# Patient Record
Sex: Male | Born: 1950 | Race: White | Hispanic: No | Marital: Single | Smoking: Former smoker
Health system: Southern US, Community
[De-identification: ages and names within clinical notes are randomized; demographics above are authoritative.]

## PROBLEM LIST (undated history)

## (undated) ENCOUNTER — Emergency Department (HOSPITAL_COMMUNITY): Payer: Self-pay

## (undated) DIAGNOSIS — I639 Cerebral infarction, unspecified: Secondary | ICD-10-CM

## (undated) DIAGNOSIS — IMO0002 Reserved for concepts with insufficient information to code with codable children: Secondary | ICD-10-CM

## (undated) DIAGNOSIS — I10 Essential (primary) hypertension: Secondary | ICD-10-CM

## (undated) DIAGNOSIS — E7800 Pure hypercholesterolemia, unspecified: Secondary | ICD-10-CM

## (undated) DIAGNOSIS — I739 Peripheral vascular disease, unspecified: Secondary | ICD-10-CM

## (undated) DIAGNOSIS — N189 Chronic kidney disease, unspecified: Secondary | ICD-10-CM

## (undated) DIAGNOSIS — M792 Neuralgia and neuritis, unspecified: Secondary | ICD-10-CM

## (undated) DIAGNOSIS — M1990 Unspecified osteoarthritis, unspecified site: Secondary | ICD-10-CM

## (undated) DIAGNOSIS — L409 Psoriasis, unspecified: Secondary | ICD-10-CM

## (undated) DIAGNOSIS — G709 Myoneural disorder, unspecified: Secondary | ICD-10-CM

## (undated) DIAGNOSIS — E119 Type 2 diabetes mellitus without complications: Secondary | ICD-10-CM

## (undated) HISTORY — DX: Unspecified osteoarthritis, unspecified site: M19.90

## (undated) HISTORY — PX: POPLITEAL ARTERY STENT: SHX2243

## (undated) HISTORY — DX: Essential (primary) hypertension: I10

## (undated) HISTORY — DX: Reserved for concepts with insufficient information to code with codable children: IMO0002

## (undated) HISTORY — DX: Neuralgia and neuritis, unspecified: M79.2

## (undated) HISTORY — PX: TONSILLECTOMY AND ADENOIDECTOMY: SUR1326

## (undated) HISTORY — DX: Peripheral vascular disease, unspecified: I73.9

## (undated) HISTORY — DX: Cerebral infarction, unspecified: I63.9

## (undated) HISTORY — DX: Psoriasis, unspecified: L40.9

---

## 1997-01-26 DIAGNOSIS — I639 Cerebral infarction, unspecified: Secondary | ICD-10-CM

## 1997-01-26 HISTORY — DX: Cerebral infarction, unspecified: I63.9

## 2012-03-04 ENCOUNTER — Other Ambulatory Visit: Payer: Self-pay | Admitting: *Deleted

## 2012-03-04 DIAGNOSIS — L97909 Non-pressure chronic ulcer of unspecified part of unspecified lower leg with unspecified severity: Secondary | ICD-10-CM

## 2012-03-04 DIAGNOSIS — I739 Peripheral vascular disease, unspecified: Secondary | ICD-10-CM

## 2012-03-08 ENCOUNTER — Other Ambulatory Visit: Payer: Self-pay | Admitting: *Deleted

## 2012-03-08 DIAGNOSIS — I739 Peripheral vascular disease, unspecified: Secondary | ICD-10-CM

## 2012-03-08 DIAGNOSIS — L97909 Non-pressure chronic ulcer of unspecified part of unspecified lower leg with unspecified severity: Secondary | ICD-10-CM

## 2012-03-14 ENCOUNTER — Encounter: Payer: Self-pay | Admitting: Surgery

## 2012-03-18 ENCOUNTER — Encounter: Payer: Self-pay | Admitting: Surgery

## 2012-03-21 ENCOUNTER — Ambulatory Visit (INDEPENDENT_AMBULATORY_CARE_PROVIDER_SITE_OTHER): Payer: Self-pay | Admitting: Surgery

## 2012-03-21 ENCOUNTER — Encounter: Payer: Self-pay | Admitting: Surgery

## 2012-03-21 ENCOUNTER — Encounter (INDEPENDENT_AMBULATORY_CARE_PROVIDER_SITE_OTHER): Payer: Self-pay | Admitting: *Deleted

## 2012-03-21 VITALS — BP 173/85 | HR 88 | Resp 16 | Ht 68.0 in | Wt 234.0 lb

## 2012-03-21 DIAGNOSIS — I7025 Atherosclerosis of native arteries of other extremities with ulceration: Secondary | ICD-10-CM | POA: Insufficient documentation

## 2012-03-21 NOTE — Progress Notes (Signed)
[facility] [place]   Patient name: James Dougherty MRN: [id number] DOB: 09-06-1950 Sex: male   Referred by: Dr. Tanda Rockers  Reason for referral:  Chief Complaint  Patient presents with  . New Evaluation    Non-healing ulcer left lateral leg, Ref. by Dr. Tanda Rockers.  C/O Pain in bilateral legs with walking and at rest , duration 4 mo.  Weakness in arms and legs , 1999.    HISTORY OF PRESENT ILLNESS: This is a 62 year old gentleman who was referred for evaluation of a nonhealing left leg ulcer. The patient states that he has had a wound on his left lateral leg since October of last year. He hasn't been performing wound care since that time. He has had similar wound on the right leg which have healed.  The patient suffered a stroke in 1999. He is left with some speech and memory difficulties. He is medically managed for his diabetes with oral agents. His blood sugars run in the 140-200 range. He does have issues with psoriasis.  Past Medical History  Diagnosis Date  . Diabetes mellitus without complication   . Hypertension   . Psoriasis   . Arthritis   . PAD (peripheral artery disease)   . Neuropathic pain   . Ulcer     Left ankle/leg  . Stroke 1999    Past Surgical History  Procedure Laterality Date  . Tonsillectomy and adenoidectomy      History   Social History  . Marital Status: Single    Spouse Name: N/A    Number of Children: N/A  . Years of Education: N/A   Occupational History  . Not on file.   Social History Main Topics  . Smoking status: Former Smoker    Quit date: 01/07/2011  . Smokeless tobacco: Never Used  . Alcohol Use: No  . Drug Use: No  . Sexually Active: Not on file   Other Topics Concern  . Not on file   Social History Narrative  . No narrative on file    Family History  Problem Relation Age of Onset  . Diabetes Sister     Bilateral amputation of lower legs  . Diabetes Sister   . Heart disease Sister 71   Heart disease before age 63  . Hypertension Sister   . Heart attack Sister   . Hypertension Daughter     Allergies as of 03/21/2012 - Review Complete 03/21/2012  Allergen Reaction Noted  . Bactrim (sulfamethoxazole w-trimethoprim) Rash 03/14/2012    Current Outpatient Prescriptions on File Prior to Visit  Medication Sig Dispense Refill  . aspirin 81 MG tablet Take 81 mg by mouth daily.      [name] gabapentin (NEURONTIN) 600 MG tablet Take 600 mg by mouth daily.      [name] lisinopril (PRINIVIL,ZESTRIL) 20 MG tablet Take 20 mg by mouth daily.      . metFORMIN (GLUCOPHAGE) 500 MG tablet Take 500 mg by mouth 4 (four) times daily - after meals and at bedtime.      Sunny Schlein Tar Extract [phone] PSORIASIS MEDICATED EX) Apply topically.      . mupirocin cream (BACTROBAN) 2 % Apply topically daily.       No current facility-administered medications on file prior to visit.     REVIEW OF SYSTEMS: Cardiovascular: Positive for pain in legs with walking and lying flat. Positive for swelling in his legs. Pulmonary: No productive cough, asthma or wheezing. Neurologic: Positive for stroke. He has weakness  in his arms and legs as well as numbness. Hematologic: No bleeding problems or clotting disorders. Musculoskeletal: No joint pain or joint swelling. Gastrointestinal: No blood in stool or hematemesis Genitourinary: No dysuria or hematuria. Psychiatric:: No history of major depression. Integumentary: Left leg ulcer Constitutional: No fever or chills.  PHYSICAL EXAMINATION: General: The patient appears their stated age.  Vital signs are BP 173/85  Pulse 88  Resp 16  Ht 5\' 8"  (1.727 m)  Wt 234 lb (106.142 kg)  BMI 35.59 kg/m2  SpO2 98% HEENT:  No gross abnormalities Pulmonary: Respirations are non-labored Abdomen: Soft and non-tender  Musculoskeletal: There are no major deformities.   Neurologic: No focal weakness or paresthesias are detected, Skin: 2 x 2.5 cm ulcer on the lateral side of the left  leg just above the malleolus. No surrounding erythema. Psychiatric: The patient has normal affect. Cardiovascular: There is a regular rate and rhythm without significant murmur appreciated. Bilateral carotid bruits, left greater than right. Pedal pulses are not palpable.  Diagnostic Studies: Ultrasound was performed today which shows an ABI on the right a 0.45, and an ABI of 0.42 on the left. Left distal superficial femoral artery occlusion is noted on the left.  The patient also has an outside carotid duplex which shows greater than 70% stenosis bilaterally. The peak end diastolic velocity on the right is 79 and on the left is 57  Outside Studies/Documentation Historical records were reviewed.  They showed vascular disease with nonhealing left lower leg ulcer  Medication Changes: None  Assessment:  #1 bilateral carotid stenosis, asymptomatic #2: Peripheral vascular disease with ulcer, left leg Plan: #1: The patient is asymptomatic from his carotid disease. Based on the numeric values from the outside carotid duplex, I do not feel that his degree of stenosis is greater than 80% as the highest end-diastolic velocity identified on the report is 79. This will need to be followed over time. I'll repeat his imaging in our office in the future.  #2: Peripheral vascular disease with ulcer: The patient has a nonhealing wound on his left lateral lower leg. I discussed that this is a limb threatening situation. I discussed the need to proceed with revascularization in order to assist with wound healing. In order to this he will need to undergo angiography. At that time he potentially could be a candidate for a percutaneous intervention. If he is not, he'll need surgical revascularization. The risks and benefits were discussed with the patient. I scheduled his procedure for Tuesday, March 4     V. Charlena Cross, M.D. [facility] [place] Office: [phone] Pager:   [phone]

## 2012-03-22 ENCOUNTER — Other Ambulatory Visit: Payer: Self-pay

## 2012-03-23 ENCOUNTER — Encounter (HOSPITAL_COMMUNITY): Payer: Self-pay | Admitting: Pharmacy Technician

## 2012-03-28 MED ORDER — SODIUM CHLORIDE 0.9 % IV SOLN: INTRAVENOUS | Status: DC

## 2012-03-29 ENCOUNTER — Encounter (HOSPITAL_COMMUNITY): Admission: RE | Payer: Self-pay | Source: Ambulatory Visit

## 2012-03-29 ENCOUNTER — Ambulatory Visit (HOSPITAL_COMMUNITY): Admission: RE | Admit: 2012-03-29 | Payer: Self-pay | Source: Ambulatory Visit | Admitting: Surgery

## 2012-03-29 SURGERY — ABDOMINAL AORTAGRAM
Anesthesia: LOCAL

## 2012-03-30 ENCOUNTER — Ambulatory Visit (HOSPITAL_COMMUNITY)
Admission: RE | Admit: 2012-03-30 | Discharge: 2012-03-30 | Disposition: A | Discharge status: Home / Self Care | Payer: Self-pay | Source: Ambulatory Visit | Attending: Surgery | Admitting: Surgery

## 2012-03-30 ENCOUNTER — Other Ambulatory Visit: Payer: Self-pay

## 2012-03-30 ENCOUNTER — Telehealth: Payer: Self-pay | Admitting: Surgery

## 2012-03-30 ENCOUNTER — Other Ambulatory Visit: Payer: Self-pay | Admitting: *Deleted

## 2012-03-30 ENCOUNTER — Encounter (HOSPITAL_COMMUNITY)
Admission: RE | Disposition: A | Discharge status: Home / Self Care | Payer: Self-pay | Source: Ambulatory Visit | Attending: Surgery

## 2012-03-30 DIAGNOSIS — Z8673 Personal history of transient ischemic attack (TIA), and cerebral infarction without residual deficits: Secondary | ICD-10-CM | POA: Insufficient documentation

## 2012-03-30 DIAGNOSIS — L98499 Non-pressure chronic ulcer of skin of other sites with unspecified severity: Secondary | ICD-10-CM

## 2012-03-30 DIAGNOSIS — I739 Peripheral vascular disease, unspecified: Secondary | ICD-10-CM

## 2012-03-30 DIAGNOSIS — E119 Type 2 diabetes mellitus without complications: Secondary | ICD-10-CM | POA: Insufficient documentation

## 2012-03-30 DIAGNOSIS — L97809 Non-pressure chronic ulcer of other part of unspecified lower leg with unspecified severity: Secondary | ICD-10-CM | POA: Insufficient documentation

## 2012-03-30 HISTORY — PX: ABDOMINAL AORTAGRAM: SHX5454

## 2012-03-30 HISTORY — PX: FEMORAL ARTERY STENT: SHX1583

## 2012-03-30 LAB — POCT ACTIVATED CLOTTING TIME: Activated Clotting Time: 214 seconds

## 2012-03-30 LAB — POCT I-STAT, CHEM 8
BUN: 15 mg/dL (ref 6–23)
Calcium, Ion: 1.18 mmol/L (ref 1.13–1.30)
Creatinine, Ser: 1.1 mg/dL (ref 0.50–1.35)
Hemoglobin: 15 g/dL (ref 13.0–17.0)
TCO2: 25 mmol/L (ref 0–100)

## 2012-03-30 LAB — GLUCOSE, CAPILLARY: Glucose-Capillary: 223 mg/dL — ABNORMAL HIGH (ref 70–99)

## 2012-03-30 SURGERY — ABDOMINAL AORTAGRAM
Anesthesia: LOCAL

## 2012-03-30 MED ORDER — ALUM & MAG HYDROXIDE-SIMETH 200-200-20 MG/5ML PO SUSP: 15.0000 mL | ORAL | Status: DC | PRN

## 2012-03-30 MED ORDER — PHENOL 1.4 % MT LIQD: 1.0000 | OROMUCOSAL | Status: DC | PRN

## 2012-03-30 MED ORDER — CLOPIDOGREL BISULFATE 75 MG PO TABS: 75.0000 mg | ORAL_TABLET | Freq: Every day | ORAL | Status: DC

## 2012-03-30 MED ORDER — LABETALOL HCL 5 MG/ML IV SOLN: 10.0000 mg | INTRAVENOUS | Status: DC | PRN

## 2012-03-30 MED ORDER — CLOPIDOGREL BISULFATE 300 MG PO TABS: 300.0000 mg | ORAL_TABLET | Freq: Once | ORAL | Status: DC

## 2012-03-30 MED ORDER — SODIUM CHLORIDE 0.9 % IV SOLN: 1.0000 mL/kg/h | INTRAVENOUS | Status: DC

## 2012-03-30 MED ORDER — HEPARIN SODIUM (PORCINE) 1000 UNIT/ML IJ SOLN
INTRAMUSCULAR | Status: AC
  Filled 2012-03-30: qty 1

## 2012-03-30 MED ORDER — ACETAMINOPHEN 325 MG RE SUPP: 325.0000 mg | RECTAL | Status: DC | PRN

## 2012-03-30 MED ORDER — MORPHINE SULFATE 10 MG/ML IJ SOLN: 2.0000 mg | INTRAMUSCULAR | Status: DC | PRN

## 2012-03-30 MED ORDER — METOPROLOL TARTRATE 1 MG/ML IV SOLN: 2.0000 mg | INTRAVENOUS | Status: DC | PRN

## 2012-03-30 MED ORDER — INSULIN ASPART 100 UNIT/ML ~~LOC~~ SOLN
8.0000 [IU] | Freq: Once | SUBCUTANEOUS | Status: AC
  Administered 2012-03-30: 8 [IU] via SUBCUTANEOUS

## 2012-03-30 MED ORDER — CLOPIDOGREL BISULFATE 300 MG PO TABS
ORAL_TABLET | ORAL | Status: AC
  Filled 2012-03-30: qty 1

## 2012-03-30 MED ORDER — ACETAMINOPHEN 325 MG PO TABS: 325.0000 mg | ORAL_TABLET | ORAL | Status: DC | PRN

## 2012-03-30 MED ORDER — FENTANYL CITRATE 0.05 MG/ML IJ SOLN
INTRAMUSCULAR | Status: AC
  Filled 2012-03-30: qty 2

## 2012-03-30 MED ORDER — GUAIFENESIN-DM 100-10 MG/5ML PO SYRP: 15.0000 mL | ORAL_SOLUTION | ORAL | Status: DC | PRN

## 2012-03-30 MED ORDER — OXYCODONE HCL 5 MG PO TABS: 5.0000 mg | ORAL_TABLET | ORAL | Status: DC | PRN

## 2012-03-30 MED ORDER — ONDANSETRON HCL 4 MG/2ML IJ SOLN: 4.0000 mg | Freq: Four times a day (QID) | INTRAMUSCULAR | Status: DC | PRN

## 2012-03-30 MED ORDER — LIDOCAINE HCL (PF) 1 % IJ SOLN
INTRAMUSCULAR | Status: AC
  Filled 2012-03-30: qty 30

## 2012-03-30 MED ORDER — HYDRALAZINE HCL 20 MG/ML IJ SOLN: 10.0000 mg | INTRAMUSCULAR | Status: DC | PRN

## 2012-03-30 NOTE — Op Note (Addendum)
[facility] [place]  Patient name: Seichi Kaufhold MRN: [id number] DOB: [birth year] Sex: male  03/30/2012 Pre-operative Diagnosis: Left lower extremity ulcer Post-operative diagnosis:  Same Surgeon:  Jorge Ny Procedure Performed:  1.  ultrasound-guided access, right femoral artery  2.  abdominal aortogram  3.  left lower extremity runoff  4.  stent, left superficial femoral and popliteal artery    Indications:  The patient has a chronic left lower extremity wound. Ultrasound revealed an occluded superficial femoral artery. He comes in today for further evaluation and possible revascularization.  Procedure:  The patient was identified in the holding area and taken to room 8.  The patient was then placed supine on the table and prepped and draped in the usual sterile fashion.  A time out was called.  Ultrasound was used to evaluate the right common femoral artery.  It was patent .  A digital ultrasound image was acquired.  A micropuncture needle was used to access the right common femoral artery under ultrasound guidance.  An 018 wire was advanced without resistance and a micropuncture sheath was placed.  The 018 wire was removed and a benson wire was placed.  The micropuncture sheath was exchanged for a 5 french sheath.  An omniflush catheter was advanced over the wire to the level of L-1.  An abdominal angiogram was obtained.  Next, using the omniflush catheter and a benson wire, the aortic bifurcation was crossed and the catheter was placed into theleft external iliac artery and left runoff was obtained.  Findings:   Aortogram:  The visualized portions of the suprarenal abdominal aorta showed no significant stenosis. There is 1 right renal artery with an early branch which is widely patent. There are 2 left renal arteries. The most superior has a significant stenosis. The infrarenal abdominal aorta is widely patent. Bilateral common and external iliac  arteries are widely patent. The distal branches of the internal iliac arteries bilaterally show significant  diffuse disease and stenosis  Left Lower Extremity:  The left common femoral artery is widely patent. Left profunda femoral artery is widely patent. There is a high-grade stenosis at the origin of the left superficial femoral artery which then occludes. There is reconstitution of the above-knee popliteal artery. The popliteal artery below the knee is widely patent. The dominant runoff is through the posterior tibial artery which crosses the ankle. The peroneal artery is also patent down to the ankle.  Right Lower Extremity:  Not performed due to 2 total administration of contrast  Intervention:  After the above images were obtained, the decision was made to proceed with intervention. Over an Amplatz superstiff wire a 7 [place] Ansel 1 sheath was advanced into the left external iliac artery. The patient was fully heparinized. Using a quick cross catheter and a Glidewire, subintimal recanalization was performed. Reentry was in the above-knee popliteal artery. A contrast injection was performed with the catheter at this level confirming successful crossing of the lesion. I tried to advance a woolly wire into the below knee popliteal artery however I met resistance near the patella. I ultimately got the Glidewire to get across this area. The subintimal tract was predilated with a 4 mm balloon. I then proceeded with placing overlapping 6 mm stents across the previously occluded segment. The first was a EV-3 6 x 1 50 followed by a second 6 x 1 50. I then had to treat the ostium of the left superficial femoral artery I did this with  a Abbott 6 x 60 stent. The stents were molded using a 5 mm balloon. I also dilated the proximal stent with a 6 mm balloon. Completion angiography revealed widely patent superficial femoral popliteal artery with runoff similar to 3 intervention. There was a small area of dissection in  the popliteal artery behind the patella which coursed onto to where I had trouble getting the wire below the knee. I did not feel this was flow-limiting and elected not to treat this at this time. The patient tolerated the procedure well. The catheters and wires were removed. The sheath was withdrawn to the right external iliac artery. He'll be taken to the holding area for sheath pull once his coagulation profile corrects  Impression:  #1  successful recanalization of an occluded left superficial femoral and popliteal artery with subsequent stent deployment using 6 mm overlapping stents.   Juleen China, M.D. [facility] [place] Office: [phone] Pager:  [phone]

## 2012-03-30 NOTE — Telephone Encounter (Addendum)
Message copied by Shari Prows on Wed Mar 30, 2012  2:08 PM ------      Message from: Melene Plan      Created: Wed Mar 30, 2012 11:29 AM                   ----- Message -----         From: Nada Libman, MD         Sent: 03/30/2012  10:19 AM           To: Reuel Derby, Melene Plan, RN            03/30/2012:            Procedure Performed:       1.  ultrasound-guided access, right femoral artery       2.  abdominal aortogram       3.  left lower extremity runoff       4.  stent, left superficial femoral and popliteal artery                  Please schedule to come back for followup in one month with a duplex ultrasound and ABIs of his left leg       ------ I scheduled an appt for the above patient on 04/25/12 at 2:30pm for vascular studies and to see V[facility] at 3:45pm. I mailed a letter to the patient and also called and left a message for him.awt

## 2012-03-30 NOTE — Interval H&P Note (Signed)
History and Physical Interval Note:  03/30/2012 7:10 AM  James Dougherty  has presented today for surgery, with the diagnosis of PVD  The various methods of treatment have been discussed with the patient and family. After consideration of risks, benefits and other options for treatment, the patient has consented to  Procedure(s): ABDOMINAL AORTAGRAM (N/A) as a surgical intervention .  The patient's history has been reviewed, patient examined, no change in status, stable for surgery.  I have reviewed the patient's chart and labs.  Questions were answered to the patient's satisfaction.     BRABHAM IV, V. WELLS

## 2012-03-30 NOTE — H&P (View-Only) (Signed)
[facility] Specialist of [place]   Patient name: James Dougherty MRN: [id number] DOB: 10/10/1950 Sex: male   Referred by: Dr. Nichols  Reason for referral:  Chief Complaint  Patient presents with  . New Evaluation    Non-healing ulcer left lateral leg, Ref. by Dr. Nichols.  C/O Pain in bilateral legs with walking and at rest , duration 4 mo.  Weakness in arms and legs , 1999.    HISTORY OF PRESENT ILLNESS: This is a 61-year-old gentleman who was referred for evaluation of a nonhealing left leg ulcer. The patient states that he has had a wound on his left lateral leg since October of last year. He hasn't been performing wound care since that time. He has had similar wound on the right leg which have healed.  The patient suffered a stroke in 1999. He is left with some speech and memory difficulties. He is medically managed for his diabetes with oral agents. His blood sugars run in the 140-200 range. He does have issues with psoriasis.  Past Medical History  Diagnosis Date  . Diabetes mellitus without complication   . Hypertension   . Psoriasis   . Arthritis   . PAD (peripheral artery disease)   . Neuropathic pain   . Ulcer     Left ankle/leg  . Stroke 1999    Past Surgical History  Procedure Laterality Date  . Tonsillectomy and adenoidectomy      History   Social History  . Marital Status: Single    Spouse Name: N/A    Number of Children: N/A  . Years of Education: N/A   Occupational History  . Not on file.   Social History Main Topics  . Smoking status: Former Smoker    Quit date: 01/07/2011  . Smokeless tobacco: Never Used  . Alcohol Use: No  . Drug Use: No  . Sexually Active: Not on file   Other Topics Concern  . Not on file   Social History Narrative  . No narrative on file    Family History  Problem Relation Age of Onset  . Diabetes Sister     Bilateral amputation of lower legs  . Diabetes Sister   . Heart disease Sister 57   Heart disease before age 60  . Hypertension Sister   . Heart attack Sister   . Hypertension Daughter     Allergies as of 03/21/2012 - Review Complete 03/21/2012  Allergen Reaction Noted  . Bactrim (sulfamethoxazole w-trimethoprim) Rash 03/14/2012    Current Outpatient Prescriptions on File Prior to Visit  Medication Sig Dispense Refill  . aspirin 81 MG tablet Take 81 mg by mouth daily.      . gabapentin (NEURONTIN) 600 MG tablet Take 600 mg by mouth daily.      . lisinopril (PRINIVIL,ZESTRIL) 20 MG tablet Take 20 mg by mouth daily.      . metFORMIN (GLUCOPHAGE) 500 MG tablet Take 500 mg by mouth 4 (four) times daily - after meals and at bedtime.      . Coal Tar Extract (MG217 PSORIASIS MEDICATED EX) Apply topically.      . mupirocin cream (BACTROBAN) 2 % Apply topically daily.       No current facility-administered medications on file prior to visit.     REVIEW OF SYSTEMS: Cardiovascular: Positive for pain in legs with walking and lying flat. Positive for swelling in his legs. Pulmonary: No productive cough, asthma or wheezing. Neurologic: Positive for stroke. He has weakness   in his arms and legs as well as numbness. Hematologic: No bleeding problems or clotting disorders. Musculoskeletal: No joint pain or joint swelling. Gastrointestinal: No blood in stool or hematemesis Genitourinary: No dysuria or hematuria. Psychiatric:: No history of major depression. Integumentary: Left leg ulcer Constitutional: No fever or chills.  PHYSICAL EXAMINATION: General: The patient appears their stated age.  Vital signs are BP 173/85  Pulse 88  Resp 16  Ht 5' 8" (1.727 m)  Wt 234 lb (106.142 kg)  BMI 35.59 kg/m2  SpO2 98% HEENT:  No gross abnormalities Pulmonary: Respirations are non-labored Abdomen: Soft and non-tender  Musculoskeletal: There are no major deformities.   Neurologic: No focal weakness or paresthesias are detected, Skin: 2 x 2.5 cm ulcer on the lateral side of the left  leg just above the malleolus. No surrounding erythema. Psychiatric: The patient has normal affect. Cardiovascular: There is a regular rate and rhythm without significant murmur appreciated. Bilateral carotid bruits, left greater than right. Pedal pulses are not palpable.  Diagnostic Studies: Ultrasound was performed today which shows an ABI on the right a 0.45, and an ABI of 0.42 on the left. Left distal superficial femoral artery occlusion is noted on the left.  The patient also has an outside carotid duplex which shows greater than 70% stenosis bilaterally. The peak end diastolic velocity on the right is 79 and on the left is 57  Outside Studies/Documentation Historical records were reviewed.  They showed vascular disease with nonhealing left lower leg ulcer  Medication Changes: None  Assessment:  #1 bilateral carotid stenosis, asymptomatic #2: Peripheral vascular disease with ulcer, left leg Plan: #1: The patient is asymptomatic from his carotid disease. Based on the numeric values from the outside carotid duplex, I do not feel that his degree of stenosis is greater than 80% as the highest end-diastolic velocity identified on the report is 79. This will need to be followed over time. I'll repeat his imaging in our office in the future.  #2: Peripheral vascular disease with ulcer: The patient has a nonhealing wound on his left lateral lower leg. I discussed that this is a limb threatening situation. I discussed the need to proceed with revascularization in order to assist with wound healing. In order to this he will need to undergo angiography. At that time he potentially could be a candidate for a percutaneous intervention. If he is not, he'll need surgical revascularization. The risks and benefits were discussed with the patient. I scheduled his procedure for Tuesday, March 4     V. Wells Jaelie Aguilera IV, M.D. [facility] [place] Office: [phone] Pager:   [phone]   

## 2012-04-06 DIAGNOSIS — L98499 Non-pressure chronic ulcer of skin of other sites with unspecified severity: Secondary | ICD-10-CM

## 2012-04-06 DIAGNOSIS — I739 Peripheral vascular disease, unspecified: Secondary | ICD-10-CM

## 2012-04-25 ENCOUNTER — Ambulatory Visit: Payer: Self-pay | Admitting: Surgery

## 2012-06-03 ENCOUNTER — Encounter: Payer: Self-pay | Admitting: Surgery

## 2012-06-06 ENCOUNTER — Encounter (INDEPENDENT_AMBULATORY_CARE_PROVIDER_SITE_OTHER): Payer: BC Managed Care – PPO | Admitting: Vascular Surgery

## 2012-06-06 ENCOUNTER — Ambulatory Visit (INDEPENDENT_AMBULATORY_CARE_PROVIDER_SITE_OTHER): Payer: BC Managed Care – PPO | Admitting: Surgery

## 2012-06-06 ENCOUNTER — Encounter: Payer: Self-pay | Admitting: Surgery

## 2012-06-06 VITALS — BP 139/61 | HR 82 | Ht 68.0 in | Wt 233.3 lb

## 2012-06-06 DIAGNOSIS — L98499 Non-pressure chronic ulcer of skin of other sites with unspecified severity: Secondary | ICD-10-CM

## 2012-06-06 DIAGNOSIS — I739 Peripheral vascular disease, unspecified: Secondary | ICD-10-CM

## 2012-06-06 DIAGNOSIS — Z48812 Encounter for surgical aftercare following surgery on the circulatory system: Secondary | ICD-10-CM

## 2012-06-06 NOTE — Progress Notes (Signed)
[facility] of [place]   Patient name: James Dougherty MRN: [id number] DOB: July 07, 1950 Sex: male     Chief Complaint  Patient presents with  . Re-evaluation    stent left femoral /popliteal artery - 1 month f/u    HISTORY OF PRESENT ILLNESS: This is a 62 year old gentleman who was referred for evaluation of a nonhealing left leg ulcer. The patient states that he has had a wound on his left lateral leg since October of last year. He is now getting wound care the wound center. On 03/30/2012, the patient underwent angiography. He was found to have an occluded superficial femoral artery. He underwent successful recanalization of the occluded superficial femoral and popliteal artery with subsequent stent deployment using 6 mm stents. He is back today for followup. He continues to his wound care at the wound center. He has no complaints today.   Past Medical History  Diagnosis Date  . Diabetes mellitus without complication   . Hypertension   . Psoriasis   . Arthritis   . PAD (peripheral artery disease)   . Neuropathic pain   . Ulcer     Left ankle/leg  . Stroke 1999    Past Surgical History  Procedure Laterality Date  . Tonsillectomy and adenoidectomy    . Popliteal artery stent    . Femoral artery stent  03/30/2012    History   Social History  . Marital Status: Single    Spouse Name: N/A    Number of Children: N/A  . Years of Education: N/A   Occupational History  . Not on file.   Social History Main Topics  . Smoking status: Former Smoker    Quit date: 01/07/2011  . Smokeless tobacco: Never Used  . Alcohol Use: No  . Drug Use: No  . Sexually Active: Not on file   Other Topics Concern  . Not on file   Social History Narrative  . No narrative on file    Family History  Problem Relation Age of Onset  . Diabetes Sister     Bilateral amputation of lower legs  . Diabetes Sister   . Heart disease Sister 70    Heart disease before age  59  . Hypertension Sister   . Heart attack Sister   . Hypertension Daughter     Allergies as of 06/06/2012 - Review Complete 06/06/2012  Allergen Reaction Noted  . Bactrim (sulfamethoxazole w-trimethoprim) Rash 03/14/2012    Current Outpatient Prescriptions on File Prior to Visit  Medication Sig Dispense Refill  . aspirin 81 MG tablet Take 81 mg by mouth daily.      [name] gabapentin (NEURONTIN) 600 MG tablet Take 600 mg by mouth daily.      [name] HYDROcodone-acetaminophen (NORCO/VICODIN) 5-325 MG per tablet Take 0.5 tablets by mouth 3 (three) times a week.      [name] lisinopril (PRINIVIL,ZESTRIL) 20 MG tablet Take 20 mg by mouth daily.      . metFORMIN (GLUCOPHAGE) 500 MG tablet Take 500 mg by mouth 4 (four) times daily - after meals and at bedtime.      . clobetasol ointment (TEMOVATE) 0.05 % Apply 1 application topically 2 (two) times daily.      Sunny Schlein Tar Extract [phone] PSORIASIS MEDICATED EX) Apply topically.      . mupirocin ointment (BACTROBAN) 2 % Apply 1 application topically 3 (three) times daily.       No current facility-administered medications on file prior to visit.  REVIEW OF SYSTEMS: Please see history of present illness, otherwise all systems negative  PHYSICAL EXAMINATION:   Vital signs are BP 139/61  Pulse 82  Ht 5\' 8"  (1.727 m)  Wt 233 lb 4.8 oz (105.824 kg)  BMI 35.48 kg/m2  SpO2 95% General: The patient appears their stated age. HEENT:  No gross abnormalities Pulmonary:  Non labored breathing Musculoskeletal: There are no major deformities. Neurologic: No focal weakness or paresthesias are detected, Skin: 3 x 2.5 ulcer on the lateral left leg just above the malleolus with beefy red granulation tissue and minimal biofilm  Psychiatric: The patient has normal affect. Cardiovascular: Pedal pulses are not palpable   Diagnostic Studies ABI today is increased on the left innominate are 0.72 which is up from 0.42. On the right the ABI 0.53. Duplex ultrasound shows  widely patent stent within the superficial femoral-popliteal artery  Assessment: Peripheral vascular disease with ulceration, left lower extremity Plan: It is a little troubling that despite revascularization the patient's wound has actually increased in size from my initial evaluation. Unfortunately I do not see any evidence of infection in the wound bed itself actually looks very healthy. I have recommended that he continue to get wound care at the wound center. I have scheduled him to come back in 3 months. I'll repeat his duplex ultrasound at that time. If the wound continues to not show significant progress, I may need to repeat his arteriogram to confirm that nothing further needs to be done to improve his blood flow.  Jorge Ny, M.D. [facility] [place] Office: [phone] Pager:  [phone]

## 2012-06-06 NOTE — Addendum Note (Signed)
Addended by: Adria Dill L on: 06/06/2012 04:57 PM   Modules accepted: Orders

## 2012-06-09 DIAGNOSIS — I739 Peripheral vascular disease, unspecified: Secondary | ICD-10-CM

## 2012-06-09 DIAGNOSIS — L98499 Non-pressure chronic ulcer of skin of other sites with unspecified severity: Secondary | ICD-10-CM

## 2012-09-02 ENCOUNTER — Encounter: Payer: Self-pay | Admitting: Surgery

## 2012-09-05 ENCOUNTER — Other Ambulatory Visit: Payer: Self-pay | Admitting: *Deleted

## 2012-09-05 ENCOUNTER — Encounter (INDEPENDENT_AMBULATORY_CARE_PROVIDER_SITE_OTHER): Payer: BC Managed Care – PPO | Admitting: *Deleted

## 2012-09-05 ENCOUNTER — Ambulatory Visit (INDEPENDENT_AMBULATORY_CARE_PROVIDER_SITE_OTHER): Payer: BC Managed Care – PPO | Admitting: Surgery

## 2012-09-05 ENCOUNTER — Encounter: Payer: Self-pay | Admitting: Surgery

## 2012-09-05 VITALS — BP 143/60 | HR 81 | Temp 98.0°F | Ht 68.0 in | Wt 231.0 lb

## 2012-09-05 DIAGNOSIS — Z48812 Encounter for surgical aftercare following surgery on the circulatory system: Secondary | ICD-10-CM

## 2012-09-05 DIAGNOSIS — I739 Peripheral vascular disease, unspecified: Secondary | ICD-10-CM

## 2012-09-05 DIAGNOSIS — L98499 Non-pressure chronic ulcer of skin of other sites with unspecified severity: Secondary | ICD-10-CM

## 2012-09-05 NOTE — Progress Notes (Signed)
[facility] [place]   Patient name: James Dougherty MRN: [id number] DOB: 1950-05-24 Sex: male     Chief Complaint  Patient presents with  . Re-evaluation    3 month f/u pt states that he has 2 new wounds on his left leg    HISTORY OF PRESENT ILLNESS: This is a 62 year old gentleman who was referred for evaluation of a nonhealing left leg ulcer. The patient states that he has had a wound on his left lateral leg since October of last year. He is now getting wound care the wound center. On 03/30/2012, the patient underwent angiography. He was found to have an occluded superficial femoral artery. He underwent successful recanalization of the occluded superficial femoral and popliteal artery with subsequent stent deployment using 6 mm stents. He is back today for followup. He continues to his wound care at the wound center. Fortunately, he was able to heal the large wound on the lateral side of his foot. Unfortunately, he developed a new wound on the left lateral calf and 2 new areas on the left medial calf, all of which measure approximately 1 cm.   Past Medical History  Diagnosis Date  . Diabetes mellitus without complication   . Hypertension   . Psoriasis   . Arthritis   . PAD (peripheral artery disease)   . Neuropathic pain   . Ulcer     Left ankle/leg  . Stroke 1999    Past Surgical History  Procedure Laterality Date  . Tonsillectomy and adenoidectomy    . Popliteal artery stent    . Femoral artery stent  03/30/2012    History   Social History  . Marital Status: Single    Spouse Name: N/A    Number of Children: N/A  . Years of Education: N/A   Occupational History  . Not on file.   Social History Main Topics  . Smoking status: Former Smoker    Quit date: 01/07/2011  . Smokeless tobacco: Never Used  . Alcohol Use: No  . Drug Use: No  . Sexually Active: Not on file   Other Topics Concern  . Not on file   Social History Narrative  .  No narrative on file    Family History  Problem Relation Age of Onset  . Diabetes Sister     Bilateral amputation of lower legs  . Diabetes Sister   . Heart disease Sister 53    Heart disease before age 36  . Hypertension Sister   . Heart attack Sister   . Hypertension Daughter     Allergies as of 09/05/2012 - Review Complete 09/05/2012  Allergen Reaction Noted  . Bactrim (sulfamethoxazole w-trimethoprim) Rash 03/14/2012    Current Outpatient Prescriptions on File Prior to Visit  Medication Sig Dispense Refill  . aspirin 81 MG tablet Take 81 mg by mouth daily.      Sunny Schlein Tar Extract [phone] PSORIASIS MEDICATED EX) Apply topically.      . gabapentin (NEURONTIN) 600 MG tablet Take 600 mg by mouth daily.      [name] lisinopril (PRINIVIL,ZESTRIL) 20 MG tablet Take 20 mg by mouth daily.      . metFORMIN (GLUCOPHAGE) 500 MG tablet Take 500 mg by mouth 4 (four) times daily - after meals and at bedtime.      . mupirocin ointment (BACTROBAN) 2 % Apply 1 application topically 3 (three) times daily.      . clobetasol ointment (TEMOVATE) 0.05 % Apply 1 application  topically 2 (two) times daily.      . clopidogrel (PLAVIX) 75 MG tablet Take 75 mg by mouth daily.      [name] HYDROcodone-acetaminophen (NORCO/VICODIN) 5-325 MG per tablet Take 0.5 tablets by mouth 3 (three) times a week.       No current facility-administered medications on file prior to visit.     REVIEW OF SYSTEMS: Please see history of present illness, otherwise no changes from prior visit  PHYSICAL EXAMINATION:   Vital signs are BP 143/60  Pulse 81  Temp(Src) 98 F (36.7 C) (Oral)  Ht 5\' 8"  (1.727 m)  Wt 231 lb (104.781 kg)  BMI 35.13 kg/m2  SpO2 98% General: The patient appears their stated age. HEENT:  No gross abnormalities Pulmonary:  Non labored breathing Abdomen: Soft and non-tender Musculoskeletal: There are no major deformities. Neurologic: No focal weakness or paresthesias are detected, Skin: 1 cm with lateral  lower leg ulcer and to 1 cm medial leg ulcers with granulation tissue at its base and no significant erythema  Psychiatric: The patient has normal affect. Cardiovascular: Pedal pulses are nonpalpable   Diagnostic Studies Duplex was ordered and reviewed today. His ABIs have increased from 0.72-0.81 on the left. The right ABI is 0.67. Duplex of his stents feel an area of elevated velocity in the proximal stent, measuring 290 cm/s. In the distal stent, peak velocity of 280 cm/s.  Assessment: Left leg ulcer Plan: The patient has undergone a recanalization of an occluded superficial femoral artery. This enabled him to heal a large left leg ulcer. Unfortunately, he has developed 3 new wounds on the left leg. He is currently getting wound care the wound center. I consider proceeding with angiography to confirm that the stents remain patent. He does have a narrowing in the proximal stent, however it is less than 300 cm/s. Monophasic waveforms are seen throughout the stent. The patient would rather have this reintroduced in 3 months then proceeded with another arteriogram. Because no hemodynamically significant lesions are identified by ultrasound, I feel that this is reasonable, however if anything has progressed on his next ultrasound he'll need repeat angiography  V. Charlena Cross, M.D. [facility] [place] Office: [phone] Pager:  [phone]

## 2012-12-09 ENCOUNTER — Encounter: Payer: Self-pay | Admitting: Surgery

## 2012-12-12 ENCOUNTER — Ambulatory Visit (HOSPITAL_COMMUNITY)
Admission: RE | Admit: 2012-12-12 | Discharge: 2012-12-12 | Disposition: A | Discharge status: Home / Self Care | Payer: Medicaid Other | Source: Ambulatory Visit | Attending: Surgery | Admitting: Surgery

## 2012-12-12 ENCOUNTER — Encounter: Payer: Self-pay | Admitting: Surgery

## 2012-12-12 ENCOUNTER — Ambulatory Visit (INDEPENDENT_AMBULATORY_CARE_PROVIDER_SITE_OTHER): Payer: Medicaid Other | Admitting: Surgery

## 2012-12-12 ENCOUNTER — Ambulatory Visit (INDEPENDENT_AMBULATORY_CARE_PROVIDER_SITE_OTHER)
Admission: RE | Admit: 2012-12-12 | Discharge: 2012-12-12 | Disposition: A | Discharge status: Home / Self Care | Payer: Medicaid Other | Source: Ambulatory Visit | Attending: Surgery | Admitting: Surgery

## 2012-12-12 VITALS — BP 118/57 | HR 79 | Ht 68.0 in | Wt 237.8 lb

## 2012-12-12 DIAGNOSIS — Z48812 Encounter for surgical aftercare following surgery on the circulatory system: Secondary | ICD-10-CM | POA: Insufficient documentation

## 2012-12-12 DIAGNOSIS — I739 Peripheral vascular disease, unspecified: Secondary | ICD-10-CM

## 2012-12-12 NOTE — Progress Notes (Signed)
[facility]ist of [place]   Patient name: James Dougherty MRN: [id number] DOB: 1950-05-13 Sex: male     Chief Complaint  Patient presents with  . PVD    3 month f/u L LE ulceration    HISTORY OF PRESENT ILLNESS: This is a 62 year old gentleman who was referred for evaluation of a nonhealing left leg ulcer. The patient states that he has had a wound on his left lateral leg since October of last year. He is now getting wound care the wound center. On 03/30/2012, the patient underwent angiography. He was found to have an occluded superficial femoral artery. He underwent successful recanalization of the occluded superficial femoral and popliteal artery with subsequent stent deployment using 6 mm stents. He is back today for followup.  He has now healed all the ulcers in his left leg and has been discharged from the wound center.  He still complains of claudication but states that he can walk approximately 100 be further they could prior to intervention.   Past Medical History  Diagnosis Date  . Diabetes mellitus without complication   . Hypertension   . Psoriasis   . Arthritis   . PAD (peripheral artery disease)   . Neuropathic pain   . Ulcer     Left ankle/leg  . Stroke 1999    Past Surgical History  Procedure Laterality Date  . Tonsillectomy and adenoidectomy    . Popliteal artery stent    . Femoral artery stent  03/30/2012    History   Social History  . Marital Status: Single    Spouse Name: N/A    Number of Children: N/A  . Years of Education: N/A   Occupational History  . Not on file.   Social History Main Topics  . Smoking status: Former Smoker    Quit date: 01/07/2011  . Smokeless tobacco: Never Used  . Alcohol Use: No  . Drug Use: No  . Sexual Activity: Not on file   Other Topics Concern  . Not on file   Social History Narrative  . No narrative on file    Family History  Problem Relation Age of Onset  . Diabetes Sister    Bilateral amputation of lower legs  . Diabetes Sister   . Heart disease Sister 36    Heart disease before age 61  . Hypertension Sister   . Heart attack Sister   . Hypertension Daughter     Allergies as of 12/12/2012 - Review Complete 12/12/2012  Allergen Reaction Noted  . Bactrim [sulfamethoxazole-trimethoprim] Rash 03/14/2012    Current Outpatient Prescriptions on File Prior to Visit  Medication Sig Dispense Refill  . aspirin 81 MG tablet Take 81 mg by mouth daily.      [name] gabapentin (NEURONTIN) 600 MG tablet Take 600 mg by mouth daily.      [name] lisinopril (PRINIVIL,ZESTRIL) 20 MG tablet Take 20 mg by mouth daily.      . metFORMIN (GLUCOPHAGE) 500 MG tablet Take 500 mg by mouth 4 (four) times daily - after meals and at bedtime.      . clobetasol ointment (TEMOVATE) 0.05 % Apply 1 application topically 2 (two) times daily.      . clopidogrel (PLAVIX) 75 MG tablet Take 75 mg by mouth daily.      Sunny Schlein Tar Extract [phone] PSORIASIS MEDICATED EX) Apply topically.      [name] HYDROcodone-acetaminophen (NORCO/VICODIN) 5-325 MG per tablet Take 0.5 tablets by mouth 3 (three) times a  week.      . mupirocin ointment (BACTROBAN) 2 % Apply 1 application topically 3 (three) times daily.       No current facility-administered medications on file prior to visit.     REVIEW OF SYSTEMS: Please see history of present illness, otherwise no changes.  PHYSICAL EXAMINATION:   Vital signs are BP 118/57  Pulse 79  Ht 5\' 8"  (1.727 m)  Wt 237 lb 12.8 oz (107.865 kg)  BMI 36.17 kg/m2  SpO2 99% General: The patient appears their stated age. HEENT:  No gross abnormalities Pulmonary:  Non labored breathing Musculoskeletal: There are no major deformities. Neurologic: No focal weakness or paresthesias are detected, Skin: There are no ulcer or rashes noted. Psychiatric: The patient has normal affect. Cardiovascular: There is a regular rate and rhythm without significant murmur appreciated.   Diagnostic  Studies Duplex of the left leg was ordered and reviewed.  Today's ABI has decreased down to 0.7 from 0.8.  Velocity profile within the stent has also increased.  Peak velocity in the proximal stent is for 13 cm per sec  Assessment: Status post left leg percutaneous revascularization Plan: I had a lengthy conversation regarding the ultrasound findings today.  My strong feeling is that he needs to undergo angiography with further evaluation of the stent and treatment so as to avoid occlusion of his stents.  He had think about this for a long time because he did not want to go through another procedure.  I felt that if we go to proceed he is going to be at high risk for stent failure and potentially leave him worse than before.  He ultimately agreed to proceed.  His been scheduled for December 2.  Jorge Ny, M.D. [facility] [place] Office: [phone] Pager:  [phone]

## 2012-12-13 ENCOUNTER — Other Ambulatory Visit: Payer: Self-pay

## 2012-12-27 ENCOUNTER — Encounter (HOSPITAL_COMMUNITY)
Admission: RE | Disposition: A | Discharge status: Home / Self Care | Payer: Self-pay | Source: Ambulatory Visit | Attending: Surgery

## 2012-12-27 ENCOUNTER — Telehealth: Payer: Self-pay | Admitting: Surgery

## 2012-12-27 ENCOUNTER — Other Ambulatory Visit: Payer: Self-pay | Admitting: *Deleted

## 2012-12-27 ENCOUNTER — Encounter (HOSPITAL_COMMUNITY): Payer: Self-pay | Admitting: Pharmacy Technician

## 2012-12-27 ENCOUNTER — Ambulatory Visit (HOSPITAL_COMMUNITY)
Admission: RE | Admit: 2012-12-27 | Discharge: 2012-12-27 | Disposition: A | Discharge status: Home / Self Care | Payer: Medicaid Other | Source: Ambulatory Visit | Attending: Surgery | Admitting: Surgery

## 2012-12-27 ENCOUNTER — Encounter (HOSPITAL_COMMUNITY): Payer: Self-pay | Admitting: General Practice

## 2012-12-27 DIAGNOSIS — L408 Other psoriasis: Secondary | ICD-10-CM | POA: Insufficient documentation

## 2012-12-27 DIAGNOSIS — E119 Type 2 diabetes mellitus without complications: Secondary | ICD-10-CM | POA: Insufficient documentation

## 2012-12-27 DIAGNOSIS — I739 Peripheral vascular disease, unspecified: Secondary | ICD-10-CM

## 2012-12-27 DIAGNOSIS — Z48812 Encounter for surgical aftercare following surgery on the circulatory system: Secondary | ICD-10-CM

## 2012-12-27 DIAGNOSIS — T82898A Other specified complication of vascular prosthetic devices, implants and grafts, initial encounter: Secondary | ICD-10-CM

## 2012-12-27 DIAGNOSIS — I10 Essential (primary) hypertension: Secondary | ICD-10-CM | POA: Insufficient documentation

## 2012-12-27 HISTORY — PX: ANGIOPLASTY / STENTING FEMORAL: SUR30

## 2012-12-27 HISTORY — DX: Type 2 diabetes mellitus without complications: E11.9

## 2012-12-27 HISTORY — PX: ABDOMINAL AORTAGRAM: SHX5454

## 2012-12-27 HISTORY — PX: LOWER EXTREMITY ANGIOGRAM: SHX5508

## 2012-12-27 HISTORY — DX: Pure hypercholesterolemia, unspecified: E78.00

## 2012-12-27 LAB — POCT I-STAT, CHEM 8
Calcium, Ion: 1.25 mmol/L (ref 1.13–1.30)
Chloride: 102 mEq/L (ref 96–112)
Creatinine, Ser: 1.4 mg/dL — ABNORMAL HIGH (ref 0.50–1.35)
Glucose, Bld: 199 mg/dL — ABNORMAL HIGH (ref 70–99)
HCT: 43 % (ref 39.0–52.0)
Hemoglobin: 14.6 g/dL (ref 13.0–17.0)
TCO2: 23 mmol/L (ref 0–100)

## 2012-12-27 LAB — GLUCOSE, CAPILLARY
Glucose-Capillary: 171 mg/dL — ABNORMAL HIGH (ref 70–99)
Glucose-Capillary: 142 mg/dL — ABNORMAL HIGH (ref 70–99)

## 2012-12-27 SURGERY — ABDOMINAL AORTAGRAM
Anesthesia: LOCAL

## 2012-12-27 MED ORDER — HYDRALAZINE HCL 20 MG/ML IJ SOLN: 10.0000 mg | INTRAMUSCULAR | Status: DC | PRN

## 2012-12-27 MED ORDER — FENTANYL CITRATE 0.05 MG/ML IJ SOLN
INTRAMUSCULAR | Status: AC
  Filled 2012-12-27: qty 2

## 2012-12-27 MED ORDER — HEPARIN (PORCINE) IN NACL 2-0.9 UNIT/ML-% IJ SOLN
INTRAMUSCULAR | Status: AC
  Filled 2012-12-27: qty 1000

## 2012-12-27 MED ORDER — OXYCODONE HCL 5 MG PO TABS: 5.0000 mg | ORAL_TABLET | ORAL | Status: DC | PRN

## 2012-12-27 MED ORDER — LIDOCAINE HCL (PF) 1 % IJ SOLN
INTRAMUSCULAR | Status: AC
  Filled 2012-12-27: qty 30

## 2012-12-27 MED ORDER — GUAIFENESIN-DM 100-10 MG/5ML PO SYRP: 15.0000 mL | ORAL_SOLUTION | ORAL | Status: DC | PRN

## 2012-12-27 MED ORDER — SODIUM CHLORIDE 0.9 % IV SOLN: INTRAVENOUS | Status: DC

## 2012-12-27 MED ORDER — HEPARIN SODIUM (PORCINE) 1000 UNIT/ML IJ SOLN
INTRAMUSCULAR | Status: AC
  Filled 2012-12-27: qty 1

## 2012-12-27 MED ORDER — ONDANSETRON HCL 4 MG/2ML IJ SOLN: 4.0000 mg | Freq: Four times a day (QID) | INTRAMUSCULAR | Status: DC | PRN

## 2012-12-27 MED ORDER — MIDAZOLAM HCL 2 MG/2ML IJ SOLN
INTRAMUSCULAR | Status: AC
  Filled 2012-12-27: qty 2

## 2012-12-27 MED ORDER — MORPHINE SULFATE 2 MG/ML IJ SOLN: 2.0000 mg | INTRAMUSCULAR | Status: DC | PRN

## 2012-12-27 MED ORDER — ALUM & MAG HYDROXIDE-SIMETH 200-200-20 MG/5ML PO SUSP: 15.0000 mL | ORAL | Status: DC | PRN

## 2012-12-27 MED ORDER — METOPROLOL TARTRATE 1 MG/ML IV SOLN: 2.0000 mg | INTRAVENOUS | Status: DC | PRN

## 2012-12-27 MED ORDER — ACETAMINOPHEN 650 MG RE SUPP: 325.0000 mg | RECTAL | Status: DC | PRN

## 2012-12-27 MED ORDER — LABETALOL HCL 5 MG/ML IV SOLN: 10.0000 mg | INTRAVENOUS | Status: DC | PRN

## 2012-12-27 MED ORDER — ACETAMINOPHEN 325 MG PO TABS: 325.0000 mg | ORAL_TABLET | ORAL | Status: DC | PRN

## 2012-12-27 MED ORDER — PHENOL 1.4 % MT LIQD
1.0000 | OROMUCOSAL | Status: DC | PRN
  Filled 2012-12-27: qty 177

## 2012-12-27 NOTE — H&P (View-Only) (Signed)
[facility]ist of [place]   Patient name: James Dougherty MRN: [id number] DOB: 11/29/1950 Sex: male     Chief Complaint  Patient presents with  . PVD    3 month f/u L LE ulceration    HISTORY OF PRESENT ILLNESS: This is a 61-year-old gentleman who was referred for evaluation of a nonhealing left leg ulcer. The patient states that he has had a wound on his left lateral leg since October of last year. He is now getting wound care the wound center. On 03/30/2012, the patient underwent angiography. He was found to have an occluded superficial femoral artery. He underwent successful recanalization of the occluded superficial femoral and popliteal artery with subsequent stent deployment using 6 mm stents. He is back today for followup.  He has now healed all the ulcers in his left leg and has been discharged from the wound center.  He still complains of claudication but states that he can walk approximately 100 be further they could prior to intervention.   Past Medical History  Diagnosis Date  . Diabetes mellitus without complication   . Hypertension   . Psoriasis   . Arthritis   . PAD (peripheral artery disease)   . Neuropathic pain   . Ulcer     Left ankle/leg  . Stroke 1999    Past Surgical History  Procedure Laterality Date  . Tonsillectomy and adenoidectomy    . Popliteal artery stent    . Femoral artery stent  03/30/2012    History   Social History  . Marital Status: Single    Spouse Name: N/A    Number of Children: N/A  . Years of Education: N/A   Occupational History  . Not on file.   Social History Main Topics  . Smoking status: Former Smoker    Quit date: 01/07/2011  . Smokeless tobacco: Never Used  . Alcohol Use: No  . Drug Use: No  . Sexual Activity: Not on file   Other Topics Concern  . Not on file   Social History Narrative  . No narrative on file    Family History  Problem Relation Age of Onset  . Diabetes Sister    Bilateral amputation of lower legs  . Diabetes Sister   . Heart disease Sister 57    Heart disease before age 60  . Hypertension Sister   . Heart attack Sister   . Hypertension Daughter     Allergies as of 12/12/2012 - Review Complete 12/12/2012  Allergen Reaction Noted  . Bactrim [sulfamethoxazole-trimethoprim] Rash 03/14/2012    Current Outpatient Prescriptions on File Prior to Visit  Medication Sig Dispense Refill  . aspirin 81 MG tablet Take 81 mg by mouth daily.      . gabapentin (NEURONTIN) 600 MG tablet Take 600 mg by mouth daily.      . lisinopril (PRINIVIL,ZESTRIL) 20 MG tablet Take 20 mg by mouth daily.      . metFORMIN (GLUCOPHAGE) 500 MG tablet Take 500 mg by mouth 4 (four) times daily - after meals and at bedtime.      . clobetasol ointment (TEMOVATE) 0.05 % Apply 1 application topically 2 (two) times daily.      . clopidogrel (PLAVIX) 75 MG tablet Take 75 mg by mouth daily.      . Coal Tar Extract (MG217 PSORIASIS MEDICATED EX) Apply topically.      . HYDROcodone-acetaminophen (NORCO/VICODIN) 5-325 MG per tablet Take 0.5 tablets by mouth 3 (three) times a   week.      . mupirocin ointment (BACTROBAN) 2 % Apply 1 application topically 3 (three) times daily.       No current facility-administered medications on file prior to visit.     REVIEW OF SYSTEMS: Please see history of present illness, otherwise no changes.  PHYSICAL EXAMINATION:   Vital signs are BP 118/57  Pulse 79  Ht 5' 8" (1.727 m)  Wt 237 lb 12.8 oz (107.865 kg)  BMI 36.17 kg/m2  SpO2 99% General: The patient appears their stated age. HEENT:  No gross abnormalities Pulmonary:  Non labored breathing Musculoskeletal: There are no major deformities. Neurologic: No focal weakness or paresthesias are detected, Skin: There are no ulcer or rashes noted. Psychiatric: The patient has normal affect. Cardiovascular: There is a regular rate and rhythm without significant murmur appreciated.   Diagnostic  Studies Duplex of the left leg was ordered and reviewed.  Today's ABI has decreased down to 0.7 from 0.8.  Velocity profile within the stent has also increased.  Peak velocity in the proximal stent is for 13 cm per sec  Assessment: Status post left leg percutaneous revascularization Plan: I had a lengthy conversation regarding the ultrasound findings today.  My strong feeling is that he needs to undergo angiography with further evaluation of the stent and treatment so as to avoid occlusion of his stents.  He had think about this for a long time because he did not want to go through another procedure.  I felt that if we go to proceed he is going to be at high risk for stent failure and potentially leave him worse than before.  He ultimately agreed to proceed.  His been scheduled for December 2.  V. [place] Jackey Housey IV, M.D. [facility] [place] Office: [phone] Pager:  [phone]    

## 2012-12-27 NOTE — Telephone Encounter (Addendum)
Message copied by Fredrich Birks on Tue Dec 27, 2012 11:13 AM ------      Message from: Melene Plan      Created: Tue Dec 27, 2012  9:30 AM                   ----- Message -----         From: Nada Libman, MD         Sent: 12/27/2012   8:56 AM           To: Reuel Derby, Melene Plan, RN            12/27/2012:                  Surgeon:  Jorge Ny      Procedure Performed:       1.  ultrasound-guided access, right femoral artery       2.  abdominal aortogram       3.  left lower extremity runoff       4.  second order catheterization       5.  angioplasty, left superficial femoral artery with drug-eluting balloon                  Followup in 6 months with Rosalita Chessman with a left lower extremity duplex ultrasound and ABI ------  12/27/12: unable to leave message, pts vm box is full- mailed letter, dpm

## 2012-12-27 NOTE — Op Note (Signed)
Patient name: James Dougherty MRN: [id number] DOB: 1950/03/05 Sex: male  12/27/2012 Pre-operative Diagnosis: In stent stenosis, left leg Post-operative diagnosis:  Same Surgeon:  Jorge Ny Procedure Performed:  1.  ultrasound-guided access, right femoral artery  2.  abdominal aortogram  3.  left lower extremity runoff  4.  second order catheterization  5.  angioplasty, left superficial femoral artery with drug-eluting balloon    Indications:  The patient has previously undergone subintimal recanalization and stent placement within the left superficial femoral and popliteal artery.  This was done for ulceration.  His ulcer has now healed, however surveillance ultrasound imaging revealed elevated velocities at the proximal portion of the stent, greater than 400 cm/s.  He comes in for further evaluation and treatment as indicated.  Procedure:  The patient was identified in the holding area and taken to room 8.  The patient was then placed supine on the table and prepped and draped in the usual sterile fashion.  A time out was called.  Ultrasound was used to evaluate the left common femoral artery.  It was patent .  A digital ultrasound image was acquired.  A micropuncture needle was used to access the right common femoral artery under ultrasound guidance.  An 018 wire was advanced without resistance and a micropuncture sheath was placed.  The 018 wire was removed and a benson wire was placed.  The micropuncture sheath was exchanged for a 5 french sheath.  An omniflush catheter was advanced over the wire to the level of L-1.  An abdominal angiogram was obtained.  Next, using the omniflush catheter and a benson wire, the aortic bifurcation was crossed and the catheter was placed into theleft external iliac artery and left runoff was obtained.    Findings:   Aortogram:  The suprarenal abdominal aorta is widely patent.  There are 3 left renal arteries which are widely patent.  The right  renal artery is patent with a early bifurcation.  The infrarenal abdominal aorta is widely patent.  Bilateral common iliac arteries are widely patent the left external iliac artery is widely patent.  There is a approximately 50% stenosis within the right external iliac artery.  Right Lower Extremity:  Not evaluated today  Left Lower Extremity:  The left common femoral artery is widely patent.  Left profunda femoral artery is widely patent.  Stents are visualized within the left superficial femoral and popliteal artery.  There is luminal narrowing at the proximal portion of the stents, on the order of 50-60%.  The popliteal artery remains patent.  Two-vessel runoff via the peroneal and posterior tibial artery is observed.  The posterior tibial artery is the dominant vessel across the ankle.  Intervention:  After the above images were acquired, the decision was made to proceed with intervention, given the angiographic appearance and the ultrasound findings of the proximal stent.  Over a 035 wire, a 5 French 40 cm Ansel 1 sheath was inserted into the left external iliac artery.  The patient was fully heparinized. A woolly wire was easily advanced into the superficial femoral artery stents.  I elected to pre-dilate the proximal portion of the stent using a 5 x 60 Armada balloon.  Next the 5 x 40 Lutonix balloon was placed at the proximal portion of the stent and inflated to 12 atmospheres (burst pressure) for 3 minutes.  Completion angiogram revealed resolution of the stenosis at the proximal portion of the stent.  At this point the sheath was  withdrawn to the right external iliac artery and the wire was removed.  The patient taken the holding area for sheath pull once his coag profile corrects.  Impression:  #1  successful balloon angioplasty using a drug-eluting 5 x 40 balloon of the proximal stenosis within the left superficial femoral artery stent    V. Durene Cal, M.D. [facility]  [place] Office: [phone] Pager:  [phone]

## 2012-12-27 NOTE — Interval H&P Note (Signed)
History and Physical Interval Note:  12/27/2012 8:00 AM  James Dougherty  has presented today for surgery, with the diagnosis of PVD  The various methods of treatment have been discussed with the patient and family. After consideration of risks, benefits and other options for treatment, the patient has consented to  Procedure(s): ABDOMINAL AORTAGRAM (N/A) as a surgical intervention .  The patient's history has been reviewed, patient examined, no change in status, stable for surgery.  I have reviewed the patient's chart and labs.  Questions were answered to the patient's satisfaction.     [name], V. WELLS

## 2012-12-28 LAB — POCT ACTIVATED CLOTTING TIME: Activated Clotting Time: 211 seconds

## 2013-03-03 ENCOUNTER — Other Ambulatory Visit: Payer: Self-pay | Admitting: Surgery

## 2013-03-03 DIAGNOSIS — I6529 Occlusion and stenosis of unspecified carotid artery: Secondary | ICD-10-CM

## 2013-03-03 DIAGNOSIS — Z48812 Encounter for surgical aftercare following surgery on the circulatory system: Secondary | ICD-10-CM

## 2013-06-05 ENCOUNTER — Ambulatory Visit (HOSPITAL_COMMUNITY)
Admission: RE | Admit: 2013-06-05 | Discharge: 2013-06-05 | Disposition: A | Discharge status: Home / Self Care | Payer: BC Managed Care – PPO | Source: Ambulatory Visit | Attending: Surgery | Admitting: Surgery

## 2013-06-05 DIAGNOSIS — I6529 Occlusion and stenosis of unspecified carotid artery: Secondary | ICD-10-CM

## 2013-06-05 DIAGNOSIS — Z48812 Encounter for surgical aftercare following surgery on the circulatory system: Secondary | ICD-10-CM

## 2013-06-26 ENCOUNTER — Encounter: Payer: Self-pay | Admitting: Family

## 2013-06-27 ENCOUNTER — Encounter: Payer: Self-pay | Admitting: Family

## 2013-06-27 ENCOUNTER — Ambulatory Visit (INDEPENDENT_AMBULATORY_CARE_PROVIDER_SITE_OTHER): Payer: BC Managed Care – PPO | Admitting: Family

## 2013-06-27 ENCOUNTER — Ambulatory Visit (HOSPITAL_COMMUNITY)
Admission: RE | Admit: 2013-06-27 | Discharge: 2013-06-27 | Disposition: A | Discharge status: Home / Self Care | Payer: BC Managed Care – PPO | Source: Ambulatory Visit | Attending: Family | Admitting: Family

## 2013-06-27 ENCOUNTER — Ambulatory Visit (INDEPENDENT_AMBULATORY_CARE_PROVIDER_SITE_OTHER)
Admit: 2013-06-27 | Discharge: 2013-06-27 | Disposition: A | Discharge status: Home / Self Care | Payer: BC Managed Care – PPO | Attending: Family | Admitting: Family

## 2013-06-27 VITALS — BP 126/68 | HR 73 | Resp 16 | Ht 68.0 in | Wt 233.0 lb

## 2013-06-27 DIAGNOSIS — Z48812 Encounter for surgical aftercare following surgery on the circulatory system: Secondary | ICD-10-CM | POA: Insufficient documentation

## 2013-06-27 DIAGNOSIS — I739 Peripheral vascular disease, unspecified: Secondary | ICD-10-CM

## 2013-06-27 DIAGNOSIS — L98499 Non-pressure chronic ulcer of skin of other sites with unspecified severity: Secondary | ICD-10-CM

## 2013-06-27 DIAGNOSIS — M79609 Pain in unspecified limb: Secondary | ICD-10-CM

## 2013-06-27 DIAGNOSIS — I6529 Occlusion and stenosis of unspecified carotid artery: Secondary | ICD-10-CM

## 2013-06-27 NOTE — Patient Instructions (Signed)
Peripheral Vascular Disease Peripheral Vascular Disease (PVD), also called Peripheral Arterial Disease (PAD), is a circulation problem caused by cholesterol (atherosclerotic plaque) deposits in the arteries. PVD commonly occurs in the lower extremities (legs) but it can occur in other areas of the body, such as your arms. The cholesterol buildup in the arteries reduces blood flow which can cause pain and other serious problems. The presence of PVD can place a person at risk for Coronary Artery Disease (CAD).  CAUSES  Causes of PVD can be many. It is usually associated with more than one risk factor such as:   High Cholesterol.  Smoking.  Diabetes.  Lack of exercise or inactivity.  High blood pressure (hypertension).  Obesity.  Family history. SYMPTOMS   When the lower extremities are affected, patients with PVD may experience:  Leg pain with exertion or physical activity. This is called INTERMITTENT CLAUDICATION. This may present as cramping or numbness with physical activity. The location of the pain is associated with the level of blockage. For example, blockage at the abdominal level (distal abdominal aorta) may result in buttock or hip pain. Lower leg arterial blockage may result in calf pain.  As PVD becomes more severe, pain can develop with less physical activity.  In people with severe PVD, leg pain may occur at rest.  Other PVD signs and symptoms:  Leg numbness or weakness.  Coldness in the affected leg or foot, especially when compared to the other leg.  A change in leg color.  Patients with significant PVD are more prone to ulcers or sores on toes, feet or legs. These may take longer to heal or may reoccur. The ulcers or sores can become infected.  If signs and symptoms of PVD are ignored, gangrene may occur. This can result in the loss of toes or loss of an entire limb.  Not all leg pain is related to PVD. Other medical conditions can cause leg pain such  as:  Blood clots (embolism) or Deep Vein Thrombosis.  Inflammation of the blood vessels (vasculitis).  Spinal stenosis. DIAGNOSIS  Diagnosis of PVD can involve several different types of tests. These can include:  Pulse Volume Recording Method (PVR). This test is simple, painless and does not involve the use of X-rays. PVR involves measuring and comparing the blood pressure in the arms and legs. An ABI (Ankle-Brachial Index) is calculated. The normal ratio of blood pressures is 1. As this number becomes smaller, it indicates more severe disease.  < 0.95  indicates significant narrowing in one or more leg vessels.  <0.8 there will usually be pain in the foot, leg or buttock with exercise.  <0.4 will usually have pain in the legs at rest.  <0.25  usually indicates limb threatening PVD.  Doppler detection of pulses in the legs. This test is painless and checks to see if you have a pulses in your legs/feet.  A dye or contrast material (a substance that highlights the blood vessels so they show up on x-ray) may be given to help your caregiver better see the arteries for the following tests. The dye is eliminated from your body by the kidney's. Your caregiver may order blood work to check your kidney function and other laboratory values before the following tests are performed:  Magnetic Resonance Angiography (MRA). An MRA is a picture study of the blood vessels and arteries. The MRA machine uses a large magnet to produce images of the blood vessels.  Computed Tomography Angiography (CTA). A CTA is a   specialized x-ray that looks at how the blood flows in your blood vessels. An IV may be inserted into your arm so contrast dye can be injected.  Angiogram. Is a procedure that uses x-rays to look at your blood vessels. This procedure is minimally invasive, meaning a small incision (cut) is made in your groin. A small tube (catheter) is then inserted into the artery of your groin. The catheter is  guided to the blood vessel or artery your caregiver wants to examine. Contrast dye is injected into the catheter. X-rays are then taken of the blood vessel or artery. After the images are obtained, the catheter is taken out. TREATMENT  Treatment of PVD involves many interventions which may include:  Lifestyle changes:  Quitting smoking.  Exercise.  Following a low fat, low cholesterol diet.  Control of diabetes.  Foot care is very important to the PVD patient. Good foot care can help prevent infection.  Medication:  Cholesterol-lowering medicine.  Blood pressure medicine.  Anti-platelet drugs.  Certain medicines may reduce symptoms of Intermittent Claudication.  Interventional/Surgical options:  Angioplasty. An Angioplasty is a procedure that inflates a balloon in the blocked artery. This opens the blocked artery to improve blood flow.  Stent Implant. A wire mesh tube (stent) is placed in the artery. The stent expands and stays in place, allowing the artery to remain open.  Peripheral Bypass Surgery. This is a surgical procedure that reroutes the blood around a blocked artery to help improve blood flow. This type of procedure may be performed if Angioplasty or stent implants are not an option. SEEK IMMEDIATE MEDICAL CARE IF:   You develop pain or numbness in your arms or legs.  Your arm or leg turns cold, becomes blue in color.  You develop redness, warmth, swelling and pain in your arms or legs. MAKE SURE YOU:   Understand these instructions.  Will watch your condition.  Will get help right away if you are not doing well or get worse. Document Released: 02/20/2004 Document Revised: 04/06/2011 Document Reviewed: 01/17/2008 [employer] Patient Information 2014 [employer].  

## 2013-06-27 NOTE — Progress Notes (Signed)
[facility] HISTORY [facility] PHYSICAL -PAD  History of Present Illness James Dougherty is a 63 y.o. male patient of Dr. Myra GianottiBrabham who is s/p Left superficial femoral and popliteal artery stents placed 03/30/2012 with angioplasty of stent origin 12/27/2012. He returns today for follow up. He feels that his left lateral lower leg ulcer is about the same, not worse, not improved, he has no other ulcers. States his blood sugars at home are 150-250 early morning and late night. He does not seem to have claudication symptoms in his right leg, left calf pain after walking 50-60 yards, relieved by 15 seconds rest. He was last seen in wound care clinic several months ago and is doing a daily dressing change to his left lateral lower leg ulcer as instructed by the clinic. He had a stroke in 1999 as manifested by aphasia and left hemiparesis which have resolved, denies any further stroke of TIA activity.    Pt Diabetic: Yes, home blood sugars are uncontrolled Pt smoker: former smoker, quit in 2013  Pt meds include: Statin :Yes ASA: Yes Other anticoagulants/antiplatelets: no  Past Medical History  Diagnosis Date  . Hypertension   . Psoriasis   . PAD (peripheral artery disease)   . Neuropathic pain   . Ulcer     Left ankle/leg  . High cholesterol   . Type II diabetes mellitus   . Stroke 1999    "still have some speech problems at times; sometimes forget what I was going to say" (12/27/2012)  . Arthritis     "hands" (12/27/2012)    Social History History  Substance Use Topics  . Smoking status: Former Smoker -- 3.00 packs/day for 45 years    Types: Cigarettes    Quit date: 01/07/2011  . Smokeless tobacco: Never Used  . Alcohol Use: Yes     Comment: 12/27/2012 "couple times/yr I might have a drink"    Family History Family History  Problem Relation Age of Onset  . Diabetes Sister     Bilateral amputation of lower legs  . Diabetes Sister   . Heart disease  Sister [id number]    Heart disease before age 63  . Hypertension Sister   . Heart attack Sister   . Hypertension Daughter     Past Surgical History  Procedure Laterality Date  . Tonsillectomy and adenoidectomy    . Popliteal artery stent    . Femoral artery stent  03/30/2012  . Angioplasty / stenting femoral Left 12/27/2012    Allergies  Allergen Reactions  . Bactrim [Sulfamethoxazole-Trimethoprim] Rash    Current Outpatient Prescriptions  Medication Sig Dispense Refill  . aspirin EC 81 MG tablet Take 81 mg by mouth daily.      [name]. gabapentin (NEURONTIN) 600 MG tablet Take 600 mg by mouth daily.      [name]. lisinopril-hydrochlorothiazide (PRINZIDE,ZESTORETIC) 20-25 MG per tablet Take 1 tablet by mouth daily.      . metFORMIN (GLUCOPHAGE) 500 MG tablet Take 500 mg by mouth 4 (four) times daily.       [name]. OVER THE COUNTER MEDICATION Apply 1 application topically 2 (two) times daily. Tri-Derma for psoriasis.      [name]. simvastatin (ZOCOR) 20 MG tablet Take 20 mg by mouth daily.      [name]. oxyCODONE (ROXICODONE) 5 MG immediate release tablet Take 1-2 tablets (5-10 mg total) by mouth every 4 (four) hours as needed for severe pain.  10 tablet  0   No current facility-administered  medications for this visit.    ROS: See HPI for pertinent positives and negatives.   Physical Examination  Filed Vitals:   06/27/13 1148  BP: 126/68  Pulse: 73  Resp: 16    General: A&O x 3, WDWN, obese male. Gait: normal Eyes: PERRLA. Pulmonary: CTAB, without wheezes , rales or rhonchi. Cardiac: regular Rythm , without detected murmur.        Aorta is not palpable. Radial pulses: are 1+ palpable and =                           VASCULAR EXAM: Extremities without ischemic changes  without gangrese; with open wounds, shallow ulcer left leg, see HPI.[name]                                                                                                          LE Pulses LEFT RIGHT       FEMORAL  not palpable  not  palpable        POPLITEAL  not palpable   not palpable       POSTERIOR TIBIAL  not palpable   not palpable        DORSALIS PEDIS      ANTERIOR TIBIAL not palpable  not palpable    Abdomen: soft, NT, no masses. Skin: no rashes, see extremities. Musculoskeletal: no muscle wasting or atrophy.  Neurologic: A&O X 3; Appropriate Affect ; SENSATION: normal; MOTOR FUNCTION:  moving all extremities equally, motor strength 5/5 throughout. Speech is fluent/normal. CN 2-12 intact except for hard of hearing.    Non-Invasive Vascular Imaging: DATE: 06/27/2013 LOWER EXTREMITY ARTERIAL EVALUATION    INDICATION: Peripheral vascular disease.    PREVIOUS INTERVENTION(S): Left superficial femoral and popliteal artery stents placed 03/30/2012 with angioplasty of stent origin 12/27/2012.    DUPLEX EXAM:     RIGHT  LEFT   Peak Systolic Velocity (cm/s) Ratio (if abnormal) Waveform  Peak Systolic Velocity (cm/s) Ratio (if abnormal) Waveform     Artery - Proximal to Stent 151  T      Stent - Origin 190  B     Stent - Proximal 193  B     Stent - Mid 179  B     Stent - Distal 102  B      Stent - End 63  B     Artery - Distal to Stent 82  B  0.52 Today's ABI / TBI 0.63  0.53 Previous ABI / TBI (12/12/2012  ) 0.70    Waveform:    M - Monophasic       B - Biphasic       T - Triphasic  If Ankle Brachial Index (ABI) or Toe Brachial Index (TBI) performed, please see complete report     ADDITIONAL FINDINGS:     IMPRESSION: Patent left superficial femoral and popliteal stents with elevated velocities noted in the stent origin/proximal stent segments. According to velocity criteria, these velocities indicate a <50% stenosis (high end of range).  The popliteal artery stent was difficult to visualize.        06/05/2013 Carotid Duplex: CEREBROVASCULAR DUPLEX EVALUATION    INDICATION: Carotid disease    PREVIOUS INTERVENTION(S):     DUPLEX EXAM:     RIGHT  LEFT  Peak Systolic Velocities (cm/s) End  Diastolic Velocities (cm/s) Plaque LOCATION Peak Systolic Velocities (cm/s) End Diastolic Velocities (cm/s) Plaque  94 17  CCA PROXIMAL 110 20   137 33 HT CCA MID 141 33 HT  133 30 HT CCA DISTAL 158 38 HT  272 24 HT ECA 152 12 HT  261 70 HT ICA PROXIMAL 207 45 HT  104 23  ICA MID 102 32   75 23  ICA DISTAL 60 22     1.96 ICA / CCA Ratio (PSV) 1.31  Antegrade Vertebral Flow Antegrade  136 Brachial Systolic Pressure (mmHg) 134  Multiphasic (subclavian artery) Brachial Artery Waveforms Multiphasic (subclavian artery)    Plaque Morphology:  HM = Homogeneous, HT = Heterogeneous, CP = Calcific Plaque, SP = Smooth Plaque, IP = Irregular Plaque     ADDITIONAL FINDINGS:   No significant stenosis of the left external or bilateral common carotid arteries.   Right external carotid artery stenosis noted.    IMPRESSION: Doppler velocities suggest a 60-79% stenosis of the right proximal internal carotid artery and a 40-59% stenosis of the left proximal internal carotid artery.    Compared to the previous exam:  No previous duplex exam at this facility available for comparison.        ASSESSMENT: James Dougherty is a 63 y.o. male who is s/p Left superficial femoral and popliteal artery stents placed 03/30/2012 with angioplasty of stent origin 12/27/2012. He feels that his left lateral lower leg ulcer is about the same, not worse, not improved, he has no other ulcers. Patent left superficial femoral and popliteal stents with elevated velocities noted in the stent origin/proximal stent segments. According to velocity criteria, these velocities indicate a <50% stenosis (high end of range). The popliteal artery stent was difficult to visualize.  He had a stroke in 1999 as manifested by aphasia and left hemiparesis which have resolved, denies any further stroke of TIA activity.  Doppler velocities suggest a 60-79% stenosis of the right proximal internal carotid artery and a 40-59% stenosis of the left  proximal internal carotid artery. No previous duplex exam at this facility available for comparison.     Consider intensive statin therapy which is associated with a greater reduction in CVD risk and improved endothelial function , will defer to PCP.   PLAN:  Graduated walking program, continue daily dressing change as instructed by wound care clinic. I discussed in depth with the patient the nature of atherosclerosis, and emphasized the importance of maximal medical management including strict control of blood pressure, blood glucose, and lipid levels, obtaining regular exercise, and continued cessation of smoking.  The patient is aware that without maximal medical management the underlying atherosclerotic disease process will progress, limiting the benefit of any interventions.  Based on the patient's vascular studies and examination, pt will return to clinic in 6 months for carotid Duplex, ABI's, and left LE arterial Duplex.  The patient was given information about PAD and stroke prevention including signs, symptoms, treatment, what symptoms should prompt the patient to seek immediate medical care, and risk reduction measures to take.  Charisse March, RN, MSN, FNP-C [facility] Phone: [phone]  Clinic MD: Hart [place]  06/27/2013 12:04 PM

## 2013-12-20 ENCOUNTER — Encounter: Payer: Self-pay | Admitting: Surgery

## 2013-12-25 ENCOUNTER — Encounter: Payer: Self-pay | Admitting: Family

## 2013-12-25 ENCOUNTER — Ambulatory Visit (INDEPENDENT_AMBULATORY_CARE_PROVIDER_SITE_OTHER)
Admission: RE | Admit: 2013-12-25 | Discharge: 2013-12-25 | Disposition: A | Discharge status: Home / Self Care | Payer: BC Managed Care – PPO | Source: Ambulatory Visit | Attending: Family | Admitting: Family

## 2013-12-25 ENCOUNTER — Ambulatory Visit (HOSPITAL_COMMUNITY)
Admission: RE | Admit: 2013-12-25 | Discharge: 2013-12-25 | Disposition: A | Discharge status: Home / Self Care | Payer: BC Managed Care – PPO | Source: Ambulatory Visit | Attending: Family | Admitting: Family

## 2013-12-25 ENCOUNTER — Ambulatory Visit (INDEPENDENT_AMBULATORY_CARE_PROVIDER_SITE_OTHER): Payer: BC Managed Care – PPO | Admitting: Family

## 2013-12-25 VITALS — BP 122/68 | HR 76 | Resp 16 | Ht 68.0 in | Wt 232.0 lb

## 2013-12-25 DIAGNOSIS — I6523 Occlusion and stenosis of bilateral carotid arteries: Secondary | ICD-10-CM

## 2013-12-25 DIAGNOSIS — I7025 Atherosclerosis of native arteries of other extremities with ulceration: Secondary | ICD-10-CM

## 2013-12-25 DIAGNOSIS — I739 Peripheral vascular disease, unspecified: Secondary | ICD-10-CM

## 2013-12-25 DIAGNOSIS — Z48812 Encounter for surgical aftercare following surgery on the circulatory system: Secondary | ICD-10-CM

## 2013-12-25 DIAGNOSIS — I6529 Occlusion and stenosis of unspecified carotid artery: Secondary | ICD-10-CM | POA: Diagnosis not present

## 2013-12-25 DIAGNOSIS — L98499 Non-pressure chronic ulcer of skin of other sites with unspecified severity: Secondary | ICD-10-CM

## 2013-12-25 DIAGNOSIS — I70203 Unspecified atherosclerosis of native arteries of extremities, bilateral legs: Secondary | ICD-10-CM | POA: Diagnosis present

## 2013-12-25 NOTE — Progress Notes (Signed)
[facility] [place] HISTORY AND PHYSICAL   MRN : [id number]  History of Present Illness:   James Dougherty is a 63 y.o. male  patient of Dr. Myra GianottiBrabham who is s/p left superficial femoral and popliteal artery stents placed 03/30/2012 with angioplasty of stent origin 12/27/2012. He also has carotid artery stenosis. He returns today for follow up.  States his blood sugars at home are 150-200 early morning and late night. He does not seem to have claudication symptoms in his right leg, left calf pain after walking 50-60 yards, relieved by 15 seconds rest, this has not changed since 2013. His his left lateral lower leg ulcer has healed, he was seen in the wound clinic. He had a stroke in 1999 as manifested by aphasia and left hemiparesis which have resolved, denies any further stroke of TIA activity.  He walks 30-45 minutes daily.   Pt Diabetic: Yes Pt smoker: former smoker, quit in 2012  Pt meds include: Statin :Yes ASA: Yes Other anticoagulants/antiplatelets: no  Current Outpatient Prescriptions  Medication Sig Dispense Refill  . aspirin EC 81 MG tablet Take 81 mg by mouth daily.    [name]. gabapentin (NEURONTIN) 600 MG tablet Take 600 mg by mouth daily.    [name]. lisinopril-hydrochlorothiazide (PRINZIDE,ZESTORETIC) 20-25 MG per tablet Take 1 tablet by mouth daily.    . metFORMIN (GLUCOPHAGE) 500 MG tablet Take 500 mg by mouth 4 (four) times daily.     [name]. OVER THE COUNTER MEDICATION Apply 1 application topically 2 (two) times daily. Tri-Derma for psoriasis.    [name]. simvastatin (ZOCOR) 20 MG tablet Take 20 mg by mouth daily.    [name]. oxyCODONE (ROXICODONE) 5 MG immediate release tablet Take 1-2 tablets (5-10 mg total) by mouth every 4 (four) hours as needed for severe pain. (Patient not taking: Reported on 12/25/2013) 10 tablet 0   No current facility-administered medications for this visit.    Past Medical History  Diagnosis Date  . Hypertension   . Psoriasis   . PAD  (peripheral artery disease)   . Neuropathic pain   . Ulcer     Left ankle/leg  . High cholesterol   . Type II diabetes mellitus   . Stroke 1999    "still have some speech problems at times; sometimes forget what I was going to say" (12/27/2012)  . Arthritis     "hands" (12/27/2012)    Social History History  Substance Use Topics  . Smoking status: Former Smoker -- 3.00 packs/day for 45 years    Types: Cigarettes    Quit date: 01/07/2011  . Smokeless tobacco: Never Used  . Alcohol Use: Yes     Comment: 12/27/2012 "couple times/yr I might have a drink"    Family History Family History  Problem Relation Age of Onset  . Diabetes Sister     Bilateral amputation of lower legs  . Diabetes Sister   . Heart disease Sister [id number]    Heart disease before age 63  . Hypertension Sister   . Heart attack Sister   . Hypertension Daughter   . Hypertension Father     Surgical History Past Surgical History  Procedure Laterality Date  . Tonsillectomy and adenoidectomy    . Popliteal artery stent    . Femoral artery stent  03/30/2012  . Angioplasty / stenting femoral Left 12/27/2012    Allergies  Allergen Reactions  . Bactrim [Sulfamethoxazole-Trimethoprim] Itching and Rash    Current Outpatient Prescriptions  Medication Sig Dispense Refill  .  aspirin EC 81 MG tablet Take 81 mg by mouth daily.    [name]. gabapentin (NEURONTIN) 600 MG tablet Take 600 mg by mouth daily.    [name]. lisinopril-hydrochlorothiazide (PRINZIDE,ZESTORETIC) 20-25 MG per tablet Take 1 tablet by mouth daily.    . metFORMIN (GLUCOPHAGE) 500 MG tablet Take 500 mg by mouth 4 (four) times daily.     [name]. OVER THE COUNTER MEDICATION Apply 1 application topically 2 (two) times daily. Tri-Derma for psoriasis.    [name]. simvastatin (ZOCOR) 20 MG tablet Take 20 mg by mouth daily.    [name]. oxyCODONE (ROXICODONE) 5 MG immediate release tablet Take 1-2 tablets (5-10 mg total) by mouth every 4 (four) hours as needed for severe pain. (Patient not  taking: Reported on 12/25/2013) 10 tablet 0   No current facility-administered medications for this visit.     REVIEW OF SYSTEMS: See HPI for pertinent positives and negatives.  Physical Examination Filed Vitals:   12/25/13 1453 12/25/13 1500  BP: 124/68 122/68  Pulse: 76 76  Resp:  16  Height:  5\' 8"  (1.727 m)  Weight:  232 lb (105.235 kg)  SpO2:  97%   Body mass index is 35.28 kg/(m^2).  General: A&O x 3, WDWN, obese male. Gait: normal Eyes: PERRLA. Pulmonary: CTAB, without wheezes , rales or rhonchi. Cardiac: regular Rythm , without detected murmur, no carotid bruits.    Aorta is not palpable. Radial pulses: are 1+ palpable and =   VASCULAR EXAM: Extremities without ischemic changes  without gangrene; without open wounds.     LE Pulses LEFT RIGHT   FEMORAL not palpable not palpable    POPLITEAL not palpable  not palpable   POSTERIOR TIBIAL not palpable  not palpable    DORSALIS PEDIS  ANTERIOR TIBIAL not palpable  not palpable    Abdomen: soft, NT, no palptated masses. Skin: no rashes, no ulcers. Musculoskeletal: no muscle wasting or atrophy. Neurologic: A&O X 3; Appropriate Affect ; SENSATION: normal; MOTOR FUNCTION: moving all extremities equally, motor strength 5/5 throughout. Speech is fluent/normal.  CN 2-12 intact except for hard of hearing.    Non-Invasive Vascular Imaging (12/25/2013):   CEREBROVASCULAR DUPLEX EVALUATION    INDICATION: Carotid artery disease    PREVIOUS INTERVENTION(S): None    DUPLEX EXAM: Carotid duplex    RIGHT  LEFT  Peak Systolic Velocities (cm/s) End Diastolic Velocities (cm/s) Plaque LOCATION Peak Systolic Velocities (cm/s) End Diastolic Velocities (cm/s) Plaque  90 23 HT CCA PROXIMAL 126 32 HT   136 31 HT CCA MID 130 40 HT  172 29 HT CCA DISTAL 126 35 HT  303 33 HT ECA 131 18 HT  294 96 HT ICA PROXIMAL 216 66 HT  174 41 - ICA MID 106 31 -  106 31 - ICA DISTAL 77 28 -    2.1 ICA / CCA Ratio (PSV) 1.6  Antegrade Vertebral Flow Antegrade  114 Brachial Systolic Pressure (mmHg) 129  Triphasic Brachial Artery Waveforms Triphasic    Plaque Morphology:  HM = Homogeneous, HT = Heterogeneous, CP = Calcific Plaque, SP = Smooth Plaque, IP = Irregular Plaque     ADDITIONAL FINDINGS:     IMPRESSION: 1. 60 - 79% right internal carotid artery stenosis. 2. 40 - 59% left internal carotid artery stenosis. 3. Right external carotid artery stenosis    Compared to the previous exam:  Increased velocity bilaterally    LOWER EXTREMITY ARTERIAL EVALUATION    INDICATION: PVD    PREVIOUS INTERVENTION(S): Left SFA and  popliteal artery stents placed 04/09/2012 with angioplasty of stent origin 12/27/2012    DUPLEX EXAM:     RIGHT  LEFT   Peak Systolic Velocity (cm/s) Ratio (if abnormal) Waveform  Peak Systolic Velocity (cm/s) Ratio (if abnormal) Waveform     Artery - Proximal to Stent 203  B-T     Stent - Origin 190  B-T     Stent - Proximal 175  B-T     Stent - Mid 149  B-T     Stent - Distal 92  B-T      Stent - End 70  M brisk     Artery - Distal to Stent 98  B  .76 Today's ABI / TBI .84  .52 Previous ABI / TBI (  06/27/13) .63    Waveform:    M - Monophasic       B - Biphasic       T - Triphasic  If Ankle Brachial Index (ABI) or Toe Brachial Index (TBI) performed, please see complete report     ADDITIONAL FINDINGS:     IMPRESSION: 1.Patent left SFA and popliteal stents with less than 50% stenosis 2. Greater than 50% stenosis  at the level of the tibial arteries / distal popliteal artery.    Compared to the previous exam:  No significant change      ASSESSMENT:  James Dougherty is a 63 y.o. male  who is s/p left superficial femoral and popliteal artery stents placed  03/30/2012 with angioplasty of stent origin 12/27/2012. He also has carotid artery stenosis. He had a stroke in 1999 as manifested by aphasia and left hemiparesis which have resolved, denies any further stroke of TIA activity.  Today's carotid Duplex reveals 60 - 79% right internal carotid artery stenosis and 40 - 59% left internal carotid artery stenosis; increased velocity bilaterally. He does not seem to have claudication symptoms in his right leg, left calf pain after walking 50-60 yards, relieved by 15 seconds rest, this has not changed since 2013. His his left lateral lower leg ulcer has healed, he was seen in the wound clinic. Today's left LE arterial Duplex reveals a patent left SFA and popliteal stents with less than 50% stenosis and greater than 50% stenosis  at the level of the tibial arteries / distal popliteal artery; no significant change from previous Duplex on 06/27/13. ABI's are significantly improved in both LE's, most likely due to his walking 30-45 minutes daily.    PLAN:   Continue graduated walking program. Continue maximal medical management of his atherosclerotic risk factors.  Based on today's exam and non-invasive vascular lab results, the patient will follow up in 6 months with the following tests: carotid Duplex, ABI's, and left LE arterial Duplex. I discussed in depth with the patient the nature of atherosclerosis, and emphasized the importance of maximal medical management including strict control of blood pressure, blood glucose, and lipid levels, obtaining regular exercise, and cessation of smoking.  The patient is aware that without maximal medical management the underlying atherosclerotic disease process will progress, limiting the benefit of any interventions.  The patient was given information about stroke prevention and what symptoms should prompt the patient to seek immediate medical care.  The patient was given information about PAD including signs, symptoms,  treatment, what symptoms should prompt the patient to seek immediate medical care, and risk reduction measures to take. Thank you for allowing [place] to participate in this patient's care.  Charisse March, RN, MSN,  FNP-C [facility] Office: [phone]  Clinic MD: Myra Gianotti 12/25/2013 3:07 PM

## 2013-12-25 NOTE — Patient Instructions (Signed)

## 2014-01-04 ENCOUNTER — Encounter (HOSPITAL_COMMUNITY): Payer: Self-pay | Admitting: Surgery

## 2014-07-02 ENCOUNTER — Ambulatory Visit: Payer: BC Managed Care – PPO | Admitting: Family

## 2014-07-02 ENCOUNTER — Encounter: Payer: Self-pay | Admitting: Family

## 2014-07-02 ENCOUNTER — Other Ambulatory Visit (HOSPITAL_COMMUNITY): Payer: BC Managed Care – PPO

## 2014-07-02 ENCOUNTER — Encounter (HOSPITAL_COMMUNITY): Payer: BC Managed Care – PPO

## 2014-07-03 ENCOUNTER — Encounter: Payer: Self-pay | Admitting: Family

## 2014-07-04 ENCOUNTER — Ambulatory Visit (INDEPENDENT_AMBULATORY_CARE_PROVIDER_SITE_OTHER)
Admission: RE | Admit: 2014-07-04 | Discharge: 2014-07-04 | Disposition: A | Discharge status: Home / Self Care | Payer: 59 | Source: Ambulatory Visit | Attending: Family | Admitting: Family

## 2014-07-04 ENCOUNTER — Ambulatory Visit (HOSPITAL_COMMUNITY)
Admission: RE | Admit: 2014-07-04 | Discharge: 2014-07-04 | Disposition: A | Discharge status: Home / Self Care | Payer: 59 | Source: Ambulatory Visit | Attending: Family | Admitting: Family

## 2014-07-04 ENCOUNTER — Ambulatory Visit (INDEPENDENT_AMBULATORY_CARE_PROVIDER_SITE_OTHER): Payer: 59 | Admitting: Family

## 2014-07-04 ENCOUNTER — Encounter: Payer: Self-pay | Admitting: Family

## 2014-07-04 VITALS — BP 130/72 | HR 75 | Resp 16 | Ht 68.0 in | Wt 233.0 lb

## 2014-07-04 DIAGNOSIS — E1159 Type 2 diabetes mellitus with other circulatory complications: Secondary | ICD-10-CM | POA: Diagnosis not present

## 2014-07-04 DIAGNOSIS — Z48812 Encounter for surgical aftercare following surgery on the circulatory system: Secondary | ICD-10-CM

## 2014-07-04 DIAGNOSIS — I739 Peripheral vascular disease, unspecified: Secondary | ICD-10-CM | POA: Insufficient documentation

## 2014-07-04 DIAGNOSIS — R1030 Lower abdominal pain, unspecified: Secondary | ICD-10-CM | POA: Insufficient documentation

## 2014-07-04 DIAGNOSIS — Z87891 Personal history of nicotine dependence: Secondary | ICD-10-CM | POA: Insufficient documentation

## 2014-07-04 DIAGNOSIS — Z9889 Other specified postprocedural states: Secondary | ICD-10-CM

## 2014-07-04 DIAGNOSIS — Z95820 Peripheral vascular angioplasty status with implants and grafts: Secondary | ICD-10-CM | POA: Insufficient documentation

## 2014-07-04 DIAGNOSIS — E785 Hyperlipidemia, unspecified: Secondary | ICD-10-CM | POA: Insufficient documentation

## 2014-07-04 DIAGNOSIS — I6523 Occlusion and stenosis of bilateral carotid arteries: Secondary | ICD-10-CM

## 2014-07-04 DIAGNOSIS — E1151 Type 2 diabetes mellitus with diabetic peripheral angiopathy without gangrene: Secondary | ICD-10-CM

## 2014-07-04 DIAGNOSIS — I10 Essential (primary) hypertension: Secondary | ICD-10-CM | POA: Insufficient documentation

## 2014-07-04 DIAGNOSIS — E119 Type 2 diabetes mellitus without complications: Secondary | ICD-10-CM | POA: Diagnosis not present

## 2014-07-04 DIAGNOSIS — I779 Disorder of arteries and arterioles, unspecified: Secondary | ICD-10-CM

## 2014-07-04 DIAGNOSIS — E669 Obesity, unspecified: Secondary | ICD-10-CM

## 2014-07-04 DIAGNOSIS — Z959 Presence of cardiac and vascular implant and graft, unspecified: Secondary | ICD-10-CM

## 2014-07-04 NOTE — Progress Notes (Signed)
[facility] [place] HISTORY AND PHYSICAL -PAD  History of Present Illness James Dougherty is a 64 y.o. male patient of Dr. Myra Gianotti who is s/p left superficial femoral and popliteal artery stents placed 03/30/2012 with angioplasty of stent origin 12/27/2012. He also has carotid artery stenosis. He returns today for follow up.  States his blood sugars at home are 150-200 early morning and late night. He does not seem to have claudication symptoms in his right leg, left calf pain after walking 50-60 yards, relieved by 15 seconds rest, this has not changed since 2013. His his left lateral lower leg ulcer has healed, he was seen in the wound clinic. He had a stroke in 1999 as manifested by aphasia and left hemiparesis which have resolved, denies any further stroke or TIA activity.  He was walking 30-45 minutes daily until December 2015 at which time he woke up one morning with severe low back pain. After a couple of months of physical therapy his back pain resolved and he is back to walking 30 minutes daily.  Pt Diabetic: Yes, states his last A1C was 7.2, improved from 10.6 in the beginning of 2015 per pt. Pt smoker: former smoker, quit in 2012  Pt meds include: Statin :Yes ASA: Yes Other anticoagulants/antiplatelets: no     Past Medical History  Diagnosis Date  . Hypertension   . Psoriasis   . PAD (peripheral artery disease)   . Neuropathic pain   . Ulcer     Left ankle/leg  . High cholesterol   . Type II diabetes mellitus   . Stroke 1999    "still have some speech problems at times; sometimes forget what I was going to say" (12/27/2012)  . Arthritis     "hands" (12/27/2012)    Social History History  Substance Use Topics  . Smoking status: Former Smoker -- 3.00 packs/day for 45 years    Types: Cigarettes    Quit date: 01/07/2011  . Smokeless tobacco: Never Used  . Alcohol Use: Yes     Comment: 12/27/2012 "couple times/yr I might have a drink"     Family History Family History  Problem Relation Age of Onset  . Diabetes Sister     Bilateral amputation of lower legs  . Diabetes Sister   . Heart disease Sister 93    Heart disease before age 66  . Hypertension Sister   . Heart attack Sister   . Hypertension Daughter   . Hypertension Father     Past Surgical History  Procedure Laterality Date  . Tonsillectomy and adenoidectomy    . Popliteal artery stent    . Femoral artery stent  03/30/2012  . Angioplasty / stenting femoral Left 12/27/2012  . Abdominal aortagram N/A 03/30/2012    Procedure: ABDOMINAL Ronny Flurry;  Surgeon: Nada Libman, MD;  Location: [facility] CATH LAB;  Service: Cardiovascular;  Laterality: N/A;  . Abdominal aortagram N/A 12/27/2012    Procedure: ABDOMINAL AORTAGRAM;  Surgeon: Nada Libman, MD;  Location: [facility] CATH LAB;  Service: Cardiovascular;  Laterality: N/A;  . Lower extremity angiogram  12/27/2012    Procedure: LOWER EXTREMITY ANGIOGRAM;  Surgeon: Nada Libman, MD;  Location: [facility] CATH LAB;  Service: Cardiovascular;;    Allergies  Allergen Reactions  . Bactrim [Sulfamethoxazole-Trimethoprim] Itching and Rash    Current Outpatient Prescriptions  Medication Sig Dispense Refill  . aspirin EC 81 MG tablet Take 81 mg by mouth daily.    [name] gabapentin (NEURONTIN) 600 MG  tablet Take 600 mg by mouth daily.    [name] lisinopril-hydrochlorothiazide (PRINZIDE,ZESTORETIC) 20-25 MG per tablet Take 1 tablet by mouth daily.    . metFORMIN (GLUCOPHAGE) 500 MG tablet Take 500 mg by mouth 4 (four) times daily.     [name] OVER THE COUNTER MEDICATION Apply 1 application topically 2 (two) times daily. Tri-Derma for psoriasis.    [name] oxyCODONE (ROXICODONE) 5 MG immediate release tablet Take 1-2 tablets (5-10 mg total) by mouth every 4 (four) hours as needed for severe pain. (Patient not taking: Reported on 12/25/2013) 10 tablet 0  . simvastatin (ZOCOR) 20 MG tablet Take 20 mg by mouth daily.     No current facility-administered  medications for this visit.    ROS: See HPI for pertinent positives and negatives.   Physical Examination  Filed Vitals:   07/04/14 1350 07/04/14 1355  BP: 132/72 130/72  Pulse: 76 75  Resp:  16  Height:  5\' 8"  (1.727 m)  Weight:  233 lb (105.688 kg)  SpO2:  97%   Body mass index is 35.44 kg/(m^2).   General: A&O x 3, WDWN, obese male. Gait: normal Eyes: PERRLA. Pulmonary: CTAB, without wheezes , rales or rhonchi. Cardiac: regular Rythm , without detected murmur, no carotid bruits.    Aorta is not palpable. Radial pulses: are 1+ palpable and =   VASCULAR EXAM: Extremities without ischemic changes  without gangrene; without open wounds. Left 4th toe is slightly red, no open wound, since he cut his toenails 3 days ago, is no worse, no better per pt.     LE Pulses LEFT RIGHT   FEMORAL not palpable not palpable    POPLITEAL not palpable  not palpable   POSTERIOR TIBIAL not palpable  not palpable    DORSALIS PEDIS  ANTERIOR TIBIAL not palpable  not palpable    Abdomen: soft, NT, no palptated masses. Skin: no rashes, no ulcers. See extremities. Musculoskeletal: no muscle wasting or atrophy. Neurologic: A&O X 3; Appropriate Affect ; SENSATION: normal; MOTOR FUNCTION: moving all extremities equally, motor strength 5/5 throughout. Speech is fluent/normal.  CN 2-12 intact except for hard of hearing.            Non-Invasive Vascular Imaging: DATE: 07/04/2014  CEREBROVASCULAR DUPLEX EVALUATION    INDICATION: Carotid artery disease     PREVIOUS INTERVENTION(S):     DUPLEX EXAM:     RIGHT  LEFT  Peak Systolic Velocities (cm/s) End Diastolic Velocities (cm/s) Plaque LOCATION Peak Systolic Velocities (cm/s) End Diastolic  Velocities (cm/s) Plaque  115 29 HT CCA PROXIMAL 138 26 HT  99 26 HT CCA MID 105 27 HT  108 31 HT CCA DISTAL 154 32 HT  224 22 HT ECA 107 9 HT  143 33 HT ICA PROXIMAL 154 43 HT  254 88  ICA MID 136 41   106 25  ICA DISTAL 128 42     2.56 ICA / CCA Ratio (PSV) 1.46  Antegrade  Vertebral Flow Antegrade   125 Brachial Systolic Pressure (mmHg) 133  Multiphasic (Subclavian artery) Brachial Artery Waveforms Multiphasic (Subclavian artery)    Plaque Morphology:  HM = Homogeneous, HT = Heterogeneous, CP = Calcific Plaque, SP = Smooth Plaque, IP = Irregular Plaque  ADDITIONAL FINDINGS:     IMPRESSION: Right internal carotid artery velocities suggest a 50-69% stenosis.  Left internal carotid artery velocities suggest a 50-69% stenosis.     Compared to the previous exam:  No significant change in comparison to the last exam  on 12/25/2013.    LOWER EXTREMITY ARTERIAL EVALUATION    INDICATION: Peripheral vascular disease     PREVIOUS INTERVENTION(S): Left superficial femoral and popliteal artery stent 04/09/2013 with angioplasty of stent origin 12/27/2012.    DUPLEX EXAM:     RIGHT  LEFT   Peak Systolic Velocity (cm/s) Ratio (if abnormal) Waveform  Peak Systolic Velocity (cm/s) Ratio (if abnormal) Waveform     Artery - Proximal to Stent 176  T/B      Stent - Origin 190   T/B     Stent - Proximal 117  T/B     Stent - Mid 144  T/B     Stent - Distal 98  B      Stent - End 74  B     Artery - Distal to Stent 93  B  0.56 Today's ABI / TBI 0.70  0.76 (Falsely elevated) Previous ABI / TBI (12/25/2013  )  0.84 (Falsely elevated)    Waveform:    M - Monophasic       B - Biphasic       T - Triphasic  If Ankle Brachial Index (ABI) or Toe Brachial Index (TBI) performed, please see complete report  ADDITIONAL FINDINGS:     IMPRESSION: Patent left superficial femoral and popliteal artery stents.    Compared to the previous exam:  No significant change in comparison to the last exam on  12/25/2013.     ASSESSMENT: Sonia SideJames F Snyders is a 64 y.o. male who is s/p left superficial femoral and popliteal artery stents placed 03/30/2012 with angioplasty of stent origin 12/27/2012. He also has carotid artery stenosis. He had a stroke in 1999 with no residual neurological deficits and no subsequent strokes or TIA's. Pt advised to ask his PCP for a referral to a podiatrist to cut his toenails and perform regular diabetic foot exams. Pt's left 4th toe is red since he cut his toenails 3 days ago, has not improved, has not gotten worse in the last 3 days per pt. I offered to give him a referral to a podiatrist in [place], but pt prefers to ask his PCP.    Today's carotid Duplex suggests 50-69% bilateral ICA stenosis. No significant change in comparison to the last exam on 12/25/2013.  Today's left LE arterial Duplex suggests patent left superficial femoral and popliteal artery stents.  ABI's may not be reliable due to calcified arteries; all waveforms are monophasic. TBI's: Right: 0.41, toe pressure: 55, Left: 0.38, toe pressure: 51.  He is motivated and doing well with his graduated walking program. I encouraged him to continue this to facilitate the proliferation of collateral arterial perfusion.   Face to face time with patient was 25 minutes. Over 50% of this time was spent on counseling and coordination of care.   PLAN:  Continue graduated walking program. Continue to work closely with his PCP on DM management.  I discussed in depth with the patient the nature of atherosclerosis, and emphasized the importance of maximal medical management including strict control of blood pressure, blood glucose, and lipid levels, obtaining regular exercise, and continued cessation of smoking.  The patient is aware that without maximal medical management the underlying atherosclerotic disease process will progress, limiting the benefit of any interventions.  Based on the patient's vascular studies  and examination, pt will return to clinic in 6 months for ABI's, left LE arterial Duplex, and carotid Duplex  The patient was given information about PAD including signs, symptoms,  treatment, what symptoms should prompt the patient to seek immediate medical care, and risk reduction measures to take.  Charisse March, RN, MSN, FNP-C [facility] [place] Phone: [phone]  Clinic MD: Darrick Penna  07/04/2014 1:53 PM

## 2014-07-05 NOTE — Addendum Note (Signed)
Addended by: Adria Dill L on: 07/05/2014 03:14 PM   Modules accepted: Orders

## 2014-07-11 ENCOUNTER — Ambulatory Visit: Payer: Self-pay | Admitting: Family

## 2014-10-17 DIAGNOSIS — I10 Essential (primary) hypertension: Secondary | ICD-10-CM | POA: Diagnosis not present

## 2014-10-17 DIAGNOSIS — E780 Pure hypercholesterolemia: Secondary | ICD-10-CM | POA: Diagnosis not present

## 2014-10-17 DIAGNOSIS — E1165 Type 2 diabetes mellitus with hyperglycemia: Secondary | ICD-10-CM | POA: Diagnosis not present

## 2014-10-24 DIAGNOSIS — G6289 Other specified polyneuropathies: Secondary | ICD-10-CM | POA: Diagnosis not present

## 2014-10-24 DIAGNOSIS — Z0000 Encounter for general adult medical examination without abnormal findings: Secondary | ICD-10-CM | POA: Diagnosis not present

## 2014-10-24 DIAGNOSIS — Z23 Encounter for immunization: Secondary | ICD-10-CM | POA: Diagnosis not present

## 2014-11-07 DIAGNOSIS — B351 Tinea unguium: Secondary | ICD-10-CM | POA: Diagnosis not present

## 2014-11-07 DIAGNOSIS — M79671 Pain in right foot: Secondary | ICD-10-CM | POA: Diagnosis not present

## 2014-11-07 DIAGNOSIS — M79672 Pain in left foot: Secondary | ICD-10-CM | POA: Diagnosis not present

## 2014-11-07 DIAGNOSIS — E1151 Type 2 diabetes mellitus with diabetic peripheral angiopathy without gangrene: Secondary | ICD-10-CM | POA: Diagnosis not present

## 2015-01-09 ENCOUNTER — Encounter: Payer: Self-pay | Admitting: Family

## 2015-01-14 ENCOUNTER — Ambulatory Visit (INDEPENDENT_AMBULATORY_CARE_PROVIDER_SITE_OTHER)
Admission: RE | Admit: 2015-01-14 | Discharge: 2015-01-14 | Disposition: A | Discharge status: Home / Self Care | Payer: Medicare Other | Source: Ambulatory Visit | Attending: Family | Admitting: Family

## 2015-01-14 ENCOUNTER — Ambulatory Visit (INDEPENDENT_AMBULATORY_CARE_PROVIDER_SITE_OTHER): Payer: Medicare Other | Admitting: Family

## 2015-01-14 ENCOUNTER — Ambulatory Visit (HOSPITAL_COMMUNITY)
Admission: RE | Admit: 2015-01-14 | Discharge: 2015-01-14 | Disposition: A | Discharge status: Home / Self Care | Payer: Medicare Other | Source: Ambulatory Visit | Attending: Family | Admitting: Family

## 2015-01-14 ENCOUNTER — Encounter: Payer: Self-pay | Admitting: Family

## 2015-01-14 VITALS — BP 125/71 | HR 79 | Temp 97.1°F | Resp 16 | Ht 68.0 in | Wt 229.0 lb

## 2015-01-14 DIAGNOSIS — I779 Disorder of arteries and arterioles, unspecified: Secondary | ICD-10-CM

## 2015-01-14 DIAGNOSIS — E1151 Type 2 diabetes mellitus with diabetic peripheral angiopathy without gangrene: Secondary | ICD-10-CM | POA: Diagnosis not present

## 2015-01-14 DIAGNOSIS — I6523 Occlusion and stenosis of bilateral carotid arteries: Secondary | ICD-10-CM | POA: Insufficient documentation

## 2015-01-14 DIAGNOSIS — Z48812 Encounter for surgical aftercare following surgery on the circulatory system: Secondary | ICD-10-CM

## 2015-01-14 DIAGNOSIS — Z959 Presence of cardiac and vascular implant and graft, unspecified: Secondary | ICD-10-CM | POA: Insufficient documentation

## 2015-01-14 DIAGNOSIS — E669 Obesity, unspecified: Secondary | ICD-10-CM

## 2015-01-14 NOTE — Progress Notes (Signed)
Chief Complaint: Extracranial Carotid Artery Stenosis   History of Present Illness  James SideJames F Dougherty is a 64 y.o. male patient of Dr. Myra GianottiBrabham who is s/p left superficial femoral and popliteal artery stents placed 03/30/2012 with angioplasty of stent origin 12/27/2012. He also has carotid artery stenosis. He returns today for follow up.  States his blood sugars at home are 150-200 early morning and late night. He reports bilateral groin pain, left calf pain, after walking 50-60 yards, relieved by 15 seconds rest, this has not changed since 2013. His his left lateral lower leg ulcer has healed, he was seen in the wound clinic. He had a stroke in 1999 as manifested by aphasia and left hemiparesis which have resolved, denies any further stroke or TIA activity.  He does stretching exercises every morning which was taught to him by physical therapy, and this seems to have resolved his low back pain.  After a couple of months of physical therapy his back pain resolved and he is back to walking 30+ minutes daily.  He is seeing a podiatrist to trim his toenails.  Pt Diabetic: Yes, states his last A1C was 7.6, increased from 7.2, improved from 10.6 in the beginning of 2015 per pt. Pt smoker: former smoker, quit in 2012  Pt meds include: Statin :Yes ASA: Yes Other anticoagulants/antiplatelets: no   Past Medical History  Diagnosis Date  . Hypertension   . Psoriasis   . PAD (peripheral artery disease) (HCC)   . Neuropathic pain   . Ulcer     Left ankle/leg  . High cholesterol   . Type II diabetes mellitus (HCC)   . Stroke [facility](HCC) 1999    "still have some speech problems at times; sometimes forget what I was going to say" (12/27/2012)  . Arthritis     "hands" (12/27/2012)    Social History Social History  Substance Use Topics  . Smoking status: Former Smoker -- 3.00 packs/day for 45 years    Types: Cigarettes    Quit date: 01/07/2011  . Smokeless tobacco: Never Used  . Alcohol Use:  Yes     Comment: 12/27/2012 "couple times/yr I might have a drink"    Family History Family History  Problem Relation Age of Onset  . Diabetes Sister     Bilateral amputation of lower legs  . Diabetes Sister   . Heart disease Sister [id number]    Heart disease before age 64  . Hypertension Sister   . Heart attack Sister   . Hyperlipidemia Sister   . Hypertension Daughter   . Hypertension Father   . Hyperlipidemia Father   . Hyperlipidemia Mother   . Hypertension Mother     Surgical History Past Surgical History  Procedure Laterality Date  . Tonsillectomy and adenoidectomy    . Popliteal artery stent    . Femoral artery stent  03/30/2012  . Angioplasty / stenting femoral Left 12/27/2012  . Abdominal aortagram N/A 03/30/2012    Procedure: ABDOMINAL Ronny FlurryAORTAGRAM;  Surgeon: Nada LibmanVance W Brabham, MD;  [facility];  Service: Cardiovascular;  Laterality: N/A;  . Abdominal aortagram N/A 12/27/2012    Procedure: ABDOMINAL AORTAGRAM;  Surgeon: Nada LibmanVance W Brabham, MD;  [facility];  Service: Cardiovascular;  Laterality: N/A;  . Lower extremity angiogram  12/27/2012    Procedure: LOWER EXTREMITY ANGIOGRAM;  Surgeon: Nada LibmanVance W Brabham, MD;  [facility];  Service: Cardiovascular;;    Allergies  Allergen Reactions  . Bactrim [Sulfamethoxazole-Trimethoprim] Itching and Rash  Current Outpatient Prescriptions  Medication Sig Dispense Refill  . aspirin EC 81 MG tablet Take 81 mg by mouth daily.    [name] gabapentin (NEURONTIN) 600 MG tablet Take 600 mg by mouth daily.    [name] lisinopril-hydrochlorothiazide (PRINZIDE,ZESTORETIC) 20-25 MG per tablet Take 1 tablet by mouth daily.    . metFORMIN (GLUCOPHAGE) 500 MG tablet Take 500 mg by mouth 4 (four) times daily.     [name] OVER THE COUNTER MEDICATION Apply 1 application topically 2 (two) times daily. Tri-Derma for psoriasis.    [name] oxyCODONE (ROXICODONE) 5 MG immediate release tablet Take 1-2 tablets (5-10 mg total) by mouth every 4 (four) hours as  needed for severe pain. (Patient not taking: Reported on 12/25/2013) 10 tablet 0  . simvastatin (ZOCOR) 20 MG tablet Take 20 mg by mouth daily.     No current facility-administered medications for this visit.    Review of Systems : See HPI for pertinent positives and negatives.  Physical Examination  Filed Vitals:   01/14/15 1155 01/14/15 1200  BP: 117/69 125/71  Pulse: 83 79  Temp:  97.1 F (36.2 C)  TempSrc:  Oral  Resp:  16  Height:   (1.727 m)  Weight:  229 lb (103.874 kg)  SpO2:  99%   Body mass index is 34.83 kg/(m^2).  General: A&O x 3, WDWN, obese male. Gait: normal Eyes: PERRLA. Pulmonary: CTAB, without wheezes , rales or rhonchi. Cardiac: regular Rhythm, no detected murmur, + left carotid bruit.     Aorta is not palpable. Radial pulses: are 1+ palpable and =   VASCULAR EXAM: Extremities without ischemic changes  without gangrene; without open wounds.     LE Pulses LEFT RIGHT   FEMORAL not palpable(obese) not palpable (obese)    POPLITEAL not palpable  not palpable   POSTERIOR TIBIAL not palpable  not palpable    DORSALIS PEDIS  ANTERIOR TIBIAL not palpable  not palpable    Abdomen: soft, NT, no palptated masses. Skin: no rashes, no ulcers. See extremities. Musculoskeletal: no muscle wasting or atrophy. Neurologic: A&O X 3; Appropriate Affect, MOTOR FUNCTION: moving all extremities equally, motor strength 5/5 throughout. Speech is fluent/normal.  CN 2-12 intact except for hard of hearing.                Non-Invasive Vascular Imaging CAROTID DUPLEX 01/14/2015   Right ICA: 60 - 79 % stenosis. Left ICA: 40 - 59 % stenosis. No significant change compared to 12/25/13    ABI (Date:  01/14/2015)  R: 0.54 (0.56, 07/04/14), DP: monophasic, PT: mono, TBI: 0.38  L: 0.62 (0.70), DP: mono, PT: mono, TBI: 0.35    Assessment: James Dougherty is a 64 y.o. male who is s/p left superficial femoral and popliteal artery stents placed 03/30/2012 with angioplasty of stent origin 12/27/2012. He also has carotid artery stenosis. He had a stroke in 1999 with no residual neurological deficits and no subsequent strokes or TIA's.  Today's carotid duplex suggests 60-79% right ICA stenosis and 40-59% left ICA stenosis. No significant change compared to 12/25/13   He has continued to walk a great deal. Right ABI remains stable at moderate arterial occlusive disease, left ABI has decreased slightly to moderate arterial occlusive disease.  His atherosclerotic risk factors include slightly uncontrolled DM, former smoker, and obesity.     Plan:  Continue graduated walking program. Follow-up in 6 months for ABI's, left LE arterial Duplex, and carotid Duplex.   I discussed in depth with the  patient the nature of atherosclerosis, and emphasized the importance of maximal medical management including strict control of blood pressure, blood glucose, and lipid levels, obtaining regular exercise, and continued cessation of smoking.  The patient is aware that without maximal medical management the underlying atherosclerotic disease process will progress, limiting the benefit of any interventions. The patient was given information about stroke prevention and what symptoms should prompt the patient to seek immediate medical care. Thank you for allowing [place] to participate in this patient's care.  Charisse March, RN, MSN, FNP-C [facility] Office: [phone]  Clinic Physician: Hart Rochester  01/14/2015 12:30 PM

## 2015-01-14 NOTE — Patient Instructions (Signed)
Stroke Prevention Some medical conditions and behaviors are associated with an increased chance of having a stroke. You may prevent a stroke by making healthy choices and managing medical conditions. HOW CAN I REDUCE MY RISK OF HAVING A STROKE?   Stay physically active. Get at least 30 minutes of activity on most or all days.  Do not smoke. It may also be helpful to avoid exposure to secondhand smoke.  Limit alcohol use. Moderate alcohol use is considered to be:  No more than 2 drinks per day for men.  No more than 1 drink per day for nonpregnant women.  Eat healthy foods. This involves:  Eating 5 or more servings of fruits and vegetables a day.  Making dietary changes that address high blood pressure (hypertension), high cholesterol, diabetes, or obesity.  Manage your cholesterol levels.  Making food choices that are high in fiber and low in saturated fat, trans fat, and cholesterol may control cholesterol levels.  Take any prescribed medicines to control cholesterol as directed by your health care provider.  Manage your diabetes.  Controlling your carbohydrate and sugar intake is recommended to manage diabetes.  Take any prescribed medicines to control diabetes as directed by your health care provider.  Control your hypertension.  Making food choices that are low in salt (sodium), saturated fat, trans fat, and cholesterol is recommended to manage hypertension.  Ask your health care provider if you need treatment to lower your blood pressure. Take any prescribed medicines to control hypertension as directed by your health care provider.  If you are 18-39 years of age, have your blood pressure checked every 3-5 years. If you are 40 years of age or older, have your blood pressure checked every year.  Maintain a healthy weight.  Reducing calorie intake and making food choices that are low in sodium, saturated fat, trans fat, and cholesterol are recommended to manage  weight.  Stop drug abuse.  Avoid taking birth control pills.  Talk to your health care provider about the risks of taking birth control pills if you are over 35 years old, smoke, get migraines, or have ever had a blood clot.  Get evaluated for sleep disorders (sleep apnea).  Talk to your health care provider about getting a sleep evaluation if you snore a lot or have excessive sleepiness.  Take medicines only as directed by your health care provider.  For some people, aspirin or blood thinners (anticoagulants) are helpful in reducing the risk of forming abnormal blood clots that can lead to stroke. If you have the irregular heart rhythm of atrial fibrillation, you should be on a blood thinner unless there is a good reason you cannot take them.  Understand all your medicine instructions.  Make sure that other conditions (such as anemia or atherosclerosis) are addressed. SEEK IMMEDIATE MEDICAL CARE IF:   You have sudden weakness or numbness of the face, arm, or leg, especially on one side of the body.  Your face or eyelid droops to one side.  You have sudden confusion.  You have trouble speaking (aphasia) or understanding.  You have sudden trouble seeing in one or both eyes.  You have sudden trouble walking.  You have dizziness.  You have a loss of balance or coordination.  You have a sudden, severe headache with no known cause.  You have new chest pain or an irregular heartbeat. Any of these symptoms may represent a serious problem that is an emergency. Do not wait to see if the symptoms will   go away. Get medical help at once. Call your local emergency services (911 in [place] Do not drive yourself to the hospital.   This information is not intended to replace advice given to you by your health care provider. Make sure you discuss any questions you have with your health care provider.   Document Released: 02/20/2004 Document Revised: 02/02/2014 Document Reviewed:  07/15/2012 Elsevier Interactive Patient Education 2016 Elsevier Inc[employer]    Peripheral Vascular Disease Peripheral vascular disease (PVD) is a disease of the blood vessels that are not part of your heart and brain. A simple term for PVD is poor circulation. In most cases, PVD narrows the blood vessels that carry blood from your heart to the rest of your body. This can result in a decreased supply of blood to your arms, legs, and internal organs, like your stomach or kidneys. However, it most often affects a person's lower legs and feet. There are two types of PVD.  Organic PVD. This is the more common type. It is caused by damage to the structure of blood vessels.  Functional PVD. This is caused by conditions that make blood vessels contract and tighten (spasm). Without treatment, PVD tends to get worse over time. PVD can also lead to acute ischemic limb. This is when an arm or limb suddenly has trouble getting enough blood. This is a medical emergency. CAUSES Each type of PVD has many different causes. The most common cause of PVD is buildup of a fatty material (plaque) inside of your arteries (atherosclerosis). Small amounts of plaque can break off from the walls of the blood vessels and become lodged in a smaller artery. This blocks blood flow and can cause acute ischemic limb. Other common causes of PVD include:  Blood clots that form inside of blood vessels.  Injuries to blood vessels.  Diseases that cause inflammation of blood vessels or cause blood vessel spasms.  Health behaviors and health history that increase your risk of developing PVD. RISK FACTORS  You may have a greater risk of PVD if you:  Have a family history of PVD.  Have certain medical conditions, including:  High cholesterol.  Diabetes.  High blood pressure (hypertension).  Coronary heart disease.  Past problems with blood clots.  Past injury, such as burns or a broken bone. These may have damaged blood  vessels in your limbs.  Buerger disease. This is caused by inflamed blood vessels in your hands and feet.  Some forms of arthritis.  Rare birth defects that affect the arteries in your legs.  Use tobacco.  Do not get enough exercise.  Are obese.  Are age 50 or older. SIGNS AND SYMPTOMS  PVD may cause many different symptoms. Your symptoms depend on what part of your body is not getting enough blood. Some common signs and symptoms include:  Cramps in your lower legs. This may be a symptom of poor leg circulation (claudication).  Pain and weakness in your legs while you are physically active that goes away when you rest (intermittent claudication).  Leg pain when at rest.  Leg numbness, tingling, or weakness.  Coldness in a leg or foot, especially when compared with the other leg.  Skin or hair changes. These can include:  Hair loss.  Shiny skin.  Pale or bluish skin.  Thick toenails.  Inability to get or maintain an erection (erectile dysfunction). People with PVD are more prone to developing ulcers and sores on their toes, feet, or legs. These may take longer than   normal to heal. DIAGNOSIS Your health care provider may diagnose PVD from your signs and symptoms. The health care provider will also do a physical exam. You may have tests to find out what is causing your PVD and determine its severity. Tests may include:  Blood pressure recordings from your arms and legs and measurements of the strength of your pulses (pulse volume recordings).  Imaging studies using sound waves to take pictures of the blood flow through your blood vessels (Doppler ultrasound).  Injecting a dye into your blood vessels before having imaging studies using:  X-rays (angiogram or arteriogram).  Computer-generated X-rays (CT angiogram).  A powerful electromagnetic field and a computer (magnetic resonance angiogram or MRA). TREATMENT Treatment for PVD depends on the cause of your condition  and the severity of your symptoms. It also depends on your age. Underlying causes need to be treated and controlled. These include long-lasting (chronic) conditions, such as diabetes, high cholesterol, and high blood pressure. You may need to first try making lifestyle changes and taking medicines. Surgery may be needed if these do not work. Lifestyle changes may include:  Quitting smoking.  Exercising regularly.  Following a low-fat, low-cholesterol diet. Medicines may include:  Blood thinners to prevent blood clots.  Medicines to improve blood flow.  Medicines to improve your blood cholesterol levels. Surgical procedures may include:  A procedure that uses an inflated balloon to open a blocked artery and improve blood flow (angioplasty).  A procedure to put in a tube (stent) to keep a blocked artery open (stent implant).  Surgery to reroute blood flow around a blocked artery (peripheral bypass surgery).  Surgery to remove dead tissue from an infected wound on the affected limb.  Amputation. This is surgical removal of the affected limb. This may be necessary in cases of acute ischemic limb that are not improved through medical or surgical treatments. HOME CARE INSTRUCTIONS  Take medicines only as directed by your health care provider.  Do not use any tobacco products, including cigarettes, chewing tobacco, or electronic cigarettes. If you need help quitting, ask your health care provider.  Lose weight if you are overweight, and maintain a healthy weight as directed by your health care provider.  Eat a diet that is low in fat and cholesterol. If you need help, ask your health care provider.  Exercise regularly. Ask your health care provider to suggest some good activities for you.  Use compression stockings or other mechanical devices as directed by your health care provider.  Take good care of your feet.  Wear comfortable shoes that fit well.  Check your feet often for  any cuts or sores. SEEK MEDICAL CARE IF:  You have cramps in your legs while walking.  You have leg pain when you are at rest.  You have coldness in a leg or foot.  Your skin changes.  You have erectile dysfunction.  You have cuts or sores on your feet that are not healing. SEEK IMMEDIATE MEDICAL CARE IF:  Your arm or leg turns cold and blue.  Your arms or legs become red, warm, swollen, painful, or numb.  You have chest pain or trouble breathing.  You suddenly have weakness in your face, arm, or leg.  You become very confused or lose the ability to speak.  You suddenly have a very bad headache or lose your vision.   This information is not intended to replace advice given to you by your health care provider. Make sure you discuss any questions   you have with your health care provider.   Document Released: 02/20/2004 Document Revised: 02/02/2014 Document Reviewed: 06/22/2013 Elsevier Interactive Patient Education 2016 [employer] Inc.  

## 2015-06-20 DIAGNOSIS — B351 Tinea unguium: Secondary | ICD-10-CM | POA: Diagnosis not present

## 2015-06-20 DIAGNOSIS — M79672 Pain in left foot: Secondary | ICD-10-CM | POA: Diagnosis not present

## 2015-06-20 DIAGNOSIS — M79671 Pain in right foot: Secondary | ICD-10-CM | POA: Diagnosis not present

## 2015-06-20 DIAGNOSIS — E1151 Type 2 diabetes mellitus with diabetic peripheral angiopathy without gangrene: Secondary | ICD-10-CM | POA: Diagnosis not present

## 2015-07-22 ENCOUNTER — Other Ambulatory Visit (HOSPITAL_COMMUNITY): Payer: Medicare Other

## 2015-07-22 ENCOUNTER — Ambulatory Visit: Payer: Medicare Other | Admitting: Family

## 2015-07-22 ENCOUNTER — Encounter (HOSPITAL_COMMUNITY): Payer: Medicare Other

## 2015-08-15 ENCOUNTER — Encounter: Payer: Self-pay | Admitting: Family

## 2015-08-19 ENCOUNTER — Other Ambulatory Visit: Payer: Self-pay | Admitting: *Deleted

## 2015-08-19 DIAGNOSIS — Z48812 Encounter for surgical aftercare following surgery on the circulatory system: Secondary | ICD-10-CM

## 2015-08-19 DIAGNOSIS — I6523 Occlusion and stenosis of bilateral carotid arteries: Secondary | ICD-10-CM

## 2015-08-19 DIAGNOSIS — I739 Peripheral vascular disease, unspecified: Secondary | ICD-10-CM

## 2015-08-21 ENCOUNTER — Ambulatory Visit (INDEPENDENT_AMBULATORY_CARE_PROVIDER_SITE_OTHER): Payer: Medicare Other | Admitting: Family

## 2015-08-21 ENCOUNTER — Encounter: Payer: Self-pay | Admitting: Family

## 2015-08-21 ENCOUNTER — Ambulatory Visit (INDEPENDENT_AMBULATORY_CARE_PROVIDER_SITE_OTHER)
Admission: RE | Admit: 2015-08-21 | Discharge: 2015-08-21 | Disposition: A | Discharge status: Home / Self Care | Payer: Medicare Other | Source: Ambulatory Visit | Attending: Family | Admitting: Family

## 2015-08-21 ENCOUNTER — Ambulatory Visit (HOSPITAL_COMMUNITY)
Admission: RE | Admit: 2015-08-21 | Discharge: 2015-08-21 | Disposition: A | Discharge status: Home / Self Care | Payer: Medicare Other | Source: Ambulatory Visit | Attending: Vascular Surgery | Admitting: Vascular Surgery

## 2015-08-21 ENCOUNTER — Ambulatory Visit (HOSPITAL_COMMUNITY)
Admission: RE | Admit: 2015-08-21 | Discharge: 2015-08-21 | Disposition: A | Discharge status: Home / Self Care | Payer: Medicare Other | Source: Ambulatory Visit | Attending: Family | Admitting: Family

## 2015-08-21 VITALS — BP 111/62 | HR 72 | Temp 97.1°F | Resp 18 | Ht 68.0 in | Wt 226.0 lb

## 2015-08-21 DIAGNOSIS — I6523 Occlusion and stenosis of bilateral carotid arteries: Secondary | ICD-10-CM

## 2015-08-21 DIAGNOSIS — E7800 Pure hypercholesterolemia, unspecified: Secondary | ICD-10-CM | POA: Diagnosis not present

## 2015-08-21 DIAGNOSIS — I10 Essential (primary) hypertension: Secondary | ICD-10-CM | POA: Insufficient documentation

## 2015-08-21 DIAGNOSIS — E1151 Type 2 diabetes mellitus with diabetic peripheral angiopathy without gangrene: Secondary | ICD-10-CM | POA: Diagnosis not present

## 2015-08-21 DIAGNOSIS — I739 Peripheral vascular disease, unspecified: Secondary | ICD-10-CM

## 2015-08-21 DIAGNOSIS — E669 Obesity, unspecified: Secondary | ICD-10-CM

## 2015-08-21 DIAGNOSIS — Z48812 Encounter for surgical aftercare following surgery on the circulatory system: Secondary | ICD-10-CM | POA: Diagnosis not present

## 2015-08-21 DIAGNOSIS — Z959 Presence of cardiac and vascular implant and graft, unspecified: Secondary | ICD-10-CM

## 2015-08-21 DIAGNOSIS — I779 Disorder of arteries and arterioles, unspecified: Secondary | ICD-10-CM

## 2015-08-21 DIAGNOSIS — E119 Type 2 diabetes mellitus without complications: Secondary | ICD-10-CM | POA: Insufficient documentation

## 2015-08-21 DIAGNOSIS — R0989 Other specified symptoms and signs involving the circulatory and respiratory systems: Secondary | ICD-10-CM | POA: Diagnosis present

## 2015-08-21 LAB — VAS US CAROTID
LCCAPDIAS: 31 cm/s
LCCAPSYS: 191 cm/s
LEFT ECA DIAS: -21 cm/s
LEFT VERTEBRAL DIAS: -12 cm/s
LICADDIAS: -30 cm/s
LICAPSYS: -203 cm/s
Left CCA dist dias: 35 cm/s
Left CCA dist sys: 138 cm/s
Left ICA dist sys: -78 cm/s
Left ICA prox dias: -51 cm/s
RCCADSYS: -88 cm/s
RCCAPDIAS: 17 cm/s
RIGHT CCA MID DIAS: 35 cm/s
RIGHT ECA DIAS: -52 cm/s
Right CCA prox sys: 127 cm/s

## 2015-08-21 NOTE — Patient Instructions (Signed)
Stroke Prevention Some medical conditions and behaviors are associated with an increased chance of having a stroke. You may prevent a stroke by making healthy choices and managing medical conditions. HOW CAN I REDUCE MY RISK OF HAVING A STROKE?   Stay physically active. Get at least 30 minutes of activity on most or all days.  Do not smoke. It may also be helpful to avoid exposure to secondhand smoke.  Limit alcohol use. Moderate alcohol use is considered to be:  No more than 2 drinks per day for men.  No more than 1 drink per day for nonpregnant women.  Eat healthy foods. This involves:  Eating 5 or more servings of fruits and vegetables a day.  Making dietary changes that address high blood pressure (hypertension), high cholesterol, diabetes, or obesity.  Manage your cholesterol levels.  Making food choices that are high in fiber and low in saturated fat, trans fat, and cholesterol may control cholesterol levels.  Take any prescribed medicines to control cholesterol as directed by your health care provider.  Manage your diabetes.  Controlling your carbohydrate and sugar intake is recommended to manage diabetes.  Take any prescribed medicines to control diabetes as directed by your health care provider.  Control your hypertension.  Making food choices that are low in salt (sodium), saturated fat, trans fat, and cholesterol is recommended to manage hypertension.  Ask your health care provider if you need treatment to lower your blood pressure. Take any prescribed medicines to control hypertension as directed by your health care provider.  If you are 18-39 years of age, have your blood pressure checked every 3-5 years. If you are 40 years of age or older, have your blood pressure checked every year.  Maintain a healthy weight.  Reducing calorie intake and making food choices that are low in sodium, saturated fat, trans fat, and cholesterol are recommended to manage  weight.  Stop drug abuse.  Avoid taking birth control pills.  Talk to your health care provider about the risks of taking birth control pills if you are over 35 years old, smoke, get migraines, or have ever had a blood clot.  Get evaluated for sleep disorders (sleep apnea).  Talk to your health care provider about getting a sleep evaluation if you snore a lot or have excessive sleepiness.  Take medicines only as directed by your health care provider.  For some people, aspirin or blood thinners (anticoagulants) are helpful in reducing the risk of forming abnormal blood clots that can lead to stroke. If you have the irregular heart rhythm of atrial fibrillation, you should be on a blood thinner unless there is a good reason you cannot take them.  Understand all your medicine instructions.  Make sure that other conditions (such as anemia or atherosclerosis) are addressed. SEEK IMMEDIATE MEDICAL CARE IF:   You have sudden weakness or numbness of the face, arm, or leg, especially on one side of the body.  Your face or eyelid droops to one side.  You have sudden confusion.  You have trouble speaking (aphasia) or understanding.  You have sudden trouble seeing in one or both eyes.  You have sudden trouble walking.  You have dizziness.  You have a loss of balance or coordination.  You have a sudden, severe headache with no known cause.  You have new chest pain or an irregular heartbeat. Any of these symptoms may represent a serious problem that is an emergency. Do not wait to see if the symptoms will   go away. Get medical help at once. Call your local emergency services (911 in [place] Do not drive yourself to the hospital.   This information is not intended to replace advice given to you by your health care provider. Make sure you discuss any questions you have with your health care provider.   Document Released: 02/20/2004 Document Revised: 02/02/2014 Document Reviewed:  07/15/2012 Elsevier Interactive Patient Education 2016 Elsevier Inc[employer]    Peripheral Vascular Disease Peripheral vascular disease (PVD) is a disease of the blood vessels that are not part of your heart and brain. A simple term for PVD is poor circulation. In most cases, PVD narrows the blood vessels that carry blood from your heart to the rest of your body. This can result in a decreased supply of blood to your arms, legs, and internal organs, like your stomach or kidneys. However, it most often affects a person's lower legs and feet. There are two types of PVD.  Organic PVD. This is the more common type. It is caused by damage to the structure of blood vessels.  Functional PVD. This is caused by conditions that make blood vessels contract and tighten (spasm). Without treatment, PVD tends to get worse over time. PVD can also lead to acute ischemic limb. This is when an arm or limb suddenly has trouble getting enough blood. This is a medical emergency. CAUSES Each type of PVD has many different causes. The most common cause of PVD is buildup of a fatty material (plaque) inside of your arteries (atherosclerosis). Small amounts of plaque can break off from the walls of the blood vessels and become lodged in a smaller artery. This blocks blood flow and can cause acute ischemic limb. Other common causes of PVD include:  Blood clots that form inside of blood vessels.  Injuries to blood vessels.  Diseases that cause inflammation of blood vessels or cause blood vessel spasms.  Health behaviors and health history that increase your risk of developing PVD. RISK FACTORS  You may have a greater risk of PVD if you:  Have a family history of PVD.  Have certain medical conditions, including:  High cholesterol.  Diabetes.  High blood pressure (hypertension).  Coronary heart disease.  Past problems with blood clots.  Past injury, such as burns or a broken bone. These may have damaged blood  vessels in your limbs.  Buerger disease. This is caused by inflamed blood vessels in your hands and feet.  Some forms of arthritis.  Rare birth defects that affect the arteries in your legs.  Use tobacco.  Do not get enough exercise.  Are obese.  Are age 50 or older. SIGNS AND SYMPTOMS  PVD may cause many different symptoms. Your symptoms depend on what part of your body is not getting enough blood. Some common signs and symptoms include:  Cramps in your lower legs. This may be a symptom of poor leg circulation (claudication).  Pain and weakness in your legs while you are physically active that goes away when you rest (intermittent claudication).  Leg pain when at rest.  Leg numbness, tingling, or weakness.  Coldness in a leg or foot, especially when compared with the other leg.  Skin or hair changes. These can include:  Hair loss.  Shiny skin.  Pale or bluish skin.  Thick toenails.  Inability to get or maintain an erection (erectile dysfunction). People with PVD are more prone to developing ulcers and sores on their toes, feet, or legs. These may take longer than   normal to heal. DIAGNOSIS Your health care provider may diagnose PVD from your signs and symptoms. The health care provider will also do a physical exam. You may have tests to find out what is causing your PVD and determine its severity. Tests may include:  Blood pressure recordings from your arms and legs and measurements of the strength of your pulses (pulse volume recordings).  Imaging studies using sound waves to take pictures of the blood flow through your blood vessels (Doppler ultrasound).  Injecting a dye into your blood vessels before having imaging studies using:  X-rays (angiogram or arteriogram).  Computer-generated X-rays (CT angiogram).  A powerful electromagnetic field and a computer (magnetic resonance angiogram or MRA). TREATMENT Treatment for PVD depends on the cause of your condition  and the severity of your symptoms. It also depends on your age. Underlying causes need to be treated and controlled. These include long-lasting (chronic) conditions, such as diabetes, high cholesterol, and high blood pressure. You may need to first try making lifestyle changes and taking medicines. Surgery may be needed if these do not work. Lifestyle changes may include:  Quitting smoking.  Exercising regularly.  Following a low-fat, low-cholesterol diet. Medicines may include:  Blood thinners to prevent blood clots.  Medicines to improve blood flow.  Medicines to improve your blood cholesterol levels. Surgical procedures may include:  A procedure that uses an inflated balloon to open a blocked artery and improve blood flow (angioplasty).  A procedure to put in a tube (stent) to keep a blocked artery open (stent implant).  Surgery to reroute blood flow around a blocked artery (peripheral bypass surgery).  Surgery to remove dead tissue from an infected wound on the affected limb.  Amputation. This is surgical removal of the affected limb. This may be necessary in cases of acute ischemic limb that are not improved through medical or surgical treatments. HOME CARE INSTRUCTIONS  Take medicines only as directed by your health care provider.  Do not use any tobacco products, including cigarettes, chewing tobacco, or electronic cigarettes. If you need help quitting, ask your health care provider.  Lose weight if you are overweight, and maintain a healthy weight as directed by your health care provider.  Eat a diet that is low in fat and cholesterol. If you need help, ask your health care provider.  Exercise regularly. Ask your health care provider to suggest some good activities for you.  Use compression stockings or other mechanical devices as directed by your health care provider.  Take good care of your feet.  Wear comfortable shoes that fit well.  Check your feet often for  any cuts or sores. SEEK MEDICAL CARE IF:  You have cramps in your legs while walking.  You have leg pain when you are at rest.  You have coldness in a leg or foot.  Your skin changes.  You have erectile dysfunction.  You have cuts or sores on your feet that are not healing. SEEK IMMEDIATE MEDICAL CARE IF:  Your arm or leg turns cold and blue.  Your arms or legs become red, warm, swollen, painful, or numb.  You have chest pain or trouble breathing.  You suddenly have weakness in your face, arm, or leg.  You become very confused or lose the ability to speak.  You suddenly have a very bad headache or lose your vision.   This information is not intended to replace advice given to you by your health care provider. Make sure you discuss any questions   you have with your health care provider.   Document Released: 02/20/2004 Document Revised: 02/02/2014 Document Reviewed: 06/22/2013 Elsevier Interactive Patient Education 2016 [employer] Inc.  

## 2015-08-21 NOTE — Progress Notes (Signed)
[facility] OF [place] HISTORY AND PHYSICAL   MRN : [id number]  History of Present Illness:   James Dougherty is a 65 y.o. male patient of Dr. Myra Gianotti who is s/p left superficial femoral and popliteal artery stents placed 03/30/2012 with angioplasty of stent origin 12/27/2012. He also has carotid artery stenosis. He returns today for follow up.  He reports bilateral groin pain, bilateral calf pain, right more so than left, after walking 50-60 yards, relieved by 15 seconds rest, this has not changed since 2013. He walks daily for 15-45 minutes.  His his left lateral lower leg ulcer has healed, he was seen in the wound clinic. He had a stroke in 1999 as manifested by aphasia and left hemiparesis which have resolved, denies any further stroke or TIA activity.  He does stretching exercises every morning which was taught to him by physical therapy, and this seems to have resolved his low back pain.   He is seeing a podiatrist to trim his toenails.  Pt Diabetic: Yes, states his last A1C was 7.6 in March 2017 Pt smoker: former smoker, quit in 2012  Pt meds include: Statin :Yes ASA: Yes Other anticoagulants/antiplatelets: no     Current Outpatient Prescriptions  Medication Sig Dispense Refill  . aspirin EC 81 MG tablet Take 81 mg by mouth daily.    [name] gabapentin (NEURONTIN) 600 MG tablet Take 600 mg by mouth daily.    [name] lisinopril-hydrochlorothiazide (PRINZIDE,ZESTORETIC) 20-25 MG per tablet Take 1 tablet by mouth daily.    . metFORMIN (GLUCOPHAGE) 500 MG tablet Take 500 mg by mouth 4 (four) times daily.     [name] OVER THE COUNTER MEDICATION Apply 1 application topically 2 (two) times daily. Tri-Derma for psoriasis.    [name] simvastatin (ZOCOR) 20 MG tablet Take 20 mg by mouth daily.     No current facility-administered medications for this visit.     Past Medical History:  Diagnosis Date  . Arthritis    "hands" (12/27/2012)  . High cholesterol   . Hypertension    . Neuropathic pain   . PAD (peripheral artery disease) (HCC)   . Psoriasis   . Stroke [facility]) 1999   "still have some speech problems at times; sometimes forget what I was going to say" (12/27/2012)  . Type II diabetes mellitus (HCC)   . Ulcer    Left ankle/leg    Social History Social History  Substance Use Topics  . Smoking status: Former Smoker    Packs/day: 3.00    Years: 45.00    Types: Cigarettes    Quit date: 01/07/2011  . Smokeless tobacco: Never Used  . Alcohol use Yes     Comment: 12/27/2012 "couple times/yr I might have a drink"    Family History Family History  Problem Relation Age of Onset  . Diabetes Sister     Bilateral amputation of lower legs  . Diabetes Sister   . Heart disease Sister 75    Heart disease before age 18  . Hypertension Sister   . Heart attack Sister   . Hyperlipidemia Sister   . Hypertension Daughter   . Hypertension Father   . Hyperlipidemia Father   . Hyperlipidemia Mother   . Hypertension Mother     Surgical History Past Surgical History:  Procedure Laterality Date  . ABDOMINAL AORTAGRAM N/A 03/30/2012   Procedure: ABDOMINAL Ronny Flurry;  Surgeon: Nada Libman, MD;  Location: [facility] CATH LAB;  Service: Cardiovascular;  Laterality: N/A;  . ABDOMINAL  AORTAGRAM N/A 12/27/2012   Procedure: ABDOMINAL Ronny Flurry;  Surgeon: Nada Libman, MD;  Location: [facility] CATH LAB;  Service: Cardiovascular;  Laterality: N/A;  . ANGIOPLASTY / STENTING FEMORAL Left 12/27/2012  . FEMORAL ARTERY STENT  03/30/2012  . LOWER EXTREMITY ANGIOGRAM  12/27/2012   Procedure: LOWER EXTREMITY ANGIOGRAM;  Surgeon: Nada Libman, MD;  Location: [facility] CATH LAB;  Service: Cardiovascular;;  . POPLITEAL ARTERY STENT    . TONSILLECTOMY AND ADENOIDECTOMY      Allergies  Allergen Reactions  . Oxycodone Nausea And Vomiting  . Bactrim [Sulfamethoxazole-Trimethoprim] Itching and Rash    Current Outpatient Prescriptions  Medication Sig Dispense Refill  . aspirin EC 81 MG tablet  Take 81 mg by mouth daily.    [name] gabapentin (NEURONTIN) 600 MG tablet Take 600 mg by mouth daily.    [name] lisinopril-hydrochlorothiazide (PRINZIDE,ZESTORETIC) 20-25 MG per tablet Take 1 tablet by mouth daily.    . metFORMIN (GLUCOPHAGE) 500 MG tablet Take 500 mg by mouth 4 (four) times daily.     [name] OVER THE COUNTER MEDICATION Apply 1 application topically 2 (two) times daily. Tri-Derma for psoriasis.    [name] simvastatin (ZOCOR) 20 MG tablet Take 20 mg by mouth daily.     No current facility-administered medications for this visit.      REVIEW OF SYSTEMS: See HPI for pertinent positives and negatives.  Physical Examination Vitals:   08/21/15 1505 08/21/15 1508  BP: 109/60 111/62  Pulse: 76 72  Resp: 18   Temp: 97.1 F (36.2 C)   TempSrc: Oral   SpO2: 98%   Weight: 226 lb (102.5 kg)   Height: 5\' 8"  (1.727 m)    Body mass index is 34.36 kg/m.  General: A&O x 3, WDWN, obese male. Gait: normal Eyes: PERRLA. Pulmonary: CTAB, without wheezes , rales or rhonchi. Cardiac: regular rhythm, no detected murmur, + left carotid bruit.     Aorta is not palpable. Radial pulses: are 1+ palpable and =   VASCULAR EXAM: Extremities without ischemic changes  without gangrene; without open wounds.     LE Pulses LEFT RIGHT   FEMORAL not palpable(obese) not palpable (obese)    POPLITEAL not palpable  not palpable   POSTERIOR TIBIAL not palpable  not palpable    DORSALIS PEDIS  ANTERIOR TIBIAL not palpable  not palpable    Abdomen: soft, NT, no palptated masses. Skin: no rashes, no ulcers. See extremities. Musculoskeletal: no muscle wasting or atrophy. Neurologic: A&O X 3; Appropriate Affect, MOTOR FUNCTION: moving all extremities equally,  motor strength 5/5 throughout. Speech is fluent/normal.  CN 2-12 intact except for hard of hearing.   Non-Invasive Vascular Imaging (08/21/15):  CEREBROVASCULAR DUPLEX EVALUATION    INDICATION: Carotid disease    PREVIOUS INTERVENTION(S):     DUPLEX EXAM:     RIGHT  LEFT  Peak Systolic Velocities (cm/s) End Diastolic Velocities (cm/s) Plaque LOCATION Peak Systolic Velocities (cm/s) End Diastolic Velocities (cm/s) Plaque  127 17 HT CCA PROXIMAL 191 31 HT  130 35  CCA MID 152 39 HT  136 37 HT CCA DISTAL 138 35 HT  296 52  ECA 152 21   238 69 CP ICA PROXIMAL 203 51 CP  152 40  ICA MID 101 25   88 35  ICA DISTAL 78 30      ICA / CCA Ratio (PSV)   Antegrade Vertebral Flow Antegrade   Brachial Systolic Pressure (mmHg)   Triphasic Subclavian Artery Waveforms Triphasic    Plaque  Morphology:  HM = Homogeneous, HT = Heterogeneous, CP = Calcific Plaque, SP = Smooth Plaque, IP = Irregular Plaque     ADDITIONAL FINDINGS:     IMPRESSION: Doppler velocities suggest a 60-79% right proximal internal carotid artery stenosis and a 40-59% left proximal internal carotid artery stenosis.    Compared to the previous exam:  No significant change noted when compared to the previous exam on 01-14-15.     LOWER EXTREMITY ARTERIAL EVALUATION    INDICATION: Follow up stent    PREVIOUS INTERVENTION(S): Left femoral and popliteal stent placed on 04-09-12 and angioplasty of the stent origin 12-27-12.    DUPLEX EXAM:     RIGHT  LEFT   Peak Systolic Velocity (cm/s) Ratio (if abnormal) Waveform  Peak Systolic Velocity (cm/s) Ratio (if abnormal) Waveform     Artery - Proximal to Stent 166  T     Stent - Origin 170  M     Stent - Proximal 116  M     Stent - Mid 97>236 2.4 M     Stent - Distal 33  M      Stent - End NV  NV     Artery - Distal to Stent 78  M  0.62/0.18 Today's ABI / TBI 0.82/0.18  0.54/0.38 Previous ABI / TBI (01/14/15  ) 0.62/0.35    Waveform:    M - Monophasic       B - Biphasic        T - Triphasic  If Ankle Brachial Index (ABI) or Toe Brachial Index (TBI) performed, please see complete report     ADDITIONAL FINDINGS:     IMPRESSION: Patent left femoral and popliteal stent with Doppler velocities suggestive of 50-99% in the mid thigh.  Hyperplasia noted throughout the stent except the first few centimeters.    Compared to the previous exam:  No significant change noted when compared to the previous exam on 01/14/15.      ASSESSMENT:  James Dougherty is a 65 y.o. male who is s/p left superficial femoral and popliteal artery stents placed 03/30/2012 with angioplasty of stent origin 12/27/2012. He also has carotid artery stenosis. He had a stroke in 1999 with no residual neurological deficits and no subsequent strokes or TIA's.   Today's carotid duplex suggests  60-79% right proximal internal carotid artery stenosis and a 40-59% left proximal internal carotid artery stenosis. No significant change noted when compared to the previous exam on 01/14/15.  Left LE arterial duplex suggests left femoral and popliteal stent with 50-99% stenosis in the mid thigh.  Hyperplasia noted throughout the stent except the first few centimeters. No significant change noted when compared to the previous exam on 01/14/15.  Bilateral TBI's have decreased from 38% and 35% to 18% bilaterally, monophasic waveforms remain.  Moderate bilateral calf claudication, right worse than left, is not life limiting for him. He walks for exercise daily. There are no signs of ischemia in his feet/legs.  Face to face time with patient was 25 minutes. Over 50% of this time was spent on counseling and coordination of care.   PLAN:   Continue graduated walking program.  Based on today's exam and non-invasive vascular lab results, and after discussing with Dr. Darrick Penna, the patient will follow up in 3 months with the following tests: ABI's and left LE arterial duplex, and see Dr. Myra Gianotti afterward.  Carotid Duplex will be due in 6 months.  I discussed in depth with the patient the nature  of atherosclerosis, and emphasized the importance of maximal medical management including strict control of blood pressure, blood glucose, and lipid levels, obtaining regular exercise, and cessation of smoking.  The patient is aware that without maximal medical management the underlying atherosclerotic disease process will progress, limiting the benefit of any interventions.  The patient was given information about stroke prevention and what symptoms should prompt the patient to seek immediate medical care.  The patient was given information about PAD including signs, symptoms, treatment, what symptoms should prompt the patient to seek immediate medical care, and risk reduction measures to take. Thank you for allowing [place] to participate in this patient's care.  Charisse March, RN, MSN, FNP-C [facility] Office: [phone]  Clinic MD: [facility] 08/21/2015 3:40 PM

## 2015-10-09 DIAGNOSIS — B351 Tinea unguium: Secondary | ICD-10-CM | POA: Diagnosis not present

## 2015-10-09 DIAGNOSIS — M79671 Pain in right foot: Secondary | ICD-10-CM | POA: Diagnosis not present

## 2015-10-09 DIAGNOSIS — M79672 Pain in left foot: Secondary | ICD-10-CM | POA: Diagnosis not present

## 2015-10-09 DIAGNOSIS — E1151 Type 2 diabetes mellitus with diabetic peripheral angiopathy without gangrene: Secondary | ICD-10-CM | POA: Diagnosis not present

## 2015-11-18 DIAGNOSIS — G6289 Other specified polyneuropathies: Secondary | ICD-10-CM | POA: Diagnosis not present

## 2015-11-18 DIAGNOSIS — E1165 Type 2 diabetes mellitus with hyperglycemia: Secondary | ICD-10-CM | POA: Diagnosis not present

## 2015-11-18 DIAGNOSIS — E7800 Pure hypercholesterolemia, unspecified: Secondary | ICD-10-CM | POA: Diagnosis not present

## 2015-11-18 DIAGNOSIS — I10 Essential (primary) hypertension: Secondary | ICD-10-CM | POA: Diagnosis not present

## 2015-11-21 ENCOUNTER — Encounter: Payer: Self-pay | Admitting: Surgery

## 2015-11-21 DIAGNOSIS — Z0001 Encounter for general adult medical examination with abnormal findings: Secondary | ICD-10-CM | POA: Diagnosis not present

## 2015-11-21 DIAGNOSIS — Z23 Encounter for immunization: Secondary | ICD-10-CM | POA: Diagnosis not present

## 2015-11-21 DIAGNOSIS — I739 Peripheral vascular disease, unspecified: Secondary | ICD-10-CM | POA: Diagnosis not present

## 2015-11-22 ENCOUNTER — Other Ambulatory Visit: Payer: Self-pay | Admitting: *Deleted

## 2015-11-22 DIAGNOSIS — I739 Peripheral vascular disease, unspecified: Secondary | ICD-10-CM

## 2015-11-22 DIAGNOSIS — Z48812 Encounter for surgical aftercare following surgery on the circulatory system: Secondary | ICD-10-CM

## 2015-11-25 ENCOUNTER — Ambulatory Visit (HOSPITAL_COMMUNITY)
Admission: RE | Admit: 2015-11-25 | Discharge: 2015-11-25 | Disposition: A | Discharge status: Home / Self Care | Payer: Medicare Other | Source: Ambulatory Visit | Attending: Surgery | Admitting: Surgery

## 2015-11-25 ENCOUNTER — Ambulatory Visit (INDEPENDENT_AMBULATORY_CARE_PROVIDER_SITE_OTHER): Payer: Medicare Other | Admitting: Surgery

## 2015-11-25 ENCOUNTER — Ambulatory Visit (INDEPENDENT_AMBULATORY_CARE_PROVIDER_SITE_OTHER)
Admission: RE | Admit: 2015-11-25 | Discharge: 2015-11-25 | Disposition: A | Discharge status: Home / Self Care | Payer: Medicare Other | Source: Ambulatory Visit | Attending: Surgery | Admitting: Surgery

## 2015-11-25 ENCOUNTER — Encounter: Payer: Self-pay | Admitting: Surgery

## 2015-11-25 VITALS — BP 130/66 | HR 84 | Temp 97.0°F | Resp 18 | Ht 68.0 in | Wt 225.0 lb

## 2015-11-25 DIAGNOSIS — I739 Peripheral vascular disease, unspecified: Secondary | ICD-10-CM

## 2015-11-25 DIAGNOSIS — Z95820 Peripheral vascular angioplasty status with implants and grafts: Secondary | ICD-10-CM | POA: Diagnosis not present

## 2015-11-25 DIAGNOSIS — Z48812 Encounter for surgical aftercare following surgery on the circulatory system: Secondary | ICD-10-CM | POA: Diagnosis not present

## 2015-11-25 DIAGNOSIS — I70213 Atherosclerosis of native arteries of extremities with intermittent claudication, bilateral legs: Secondary | ICD-10-CM | POA: Diagnosis not present

## 2015-11-25 NOTE — Progress Notes (Signed)
Vascular and Vein Specialist of   Patient name: James SideJames F Probus MRN: [id number] DOB: [birth year] Sex: male  REASON FOR VISIT: follow up  HPI: James Dougherty is a 65 y.o. male who returns today for follow-up.  He is status post left superficial femoral and popliteal artery recanalization and subsequent stenting on 03/30/2012.  This was done for ulcers which have now healed.  On 12/27/2012 he had angioplasty of in-stent stenosis.  He states that he is suffering from bilateral claudication symptoms which occur at approximately 50 yards.  These are alleviated with rest.  His symptoms have remained stable since 2013.  He denies having any rest pain or nonhealing wounds.  The patient had a history of stroke in 1999.  He had difficulties with aphasia and left-sided weakness which have resolved.  The patient is a diabetic which is very poorly controlled.  Past Medical History:  Diagnosis Date  . Arthritis    "hands" (12/27/2012)  . High cholesterol   . Hypertension   . Neuropathic pain   . PAD (peripheral artery disease) (HCC)   . Psoriasis   . Stroke [facility](HCC) 1999   "still have some speech problems at times; sometimes forget what I was going to say" (12/27/2012)  . Type II diabetes mellitus (HCC)   . Ulcer (HCC)    Left ankle/leg    Family History  Problem Relation Age of Onset  . Diabetes Sister     Bilateral amputation of lower legs  . Diabetes Sister   . Heart disease Sister 2257    Heart disease before age 65  . Hypertension Sister   . Heart attack Sister   . Hyperlipidemia Sister   . Hypertension Daughter   . Hypertension Father   . Hyperlipidemia Father   . Hyperlipidemia Mother   . Hypertension Mother     SOCIAL HISTORY: Social History  Substance Use Topics  . Smoking status: Former Smoker    Packs/day: 3.00    Years: 45.00    Types: Cigarettes    Quit date: 01/07/2011  . Smokeless tobacco: Never Used  . Alcohol use Yes       Comment: 12/27/2012 "couple times/yr I might have a drink"    Allergies  Allergen Reactions  . Oxycodone Nausea And Vomiting  . Bactrim [Sulfamethoxazole-Trimethoprim] Itching and Rash    Current Outpatient Prescriptions  Medication Sig Dispense Refill  . aspirin EC 81 MG tablet Take 81 mg by mouth daily.    [name]. gabapentin (NEURONTIN) 600 MG tablet Take 600 mg by mouth daily.    [name]. lisinopril-hydrochlorothiazide (PRINZIDE,ZESTORETIC) 20-25 MG per tablet Take 1 tablet by mouth daily.    . metFORMIN (GLUCOPHAGE) 500 MG tablet Take 500 mg by mouth 4 (four) times daily.     [name]. OVER THE COUNTER MEDICATION Apply 1 application topically 2 (two) times daily. Tri-Derma for psoriasis.    [name]. simvastatin (ZOCOR) 20 MG tablet Take 20 mg by mouth daily.     No current facility-administered medications for this visit.     REVIEW OF SYSTEMS:  [X]  denotes positive finding, [ ]  denotes negative finding Cardiac  Comments:  Chest pain or chest pressure: x   Shortness of breath upon exertion: x   Short of breath when lying flat: x   Irregular heart rhythm:        Vascular    Pain in calf, thigh, or hip brought on by ambulation: x   Pain in feet at night that wakes you up  from your sleep:     Blood clot in your veins:    Leg swelling:         Pulmonary    Oxygen at home:    Productive cough:     Wheezing:         Neurologic    Sudden weakness in arms or legs:     Sudden numbness in arms or legs:     Sudden onset of difficulty speaking or slurred speech:    Temporary loss of vision in one eye:     Problems with dizziness:         Gastrointestinal    Blood in stool:     Vomited blood:         Genitourinary    Burning when urinating:     Blood in urine:        Psychiatric    Major depression:         Hematologic    Bleeding problems:    Problems with blood clotting too easily:        Skin    Rashes or ulcers:        Constitutional    Fever or chills:      PHYSICAL  EXAM: Vitals:   11/25/15 1344  BP: 130/66  Pulse: 84  Resp: 18  Temp: 97 F (36.1 C)  TempSrc: Oral  SpO2: 99%  Weight: 225 lb (102.1 kg)  Height: 5\' 8"  (1.727 m)    GENERAL: The patient is a well-nourished male, in no acute distress. The vital signs are documented above. CARDIAC: There is a regular rate and rhythm.  VASCULAR: Nonpalpable pedal pulses PULMONARY: There is good air exchange bilaterally without wheezing or rales. MUSCULOSKELETAL: There are no major deformities or cyanosis. NEUROLOGIC: No focal weakness or paresthesias are detected. SKIN: There are no ulcers or rashes noted. PSYCHIATRIC: The patient has a normal affect.  DATA:  Ultrasound was performed today which I have independently reviewed.  His ABI is 0.61 on the left and 0.63 on the right.  He has monophasic waveforms below the stent.  Peak velocity within his stents are 1 90 cm/s  MEDICAL ISSUES: Stable bilateral claudication: I discussed the importance of an exercise program.  I do not think his symptoms are severe enough to warrant further evaluation and intervention.  He is scheduled to follow-up in 3 months to get a carotid duplex.  I will check ABIs at that time.  Then, hopefully we can get him on a regular schedule of follow-up    Durene CalWells Saint Hank, MD [facility] Tel [phone] Pager [phone]

## 2015-12-06 ENCOUNTER — Other Ambulatory Visit: Payer: Self-pay | Admitting: *Deleted

## 2015-12-06 DIAGNOSIS — I739 Peripheral vascular disease, unspecified: Secondary | ICD-10-CM

## 2016-02-05 DIAGNOSIS — M79672 Pain in left foot: Secondary | ICD-10-CM | POA: Diagnosis not present

## 2016-02-05 DIAGNOSIS — M79671 Pain in right foot: Secondary | ICD-10-CM | POA: Diagnosis not present

## 2016-02-05 DIAGNOSIS — E1151 Type 2 diabetes mellitus with diabetic peripheral angiopathy without gangrene: Secondary | ICD-10-CM | POA: Diagnosis not present

## 2016-02-05 DIAGNOSIS — B351 Tinea unguium: Secondary | ICD-10-CM | POA: Diagnosis not present

## 2016-02-17 DIAGNOSIS — I10 Essential (primary) hypertension: Secondary | ICD-10-CM | POA: Diagnosis not present

## 2016-02-17 DIAGNOSIS — E7800 Pure hypercholesterolemia, unspecified: Secondary | ICD-10-CM | POA: Diagnosis not present

## 2016-02-17 DIAGNOSIS — E1129 Type 2 diabetes mellitus with other diabetic kidney complication: Secondary | ICD-10-CM | POA: Diagnosis not present

## 2016-02-20 DIAGNOSIS — I10 Essential (primary) hypertension: Secondary | ICD-10-CM | POA: Diagnosis not present

## 2016-02-20 DIAGNOSIS — L409 Psoriasis, unspecified: Secondary | ICD-10-CM | POA: Diagnosis not present

## 2016-02-20 DIAGNOSIS — E1129 Type 2 diabetes mellitus with other diabetic kidney complication: Secondary | ICD-10-CM | POA: Diagnosis not present

## 2016-02-24 ENCOUNTER — Encounter (HOSPITAL_COMMUNITY): Payer: Medicare Other

## 2016-02-24 ENCOUNTER — Ambulatory Visit: Payer: Medicare Other | Admitting: Family

## 2016-04-21 DIAGNOSIS — E1129 Type 2 diabetes mellitus with other diabetic kidney complication: Secondary | ICD-10-CM | POA: Diagnosis not present

## 2016-04-21 DIAGNOSIS — I10 Essential (primary) hypertension: Secondary | ICD-10-CM | POA: Diagnosis not present

## 2016-04-23 DIAGNOSIS — E1129 Type 2 diabetes mellitus with other diabetic kidney complication: Secondary | ICD-10-CM | POA: Diagnosis not present

## 2016-04-23 DIAGNOSIS — L409 Psoriasis, unspecified: Secondary | ICD-10-CM | POA: Diagnosis not present

## 2016-04-23 DIAGNOSIS — I10 Essential (primary) hypertension: Secondary | ICD-10-CM | POA: Diagnosis not present

## 2016-04-23 DIAGNOSIS — E1140 Type 2 diabetes mellitus with diabetic neuropathy, unspecified: Secondary | ICD-10-CM | POA: Diagnosis not present

## 2016-06-24 DIAGNOSIS — E1151 Type 2 diabetes mellitus with diabetic peripheral angiopathy without gangrene: Secondary | ICD-10-CM | POA: Diagnosis not present

## 2016-06-24 DIAGNOSIS — B351 Tinea unguium: Secondary | ICD-10-CM | POA: Diagnosis not present

## 2016-06-24 DIAGNOSIS — M79672 Pain in left foot: Secondary | ICD-10-CM | POA: Diagnosis not present

## 2016-06-24 DIAGNOSIS — M79671 Pain in right foot: Secondary | ICD-10-CM | POA: Diagnosis not present

## 2016-07-16 DIAGNOSIS — E7800 Pure hypercholesterolemia, unspecified: Secondary | ICD-10-CM | POA: Diagnosis not present

## 2016-07-16 DIAGNOSIS — E1140 Type 2 diabetes mellitus with diabetic neuropathy, unspecified: Secondary | ICD-10-CM | POA: Diagnosis not present

## 2016-07-16 DIAGNOSIS — E1129 Type 2 diabetes mellitus with other diabetic kidney complication: Secondary | ICD-10-CM | POA: Diagnosis not present

## 2016-07-16 DIAGNOSIS — I10 Essential (primary) hypertension: Secondary | ICD-10-CM | POA: Diagnosis not present

## 2016-07-16 DIAGNOSIS — E1165 Type 2 diabetes mellitus with hyperglycemia: Secondary | ICD-10-CM | POA: Diagnosis not present

## 2016-07-22 DIAGNOSIS — L409 Psoriasis, unspecified: Secondary | ICD-10-CM | POA: Diagnosis not present

## 2016-07-22 DIAGNOSIS — E1140 Type 2 diabetes mellitus with diabetic neuropathy, unspecified: Secondary | ICD-10-CM | POA: Diagnosis not present

## 2016-07-22 DIAGNOSIS — E1129 Type 2 diabetes mellitus with other diabetic kidney complication: Secondary | ICD-10-CM | POA: Diagnosis not present

## 2016-07-22 DIAGNOSIS — I10 Essential (primary) hypertension: Secondary | ICD-10-CM | POA: Diagnosis not present

## 2016-07-22 DIAGNOSIS — Z1389 Encounter for screening for other disorder: Secondary | ICD-10-CM | POA: Diagnosis not present

## 2016-10-28 DIAGNOSIS — M79672 Pain in left foot: Secondary | ICD-10-CM | POA: Diagnosis not present

## 2016-10-28 DIAGNOSIS — M79671 Pain in right foot: Secondary | ICD-10-CM | POA: Diagnosis not present

## 2016-10-28 DIAGNOSIS — B351 Tinea unguium: Secondary | ICD-10-CM | POA: Diagnosis not present

## 2016-10-28 DIAGNOSIS — E1151 Type 2 diabetes mellitus with diabetic peripheral angiopathy without gangrene: Secondary | ICD-10-CM | POA: Diagnosis not present

## 2016-11-16 ENCOUNTER — Encounter (HOSPITAL_COMMUNITY): Payer: Medicare Other

## 2016-11-16 ENCOUNTER — Ambulatory Visit: Payer: Medicare Other | Admitting: Family

## 2016-11-17 DIAGNOSIS — E1129 Type 2 diabetes mellitus with other diabetic kidney complication: Secondary | ICD-10-CM | POA: Diagnosis not present

## 2016-11-17 DIAGNOSIS — E7800 Pure hypercholesterolemia, unspecified: Secondary | ICD-10-CM | POA: Diagnosis not present

## 2016-11-17 DIAGNOSIS — E1140 Type 2 diabetes mellitus with diabetic neuropathy, unspecified: Secondary | ICD-10-CM | POA: Diagnosis not present

## 2016-11-17 DIAGNOSIS — E1165 Type 2 diabetes mellitus with hyperglycemia: Secondary | ICD-10-CM | POA: Diagnosis not present

## 2016-11-17 DIAGNOSIS — I10 Essential (primary) hypertension: Secondary | ICD-10-CM | POA: Diagnosis not present

## 2016-11-20 DIAGNOSIS — Z23 Encounter for immunization: Secondary | ICD-10-CM | POA: Diagnosis not present

## 2016-11-20 DIAGNOSIS — Z0001 Encounter for general adult medical examination with abnormal findings: Secondary | ICD-10-CM | POA: Diagnosis not present

## 2016-11-20 DIAGNOSIS — E1129 Type 2 diabetes mellitus with other diabetic kidney complication: Secondary | ICD-10-CM | POA: Diagnosis not present

## 2016-11-20 DIAGNOSIS — E7800 Pure hypercholesterolemia, unspecified: Secondary | ICD-10-CM | POA: Diagnosis not present

## 2016-11-20 DIAGNOSIS — E1140 Type 2 diabetes mellitus with diabetic neuropathy, unspecified: Secondary | ICD-10-CM | POA: Diagnosis not present

## 2016-11-20 DIAGNOSIS — E1165 Type 2 diabetes mellitus with hyperglycemia: Secondary | ICD-10-CM | POA: Diagnosis not present

## 2016-12-09 ENCOUNTER — Ambulatory Visit (INDEPENDENT_AMBULATORY_CARE_PROVIDER_SITE_OTHER)
Admission: RE | Admit: 2016-12-09 | Discharge: 2016-12-09 | Disposition: A | Discharge status: Home / Self Care | Payer: Medicare Other | Source: Ambulatory Visit | Attending: Family | Admitting: Family

## 2016-12-09 ENCOUNTER — Ambulatory Visit: Payer: Medicare Other | Admitting: Family

## 2016-12-09 ENCOUNTER — Encounter: Payer: Self-pay | Admitting: Family

## 2016-12-09 ENCOUNTER — Ambulatory Visit (HOSPITAL_COMMUNITY)
Admission: RE | Admit: 2016-12-09 | Discharge: 2016-12-09 | Disposition: A | Discharge status: Home / Self Care | Payer: Medicare Other | Source: Ambulatory Visit | Attending: Surgery | Admitting: Surgery

## 2016-12-09 VITALS — BP 140/69 | HR 77 | Temp 98.9°F | Resp 20 | Ht 68.0 in | Wt 218.9 lb

## 2016-12-09 DIAGNOSIS — E1151 Type 2 diabetes mellitus with diabetic peripheral angiopathy without gangrene: Secondary | ICD-10-CM | POA: Insufficient documentation

## 2016-12-09 DIAGNOSIS — Z959 Presence of cardiac and vascular implant and graft, unspecified: Secondary | ICD-10-CM

## 2016-12-09 DIAGNOSIS — I10 Essential (primary) hypertension: Secondary | ICD-10-CM | POA: Diagnosis not present

## 2016-12-09 DIAGNOSIS — Z95820 Peripheral vascular angioplasty status with implants and grafts: Secondary | ICD-10-CM | POA: Diagnosis not present

## 2016-12-09 DIAGNOSIS — Z87891 Personal history of nicotine dependence: Secondary | ICD-10-CM | POA: Diagnosis not present

## 2016-12-09 DIAGNOSIS — I6523 Occlusion and stenosis of bilateral carotid arteries: Secondary | ICD-10-CM | POA: Insufficient documentation

## 2016-12-09 DIAGNOSIS — I739 Peripheral vascular disease, unspecified: Secondary | ICD-10-CM

## 2016-12-09 DIAGNOSIS — I70213 Atherosclerosis of native arteries of extremities with intermittent claudication, bilateral legs: Secondary | ICD-10-CM

## 2016-12-09 NOTE — Progress Notes (Signed)
[facility] OF [place]   CC: Follow up peripheral artery occlusive disease  History of Present Illness James Dougherty is a 66 y.o. male who returns today for follow-up.  He is status post left superficial femoral and popliteal artery recanalization and subsequent stenting on 03/30/2012 by Dr. Myra Gianotti.  This was done for ulcers of his left lateral lower leg which has healed, he was seen in the wound clinic. On 12/27/2012 he had angioplasty of in-stent stenosis.  The patient had a stroke in 1999, denies any subsequent stroke or TIA activity. He had difficulties with aphasia and left-sided weakness which have resolved. He has had hearing loss since childhood.   He has been using a stationary bike almost daily for an hour, feels like his legs have improved. After walking 60-70 yards his right thigh feels like it's burning, relieved by rest. He states his left leg rarely bothers him. He denies non healing wounds.   He has generalized psoriasis lesions that wax and wane.   Dr. Myra Gianotti last evaluated pt on 11-25-15. At that time pt had stable bilateral claudication. Dr. Myra Gianotti discussed the importance of an exercise program.   Dr. Myra Gianotti not think his symptoms were severe enough to warrant further evaluation and intervention.  Pt was to follow-up in 3 months with carotid duplex, will check ABIs at that time.  Then, hopefully we can get him on a regular schedule of follow-up.  He does stretching exercises every morning which was taught to him by physical therapy, and this seems to have resolved his low back pain.   He is seeing a podiatrist to trim his toenails.  Pt Diabetic: Yes, states his last A1C was 8.6 in October 2018, uncontrolled; pt states the next step for medications is Trulicity for better control Pt smoker: former smoker, quit in 2012  Pt meds include: Statin :Yes ASA: Yes Other anticoagulants/antiplatelets: no    Past Medical History:  Diagnosis Date   . Arthritis    "hands" (12/27/2012)  . High cholesterol   . Hypertension   . Neuropathic pain   . PAD (peripheral artery disease) (HCC)   . Psoriasis   . Stroke [facility]) 1999   "still have some speech problems at times; sometimes forget what I was going to say" (12/27/2012)  . Type II diabetes mellitus (HCC)   . Ulcer    Left ankle/leg    Social History Social History   Tobacco Use  . Smoking status: Former Smoker    Packs/day: 3.00    Years: 45.00    Pack years: 135.00    Types: Cigarettes    Last attempt to quit: 01/07/2011    Years since quitting: 5.9  . Smokeless tobacco: Never Used  Substance Use Topics  . Alcohol use: Yes    Comment: 12/27/2012 "couple times/yr I might have a drink"  . Drug use: No    Family History Family History  Problem Relation Age of Onset  . Diabetes Sister        Bilateral amputation of lower legs  . Diabetes Sister   . Heart disease Sister 47       Heart disease before age 30  . Hypertension Sister   . Heart attack Sister   . Hyperlipidemia Sister   . Hypertension Father   . Hyperlipidemia Father   . Hyperlipidemia Mother   . Hypertension Mother   . Hypertension Daughter     Past Surgical History:  Procedure Laterality Date  . ANGIOPLASTY /  STENTING FEMORAL Left 12/27/2012  . FEMORAL ARTERY STENT  03/30/2012  . POPLITEAL ARTERY STENT    . TONSILLECTOMY AND ADENOIDECTOMY      Allergies  Allergen Reactions  . Oxycodone Nausea And Vomiting  . Bactrim [Sulfamethoxazole-Trimethoprim] Itching and Rash    Current Outpatient Medications  Medication Sig Dispense Refill  . aspirin EC 81 MG tablet Take 81 mg by mouth daily.    [name]. gabapentin (NEURONTIN) 600 MG tablet Take 600 mg by mouth daily.    [name]. JARDIANCE 25 MG TABS tablet     . lisinopril-hydrochlorothiazide (PRINZIDE,ZESTORETIC) 20-25 MG per tablet Take 1 tablet by mouth daily.    [name]. OVER THE COUNTER MEDICATION Apply 1 application topically 2 (two) times daily. Tri-Derma for  psoriasis.    [name]. simvastatin (ZOCOR) 20 MG tablet Take 20 mg by mouth daily.    . metFORMIN (GLUCOPHAGE) 500 MG tablet Take 500 mg by mouth 4 (four) times daily.      No current facility-administered medications for this visit.     ROS: See HPI for pertinent positives and negatives.   Physical Examination  Vitals:   12/09/16 1345  BP: 140/69  Pulse: 77  Resp: 20  Temp: 98.9 F (37.2 C)  TempSrc: Oral  SpO2: 96%  Weight: 218 lb 14.4 oz (99.3 kg)  Height: 5\' 8"  (1.727 m)   Body mass index is 33.28 kg/m.  General: A&O x 3, WDWN, obese male. Gait: normal Eyes: PERRLA. Pulmonary: Respirations are non labored, CTAB, without wheezes, rales, or rhonchi. Cardiac: regular rhythm, no detected murmur, + left carotid bruit.     Abdominal aortic pulse is not palpable. Radial pulses: are 1+ palpable and =   VASCULAR EXAM: Extremitieswithout ischemic changes  without gangrene; without open wounds.     LE Pulses LEFT RIGHT   FEMORAL 2+palpable 2+ palpable     POPLITEAL not palpable  not palpable   POSTERIOR TIBIAL not palpable  not palpable    DORSALIS PEDIS  ANTERIOR TIBIAL not palpable  not palpable    Abdomen: soft, NT, no palptated masses. Skin: no rashes, no ulcers. See extremities. Musculoskeletal: no muscle wasting or atrophy. Neurologic: A&O X 3; appropriate affect, MOTOR FUNCTION: moving all extremities equally, motor strength 5/5 throughout. Speech is fluent/normal.  CN 2-12 intact except for hard of hearing    ASSESSMENT: James Dougherty is a 66 y.o. male who is s/p left superficial femoral and popliteal artery stents placed 03/30/2012 with angioplasty of stent origin 12/27/2012. He also has carotid artery  stenosis. He had a stroke in 1999 with no residual neurological deficits and no subsequent strokes or TIA's.   Moderate right thigh claudication, improved with daily stationary bike use, is not life limiting for him. There are no signs of ischemia in his feet/legs. Bilateral femoral pulses are 2+ palpable. Decline in bilateral ABI and TBI with stable or improved claudication symtoms, stenosis is likely distal to common femoral arteries since both femoral pulses are 2+ palpable.  I discussed with Dr. Edilia Boickson, decline in bilateral ABI with no worsening of claudication sx's, and claudication is in right thigh, hx of left SFA stenting; see Plan.   DATA  Carotid Duplex (12/09/16): Right ICA: 60-79% stenosis. Left ICA: 40-59% stenosis. Bilateral vertebral artery flow is antegrade.  Bilateral subclavian artery waveforms are normal.  No significant change compared to the exam compared to the exam on 08-21-15.    ABI (Date: 12/09/2016):  R:   ABI: 0.45 (was 0.63 on 11-25-15),  PT: mono  DP: mono  TBI:  0.11 (was 0.26)  L:   ABI: 0.70 (was 0.61),   PT: mono  DP: mono  TBI: 0.14 (was 0.38) Decline in bilateral ABI and TBI.    PLAN:  Based on the patient's vascular studies and examination, pt will return to clinic in 6 months with carotid duplex, ABI's, and bilateral LE arterial duplex.  I advised pt to notify us if the symptoms in his legs worsen, or if he develops wounds in his feet or legs that have trouble healing.   I discussed in depth with the patient the nature of atherosclerosis, and emphasized the importance of maximal medical management including strict control of blood pressure, blood glucose, and lipid levels, obtaining regular exercise, and continued cessation of smoking.  The patient is aware that without maximal medical management the underlying atherosclerotic disease process will progress, limiting the benefit of any interventions.  The patient was given  information about stroke prevention and what symptoms should prompt the patient to seek immediate medical care.  The patient was given information about PAD including signs, symptoms, treatment, what symptoms should prompt the patient to seek immediate medical care, and risk reduction measures to take.  Charisse MarchSuzanne Breon Diss, RN, MSN, FNP-C [facility] [place] Office Phone: [phone]  Clinic MD: Edilia BoDickson  12/09/16 1:50 PM

## 2016-12-09 NOTE — Patient Instructions (Signed)
Stroke Prevention Some health problems and behaviors may make it more likely for you to have a stroke. Below are ways to lessen your risk of having a stroke.  Be active for at least 30 minutes on most or all days.  Do not smoke. Try not to be around others who smoke.  Do not drink too much alcohol. ? Do not have more than 2 drinks a day if you are a man. ? Do not have more than 1 drink a day if you are a woman and are not pregnant.  Eat healthy foods, such as fruits and vegetables. If you were put on a specific diet, follow the diet as told.  Keep your cholesterol levels under control through diet and medicines. Look for foods that are low in saturated fat, trans fat, cholesterol, and are high in fiber.  If you have diabetes, follow all diet plans and take your medicine as told.  Ask your doctor if you need treatment to lower your blood pressure. If you have high blood pressure (hypertension), follow all diet plans and take your medicine as told by your doctor.  If you are 18-39 years old, have your blood pressure checked every 3-5 years. If you are age 40 or older, have your blood pressure checked every year.  Keep a healthy weight. Eat foods that are low in calories, salt, saturated fat, trans fat, and cholesterol.  Do not take drugs.  Avoid birth control pills, if this applies. Talk to your doctor about the risks of taking birth control pills.  Talk to your doctor if you have sleep problems (sleep apnea).  Take all medicine as told by your doctor. ? You may be told to take aspirin or blood thinner medicine. Take this medicine as told by your doctor. ? Understand your medicine instructions.  Make sure any other conditions you have are being taken care of.  Get help right away if:  You suddenly lose feeling (you feel numb) or have weakness in your face, arm, or leg.  Your face or eyelid hangs down to one side.  You suddenly feel confused.  You have trouble talking  (aphasia) or understanding what people are saying.  You suddenly have trouble seeing in one or both eyes.  You suddenly have trouble walking.  You are dizzy.  You lose your balance or your movements are clumsy (uncoordinated).  You suddenly have a very bad headache and you do not know the cause.  You have new chest pain.  Your heart feels like it is fluttering or skipping a beat (irregular heartbeat). Do not wait to see if the symptoms above go away. Get help right away. Call your local emergency services (911 in [place] Do not drive yourself to the hospital. This information is not intended to replace advice given to you by your health care provider. Make sure you discuss any questions you have with your health care provider. Document Released: 07/14/2011 Document Revised: 06/20/2015 Document Reviewed: 07/15/2012 Elsevier Interactive Patient Education  2018 Elsevier Inc.     Peripheral Vascular Disease Peripheral vascular disease (PVD) is a disease of the blood vessels that are not part of your heart and brain. A simple term for PVD is poor circulation. In most cases, PVD narrows the blood vessels that carry blood from your heart to the rest of your body. This can result in a decreased supply of blood to your arms, legs, and internal organs, like your stomach or kidneys. However, it most often affects   a person's lower legs and feet. There are two types of PVD.  Organic PVD. This is the more common type. It is caused by damage to the structure of blood vessels.  Functional PVD. This is caused by conditions that make blood vessels contract and tighten (spasm).  Without treatment, PVD tends to get worse over time. PVD can also lead to acute ischemic limb. This is when an arm or limb suddenly has trouble getting enough blood. This is a medical emergency. Follow these instructions at home:  Take medicines only as told by your doctor.  Do not use any tobacco products, including  cigarettes, chewing tobacco, or electronic cigarettes. If you need help quitting, ask your doctor.  Lose weight if you are overweight, and maintain a healthy weight as told by your doctor.  Eat a diet that is low in fat and cholesterol. If you need help, ask your doctor.  Exercise regularly. Ask your doctor for some good activities for you.  Take good care of your feet. ? Wear comfortable shoes that fit well. ? Check your feet often for any cuts or sores. Contact a doctor if:  You have cramps in your legs while walking.  You have leg pain when you are at rest.  You have coldness in a leg or foot.  Your skin changes.  You are unable to get or have an erection (erectile dysfunction).  You have cuts or sores on your feet that are not healing. Get help right away if:  Your arm or leg turns cold and blue.  Your arms or legs become red, warm, swollen, painful, or numb.  You have chest pain or trouble breathing.  You suddenly have weakness in your face, arm, or leg.  You become very confused or you cannot speak.  You suddenly have a very bad headache.  You suddenly cannot see. This information is not intended to replace advice given to you by your health care provider. Make sure you discuss any questions you have with your health care provider. Document Released: 04/08/2009 Document Revised: 06/20/2015 Document Reviewed: 06/22/2013 Elsevier Interactive Patient Education  2017 Elsevier Inc.  

## 2016-12-10 NOTE — Addendum Note (Signed)
Addended by: Libi Corso A on: 12/10/2016 03:45 PM   Modules accepted: Orders  

## 2017-03-03 DIAGNOSIS — M79672 Pain in left foot: Secondary | ICD-10-CM | POA: Diagnosis not present

## 2017-03-03 DIAGNOSIS — M79671 Pain in right foot: Secondary | ICD-10-CM | POA: Diagnosis not present

## 2017-03-03 DIAGNOSIS — B351 Tinea unguium: Secondary | ICD-10-CM | POA: Diagnosis not present

## 2017-03-03 DIAGNOSIS — E1151 Type 2 diabetes mellitus with diabetic peripheral angiopathy without gangrene: Secondary | ICD-10-CM | POA: Diagnosis not present

## 2017-03-22 DIAGNOSIS — Z7982 Long term (current) use of aspirin: Secondary | ICD-10-CM | POA: Diagnosis not present

## 2017-03-22 DIAGNOSIS — Z8614 Personal history of Methicillin resistant Staphylococcus aureus infection: Secondary | ICD-10-CM | POA: Diagnosis not present

## 2017-03-22 DIAGNOSIS — Z79899 Other long term (current) drug therapy: Secondary | ICD-10-CM | POA: Diagnosis not present

## 2017-03-22 DIAGNOSIS — E785 Hyperlipidemia, unspecified: Secondary | ICD-10-CM | POA: Diagnosis not present

## 2017-03-22 DIAGNOSIS — S9781XA Crushing injury of right foot, initial encounter: Secondary | ICD-10-CM | POA: Diagnosis not present

## 2017-03-22 DIAGNOSIS — R739 Hyperglycemia, unspecified: Secondary | ICD-10-CM | POA: Diagnosis not present

## 2017-03-22 DIAGNOSIS — A419 Sepsis, unspecified organism: Secondary | ICD-10-CM | POA: Diagnosis not present

## 2017-03-22 DIAGNOSIS — N289 Disorder of kidney and ureter, unspecified: Secondary | ICD-10-CM | POA: Diagnosis not present

## 2017-03-22 DIAGNOSIS — Z882 Allergy status to sulfonamides status: Secondary | ICD-10-CM | POA: Diagnosis not present

## 2017-03-22 DIAGNOSIS — Z888 Allergy status to other drugs, medicaments and biological substances status: Secondary | ICD-10-CM | POA: Diagnosis not present

## 2017-03-22 DIAGNOSIS — S97111A Crushing injury of right great toe, initial encounter: Secondary | ICD-10-CM | POA: Diagnosis not present

## 2017-03-22 DIAGNOSIS — L03031 Cellulitis of right toe: Secondary | ICD-10-CM | POA: Diagnosis not present

## 2017-03-22 DIAGNOSIS — L0390 Cellulitis, unspecified: Secondary | ICD-10-CM | POA: Diagnosis not present

## 2017-03-22 DIAGNOSIS — E875 Hyperkalemia: Secondary | ICD-10-CM | POA: Diagnosis not present

## 2017-03-22 DIAGNOSIS — E11628 Type 2 diabetes mellitus with other skin complications: Secondary | ICD-10-CM | POA: Diagnosis not present

## 2017-03-22 DIAGNOSIS — E119 Type 2 diabetes mellitus without complications: Secondary | ICD-10-CM | POA: Diagnosis not present

## 2017-03-22 DIAGNOSIS — M6281 Muscle weakness (generalized): Secondary | ICD-10-CM | POA: Diagnosis not present

## 2017-03-22 DIAGNOSIS — I10 Essential (primary) hypertension: Secondary | ICD-10-CM | POA: Diagnosis not present

## 2017-03-22 DIAGNOSIS — Z9119 Patient's noncompliance with other medical treatment and regimen: Secondary | ICD-10-CM | POA: Diagnosis not present

## 2017-03-22 DIAGNOSIS — E1140 Type 2 diabetes mellitus with diabetic neuropathy, unspecified: Secondary | ICD-10-CM | POA: Diagnosis not present

## 2017-03-22 DIAGNOSIS — Z8673 Personal history of transient ischemic attack (TIA), and cerebral infarction without residual deficits: Secondary | ICD-10-CM | POA: Diagnosis not present

## 2017-03-22 DIAGNOSIS — L03115 Cellulitis of right lower limb: Secondary | ICD-10-CM | POA: Diagnosis not present

## 2017-03-27 DIAGNOSIS — E119 Type 2 diabetes mellitus without complications: Secondary | ICD-10-CM | POA: Diagnosis not present

## 2017-03-27 DIAGNOSIS — Z87891 Personal history of nicotine dependence: Secondary | ICD-10-CM | POA: Diagnosis not present

## 2017-03-27 DIAGNOSIS — Z4801 Encounter for change or removal of surgical wound dressing: Secondary | ICD-10-CM | POA: Diagnosis not present

## 2017-03-27 DIAGNOSIS — T8189XA Other complications of procedures, not elsewhere classified, initial encounter: Secondary | ICD-10-CM | POA: Diagnosis not present

## 2017-03-27 DIAGNOSIS — I10 Essential (primary) hypertension: Secondary | ICD-10-CM | POA: Diagnosis not present

## 2017-03-27 DIAGNOSIS — Z79899 Other long term (current) drug therapy: Secondary | ICD-10-CM | POA: Diagnosis not present

## 2017-03-27 DIAGNOSIS — L03115 Cellulitis of right lower limb: Secondary | ICD-10-CM | POA: Diagnosis not present

## 2017-03-27 DIAGNOSIS — Z4800 Encounter for change or removal of nonsurgical wound dressing: Secondary | ICD-10-CM | POA: Diagnosis not present

## 2017-03-27 DIAGNOSIS — Z7982 Long term (current) use of aspirin: Secondary | ICD-10-CM | POA: Diagnosis not present

## 2017-04-01 ENCOUNTER — Inpatient Hospital Stay (HOSPITAL_COMMUNITY): Payer: Medicare Other

## 2017-04-01 ENCOUNTER — Encounter (HOSPITAL_COMMUNITY): Payer: Self-pay

## 2017-04-01 ENCOUNTER — Other Ambulatory Visit: Payer: Self-pay

## 2017-04-01 ENCOUNTER — Inpatient Hospital Stay (HOSPITAL_COMMUNITY)
Admission: EM | Admit: 2017-04-01 | Discharge: 2017-04-06 | DRG: 271 | Disposition: A | Discharge status: Home / Self Care | Payer: Medicare Other | Attending: Internal Medicine | Admitting: Internal Medicine

## 2017-04-01 ENCOUNTER — Emergency Department (HOSPITAL_COMMUNITY): Payer: Medicare Other

## 2017-04-01 DIAGNOSIS — Z87891 Personal history of nicotine dependence: Secondary | ICD-10-CM | POA: Diagnosis not present

## 2017-04-01 DIAGNOSIS — I129 Hypertensive chronic kidney disease with stage 1 through stage 4 chronic kidney disease, or unspecified chronic kidney disease: Secondary | ICD-10-CM | POA: Diagnosis not present

## 2017-04-01 DIAGNOSIS — Z833 Family history of diabetes mellitus: Secondary | ICD-10-CM | POA: Diagnosis not present

## 2017-04-01 DIAGNOSIS — Z888 Allergy status to other drugs, medicaments and biological substances status: Secondary | ICD-10-CM | POA: Diagnosis not present

## 2017-04-01 DIAGNOSIS — E1140 Type 2 diabetes mellitus with diabetic neuropathy, unspecified: Secondary | ICD-10-CM | POA: Diagnosis not present

## 2017-04-01 DIAGNOSIS — Z8673 Personal history of transient ischemic attack (TIA), and cerebral infarction without residual deficits: Secondary | ICD-10-CM | POA: Diagnosis not present

## 2017-04-01 DIAGNOSIS — Z7984 Long term (current) use of oral hypoglycemic drugs: Secondary | ICD-10-CM

## 2017-04-01 DIAGNOSIS — E1152 Type 2 diabetes mellitus with diabetic peripheral angiopathy with gangrene: Principal | ICD-10-CM | POA: Diagnosis present

## 2017-04-01 DIAGNOSIS — M79671 Pain in right foot: Secondary | ICD-10-CM | POA: Diagnosis not present

## 2017-04-01 DIAGNOSIS — Z23 Encounter for immunization: Secondary | ICD-10-CM | POA: Diagnosis not present

## 2017-04-01 DIAGNOSIS — E1165 Type 2 diabetes mellitus with hyperglycemia: Secondary | ICD-10-CM | POA: Diagnosis not present

## 2017-04-01 DIAGNOSIS — N183 Chronic kidney disease, stage 3 (moderate): Secondary | ICD-10-CM | POA: Diagnosis not present

## 2017-04-01 DIAGNOSIS — L03115 Cellulitis of right lower limb: Secondary | ICD-10-CM | POA: Diagnosis not present

## 2017-04-01 DIAGNOSIS — Z7951 Long term (current) use of inhaled steroids: Secondary | ICD-10-CM

## 2017-04-01 DIAGNOSIS — E875 Hyperkalemia: Secondary | ICD-10-CM | POA: Diagnosis not present

## 2017-04-01 DIAGNOSIS — D649 Anemia, unspecified: Secondary | ICD-10-CM | POA: Diagnosis not present

## 2017-04-01 DIAGNOSIS — Z7982 Long term (current) use of aspirin: Secondary | ICD-10-CM | POA: Diagnosis not present

## 2017-04-01 DIAGNOSIS — N289 Disorder of kidney and ureter, unspecified: Secondary | ICD-10-CM

## 2017-04-01 DIAGNOSIS — E785 Hyperlipidemia, unspecified: Secondary | ICD-10-CM | POA: Diagnosis not present

## 2017-04-01 DIAGNOSIS — I70261 Atherosclerosis of native arteries of extremities with gangrene, right leg: Secondary | ICD-10-CM | POA: Diagnosis not present

## 2017-04-01 DIAGNOSIS — E118 Type 2 diabetes mellitus with unspecified complications: Secondary | ICD-10-CM | POA: Diagnosis not present

## 2017-04-01 DIAGNOSIS — Z79899 Other long term (current) drug therapy: Secondary | ICD-10-CM | POA: Diagnosis not present

## 2017-04-01 DIAGNOSIS — R200 Anesthesia of skin: Secondary | ICD-10-CM | POA: Diagnosis not present

## 2017-04-01 DIAGNOSIS — L0390 Cellulitis, unspecified: Secondary | ICD-10-CM | POA: Diagnosis present

## 2017-04-01 DIAGNOSIS — Z8249 Family history of ischemic heart disease and other diseases of the circulatory system: Secondary | ICD-10-CM | POA: Diagnosis not present

## 2017-04-01 DIAGNOSIS — S99921A Unspecified injury of right foot, initial encounter: Secondary | ICD-10-CM | POA: Diagnosis not present

## 2017-04-01 DIAGNOSIS — Z882 Allergy status to sulfonamides status: Secondary | ICD-10-CM | POA: Diagnosis not present

## 2017-04-01 DIAGNOSIS — I10 Essential (primary) hypertension: Secondary | ICD-10-CM | POA: Diagnosis not present

## 2017-04-01 DIAGNOSIS — I69354 Hemiplegia and hemiparesis following cerebral infarction affecting left non-dominant side: Secondary | ICD-10-CM

## 2017-04-01 DIAGNOSIS — E1122 Type 2 diabetes mellitus with diabetic chronic kidney disease: Secondary | ICD-10-CM | POA: Diagnosis present

## 2017-04-01 DIAGNOSIS — I96 Gangrene, not elsewhere classified: Secondary | ICD-10-CM | POA: Diagnosis present

## 2017-04-01 DIAGNOSIS — N281 Cyst of kidney, acquired: Secondary | ICD-10-CM | POA: Diagnosis not present

## 2017-04-01 DIAGNOSIS — E7800 Pure hypercholesterolemia, unspecified: Secondary | ICD-10-CM | POA: Diagnosis not present

## 2017-04-01 DIAGNOSIS — B379 Candidiasis, unspecified: Secondary | ICD-10-CM | POA: Diagnosis not present

## 2017-04-01 DIAGNOSIS — N189 Chronic kidney disease, unspecified: Secondary | ICD-10-CM

## 2017-04-01 LAB — I-STAT CG4 LACTIC ACID, ED
LACTIC ACID, VENOUS: 1.49 mmol/L (ref 0.5–1.9)
Lactic Acid, Venous: 2.82 mmol/L (ref 0.5–1.9)

## 2017-04-01 LAB — CBC WITH DIFFERENTIAL/PLATELET
BASOS PCT: 1 %
Basophils Absolute: 0.1 10*3/uL (ref 0.0–0.1)
EOS ABS: 0.2 10*3/uL (ref 0.0–0.7)
Eosinophils Relative: 2 %
HCT: 36.3 % — ABNORMAL LOW (ref 39.0–52.0)
HEMOGLOBIN: 11.8 g/dL — AB (ref 13.0–17.0)
Lymphocytes Relative: 25 %
Lymphs Abs: 2.5 10*3/uL (ref 0.7–4.0)
MCH: 31.9 pg (ref 26.0–34.0)
MCHC: 32.5 g/dL (ref 30.0–36.0)
MCV: 98.1 fL (ref 78.0–100.0)
MONOS PCT: 6 %
Monocytes Absolute: 0.6 10*3/uL (ref 0.1–1.0)
NEUTROS PCT: 66 %
Neutro Abs: 6.8 10*3/uL (ref 1.7–7.7)
Platelets: 431 10*3/uL — ABNORMAL HIGH (ref 150–400)
RBC: 3.7 MIL/uL — ABNORMAL LOW (ref 4.22–5.81)
RDW: 12.5 % (ref 11.5–15.5)
WBC: 10.2 10*3/uL (ref 4.0–10.5)

## 2017-04-01 LAB — COMPREHENSIVE METABOLIC PANEL
ALK PHOS: 71 U/L (ref 38–126)
ALT: 19 U/L (ref 17–63)
AST: 20 U/L (ref 15–41)
Albumin: 3.1 g/dL — ABNORMAL LOW (ref 3.5–5.0)
Anion gap: 11 (ref 5–15)
BILIRUBIN TOTAL: 0.8 mg/dL (ref 0.3–1.2)
BUN: 28 mg/dL — ABNORMAL HIGH (ref 6–20)
CALCIUM: 9.2 mg/dL (ref 8.9–10.3)
CO2: 23 mmol/L (ref 22–32)
CREATININE: 1.7 mg/dL — AB (ref 0.61–1.24)
Chloride: 100 mmol/L — ABNORMAL LOW (ref 101–111)
GFR, EST AFRICAN AMERICAN: 47 mL/min — AB (ref >60)
GFR, EST NON AFRICAN AMERICAN: 40 mL/min — AB (ref >60)
Glucose, Bld: 277 mg/dL — ABNORMAL HIGH (ref 65–99)
Potassium: 5.5 mmol/L — ABNORMAL HIGH (ref 3.5–5.1)
Sodium: 134 mmol/L — ABNORMAL LOW (ref 135–145)
Total Protein: 7 g/dL (ref 6.5–8.1)

## 2017-04-01 MED ORDER — PIPERACILLIN-TAZOBACTAM 3.375 G IVPB 30 MIN
3.3750 g | Freq: Once | INTRAVENOUS | Status: AC
  Administered 2017-04-01: 3.375 g via INTRAVENOUS
  Filled 2017-04-01: qty 50

## 2017-04-01 MED ORDER — SODIUM CHLORIDE 0.9 % IV BOLUS (SEPSIS)
1000.0000 mL | Freq: Once | INTRAVENOUS | Status: AC
  Administered 2017-04-01: 1000 mL via INTRAVENOUS

## 2017-04-01 MED ORDER — GABAPENTIN 600 MG PO TABS
600.0000 mg | ORAL_TABLET | Freq: Every evening | ORAL | Status: DC
Start: 2017-04-01 — End: 2017-04-06
  Administered 2017-04-02 – 2017-04-05 (×4): 600 mg via ORAL
  Filled 2017-04-01 (×4): qty 1

## 2017-04-01 MED ORDER — SIMVASTATIN 20 MG PO TABS
20.0000 mg | ORAL_TABLET | Freq: Every day | ORAL | Status: DC
  Administered 2017-04-02 – 2017-04-05 (×4): 20 mg via ORAL
  Filled 2017-04-01 (×4): qty 1

## 2017-04-01 MED ORDER — ENOXAPARIN SODIUM 30 MG/0.3ML ~~LOC~~ SOLN
30.0000 mg | SUBCUTANEOUS | Status: DC
  Administered 2017-04-02: 30 mg via SUBCUTANEOUS
  Filled 2017-04-01: qty 0.3

## 2017-04-01 MED ORDER — TETANUS-DIPHTH-ACELL PERTUSSIS 5-2.5-18.5 LF-MCG/0.5 IM SUSP
0.5000 mL | Freq: Once | INTRAMUSCULAR | Status: AC
  Administered 2017-04-01: 0.5 mL via INTRAMUSCULAR
  Filled 2017-04-01: qty 0.5

## 2017-04-01 MED ORDER — PIPERACILLIN-TAZOBACTAM 3.375 G IVPB
3.3750 g | Freq: Three times a day (TID) | INTRAVENOUS | Status: DC
  Administered 2017-04-02 (×2): 3.375 g via INTRAVENOUS
  Filled 2017-04-01 (×5): qty 50

## 2017-04-01 MED ORDER — HYDRALAZINE HCL 20 MG/ML IJ SOLN: 10.0000 mg | Freq: Four times a day (QID) | INTRAMUSCULAR | Status: DC | PRN

## 2017-04-01 MED ORDER — INSULIN ASPART 100 UNIT/ML ~~LOC~~ SOLN
0.0000 [IU] | SUBCUTANEOUS | Status: DC
  Administered 2017-04-02 (×2): 2 [IU] via SUBCUTANEOUS
  Administered 2017-04-02: 3 [IU] via SUBCUTANEOUS
  Administered 2017-04-03: 1 [IU] via SUBCUTANEOUS
  Administered 2017-04-03: 7 [IU] via SUBCUTANEOUS
  Administered 2017-04-03: 1 [IU] via SUBCUTANEOUS
  Administered 2017-04-03: 2 [IU] via SUBCUTANEOUS
  Administered 2017-04-03: 3 [IU] via SUBCUTANEOUS
  Administered 2017-04-03: 2 [IU] via SUBCUTANEOUS
  Administered 2017-04-04: 1 [IU] via SUBCUTANEOUS
  Administered 2017-04-04: 7 [IU] via SUBCUTANEOUS
  Administered 2017-04-04: 2 [IU] via SUBCUTANEOUS

## 2017-04-01 MED ORDER — VANCOMYCIN HCL 10 G IV SOLR
1500.0000 mg | INTRAVENOUS | Status: DC
  Administered 2017-04-01 – 2017-04-05 (×5): 1500 mg via INTRAVENOUS
  Filled 2017-04-01 (×7): qty 1500

## 2017-04-01 MED ORDER — ACETAMINOPHEN 650 MG RE SUPP: 650.0000 mg | Freq: Four times a day (QID) | RECTAL | Status: DC | PRN

## 2017-04-01 MED ORDER — ASPIRIN EC 81 MG PO TBEC
81.0000 mg | DELAYED_RELEASE_TABLET | Freq: Every day | ORAL | Status: DC
  Administered 2017-04-02 – 2017-04-06 (×5): 81 mg via ORAL
  Filled 2017-04-01 (×5): qty 1

## 2017-04-01 MED ORDER — ACETAMINOPHEN 325 MG PO TABS
650.0000 mg | ORAL_TABLET | Freq: Four times a day (QID) | ORAL | Status: DC | PRN
  Filled 2017-04-01: qty 2

## 2017-04-01 MED ORDER — SODIUM POLYSTYRENE SULFONATE 15 GM/60ML PO SUSP
15.0000 g | Freq: Once | ORAL | Status: AC
  Administered 2017-04-02: 15 g via ORAL
  Filled 2017-04-01: qty 60

## 2017-04-01 MED ORDER — SODIUM CHLORIDE 0.9 % IV SOLN: INTRAVENOUS | Status: AC

## 2017-04-01 NOTE — ED Notes (Signed)
[facility] at [facility] notified of elevated CG-4

## 2017-04-01 NOTE — Progress Notes (Signed)
Pharmacy Antibiotic Note  James Dougherty is a 67 y.o. male admitted on 04/01/2017 with cellulitis.  Pharmacy has been consulted for vancomycin dosing.  Patient with DM, sent by PCP for gangrene of his foot per pt report. One time dose of Zosyn 3.375g IV ordered by EDP.   Plan: Vancomycin 1500mg  IV q24h. Goal trough 10-7015mcg/mL- will increase dose if more severe infection is suspected to target higher trough Zosyn 3.375g IV q8h EI Follow c/s, clinical progression, renal function, level PRN, LOT     Temp (24hrs), Avg:98.3 F (36.8 C), Min:98.3 F (36.8 C), Max:98.3 F (36.8 C)  Recent Labs  Lab 04/01/17 1446 04/01/17 1515 04/01/17 1722  WBC 10.2  --   --   CREATININE 1.70*  --   --   LATICACIDVEN  --  2.82* 1.49    CrCl cannot be calculated (Unknown ideal weight.).    Allergies  Allergen Reactions  . Oxycodone Nausea And Vomiting  . Bactrim [Sulfamethoxazole-Trimethoprim] Itching and Rash    Antimicrobials this admission: Vancomycin 3/7 >>  Zosyn 3/7 >>   Dose adjustments this admission: n/a  Microbiology results: none  Thank you for allowing pharmacy to be a part of this patient's care.  [name], PharmD, BCPS Clinical Pharmacist [phone] 04/01/2017 6:39 PM

## 2017-04-01 NOTE — H&P (Signed)
TRH H&P   Patient Demographics:    James Dougherty, is a 67 y.o. male  MRN: [id number]   DOB - [birth year]  Admit Date - 04/01/2017  Outpatient Primary MD for the patient is Rory Percy, MD  Referring MD/NP/PA: Langston Masker  Outpatient Specialists:    Deitra Mayo (vascular)  Patient coming from: home  Chief Complaint  Patient presents with  . Foot Injury      HPI:    James Dougherty  is a 67 y.o. male, w Psoriasis,  Dm2, Hypertension, Hyperlipidemia, CVA, PAD, apparently was seen in urgent care and sent to [facility] for admission for cellulitis a few weeks ago.  He was discharged last week Wednesday and continued to have black toe and redness and therefore presents to ED this evening.   In ED,  Xray Right foot IMPRESSION: No evidence of acute abnormality.  Wbc 10.2, Hgb 11.8, Plt 431 Na 134, K 5.5, Bun 28, Creatinine 1.70 Ast 20, Alt 19 Alk phos 71, T. Bili 0.8 Alb 3.1 Glucose 277  ED consulted Dr. Sharol Given who will be by to consult per ED.   Pt will be admitted for dry gangrene of the 1st toe , cellulitis   Review of systems:    In addition to the HPI above, No Fever-chills, No Headache, No changes with Vision or hearing, No problems swallowing food or Liquids, No Chest pain, Cough or Shortness of Breath, No Abdominal pain, No Nausea or Vommitting, Bowel movements are regular, No Blood in stool or Urine, No dysuria, No new skin rashes or bruises,   No new weakness, tingling, numbness in any extremity, No recent weight gain or loss, No polyuria, polydypsia or polyphagia, No significant Mental Stressors.  A full 10 point Review of Systems was done, except as stated above, all other Review of Systems were negative.   With Past History of the following :    Past Medical History:  Diagnosis Date  . Arthritis    "hands" (12/27/2012)  . High  cholesterol   . Hypertension   . Neuropathic pain   . PAD (peripheral artery disease) ([place])   . Psoriasis   . Stroke [facility]) 1999   "still have some speech problems at times; sometimes forget what I was going to say" (12/27/2012)  . Type II diabetes mellitus ([place])   . Ulcer    Left ankle/leg      Past Surgical History:  Procedure Laterality Date  . ABDOMINAL AORTAGRAM N/A 03/30/2012   Procedure: ABDOMINAL Maxcine Ham;  Surgeon: Serafina Mitchell, MD;  Location: [facility] CATH LAB;  Service: Cardiovascular;  Laterality: N/A;  . ABDOMINAL AORTAGRAM N/A 12/27/2012   Procedure: ABDOMINAL Maxcine Ham;  Surgeon: Serafina Mitchell, MD;  Location: [facility] CATH LAB;  Service: Cardiovascular;  Laterality: N/A;  . ANGIOPLASTY / STENTING FEMORAL Left 12/27/2012  . FEMORAL ARTERY STENT  03/30/2012  . LOWER EXTREMITY ANGIOGRAM  12/27/2012   Procedure: LOWER EXTREMITY ANGIOGRAM;  Surgeon: Serafina Mitchell, MD;  Location: [facility] CATH LAB;  Service: Cardiovascular;;  . POPLITEAL ARTERY STENT    . TONSILLECTOMY AND ADENOIDECTOMY        Social History:     Social History   Tobacco Use  . Smoking status: Former Smoker    Packs/day: 3.00    Years: 45.00    Pack years: 135.00    Types: Cigarettes    Last attempt to quit: 01/07/2011    Years since quitting: 6.2  . Smokeless tobacco: Never Used  Substance Use Topics  . Alcohol use: Yes    Comment: 12/27/2012 "couple times/yr I might have a drink"     Lives - at home  Mobility - walks by self   Family History :     Family History  Problem Relation Age of Onset  . Diabetes Sister        Bilateral amputation of lower legs  . Diabetes Sister   . Heart disease Sister 10       Heart disease before age 78  . Hypertension Sister   . Heart attack Sister   . Hyperlipidemia Sister   . Hypertension Father   . Hyperlipidemia Father   . Hyperlipidemia Mother   . Hypertension Mother   . Hypertension Daughter       Home Medications:   Prior to Admission medications     Medication Sig Start Date End Date Taking? Authorizing Provider  aspirin EC 81 MG tablet Take 81 mg by mouth daily.   Yes [provider]  clindamycin (CLEOCIN) 300 MG capsule Take 1 capsule by mouth every 6 (six) hours. 03/22/17  Yes [provider]  doxycycline (DORYX) 100 MG EC tablet Take 100 mg by mouth 2 (two) times daily.   Yes [provider]  fluticasone (FLONASE) 50 MCG/ACT nasal spray Place 2 sprays into both nostrils daily as needed for allergies or rhinitis.   Yes [provider]  gabapentin (NEURONTIN) 600 MG tablet Take 600 mg by mouth every evening.    Yes [provider]  lisinopril-hydrochlorothiazide (PRINZIDE,ZESTORETIC) 20-25 MG per tablet Take 1 tablet by mouth daily.   Yes [provider]  Podiatric Products (GOLD BOND FOOT) CREA Apply 1 application topically 4 (four) times daily.   Yes [provider]  simvastatin (ZOCOR) 20 MG tablet Take 20 mg by mouth at bedtime.    Yes [provider]  metFORMIN (GLUCOPHAGE) 500 MG tablet Take 500 mg by mouth 4 (four) times daily.     [provider]     Allergies:     Allergies  Allergen Reactions  . Oxycodone Nausea And Vomiting  . Glipizide     Bad headache and blurred vision  . Bactrim [Sulfamethoxazole-Trimethoprim] Itching and Rash     Physical Exam:   Vitals  Blood pressure (!) 155/63, pulse 76, temperature 98.3 F (36.8 C), temperature source Oral, resp. rate 18, weight 99.3 kg (218 lb 14.7 oz), SpO2 100 %.   1. General  lying in bed in NAD,    2. Normal affect and insight, Not Suicidal or Homicidal, Awake Alert, Oriented X 3.  3. No F.N deficits, ALL C.Nerves Intact, Strength 5/5 all 4 extremities, Sensation intact all 4 extremities, Plantars down going.  4. Ears and Eyes appear Normal, Conjunctivae clear, PERRLA. Moist Oral Mucosa.  5. Supple Neck, No JVD, No cervical lymphadenopathy appriciated, No Carotid Bruits.  6.  Symmetrical Chest  wall movement, Good air movement bilaterally, CTAB.  7. RRR, No Gallops, Rubs or Murmurs, No Parasternal Heave.  8. Positive Bowel Sounds, Abdomen Soft, No tenderness, No organomegaly appriciated,No rebound -guarding or rigidity.  9.  No Cyanosis, Normal Skin Turgor,  10. Good muscle tone,  joints appear normal , no effusions, Normal ROM.  11. No Palpable Lymph Nodes in Neck or Axillae Redness of the dorsum of the right foot about 1/3 of foot Distal foot black toe 1st toe right foot    Data Review:    CBC Recent Labs  Lab 04/01/17 1446  WBC 10.2  HGB 11.8*  HCT 36.3*  PLT 431*  MCV 98.1  MCH 31.9  MCHC 32.5  RDW 12.5  LYMPHSABS 2.5  MONOABS 0.6  EOSABS 0.2  BASOSABS 0.1   ------------------------------------------------------------------------------------------------------------------  Chemistries  Recent Labs  Lab 04/01/17 1446  NA 134*  K 5.5*  CL 100*  CO2 23  GLUCOSE 277*  BUN 28*  CREATININE 1.70*  CALCIUM 9.2  AST 20  ALT 19  ALKPHOS 71  BILITOT 0.8   ------------------------------------------------------------------------------------------------------------------ estimated creatinine clearance is 48.8 mL/min (A) (by C-G formula based on SCr of 1.7 mg/dL (H)). ------------------------------------------------------------------------------------------------------------------ No results for input(s): TSH, T4TOTAL, T3FREE, THYROIDAB in the last 72 hours.  Invalid input(s): FREET3  Coagulation profile No results for input(s): INR, PROTIME in the last 168 hours. ------------------------------------------------------------------------------------------------------------------- No results for input(s): DDIMER in the last 72 hours. -------------------------------------------------------------------------------------------------------------------  Cardiac Enzymes No results for input(s): CKMB, TROPONINI, MYOGLOBIN in the last 168  hours.  Invalid input(s): CK ------------------------------------------------------------------------------------------------------------------ No results found for: BNP   ---------------------------------------------------------------------------------------------------------------  Urinalysis No results found for: COLORURINE, APPEARANCEUR, LABSPEC, [place], GLUCOSEU, HGBUR, BILIRUBINUR, KETONESUR, PROTEINUR, UROBILINOGEN, NITRITE, LEUKOCYTESUR  ----------------------------------------------------------------------------------------------------------------   Imaging Results:    Dg Foot Complete Right  Result Date: 04/01/2017 CLINICAL DATA:  Acute RIGHT foot pain following injury last week. EXAM: RIGHT FOOT COMPLETE - 3+ VIEW COMPARISON:  None. FINDINGS: There is no evidence of acute fracture, subluxation or dislocation. No evidence of acute osteomyelitis. No soft tissue abnormalities are present. Vascular calcifications are present. IMPRESSION: No evidence of acute abnormality. Electronically Signed   By: Margarette Canada M.D.   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 NPO after [facility] consulted by ED, appreciate input  Cellulitis Blood culture x2 vanco iv , zosyn iv pharmacy to dose  Renal insufficiency Check urine sodium, urine creatinine, urine eosinophils Check renal ultrasound  Hyperkalemia Kayexalate 15gm po x1 Check cmp in am  Hypertension STOP Lisinopril/hydrochlorothiazide due to hyperkalemia, renal insufficiency Hydralazine 45m iv q6h prn sbp >160  PAD Cont aspirin 8110mpo qday Cont Simvastatin  DM2 STOP METFORMIN Due to Creatinine 1.7 fsbs q4h, ISS      DVT Prophylaxis Lovenox - SCDs  AM Labs Ordered, also please review Full  Orders  Family Communication: Admission, patients condition and plan of care including tests being ordered have been discussed with the patient  who indicate understanding and agree with the plan and Code Status  Code Status FULL CODE  Likely DC to home  Condition GUARDED    Consults called: Dr. DuSharol Givener ED  Admission status: inpatient  Time spent in minutes : [place]  Kim M.D on 04/01/2017 at 8:43 PM  Between 7am to 7pm - Pager - [phone]. After 7pm go to [email or web address] - password [email or web address]  [facility] - Office  [phone]

## 2017-04-01 NOTE — ED Notes (Signed)
MD Kim at bedside.

## 2017-04-01 NOTE — ED Triage Notes (Signed)
Pt sent over by his PCP because he was told that he has gangrene in his right foot. Pt had injury last week. Pt has hx of diabetes.

## 2017-04-01 NOTE — Progress Notes (Signed)
Patient ID: James SideJames F Dougherty, male   DOB: 1950/09/24, 67 y.o.   MRN: [id number] Request for evaluation and consult with Dr. Lajoyce Cornersuda relayed to Dr. Lajoyce Cornersuda. He indicate that he wiill see this patient on the early AM rounds.

## 2017-04-02 ENCOUNTER — Encounter (HOSPITAL_COMMUNITY): Payer: Medicare Other

## 2017-04-02 ENCOUNTER — Inpatient Hospital Stay (HOSPITAL_COMMUNITY): Payer: Medicare Other

## 2017-04-02 ENCOUNTER — Encounter (HOSPITAL_COMMUNITY): Payer: Self-pay

## 2017-04-02 ENCOUNTER — Other Ambulatory Visit: Payer: Self-pay

## 2017-04-02 DIAGNOSIS — I96 Gangrene, not elsewhere classified: Secondary | ICD-10-CM

## 2017-04-02 LAB — COMPREHENSIVE METABOLIC PANEL
ALT: 17 U/L (ref 17–63)
ANION GAP: 9 (ref 5–15)
AST: 17 U/L (ref 15–41)
Albumin: 2.9 g/dL — ABNORMAL LOW (ref 3.5–5.0)
Alkaline Phosphatase: 70 U/L (ref 38–126)
BUN: 21 mg/dL — ABNORMAL HIGH (ref 6–20)
CO2: 23 mmol/L (ref 22–32)
CREATININE: 1.41 mg/dL — AB (ref 0.61–1.24)
Calcium: 9.1 mg/dL (ref 8.9–10.3)
Chloride: 105 mmol/L (ref 101–111)
GFR, EST AFRICAN AMERICAN: 58 mL/min — AB (ref >60)
GFR, EST NON AFRICAN AMERICAN: 50 mL/min — AB (ref >60)
Glucose, Bld: 202 mg/dL — ABNORMAL HIGH (ref 65–99)
Potassium: 5.3 mmol/L — ABNORMAL HIGH (ref 3.5–5.1)
SODIUM: 137 mmol/L (ref 135–145)
Total Bilirubin: 1.1 mg/dL (ref 0.3–1.2)
Total Protein: 6.4 g/dL — ABNORMAL LOW (ref 6.5–8.1)

## 2017-04-02 LAB — SEDIMENTATION RATE: SED RATE: 70 mm/h — AB (ref 0–16)

## 2017-04-02 LAB — CBC
HCT: 36.7 % — ABNORMAL LOW (ref 39.0–52.0)
Hemoglobin: 11.8 g/dL — ABNORMAL LOW (ref 13.0–17.0)
MCH: 31.6 pg (ref 26.0–34.0)
MCHC: 32.2 g/dL (ref 30.0–36.0)
MCV: 98.1 fL (ref 78.0–100.0)
Platelets: 429 10*3/uL — ABNORMAL HIGH (ref 150–400)
RBC: 3.74 MIL/uL — ABNORMAL LOW (ref 4.22–5.81)
RDW: 12.6 % (ref 11.5–15.5)
WBC: 9 10*3/uL (ref 4.0–10.5)

## 2017-04-02 LAB — URINALYSIS, ROUTINE W REFLEX MICROSCOPIC
Bilirubin Urine: NEGATIVE
GLUCOSE, UA: 150 mg/dL — AB
Hgb urine dipstick: NEGATIVE
KETONES UR: NEGATIVE mg/dL
LEUKOCYTES UA: NEGATIVE
NITRITE: NEGATIVE
PROTEIN: NEGATIVE mg/dL
Specific Gravity, Urine: 1.005 (ref 1.005–1.030)
pH: 6 (ref 5.0–8.0)

## 2017-04-02 LAB — GLUCOSE, CAPILLARY
GLUCOSE-CAPILLARY: 176 mg/dL — AB (ref 65–99)
GLUCOSE-CAPILLARY: 200 mg/dL — AB (ref 65–99)
GLUCOSE-CAPILLARY: 188 mg/dL — AB (ref 65–99)
Glucose-Capillary: 190 mg/dL — ABNORMAL HIGH (ref 65–99)
Glucose-Capillary: 184 mg/dL — ABNORMAL HIGH (ref 65–99)

## 2017-04-02 LAB — SODIUM, URINE, RANDOM: SODIUM UR: 72 mmol/L

## 2017-04-02 MED ORDER — AMLODIPINE BESYLATE 5 MG PO TABS
5.0000 mg | ORAL_TABLET | Freq: Every day | ORAL | Status: DC
  Administered 2017-04-02 – 2017-04-06 (×5): 5 mg via ORAL
  Filled 2017-04-02 (×5): qty 1

## 2017-04-02 MED ORDER — PIPERACILLIN-TAZOBACTAM 3.375 G IVPB
3.3750 g | Freq: Three times a day (TID) | INTRAVENOUS | Status: DC
  Administered 2017-04-03 – 2017-04-06 (×9): 3.375 g via INTRAVENOUS
  Filled 2017-04-02 (×13): qty 50

## 2017-04-02 NOTE — Progress Notes (Signed)
PROGRESS NOTE  James Dougherty  WUJ:[id number] DOB: [birth year] DOA: 04/01/2017 PCP: Selinda Flavin, MD  Brief Narrative:   The patient is a 67 year old male with history of psoriasis, diabetes mellitus type 2, hypertension, hyperlipidemia, strokes, peripheral arterial disease who was recently diagnosed with a right lower extremity cellulitis and started on antibiotics.  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  He was started on antibiotics.  Dr. Lajoyce Corners was consulted who recommended the patient be seen by vascular surgery.  Vascular surgery has evaluated the patient and has arranged for an angiogram to be performed on Monday.    Assessment & Plan:  Gangrene and cellulitis of the 1st toe right foot -  Appreciate orthopedic and vascular surgery assistance - Blood culture x2 ending - Continue Vanc, zosyn  Chronic kidney disease stage III, patient's creatinine is near baseline of 1.4-1.7 -Minimize nephrotoxins and renally dose medications  Hyperkalemia improving after Kayexalate  Hypertension - STOP Lisinopril due to hyperkalemia - d/c HCTZ due to renal insufficiency - Start norvasc -  Continue Hydralazine 10mg  iv q6h prn sbp >160  PAD Cont aspirin 81mg  po qday Cont Simvastatin  DM2 STOP METFORMIN Due to Creatinine 1.7 fsbs q4h, ISS   DVT prophylaxis: Lovenox Code Status: Full code Family Communication: Patient alone Disposition Plan: Patient to undergo an arteriogram on Monday to determine if there are any areas of the right lower extremity where revascularization may preserve his toe   Consultants:   Dr. Lajoyce Corners, orthopedic surgery  Dr. early, vascular surgery  Procedures:  None  Antimicrobials:  Anti-infectives (From admission, onward)   Start     Dose/Rate Route Frequency  Ordered Stop   04/01/17 2330  piperacillin-tazobactam (ZOSYN) IVPB 3.375 g     3.375 g 12.5 mL/hr over 240 Minutes Intravenous Every 8 hours 04/01/17 1843     04/01/17 1930  vancomycin (VANCOCIN) 1,500 mg in sodium chloride 0.9 % 500 mL IVPB     1,500 mg 250 mL/hr over 120 Minutes Intravenous Every 24 hours 04/01/17 1839     04/01/17 1815  piperacillin-tazobactam (ZOSYN) IVPB 3.375 g     3.375 g 100 mL/hr over 30 Minutes Intravenous  Once 04/01/17 1812 04/01/17 1858       Subjective: Ongoing intermittent bee sting sensation of the first toe of the right foot.  He has not noticed any change to the erythema or the blackness of his toe since admission.  Denies fevers, chills since admission.  He had a fever of 105 Fahrenheit a few days prior to admission.  Objective: Vitals:   04/01/17 1930 04/01/17 2230 04/02/17 0630 04/02/17 1300  BP: (!) 155/63 (!) 143/79 (!) 135/34 (!) 151/37  Pulse: 76 79 74 88  Resp: 18   18  Temp:  (!) 97.5 F (36.4 C) 98.2 F (36.8 C) 97.7 F (36.5 C)  TempSrc:  Oral Oral Oral  SpO2: 100% 100% 99% 99%  Weight:        Intake/Output Summary (Last 24 hours) at 04/02/2017 1554 Last data filed at 04/02/2017 1300 Gross per 24 hour  Intake 410 ml  Output 500 ml  Net -90 ml   Filed Weights   04/01/17 1815  Weight: 99.3 kg (218 lb 14.7 oz)    Examination:  General exam:  Adult  male.  No acute distress.  HEENT:  NCAT, MMM Respiratory system: Clear to auscultation bilaterally Cardiovascular system: Regular rate and rhythm, normal S1/S2. No murmurs, rubs, gallops or clicks.  Warm extremities Gastrointestinal system: Normal active bowel sounds, soft, nondistended, nontender. MSK:  Normal tone and bulk, absent pedal pulses bilaterally.  Right lower extremity the first toe down to about the mid metatarsal/first ray.  TTP despite neuropathy. Neuro:  Grossly intact    Data Reviewed: I have personally reviewed following labs and imaging studies  CBC: Recent  Labs  Lab 04/01/17 1446 04/02/17 0559  WBC 10.2 9.0  NEUTROABS 6.8  --   HGB 11.8* 11.8*  HCT 36.3* 36.7*  MCV 98.1 98.1  PLT 431* 429*   Basic Metabolic Panel: Recent Labs  Lab 04/01/17 1446 04/02/17 0559  NA 134* 137  K 5.5* 5.3*  CL 100* 105  CO2 23 23  GLUCOSE 277* 202*  BUN 28* 21*  CREATININE 1.70* 1.41*  CALCIUM 9.2 9.1   GFR: Estimated Creatinine Clearance: 58.9 mL/min (A) (by C-G formula based on SCr of 1.41 mg/dL (H)). Liver Function Tests: Recent Labs  Lab 04/01/17 1446 04/02/17 0559  AST 20 17  ALT 19 17  ALKPHOS 71 70  BILITOT 0.8 1.1  PROT 7.0 6.4*  ALBUMIN 3.1* 2.9*   No results for input(s): LIPASE, AMYLASE in the last 168 hours. No results for input(s): AMMONIA in the last 168 hours. Coagulation Profile: No results for input(s): INR, PROTIME in the last 168 hours. Cardiac Enzymes: No results for input(s): CKTOTAL, CKMB, CKMBINDEX, TROPONINI in the last 168 hours. BNP (last 3 results) No results for input(s): PROBNP in the last 8760 hours. HbA1C: No results for input(s): HGBA1C in the last 72 hours. CBG: Recent Labs  Lab 04/01/17 2254 04/02/17 0625 04/02/17 1200 04/02/17 1550  GLUCAP 190* 176* 200* 184*   Lipid Profile: No results for input(s): CHOL, HDL, LDLCALC, TRIG, CHOLHDL, LDLDIRECT in the last 72 hours. Thyroid Function Tests: No results for input(s): TSH, T4TOTAL, FREET4, T3FREE, THYROIDAB in the last 72 hours. Anemia Panel: No results for input(s): VITAMINB12, FOLATE, FERRITIN, TIBC, IRON, RETICCTPCT in the last 72 hours. Urine analysis:    Component Value Date/Time   COLORURINE STRAW (A) 04/02/2017 0218   APPEARANCEUR CLEAR 04/02/2017 0218   LABSPEC 1.005 04/02/2017 0218   PHURINE 6.0 04/02/2017 0218   GLUCOSEU 150 (A) 04/02/2017 0218   HGBUR NEGATIVE 04/02/2017 0218   BILIRUBINUR NEGATIVE 04/02/2017 0218   KETONESUR NEGATIVE 04/02/2017 0218   PROTEINUR NEGATIVE 04/02/2017 0218   NITRITE NEGATIVE 04/02/2017 0218    LEUKOCYTESUR NEGATIVE 04/02/2017 0218   Sepsis Labs: @LABRCNTIP (procalcitonin:4,lacticidven:4)  ) Recent Results (from the past 240 hour(s))  Culture, blood (routine x 2)     Status: None (Preliminary result)   Collection Time: 04/01/17 11:04 PM  Result Value Ref Range Status   Specimen Description BLOOD LEFT HAND  Final   Special Requests   Final    AEROBIC BOTTLE ONLY Blood Culture adequate volume Performed at [facility], [address], [place], [place] [address]    Culture PENDING  Incomplete   Report Status PENDING  Incomplete      Radiology Studies: US Renal  Result Date: 04/02/2017 CLINICAL DATA:  Renal insufficiency EXAM: RENAL / URINARY TRACT ULTRASOUND COMPLETE COMPARISON:  None. FINDINGS: Right Kidney: Length: 10.3 cm. Echogenicity within normal limits. No mass or hydronephrosis visualized. Left Kidney: Length: 11.6 cm. 2 cm left upper pole cyst. Echogenicity within normal  limits. No mass or hydronephrosis visualized. Bladder: Appears normal for degree of bladder distention. Cholelithiasis IMPRESSION: No significant renal abnormality. Cholelithiasis Electronically Signed   By: Marlan Palauharles  Clark M.D.   On: 04/02/2017 07:36   Dg Foot Complete Right  Result Date: 04/01/2017 CLINICAL DATA:  Acute RIGHT foot pain following injury last week. EXAM: RIGHT FOOT COMPLETE - 3+ VIEW COMPARISON:  None. FINDINGS: There is no evidence of acute fracture, subluxation or dislocation. No evidence of acute osteomyelitis. No soft tissue abnormalities are present. Vascular calcifications are present. IMPRESSION: No evidence of acute abnormality. Electronically Signed   By: Harmon PierJeffrey  Hu M.D.   On: 04/01/2017 15:16     Scheduled Meds: . aspirin EC  81 mg Oral Daily  . enoxaparin (LOVENOX) injection  30 mg Subcutaneous Q24H  . gabapentin  600 mg Oral QPM  . insulin aspart  0-9 Units Subcutaneous Q4H  . simvastatin  20 mg Oral QHS   Continuous Infusions: . piperacillin-tazobactam (ZOSYN)   IV 3.375 g (04/02/17 1457)  . vancomycin Stopped (04/01/17 2203)     LOS: 1 day    Time spent: 30 min    Renae FickleMackenzie Sherman Lipuma, MD [facility] Pager [phone]  If 7PM-7AM, please contact night-coverage [email or web address] Password [email or web address] 04/02/2017, 3:54 PM

## 2017-04-02 NOTE — H&P (View-Only) (Signed)
[facility] CONSULT [place]   MRN : [id number]  Reason for Consult: Dry gangrene right GT Referring Physician: Dr. Lajoyce Cornersuda  History of Present Illness: 67 y/o male known to our office.  He was carrying a case of water 2 weeks ago and dropped it on his right great toe.  The open wound progressed to gangrene and cellulitis with a Tm of 105 this past Monday.  [facility] and was admitted for 2 days receiving IV antibiotics and then released.  He states he has developed calf pain bilaterally that started about a year ago.  He can ambulated about 50 yards before he has to stop and rest.  He is status post left superficial femoral and popliteal artery recanalization and subsequent stenting on 03/30/2012, followed by angioplasty of the stent 12/27/2012 for stenosis.  He is followed yearly and was last seen by our NP Rosalita ChessmanSuzanne Nickel on 12/09/2016.  At the time his ABI's were indicating sever disease of bilateral LE.    ABI (Date: 12/09/2016):  R:  ? ABI: 0.45 (was 0.63 on 11-25-15),  ? PT: mono ? DP: mono ? TBI:  0.11 (was 0.26)  L:  ? ABI: 0.70 (was 0.61),  ? PT: mono ? DP: mono ? TBI: 0.14 (was 0.38) Decline in bilateral ABI and TBI.    He denided new or increasing symptoms of  Claudication since his previous visits.  He has been using a stationary bike almost daily for an hour, feels like his legs have improved. After walking 60-70 yards his right thigh feels like it's burning, relieved by rest. He states his left leg rarely bothers him. He denies non healing wounds.    Past medical history includes: DM, Stroke with stable ICA stenosis,  PAD, and hypercholesterolemia.       Current Facility-Administered Medications  Medication Dose Route Frequency Provider Last Rate Last Dose  . acetaminophen (TYLENOL) tablet 650 mg  650 mg Oral Q6H PRN Pearson GrippeKim, Dravon, MD       Or  . acetaminophen (TYLENOL) suppository 650 mg  650 mg Rectal Q6H PRN Pearson GrippeKim, Hyatt, MD       . aspirin EC tablet 81 mg  81 mg Oral Daily Pearson GrippeKim, Chayse, MD      . enoxaparin (LOVENOX) injection 30 mg  30 mg Subcutaneous Q24H Pearson GrippeKim, Mahkai, MD      . gabapentin (NEURONTIN) tablet 600 mg  600 mg Oral QPM Pearson GrippeKim, Eliud, MD      . hydrALAZINE (APRESOLINE) injection 10 mg  10 mg Intravenous Q6H PRN Pearson GrippeKim, Omarian, MD      . insulin aspart (novoLOG) injection 0-9 Units  0-9 Units Subcutaneous Q4H Pearson GrippeKim, Akeel, MD      . piperacillin-tazobactam (ZOSYN) IVPB 3.375 g  3.375 g Intravenous Q8H Bajbus, Lauren D, RPH 12.5 mL/hr at 04/02/17 0709 3.375 g at 04/02/17 0709  . simvastatin (ZOCOR) tablet 20 mg  20 mg Oral QHS Pearson GrippeKim, Moise, MD      . vancomycin [facility](VANCOCIN) 1,500 mg in sodium chloride 0.9 % 500 mL IVPB  1,500 mg Intravenous Q24H Bajbus, Lauren D, RPH   Stopped at 04/01/17 2203    Pt meds include: Statin :Yes Betablocker: No ASA: Yes Other anticoagulants/antiplatelets: none  Past Medical History:  Diagnosis Date  . Arthritis    "hands" (12/27/2012)  . High cholesterol   . Hypertension   . Neuropathic pain   . PAD (peripheral artery disease) (HCC)   . Psoriasis   .  Stroke [facility]) 1999   "still have some speech problems at times; sometimes forget what I was going to say" (12/27/2012)  . Type II diabetes mellitus (HCC)   . Ulcer    Left ankle/leg    Past Surgical History:  Procedure Laterality Date  . ABDOMINAL AORTAGRAM N/A 03/30/2012   Procedure: ABDOMINAL Ronny Flurry;  Surgeon: Nada Libman, MD;  Location: [facility] CATH LAB;  Service: Cardiovascular;  Laterality: N/A;  . ABDOMINAL AORTAGRAM N/A 12/27/2012   Procedure: ABDOMINAL Ronny Flurry;  Surgeon: Nada Libman, MD;  Location: [facility] CATH LAB;  Service: Cardiovascular;  Laterality: N/A;  . ANGIOPLASTY / STENTING FEMORAL Left 12/27/2012  . FEMORAL ARTERY STENT  03/30/2012  . LOWER EXTREMITY ANGIOGRAM  12/27/2012   Procedure: LOWER EXTREMITY ANGIOGRAM;  Surgeon: Nada Libman, MD;  Location: [facility] CATH LAB;  Service: Cardiovascular;;  . POPLITEAL ARTERY  STENT    . TONSILLECTOMY AND ADENOIDECTOMY      Social History Social History   Tobacco Use  . Smoking status: Former Smoker    Packs/day: 3.00    Years: 45.00    Pack years: 135.00    Types: Cigarettes    Last attempt to quit: 01/07/2011    Years since quitting: 6.2  . Smokeless tobacco: Never Used  Substance Use Topics  . Alcohol use: Yes    Comment: 12/27/2012 "couple times/yr I might have a drink"  . Drug use: No    Family History Family History  Problem Relation Age of Onset  . Diabetes Sister        Bilateral amputation of lower legs  . Diabetes Sister   . Heart disease Sister 75       Heart disease before age 44  . Hypertension Sister   . Heart attack Sister   . Hyperlipidemia Sister   . Hypertension Father   . Hyperlipidemia Father   . Hyperlipidemia Mother   . Hypertension Mother   . Hypertension Daughter     Allergies  Allergen Reactions  . Oxycodone Nausea And Vomiting  . Glipizide     Bad headache and blurred vision  . Bactrim [Sulfamethoxazole-Trimethoprim] Itching and Rash     REVIEW OF SYSTEMS  General: [ ]  Weight loss, [ ]  Fever, [ ]  chills [x]  hard of hearing Neurologic: [ ]  Dizziness, [ ]  Blackouts, [ ]  Seizure [x ] Stroke, [ ]  "Mini stroke", [ ]  Slurred speech, [ ]  Temporary blindness; [ ]  weakness in arms or legs, [ ]  Hoarseness [ ]  Dysphagia Cardiac: [ ]  Chest pain/pressure, [ ]  Shortness of breath at rest [ ]  Shortness of breath with exertion, [ ]  Atrial fibrillation or irregular heartbeat  Vascular: [x ] Pain in legs with walking, [ ]  Pain in legs at rest, [ ]  Pain in legs at night,  [x ] Non-healing ulcer, [ ]  Blood clot in vein/DVT,   Pulmonary: [ ]  Home oxygen, [ ]  Productive cough, [ ]  Coughing up blood, [ ]  Asthma,  [ ]  Wheezing [ ]  COPD Musculoskeletal:  [ ]  Arthritis, [ ]  Low back pain, [ ]  Joint pain Hematologic: [ ]  Easy Bruising, [ ]  Anemia; [ ]  Hepatitis Gastrointestinal: [ ]  Blood in stool, [ ]  Gastroesophageal  Reflux/heartburn, Urinary: [ ]  chronic Kidney disease, [ ]  on HD - [ ]  MWF or [ ]  TTHS, [ ]  Burning with urination, [ ]  Difficulty urinating Skin: [ ]  Rashes, [x ] Wounds Psychological: [ ]  Anxiety, [ ]  Depression  Physical Examination Vitals:   04/01/17  1900 04/01/17 1930 04/01/17 2230 04/02/17 0630  BP: (!) 162/69 (!) 155/63 (!) 143/79 (!) 135/34  Pulse: 83 76 79 74  Resp: 19 18    Temp:   (!) 97.5 F (36.4 C) 98.2 F (36.8 C)  TempSrc:   Oral Oral  SpO2: 98% 100% 100% 99%  Weight:       Body mass index is 33.29 kg/m.  General:  WDWN in NAD HENT: WNL Eyes: Pupils equal Pulmonary: normal non-labored breathing , without Rales, rhonchi,  wheezing Cardiac: RRR, without  Murmurs, rubs or gallops;  carotid bruits Abdomen: soft, NT, no masses Skin: no rashes, ulcers noted;   right foot dorsum cellulitis GT gangrene cellulitis; no open wounds;         Vascular Exam/Pulses: radial, brachial pulses, weak femoral pulses B, non palpable pedal pulses.  Doppler left PT/DP, Right PT weak DP monophasic   Musculoskeletal: no muscle wasting or atrophy; bilateral LE edema  Neurologic: A&O X 3; Appropriate Affect ;  SENSATION: normal; MOTOR FUNCTION: 5/5 Symmetric Speech is fluent/normal   Significant Diagnostic Studies: CBC Lab Results  Component Value Date   WBC 9.0 04/02/2017   HGB 11.8 (L) 04/02/2017   HCT 36.7 (L) 04/02/2017   MCV 98.1 04/02/2017   PLT 429 (H) 04/02/2017    BMET    Component Value Date/Time   NA 137 04/02/2017 0559   K 5.3 (H) 04/02/2017 0559   CL 105 04/02/2017 0559   CO2 23 04/02/2017 0559   GLUCOSE 202 (H) 04/02/2017 0559   BUN 21 (H) 04/02/2017 0559   CREATININE 1.41 (H) 04/02/2017 0559   CALCIUM 9.1 04/02/2017 0559   GFRNONAA 50 (L) 04/02/2017 0559   GFRAA 58 (L) 04/02/2017 0559   Estimated Creatinine Clearance: 58.9 mL/min (A) (by C-G formula based on SCr of 1.41 mg/dL (H)).  COAG No results found for: INR,  PROTIME     ASSESSMENT/PLAN:  He has known PAD now with nonhealing right GT gangrene  Recent history of bilateral LE claudication.  We are recommending arteriogram with bilateral LE runoff and possible intervention.  He will need a right great toe amputation or more pending his vasculature based on the angiogram.    Angiogram scheduled for Monday 04/05/2017 with Dr. Randie Heinz.   Mosetta Pigeon 04/02/2017 11:08 AM  I have examined the patient, reviewed and agree with above.  Gust with the patient and his daughter present and also with Dr. Lajoyce Corners.  Prior history of left leg intervention with Dr. Myra Gianotti.  Does not have adequate flow to heal amputation of the dry gangrene of his entire right great toe.  Will plan arteriography and possible intervention on Monday, 04/05/2017  Gretta Began, MD 04/02/2017 12:45 PM

## 2017-04-02 NOTE — ED Provider Notes (Signed)
[facility] Provider Note   CSN: [id number] Arrival date & time: 04/01/17  1424     History   Chief Complaint Chief Complaint  Patient presents with  . Foot Injury    HPI James Dougherty is a 67 y.o. male.  HPI  Patient is a 67 year old male with a history of type 2 diabetes mellitus, peripheral vascular disease, CVA with left-sided weakness, occurred in 1999, presenting for right hallux injury.  Patient reports that he was getting something out of his truck 2 weeks ago when he stubbed his toe.  Subsequently, he developed rapid erythema of the foot and spreading up the right shin.  Patient was seen in urgent care and given an IM antibiotic as well as p.o. antibiotics.  Patient reports that interim his fever resolved, he had resolution of most of the redness admission, but he has had a progressively worsening blackness around his right toe.  Patient reports he went to the wound care center at [facility] walking him today, and they told him he needed to come the emergency department because there is nothing they could do for the toe.  Patient denies any current fevers, chills, chest pain, shortness of breath, nausea, vomiting or abdominal pain.  Patient denies any history of MI.  Patient denies any history of ischemic cardiac workup.  Past Medical History:  Diagnosis Date  . Arthritis    "hands" (12/27/2012)  . High cholesterol   . Hypertension   . Neuropathic pain   . PAD (peripheral artery disease) (HCC)   . Psoriasis   . Stroke [facility]) 1999   "still have some speech problems at times; sometimes forget what I was going to say" (12/27/2012)  . Type II diabetes mellitus (HCC)   . Ulcer    Left ankle/leg    Patient Active Problem List   Diagnosis Date Noted  . Cellulitis 04/01/2017  . Gangrene of toe (HCC) 04/01/2017  . Anemia 04/01/2017  . Hyperkalemia 04/01/2017  . Renal insufficiency 04/01/2017  . Type II diabetes mellitus with complication (HCC)  04/01/2017  . Dry gangrene (HCC) 04/01/2017  . Groin pain 07/04/2014  . Peripheral vascular disease, unspecified (HCC) 06/27/2013  . PAD (peripheral artery disease) (HCC) 12/27/2012  . PVD (peripheral vascular disease) (HCC) 12/12/2012  . Atherosclerosis of native arteries of the extremities with ulceration(440.23) 03/21/2012  . Pain in limb 03/21/2012    Past Surgical History:  Procedure Laterality Date  . ABDOMINAL AORTAGRAM N/A 03/30/2012   Procedure: ABDOMINAL Ronny Flurry;  Surgeon: Nada Libman, MD;  Location: [facility] CATH LAB;  Service: Cardiovascular;  Laterality: N/A;  . ABDOMINAL AORTAGRAM N/A 12/27/2012   Procedure: ABDOMINAL Ronny Flurry;  Surgeon: Nada Libman, MD;  Location: [facility] CATH LAB;  Service: Cardiovascular;  Laterality: N/A;  . ANGIOPLASTY / STENTING FEMORAL Left 12/27/2012  . FEMORAL ARTERY STENT  03/30/2012  . LOWER EXTREMITY ANGIOGRAM  12/27/2012   Procedure: LOWER EXTREMITY ANGIOGRAM;  Surgeon: Nada Libman, MD;  Location: [facility] CATH LAB;  Service: Cardiovascular;;  . POPLITEAL ARTERY STENT    . TONSILLECTOMY AND ADENOIDECTOMY         Home Medications    Prior to Admission medications   Medication Sig Start Date End Date Taking? Authorizing Provider  aspirin EC 81 MG tablet Take 81 mg by mouth daily.   Yes [provider]  clindamycin (CLEOCIN) 300 MG capsule Take 1 capsule by mouth every 6 (six) hours. 03/22/17  Yes [provider]  doxycycline (DORYX) 100 MG EC tablet Take 100 mg by mouth 2 (two) times daily.   Yes [provider]  fluticasone (FLONASE) 50 MCG/ACT nasal spray Place 2 sprays into both nostrils daily as needed for allergies or rhinitis.   Yes [provider]  gabapentin (NEURONTIN) 600 MG tablet Take 600 mg by mouth every evening.    Yes [provider]  lisinopril-hydrochlorothiazide (PRINZIDE,ZESTORETIC) 20-25 MG per tablet Take 1 tablet by mouth daily.   Yes [provider]  Podiatric Products  (GOLD BOND FOOT) CREA Apply 1 application topically 4 (four) times daily.   Yes [provider]  simvastatin (ZOCOR) 20 MG tablet Take 20 mg by mouth at bedtime.    Yes [provider]  metFORMIN (GLUCOPHAGE) 500 MG tablet Take 500 mg by mouth 4 (four) times daily.     [provider]    Family History Family History  Problem Relation Age of Onset  . Diabetes Sister        Bilateral amputation of lower legs  . Diabetes Sister   . Heart disease Sister 6157       Heart disease before age 67  . Hypertension Sister   . Heart attack Sister   . Hyperlipidemia Sister   . Hypertension Father   . Hyperlipidemia Father   . Hyperlipidemia Mother   . Hypertension Mother   . Hypertension Daughter     Social History Social History   Tobacco Use  . Smoking status: Former Smoker    Packs/day: 3.00    Years: 45.00    Pack years: 135.00    Types: Cigarettes    Last attempt to quit: 01/07/2011    Years since quitting: 6.2  . Smokeless tobacco: Never Used  Substance Use Topics  . Alcohol use: Yes    Comment: 12/27/2012 "couple times/yr I might have a drink"  . Drug use: No     Allergies   Oxycodone; Glipizide; and Bactrim [sulfamethoxazole-trimethoprim]   Review of Systems Review of Systems Constitutional: Negative for chills and fever.  HENT: Negative for congestion and rhinorrhea.   Respiratory: Negative for chest tightness and shortness of breath.   Cardiovascular: Negative for chest pain.  Gastrointestinal: Negative for abdominal pain, nausea and vomiting.  Skin: Positive for color change and wound.  Neurological: Positive for numbness.  All other systems reviewed and are negative.  Physical Exam Updated Vital Signs BP (!) 143/79   Pulse 79   Temp (!) 97.5 F (36.4 C) (Oral)   Resp 18   Wt 99.3 kg (218 lb 14.7 oz)   SpO2 100%   BMI 33.29 kg/m   Physical Exam Constitutional: He appears well-developed and well-nourished. No distress.  HENT:   Head: Normocephalic and atraumatic.  Mouth/Throat: Oropharynx is clear and moist.  Eyes: Conjunctivae and EOM are normal. Pupils are equal, round, and reactive to light.  Neck: Normal range of motion. Neck supple.  Cardiovascular: Normal rate, regular rhythm, S1 normal and S2 normal.  No murmur heard. Pulmonary/Chest: Effort normal and breath sounds normal. He has no wheezes. He has no rales.  Abdominal: Soft. He exhibits no distension. There is no tenderness. There is no guarding.  Musculoskeletal: Normal range of motion. He exhibits no edema or deformity.  Necrosis of the right hallux with erythema extending over the dorsum of the foot.  Erythema does not extend up the right shin.  Sensation intact and distal phalanges of the right foot with exception of right helix.  Lymphadenopathy:    He has no cervical adenopathy.  Neurological: He is alert.  Cranial nerves grossly intact. Patient moves extremities symmetrically and with good coordination.  Skin: Skin is warm and dry. Rash noted. There is erythema.  Extensive scaly lesions of bilateral legs, arms, and scalp.  (Patient has a history of psoriasis)  Psychiatric: He has a normal mood and affect. His behavior is normal. Judgment and thought content normal.  Nursing note and vitals reviewed.        ED Treatments / Results  Labs (all labs ordered are listed, but only abnormal results are displayed) Labs Reviewed  COMPREHENSIVE METABOLIC PANEL - Abnormal; Notable for the following components:      Result Value   Sodium 134 (*)    Potassium 5.5 (*)    Chloride 100 (*)    Glucose, Bld 277 (*)    BUN 28 (*)    Creatinine, Ser 1.70 (*)    Albumin 3.1 (*)    GFR calc non Af Amer 40 (*)    GFR calc Af Amer 47 (*)    All other components within normal limits  CBC WITH DIFFERENTIAL/PLATELET - Abnormal; Notable for the following components:   RBC 3.70 (*)    Hemoglobin 11.8 (*)    HCT 36.3 (*)    Platelets 431 (*)    All other  components within normal limits  I-STAT CG4 LACTIC ACID, ED - Abnormal; Notable for the following components:   Lactic Acid, Venous 2.82 (*)    All other components within normal limits  CULTURE, BLOOD (ROUTINE X 2)  CULTURE, BLOOD (ROUTINE X 2)  SODIUM, URINE, RANDOM  CALCIUM / CREATININE RATIO, URINE  URINALYSIS, ROUTINE W REFLEX MICROSCOPIC  SEDIMENTATION RATE  CBC  COMPREHENSIVE METABOLIC PANEL  I-STAT CG4 LACTIC ACID, ED  CYTOLOGY - NON PAP    EKG  EKG Interpretation None       Radiology Dg Foot Complete Right  Result Date: 04/01/2017 CLINICAL DATA:  Acute RIGHT foot pain following injury last week. EXAM: RIGHT FOOT COMPLETE - 3+ VIEW COMPARISON:  None. FINDINGS: There is no evidence of acute fracture, subluxation or dislocation. No evidence of acute osteomyelitis. No soft tissue abnormalities are present. Vascular calcifications are present. IMPRESSION: No evidence of acute abnormality. Electronically Signed   By: Harmon Pier M.D.   On: 04/01/2017 15:16    Procedures Procedures (including critical care time)  Medications Ordered in ED Medications  vancomycin (VANCOCIN) 1,500 mg in sodium chloride 0.9 % 500 mL IVPB (0 mg Intravenous Stopped 04/01/17 2203)  piperacillin-tazobactam (ZOSYN) IVPB 3.375 g (not administered)  aspirin EC tablet 81 mg (not administered)  gabapentin (NEURONTIN) tablet 600 mg (not administered)  simvastatin (ZOCOR) tablet 20 mg (not administered)  hydrALAZINE (APRESOLINE) injection 10 mg (not administered)  enoxaparin (LOVENOX) injection 30 mg (not administered)  sodium polystyrene (KAYEXALATE) 15 GM/60ML suspension 15 g (not administered)  0.9 %  sodium chloride infusion (not administered)  acetaminophen (TYLENOL) tablet 650 mg (not administered)    Or  acetaminophen (TYLENOL) suppository 650 mg (not administered)  insulin aspart (novoLOG) injection 0-9 Units (not administered)  piperacillin-tazobactam (ZOSYN) IVPB 3.375 g (0 g Intravenous  Stopped 04/01/17 1858)  Tdap (BOOSTRIX) injection 0.5 mL (0.5 mLs Intramuscular Given 04/01/17 1830)  sodium chloride 0.9 % bolus 1,000 mL (1,000 mLs Intravenous Transfusing/Transfer 04/01/17 1909)     Initial Impression / Assessment and Plan / ED Course  I have reviewed the triage vital signs and the nursing  notes.  Pertinent labs & imaging results that were available during my care of the patient were reviewed by me and considered in my medical decision making (see chart for details).     Patient is nontoxic-appearing, afebrile, and in no acute distress.  Patient exhibits what appears to be acute gangrene of the right hallux.  It appears that patient had interim improvement of cellulitis based on his report of a febrile illness after the onset of his right foot injury.  Does not appear to be underlying fracture or obvious signs of osteomyelitis on x-ray.  Will admit to medicine and start vancomycin and Zosyn covering for osteomyelitis.  Patient is in understanding and agrees with plan of care.  Spoke with Dr. Otelia Sergeant of [facility], regarding possible consultation with Dr. Lajoyce Corners for gangrenous toe. Patient case to be relayed to Dr. Lajoyce Corners.  1930.  Spoke with Dr. Selena Batten of [facility], who will admit the patient.  Final Clinical Impressions(s) / ED Diagnoses   Final diagnoses:  Gangrene of foot [facility])    ED Discharge Orders    None       Delia Chimes 04/02/17 0232    Abelino Derrick, MD 04/06/17 1620

## 2017-04-02 NOTE — Consult Note (Signed)
ORTHOPAEDIC CONSULTATION  REQUESTING PHYSICIAN: Renae Fickle, MD  Chief Complaint: Dry gangrene and painful right great toe  HPI: James Dougherty is a 67 y.o. male who presents with diabetic insensate neuropathy peripheral vascular disease and a dry gangrenous right great toe.  Patient has had vascular studies several years ago.  Ankle-brachial indices were around 60% at that time.  Patient had stable claudication.  Past Medical History:  Diagnosis Date  . Arthritis    "hands" (12/27/2012)  . High cholesterol   . Hypertension   . Neuropathic pain   . PAD (peripheral artery disease) (HCC)   . Psoriasis   . Stroke [facility]) 1999   "still have some speech problems at times; sometimes forget what I was going to say" (12/27/2012)  . Type II diabetes mellitus (HCC)   . Ulcer    Left ankle/leg   Past Surgical History:  Procedure Laterality Date  . ABDOMINAL AORTAGRAM N/A 03/30/2012   Procedure: ABDOMINAL Ronny Flurry;  Surgeon: Nada Libman, MD;  Location: [facility] CATH LAB;  Service: Cardiovascular;  Laterality: N/A;  . ABDOMINAL AORTAGRAM N/A 12/27/2012   Procedure: ABDOMINAL Ronny Flurry;  Surgeon: Nada Libman, MD;  Location: [facility] CATH LAB;  Service: Cardiovascular;  Laterality: N/A;  . ANGIOPLASTY / STENTING FEMORAL Left 12/27/2012  . FEMORAL ARTERY STENT  03/30/2012  . LOWER EXTREMITY ANGIOGRAM  12/27/2012   Procedure: LOWER EXTREMITY ANGIOGRAM;  Surgeon: Nada Libman, MD;  Location: [facility] CATH LAB;  Service: Cardiovascular;;  . POPLITEAL ARTERY STENT    . TONSILLECTOMY AND ADENOIDECTOMY     Social History   Socioeconomic History  . Marital status: Single    Spouse name: None  . Number of children: None  . Years of education: None  . Highest education level: None  Social Needs  . Financial resource strain: None  . Food insecurity - worry: None  . Food insecurity - inability: None  . Transportation needs - medical: None  . Transportation needs - non-medical: None  Occupational  History  . None  Tobacco Use  . Smoking status: Former Smoker    Packs/day: 3.00    Years: 45.00    Pack years: 135.00    Types: Cigarettes    Last attempt to quit: 01/07/2011    Years since quitting: 6.2  . Smokeless tobacco: Never Used  Substance and Sexual Activity  . Alcohol use: Yes    Comment: 12/27/2012 "couple times/yr I might have a drink"  . Drug use: No  . Sexual activity: No  Other Topics Concern  . None  Social History Narrative  . None   Family History  Problem Relation Age of Onset  . Diabetes Sister        Bilateral amputation of lower legs  . Diabetes Sister   . Heart disease Sister 16       Heart disease before age 39  . Hypertension Sister   . Heart attack Sister   . Hyperlipidemia Sister   . Hypertension Father   . Hyperlipidemia Father   . Hyperlipidemia Mother   . Hypertension Mother   . Hypertension Daughter    - negative except otherwise stated in the family history section Allergies  Allergen Reactions  . Oxycodone Nausea And Vomiting  . Glipizide     Bad headache and blurred vision  . Bactrim [Sulfamethoxazole-Trimethoprim] Itching and Rash   Prior to Admission medications   Medication Sig Start Date End Date Taking? Authorizing Provider  aspirin EC 81 MG  tablet Take 81 mg by mouth daily.   Yes [provider]  clindamycin (CLEOCIN) 300 MG capsule Take 1 capsule by mouth every 6 (six) hours. 03/22/17  Yes [provider]  doxycycline (DORYX) 100 MG EC tablet Take 100 mg by mouth 2 (two) times daily.   Yes [provider]  fluticasone (FLONASE) 50 MCG/ACT nasal spray Place 2 sprays into both nostrils daily as needed for allergies or rhinitis.   Yes [provider]  gabapentin (NEURONTIN) 600 MG tablet Take 600 mg by mouth every evening.    Yes [provider]  lisinopril-hydrochlorothiazide (PRINZIDE,ZESTORETIC) 20-25 MG per tablet Take 1 tablet by mouth daily.   Yes [provider]    Podiatric Products (GOLD BOND FOOT) CREA Apply 1 application topically 4 (four) times daily.   Yes [provider]  simvastatin (ZOCOR) 20 MG tablet Take 20 mg by mouth at bedtime.    Yes [provider]  metFORMIN (GLUCOPHAGE) 500 MG tablet Take 500 mg by mouth 4 (four) times daily.     [provider]   Dg Foot Complete Right  Result Date: 04/01/2017 CLINICAL DATA:  Acute RIGHT foot pain following injury last week. EXAM: RIGHT FOOT COMPLETE - 3+ VIEW COMPARISON:  None. FINDINGS: There is no evidence of acute fracture, subluxation or dislocation. No evidence of acute osteomyelitis. No soft tissue abnormalities are present. Vascular calcifications are present. IMPRESSION: No evidence of acute abnormality. Electronically Signed   By: Harmon PierJeffrey  Hu M.D.   On: 04/01/2017 15:16   - pertinent xrays, CT, MRI studies were reviewed and independently interpreted  Positive ROS: All other systems have been reviewed and were otherwise negative with the exception of those mentioned in the HPI and as above.  Physical Exam: General: Alert, no acute distress Psychiatric: Patient is competent for consent with normal mood and affect Lymphatic: No axillary or cervical lymphadenopathy Cardiovascular: No pedal edema Respiratory: No cyanosis, no use of accessory musculature GI: No organomegaly, abdomen is soft and non-tender  Skin: Examination patient has dry gangrenous changes of the right great toe there is no ascending cellulitis.  Images:  @ENCIMAGES @   Neurologic: Patient does not have protective sensation bilateral lower extremities.   MUSCULOSKELETAL:  Patient does not have a palpable dorsalis pedis pulse.  There is no midfoot or hindfoot gangrenous changes.  He has swelling in the extremity with mild venous stasis changes.  Previous ankle-brachial indices study showed ABIs of about 60%.  Patient has undergone angiography in the past.  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    Plan: Plan: Recommend consultation with vascular vein surgery for Angiography evaluation to see if patient has any reconstructive or lesions.  At this time I do not know if patient has sufficient circulation to heal an amputation of the great toe.  Ankle-brachial indices are ordered.  N.p.o. discontinued for today.  Thank you for the consult and the opportunity to see James Dougherty  Era Parr, MD [facility] [phone] 7:01 AM

## 2017-04-02 NOTE — Consult Note (Addendum)
[facility] [place] CONSULT [place]   MRN : [id number]  Reason for Consult: Dry gangrene right GT Referring Physician: Dr. Lajoyce Cornersuda  History of Present Illness: 67 y/o male known to our office.  He was carrying a case of water 2 weeks ago and dropped it on his right great toe.  The open wound progressed to gangrene and cellulitis with a Tm of 105 this past Monday.  [facility] and was admitted for 2 days receiving IV antibiotics and then released.  He states he has developed calf pain bilaterally that started about a year ago.  He can ambulated about 50 yards before he has to stop and rest.  He is status post left superficial femoral and popliteal artery recanalization and subsequent stenting on 03/30/2012, followed by angioplasty of the stent 12/27/2012 for stenosis.  He is followed yearly and was last seen by our NP Rosalita ChessmanSuzanne Nickel on 12/09/2016.  At the time his ABI's were indicating sever disease of bilateral LE.    ABI (Date: 12/09/2016):  R:  ? ABI: 0.45 (was 0.63 on 11-25-15),  ? PT: mono ? DP: mono ? TBI:  0.11 (was 0.26)  L:  ? ABI: 0.70 (was 0.61),  ? PT: mono ? DP: mono ? TBI: 0.14 (was 0.38) Decline in bilateral ABI and TBI.    He denided new or increasing symptoms of  Claudication since his previous visits.  He has been using a stationary bike almost daily for an hour, feels like his legs have improved. After walking 60-70 yards his right thigh feels like it's burning, relieved by rest. He states his left leg rarely bothers him. He denies non healing wounds.    Past medical history includes: DM, Stroke with stable ICA stenosis,  PAD, and hypercholesterolemia.       Current Facility-Administered Medications  Medication Dose Route Frequency Provider Last Rate Last Dose  . acetaminophen (TYLENOL) tablet 650 mg  650 mg Oral Q6H PRN Pearson GrippeKim, Dravon, MD       Or  . acetaminophen (TYLENOL) suppository 650 mg  650 mg Rectal Q6H PRN Pearson GrippeKim, Hyatt, MD       . aspirin EC tablet 81 mg  81 mg Oral Daily Pearson GrippeKim, Chayse, MD      . enoxaparin (LOVENOX) injection 30 mg  30 mg Subcutaneous Q24H Pearson GrippeKim, Mahkai, MD      . gabapentin (NEURONTIN) tablet 600 mg  600 mg Oral QPM Pearson GrippeKim, Eliud, MD      . hydrALAZINE (APRESOLINE) injection 10 mg  10 mg Intravenous Q6H PRN Pearson GrippeKim, Omarian, MD      . insulin aspart (novoLOG) injection 0-9 Units  0-9 Units Subcutaneous Q4H Pearson GrippeKim, Akeel, MD      . piperacillin-tazobactam (ZOSYN) IVPB 3.375 g  3.375 g Intravenous Q8H Bajbus, Lauren D, RPH 12.5 mL/hr at 04/02/17 0709 3.375 g at 04/02/17 0709  . simvastatin (ZOCOR) tablet 20 mg  20 mg Oral QHS Pearson GrippeKim, Moise, MD      . vancomycin [facility](VANCOCIN) 1,500 mg in sodium chloride 0.9 % 500 mL IVPB  1,500 mg Intravenous Q24H Bajbus, Lauren D, RPH   Stopped at 04/01/17 2203    Pt meds include: Statin :Yes Betablocker: No ASA: Yes Other anticoagulants/antiplatelets: none  Past Medical History:  Diagnosis Date  . Arthritis    "hands" (12/27/2012)  . High cholesterol   . Hypertension   . Neuropathic pain   . PAD (peripheral artery disease) (HCC)   . Psoriasis   .  Stroke [facility]) 1999   "still have some speech problems at times; sometimes forget what I was going to say" (12/27/2012)  . Type II diabetes mellitus (HCC)   . Ulcer    Left ankle/leg    Past Surgical History:  Procedure Laterality Date  . ABDOMINAL AORTAGRAM N/A 03/30/2012   Procedure: ABDOMINAL Ronny Flurry;  Surgeon: Nada Libman, MD;  Location: [facility] CATH LAB;  Service: Cardiovascular;  Laterality: N/A;  . ABDOMINAL AORTAGRAM N/A 12/27/2012   Procedure: ABDOMINAL Ronny Flurry;  Surgeon: Nada Libman, MD;  Location: [facility] CATH LAB;  Service: Cardiovascular;  Laterality: N/A;  . ANGIOPLASTY / STENTING FEMORAL Left 12/27/2012  . FEMORAL ARTERY STENT  03/30/2012  . LOWER EXTREMITY ANGIOGRAM  12/27/2012   Procedure: LOWER EXTREMITY ANGIOGRAM;  Surgeon: Nada Libman, MD;  Location: [facility] CATH LAB;  Service: Cardiovascular;;  . POPLITEAL ARTERY  STENT    . TONSILLECTOMY AND ADENOIDECTOMY      Social History Social History   Tobacco Use  . Smoking status: Former Smoker    Packs/day: 3.00    Years: 45.00    Pack years: 135.00    Types: Cigarettes    Last attempt to quit: 01/07/2011    Years since quitting: 6.2  . Smokeless tobacco: Never Used  Substance Use Topics  . Alcohol use: Yes    Comment: 12/27/2012 "couple times/yr I might have a drink"  . Drug use: No    Family History Family History  Problem Relation Age of Onset  . Diabetes Sister        Bilateral amputation of lower legs  . Diabetes Sister   . Heart disease Sister 75       Heart disease before age 44  . Hypertension Sister   . Heart attack Sister   . Hyperlipidemia Sister   . Hypertension Father   . Hyperlipidemia Father   . Hyperlipidemia Mother   . Hypertension Mother   . Hypertension Daughter     Allergies  Allergen Reactions  . Oxycodone Nausea And Vomiting  . Glipizide     Bad headache and blurred vision  . Bactrim [Sulfamethoxazole-Trimethoprim] Itching and Rash     REVIEW OF SYSTEMS  General: [ ]  Weight loss, [ ]  Fever, [ ]  chills [x]  hard of hearing Neurologic: [ ]  Dizziness, [ ]  Blackouts, [ ]  Seizure [x ] Stroke, [ ]  "Mini stroke", [ ]  Slurred speech, [ ]  Temporary blindness; [ ]  weakness in arms or legs, [ ]  Hoarseness [ ]  Dysphagia Cardiac: [ ]  Chest pain/pressure, [ ]  Shortness of breath at rest [ ]  Shortness of breath with exertion, [ ]  Atrial fibrillation or irregular heartbeat  Vascular: [x ] Pain in legs with walking, [ ]  Pain in legs at rest, [ ]  Pain in legs at night,  [x ] Non-healing ulcer, [ ]  Blood clot in vein/DVT,   Pulmonary: [ ]  Home oxygen, [ ]  Productive cough, [ ]  Coughing up blood, [ ]  Asthma,  [ ]  Wheezing [ ]  COPD Musculoskeletal:  [ ]  Arthritis, [ ]  Low back pain, [ ]  Joint pain Hematologic: [ ]  Easy Bruising, [ ]  Anemia; [ ]  Hepatitis Gastrointestinal: [ ]  Blood in stool, [ ]  Gastroesophageal  Reflux/heartburn, Urinary: [ ]  chronic Kidney disease, [ ]  on HD - [ ]  MWF or [ ]  TTHS, [ ]  Burning with urination, [ ]  Difficulty urinating Skin: [ ]  Rashes, [x ] Wounds Psychological: [ ]  Anxiety, [ ]  Depression  Physical Examination Vitals:   04/01/17  1900 04/01/17 1930 04/01/17 2230 04/02/17 0630  BP: (!) 162/69 (!) 155/63 (!) 143/79 (!) 135/34  Pulse: 83 76 79 74  Resp: 19 18    Temp:   (!) 97.5 F (36.4 C) 98.2 F (36.8 C)  TempSrc:   Oral Oral  SpO2: 98% 100% 100% 99%  Weight:       Body mass index is 33.29 kg/m.  General:  WDWN in NAD HENT: WNL Eyes: Pupils equal Pulmonary: normal non-labored breathing , without Rales, rhonchi,  wheezing Cardiac: RRR, without  Murmurs, rubs or gallops;  carotid bruits Abdomen: soft, NT, no masses Skin: no rashes, ulcers noted;   right foot dorsum cellulitis GT gangrene cellulitis; no open wounds;         Vascular Exam/Pulses: radial, brachial pulses, weak femoral pulses B, non palpable pedal pulses.  Doppler left PT/DP, Right PT weak DP monophasic   Musculoskeletal: no muscle wasting or atrophy; bilateral LE edema  Neurologic: A&O X 3; Appropriate Affect ;  SENSATION: normal; MOTOR FUNCTION: 5/5 Symmetric Speech is fluent/normal   Significant Diagnostic Studies: CBC Lab Results  Component Value Date   WBC 9.0 04/02/2017   HGB 11.8 (L) 04/02/2017   HCT 36.7 (L) 04/02/2017   MCV 98.1 04/02/2017   PLT 429 (H) 04/02/2017    BMET    Component Value Date/Time   NA 137 04/02/2017 0559   K 5.3 (H) 04/02/2017 0559   CL 105 04/02/2017 0559   CO2 23 04/02/2017 0559   GLUCOSE 202 (H) 04/02/2017 0559   BUN 21 (H) 04/02/2017 0559   CREATININE 1.41 (H) 04/02/2017 0559   CALCIUM 9.1 04/02/2017 0559   GFRNONAA 50 (L) 04/02/2017 0559   GFRAA 58 (L) 04/02/2017 0559   Estimated Creatinine Clearance: 58.9 mL/min (A) (by C-G formula based on SCr of 1.41 mg/dL (H)).  COAG No results found for: INR,  PROTIME     ASSESSMENT/PLAN:  He has known PAD now with nonhealing right GT gangrene  Recent history of bilateral LE claudication.  We are recommending arteriogram with bilateral LE runoff and possible intervention.  He will need a right great toe amputation or more pending his vasculature based on the angiogram.    Angiogram scheduled for Monday 04/05/2017 with Dr. Randie Heinz.   Mosetta Pigeon 04/02/2017 11:08 AM  I have examined the patient, reviewed and agree with above.  Gust with the patient and his daughter present and also with Dr. Lajoyce Corners.  Prior history of left leg intervention with Dr. Myra Gianotti.  Does not have adequate flow to heal amputation of the dry gangrene of his entire right great toe.  Will plan arteriography and possible intervention on Monday, 04/05/2017  Gretta Began, MD 04/02/2017 12:45 PM

## 2017-04-03 LAB — CBC
HCT: 35.1 % — ABNORMAL LOW (ref 39.0–52.0)
HEMOGLOBIN: 11.3 g/dL — AB (ref 13.0–17.0)
MCH: 31.7 pg (ref 26.0–34.0)
MCHC: 32.2 g/dL (ref 30.0–36.0)
MCV: 98.3 fL (ref 78.0–100.0)
PLATELETS: 367 10*3/uL (ref 150–400)
RBC: 3.57 MIL/uL — AB (ref 4.22–5.81)
RDW: 12.7 % (ref 11.5–15.5)
WBC: 7.8 10*3/uL (ref 4.0–10.5)

## 2017-04-03 LAB — CALCIUM / CREATININE RATIO, URINE
CALCIUM UR: 0.8 mg/dL
Calcium/Creat.Ratio: 31 mg/g creat (ref 0–260)
Creatinine, Urine: 25.6 mg/dL

## 2017-04-03 LAB — BASIC METABOLIC PANEL
ANION GAP: 11 (ref 5–15)
BUN: 20 mg/dL (ref 6–20)
CALCIUM: 8.8 mg/dL — AB (ref 8.9–10.3)
CO2: 23 mmol/L (ref 22–32)
Chloride: 104 mmol/L (ref 101–111)
Creatinine, Ser: 1.4 mg/dL — ABNORMAL HIGH (ref 0.61–1.24)
GFR, EST AFRICAN AMERICAN: 59 mL/min — AB (ref >60)
GFR, EST NON AFRICAN AMERICAN: 51 mL/min — AB (ref >60)
GLUCOSE: 161 mg/dL — AB (ref 65–99)
Potassium: 4.3 mmol/L (ref 3.5–5.1)
Sodium: 138 mmol/L (ref 135–145)

## 2017-04-03 LAB — GLUCOSE, CAPILLARY
GLUCOSE-CAPILLARY: 145 mg/dL — AB (ref 65–99)
Glucose-Capillary: 149 mg/dL — ABNORMAL HIGH (ref 65–99)
Glucose-Capillary: 304 mg/dL — ABNORMAL HIGH (ref 65–99)
Glucose-Capillary: 192 mg/dL — ABNORMAL HIGH (ref 65–99)
Glucose-Capillary: 178 mg/dL — ABNORMAL HIGH (ref 65–99)
Glucose-Capillary: 238 mg/dL — ABNORMAL HIGH (ref 65–99)

## 2017-04-03 MED ORDER — ENOXAPARIN SODIUM 40 MG/0.4ML ~~LOC~~ SOLN
40.0000 mg | SUBCUTANEOUS | Status: DC
  Administered 2017-04-03 – 2017-04-04 (×2): 40 mg via SUBCUTANEOUS
  Filled 2017-04-03 (×2): qty 0.4

## 2017-04-03 NOTE — Progress Notes (Signed)
PROGRESS NOTE  James Dougherty  ZOX:[id number] DOB: 1950/04/07 DOA: 04/01/2017 PCP: Selinda Flavin, MD  Brief Narrative:   The patient is a 67 year old male with history of psoriasis, diabetes mellitus type 2, hypertension, hyperlipidemia, strokes, peripheral arterial disease who was recently diagnosed with a right lower extremity cellulitis and started on antibiotics.  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  He was started on antibiotics.  Dr. Lajoyce Corners was consulted who recommended the patient be seen by vascular surgery.  Vascular surgery has evaluated the patient and has arranged for an angiogram to be performed on Monday.    Assessment & Plan:  Gangrene and cellulitis of the 1st toe right foot -  Appreciate orthopedic and vascular surgery assistance - Blood culture x2, no growth to date - Continue Vanc, zosyn  Chronic kidney disease stage III, patient's creatinine is near baseline of 1.4-1.7 -  Minimize nephrotoxins and renally dose medications  Hyperkalemia improving after Kayexalate  Hypertension, blood pressure trending down - STOP Lisinopril due to hyperkalemia - d/c HCTZ due to renal insufficiency -Continue Norvasc -  Continue Hydralazine 10mg  iv q6h prn sbp >160  PAD Cont aspirin 81mg  po qday Cont Simvastatin  DM2 STOP METFORMIN Due to Creatinine 1.7 fsbs q4h, ISS   DVT prophylaxis: Lovenox Code Status: Full code Family Communication: Patient alone Disposition Plan: Patient to undergo an arteriogram on Monday to determine if there are any areas of the right lower extremity where revascularization may preserve his toe   Consultants:   Dr. Lajoyce Corners, orthopedic surgery  Dr. early, vascular surgery  Procedures:  None  Antimicrobials:  Anti-infectives (From admission,  onward)   Start     Dose/Rate Route Frequency Ordered Stop   04/03/17 0300  piperacillin-tazobactam (ZOSYN) IVPB 3.375 g     3.375 g 12.5 mL/hr over 240 Minutes Intravenous Every 8 hours 04/02/17 2024     04/01/17 2330  piperacillin-tazobactam (ZOSYN) IVPB 3.375 g  Status:  Discontinued     3.375 g 12.5 mL/hr over 240 Minutes Intravenous Every 8 hours 04/01/17 1843 04/02/17 2024   04/01/17 1930  vancomycin (VANCOCIN) 1,500 mg in sodium chloride 0.9 % 500 mL IVPB     1,500 mg 250 mL/hr over 120 Minutes Intravenous Every 24 hours 04/01/17 1839     04/01/17 1815  piperacillin-tazobactam (ZOSYN) IVPB 3.375 g     3.375 g 100 mL/hr over 30 Minutes Intravenous  Once 04/01/17 1812 04/01/17 1858       Subjective:  Bee sting sensation has improved today.  The blackness around his toe has receded some.  He denies fevers, chills.  He is eating well. Objective: Vitals:   04/02/17 2020 04/03/17 0410 04/03/17 0510 04/03/17 1300  BP: (!) 136/38 (!) 157/47  (!) 124/52  Pulse: 81   80  Resp:    18  Temp: 98.6 F (37 C) 98.9 F (37.2 C)  98.7 F (37.1 C)  TempSrc: Oral   Oral  SpO2: 99% 98%  97%  Weight:   96 kg (211 lb 10.3 oz)     Intake/Output Summary (Last 24 hours) at 04/03/2017 1602 Last data filed at 04/03/2017 1300 Gross per 24 hour  Intake 1940 ml  Output -  Net 1940 ml   Filed Weights   04/01/17 1815  04/03/17 0510  Weight: 99.3 kg (218 lb 14.7 oz) 96 kg (211 lb 10.3 oz)    Examination:  General exam:  Adult male.  No acute distress.  HEENT:  NCAT, MMM Respiratory system: Clear to auscultation bilaterally Cardiovascular system: Regular rate and rhythm, normal S1/S2. No murmurs, rubs, gallops or clicks.  Warm extremities Gastrointestinal system: Normal active bowel sounds, soft, nondistended, nontender. MSK:  Normal tone and bulk, black discoloration now is apparent along the proximal phalanx on the dorsum aspect of the toe and then extends more proximally on the plantar  aspect of the toe.  The erythema surrounding the gangrene has receded some.  Less tender to palpation today. Neuro:  Grossly intact   Data Reviewed: I have personally reviewed following labs and imaging studies  CBC: Recent Labs  Lab 04/01/17 1446 04/02/17 0559 04/03/17 0349  WBC 10.2 9.0 7.8  NEUTROABS 6.8  --   --   HGB 11.8* 11.8* 11.3*  HCT 36.3* 36.7* 35.1*  MCV 98.1 98.1 98.3  PLT 431* 429* 367   Basic Metabolic Panel: Recent Labs  Lab 04/01/17 1446 04/02/17 0559 04/03/17 0349  NA 134* 137 138  K 5.5* 5.3* 4.3  CL 100* 105 104  CO2 23 23 23   GLUCOSE 277* 202* 161*  BUN 28* 21* 20  CREATININE 1.70* 1.41* 1.40*  CALCIUM 9.2 9.1 8.8*   GFR: Estimated Creatinine Clearance: 58.3 mL/min (A) (by C-G formula based on SCr of 1.4 mg/dL (H)). Liver Function Tests: Recent Labs  Lab 04/01/17 1446 04/02/17 0559  AST 20 17  ALT 19 17  ALKPHOS 71 70  BILITOT 0.8 1.1  PROT 7.0 6.4*  ALBUMIN 3.1* 2.9*   No results for input(s): LIPASE, AMYLASE in the last 168 hours. No results for input(s): AMMONIA in the last 168 hours. Coagulation Profile: No results for input(s): INR, PROTIME in the last 168 hours. Cardiac Enzymes: No results for input(s): CKTOTAL, CKMB, CKMBINDEX, TROPONINI in the last 168 hours. BNP (last 3 results) No results for input(s): PROBNP in the last 8760 hours. HbA1C: No results for input(s): HGBA1C in the last 72 hours. CBG: Recent Labs  Lab 04/02/17 2024 04/03/17 0013 04/03/17 0431 04/03/17 0903 04/03/17 1156  GLUCAP 188* 145* 149* 304* 192*   Lipid Profile: No results for input(s): CHOL, HDL, LDLCALC, TRIG, CHOLHDL, LDLDIRECT in the last 72 hours. Thyroid Function Tests: No results for input(s): TSH, T4TOTAL, FREET4, T3FREE, THYROIDAB in the last 72 hours. Anemia Panel: No results for input(s): VITAMINB12, FOLATE, FERRITIN, TIBC, IRON, RETICCTPCT in the last 72 hours. Urine analysis:    Component Value Date/Time   COLORURINE STRAW (A)  04/02/2017 0218   APPEARANCEUR CLEAR 04/02/2017 0218   LABSPEC 1.005 04/02/2017 0218   PHURINE 6.0 04/02/2017 0218   GLUCOSEU 150 (A) 04/02/2017 0218   HGBUR NEGATIVE 04/02/2017 0218   BILIRUBINUR NEGATIVE 04/02/2017 0218   KETONESUR NEGATIVE 04/02/2017 0218   PROTEINUR NEGATIVE 04/02/2017 0218   NITRITE NEGATIVE 04/02/2017 0218   LEUKOCYTESUR NEGATIVE 04/02/2017 0218   Sepsis Labs: @LABRCNTIP (procalcitonin:4,lacticidven:4)  ) Recent Results (from the past 240 hour(s))  Culture, blood (routine x 2)     Status: None (Preliminary result)   Collection Time: 04/01/17 10:54 PM  Result Value Ref Range Status   Specimen Description BLOOD LEFT ANTECUBITAL  Final   Special Requests AEROBIC BOTTLE ONLY Blood Culture adequate volume  Final   Culture   Final    NO GROWTH 1 DAY Performed at [facility], [address] [place], [place] [address]    Report Status PENDING  Incomplete  Culture, blood (routine x 2)     Status: None (Preliminary result)   Collection Time: 04/01/17 11:04 PM  Result Value Ref Range Status   Specimen Description BLOOD LEFT HAND  Final   Special Requests AEROBIC BOTTLE ONLY Blood Culture adequate volume  Final   Culture   Final    NO GROWTH 1 DAY Performed at [facility], [address] [place], [place] [address]    Report Status PENDING  Incomplete      Radiology Studies: [place] Renal  Result Date: 04/02/2017 CLINICAL DATA:  Renal insufficiency EXAM: RENAL / URINARY TRACT ULTRASOUND COMPLETE COMPARISON:  None. FINDINGS: Right Kidney: Length: 10.3 cm. Echogenicity within normal limits. No mass or hydronephrosis visualized. Left Kidney: Length: 11.6 cm. 2 cm left upper pole cyst. Echogenicity within normal limits. No mass or hydronephrosis visualized. Bladder: Appears normal for degree of bladder distention. Cholelithiasis IMPRESSION: No significant renal abnormality. Cholelithiasis Electronically Signed   By: Marlan Palauharles  Clark M.D.   On: 04/02/2017 07:36       Scheduled Meds: . amLODipine  5 mg Oral Daily  . aspirin EC  81 mg Oral Daily  . enoxaparin (LOVENOX) injection  40 mg Subcutaneous Q24H  . gabapentin  600 mg Oral QPM  . insulin aspart  0-9 Units Subcutaneous Q4H  . simvastatin  20 mg Oral QHS   Continuous Infusions: . piperacillin-tazobactam (ZOSYN)  IV 3.375 g (04/03/17 1230)  . vancomycin Stopped (04/02/17 2229)     LOS: 2 days    Time spent: 30 min    Renae FickleMackenzie Meshawn Oconnor, MD [facility] Pager [phone]  If 7PM-7AM, please contact night-coverage [email or web address] Password [email or web address] 04/03/2017, 4:02 PM

## 2017-04-03 NOTE — Plan of Care (Signed)
  Activity: Risk for activity intolerance will decrease 04/03/2017 1103 - Progressing by Darrow BussingArcilla, Eiliyah Reh M, RN   Nutrition: Adequate nutrition will be maintained 04/03/2017 1103 - Progressing by Darrow BussingArcilla, Keegan Bensch M, RN   Elimination: Will not experience complications related to bowel motility 04/03/2017 1103 - Progressing by Darrow BussingArcilla, Macall Mccroskey M, RN   Pain Managment: General experience of comfort will improve 04/03/2017 1103 - Progressing by Darrow BussingArcilla, Yarieliz Wasser M, RN   Safety: Ability to remain free from injury will improve 04/03/2017 1103 - Progressing by Darrow BussingArcilla, Quinisha Mould M, RN

## 2017-04-04 DIAGNOSIS — E118 Type 2 diabetes mellitus with unspecified complications: Secondary | ICD-10-CM

## 2017-04-04 LAB — BASIC METABOLIC PANEL
Anion gap: 10 (ref 5–15)
BUN: 18 mg/dL (ref 6–20)
CALCIUM: 9 mg/dL (ref 8.9–10.3)
CHLORIDE: 103 mmol/L (ref 101–111)
CO2: 23 mmol/L (ref 22–32)
CREATININE: 1.55 mg/dL — AB (ref 0.61–1.24)
GFR calc non Af Amer: 45 mL/min — ABNORMAL LOW (ref >60)
GFR, EST AFRICAN AMERICAN: 52 mL/min — AB (ref >60)
Glucose, Bld: 183 mg/dL — ABNORMAL HIGH (ref 65–99)
Potassium: 4.3 mmol/L (ref 3.5–5.1)
SODIUM: 136 mmol/L (ref 135–145)

## 2017-04-04 LAB — GLUCOSE, CAPILLARY
GLUCOSE-CAPILLARY: 148 mg/dL — AB (ref 65–99)
GLUCOSE-CAPILLARY: 283 mg/dL — AB (ref 65–99)
GLUCOSE-CAPILLARY: 302 mg/dL — AB (ref 65–99)
Glucose-Capillary: 161 mg/dL — ABNORMAL HIGH (ref 65–99)
Glucose-Capillary: 197 mg/dL — ABNORMAL HIGH (ref 65–99)
Glucose-Capillary: 217 mg/dL — ABNORMAL HIGH (ref 65–99)

## 2017-04-04 LAB — CBC
HCT: 37.2 % — ABNORMAL LOW (ref 39.0–52.0)
Hemoglobin: 11.9 g/dL — ABNORMAL LOW (ref 13.0–17.0)
MCH: 31.7 pg (ref 26.0–34.0)
MCHC: 32 g/dL (ref 30.0–36.0)
MCV: 99.2 fL (ref 78.0–100.0)
PLATELETS: 389 10*3/uL (ref 150–400)
RBC: 3.75 MIL/uL — AB (ref 4.22–5.81)
RDW: 12.9 % (ref 11.5–15.5)
WBC: 7.8 10*3/uL (ref 4.0–10.5)

## 2017-04-04 LAB — VANCOMYCIN, TROUGH: VANCOMYCIN TR: 16 ug/mL (ref 15–20)

## 2017-04-04 MED ORDER — INSULIN ASPART 100 UNIT/ML ~~LOC~~ SOLN
0.0000 [IU] | Freq: Three times a day (TID) | SUBCUTANEOUS | Status: DC
  Administered 2017-04-04: 3 [IU] via SUBCUTANEOUS
  Administered 2017-04-04 – 2017-04-06 (×3): 2 [IU] via SUBCUTANEOUS
  Administered 2017-04-06: 5 [IU] via SUBCUTANEOUS

## 2017-04-04 NOTE — Progress Notes (Signed)
     Patient pre-op for angiogram and possible intervention tomorrow with Dr. Darrick PennaFields. NPO past MN  [employer]R CorporationEmma Maureen Tavyn Kurka PA-C

## 2017-04-04 NOTE — Progress Notes (Signed)
Pharmacy Antibiotic Note  James SideJames F Dougherty is a 67 y.o. male admitted on 04/01/2017 with cellulitis and gangrene of the right great toe.  Pharmacy has been consulted for vancomycin and zosyn dosing. WBC WNL and patient afebrile. Vascular intervention planned for tomorrow. SCr up slightly to 1.55. Vancomycin trough this evening is therapeutic at 16.    Plan: Continue Vancomycin 1500mg  IV q24h Continue Zosyn 3.375g IV q8h EI Follow c/s, clinical progression, renal function, level PRN, LOT Follow-up ortho and vascular surgery plans   Height: 5\' 8"  (172.7 cm) Weight: 214 lb 11.7 oz (97.4 kg) IBW/kg (Calculated) : 68.4  Temp (24hrs), Avg:98.2 F (36.8 C), Min:97.7 F (36.5 C), Max:98.8 F (37.1 C)  Recent Labs  Lab 04/01/17 1446 04/01/17 1515 04/01/17 1722 04/02/17 0559 04/03/17 0349 04/04/17 0549 04/04/17 1824  WBC 10.2  --   --  9.0 7.8 7.8  --   CREATININE 1.70*  --   --  1.41* 1.40* 1.55*  --   LATICACIDVEN  --  2.82* 1.49  --   --   --   --   VANCOTROUGH  --   --   --   --   --   --  16    Estimated Creatinine Clearance: 53 mL/min (A) (by C-G formula based on SCr of 1.55 mg/dL (H)).    Allergies  Allergen Reactions  . Oxycodone Nausea And Vomiting  . Glipizide     Bad headache and blurred vision  . Bactrim [Sulfamethoxazole-Trimethoprim] Itching and Rash    Antimicrobials this admission: Vancomycin 3/7 >>  Zosyn 3/7 >>   Dose adjustments this admission: 3/10 VT= 16- No change   Microbiology results: 3/7 BCx: NGTD  Thank you for allowing pharmacy to be a part of this patient's care.  Sharin MonsEmily Persis Graffius, PharmD, BCPS PGY2 Infectious Diseases Pharmacy Resident Pager: [phone]  04/04/2017 7:20 PM

## 2017-04-04 NOTE — Progress Notes (Signed)
PROGRESS NOTE  James Dougherty  RUE:[id number] DOB: Dec 20, 1950 DOA: 04/01/2017 PCP: Selinda Flavin, MD  Brief Narrative:   The patient is a 67 year old male with history of psoriasis, diabetes mellitus type 2, hypertension, hyperlipidemia, strokes, peripheral arterial disease who was recently diagnosed with a right lower extremity cellulitis and started on antibiotics.  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  He was started on antibiotics.  Dr. Lajoyce Corners was consulted who recommended the patient be seen by vascular surgery.  Vascular surgery has evaluated the patient and has arranged for an angiogram to be performed on Monday.    Assessment & Plan:  Gangrene and cellulitis of the 1st toe right foot -  Appreciate orthopedic and vascular surgery assistance - Blood culture x2, no growth to date - Continue Vanc, zosyn - NPO at MN for angiography tomorrow  Chronic kidney disease stage III, patient's creatinine is near baseline of 1.4-1.7 -  Minimize nephrotoxins and renally dose medications  Hyperkalemia improving after Kayexalate  Hypertension, blood pressure trending down - STOP Lisinopril due to hyperkalemia - d/c HCTZ due to renal insufficiency -  Continue Norvasc -  Continue Hydralazine 10mg  iv q6h prn sbp >160  PAD Cont aspirin 81mg  po qday Cont Simvastatin  DM2 STOP METFORMIN Due to Creatinine 1.7 fsbs q4h, ISS   DVT prophylaxis: Lovenox Code Status: Full code Family Communication: Patient alone Disposition Plan: Patient to undergo an arteriogram on Monday to determine if there are any areas of the right lower extremity where revascularization may preserve his toe   Consultants:   Dr. Lajoyce Corners, orthopedic surgery  Dr. early, vascular surgery  Procedures:   None  Antimicrobials:  Anti-infectives (From admission, onward)   Start     Dose/Rate Route Frequency Ordered Stop   04/03/17 0300  piperacillin-tazobactam (ZOSYN) IVPB 3.375 g     3.375 g 12.5 mL/hr over 240 Minutes Intravenous Every 8 hours 04/02/17 2024     04/01/17 2330  piperacillin-tazobactam (ZOSYN) IVPB 3.375 g  Status:  Discontinued     3.375 g 12.5 mL/hr over 240 Minutes Intravenous Every 8 hours 04/01/17 1843 04/02/17 2024   04/01/17 1930  vancomycin (VANCOCIN) 1,500 mg in sodium chloride 0.9 % 500 mL IVPB     1,500 mg 250 mL/hr over 120 Minutes Intravenous Every 24 hours 04/01/17 1839     04/01/17 1815  piperacillin-tazobactam (ZOSYN) IVPB 3.375 g     3.375 g 100 mL/hr over 30 Minutes Intravenous  Once 04/01/17 1812 04/01/17 1858       Subjective:  Minimal pain in the right great toe.   He denies fevers, chills.  He is eating well.  He would like to take a shower.    Objective: Vitals:   04/03/17 1300 04/03/17 2124 04/04/17 0411 04/04/17 1100  BP: (!) 124/52 (!) 154/60 (!) 143/60   Pulse: 80 74 68   Resp: 18 16 16    Temp: 98.7 F (37.1 C) 98.2 F (36.8 C) 97.7 F (36.5 C)   TempSrc: Oral Oral Oral   SpO2: 97% 97% 96%   Weight:   97.4 kg (214 lb 11.7 oz)   Height:    5\' 8"  (1.727 m)    Intake/Output Summary (Last 24 hours) at 04/04/2017 1309 Last data filed at 04/03/2017 1900 Gross  per 24 hour  Intake 480 ml  Output -  Net 480 ml   Filed Weights   04/01/17 1815 04/03/17 0510 04/04/17 0411  Weight: 99.3 kg (218 lb 14.7 oz) 96 kg (211 lb 10.3 oz) 97.4 kg (214 lb 11.7 oz)    Examination:  General exam:  Adult male.  No acute distress.  HEENT:  NCAT, MMM Respiratory system: Clear to auscultation bilaterally Cardiovascular system: Regular rate and rhythm, normal S1/S2. No murmurs, rubs, gallops or clicks.  Warm extremities Gastrointestinal system: Normal active bowel sounds, soft, nondistended, nontender. MSK:  Normal tone and bulk, no lower extremity  edema.  Right toe at the proximal end of the gangrenous area is turning slightly more pink.  Tip of toe is still black.  Decreasing TTP Neuro:  Grossly intact   Data Reviewed: I have personally reviewed following labs and imaging studies  CBC: Recent Labs  Lab 04/01/17 1446 04/02/17 0559 04/03/17 0349 04/04/17 0549  WBC 10.2 9.0 7.8 7.8  NEUTROABS 6.8  --   --   --   HGB 11.8* 11.8* 11.3* 11.9*  HCT 36.3* 36.7* 35.1* 37.2*  MCV 98.1 98.1 98.3 99.2  PLT 431* 429* 367 389   Basic Metabolic Panel: Recent Labs  Lab 04/01/17 1446 04/02/17 0559 04/03/17 0349 04/04/17 0549  NA 134* 137 138 136  K 5.5* 5.3* 4.3 4.3  CL 100* 105 104 103  CO2 23 23 23 23   GLUCOSE 277* 202* 161* 183*  BUN 28* 21* 20 18  CREATININE 1.70* 1.41* 1.40* 1.55*  CALCIUM 9.2 9.1 8.8* 9.0   GFR: Estimated Creatinine Clearance: 53 mL/min (A) (by C-G formula based on SCr of 1.55 mg/dL (H)). Liver Function Tests: Recent Labs  Lab 04/01/17 1446 04/02/17 0559  AST 20 17  ALT 19 17  ALKPHOS 71 70  BILITOT 0.8 1.1  PROT 7.0 6.4*  ALBUMIN 3.1* 2.9*   No results for input(s): LIPASE, AMYLASE in the last 168 hours. No results for input(s): AMMONIA in the last 168 hours. Coagulation Profile: No results for input(s): INR, PROTIME in the last 168 hours. Cardiac Enzymes: No results for input(s): CKTOTAL, CKMB, CKMBINDEX, TROPONINI in the last 168 hours. BNP (last 3 results) No results for input(s): PROBNP in the last 8760 hours. HbA1C: No results for input(s): HGBA1C in the last 72 hours. CBG: Recent Labs  Lab 04/03/17 2012 04/04/17 0022 04/04/17 0410 04/04/17 0835 04/04/17 1150  GLUCAP 238* 148* 161* 302* 197*   Lipid Profile: No results for input(s): CHOL, HDL, LDLCALC, TRIG, CHOLHDL, LDLDIRECT in the last 72 hours. Thyroid Function Tests: No results for input(s): TSH, T4TOTAL, FREET4, T3FREE, THYROIDAB in the last 72 hours. Anemia Panel: No results for input(s): VITAMINB12, FOLATE,  FERRITIN, TIBC, IRON, RETICCTPCT in the last 72 hours. Urine analysis:    Component Value Date/Time   COLORURINE STRAW (A) 04/02/2017 0218   APPEARANCEUR CLEAR 04/02/2017 0218   LABSPEC 1.005 04/02/2017 0218   PHURINE 6.0 04/02/2017 0218   GLUCOSEU 150 (A) 04/02/2017 0218   HGBUR NEGATIVE 04/02/2017 0218   BILIRUBINUR NEGATIVE 04/02/2017 0218   KETONESUR NEGATIVE 04/02/2017 0218   PROTEINUR NEGATIVE 04/02/2017 0218   NITRITE NEGATIVE 04/02/2017 0218   LEUKOCYTESUR NEGATIVE 04/02/2017 0218   Sepsis Labs: @LABRCNTIP (procalcitonin:4,lacticidven:4)  ) Recent Results (from the past 240 hour(s))  Culture, blood (routine x 2)     Status: None (Preliminary result)   Collection Time: 04/01/17 10:54 PM  Result Value Ref Range Status   Specimen Description BLOOD  LEFT ANTECUBITAL  Final   Special Requests AEROBIC BOTTLE ONLY Blood Culture adequate volume  Final   Culture   Final    NO GROWTH 1 DAY Performed at [facility], [address] [place], [place] [address]    Report Status PENDING  Incomplete  Culture, blood (routine x 2)     Status: None (Preliminary result)   Collection Time: 04/01/17 11:04 PM  Result Value Ref Range Status   Specimen Description BLOOD LEFT HAND  Final   Special Requests AEROBIC BOTTLE ONLY Blood Culture adequate volume  Final   Culture   Final    NO GROWTH 1 DAY Performed at [facility], [address] [place], [place] [address]    Report Status PENDING  Incomplete      Radiology Studies: No results found.   Scheduled Meds: . amLODipine  5 mg Oral Daily  . aspirin EC  81 mg Oral Daily  . enoxaparin (LOVENOX) injection  40 mg Subcutaneous Q24H  . gabapentin  600 mg Oral QPM  . insulin aspart  0-9 Units Subcutaneous TID WC  . simvastatin  20 mg Oral QHS   Continuous Infusions: . piperacillin-tazobactam (ZOSYN)  IV 3.375 g (04/04/17 1225)  . vancomycin Stopped (04/03/17 2036)     LOS: 3 days    Time spent: 30  min    Renae FickleMackenzie Rashaun Wichert, MD [facility] Pager [phone]  If 7PM-7AM, please contact night-coverage [email or web address] Password [email or web address] 04/04/2017, 1:09 PM

## 2017-04-05 ENCOUNTER — Ambulatory Visit (HOSPITAL_COMMUNITY): Admit: 2017-04-05 | Payer: Medicare Other | Admitting: Vascular Surgery

## 2017-04-05 ENCOUNTER — Encounter (HOSPITAL_COMMUNITY)
Admission: EM | Disposition: A | Discharge status: Home / Self Care | Payer: Self-pay | Source: Home / Self Care | Attending: Internal Medicine

## 2017-04-05 HISTORY — PX: ABDOMINAL AORTOGRAM W/LOWER EXTREMITY: CATH118223

## 2017-04-05 HISTORY — PX: PERIPHERAL VASCULAR INTERVENTION: CATH118257

## 2017-04-05 HISTORY — PX: PERIPHERAL VASCULAR ATHERECTOMY: CATH118256

## 2017-04-05 LAB — BASIC METABOLIC PANEL
Anion gap: 10 (ref 5–15)
BUN: 20 mg/dL (ref 6–20)
CHLORIDE: 103 mmol/L (ref 101–111)
CO2: 23 mmol/L (ref 22–32)
Calcium: 8.8 mg/dL — ABNORMAL LOW (ref 8.9–10.3)
Creatinine, Ser: 1.49 mg/dL — ABNORMAL HIGH (ref 0.61–1.24)
GFR calc non Af Amer: 47 mL/min — ABNORMAL LOW (ref >60)
GFR, EST AFRICAN AMERICAN: 55 mL/min — AB (ref >60)
Glucose, Bld: 218 mg/dL — ABNORMAL HIGH (ref 65–99)
POTASSIUM: 4.3 mmol/L (ref 3.5–5.1)
SODIUM: 136 mmol/L (ref 135–145)

## 2017-04-05 LAB — PROTIME-INR
INR: 1.1
Prothrombin Time: 14.1 seconds (ref 11.4–15.2)

## 2017-04-05 LAB — CBC
HEMATOCRIT: 36 % — AB (ref 39.0–52.0)
Hemoglobin: 11.6 g/dL — ABNORMAL LOW (ref 13.0–17.0)
MCH: 31.8 pg (ref 26.0–34.0)
MCHC: 32.2 g/dL (ref 30.0–36.0)
MCV: 98.6 fL (ref 78.0–100.0)
Platelets: 367 10*3/uL (ref 150–400)
RBC: 3.65 MIL/uL — AB (ref 4.22–5.81)
RDW: 12.6 % (ref 11.5–15.5)
WBC: 7.8 10*3/uL (ref 4.0–10.5)

## 2017-04-05 LAB — GLUCOSE, CAPILLARY
GLUCOSE-CAPILLARY: 200 mg/dL — AB (ref 65–99)
Glucose-Capillary: 185 mg/dL — ABNORMAL HIGH (ref 65–99)
Glucose-Capillary: 165 mg/dL — ABNORMAL HIGH (ref 65–99)
Glucose-Capillary: 149 mg/dL — ABNORMAL HIGH (ref 65–99)

## 2017-04-05 LAB — POCT ACTIVATED CLOTTING TIME
Activated Clotting Time: 213 seconds
Activated Clotting Time: 191 seconds

## 2017-04-05 SURGERY — ABDOMINAL AORTOGRAM W/LOWER EXTREMITY
Anesthesia: LOCAL | Laterality: Right

## 2017-04-05 MED ORDER — FENTANYL CITRATE (PF) 100 MCG/2ML IJ SOLN
INTRAMUSCULAR | Status: DC | PRN
  Administered 2017-04-05 (×2): 25 ug via INTRAVENOUS

## 2017-04-05 MED ORDER — HEPARIN (PORCINE) IN NACL 2-0.9 UNIT/ML-% IJ SOLN
INTRAMUSCULAR | Status: AC
  Filled 2017-04-05: qty 1000

## 2017-04-05 MED ORDER — HYDRALAZINE HCL 20 MG/ML IJ SOLN: 5.0000 mg | INTRAMUSCULAR | Status: DC | PRN

## 2017-04-05 MED ORDER — ACETAMINOPHEN 325 MG PO TABS: 650.0000 mg | ORAL_TABLET | ORAL | Status: DC | PRN

## 2017-04-05 MED ORDER — SODIUM CHLORIDE 0.9 % IV SOLN: INTRAVENOUS | Status: AC

## 2017-04-05 MED ORDER — LIDOCAINE HCL (PF) 1 % IJ SOLN
INTRAMUSCULAR | Status: DC | PRN
Start: 2017-04-05 — End: 2017-04-05
  Administered 2017-04-05: 13 mL

## 2017-04-05 MED ORDER — SODIUM CHLORIDE 0.9 % IV SOLN: 250.0000 mL | INTRAVENOUS | Status: DC | PRN

## 2017-04-05 MED ORDER — ONDANSETRON HCL 4 MG/2ML IJ SOLN: 4.0000 mg | Freq: Four times a day (QID) | INTRAMUSCULAR | Status: DC | PRN

## 2017-04-05 MED ORDER — SODIUM CHLORIDE 0.9% FLUSH: 3.0000 mL | INTRAVENOUS | Status: DC | PRN

## 2017-04-05 MED ORDER — SODIUM CHLORIDE 0.9 % IV SOLN
INTRAVENOUS | Status: DC
Start: 2017-04-05 — End: 2017-04-06
  Administered 2017-04-05 (×2): via INTRAVENOUS

## 2017-04-05 MED ORDER — MORPHINE SULFATE (PF) 2 MG/ML IV SOLN: 2.0000 mg | INTRAVENOUS | Status: DC | PRN

## 2017-04-05 MED ORDER — FENTANYL CITRATE (PF) 100 MCG/2ML IJ SOLN
INTRAMUSCULAR | Status: AC
  Filled 2017-04-05: qty 2

## 2017-04-05 MED ORDER — HEPARIN SODIUM (PORCINE) 1000 UNIT/ML IJ SOLN
INTRAMUSCULAR | Status: DC | PRN
  Administered 2017-04-05: 10000 [IU] via INTRAVENOUS
  Administered 2017-04-05 (×2): 2000 [IU] via INTRAVENOUS

## 2017-04-05 MED ORDER — HEPARIN SODIUM (PORCINE) 1000 UNIT/ML IJ SOLN
INTRAMUSCULAR | Status: AC
  Filled 2017-04-05: qty 1

## 2017-04-05 MED ORDER — IODIXANOL 320 MG/ML IV SOLN
INTRAVENOUS | Status: DC | PRN
  Administered 2017-04-05: 200 mL via INTRA_ARTERIAL

## 2017-04-05 MED ORDER — LABETALOL HCL 5 MG/ML IV SOLN: 10.0000 mg | INTRAVENOUS | Status: DC | PRN

## 2017-04-05 MED ORDER — CLOPIDOGREL BISULFATE 300 MG PO TABS
ORAL_TABLET | ORAL | Status: AC
  Filled 2017-04-05: qty 1

## 2017-04-05 MED ORDER — HEPARIN (PORCINE) IN NACL 2-0.9 UNIT/ML-% IJ SOLN
INTRAMUSCULAR | Status: AC | PRN
  Administered 2017-04-05 (×2): 500 mL

## 2017-04-05 MED ORDER — HEPARIN SODIUM (PORCINE) 1000 UNIT/ML IJ SOLN
INTRAMUSCULAR | Status: AC
Start: 2017-04-05 — End: ?
  Filled 2017-04-05: qty 1

## 2017-04-05 MED ORDER — SODIUM CHLORIDE 0.9% FLUSH: 3.0000 mL | Freq: Two times a day (BID) | INTRAVENOUS | Status: DC

## 2017-04-05 MED ORDER — CLOPIDOGREL BISULFATE 75 MG PO TABS
300.0000 mg | ORAL_TABLET | Freq: Once | ORAL | Status: AC
  Administered 2017-04-05: 300 mg via ORAL

## 2017-04-05 MED ORDER — LIDOCAINE HCL 1 % IJ SOLN
INTRAMUSCULAR | Status: AC
  Filled 2017-04-05: qty 20

## 2017-04-05 MED ORDER — CLOPIDOGREL BISULFATE 75 MG PO TABS
75.0000 mg | ORAL_TABLET | Freq: Every day | ORAL | Status: DC
  Administered 2017-04-06: 75 mg via ORAL
  Filled 2017-04-05: qty 1

## 2017-04-05 MED ORDER — OXYCODONE HCL 5 MG PO TABS: 5.0000 mg | ORAL_TABLET | ORAL | Status: DC | PRN

## 2017-04-05 MED ORDER — INSULIN DETEMIR 100 UNIT/ML ~~LOC~~ SOLN
8.0000 [IU] | Freq: Every day | SUBCUTANEOUS | Status: DC
  Administered 2017-04-05: 8 [IU] via SUBCUTANEOUS
  Filled 2017-04-05 (×3): qty 0.08

## 2017-04-05 SURGICAL SUPPLY — 31 items
BALLN ADMIRAL INPACT 5X250 (BALLOONS) ×3
BALLN MUSTANG 7.0X40 75 (BALLOONS) ×3
BALLN STERLING OTW 5X150X150 (BALLOONS) ×3
BALLOON ADMIRAL INPACT 5X250 (BALLOONS) ×2 IMPLANT
BALLOON MUSTANG 7.0X40 75 (BALLOONS) ×2 IMPLANT
BALLOON STERLING OTW 5X150X150 (BALLOONS) ×2 IMPLANT
BUR JETSTREAM XC 2.1/3.0 (BURR) ×2 IMPLANT
BURR JETSTREAM XC 2.1/3.0 (BURR) ×3
CATH ANGIO 5F PIGTAIL 65CM (CATHETERS) ×3 IMPLANT
CATH CROSS OVER TEMPO 5F (CATHETERS) ×3 IMPLANT
CATH QUICKCROSS .035X135CM (MICROCATHETER) ×3 IMPLANT
CATH QUICKCROSS SUPP .035X90CM (MICROCATHETER) ×3 IMPLANT
COVER PRB 48X5XTLSCP FOLD TPE (BAG) ×2 IMPLANT
COVER PROBE 5X48 (BAG) ×1
DEVICE EMBOSHIELD NAV6 4.0-7.0 (FILTER) ×3 IMPLANT
DEVICE TORQUE .025-.038 (MISCELLANEOUS) ×3 IMPLANT
GUIDEWIRE ANGLED .035X260CM (WIRE) ×3 IMPLANT
KIT ENCORE 26 ADVANTAGE (KITS) ×3 IMPLANT
KIT PV (KITS) ×3 IMPLANT
LUBRICANT VIPERSLIDE CORONARY (MISCELLANEOUS) ×3 IMPLANT
SHEATH AVANTI 11CM 5FR (MISCELLANEOUS) ×3 IMPLANT
SHEATH PINNACLE ST 7F 45CM (SHEATH) ×3 IMPLANT
STENT INNOVA 6X150X130 (Permanent Stent) ×3 IMPLANT
STENT INNOVA 7X40X130 (Permanent Stent) ×3 IMPLANT
SYR MEDRAD MARK V 150ML (SYRINGE) ×3 IMPLANT
TRANSDUCER W/STOPCOCK (MISCELLANEOUS) ×3 IMPLANT
TRAY PV CATH (CUSTOM PROCEDURE TRAY) ×3 IMPLANT
WIRE AMPLATZ SS-J .035X180CM (WIRE) ×3 IMPLANT
WIRE BAREWIRE WORK .014X315CM (WIRE) ×3 IMPLANT
WIRE BENTSON .035X145CM (WIRE) ×3 IMPLANT
WIRE ROSEN-J .035X260CM (WIRE) ×3 IMPLANT

## 2017-04-05 NOTE — Progress Notes (Signed)
PROGRESS NOTE  James Dougherty  ZOX:[id number] DOB: 13-Mar-1950 DOA: 04/01/2017 PCP: Selinda Flavin, MD  Brief Narrative:   The patient is a 67 year old male with history of psoriasis, diabetes mellitus type 2, hypertension, hyperlipidemia, strokes, peripheral arterial disease who was recently diagnosed with a right lower extremity cellulitis and started on antibiotics.  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  He was started on antibiotics.  Dr. Lajoyce Corners was consulted who recommended the patient be seen by vascular surgery.  Vascular surgery performed an abdominal aortogram with bilateral lower extremity runoff on 3/11.  The performed atherectomy of the right superficial femoral artery followed by angioplasty and stent placement of the same.  Dr. Darrick Penna also placed a stent in the right external iliac artery with good results.  The patient has been started on aspirin plus Plavix which was loaded on 3/11 and which he will need to continue indefinitely.    Assessment & Plan:  Gangrene and cellulitis of the 1st toe right foot s/p stent placement in in the right superficial femoral artery and right external iliac artery.  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 -  Appreciate orthopedic and vascular surgery assistance -  Dr. early will reevaluate the patient's toe and decide what the next option would be for this. -  Blood culture x2, no growth to date -  Continue Vanc, zosyn  Chronic kidney disease stage III, patient's creatinine is near baseline of 1.4-1.7 -  Minimize nephrotoxins and renally dose medications  Hyperkalemia resolved after Kayexalate  Hypertension, blood pressure elevated - STOP Lisinopril due to hyperkalemia - d/c HCTZ due to renal insufficiency -  Continue Norvasc -  Continue  Hydralazine 10mg  iv q6h prn sbp >160  PAD, stents placed in RLE on 3/11 -  Will need to be on lifelong aspirin plus plavix Cont Simvastatin  DM2, hyperglycemic -  STOP METFORMIN Due to Creatinine 1.7 -  Continue SSI -  Add levemir 8 units QHS   DVT prophylaxis: Lovenox Code Status: Full code Family Communication: Patient alone Disposition Plan:  S/p aortogram with bilateral lower extremity run-off and stent placement in the RLE today.  Dr. Arbie Cookey to determine plan for gangrenous toe.  Will need to work with PT.    Consultants:   Dr. Lajoyce Corners, orthopedic surgery  Dr. early, vascular surgery  Procedures:  None  Antimicrobials:  Anti-infectives (From admission, onward)   Start     Dose/Rate Route Frequency Ordered Stop   04/03/17 0300  [MAR Hold]  piperacillin-tazobactam (ZOSYN) IVPB 3.375 g     (MAR Hold since 04/05/17 1105)   3.375 g 12.5 mL/hr over 240 Minutes Intravenous Every 8 hours 04/02/17 2024     04/01/17 2330  piperacillin-tazobactam (ZOSYN) IVPB 3.375 g  Status:  Discontinued     3.375 g 12.5 mL/hr over 240 Minutes Intravenous Every 8 hours 04/01/17 1843 04/02/17 2024   04/01/17 1930  [MAR Hold]  vancomycin (VANCOCIN) 1,500 mg in sodium chloride 0.9 % 500 mL IVPB     (MAR Hold since 04/05/17 1105)   1,500 mg 250 mL/hr over 120 Minutes Intravenous Every 24 hours 04/01/17 1839     04/01/17 1815  piperacillin-tazobactam (ZOSYN) IVPB 3.375 g     3.375 g 100 mL/hr over 30  Minutes Intravenous  Once 04/01/17 1812 04/01/17 1858       Subjective:  Hungry, but otherwise feels well.  Denies pain in the right toe.  Denies chest pains and SOB.  Objective: Vitals:   04/05/17 1425 04/05/17 1440 04/05/17 1455 04/05/17 1510  BP: (!) 168/45 (!) 163/49 (!) 154/47 (!) 160/52  Pulse: 69 69 64 65  Resp: 16 12 13 12   Temp:      TempSrc:      SpO2: 99% 97% 96% 98%  Weight:      Height:        Intake/Output Summary (Last 24 hours) at 04/05/2017 1648 Last data filed at  04/05/2017 0900 Gross per 24 hour  Intake 400 ml  Output -  Net 400 ml   Filed Weights   04/03/17 0510 04/04/17 0411 04/05/17 0628  Weight: 96 kg (211 lb 10.3 oz) 97.4 kg (214 lb 11.7 oz) 96.5 kg (212 lb 11.9 oz)    Examination:  General exam:  Adult male.  No acute distress.  HEENT:  NCAT, MMM Respiratory system: Clear to auscultation bilaterally Cardiovascular system: Regular rate and rhythm, normal S1/S2. No murmurs, rubs, gallops or clicks.  Warm extremities Gastrointestinal system: Normal active bowel sounds, soft, nondistended, nontender. MSK:  Normal tone and bulk, no lower extremity edema.  Right foot first toe is dry and black with discreet line of demarcation.   Neuro:  Grossly moves all extremities t   Data Reviewed: I have personally reviewed following labs and imaging studies  CBC: Recent Labs  Lab 04/01/17 1446 04/02/17 0559 04/03/17 0349 04/04/17 0549 04/05/17 0522  WBC 10.2 9.0 7.8 7.8 7.8  NEUTROABS 6.8  --   --   --   --   HGB 11.8* 11.8* 11.3* 11.9* 11.6*  HCT 36.3* 36.7* 35.1* 37.2* 36.0*  MCV 98.1 98.1 98.3 99.2 98.6  PLT 431* 429* 367 389 367   Basic Metabolic Panel: Recent Labs  Lab 04/01/17 1446 04/02/17 0559 04/03/17 0349 04/04/17 0549 04/05/17 0522  NA 134* 137 138 136 136  K 5.5* 5.3* 4.3 4.3 4.3  CL 100* 105 104 103 103  CO2 23 23 23 23 23   GLUCOSE 277* 202* 161* 183* 218*  BUN 28* 21* 20 18 20   CREATININE 1.70* 1.41* 1.40* 1.55* 1.49*  CALCIUM 9.2 9.1 8.8* 9.0 8.8*   GFR: Estimated Creatinine Clearance: 54.9 mL/min (A) (by C-G formula based on SCr of 1.49 mg/dL (H)). Liver Function Tests: Recent Labs  Lab 04/01/17 1446 04/02/17 0559  AST 20 17  ALT 19 17  ALKPHOS 71 70  BILITOT 0.8 1.1  PROT 7.0 6.4*  ALBUMIN 3.1* 2.9*   No results for input(s): LIPASE, AMYLASE in the last 168 hours. No results for input(s): AMMONIA in the last 168 hours. Coagulation Profile: Recent Labs  Lab 04/05/17 0522  INR 1.10   Cardiac  Enzymes: No results for input(s): CKTOTAL, CKMB, CKMBINDEX, TROPONINI in the last 168 hours. BNP (last 3 results) No results for input(s): PROBNP in the last 8760 hours. HbA1C: No results for input(s): HGBA1C in the last 72 hours. CBG: Recent Labs  Lab 04/04/17 1150 04/04/17 1621 04/04/17 2022 04/05/17 0631 04/05/17 1437  GLUCAP 197* 217* 283* 200* 185*   Lipid Profile: No results for input(s): CHOL, HDL, LDLCALC, TRIG, CHOLHDL, LDLDIRECT in the last 72 hours. Thyroid Function Tests: No results for input(s): TSH, T4TOTAL, FREET4, T3FREE, THYROIDAB in the last 72 hours. Anemia Panel: No results for input(s): VITAMINB12, FOLATE, FERRITIN,  TIBC, IRON, RETICCTPCT in the last 72 hours. Urine analysis:    Component Value Date/Time   COLORURINE STRAW (A) 04/02/2017 0218   APPEARANCEUR CLEAR 04/02/2017 0218   LABSPEC 1.005 04/02/2017 0218   PHURINE 6.0 04/02/2017 0218   GLUCOSEU 150 (A) 04/02/2017 0218   HGBUR NEGATIVE 04/02/2017 0218   BILIRUBINUR NEGATIVE 04/02/2017 0218   KETONESUR NEGATIVE 04/02/2017 0218   PROTEINUR NEGATIVE 04/02/2017 0218   NITRITE NEGATIVE 04/02/2017 0218   LEUKOCYTESUR NEGATIVE 04/02/2017 0218   Sepsis Labs: @LABRCNTIP (procalcitonin:4,lacticidven:4)  ) Recent Results (from the past 240 hour(s))  Culture, blood (routine x 2)     Status: None (Preliminary result)   Collection Time: 04/01/17 10:54 PM  Result Value Ref Range Status   Specimen Description BLOOD LEFT ANTECUBITAL  Final   Special Requests AEROBIC BOTTLE ONLY Blood Culture adequate volume  Final   Culture   Final    NO GROWTH 3 DAYS Performed at [facility], [address] [place], [place] [address]    Report Status PENDING  Incomplete  Culture, blood (routine x 2)     Status: None (Preliminary result)   Collection Time: 04/01/17 11:04 PM  Result Value Ref Range Status   Specimen Description BLOOD LEFT HAND  Final   Special Requests AEROBIC BOTTLE ONLY Blood Culture adequate  volume  Final   Culture   Final    NO GROWTH 3 DAYS Performed at [facility], [address], [place], [place] [address]    Report Status PENDING  Incomplete      Radiology Studies: No results found.   Scheduled Meds: . [MAR Hold] amLODipine  5 mg Oral Daily  . [MAR Hold] aspirin EC  81 mg Oral Daily  . [START ON 04/06/2017] clopidogrel  75 mg Oral Q breakfast  . [MAR Hold] enoxaparin (LOVENOX) injection  40 mg Subcutaneous Q24H  . [MAR Hold] gabapentin  600 mg Oral QPM  . [MAR Hold] insulin aspart  0-9 Units Subcutaneous TID WC  . [MAR Hold] simvastatin  20 mg Oral QHS   Continuous Infusions: . sodium chloride 75 mL/hr at 04/05/17 1021  . sodium chloride 100 mL/hr at 04/05/17 1428  . [MAR Hold] piperacillin-tazobactam (ZOSYN)  IV Stopped (04/05/17 1021)  . [MAR Hold] vancomycin Stopped (04/04/17 2202)     LOS: 4 days    Time spent: 30 min    Renae Fickle, MD [facility] Pager [phone]  If 7PM-7AM, please contact night-coverage [email or web address] Password [facility] 04/05/2017, 4:48 PM

## 2017-04-05 NOTE — Progress Notes (Signed)
Inpatient Diabetes Program Recommendations  AACE/ADA: New Consensus Statement on Inpatient Glycemic Control (2015)  Target Ranges:  Prepandial:   less than 140 mg/dL      Peak postprandial:   less than 180 mg/dL (1-2 hours)      Critically ill patients:  140 - 180 mg/dL   Results for Sonia SidePPERLY, Mitcheal F (MRN [id number]) as of 04/05/2017 12:26  Ref. Range 04/04/2017 04:10 04/04/2017 08:35 04/04/2017 11:50 04/04/2017 16:21 04/04/2017 20:22 04/05/2017 06:31  Glucose-Capillary Latest Ref Range: 65 - 99 mg/dL 409161 (H) 811302 (H) 914197 (H) 217 (H) 283 (H) 200 (H)   Review of Glycemic Control  Diabetes history: DM 2 Outpatient Diabetes medications: Onglyza 2.5 mg Daily, Glipizide 5 mg BID, Metformin 500 mg BID (MD states will discontinue at d/c) Current orders for Inpatient glycemic control: Novolog Moderate Correction 0-15 units tid + Novolog HS scale 0-5 units  A1c 8.6% on 3/10  Inpatient Diabetes Program Recommendations:    Glucose levels elevated. Consider low dose basal insulin, Lantus 8 units. May also consider decreasing Novolog Correction to sensitive Correction 0-9 units due to renal function and addition of Lantus.  Thanks,  Christena DeemShannon Dhriti Fales RN, MSN, BC-ADM, [facility] Inpatient Diabetes Coordinator Team Pager [phone] (8a-5p)

## 2017-04-05 NOTE — Progress Notes (Signed)
Pt arrived on unit from cath lab, vital signs stable, pt on bedrest until 2115, pt oriented to unit, bed in lowest position, and call bell within reach.

## 2017-04-05 NOTE — Interval H&P Note (Signed)
History and Physical Interval Note:  04/05/2017 11:33 AM  James Dougherty  has presented today for surgery, with the diagnosis of pad w/ ulcer  The various methods of treatment have been discussed with the patient and family. After consideration of risks, benefits and other options for treatment, the patient has consented to  Procedure(s): ABDOMINAL AORTOGRAM W/LOWER EXTREMITY (N/A) as a surgical intervention .  The patient's history has been reviewed, patient examined, no change in status, stable for surgery.  I have reviewed the patient's chart and labs.  Questions were answered to the patient's satisfaction.     Fabienne Brunsharles Shaheed Schmuck

## 2017-04-05 NOTE — Care Management Important Message (Signed)
Important Message  Patient Details  Name: James Dougherty MRN: [id number] Date of Birth: 1950/11/28   Medicare Important Message Given:  Yes    James Dougherty 04/05/2017, 1:05 PM

## 2017-04-05 NOTE — Progress Notes (Signed)
Site area: Left groin a 7 french arterial sheath was removed  Site Prior to Removal:  Level 0  Pressure Applied For 20 MINUTES    Bedrest Beginning at  1715p  Manual:   Yes.    Patient Status During Pull:  stable  Post Pull Groin Site:  Level 0  Post Pull Instructions Given:  Yes.    Post Pull Pulses Present:  Yes.    Dressing Applied:  Yes.    Comments:  VS remain stable

## 2017-04-05 NOTE — Op Note (Signed)
Procedure: Abdominal aortogram with bilateral lower extremity runoff.  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  80% right external iliac artery stenosis stented to 0% residual stenosis  Operative details: After obtaining informed consent, patient taken the PV lab.  The patient was placed in supine position Angio table.  Both groins were prepped and draped in usual sterile fashion.  Local anesthesia was infiltrated over the left common femoral artery.  Ultrasound was used to identify the left common femoral artery and femoral bifurcation.  An introducer needle was then used to cannulate the left common femoral artery under ultrasound guidance.  An 38 Bentson wire was then threaded up in the abdominal aorta under fluoroscopic guidance.  A 5 French sheath was placed over the guidewire in the left common femoral artery.  A 5 French pig tail catheter was placed over the guidewire in the abdominal aorta and abdominal aortogram was obtained in AP projection.  The left and right renal arteries are patent.  The distal abdominal aorta just above the aortic bifurcation has an irregular surface but no significant flow-limiting lesion.  The left and right common iliac arteries are patent.  The left and right internal iliac arteries are patent but quite small.  About 3-4 mm diameter.  The right external iliac artery has an 80% stenosis in its midportion.  The left external iliac artery is  patent.  Next the pigtail catheter was pulled down above the aortic bifurcation and bilateral oblique views of the pelvis were performed which confirmed the above findings.  At this point bilateral lower extremity runoff views were obtained through the pigtail catheter.  In the left lower extremity, the left common femoral superficial femoral and profunda femoris arteries are patent.  There is a 90% stenosis at the origin of the left profunda femoris artery.  There is a stent in the left superficial femoral artery which extends from the origin to the adductor hiatus.  This has mild in-stent restenosis but overall is widely patent.  There is two-vessel runoff to the left foot via the posterior tibial and peroneal arteries.  The popliteal artery is patent.  In the right lower extremity, the right common femoral and profunda femoris is patent.  The right superficial femoral artery is diffusely diseased with multiple segments of 70-90% stenosis popliteal artery is patent.  There is two-vessel runoff to the right foot by the peroneal and posterior tibial arteries.  At this point it was decided to intervene on the right superficial femoral artery as well as the right external iliac artery.  The pigtail catheter was removed over guidewire and exchanged for a 5 [place] crossover catheter.  An 035 angled Glidewire was then advanced through this down to the distal external iliac artery.  I then advanced a crossover catheter into the midportion of the right common iliac artery and this was exchanged for a Glidewire for an 035 Amplatz wire.  7 [place] destination sheath was then advanced up and over the aortic bifurcation and again using the Glidewire I advanced the Glidewire down into the right superficial femoral artery.  I then advanced  the sheath and dilator over this down into the right superficial femoral artery.  The patient had been given 10,000 units of intravenous heparin.  He was given an additional 5000  units of heparin during the course of the case.  ACT was maintained above 250.  At this point the 035 angled Glidewire was advanced across the right superficial femoral artery down into the distal below-knee popliteal artery.  An 035 quick cross catheter was placed across this.  The wire was then exchanged for an 014 bare wire.  A nap 6 filter was then placed over this.  This was deployed in the below-knee popliteal artery.  Jetstream atherectomy of the superficial femoral artery was then performed in usual fashion over the entire course of the superficial femoral artery.  A 5 x 250 Impact drug-eluting balloon was then inflated to nominal pressure for 3 minutes.  Completion angiogram still showed an irregular surface at the proximal portion of the SFA with multiple segments of 50-60% stenosis.  There was one small area of dissection distally.  This did not appear to be flow-limiting.  Since we artery done atherectomy and drug balloon and had not had a complete result I felt the only option at this point was to stent this segment.  A 6 x 150 self-expanding stent was deployed extending from about 6 mm into the superficial femoral artery down to the distal superficial femoral artery.  This was then postdilated with a 5 x 150 balloon.  This was inflated to nominal pressure for 1 minute.  Completion angiogram showed a widely patent right superficial femoral artery popliteal artery was patent two-vessel runoff intact to the level of foot.  At this point the sheath was pulled back into the mid right common iliac artery.  Angiogram was performed for roadmapping of the right external iliac artery.  A 7 x 40 self-expanding stent was brought up in the operative field and deployed at the level of stenosis.  It was then postdilated with a 7 balloon to nominal pressure for 1 minute.  Completion angiogram showed good apposition of the stent with no evidence of dissection and a widely patent right external iliac artery.  At  this point the sheath was pulled back over the guidewire into the left hemipelvis.  The sheath was left in place to be pulled after the ACT is 175.  The patient tired the procedure well and there were no complications.  Operative management: Dr. early will reevaluate the patient's toe and decide what the next option would be for this.  The patient now has in-line flow to his right lower extremity with two-vessel runoff via the peroneal and posterior tibial arteries.  He will be started on Plavix with a loading dose today and maintenance dose.  He will need to be on Plavix and aspirin indefinitely.  Fabienne Brunsharles Fields, MD [facility] Office: [phone] Pager: [phone]

## 2017-04-06 ENCOUNTER — Encounter (HOSPITAL_COMMUNITY): Payer: Self-pay | Admitting: Vascular Surgery

## 2017-04-06 DIAGNOSIS — Z23 Encounter for immunization: Secondary | ICD-10-CM | POA: Diagnosis not present

## 2017-04-06 LAB — BASIC METABOLIC PANEL
ANION GAP: 9 (ref 5–15)
BUN: 18 mg/dL (ref 6–20)
CALCIUM: 8.6 mg/dL — AB (ref 8.9–10.3)
CO2: 25 mmol/L (ref 22–32)
Chloride: 102 mmol/L (ref 101–111)
Creatinine, Ser: 1.51 mg/dL — ABNORMAL HIGH (ref 0.61–1.24)
GFR calc Af Amer: 54 mL/min — ABNORMAL LOW (ref >60)
GFR calc non Af Amer: 46 mL/min — ABNORMAL LOW (ref >60)
GLUCOSE: 265 mg/dL — AB (ref 65–99)
POTASSIUM: 4.7 mmol/L (ref 3.5–5.1)
Sodium: 136 mmol/L (ref 135–145)

## 2017-04-06 LAB — CBC
HCT: 34.6 % — ABNORMAL LOW (ref 39.0–52.0)
Hemoglobin: 11 g/dL — ABNORMAL LOW (ref 13.0–17.0)
MCH: 31.3 pg (ref 26.0–34.0)
MCHC: 31.8 g/dL (ref 30.0–36.0)
MCV: 98.3 fL (ref 78.0–100.0)
Platelets: 369 10*3/uL (ref 150–400)
RBC: 3.52 MIL/uL — ABNORMAL LOW (ref 4.22–5.81)
RDW: 12.8 % (ref 11.5–15.5)
WBC: 9.4 10*3/uL (ref 4.0–10.5)

## 2017-04-06 LAB — GLUCOSE, CAPILLARY
Glucose-Capillary: 184 mg/dL — ABNORMAL HIGH (ref 65–99)
Glucose-Capillary: 270 mg/dL — ABNORMAL HIGH (ref 65–99)
Glucose-Capillary: 236 mg/dL — ABNORMAL HIGH (ref 65–99)

## 2017-04-06 LAB — POCT ACTIVATED CLOTTING TIME
ACTIVATED CLOTTING TIME: 241 s
ACTIVATED CLOTTING TIME: 213 s
ACTIVATED CLOTTING TIME: 230 s
Activated Clotting Time: 268 seconds

## 2017-04-06 MED ORDER — CLOPIDOGREL BISULFATE 75 MG PO TABS: 75.0000 mg | ORAL_TABLET | Freq: Every day | ORAL | 0 refills | Status: DC

## 2017-04-06 MED ORDER — AMLODIPINE BESYLATE 5 MG PO TABS: 5.0000 mg | ORAL_TABLET | Freq: Every day | ORAL | 0 refills | Status: DC

## 2017-04-06 MED FILL — Heparin Sodium (Porcine) 2 Unit/ML in Sodium Chloride 0.9%: INTRAMUSCULAR | Qty: 1000 | Status: AC

## 2017-04-06 MED FILL — Lidocaine HCl Local Inj 1%: INTRAMUSCULAR | Qty: 20 | Status: AC

## 2017-04-06 NOTE — Progress Notes (Signed)
D/c instructions given to pt. Answered pt's many question about how to eat a heart healthy diet. Post- Op boot delivered to room and applied. Iv removed, clean and intact.   Versie StarksHanna  Kabella Cassidy, RN

## 2017-04-06 NOTE — Progress Notes (Signed)
Orthopedic Tech Progress Note Patient Details:  James SideJames F Gurney 08-Jul-1950 [id number]  Ortho Devices Type of Ortho Device: Postop shoe/boot Ortho Device/Splint Location: RLE Ortho Device/Splint Interventions: Ordered, Application   Post Interventions Patient Tolerated: Well Instructions Provided: Care of device   [name], [name] 04/06/2017, 3:28 PM

## 2017-04-06 NOTE — Discharge Summary (Signed)
Physician Discharge Summary  James Dougherty ZOX:[id number] DOB: 10-13-1950 DOA: 04/01/2017  PCP: Selinda Flavin, MD  Admit date: 04/01/2017 Discharge date: 04/06/2017  Admitted From: home  Disposition:  home  Recommendations for Outpatient Follow-up:  1. Follow up with Dr. Lajoyce Corners in 1-2 weeks regarding gangrenous toe (patient states he is not sure he can afford this appointment 2. Follow up with vascular surgery in 2 weeks regarding stents and gangrenous toe (in case patient unable to afford two specialty appointments this month) 3. PCP in 2-4 weeks regarding diabetes management  Home Health:  none  Equipment/Devices:  none  Discharge Condition:  Stable, improved CODE STATUS:  Full code  Diet recommendation:  Diabetic diet   Brief/Interim Summary:  The patient is a 67 year old male with history of psoriasis, diabetes mellitus type 2, hypertension, hyperlipidemia, strokes, peripheral arterial disease who was recently diagnosed with a right lower extremity cellulitis and started on antibiotics.  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  He was started on antibiotics.  Dr. Lajoyce Corners was consulted who recommended the patient be seen by vascular surgery.  Vascular surgery Dr. Darrick Penna performed an abdominal aortogram with bilateral lower extremity runoff on 3/11.  The performed atherectomy of the right superficial femoral artery followed by angioplasty and stent placement of the same.  Dr. Darrick Penna also placed a stent in the right external iliac artery with good results.  The patient has been started on aspirin plus Plavix which was loaded on 3/11 and which he will need to continue indefinitely.  He will follow up with vascular surgery in 2 weeks regarding his stent placement and will otherwise follow up with Dr. Lajoyce Corners  regarding his gangrenous toe in 10 days.   For his associated cellulitis, this appears to be completely resolved so I will stop antibiotics.  He had been taking clindamycin and doxycyline prior to admission and received vancomycin and zosyn during his stay so he has completed more than 7-days of therapy.    Discharge Diagnoses:  Principal Problem:   Gangrene of toe (HCC) Active Problems:   Cellulitis   Anemia   Hyperkalemia   Renal insufficiency   Type II diabetes mellitus with complication (HCC)   Dry gangrene (HCC)  Gangrene and cellulitis of the 1st toe right foot s/p stent placement in in the right superficial femoral artery and right external iliac artery on 3/11 by Dr. Darrick Penna.  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 -  Appreciate orthopedic and vascular surgery assistance -  f/u in 2 weeks with vascular surgery for routine post-stent care -  Continue aspirin and plavix indefinitely -  Blood culture x2, no growth to date -  Completed course of antibiotics in hospital  Chronic kidney disease stage III, patient's creatinine is near baseline of 1.4-1.7  Hyperkalemia resolved after Kayexalate  Hypertension, blood pressure elevated - STOPPed Lisinopril due to hyperkalemia - d/c HCTZ due to renal insufficiency -  Started Norvasc  PAD, stents placed in RLE on 3/11 -  Will need to be on lifelong aspirin plus plavix -  Cont Simvastatin  DM2, hyperglycemic.  Although it was initially reported he takes metformin 4 times daily, he states he has actually not been on this medication for a long time.   -  f/u  with PCP in 1 month for ongoing diabetes management (patient to reschedule)   Discharge Instructions  Discharge Instructions    Call MD for:  difficulty breathing, headache or visual disturbances   Complete by:  As directed    Call MD for:  persistant dizziness or light-headedness   Complete by:  As directed    Call MD for:  persistant nausea and vomiting    Complete by:  As directed    Call MD for:  severe uncontrolled pain   Complete by:  As directed    Call MD for:  temperature >100.4   Complete by:  As directed    Diet Carb Modified   Complete by:  As directed    Increase activity slowly   Complete by:  As directed      Allergies as of 04/06/2017      Reactions   Oxycodone Nausea And Vomiting   Glipizide    Bad headache and blurred vision   Bactrim [sulfamethoxazole-trimethoprim] Itching, Rash      Medication List    STOP taking these medications   clindamycin 300 MG capsule Commonly known as:  CLEOCIN   doxycycline 100 MG EC tablet Commonly known as:  DORYX   lisinopril-hydrochlorothiazide 20-25 MG tablet Commonly known as:  PRINZIDE,ZESTORETIC   metFORMIN 500 MG tablet Commonly known as:  GLUCOPHAGE     TAKE these medications   amLODipine 5 MG tablet Commonly known as:  NORVASC Take 1 tablet (5 mg total) by mouth daily. Start taking on:  04/07/2017   aspirin EC 81 MG tablet Take 81 mg by mouth daily.   clopidogrel 75 MG tablet Commonly known as:  PLAVIX Take 1 tablet (75 mg total) by mouth daily with breakfast. Start taking on:  04/07/2017   fluticasone 50 MCG/ACT nasal spray Commonly known as:  FLONASE Place 2 sprays into both nostrils daily as needed for allergies or rhinitis.   gabapentin 600 MG tablet Commonly known as:  NEURONTIN Take 600 mg by mouth every evening.   GOLD BOND FOOT Crea Apply 1 application topically 4 (four) times daily.   simvastatin 20 MG tablet Commonly known as:  ZOCOR Take 20 mg by mouth at bedtime.      Follow-up Information    Selinda FlavinHoward, Kevin, MD Follow up.   Specialty:  Family Medicine Contact information: [address] [place] [address] [phone]        Nadara Mustarduda, Marcus V, MD. Schedule an appointment as soon as possible for a visit in 10 day(s).   Specialty:  Orthopedic Surgery Contact information: [address] [place]  [address] [phone]        Nada LibmanBrabham, Vance W, MD. Schedule an appointment as soon as possible for a visit in 2 week(s).   Specialties:  Vascular Surgery, Cardiology Contact information: [address] [place] [address] [phone]          Allergies  Allergen Reactions  . Oxycodone Nausea And Vomiting  . Glipizide     Bad headache and blurred vision  . Bactrim [Sulfamethoxazole-Trimethoprim] Itching and Rash    Consultations: Dr. Lajoyce Cornersuda, Orthopedic surgery Drs. Fields and Early, Vascular surgery    Procedures/Studies: [place]as Renal  Result Date: 04/02/2017 CLINICAL DATA:  Renal insufficiency EXAM: RENAL / URINARY TRACT ULTRASOUND COMPLETE COMPARISON:  None. FINDINGS: Right Kidney: Length: 10.3 cm. Echogenicity within normal limits. No mass or hydronephrosis visualized. Left Kidney: Length: 11.6 cm. 2 cm left upper pole cyst. Echogenicity within normal limits. No mass  or hydronephrosis visualized. Bladder: Appears normal for degree of bladder distention. Cholelithiasis IMPRESSION: No significant renal abnormality. Cholelithiasis Electronically Signed   By: Marlan Palau M.D.   On: 04/02/2017 07:36   Dg Foot Complete Right  Result Date: 04/01/2017 CLINICAL DATA:  Acute RIGHT foot pain following injury last week. EXAM: RIGHT FOOT COMPLETE - 3+ VIEW COMPARISON:  None. FINDINGS: There is no evidence of acute fracture, subluxation or dislocation. No evidence of acute osteomyelitis. No soft tissue abnormalities are present. Vascular calcifications are present. IMPRESSION: No evidence of acute abnormality. Electronically Signed   By: Harmon Pier M.D.   On: 04/01/2017 15:16    Subjective: Denies pain in the right foot.  Ambulating okay.  Denies fevers, chills, chest pain, difficulty breathing.  Discharge Exam: Vitals:   04/06/17 0622 04/06/17 0814  BP:  (!) 114/99  Pulse:  96  Resp: 14 15  Temp:  (!) 97.4 F (36.3 C)  SpO2:  93%   Vitals:   04/05/17 2000 04/06/17 0400 04/06/17  0622 04/06/17 0814  BP: (!) 158/60 (!) 124/48  (!) 114/99  Pulse: 91 80  96  Resp: 18 (!) 36 14 15  Temp:  (!) 97.5 F (36.4 C)  (!) 97.4 F (36.3 C)  TempSrc:  Oral  Oral  SpO2: 99% 97%  93%  Weight:   98.1 kg (216 lb 3.2 oz)   Height:        General: Pt is alert, awake, not in acute distress Cardiovascular: RRR, S1/S2 +, no rubs, no gallops Respiratory: CTA bilaterally, no wheezing, no rhonchi Abdominal: Soft, NT, ND, bowel sounds + Extremities: trace edema of the RLE.  Absent pedal pulse            The results of significant diagnostics from this hospitalization (including imaging, microbiology, ancillary and laboratory) are listed below for reference.     Microbiology: Recent Results (from the past 240 hour(s))  Culture, blood (routine x 2)     Status: None (Preliminary result)   Collection Time: 04/01/17 10:54 PM  Result Value Ref Range Status   Specimen Description BLOOD LEFT ANTECUBITAL  Final   Special Requests AEROBIC BOTTLE ONLY Blood Culture adequate volume  Final   Culture   Final    NO GROWTH 4 DAYS Performed at [facility], [address], [place], [place] [address]    Report Status PENDING  Incomplete  Culture, blood (routine x 2)     Status: None (Preliminary result)   Collection Time: 04/01/17 11:04 PM  Result Value Ref Range Status   Specimen Description BLOOD LEFT HAND  Final   Special Requests AEROBIC BOTTLE ONLY Blood Culture adequate volume  Final   Culture   Final    NO GROWTH 4 DAYS Performed at [facility], [address] [place], [place] [address]    Report Status PENDING  Incomplete     Labs: BNP (last 3 results) No results for input(s): BNP in the last 8760 hours. Basic Metabolic Panel: Recent Labs  Lab 04/02/17 0559 04/03/17 0349 04/04/17 0549 04/05/17 0522 04/06/17 0230  NA 137 138 136 136 136  K 5.3* 4.3 4.3 4.3 4.7  CL 105 104 103 103 102  CO2 23 23 23 23 25   GLUCOSE 202* 161* 183* 218* 265*  BUN  21* 20 18 20 18   CREATININE 1.41* 1.40* 1.55* 1.49* 1.51*  CALCIUM 9.1 8.8* 9.0 8.8* 8.6*   Liver Function Tests: Recent Labs  Lab 04/01/17 1446  04/02/17 0559  AST 20 17  ALT 19 17  ALKPHOS 71 70  BILITOT 0.8 1.1  PROT 7.0 6.4*  ALBUMIN 3.1* 2.9*   No results for input(s): LIPASE, AMYLASE in the last 168 hours. No results for input(s): AMMONIA in the last 168 hours. CBC: Recent Labs  Lab 04/01/17 1446 04/02/17 0559 04/03/17 0349 04/04/17 0549 04/05/17 0522 04/06/17 0230  WBC 10.2 9.0 7.8 7.8 7.8 9.4  NEUTROABS 6.8  --   --   --   --   --   HGB 11.8* 11.8* 11.3* 11.9* 11.6* 11.0*  HCT 36.3* 36.7* 35.1* 37.2* 36.0* 34.6*  MCV 98.1 98.1 98.3 99.2 98.6 98.3  PLT 431* 429* 367 389 367 369   Cardiac Enzymes: No results for input(s): CKTOTAL, CKMB, CKMBINDEX, TROPONINI in the last 168 hours. BNP: Invalid input(s): POCBNP CBG: Recent Labs  Lab 04/05/17 1437 04/05/17 1746 04/05/17 2018 04/06/17 0613 04/06/17 1117  GLUCAP 185* 165* 149* 184* 270*   D-Dimer No results for input(s): DDIMER in the last 72 hours. Hgb A1c No results for input(s): HGBA1C in the last 72 hours. Lipid Profile No results for input(s): CHOL, HDL, LDLCALC, TRIG, CHOLHDL, LDLDIRECT in the last 72 hours. Thyroid function studies No results for input(s): TSH, T4TOTAL, T3FREE, THYROIDAB in the last 72 hours.  Invalid input(s): FREET3 Anemia work up No results for input(s): VITAMINB12, FOLATE, FERRITIN, TIBC, IRON, RETICCTPCT in the last 72 hours. Urinalysis    Component Value Date/Time   COLORURINE STRAW (A) 04/02/2017 0218   APPEARANCEUR CLEAR 04/02/2017 0218   LABSPEC 1.005 04/02/2017 0218   PHURINE 6.0 04/02/2017 0218   GLUCOSEU 150 (A) 04/02/2017 0218   HGBUR NEGATIVE 04/02/2017 0218   BILIRUBINUR NEGATIVE 04/02/2017 0218   KETONESUR NEGATIVE 04/02/2017 0218   PROTEINUR NEGATIVE 04/02/2017 0218   NITRITE NEGATIVE 04/02/2017 0218   LEUKOCYTESUR NEGATIVE 04/02/2017 0218   Sepsis  Labs Invalid input(s): PROCALCITONIN,  WBC,  LACTICIDVEN   Time coordinating discharge: Over 30 minutes  SIGNED:   Renae Fickle, MD  [facility] 04/06/2017, 3:03 PM Pager   If 7PM-7AM, please contact night-coverage [email or web address] Password [email or web address]

## 2017-04-06 NOTE — Progress Notes (Signed)
Patient ID: James Dougherty, male   DOB: 1950/06/14, 67 y.o.   MRN: [id number] Successful endovascular treatment of occlusive disease.  We will not follow actively.  Dr. Lajoyce Corners involved regarding definitive treatment of gangrene of his great toe.  Will follow up in the office.

## 2017-04-07 LAB — CULTURE, BLOOD (ROUTINE X 2)
Culture: NO GROWTH
Culture: NO GROWTH
SPECIAL REQUESTS: ADEQUATE
Special Requests: ADEQUATE

## 2017-04-08 ENCOUNTER — Telehealth: Payer: Self-pay | Admitting: *Deleted

## 2017-04-08 ENCOUNTER — Telehealth (INDEPENDENT_AMBULATORY_CARE_PROVIDER_SITE_OTHER): Payer: Self-pay

## 2017-04-08 NOTE — Telephone Encounter (Signed)
[facility] to his foot actively bleeding and oozing.  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  No further EDCM needs identified at this time.

## 2017-04-08 NOTE — Telephone Encounter (Signed)
I called pt and he states that he had stent placement with Dr. Darrick PennaFields and that he is having post surgical drainage and needs to have home health come out and change his dressing. The pt is wanting Dr. Lajoyce Cornersuda to write the order for his dressings and [facility]. I advised the pt he should e calling [facility] who did his procedure to make them aware of the post procedure complications and that they will have to [facility] dressing changes. I advised that we are happy to see him in the office but that we can not order care on something without seeing him in the office first. Dr. Lajoyce Cornersuda had been called in consult for a gangrenous foot and Dr. Lajoyce Cornersuda called vascular consult and they did the surgery. Again I advised the pt we are happy to se him but he states that he already has an appt with [facility] on 04/22/17 and did nt need to see two doctors. I gave the number to [facility] so that he can contact the office and make them aware of the complications that he is having and to advise of future treatment.

## 2017-04-08 NOTE — Telephone Encounter (Signed)
Lady calling for pt stating pts wound is bleeding and oozing. Pt needs a call back to see if we can see him asap.

## 2017-04-09 DIAGNOSIS — E1165 Type 2 diabetes mellitus with hyperglycemia: Secondary | ICD-10-CM | POA: Diagnosis not present

## 2017-04-09 DIAGNOSIS — I10 Essential (primary) hypertension: Secondary | ICD-10-CM | POA: Diagnosis not present

## 2017-04-09 DIAGNOSIS — E1129 Type 2 diabetes mellitus with other diabetic kidney complication: Secondary | ICD-10-CM | POA: Diagnosis not present

## 2017-04-09 DIAGNOSIS — E7800 Pure hypercholesterolemia, unspecified: Secondary | ICD-10-CM | POA: Diagnosis not present

## 2017-04-09 NOTE — Consult Note (Signed)
            [facility] CM Primary Care Navigator  04/09/2017  Sonia SideJames F Cullars [birth year] [id number]   Attemptto seepatient at the bedside to identify possible discharge needs buthe was already discharged home.  Per chart review, patient was admitted with worsening black discoloration of the first toe on his right foot (was found to have a dry gangrene- status post abdominal aortogram with bilateral lower extremity run off on 3/11).  Primary care provider's office is listed as doing transition of care (TOC). Primary care provider's office called Toni Amend(Courtney) to notify of patient's discharge and informed of health issues needing follow-up (mainly DM).  Made aware of necessity to refer [facility] as deemed necessary and appropriate for any services needed.   For additional questions please contact:  Karin GoldenLorraine A. Carolyn Sylvia, BSN, RN-BC [facility] PRIMARY CARE Navigator Cell: [phone]

## 2017-04-13 DIAGNOSIS — I10 Essential (primary) hypertension: Secondary | ICD-10-CM | POA: Diagnosis not present

## 2017-04-13 DIAGNOSIS — E7800 Pure hypercholesterolemia, unspecified: Secondary | ICD-10-CM | POA: Diagnosis not present

## 2017-04-13 DIAGNOSIS — I739 Peripheral vascular disease, unspecified: Secondary | ICD-10-CM | POA: Diagnosis not present

## 2017-04-13 DIAGNOSIS — L409 Psoriasis, unspecified: Secondary | ICD-10-CM | POA: Diagnosis not present

## 2017-04-13 DIAGNOSIS — G6289 Other specified polyneuropathies: Secondary | ICD-10-CM | POA: Diagnosis not present

## 2017-04-20 ENCOUNTER — Encounter: Payer: Self-pay | Admitting: Family

## 2017-04-20 ENCOUNTER — Other Ambulatory Visit: Payer: Self-pay | Admitting: *Deleted

## 2017-04-20 ENCOUNTER — Ambulatory Visit (INDEPENDENT_AMBULATORY_CARE_PROVIDER_SITE_OTHER): Payer: Medicare Other | Admitting: Family

## 2017-04-20 ENCOUNTER — Other Ambulatory Visit: Payer: Self-pay

## 2017-04-20 ENCOUNTER — Encounter: Payer: Self-pay | Admitting: *Deleted

## 2017-04-20 VITALS — BP 138/66 | HR 94 | Temp 98.3°F | Resp 20 | Ht 68.0 in | Wt 218.0 lb

## 2017-04-20 DIAGNOSIS — Z959 Presence of cardiac and vascular implant and graft, unspecified: Secondary | ICD-10-CM

## 2017-04-20 DIAGNOSIS — I6523 Occlusion and stenosis of bilateral carotid arteries: Secondary | ICD-10-CM

## 2017-04-20 DIAGNOSIS — E1151 Type 2 diabetes mellitus with diabetic peripheral angiopathy without gangrene: Secondary | ICD-10-CM

## 2017-04-20 DIAGNOSIS — I96 Gangrene, not elsewhere classified: Secondary | ICD-10-CM

## 2017-04-20 MED ORDER — CEPHALEXIN 500 MG PO CAPS: 500.0000 mg | ORAL_CAPSULE | Freq: Three times a day (TID) | ORAL | 0 refills | Status: DC

## 2017-04-20 NOTE — Progress Notes (Signed)
[facility] [place]   CC: gangrene of right great toe, hx of peripheral artery occlusive disease  History of Present Illness James Dougherty is a 67 y.o. male status post atherectomy right superficial femoral artery, drug-coated balloon angioplasty right superficial femoral artery, stent right superficial femoral artery (6 x 150 self-expanding), stent right external iliac artery (7 x 40) self-expanding on 04-05-17 by Dr. Darrick PennaFields for nonhealing wound right foot.   Dr. Darrick PennaFields operative note mentions: Operative management: Dr. Arbie CookeyEarly will reevaluate the patient's toe and decide what the next option would be for this.  The patient now has in-line flow to his right lower extremity with two-vessel runoff via the peroneal and posterior tibial arteries.  He will be started on Plavix with a loading dose today and maintenance dose.  He will need to be on Plavix and aspirin indefinitely.  He returns today as he was instructed to return. He states rare pain in right foot.  He had chills yesterday, denies feeling hot.  He is allergic to bactrim and oxycodone.  He is not on any antibx now.   His next follow up is scheduled In July 2019 for carotid and LE duplex, ABI's.    He is also left superficial femoral and popliteal artery recanalization and subsequent stenting on 03/30/2012 by Dr. Myra GianottiBrabham. This was done for ulcers of his left lateral lower leg which has healed, he was seen in the wound clinic. On 12/27/2012 he had angioplasty of in-stent stenosis.  The patient had a stroke in 1999, denies any subsequent stroke or TIA activity. He had difficulties with aphasia and left-sided weakness which have resolved. He has had hearing loss since childhood.   He has generalized psoriasis lesions that wax and wane.   Last serum creatinine was 1.51 on 04-06-17.   He is seeing a podiatrist to trim his toenails.  Pt Diabetic: Yes, states his last A1C was 8.6 in October 2018,  uncontrolled; pt states the next step for medications is Trulicity and insulin for better control Pt smoker: former smoker, quit in 2012  Pt meds include: Statin :Yes ASA: Yes Other anticoagulants/antiplatelets: no    Past Medical History:  Diagnosis Date  . Arthritis    "hands" (12/27/2012)  . High cholesterol   . Hypertension   . Neuropathic pain   . PAD (peripheral artery disease) (HCC)   . Psoriasis   . Stroke [facility](HCC) 1999   "still have some speech problems at times; sometimes forget what I was going to say" (12/27/2012)  . Type II diabetes mellitus (HCC)   . Ulcer    Left ankle/leg    Social History Social History   Tobacco Use  . Smoking status: Former Smoker    Packs/day: 3.00    Years: 45.00    Pack years: 135.00    Types: Cigarettes    Last attempt to quit: 01/07/2011    Years since quitting: 6.2  . Smokeless tobacco: Never Used  Substance Use Topics  . Alcohol use: Yes    Comment: 12/27/2012 "couple times/yr I might have a drink"  . Drug use: No    Family History Family History  Problem Relation Age of Onset  . Diabetes Sister        Bilateral amputation of lower legs  . Diabetes Sister   . Heart disease Sister 1157       Heart disease before age 67  . Hypertension Sister   . Heart attack Sister   . Hyperlipidemia  Sister   . Hypertension Father   . Hyperlipidemia Father   . Hyperlipidemia Mother   . Hypertension Mother   . Hypertension Daughter     Past Surgical History:  Procedure Laterality Date  . ABDOMINAL AORTAGRAM N/A 03/30/2012   Procedure: ABDOMINAL Ronny Flurry;  Surgeon: Nada Libman, MD;  Location: [facility] CATH LAB;  Service: Cardiovascular;  Laterality: N/A;  . ABDOMINAL AORTAGRAM N/A 12/27/2012   Procedure: ABDOMINAL Ronny Flurry;  Surgeon: Nada Libman, MD;  Location: [facility] CATH LAB;  Service: Cardiovascular;  Laterality: N/A;  . ABDOMINAL AORTOGRAM W/LOWER EXTREMITY N/A 04/05/2017   Procedure: ABDOMINAL AORTOGRAM W/LOWER EXTREMITY;  Surgeon:  Sherren Kerns, MD;  Location: [facility] INVASIVE CV LAB;  Service: Cardiovascular;  Laterality: N/A;  . ANGIOPLASTY / STENTING FEMORAL Left 12/27/2012  . FEMORAL ARTERY STENT  03/30/2012  . LOWER EXTREMITY ANGIOGRAM  12/27/2012   Procedure: LOWER EXTREMITY ANGIOGRAM;  Surgeon: Nada Libman, MD;  Location: [facility] CATH LAB;  Service: Cardiovascular;;  . PERIPHERAL VASCULAR ATHERECTOMY Right 04/05/2017   Procedure: PERIPHERAL VASCULAR ATHERECTOMY;  Surgeon: Sherren Kerns, MD;  Location: [facility] INVASIVE CV LAB;  Service: Cardiovascular;  Laterality: Right;  superficial femoral  . PERIPHERAL VASCULAR INTERVENTION Right 04/05/2017   Procedure: PERIPHERAL VASCULAR INTERVENTION;  Surgeon: Sherren Kerns, MD;  Location: [facility] INVASIVE CV LAB;  Service: Cardiovascular;  Laterality: Right;   Superficial femorl and external iliac  . POPLITEAL ARTERY STENT    . TONSILLECTOMY AND ADENOIDECTOMY      Allergies  Allergen Reactions  . Oxycodone Nausea And Vomiting  . Glipizide     Bad headache and blurred vision  . Bactrim [Sulfamethoxazole-Trimethoprim] Itching and Rash    Current Outpatient Medications  Medication Sig Dispense Refill  . amLODipine (NORVASC) 5 MG tablet Take 1 tablet (5 mg total) by mouth daily. 30 tablet 0  . aspirin EC 81 MG tablet Take 81 mg by mouth daily.    . clopidogrel (PLAVIX) 75 MG tablet Take 1 tablet (75 mg total) by mouth daily with breakfast. 30 tablet 0  . fluticasone (FLONASE) 50 MCG/ACT nasal spray Place 2 sprays into both nostrils daily as needed for allergies or rhinitis.    [name] gabapentin (NEURONTIN) 600 MG tablet Take 600 mg by mouth every evening.     [name] NOVOLIN 70/30 RELION (70-30) 100 UNIT/ML injection   12  . Podiatric Products (GOLD BOND FOOT) CREA Apply 1 application topically 4 (four) times daily.    . simvastatin (ZOCOR) 20 MG tablet Take 20 mg by mouth at bedtime.     [name] doxycycline (ADOXA) 100 MG tablet      No current facility-administered medications for this  visit.     ROS: See HPI for pertinent positives and negatives.   Physical Examination  Vitals:   04/20/17 1409 04/20/17 1413  BP: (!) 149/72 138/66  Pulse: 95 94  Resp: 20   Temp: 98.3 F (36.8 C)   TempSrc: Oral   SpO2: 98%   Weight: 218 lb (98.9 kg)   Height: 5\' 8"  (1.727 m)    Body mass index is 33.15 kg/m.  General: A&O x 3, WDWN, obese male. Gait: normal Eyes: PERRLA. Pulmonary: Respirations are non labored, CTAB, without wheezes, rales, or rhonchi. Cardiac: regularrhythm, no detected murmur, + left carotid bruit.     Abdominal aortic pulse is not palpable. Radial pulses: are 1+ palpable and =   Right foot    Right foot    VASCULAR EXAM: Extremities with gangrene  of right great toe, see above photos.      LE Pulses LEFT RIGHT   FEMORAL faintly palpable Faintly palpable     POPLITEAL not palpable  not palpable   POSTERIOR TIBIAL not palpable  not palpable, + Doppler signal    DORSALIS PEDIS  ANTERIOR TIBIAL not palpable  not palpable, + Doppler signal    Abdomen: soft, NT, no palptated masses. Skin: no rashes, no ulcers. See extremities. Musculoskeletal: no muscle wasting or atrophy, see Extremities. Neurologic: A&O X 3; appropriate affect, MOTOR FUNCTION: moving all extremities equally, motor strength 5/5 throughout. Speech is fluent/normal. CN 2-12 intact except for hard of hearing Psychiatric: Thought content is normal, mood appropriate for clinical situation.     ASSESSMENT: James Dougherty is a 67 y.o. male who presents with gangrene of right great toe.  He is s/p atherectomy right superficial femoral artery, drug-coated balloon angioplasty right superficial femoral artery, stent right  superficial femoral artery (6 x 150 self-expanding), stent right external iliac artery (7 x 40) self-expanding on 04-05-17 by Dr. Darrick Penna for nonhealing wound right foot.  He is also left superficial femoral and popliteal artery recanalization and subsequent stenting on 03/30/2012 by Dr. Myra Gianotti. This was done for ulcers of his left lateral lower leg which has healed, he was seen in the wound clinic. On 12/27/2012 he had angioplasty of in-stent stenosis.  He had chills recently but did not feel feverish.  Dr. Arbie Cookey spoke with and examined pt.  See Plan.    PLAN:  Based on the patient's HPI and examination, pt will be scheduled for admission to [facility] next week, will plan amputation right great toe. He lives alone.  Keflex 500 mg po tid x 7 days, disp #21, 0 refills sent to his pharmacy.   I discussed in depth with the patient the nature of atherosclerosis, and emphasized the importance of maximal medical management including strict control of blood pressure, blood glucose, and lipid levels, obtaining regular exercise, and continued cessation of smoking.  The patient is aware that without maximal medical management the underlying atherosclerotic disease process will progress, limiting the benefit of any interventions.   Charisse March, RN, MSN, FNP-C [facility] [place] Phone: [phone]  Clinic MD: Early  04/20/17 2:32 PM

## 2017-04-20 NOTE — Progress Notes (Signed)
Vitals:   04/20/17 1409  BP: (!) 149/72  Pulse: 95  Resp: 20  Temp: 98.3 F (36.8 C)  TempSrc: Oral  SpO2: 98%  Weight: 218 lb (98.9 kg)  Height: 5\' 8"  (1.727 m)

## 2017-04-20 NOTE — H&P (View-Only) (Signed)
[facility] [place]   CC: gangrene of right great toe, hx of peripheral artery occlusive disease  History of Present Illness James Dougherty is a 67 y.o. male status post atherectomy right superficial femoral artery, drug-coated balloon angioplasty right superficial femoral artery, stent right superficial femoral artery (6 x 150 self-expanding), stent right external iliac artery (7 x 40) self-expanding on 04-05-17 by Dr. Darrick PennaFields for nonhealing wound right foot.   Dr. Darrick PennaFields operative note mentions: Operative management: Dr. Arbie CookeyEarly will reevaluate the patient's toe and decide what the next option would be for this.  The patient now has in-line flow to his right lower extremity with two-vessel runoff via the peroneal and posterior tibial arteries.  He will be started on Plavix with a loading dose today and maintenance dose.  He will need to be on Plavix and aspirin indefinitely.  He returns today as he was instructed to return. He states rare pain in right foot.  He had chills yesterday, denies feeling hot.  He is allergic to bactrim and oxycodone.  He is not on any antibx now.   His next follow up is scheduled In July 2019 for carotid and LE duplex, ABI's.    He is also left superficial femoral and popliteal artery recanalization and subsequent stenting on 03/30/2012 by Dr. Myra GianottiBrabham. This was done for ulcers of his left lateral lower leg which has healed, he was seen in the wound clinic. On 12/27/2012 he had angioplasty of in-stent stenosis.  The patient had a stroke in 1999, denies any subsequent stroke or TIA activity. He had difficulties with aphasia and left-sided weakness which have resolved. He has had hearing loss since childhood.   He has generalized psoriasis lesions that wax and wane.   Last serum creatinine was 1.51 on 04-06-17.   He is seeing a podiatrist to trim his toenails.  Pt Diabetic: Yes, states his last A1C was 8.6 in October 2018,  uncontrolled; pt states the next step for medications is Trulicity and insulin for better control Pt smoker: former smoker, quit in 2012  Pt meds include: Statin :Yes ASA: Yes Other anticoagulants/antiplatelets: no    Past Medical History:  Diagnosis Date  . Arthritis    "hands" (12/27/2012)  . High cholesterol   . Hypertension   . Neuropathic pain   . PAD (peripheral artery disease) (HCC)   . Psoriasis   . Stroke [facility](HCC) 1999   "still have some speech problems at times; sometimes forget what I was going to say" (12/27/2012)  . Type II diabetes mellitus (HCC)   . Ulcer    Left ankle/leg    Social History Social History   Tobacco Use  . Smoking status: Former Smoker    Packs/day: 3.00    Years: 45.00    Pack years: 135.00    Types: Cigarettes    Last attempt to quit: 01/07/2011    Years since quitting: 6.2  . Smokeless tobacco: Never Used  Substance Use Topics  . Alcohol use: Yes    Comment: 12/27/2012 "couple times/yr I might have a drink"  . Drug use: No    Family History Family History  Problem Relation Age of Onset  . Diabetes Sister        Bilateral amputation of lower legs  . Diabetes Sister   . Heart disease Sister 1157       Heart disease before age 67  . Hypertension Sister   . Heart attack Sister   . Hyperlipidemia  Sister   . Hypertension Father   . Hyperlipidemia Father   . Hyperlipidemia Mother   . Hypertension Mother   . Hypertension Daughter     Past Surgical History:  Procedure Laterality Date  . ABDOMINAL AORTAGRAM N/A 03/30/2012   Procedure: ABDOMINAL Ronny Flurry;  Surgeon: Nada Libman, MD;  Location: [facility] CATH LAB;  Service: Cardiovascular;  Laterality: N/A;  . ABDOMINAL AORTAGRAM N/A 12/27/2012   Procedure: ABDOMINAL Ronny Flurry;  Surgeon: Nada Libman, MD;  Location: [facility] CATH LAB;  Service: Cardiovascular;  Laterality: N/A;  . ABDOMINAL AORTOGRAM W/LOWER EXTREMITY N/A 04/05/2017   Procedure: ABDOMINAL AORTOGRAM W/LOWER EXTREMITY;  Surgeon:  Sherren Kerns, MD;  Location: [facility] INVASIVE CV LAB;  Service: Cardiovascular;  Laterality: N/A;  . ANGIOPLASTY / STENTING FEMORAL Left 12/27/2012  . FEMORAL ARTERY STENT  03/30/2012  . LOWER EXTREMITY ANGIOGRAM  12/27/2012   Procedure: LOWER EXTREMITY ANGIOGRAM;  Surgeon: Nada Libman, MD;  Location: [facility] CATH LAB;  Service: Cardiovascular;;  . PERIPHERAL VASCULAR ATHERECTOMY Right 04/05/2017   Procedure: PERIPHERAL VASCULAR ATHERECTOMY;  Surgeon: Sherren Kerns, MD;  Location: [facility] INVASIVE CV LAB;  Service: Cardiovascular;  Laterality: Right;  superficial femoral  . PERIPHERAL VASCULAR INTERVENTION Right 04/05/2017   Procedure: PERIPHERAL VASCULAR INTERVENTION;  Surgeon: Sherren Kerns, MD;  Location: [facility] INVASIVE CV LAB;  Service: Cardiovascular;  Laterality: Right;   Superficial femorl and external iliac  . POPLITEAL ARTERY STENT    . TONSILLECTOMY AND ADENOIDECTOMY      Allergies  Allergen Reactions  . Oxycodone Nausea And Vomiting  . Glipizide     Bad headache and blurred vision  . Bactrim [Sulfamethoxazole-Trimethoprim] Itching and Rash    Current Outpatient Medications  Medication Sig Dispense Refill  . amLODipine (NORVASC) 5 MG tablet Take 1 tablet (5 mg total) by mouth daily. 30 tablet 0  . aspirin EC 81 MG tablet Take 81 mg by mouth daily.    . clopidogrel (PLAVIX) 75 MG tablet Take 1 tablet (75 mg total) by mouth daily with breakfast. 30 tablet 0  . fluticasone (FLONASE) 50 MCG/ACT nasal spray Place 2 sprays into both nostrils daily as needed for allergies or rhinitis.    [name] gabapentin (NEURONTIN) 600 MG tablet Take 600 mg by mouth every evening.     [name] NOVOLIN 70/30 RELION (70-30) 100 UNIT/ML injection   12  . Podiatric Products (GOLD BOND FOOT) CREA Apply 1 application topically 4 (four) times daily.    . simvastatin (ZOCOR) 20 MG tablet Take 20 mg by mouth at bedtime.     [name] doxycycline (ADOXA) 100 MG tablet      No current facility-administered medications for this  visit.     ROS: See HPI for pertinent positives and negatives.   Physical Examination  Vitals:   04/20/17 1409 04/20/17 1413  BP: (!) 149/72 138/66  Pulse: 95 94  Resp: 20   Temp: 98.3 F (36.8 C)   TempSrc: Oral   SpO2: 98%   Weight: 218 lb (98.9 kg)   Height: 5\' 8"  (1.727 m)    Body mass index is 33.15 kg/m.  General: A&O x 3, WDWN, obese male. Gait: normal Eyes: PERRLA. Pulmonary: Respirations are non labored, CTAB, without wheezes, rales, or rhonchi. Cardiac: regularrhythm, no detected murmur, + left carotid bruit.     Abdominal aortic pulse is not palpable. Radial pulses: are 1+ palpable and =   Right foot    Right foot    VASCULAR EXAM: Extremities with gangrene  of right great toe, see above photos.      LE Pulses LEFT RIGHT   FEMORAL faintly palpable Faintly palpable     POPLITEAL not palpable  not palpable   POSTERIOR TIBIAL not palpable  not palpable, + Doppler signal    DORSALIS PEDIS  ANTERIOR TIBIAL not palpable  not palpable, + Doppler signal    Abdomen: soft, NT, no palptated masses. Skin: no rashes, no ulcers. See extremities. Musculoskeletal: no muscle wasting or atrophy, see Extremities. Neurologic: A&O X 3; appropriate affect, MOTOR FUNCTION: moving all extremities equally, motor strength 5/5 throughout. Speech is fluent/normal. CN 2-12 intact except for hard of hearing Psychiatric: Thought content is normal, mood appropriate for clinical situation.     ASSESSMENT: James Dougherty is a 67 y.o. male who presents with gangrene of right great toe.  He is s/p atherectomy right superficial femoral artery, drug-coated balloon angioplasty right superficial femoral artery, stent right  superficial femoral artery (6 x 150 self-expanding), stent right external iliac artery (7 x 40) self-expanding on 04-05-17 by Dr. Darrick Penna for nonhealing wound right foot.  He is also left superficial femoral and popliteal artery recanalization and subsequent stenting on 03/30/2012 by Dr. Myra Gianotti. This was done for ulcers of his left lateral lower leg which has healed, he was seen in the wound clinic. On 12/27/2012 he had angioplasty of in-stent stenosis.  He had chills recently but did not feel feverish.  Dr. Arbie Cookey spoke with and examined pt.  See Plan.    PLAN:  Based on the patient's HPI and examination, pt will be scheduled for admission to [facility] next week, will plan amputation right great toe. He lives alone.  Keflex 500 mg po tid x 7 days, disp #21, 0 refills sent to his pharmacy.   I discussed in depth with the patient the nature of atherosclerosis, and emphasized the importance of maximal medical management including strict control of blood pressure, blood glucose, and lipid levels, obtaining regular exercise, and continued cessation of smoking.  The patient is aware that without maximal medical management the underlying atherosclerotic disease process will progress, limiting the benefit of any interventions.   Charisse March, RN, MSN, FNP-C [facility] Phone: [phone]  Clinic MD: Early  04/20/17 2:32 PM

## 2017-04-21 DIAGNOSIS — E1129 Type 2 diabetes mellitus with other diabetic kidney complication: Secondary | ICD-10-CM | POA: Diagnosis not present

## 2017-04-22 ENCOUNTER — Encounter (HOSPITAL_COMMUNITY): Payer: Medicare Other

## 2017-04-23 ENCOUNTER — Other Ambulatory Visit: Payer: Self-pay

## 2017-04-23 ENCOUNTER — Encounter (HOSPITAL_COMMUNITY): Payer: Self-pay | Admitting: *Deleted

## 2017-04-23 NOTE — Progress Notes (Signed)
Spoke with pt for pre-op call. Pt denies cardiac history. States he had a stroke several years ago, denies any lasting side effects. Pt is on Plavix. There was a note from Dr. Bosie HelperEarly's office stating pt was to hold Plavix for 5 days and continue Aspirin. When I asked pt when he stopped it, he stated he had not and didn't remember being told to stop it. I asked him if he had taken it today and he stated he did just before he called me back. I instructed him not to take it this weekend prior to surgery. Pt is a type 2 diabetic. States last A1C was 10.5 two weeks ago. States his PCP adjusted his medication and added Trulicity once a week. He states his fasting blood sugars are between 170-180. Pt was instructed to take 70% of his Novolin 70/30 Insulin Sunday evening (will take 7 units) and not to take any Insulin the morning of surgery. Instructed pt to check his blood sugar when he gets up Monday AM and every 2 hours until he leaves for surgery. If blood sugar is 70 or below, treat with 1/2 cup of clear juice (apple or cranberry) and recheck blood sugar 15 minutes after drinking juice. If blood sugar continues to be 70 or below, call the Short Stay department and ask to speak to a nurse. Pt voiced understanding. Pt also stated that he did not have anyone that could stay with him 24 hours after surgery. I called Dr. Bosie HelperEarly's office and spoke with Joyce GrossKay about Plavix not being stopped and the fact that pt did not have anyone to stay with him after surgery. She stated that as long as he stops the Plavix today it's ok (I did instruct pt to stop it today when I spoke with him) and she states she will make him a 23 hour observation.

## 2017-04-26 ENCOUNTER — Inpatient Hospital Stay (HOSPITAL_COMMUNITY)
Admission: RE | Admit: 2017-04-26 | Discharge: 2017-04-28 | DRG: 257 | Disposition: A | Discharge status: Home Health | Payer: Medicare Other | Source: Ambulatory Visit | Attending: Vascular Surgery | Admitting: Vascular Surgery

## 2017-04-26 ENCOUNTER — Ambulatory Visit (HOSPITAL_COMMUNITY): Payer: Medicare Other | Admitting: Certified Registered Nurse Anesthetist

## 2017-04-26 ENCOUNTER — Other Ambulatory Visit: Payer: Self-pay

## 2017-04-26 ENCOUNTER — Encounter (HOSPITAL_COMMUNITY)
Admission: RE | Disposition: A | Discharge status: Home Health | Payer: Self-pay | Source: Ambulatory Visit | Attending: Vascular Surgery

## 2017-04-26 ENCOUNTER — Encounter (HOSPITAL_COMMUNITY): Payer: Self-pay

## 2017-04-26 DIAGNOSIS — Z8349 Family history of other endocrine, nutritional and metabolic diseases: Secondary | ICD-10-CM

## 2017-04-26 DIAGNOSIS — Z8673 Personal history of transient ischemic attack (TIA), and cerebral infarction without residual deficits: Secondary | ICD-10-CM

## 2017-04-26 DIAGNOSIS — Z8249 Family history of ischemic heart disease and other diseases of the circulatory system: Secondary | ICD-10-CM | POA: Diagnosis not present

## 2017-04-26 DIAGNOSIS — E669 Obesity, unspecified: Secondary | ICD-10-CM | POA: Diagnosis present

## 2017-04-26 DIAGNOSIS — Z87891 Personal history of nicotine dependence: Secondary | ICD-10-CM | POA: Diagnosis not present

## 2017-04-26 DIAGNOSIS — H9190 Unspecified hearing loss, unspecified ear: Secondary | ICD-10-CM | POA: Diagnosis not present

## 2017-04-26 DIAGNOSIS — Z885 Allergy status to narcotic agent status: Secondary | ICD-10-CM

## 2017-04-26 DIAGNOSIS — I10 Essential (primary) hypertension: Secondary | ICD-10-CM | POA: Diagnosis present

## 2017-04-26 DIAGNOSIS — E1152 Type 2 diabetes mellitus with diabetic peripheral angiopathy with gangrene: Secondary | ICD-10-CM | POA: Diagnosis not present

## 2017-04-26 DIAGNOSIS — E7800 Pure hypercholesterolemia, unspecified: Secondary | ICD-10-CM | POA: Diagnosis not present

## 2017-04-26 DIAGNOSIS — Z79899 Other long term (current) drug therapy: Secondary | ICD-10-CM | POA: Diagnosis not present

## 2017-04-26 DIAGNOSIS — I739 Peripheral vascular disease, unspecified: Secondary | ICD-10-CM | POA: Diagnosis not present

## 2017-04-26 DIAGNOSIS — Z6833 Body mass index (BMI) 33.0-33.9, adult: Secondary | ICD-10-CM

## 2017-04-26 DIAGNOSIS — Z833 Family history of diabetes mellitus: Secondary | ICD-10-CM

## 2017-04-26 DIAGNOSIS — Z881 Allergy status to other antibiotic agents status: Secondary | ICD-10-CM

## 2017-04-26 DIAGNOSIS — I96 Gangrene, not elsewhere classified: Secondary | ICD-10-CM | POA: Diagnosis not present

## 2017-04-26 DIAGNOSIS — N2889 Other specified disorders of kidney and ureter: Secondary | ICD-10-CM | POA: Diagnosis not present

## 2017-04-26 DIAGNOSIS — M19041 Primary osteoarthritis, right hand: Secondary | ICD-10-CM | POA: Diagnosis not present

## 2017-04-26 DIAGNOSIS — Z7982 Long term (current) use of aspirin: Secondary | ICD-10-CM

## 2017-04-26 DIAGNOSIS — Z95820 Peripheral vascular angioplasty status with implants and grafts: Secondary | ICD-10-CM | POA: Diagnosis not present

## 2017-04-26 DIAGNOSIS — L409 Psoriasis, unspecified: Secondary | ICD-10-CM | POA: Diagnosis not present

## 2017-04-26 DIAGNOSIS — M19042 Primary osteoarthritis, left hand: Secondary | ICD-10-CM | POA: Diagnosis not present

## 2017-04-26 DIAGNOSIS — E118 Type 2 diabetes mellitus with unspecified complications: Secondary | ICD-10-CM | POA: Diagnosis not present

## 2017-04-26 DIAGNOSIS — D649 Anemia, unspecified: Secondary | ICD-10-CM | POA: Diagnosis not present

## 2017-04-26 DIAGNOSIS — Z887 Allergy status to serum and vaccine status: Secondary | ICD-10-CM | POA: Diagnosis not present

## 2017-04-26 DIAGNOSIS — Z7902 Long term (current) use of antithrombotics/antiplatelets: Secondary | ICD-10-CM

## 2017-04-26 DIAGNOSIS — I7025 Atherosclerosis of native arteries of other extremities with ulceration: Secondary | ICD-10-CM | POA: Diagnosis not present

## 2017-04-26 DIAGNOSIS — T8189XA Other complications of procedures, not elsewhere classified, initial encounter: Secondary | ICD-10-CM | POA: Diagnosis not present

## 2017-04-26 HISTORY — PX: APPLICATION OF WOUND VAC: SHX5189

## 2017-04-26 HISTORY — PX: AMPUTATION: SHX166

## 2017-04-26 LAB — COMPREHENSIVE METABOLIC PANEL
ALBUMIN: 3.1 g/dL — AB (ref 3.5–5.0)
ALT: 16 U/L — AB (ref 17–63)
AST: 19 U/L (ref 15–41)
Alkaline Phosphatase: 66 U/L (ref 38–126)
Anion gap: 11 (ref 5–15)
BUN: 19 mg/dL (ref 6–20)
CHLORIDE: 102 mmol/L (ref 101–111)
CO2: 23 mmol/L (ref 22–32)
CREATININE: 1.47 mg/dL — AB (ref 0.61–1.24)
Calcium: 8.9 mg/dL (ref 8.9–10.3)
GFR calc Af Amer: 56 mL/min — ABNORMAL LOW (ref >60)
GFR calc non Af Amer: 48 mL/min — ABNORMAL LOW (ref >60)
Glucose, Bld: 217 mg/dL — ABNORMAL HIGH (ref 65–99)
Potassium: 5.3 mmol/L — ABNORMAL HIGH (ref 3.5–5.1)
SODIUM: 136 mmol/L (ref 135–145)
Total Bilirubin: 1.1 mg/dL (ref 0.3–1.2)
Total Protein: 7.1 g/dL (ref 6.5–8.1)

## 2017-04-26 LAB — CBC
HCT: 34 % — ABNORMAL LOW (ref 39.0–52.0)
Hemoglobin: 10.7 g/dL — ABNORMAL LOW (ref 13.0–17.0)
MCH: 31.4 pg (ref 26.0–34.0)
MCHC: 31.5 g/dL (ref 30.0–36.0)
MCV: 99.7 fL (ref 78.0–100.0)
PLATELETS: 363 10*3/uL (ref 150–400)
RBC: 3.41 MIL/uL — AB (ref 4.22–5.81)
RDW: 13.2 % (ref 11.5–15.5)
WBC: 10 10*3/uL (ref 4.0–10.5)

## 2017-04-26 LAB — GLUCOSE, CAPILLARY
GLUCOSE-CAPILLARY: 186 mg/dL — AB (ref 65–99)
Glucose-Capillary: 224 mg/dL — ABNORMAL HIGH (ref 65–99)

## 2017-04-26 SURGERY — AMPUTATION DIGIT
Anesthesia: General | Site: Foot | Laterality: Right

## 2017-04-26 MED ORDER — ACETAMINOPHEN 325 MG PO TABS: 325.0000 mg | ORAL_TABLET | ORAL | Status: DC | PRN

## 2017-04-26 MED ORDER — LISINOPRIL-HYDROCHLOROTHIAZIDE 20-25 MG PO TABS: 1.0000 | ORAL_TABLET | Freq: Every day | ORAL | Status: DC

## 2017-04-26 MED ORDER — 0.9 % SODIUM CHLORIDE (POUR BTL) OPTIME
TOPICAL | Status: DC | PRN
  Administered 2017-04-26: 1000 mL

## 2017-04-26 MED ORDER — HYDROMORPHONE HCL 1 MG/ML IJ SOLN
0.2500 mg | INTRAMUSCULAR | Status: DC | PRN
  Administered 2017-04-26: 0.5 mg via INTRAVENOUS

## 2017-04-26 MED ORDER — PANTOPRAZOLE SODIUM 40 MG PO TBEC
40.0000 mg | DELAYED_RELEASE_TABLET | Freq: Every day | ORAL | Status: DC
  Administered 2017-04-26 – 2017-04-28 (×3): 40 mg via ORAL
  Filled 2017-04-26 (×3): qty 1

## 2017-04-26 MED ORDER — CHLORHEXIDINE GLUCONATE 4 % EX LIQD: 60.0000 mL | Freq: Once | CUTANEOUS | Status: DC

## 2017-04-26 MED ORDER — HYDRALAZINE HCL 20 MG/ML IJ SOLN: 5.0000 mg | INTRAMUSCULAR | Status: DC | PRN

## 2017-04-26 MED ORDER — LACTATED RINGERS IV SOLN
INTRAVENOUS | Status: DC
  Administered 2017-04-26: 11:00:00 via INTRAVENOUS

## 2017-04-26 MED ORDER — ASPIRIN EC 81 MG PO TBEC
81.0000 mg | DELAYED_RELEASE_TABLET | Freq: Every day | ORAL | Status: DC
  Administered 2017-04-27 – 2017-04-28 (×2): 81 mg via ORAL
  Filled 2017-04-26 (×2): qty 1

## 2017-04-26 MED ORDER — HYDROCHLOROTHIAZIDE 25 MG PO TABS
25.0000 mg | ORAL_TABLET | Freq: Every day | ORAL | Status: DC
  Administered 2017-04-26 – 2017-04-28 (×3): 25 mg via ORAL
  Filled 2017-04-26 (×3): qty 1

## 2017-04-26 MED ORDER — PHENOL 1.4 % MT LIQD: 1.0000 | OROMUCOSAL | Status: DC | PRN

## 2017-04-26 MED ORDER — HYDROMORPHONE HCL 1 MG/ML IJ SOLN
INTRAMUSCULAR | Status: AC
  Administered 2017-04-26: 0.5 mg via INTRAVENOUS
  Filled 2017-04-26: qty 1

## 2017-04-26 MED ORDER — TRAMADOL HCL 50 MG PO TABS
50.0000 mg | ORAL_TABLET | Freq: Four times a day (QID) | ORAL | Status: DC | PRN
  Administered 2017-04-26 – 2017-04-28 (×3): 50 mg via ORAL
  Filled 2017-04-26 (×4): qty 1

## 2017-04-26 MED ORDER — AMLODIPINE BESYLATE 5 MG PO TABS
5.0000 mg | ORAL_TABLET | Freq: Every day | ORAL | Status: DC
  Administered 2017-04-27 – 2017-04-28 (×2): 5 mg via ORAL
  Filled 2017-04-26 (×2): qty 1

## 2017-04-26 MED ORDER — PROMETHAZINE HCL 25 MG/ML IJ SOLN: 6.2500 mg | INTRAMUSCULAR | Status: DC | PRN

## 2017-04-26 MED ORDER — FENTANYL CITRATE (PF) 100 MCG/2ML IJ SOLN
INTRAMUSCULAR | Status: DC | PRN
  Administered 2017-04-26 (×3): 25 ug via INTRAVENOUS

## 2017-04-26 MED ORDER — MORPHINE SULFATE (PF) 4 MG/ML IV SOLN
4.0000 mg | INTRAVENOUS | Status: DC | PRN
  Administered 2017-04-27: 4 mg via INTRAVENOUS
  Filled 2017-04-26: qty 1

## 2017-04-26 MED ORDER — GABAPENTIN 600 MG PO TABS
600.0000 mg | ORAL_TABLET | Freq: Every evening | ORAL | Status: DC
  Administered 2017-04-26 – 2017-04-27 (×2): 600 mg via ORAL
  Filled 2017-04-26 (×2): qty 1

## 2017-04-26 MED ORDER — LISINOPRIL 10 MG PO TABS
20.0000 mg | ORAL_TABLET | Freq: Every day | ORAL | Status: DC
  Administered 2017-04-26 – 2017-04-28 (×3): 20 mg via ORAL
  Filled 2017-04-26 (×3): qty 2

## 2017-04-26 MED ORDER — PROPOFOL 10 MG/ML IV BOLUS
INTRAVENOUS | Status: DC | PRN
  Administered 2017-04-26: 120 mg via INTRAVENOUS

## 2017-04-26 MED ORDER — CEFAZOLIN SODIUM-DEXTROSE 2-4 GM/100ML-% IV SOLN
2.0000 g | INTRAVENOUS | Status: AC
  Administered 2017-04-26: 2 g via INTRAVENOUS
  Filled 2017-04-26: qty 100

## 2017-04-26 MED ORDER — ACETAMINOPHEN 325 MG RE SUPP: 325.0000 mg | RECTAL | Status: DC | PRN

## 2017-04-26 MED ORDER — MIDAZOLAM HCL 5 MG/5ML IJ SOLN
INTRAMUSCULAR | Status: DC | PRN
  Administered 2017-04-26: 2 mg via INTRAVENOUS

## 2017-04-26 MED ORDER — INSULIN ASPART 100 UNIT/ML ~~LOC~~ SOLN
0.0000 [IU] | Freq: Three times a day (TID) | SUBCUTANEOUS | Status: DC
  Administered 2017-04-27: 3 [IU] via SUBCUTANEOUS
  Administered 2017-04-27: 5 [IU] via SUBCUTANEOUS
  Administered 2017-04-27 – 2017-04-28 (×2): 3 [IU] via SUBCUTANEOUS
  Administered 2017-04-28: 5 [IU] via SUBCUTANEOUS

## 2017-04-26 MED ORDER — ONDANSETRON HCL 4 MG/2ML IJ SOLN
INTRAMUSCULAR | Status: AC
  Filled 2017-04-26: qty 2

## 2017-04-26 MED ORDER — MIDAZOLAM HCL 2 MG/2ML IJ SOLN
INTRAMUSCULAR | Status: AC
  Filled 2017-04-26: qty 2

## 2017-04-26 MED ORDER — METOPROLOL TARTRATE 5 MG/5ML IV SOLN: 2.0000 mg | INTRAVENOUS | Status: DC | PRN

## 2017-04-26 MED ORDER — SODIUM CHLORIDE 0.9 % IV SOLN
INTRAVENOUS | Status: DC
  Administered 2017-04-26: 21:00:00 via INTRAVENOUS

## 2017-04-26 MED ORDER — LIDOCAINE HCL (CARDIAC) 20 MG/ML IV SOLN
INTRAVENOUS | Status: AC
  Filled 2017-04-26: qty 5

## 2017-04-26 MED ORDER — ONDANSETRON HCL 4 MG/2ML IJ SOLN
INTRAMUSCULAR | Status: DC | PRN
  Administered 2017-04-26: 4 mg via INTRAVENOUS

## 2017-04-26 MED ORDER — PROPOFOL 10 MG/ML IV BOLUS
INTRAVENOUS | Status: AC
  Filled 2017-04-26: qty 20

## 2017-04-26 MED ORDER — SIMVASTATIN 20 MG PO TABS
20.0000 mg | ORAL_TABLET | Freq: Every day | ORAL | Status: DC
  Administered 2017-04-26 – 2017-04-27 (×2): 20 mg via ORAL
  Filled 2017-04-26 (×2): qty 1

## 2017-04-26 MED ORDER — ONDANSETRON HCL 4 MG/2ML IJ SOLN: 4.0000 mg | Freq: Four times a day (QID) | INTRAMUSCULAR | Status: DC | PRN

## 2017-04-26 MED ORDER — FENTANYL CITRATE (PF) 250 MCG/5ML IJ SOLN
INTRAMUSCULAR | Status: AC
  Filled 2017-04-26: qty 5

## 2017-04-26 MED ORDER — DOCUSATE SODIUM 100 MG PO CAPS
100.0000 mg | ORAL_CAPSULE | Freq: Every day | ORAL | Status: DC
  Administered 2017-04-27 – 2017-04-28 (×2): 100 mg via ORAL
  Filled 2017-04-26 (×2): qty 1

## 2017-04-26 MED ORDER — GUAIFENESIN-DM 100-10 MG/5ML PO SYRP: 15.0000 mL | ORAL_SOLUTION | ORAL | Status: DC | PRN

## 2017-04-26 MED ORDER — POTASSIUM CHLORIDE CRYS ER 20 MEQ PO TBCR: 20.0000 meq | EXTENDED_RELEASE_TABLET | Freq: Every day | ORAL | Status: DC | PRN

## 2017-04-26 MED ORDER — ALUM & MAG HYDROXIDE-SIMETH 200-200-20 MG/5ML PO SUSP: 15.0000 mL | ORAL | Status: DC | PRN

## 2017-04-26 MED ORDER — PHENYLEPHRINE 40 MCG/ML (10ML) SYRINGE FOR IV PUSH (FOR BLOOD PRESSURE SUPPORT)
PREFILLED_SYRINGE | INTRAVENOUS | Status: DC | PRN
  Administered 2017-04-26 (×7): 80 ug via INTRAVENOUS

## 2017-04-26 MED ORDER — LABETALOL HCL 5 MG/ML IV SOLN: 10.0000 mg | INTRAVENOUS | Status: DC | PRN

## 2017-04-26 MED ORDER — CEFAZOLIN SODIUM-DEXTROSE 2-4 GM/100ML-% IV SOLN
2.0000 g | Freq: Three times a day (TID) | INTRAVENOUS | Status: AC
  Administered 2017-04-26 – 2017-04-27 (×2): 2 g via INTRAVENOUS
  Filled 2017-04-26 (×2): qty 100

## 2017-04-26 MED ORDER — SODIUM CHLORIDE 0.9 % IV SOLN: INTRAVENOUS | Status: DC

## 2017-04-26 MED ORDER — CLOPIDOGREL BISULFATE 75 MG PO TABS
75.0000 mg | ORAL_TABLET | Freq: Every day | ORAL | Status: DC
  Administered 2017-04-27 – 2017-04-28 (×2): 75 mg via ORAL
  Filled 2017-04-26 (×2): qty 1

## 2017-04-26 MED ORDER — HEPARIN SODIUM (PORCINE) 5000 UNIT/ML IJ SOLN
5000.0000 [IU] | Freq: Three times a day (TID) | INTRAMUSCULAR | Status: DC
  Administered 2017-04-26 – 2017-04-28 (×6): 5000 [IU] via SUBCUTANEOUS
  Filled 2017-04-26 (×6): qty 1

## 2017-04-26 MED ORDER — DEXAMETHASONE SODIUM PHOSPHATE 10 MG/ML IJ SOLN
INTRAMUSCULAR | Status: AC
Start: 2017-04-26 — End: 2017-04-26
  Filled 2017-04-26: qty 1

## 2017-04-26 MED ORDER — FLUTICASONE PROPIONATE 50 MCG/ACT NA SUSP: 2.0000 | Freq: Every day | NASAL | Status: DC | PRN

## 2017-04-26 MED ORDER — MAGNESIUM SULFATE 2 GM/50ML IV SOLN: 2.0000 g | Freq: Every day | INTRAVENOUS | Status: DC | PRN

## 2017-04-26 MED ORDER — LIDOCAINE 2% (20 MG/ML) 5 ML SYRINGE
INTRAMUSCULAR | Status: DC | PRN
  Administered 2017-04-26: 90 mg via INTRAVENOUS

## 2017-04-26 SURGICAL SUPPLY — 28 items
BNDG GAUZE ELAST 4 BULKY (GAUZE/BANDAGES/DRESSINGS) ×2 IMPLANT
CANISTER SUCT 3000ML PPV (MISCELLANEOUS) ×2 IMPLANT
CLIP LIGATING EXTRA MED SLVR (CLIP) ×2 IMPLANT
CLIP LIGATING EXTRA SM BLUE (MISCELLANEOUS) ×2 IMPLANT
COVER SURGICAL LIGHT HANDLE (MISCELLANEOUS) ×4 IMPLANT
DRAPE EXTREMITY T 121X128X90 (DRAPE) ×2 IMPLANT
DRSG VAC ATS SM SENSATRAC (GAUZE/BANDAGES/DRESSINGS) ×2 IMPLANT
ELECT REM PT RETURN 9FT ADLT (ELECTROSURGICAL) ×2
ELECTRODE REM PT RTRN 9FT ADLT (ELECTROSURGICAL) ×1 IMPLANT
GAUZE SPONGE 4X4 12PLY STRL (GAUZE/BANDAGES/DRESSINGS) ×2 IMPLANT
GLOVE SS BIOGEL STRL SZ 7.5 (GLOVE) ×1 IMPLANT
GLOVE SUPERSENSE BIOGEL SZ 7.5 (GLOVE) ×1
GOWN STRL REUS W/ TWL LRG LVL3 (GOWN DISPOSABLE) ×3 IMPLANT
GOWN STRL REUS W/TWL LRG LVL3 (GOWN DISPOSABLE) ×3
KIT BASIN OR (CUSTOM PROCEDURE TRAY) ×2 IMPLANT
KIT TURNOVER KIT B (KITS) ×2 IMPLANT
NEEDLE 22X1 1/2 (OR ONLY) (NEEDLE) IMPLANT
NS IRRIG 1000ML POUR BTL (IV SOLUTION) ×2 IMPLANT
PACK GENERAL/GYN (CUSTOM PROCEDURE TRAY) ×2 IMPLANT
PAD ARMBOARD 7.5X6 YLW CONV (MISCELLANEOUS) ×4 IMPLANT
SUT ETHILON 3 0 PS 1 (SUTURE) ×2 IMPLANT
SUT VIC AB 3-0 SH 27 (SUTURE) ×1
SUT VIC AB 3-0 SH 27X BRD (SUTURE) ×1 IMPLANT
SYR CONTROL 10ML LL (SYRINGE) IMPLANT
TOWEL GREEN STERILE (TOWEL DISPOSABLE) ×4 IMPLANT
UNDERPAD 30X30 (UNDERPADS AND DIAPERS) ×2 IMPLANT
WATER STERILE IRR 1000ML POUR (IV SOLUTION) ×2 IMPLANT
WND VAC CANISTER 500ML (MISCELLANEOUS) ×2 IMPLANT

## 2017-04-26 NOTE — Anesthesia Preprocedure Evaluation (Addendum)
Anesthesia Evaluation  Patient identified by MRN, date of birth, ID band Patient awake    Reviewed: Allergy & Precautions, NPO status , Patient's Chart, lab work & pertinent test results  History of Anesthesia Complications Negative for: history of anesthetic complications  Airway Mallampati: I  TM Distance: >3 FB Neck ROM: Full    Dental  (+) Dental Advisory Given   Pulmonary former smoker,    Pulmonary exam normal        Cardiovascular hypertension, Pt. on medications + Peripheral Vascular Disease  Normal cardiovascular exam     Neuro/Psych CVA negative neurological ROS  negative psych ROS   GI/Hepatic negative GI ROS, Neg liver ROS,   Endo/Other  diabetes  Renal/GU Renal InsufficiencyRenal disease  negative genitourinary   Musculoskeletal negative musculoskeletal ROS (+)   Abdominal   Peds negative pediatric ROS (+)  Hematology negative hematology ROS (+)   Anesthesia Other Findings   Reproductive/Obstetrics negative OB ROS                            Anesthesia Physical Anesthesia Plan  ASA: III  Anesthesia Plan: General   Post-op Pain Management:    Induction: Intravenous  PONV Risk Score and Plan:   Airway Management Planned: LMA  Additional Equipment:   Intra-op Plan:   Post-operative Plan: Extubation in OR  Informed Consent: I have reviewed the patients History and Physical, chart, labs and discussed the procedure including the risks, benefits and alternatives for the proposed anesthesia with the patient or authorized representative who has indicated his/her understanding and acceptance.   Dental advisory given  Plan Discussed with: Anesthesiologist and CRNA  Anesthesia Plan Comments:        Anesthesia Quick Evaluation

## 2017-04-26 NOTE — Op Note (Signed)
    OPERATIVE REPORT  DATE OF SURGERY: 04/26/2017  PATIENT: James Dougherty, 67 y.o. male MRN: [id number]  DOB: Jan 10, 1951  PRE-OPERATIVE DIAGNOSIS: Gangrene right great toe  POST-OPERATIVE DIAGNOSIS:  Same  PROCEDURE: Open ray amputation right great toe and VAC placement  SURGEON:  Gretta Beganodd Umberto Pavek, M.D.  PHYSICIAN ASSISTANT: Nurse  ANESTHESIA: General  EBL: Minimal ml  Total I/O In: 800 [I.V.:800] Out: 10 [Blood:10]  BLOOD ADMINISTERED: None  DRAINS: VAC  SPECIMEN: Right great toe  COUNTS CORRECT:  YES  PLAN OF CARE: PACU  PATIENT DISPOSITION:  PACU - hemodynamically stable  PROCEDURE DETAILS: The patient was taken to the operative placed supine position where the area of the right foot was prepped and draped you sterile fashion.  A fishmouth incision was made at the base of the right great toe and carried down to the medial aspect of the foot for a ray amputation.  There was excellent bleeding in the skin and subcutaneous tissue all appeared viable.  Patient had dry gangrene of the entire great toe with some soupiness at the junction with viable tissue.  The metatarsal head was exposed and the metatarsal bone was exposed further proximally with a key elevator.  Metatarsal bone was transected.  Specimen was passed off the field.  The tendon sheath and the plantar aspect was exposed and was retracted out of its sheath and the tendon had pus around it.  For this reason the tendon was debrided further proximally and the sheath was opened.  Due to this was felt not possible to close the toe amputation primarily.  The wounds were copious irrigated and hemostasis obtained electrocautery.  The wound VAC was brought onto the field was cut to the appropriate dimension and was placed in the base of the toe amputation.  Patient tolerated procedure and was transferred to the recovery room in stable condition   James Dougherty, M.D., [facility] 04/26/2017 12:38 PM

## 2017-04-26 NOTE — Anesthesia Postprocedure Evaluation (Signed)
Anesthesia Post Note  Patient: James SideJames F Massi  Procedure(s) Performed: AMPUTATION TRANSMETATARSAL RIGHT GREAT TOE (Right Foot) APPLICATION OF WOUND VAC (Right Foot)     Patient location during evaluation: PACU Anesthesia Type: General Level of consciousness: sedated Pain management: pain level controlled Vital Signs Assessment: post-procedure vital signs reviewed and stable Respiratory status: spontaneous breathing and respiratory function stable Cardiovascular status: stable Postop Assessment: no apparent nausea or vomiting Anesthetic complications: no    Last Vitals:  Vitals:   04/26/17 1345 04/26/17 1400  BP: (!) 143/63 (!) 141/58  Pulse: 79 87  Resp: 15 (!) 21  Temp:    SpO2: 100% 100%    Last Pain:  Vitals:   04/26/17 1330  TempSrc:   PainSc: 0-No pain        RLE Motor Response: Purposeful movement;Responds to commands (04/26/17 1400) RLE Sensation: No numbness;No tingling (04/26/17 1400)      Ivannah Zody,Gerhardt DANIEL

## 2017-04-26 NOTE — Transfer of Care (Signed)
Immediate Anesthesia Transfer of Care Note  Patient: James Dougherty  Procedure(s) Performed: AMPUTATION TRANSMETATARSAL RIGHT GREAT TOE (Right Foot) APPLICATION OF WOUND VAC (Right Foot)  Patient Location: PACU  Anesthesia Type:General  Level of Consciousness: drowsy and patient cooperative  Airway & Oxygen Therapy: Patient Spontanous Breathing and Patient connected to face mask oxygen  Post-op Assessment: Report given to RN and Post -op Vital signs reviewed and stable  Post vital signs: Reviewed and stable  Last Vitals:  Vitals Value Taken Time  BP    Temp    Pulse    Resp    SpO2      Last Pain:  Vitals:   04/26/17 1033  TempSrc:   PainSc: 0-No pain         Complications: No apparent anesthesia complications

## 2017-04-26 NOTE — Interval H&P Note (Signed)
History and Physical Interval Note:  04/26/2017 11:26 AM  James Dougherty  has presented today for surgery, with the diagnosis of NON-VIABLE TISSUE RIGHT GREAT TOE  The various methods of treatment have been discussed with the patient and family. After consideration of risks, benefits and other options for treatment, the patient has consented to  Procedure(s): AMPUTATION DIGIT RIGHT GREAT TOE (Right) as a surgical intervention .  The patient's history has been reviewed, patient examined, no change in status, stable for surgery.  I have reviewed the patient's chart and labs.  Questions were answered to the patient's satisfaction.     Gretta Beganodd Karmella Bouvier

## 2017-04-26 NOTE — Anesthesia Procedure Notes (Signed)
Procedure Name: LMA Insertion Date/Time: 04/26/2017 11:40 AM Performed by: Pearson Grippeobertson, Menachem Urbanek M, CRNA Pre-anesthesia Checklist: Patient identified, Emergency Drugs available, Suction available and Patient being monitored Patient Re-evaluated:Patient Re-evaluated prior to induction Oxygen Delivery Method: Circle system utilized Preoxygenation: Pre-oxygenation with 100% oxygen Induction Type: IV induction Ventilation: Mask ventilation without difficulty LMA: LMA inserted LMA Size: 5.0 Number of attempts: 1 Airway Equipment and Method: Bite block Placement Confirmation: positive ETCO2 Tube secured with: Tape Dental Injury: Teeth and Oropharynx as per pre-operative assessment

## 2017-04-27 ENCOUNTER — Encounter (HOSPITAL_COMMUNITY): Payer: Self-pay | Admitting: Vascular Surgery

## 2017-04-27 ENCOUNTER — Ambulatory Visit: Payer: Medicare Other | Admitting: Family

## 2017-04-27 LAB — CBC
HEMATOCRIT: 32.5 % — AB (ref 39.0–52.0)
HEMOGLOBIN: 10.2 g/dL — AB (ref 13.0–17.0)
MCH: 31.5 pg (ref 26.0–34.0)
MCHC: 31.4 g/dL (ref 30.0–36.0)
MCV: 100.3 fL — ABNORMAL HIGH (ref 78.0–100.0)
PLATELETS: 351 10*3/uL (ref 150–400)
RBC: 3.24 MIL/uL — ABNORMAL LOW (ref 4.22–5.81)
RDW: 13.1 % (ref 11.5–15.5)
WBC: 9.4 10*3/uL (ref 4.0–10.5)

## 2017-04-27 LAB — BASIC METABOLIC PANEL
Anion gap: 10 (ref 5–15)
BUN: 19 mg/dL (ref 6–20)
CHLORIDE: 100 mmol/L — AB (ref 101–111)
CO2: 26 mmol/L (ref 22–32)
Calcium: 8.6 mg/dL — ABNORMAL LOW (ref 8.9–10.3)
Creatinine, Ser: 1.55 mg/dL — ABNORMAL HIGH (ref 0.61–1.24)
GFR calc Af Amer: 52 mL/min — ABNORMAL LOW (ref >60)
GFR, EST NON AFRICAN AMERICAN: 45 mL/min — AB (ref >60)
Glucose, Bld: 197 mg/dL — ABNORMAL HIGH (ref 65–99)
POTASSIUM: 5.3 mmol/L — AB (ref 3.5–5.1)
SODIUM: 136 mmol/L (ref 135–145)

## 2017-04-27 LAB — GLUCOSE, CAPILLARY
GLUCOSE-CAPILLARY: 174 mg/dL — AB (ref 65–99)
GLUCOSE-CAPILLARY: 187 mg/dL — AB (ref 65–99)
GLUCOSE-CAPILLARY: 241 mg/dL — AB (ref 65–99)
Glucose-Capillary: 162 mg/dL — ABNORMAL HIGH (ref 65–99)
Glucose-Capillary: 190 mg/dL — ABNORMAL HIGH (ref 65–99)

## 2017-04-27 NOTE — Progress Notes (Signed)
Inpatient Diabetes Program Recommendations  AACE/ADA: New Consensus Statement on Inpatient Glycemic Control (2015)  Target Ranges:  Prepandial:   less than 140 mg/dL      Peak postprandial:   less than 180 mg/dL (1-2 hours)      Critically ill patients:  140 - 180 mg/dL   Lab Results  Component Value Date   GLUCAP 187 (H) 04/27/2017    Review of Glycemic Control Results for James SidePPERLY, James F (MRN [id number]) as of 04/27/2017 09:32  Ref. Range 04/26/2017 10:23 04/26/2017 12:49 04/26/2017 21:43 04/27/2017 06:01  Glucose-Capillary Latest Ref Range: 65 - 99 mg/dL 952224 (H) 841186 (H) 324174 (H) 187 (H)   Diabetes history: Type 2 DM Outpatient Diabetes medications: Novolin 70/30 10 units BID, Trulicity 0.75mg  Q/wk Current orders for Inpatient glycemic control[facility]  Inpatient Diabetes Program Recommendations:    Per vascular note in 11/2016- "Pt states his last A1C was 8.6 in October 2018, uncontrolled; pt states the next step for medications is Trulicity for better control."  May want to repeat A1C to see current??   Thanks, James RaveLauren Chase Knebel, MSN, RNC-OB Diabetes Coordinator [phone] (8a-5p)

## 2017-04-27 NOTE — Progress Notes (Signed)
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 PMH - DM, HTN, neuropathy, arthritis, PAD, CVA    PT Comments    Pt seen for second session to work toward home alone with intermittent support. Pt making good progress. Will plan to see tomorrow for gait including stair training.    Follow Up Recommendations  Home health PT;Supervision - Intermittent(HH aide)     Equipment Recommendations  None recommended by PT    Recommendations for Other Services       Precautions / Restrictions Precautions Precautions: Fall Required Braces or Orthoses: Other Brace/Splint Other Brace/Splint: Post op shoe (Darco) Restrictions Weight Bearing Restrictions: Yes Other Position/Activity Restrictions: Heel wt bearing in post-op shoe on rt    Mobility  Bed Mobility Overal bed mobility: Needs Assistance Bed Mobility: Sit to Supine       Sit to supine: Supervision   General bed mobility comments: Incr time to perform  Transfers Overall transfer level: Needs assistance Equipment used: Rolling walker (2 wheeled) Transfers: Sit to/from Stand Sit to Stand: Min assist         General transfer comment: Assist for balance and verbal cues for hand placement  Ambulation/Gait Ambulation/Gait assistance: Min guard Ambulation Distance (Feet): 125 Feet Assistive device: Rolling walker (2 wheeled) Gait Pattern/deviations: Step-through pattern;Decreased step length - right;Decreased step length - left;Decreased stance time - right Gait velocity: decr Gait velocity interpretation: Below normal speed for age/gender General Gait Details: Assist for safety and lines   Stairs            Wheelchair Mobility    Modified Rankin (Stroke Patients Only)       Balance Overall balance  assessment: Needs assistance Sitting-balance support: No upper extremity supported;Feet supported Sitting balance-Leahy Scale: Good     Standing balance support: Bilateral upper extremity supported Standing balance-Leahy Scale: Poor Standing balance comment: walker and min guard for static standing                            Cognition Arousal/Alertness: Awake/alert Behavior During Therapy: WFL for tasks assessed/performed Overall Cognitive Status: Within Functional Limits for tasks assessed                                        Exercises      General Comments        Pertinent Vitals/Pain Pain Assessment: No/denies pain    Home Living                      Prior Function            PT Goals (current goals can now be found in the care plan section) Progress towards PT goals: Progressing toward goals    Frequency    Min 3X/week      PT Plan      Co-evaluation              AM-PAC PT "6 Clicks" Daily Activity  Outcome Measure  Difficulty turning over in bed (including adjusting bedclothes, sheets and blankets)?: A Little Difficulty moving from lying on back to sitting on the Dougherty of the bed? : A  Little Difficulty sitting down on and standing up from a chair with arms (e.g., wheelchair, bedside commode, etc,.)?: Unable Help needed moving to and from a bed to chair (including a wheelchair)?: A Little Help needed walking in hospital room?: A Little Help needed climbing 3-5 steps with a railing? : A Little 6 Click Score: 16    End of Session Equipment Utilized During Treatment: Gait belt Activity Tolerance: Patient tolerated treatment well Patient left: with call bell/phone within reach;with family/visitor present;in bed   PT Visit Diagnosis: Unsteadiness on feet (R26.81)     Time: 1410-1430 PT Time Calculation (min) (ACUTE ONLY): 20 min  Charges:  $Gait Training: 8-22 mins                    G Codes:        [facility] PT [phone]    Angelina Ok [facility] 04/27/2017, 3:00 PM

## 2017-04-27 NOTE — Care Management Note (Signed)
Case Management Note Donn PieriniKristi Poonam Woehrle RN, BSN Unit 4E-Case Manager [phone]  Patient Details  Name: James SideJames F Dougherty MRN: [id number] Date of Birth: Aug 27, 1950  Subjective/Objective:   Pt admitted with gangrene of foot s/p Open ray amputation right great toe and VAC placement                 Action/Plan: PTA pt lived at home alone, daughter nearby to assist. CM received consult for home wound VAC- [facility] orders have been placed for HHRN/PT/OT/aide- Per PT pt safe to return home with daughter to provide intermittent supervision and assistance. [facility] wound VAC form has been signedBarista- Spoke with pt and daughter at bedside- [facility] in past and wish to use them again [facility]- request [facility] RN- Ardelle BallsKathy Denny- pt reports that he has a RW and BSC already at home. [facility] and [facility] needs- wound VAC form given to Lupita LeashDonna to start process for approval- once approved home wound VAC will be delivered to room and changed out at bedside [facility] to [facility] wound [facility]AC from hospital vac. HH [facility]  [facility] and name of preferred home RN provided per family request. CM will continue to follow for transition to home needs for potential d/c 04/28/17  Expected Discharge Date:                  Expected Discharge Plan:  Home w Home Health Services  In-House Referral:  NA  Discharge planning Services  CM Consult  Post Acute Care Choice:  Home Health, Durable Medical Equipment Choice offered to:  Patient, Adult Children  DME Arranged:  Vac DME Agency:  [facility]  HH Arranged:  RN, PT, OT, Nurse's Aide HH Agency:  [facility]  Status of Service:  In process, will continue to follow  If discussed at Long Length of Stay Meetings, dates discussed:    Discharge Disposition: home/home health   Additional Comments:  Darrold SpanWebster, Jolyssa Oplinger Hall, RN 04/27/2017, 1:13 PM

## 2017-04-27 NOTE — Progress Notes (Signed)
Orthopedic Tech Progress Note Patient Details:  James Dougherty August 10, 1950 [id number]  Ortho Devices Type of Ortho Device: Darco shoe Ortho Device/Splint Interventions: Application   Post Interventions Patient Tolerated: Well Instructions Provided: Care of device   James FordyceJennifer C Jagger Dougherty 04/27/2017, 10:51 AM

## 2017-04-27 NOTE — Progress Notes (Signed)
Patient ID: Sonia SideJames F Dougherty, male   DOB: 02-28-1950, 67 y.o.   MRN: [id number] Comfortable this morning.  Minimal pain from open toe amputation VAC wound in place. Will coordinate outpatient home health nurse [facility]. [facility] for approximately 2 weeks and then hopefully can switch to wet-to-dry dressings.  Possible discharge in a.m.

## 2017-04-27 NOTE — Progress Notes (Signed)
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 PMH - DM, HTN, neuropathy, arthritis, PAD, CVA   Clinical Impression   PTA, pt lived alone and was independent with ADL and mobility. Pt currently requires min guard A with mobility and min A with LB ADL. Will follow acutely to address compensatory techniques and use of AE/DME for ADL and functional mobility. Recommend follow up with HHOT to facilitate return to PLOF.     Follow Up Recommendations  Home health OT;Supervision - Intermittent    Equipment Recommendations  Tub/shower bench    Recommendations for Other Services       Precautions / Restrictions Precautions Precautions: Fall Required Braces or Orthoses: Other Brace/Splint Other Brace/Splint: Post op shoe (Darco) Restrictions Weight Bearing Restrictions: Yes Other Position/Activity Restrictions: Heel wt bearing in post-op shoe on rt      Mobility Bed Mobility Overal bed mobility: Needs Assistance Bed Mobility: Supine to Sit     Supine to sit: Supervision Sit to supine: Supervision   General bed mobility comments: Incr time to perform  Transfers Overall transfer level: Needs assistance Equipment used: Rolling walker (2 wheeled) Transfers: Sit to/from Stand Sit to Stand: Min guard         General transfer comment: vc for hand placement    Balance Overall balance assessment: Needs assistance Sitting-balance support: No upper extremity supported;Feet supported Sitting balance-Leahy Scale: Good     Standing balance support: Bilateral upper extremity supported Standing balance-Leahy Scale: Fair Standing balance comment: walker and min guard for static standing                           ADL either performed or assessed with  clinical judgement   ADL Overall ADL's : Needs assistance/impaired     Grooming: Set up;Supervision/safety;Sitting   Upper Body Bathing: Set up;Supervision/ safety;Sitting   Lower Body Bathing: Minimal assistance;Sit to/from stand   Upper Body Dressing : Set up;Supervision/safety;Sitting   Lower Body Dressing: Minimal assistance;Sit to/from stand   Toilet Transfer: Min guard;RW   Toileting- ArchitectClothing Manipulation and Hygiene: Supervision/safety;Sit to/from stand       Functional mobility during ADLs: Min guard;Rolling walker General ADL Comments: Able to cross L LE over R knee for ADL. Able to reach R foot. Pt concerned about how he is going to manage wound vac and ADL. Pt may benefit form AE. Disucssed possible use of tub bench to increase safety wtih transfers given WB through heel onlyl. May be able to use 3in1 as shower chair - discussed with family and recommend that [facility] further assess bathroom equipment     Vision         Perception     Praxis      Pertinent Vitals/Pain Pain Assessment: Faces Faces Pain Scale: Hurts a little bit Pain Location: R foot Pain Descriptors / Indicators: Tingling(stinging) Pain Intervention(s): Limited activity within patient's tolerance;Repositioned     Hand Dominance Right   Extremity/Trunk Assessment Upper Extremity Assessment Upper Extremity Assessment: Overall WFL for tasks assessed   Lower Extremity Assessment Lower Extremity Assessment: Defer to PT evaluation LLE Sensation: history of peripheral neuropathy   Cervical / Trunk Assessment Cervical / Trunk Assessment: Normal   Communication Communication Communication: HOH   Cognition Arousal/Alertness: Awake/alert Behavior During  Therapy: WFL for tasks assessed/performed Overall Cognitive Status: Within Functional Limits for tasks assessed                                     General Comments       Exercises     Shoulder Instructions      Home  Living Family/patient expects to be discharged to:: Private residence Living Arrangements: Alone Available Help at Discharge: Family;Available PRN/intermittently Type of Home: House Home Access: Stairs to enter [employer] of Steps: 3 Entrance Stairs-Rails: None Home Layout: One level     Bathroom Shower/Tub: IT trainer: Standard Bathroom Accessibility: Yes How Accessible: Accessible via walker Home Equipment: Walker - 2 wheels;Bedside commode          Prior Functioning/Environment Level of Independence: Independent                 OT Problem List: Decreased activity tolerance;Impaired balance (sitting and/or standing);Decreased safety awareness;Decreased knowledge of use of DME or AE;Decreased knowledge of precautions;Pain;Increased edema      OT Treatment/Interventions: Self-care/ADL training;Therapeutic exercise;DME and/or AE instruction;Therapeutic activities;Patient/family education;Balance training    OT Goals(Current goals can be found in the care plan section) Acute Rehab OT Goals Patient Stated Goal: return home OT Goal Formulation: With patient/family Time For Goal Achievement: 05/11/17 Potential to Achieve Goals: Good  OT Frequency: Min 3X/week   Barriers to D/C:            Co-evaluation              AM-PAC PT "6 Clicks" Daily Activity     Outcome Measure Help from another person eating meals?: None Help from another person taking care of personal grooming?: A Little Help from another person toileting, which includes using toliet, bedpan, or urinal?: A Little Help from another person bathing (including washing, rinsing, drying)?: A Little Help from another person to put on and taking off regular upper body clothing?: None Help from another person to put on and taking off regular lower body clothing?: A Little 6 Click Score: 20   End of Session Equipment Utilized During Treatment: Gait belt;Rolling  walker Nurse Communication: Mobility status;Weight bearing status  Activity Tolerance: Patient tolerated treatment well Patient left: in bed;with call bell/phone within reach;with bed alarm set;with family/visitor present;with SCD's reapplied  OT Visit Diagnosis: Unsteadiness on feet (R26.81);Muscle weakness (generalized) (M62.81);Pain Pain - Right/Left: Right Pain - part of body: Ankle and joints of foot                Time: 1450-1508 OT Time Calculation (min): 18 min Charges:  OT General Charges $OT Visit: 1 Visit OT Evaluation $OT Eval Moderate Complexity: 1 Mod G-Codes:     Arnav Cregg, OT/L  [phone] 04/27/2017  Eula Mazzola,HILLARY 04/27/2017, 3:27 PM

## 2017-04-27 NOTE — Evaluation (Signed)
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 PMH - DM, HTN, neuropathy, arthritis, PAD, CVA  Clinical Impression  Pt admitted with above diagnosis and presents to PT with functional limitations due to deficits listed below (See PT problem list). Pt needs skilled PT to maximize independence and safety to allow discharge to home with intermittent support of home health services and daughter. Pt motivated to return to independence and expect he will make good progress.     Follow Up Recommendations Home health PT;Supervision - Intermittent(HH aide)    Equipment Recommendations  None recommended by PT    Recommendations for Other Services       Precautions / Restrictions Precautions Precautions: Fall Required Braces or Orthoses: Other Brace/Splint Other Brace/Splint: Post op shoe (Darco) Restrictions Weight Bearing Restrictions: Yes Other Position/Activity Restrictions: Heel wt bearing in post-op shoe on rt      Mobility  Bed Mobility Overal bed mobility: Needs Assistance Bed Mobility: Supine to Sit     Supine to sit: Supervision     General bed mobility comments: Incr time to perform  Transfers Overall transfer level: Needs assistance Equipment used: Rolling walker (2 wheeled) Transfers: Sit to/from Stand Sit to Stand: Min assist         General transfer comment: Assist for balance and verbal cues for hand placement  Ambulation/Gait Ambulation/Gait assistance: Min assist;Min guard Ambulation Distance (Feet): 200 Feet Assistive device: Rolling walker (2 wheeled) Gait Pattern/deviations: Step-through pattern;Decreased step length - right;Decreased step length - left;Decreased stance time - right Gait velocity: decr Gait velocity interpretation: Below  normal speed for age/gender General Gait Details: Initial assist for balance and technique. Improving stability with incr distance and assist decr to min guard.   Stairs            Wheelchair Mobility    Modified Rankin (Stroke Patients Only)       Balance Overall balance assessment: Needs assistance Sitting-balance support: No upper extremity supported;Feet supported Sitting balance-Leahy Scale: Good     Standing balance support: Bilateral upper extremity supported Standing balance-Leahy Scale: Poor Standing balance comment: walker and min guard for static standing                             Pertinent Vitals/Pain Pain Assessment: No/denies pain    Home Living Family/patient expects to be discharged to:: Private residence Living Arrangements: Alone Available Help at Discharge: Family;Available PRN/intermittently Type of Home: House Home Access: Stairs to enter Entrance Stairs-Rails: None Entrance Stairs-Number of Steps: 3 Home Layout: One level Home Equipment: Environmental consultantWalker - 2 wheels      Prior Function Level of Independence: Independent               Hand Dominance        Extremity/Trunk Assessment   Upper Extremity Assessment Upper Extremity Assessment: Defer to OT evaluation    Lower Extremity Assessment Lower Extremity Assessment: RLE deficits/detail;LLE deficits/detail RLE Deficits / Details: Limited to heel weight bearing RLE Sensation: history of peripheral neuropathy LLE Sensation: history of peripheral neuropathy       Communication   Communication: HOH  Cognition Arousal/Alertness: Awake/alert Behavior During Therapy: WFL for tasks assessed/performed Overall Cognitive Status: Within Functional Limits for tasks assessed  General Comments      Exercises     Assessment/Plan    PT Assessment Patient needs continued PT services  PT Problem List Decreased  balance;Decreased mobility;Decreased knowledge of use of DME;Decreased knowledge of precautions       PT Treatment Interventions DME instruction;Gait training;Stair training;Functional mobility training;Therapeutic activities;Therapeutic exercise;Balance training;Patient/family education    PT Goals (Current goals can be found in the Care Plan section)  Acute Rehab PT Goals Patient Stated Goal: return home PT Goal Formulation: With patient/family Time For Goal Achievement: 05/04/17 Potential to Achieve Goals: Good    Frequency Min 3X/week   Barriers to discharge Inaccessible home environment;Decreased caregiver support Lives alone and stairs to enter home    Co-evaluation               AM-PAC PT "6 Clicks" Daily Activity  Outcome Measure Difficulty turning over in bed (including adjusting bedclothes, sheets and blankets)?: A Little Difficulty moving from lying on back to sitting on the side of the bed? : A Little Difficulty sitting down on and standing up from a chair with arms (e.g., wheelchair, bedside commode, etc,.)?: Unable Help needed moving to and from a bed to chair (including a wheelchair)?: A Little Help needed walking in hospital room?: A Little Help needed climbing 3-5 steps with a railing? : A Little 6 Click Score: 16    End of Session Equipment Utilized During Treatment: Gait belt Activity Tolerance: Patient tolerated treatment well Patient left: in chair;with call bell/phone within reach;with family/visitor present Nurse Communication: Mobility status PT Visit Diagnosis: Unsteadiness on feet (R26.81)    Time: 0454-0981 PT Time Calculation (min) (ACUTE ONLY): 51 min   Charges:   PT Evaluation $PT Eval Moderate Complexity: 1 Mod PT Treatments $Gait Training: 23-37 mins   PT G CodesMarland Kitchen        [facility] PT [phone]   Angelina Ok [facility] 04/27/2017, 1:45 PM

## 2017-04-28 LAB — CBC
HEMATOCRIT: 31 % — AB (ref 39.0–52.0)
HEMOGLOBIN: 9.5 g/dL — AB (ref 13.0–17.0)
MCH: 30.4 pg (ref 26.0–34.0)
MCHC: 30.6 g/dL (ref 30.0–36.0)
MCV: 99 fL (ref 78.0–100.0)
Platelets: 347 10*3/uL (ref 150–400)
RBC: 3.13 MIL/uL — AB (ref 4.22–5.81)
RDW: 13 % (ref 11.5–15.5)
WBC: 6.5 10*3/uL (ref 4.0–10.5)

## 2017-04-28 LAB — GLUCOSE, CAPILLARY
Glucose-Capillary: 211 mg/dL — ABNORMAL HIGH (ref 65–99)
Glucose-Capillary: 173 mg/dL — ABNORMAL HIGH (ref 65–99)

## 2017-04-28 LAB — BASIC METABOLIC PANEL
Anion gap: 9 (ref 5–15)
BUN: 18 mg/dL (ref 6–20)
CHLORIDE: 101 mmol/L (ref 101–111)
CO2: 25 mmol/L (ref 22–32)
CREATININE: 1.7 mg/dL — AB (ref 0.61–1.24)
Calcium: 8.3 mg/dL — ABNORMAL LOW (ref 8.9–10.3)
GFR calc non Af Amer: 40 mL/min — ABNORMAL LOW (ref >60)
GFR, EST AFRICAN AMERICAN: 47 mL/min — AB (ref >60)
Glucose, Bld: 182 mg/dL — ABNORMAL HIGH (ref 65–99)
POTASSIUM: 4.7 mmol/L (ref 3.5–5.1)
Sodium: 135 mmol/L (ref 135–145)

## 2017-04-28 MED ORDER — TRAMADOL HCL 50 MG PO TABS: 50.0000 mg | ORAL_TABLET | Freq: Four times a day (QID) | ORAL | 0 refills | Status: DC | PRN

## 2017-04-28 NOTE — Progress Notes (Signed)
Physical Therapy Treatment Patient Details Name: James Dougherty MRN: [id number] DOB: 09/14/1950 Today's Date: 04/28/2017    History of Present Illness Pt adm with gangrene of rt great toe and underwent amputation of rt great toe with VAC placement on 04/26/17. PMH - DM, HTN, neuropathy, arthritis, PAD, CVA    PT Comments    Pt performed gait and stair training in prep for d/c home today.  Pt able to donn darco shoe with minimal assistance to hold straps while he placed foot in shoe.  Pt required cues for safety for stair training to simulate safe entry home.  Plan next session for continued gait training with emphasis on safety.  Pt should return home this afternoon but will continue to follow if patient remains hospitalized.     Follow Up Recommendations  Home health PT;Supervision - Intermittent(HH aide)     Equipment Recommendations  None recommended by PT    Recommendations for Other Services       Precautions / Restrictions Precautions Precautions: Fall Required Braces or Orthoses: Other Brace/Splint Other Brace/Splint: Post op shoe (Darco) Restrictions Weight Bearing Restrictions: Yes RLE Weight Bearing: Weight bearing as tolerated(heel weight bearing in darco shoe.  )    Mobility  Bed Mobility Overal bed mobility: Needs Assistance Bed Mobility: Supine to Sit     Supine to sit: Supervision     General bed mobility comments: Supervision for line management. No physical assist required, increased time needed  Transfers Overall transfer level: Needs assistance Equipment used: Rolling walker (2 wheeled) Transfers: Sit to/from Stand Sit to Stand: Min guard         General transfer comment: Cues for hand placement as patient attempting to pull into standing with RW.    Ambulation/Gait Ambulation/Gait assistance: Min guard Ambulation Distance (Feet): 200 Feet Assistive device: Rolling walker (2 wheeled) Gait Pattern/deviations: Step-through pattern;Decreased  step length - right;Decreased step length - left;Decreased stance time - right Gait velocity: decr Gait velocity interpretation: Below normal speed for age/gender General Gait Details: Assist for safety and lines, cues to step closer to RW and to maintain weight bearing through R heel.     Stairs Stairs: Yes   Stair Management: No rails;Backwards;Forwards;With walker Number of Stairs: 2 General stair comments: Cues for sequencing and RW placement, min guard for safety.  Pt tolerated well.  Performed step to pattern, backwards to ascend and forward to descend.    Wheelchair Mobility    Modified Rankin (Stroke Patients Only)       Balance Overall balance assessment: Needs assistance Sitting-balance support: Feet supported;No upper extremity supported Sitting balance-Leahy Scale: Good     Standing balance support: Single extremity supported Standing balance-Leahy Scale: Fair Standing balance comment: Unsteady with static standing with only one UE support                            Cognition Arousal/Alertness: Awake/alert Behavior During Therapy: WFL for tasks assessed/performed Overall Cognitive Status: Difficult to assess                                        Exercises      General Comments        Pertinent Vitals/Pain Pain Assessment: Faces Faces Pain Scale: No hurt Pain Location: R foot Pain Descriptors / Indicators: Discomfort Pain Intervention(s): Monitored during session;Repositioned    Home  Living                      Prior Function            PT Goals (current goals can now be found in the care plan section) Acute Rehab PT Goals Patient Stated Goal: return home Potential to Achieve Goals: Good Progress towards PT goals: Progressing toward goals    Frequency    Min 3X/week      PT Plan Current plan remains appropriate    Co-evaluation              AM-PAC PT "6 Clicks" Daily Activity  Outcome  Measure  Difficulty turning over in bed (including adjusting bedclothes, sheets and blankets)?: A Little Difficulty moving from lying on back to sitting on the side of the bed? : A Little Difficulty sitting down on and standing up from a chair with arms (e.g., wheelchair, bedside commode, etc,.)?: Unable Help needed moving to and from a bed to chair (including a wheelchair)?: A Little Help needed walking in hospital room?: A Little Help needed climbing 3-5 steps with a railing? : A Little 6 Click Score: 16    End of Session Equipment Utilized During Treatment: Gait belt Activity Tolerance: Patient tolerated treatment well Patient left: with call bell/phone within reach;with family/visitor present;in bed Nurse Communication: Mobility status PT Visit Diagnosis: Unsteadiness on feet (R26.81)     Time: 6962-95281055-1113 PT Time Calculation (min) (ACUTE ONLY): 18 min  Charges:  $Gait Training: 8-22 mins                    G Codes:       Joycelyn RuaAimee Mizraim Harmening, PTA pager [phone]    Florestine AversAimee J Serah Nicoletti 04/28/2017, 11:20 AM

## 2017-04-28 NOTE — Progress Notes (Signed)
Occupational Therapy Treatment Patient Details Name: James Dougherty MRN: [id number] DOB: August 08, 1950 Today's Date: 04/28/2017    History of present illness Pt adm with gangrene of rt great toe and underwent amputation of rt great toe with VAC placement on 04/26/17. PMH - DM, HTN, neuropathy, arthritis, PAD, CVA   OT comments  Pt able to demo donning R darco shoe with set up this session. Educated on compensatory strategies for LB ADL and potentially sponge bathing initially upon return home for safety. Pt continues to require min guard assist for functional mobility due to balance deficits in standing. D/c plan remains appropriate. Will continue to follow acutely.   Follow Up Recommendations  Home health OT;Supervision - Intermittent    Equipment Recommendations  Tub/shower bench    Recommendations for Other Services      Precautions / Restrictions Precautions Precautions: Fall Required Braces or Orthoses: Other Brace/Splint Other Brace/Splint: Post op shoe (Darco) Restrictions Weight Bearing Restrictions: Yes RLE Weight Bearing: (WB through heel with darco shoe)       Mobility Bed Mobility Overal bed mobility: Needs Assistance Bed Mobility: Supine to Sit     Supine to sit: Supervision     General bed mobility comments: Supervision for line management. No physical assist required, increased time needed  Transfers Overall transfer level: Needs assistance Equipment used: Rolling walker (2 wheeled) Transfers: Sit to/from Stand Sit to Stand: Min guard         General transfer comment: Min guard due to balance deficits in standing.    Balance Overall balance assessment: Needs assistance Sitting-balance support: Feet supported;No upper extremity supported Sitting balance-Leahy Scale: Good     Standing balance support: Single extremity supported Standing balance-Leahy Scale: Poor Standing balance comment: Unsteady with static standing with only one UE support                            ADL either performed or assessed with clinical judgement   ADL Overall ADL's : Needs assistance/impaired                     Lower Body Dressing: Min guard;Sit to/from stand Lower Body Dressing Details (indicate cue type and reason): Pt able to R darco shoe with set up this session. Educated on compensatory strategies for LB ADL; pt verbalized understanding Toilet Transfer: Min IT sales professional Details (indicate cue type and reason): Pt asking if he needed to use RW at home; feel pt is too unsteady without at this time. Recommend use of RW for balance and safety with mobility       Tub/Shower Transfer Details (indicate cue type and reason): Discussed potentially sponge bathing initially until [facility] therapist comes to house to demo tub transfer; pt agreeable. Pt reports he cannot afford tub bench so will probably use 3 in 1. Functional mobility during ADLs: Min guard;Rolling walker       Vision       Perception     Praxis      Cognition Arousal/Alertness: Awake/alert Behavior During Therapy: WFL for tasks assessed/performed Overall Cognitive Status: Within Functional Limits for tasks assessed                                          Exercises     Shoulder Instructions       General Comments  Pertinent Vitals/ Pain       Pain Assessment: Faces Faces Pain Scale: Hurts a little bit Pain Location: R foot Pain Descriptors / Indicators: Discomfort Pain Intervention(s): Monitored during session;Repositioned  Home Living                                          Prior Functioning/Environment              Frequency  Min 3X/week        Progress Toward Goals  OT Goals(current goals can now be found in the care plan section)  Progress towards OT goals: Progressing toward goals  Acute Rehab OT Goals Patient Stated Goal: return home OT Goal Formulation: With patient   Plan Discharge plan remains appropriate    Co-evaluation                 AM-PAC PT "6 Clicks" Daily Activity     Outcome Measure   Help from another person eating meals?: None Help from another person taking care of personal grooming?: A Little Help from another person toileting, which includes using toliet, bedpan, or urinal?: A Little Help from another person bathing (including washing, rinsing, drying)?: A Little Help from another person to put on and taking off regular upper body clothing?: None Help from another person to put on and taking off regular lower body clothing?: A Little 6 Click Score: 20    End of Session Equipment Utilized During Treatment: Gait belt;Rolling walker  OT Visit Diagnosis: Unsteadiness on feet (R26.81);Muscle weakness (generalized) (M62.81);Pain Pain - Right/Left: Right Pain - part of body: Ankle and joints of foot   Activity Tolerance Patient tolerated treatment well   Patient Left in chair;with call bell/phone within reach   Nurse Communication          Time: [id number] OT Time Calculation (min): 15 min  Charges: OT General Charges $OT Visit: 1 Visit OT Treatments $Self Care/Home Management : 8-22 mins  Jaydn Fincher A. Brett Albinooffey, M.S., OTR/L Pager: [phone]   Gaye AlkenBailey A Dawnya Grams 04/28/2017, 10:49 AM

## 2017-04-28 NOTE — Care Management Note (Signed)
Case Management Note Donn PieriniKristi Cire Clute RN, BSN Unit 4E-Case Manager [phone]  Patient Details  Name: James SideJames F Dougherty MRN: [id number] Date of Birth: 1951/01/13  Subjective/Objective:   Pt admitted with gangrene of foot s/p Open ray amputation right great toe and VAC placement                 Action/Plan: PTA pt lived at home alone, daughter nearby to assist. CM received consult for home wound VAC- [facility] orders have been placed for HHRN/PT/OT/aide- Per PT pt safe to return home with daughter to provide intermittent supervision and assistance. [facility] wound VAC form has been signedBarista- Spoke with pt and daughter at bedside- [facility] per conversation they have used [facility] in past and [facility] RN- Ardelle BallsKathy Denny- pt reports that he has a RW and BSC already at home. [facility] needs- wound VAC form given to Lupita LeashDonna to start process for approval- once approved home wound VAC will be delivered to room and changed out at bedside [facility] to home wound VAC from hospital vac. [facility] and name of preferred home RN provided per family request. CM will continue to follow for transition to home needs for potential d/c 04/28/17  Expected Discharge Date:  04/28/17               Expected Discharge Plan:  Home w Home Health Services  In-House Referral:  NA  Discharge planning Services  CM Consult  Post Acute Care Choice:  Home Health, Durable Medical Equipment Choice offered to:  Patient, Adult Children  DME Arranged:  Vac DME Agency:  [facility]  HH Arranged:  RN, PT, OT, Nurse's Aide HH Agency:  [facility]  Status of Service:  Completed, signed off  If discussed at Long Length of Stay Meetings, dates discussed:    Discharge Disposition: home/home health   Additional Comments:  04/28/17- 1000- Donn PieriniKristi Ammara Raj RN, CM- pt for d/c home today- have  [facility] that [facility] been approved for home wound VAC however pt will have a copay cost- Lupita LeashDonna to come speak with pt at bedside regarding the copay cost - Lupita LeashDonna will then bring home VAC to room and place on pt prior to discharge home. Home drsg changes will be M/W/F- Pt does have transportation home.   Darrold SpanWebster, Cardale Dorer Hall, RN 04/28/2017, 12:04 PM

## 2017-04-28 NOTE — Discharge Summary (Signed)
Physician Discharge Summary   Patient ID: James Dougherty [id number] 67 y.o. [birth year]  Admit date: 04/26/2017  Discharge date and time: 04/28/17   Admitting Physician: Larina Earthly, MD   Discharge Physician: same  Admission Diagnoses: NON-VIABLE TISSUE RIGHT GREAT TOE  Discharge Diagnoses: PAD S/p R GT amputation  Admission Condition: fair  Discharged Condition: fair  Indication for Admission: gangrene right great toe  Hospital Course: [name] is a 67y.o. Male who came in as an outpatient for open ray amputation of right great toe with wound vac placement due to gangrene. This was performed by Dr. Arbie Cookey on 04/26/17.  He tolerated this procedure well and was admitted to the hospital.  POD#1 case management consulted for [facility] needs including home wound vac.  Therapy teams were consulted who recommended a darco shoe and HH PT/OT.  POD#2 patient is feeling fit for discharge.  As long as home wound vac is delivered today, patient will be discharged today.  He will be prescribed 2-3 days of narcotic pain medication.  He will follow up in office in 2 weeks for wound check.  Discharge instructions were reviewed with the patient and he voices his understanding.  He will be discharged this morning in stable condition.   Consults: None  Treatments: surgery by Dr. Arbie Cookey on 04/26/17: open ray amputation of right great toe and VAC placment  Discharge Exam: see progress note 04/28/17 Vitals:   04/28/17 0018 04/28/17 0432  BP: 131/86 (!) 126/52  Pulse: 87 86  Resp: 14 18  Temp: 99.3 F (37.4 C) 98.6 F (37 C)  SpO2: 100% 97%     Disposition: Discharge disposition: 01-Home or Self Care       Patient Instructions:  Allergies as of 04/28/2017      Reactions   Oxycodone Nausea And Vomiting   Glipizide    Bad headache and blurred vision   Bactrim [sulfamethoxazole-trimethoprim] Itching, Rash      Medication List    TAKE these medications   amLODipine 5 MG tablet Commonly known as:   NORVASC Take 1 tablet (5 mg total) by mouth daily.   aspirin EC 81 MG tablet Take 81 mg by mouth daily.   cephALEXin 500 MG capsule Commonly known as:  KEFLEX Take 1 capsule (500 mg total) by mouth 3 (three) times daily.   clopidogrel 75 MG tablet Commonly known as:  PLAVIX Take 1 tablet (75 mg total) by mouth daily with breakfast.   fluticasone 50 MCG/ACT nasal spray Commonly known as:  FLONASE Place 2 sprays into both nostrils daily as needed for allergies or rhinitis.   gabapentin 600 MG tablet Commonly known as:  NEURONTIN Take 600 mg by mouth every evening.   GOLD BOND FOOT Crea Apply 1 application topically 2 (two) times daily as needed (DRY SKIN). DIABETIC CREAM   lisinopril-hydrochlorothiazide 20-25 MG tablet Commonly known as:  PRINZIDE,ZESTORETIC Take 1 tablet by mouth daily.   NOVOLIN 70/30 RELION (70-30) 100 UNIT/ML injection Generic drug:  insulin NPH-regular Human Inject 10 Units into the skin 2 (two) times daily with a meal.   simvastatin 20 MG tablet Commonly known as:  ZOCOR Take 20 mg by mouth at bedtime.   traMADol 50 MG tablet Commonly known as:  ULTRAM Take 1 tablet (50 mg total) by mouth every 6 (six) hours as needed for moderate pain.   TRULICITY 0.75 MG/0.5ML Sopn Generic drug:  Dulaglutide Inject 0.75 mg into the skin once a week. Wednesdays  Activity: activity as tolerated Diet: diabetic diet Wound Care: [facility]  Follow-up with Dr. Arbie CookeyEarly in 2 weeks.  SignedEmilie Rutter: Lamyia Cdebaca 04/28/2017 8:00 AM

## 2017-04-28 NOTE — Consult Note (Signed)
WOC Nurse wound consult note Reason for Consult:Surgical wound; place NPWT prior to discharge. Wound type:Surgical Pressure Injury POA:NA Measurement:5cm x 3.8 cm x 3.4cm Wound bed:red, moist Drainage (amount, consistency, odor) small to moderate serosanguinous Periwound:intact Dressing procedure/placement/frequency: NPWT removed.  Wound cleansed with NS and surrounding tissue gently patted dry.  One pice of black foam used to obliterate dead space, skin barrier ring applied to periwound skin to enhance seal. Drape applied and system attached to NPWT device at 125mmHg continuous negative pressure.  An immediate seal is achieved.  Patient tolerated procedure well.  [facility] to continue three times weekly dressing changes until discontinued by Dr. Arbie CookeyEarly.  WOC nursing team will not follow, but will remain available to this patient, the nursing and medical teams.  Please re-consult if needed. Thanks, Ladona MowLaurie Derionna Salvador, MSN, RN, GNP, Hans EdenCWOCN, CWON-AP, FAAN  Pager# [phone]

## 2017-04-28 NOTE — Progress Notes (Signed)
Inpatient Diabetes Program Recommendations  AACE/ADA: New Consensus Statement on Inpatient Glycemic Control (2015)  Target Ranges:  Prepandial:   less than 140 mg/dL      Peak postprandial:   less than 180 mg/dL (1-2 hours)      Critically ill patients:  140 - 180 mg/dL   Results for James Dougherty, Bohdan F (MRN [id number]) as of 04/28/2017 11:17  Ref. Range 04/27/2017 06:01 04/27/2017 11:30 04/27/2017 16:28 04/27/2017 20:50 04/28/2017 06:06  Glucose-Capillary Latest Ref Range: 65 - 99 mg/dL 401187 (H) 027241 (H) 253162 (H) 190 (H) 211 (H)   Review of Glycemic Control  Diabetes history: DM2 Outpatient Diabetes medications: 70/30 10 units BID, Trulicity 0.75 mg Q week (Wednesday) Current orders for Inpatient glycemic control: Novolog 0-15 units TID with meals  Inpatient Diabetes Program Recommendations:  Correction (SSI): Please consider ordering Novolog 0-5 units QHS for bedtime correction scale. Insulin - Meal Coverage: While inpatient, please consider ordering Novolog 3 units TID with meals for meal coverage if patient eats at least 50% of meals.  Thanks, Orlando PennerMarie Revella Shelton, RN, MSN, CDE Diabetes Coordinator Inpatient Diabetes Program [phone] (Team Pager from 8am to 5pm)

## 2017-04-28 NOTE — Progress Notes (Addendum)
  Progress Note    04/28/2017 7:48 AM 2 Days Post-Op  Subjective:  Minimal pain R GT amp site   Vitals:   04/28/17 0018 04/28/17 0432  BP: 131/86 (!) 126/52  Pulse: 87 86  Resp: 14 18  Temp: 99.3 F (37.4 C) 98.6 F (37 C)  SpO2: 100% 97%   Physical Exam: Cardiac:  RRR Lungs:  Non labored Incisions:  Wound vac in place R foot Extremities:  No palpable R pedal pulses however foot is warm to touch with good capillary refill Abdomen:  Soft Neurologic: A&O  CBC    Component Value Date/Time   WBC 6.5 04/28/2017 0238   RBC 3.13 (L) 04/28/2017 0238   HGB 9.5 (L) 04/28/2017 0238   HCT 31.0 (L) 04/28/2017 0238   PLT 347 04/28/2017 0238   MCV 99.0 04/28/2017 0238   MCH 30.4 04/28/2017 0238   MCHC 30.6 04/28/2017 0238   RDW 13.0 04/28/2017 0238   LYMPHSABS 2.5 04/01/2017 1446   MONOABS 0.6 04/01/2017 1446   EOSABS 0.2 04/01/2017 1446   BASOSABS 0.1 04/01/2017 1446    BMET    Component Value Date/Time   NA 135 04/28/2017 0238   K 4.7 04/28/2017 0238   CL 101 04/28/2017 0238   CO2 25 04/28/2017 0238   GLUCOSE 182 (H) 04/28/2017 0238   BUN 18 04/28/2017 0238   CREATININE 1.70 (H) 04/28/2017 0238   CALCIUM 8.3 (L) 04/28/2017 0238   GFRNONAA 40 (L) 04/28/2017 0238   GFRAA 47 (L) 04/28/2017 0238    INR    Component Value Date/Time   INR 1.10 04/05/2017 0522     Intake/Output Summary (Last 24 hours) at 04/28/2017 0748 Last data filed at 04/27/2017 2206 Gross per 24 hour  Intake 240 ml  Output 2450 ml  Net -2210 ml     Assessment/Plan:  67 y.o. male is s/p R GT amputation with wound vac placement 2 Days Post-Op   Wound nurse consult for vac change today Home wound vac ordered HH RN, PT, OT being arranged by Case management D/C possibly today pending the above Follow up in office for wound check in 2 weeks  DVT prophylaxis:  subq heparin   Emilie RutterMatthew Eveland, PA-C [facility] [phone] 04/28/2017 7:48 AM

## 2017-04-29 ENCOUNTER — Telehealth: Payer: Self-pay | Admitting: Vascular Surgery

## 2017-04-29 DIAGNOSIS — Z95820 Peripheral vascular angioplasty status with implants and grafts: Secondary | ICD-10-CM | POA: Diagnosis not present

## 2017-04-29 DIAGNOSIS — Z79891 Long term (current) use of opiate analgesic: Secondary | ICD-10-CM | POA: Diagnosis not present

## 2017-04-29 DIAGNOSIS — E1151 Type 2 diabetes mellitus with diabetic peripheral angiopathy without gangrene: Secondary | ICD-10-CM | POA: Diagnosis not present

## 2017-04-29 DIAGNOSIS — Z7982 Long term (current) use of aspirin: Secondary | ICD-10-CM | POA: Diagnosis not present

## 2017-04-29 DIAGNOSIS — Z7902 Long term (current) use of antithrombotics/antiplatelets: Secondary | ICD-10-CM | POA: Diagnosis not present

## 2017-04-29 DIAGNOSIS — E1142 Type 2 diabetes mellitus with diabetic polyneuropathy: Secondary | ICD-10-CM | POA: Diagnosis not present

## 2017-04-29 DIAGNOSIS — E7800 Pure hypercholesterolemia, unspecified: Secondary | ICD-10-CM | POA: Diagnosis not present

## 2017-04-29 DIAGNOSIS — Z794 Long term (current) use of insulin: Secondary | ICD-10-CM | POA: Diagnosis not present

## 2017-04-29 DIAGNOSIS — R2689 Other abnormalities of gait and mobility: Secondary | ICD-10-CM | POA: Diagnosis not present

## 2017-04-29 DIAGNOSIS — Z87891 Personal history of nicotine dependence: Secondary | ICD-10-CM | POA: Diagnosis not present

## 2017-04-29 DIAGNOSIS — Z89411 Acquired absence of right great toe: Secondary | ICD-10-CM | POA: Diagnosis not present

## 2017-04-29 DIAGNOSIS — I10 Essential (primary) hypertension: Secondary | ICD-10-CM | POA: Diagnosis not present

## 2017-04-29 DIAGNOSIS — I7090 Unspecified atherosclerosis: Secondary | ICD-10-CM | POA: Diagnosis not present

## 2017-04-29 DIAGNOSIS — H9190 Unspecified hearing loss, unspecified ear: Secondary | ICD-10-CM | POA: Diagnosis not present

## 2017-04-29 DIAGNOSIS — Z8673 Personal history of transient ischemic attack (TIA), and cerebral infarction without residual deficits: Secondary | ICD-10-CM | POA: Diagnosis not present

## 2017-04-29 DIAGNOSIS — Z4781 Encounter for orthopedic aftercare following surgical amputation: Secondary | ICD-10-CM | POA: Diagnosis not present

## 2017-04-29 DIAGNOSIS — Z4801 Encounter for change or removal of surgical wound dressing: Secondary | ICD-10-CM | POA: Diagnosis not present

## 2017-04-29 NOTE — Telephone Encounter (Signed)
Spoke to pt for PA TO SEE on 4/23 [facility] lttr

## 2017-04-29 NOTE — Telephone Encounter (Signed)
-----   Message from Sharee PimpleMarilyn K McChesney, RN sent at 04/28/2017  8:12 PM EDT ----- Regarding: 2 weeks wound check toe amputation, sutures   ----- Message ----- From: Forestine NaEveland, Matthew, PA-C Sent: 04/28/2017   7:57 AM To: Vvs Charge Pool  Can you schedule an appt for this pt in 2 weeks with Dr. Arbie CookeyEarly for wound check.  PO R GT open amputation. Thanks, Dow ChemicalMatt

## 2017-04-30 DIAGNOSIS — E118 Type 2 diabetes mellitus with unspecified complications: Secondary | ICD-10-CM | POA: Diagnosis not present

## 2017-04-30 DIAGNOSIS — T8189XA Other complications of procedures, not elsewhere classified, initial encounter: Secondary | ICD-10-CM | POA: Diagnosis not present

## 2017-04-30 DIAGNOSIS — I739 Peripheral vascular disease, unspecified: Secondary | ICD-10-CM | POA: Diagnosis not present

## 2017-05-03 DIAGNOSIS — R2689 Other abnormalities of gait and mobility: Secondary | ICD-10-CM | POA: Diagnosis not present

## 2017-05-03 DIAGNOSIS — E1151 Type 2 diabetes mellitus with diabetic peripheral angiopathy without gangrene: Secondary | ICD-10-CM | POA: Diagnosis not present

## 2017-05-03 DIAGNOSIS — Z79891 Long term (current) use of opiate analgesic: Secondary | ICD-10-CM | POA: Diagnosis not present

## 2017-05-03 DIAGNOSIS — Z7982 Long term (current) use of aspirin: Secondary | ICD-10-CM | POA: Diagnosis not present

## 2017-05-03 DIAGNOSIS — H9190 Unspecified hearing loss, unspecified ear: Secondary | ICD-10-CM | POA: Diagnosis not present

## 2017-05-03 DIAGNOSIS — Z4781 Encounter for orthopedic aftercare following surgical amputation: Secondary | ICD-10-CM | POA: Diagnosis not present

## 2017-05-03 DIAGNOSIS — Z95820 Peripheral vascular angioplasty status with implants and grafts: Secondary | ICD-10-CM | POA: Diagnosis not present

## 2017-05-03 DIAGNOSIS — E1142 Type 2 diabetes mellitus with diabetic polyneuropathy: Secondary | ICD-10-CM | POA: Diagnosis not present

## 2017-05-03 DIAGNOSIS — Z794 Long term (current) use of insulin: Secondary | ICD-10-CM | POA: Diagnosis not present

## 2017-05-03 DIAGNOSIS — Z7902 Long term (current) use of antithrombotics/antiplatelets: Secondary | ICD-10-CM | POA: Diagnosis not present

## 2017-05-03 DIAGNOSIS — Z89411 Acquired absence of right great toe: Secondary | ICD-10-CM | POA: Diagnosis not present

## 2017-05-03 DIAGNOSIS — I10 Essential (primary) hypertension: Secondary | ICD-10-CM | POA: Diagnosis not present

## 2017-05-03 DIAGNOSIS — Z87891 Personal history of nicotine dependence: Secondary | ICD-10-CM | POA: Diagnosis not present

## 2017-05-03 DIAGNOSIS — E7800 Pure hypercholesterolemia, unspecified: Secondary | ICD-10-CM | POA: Diagnosis not present

## 2017-05-03 DIAGNOSIS — M6281 Muscle weakness (generalized): Secondary | ICD-10-CM | POA: Diagnosis not present

## 2017-05-03 DIAGNOSIS — I739 Peripheral vascular disease, unspecified: Secondary | ICD-10-CM | POA: Diagnosis not present

## 2017-05-03 DIAGNOSIS — T8189XA Other complications of procedures, not elsewhere classified, initial encounter: Secondary | ICD-10-CM | POA: Diagnosis not present

## 2017-05-03 DIAGNOSIS — Z4801 Encounter for change or removal of surgical wound dressing: Secondary | ICD-10-CM | POA: Diagnosis not present

## 2017-05-03 DIAGNOSIS — I96 Gangrene, not elsewhere classified: Secondary | ICD-10-CM | POA: Diagnosis not present

## 2017-05-03 DIAGNOSIS — I7090 Unspecified atherosclerosis: Secondary | ICD-10-CM | POA: Diagnosis not present

## 2017-05-03 DIAGNOSIS — Z8673 Personal history of transient ischemic attack (TIA), and cerebral infarction without residual deficits: Secondary | ICD-10-CM | POA: Diagnosis not present

## 2017-05-03 DIAGNOSIS — E118 Type 2 diabetes mellitus with unspecified complications: Secondary | ICD-10-CM | POA: Diagnosis not present

## 2017-05-04 DIAGNOSIS — I7090 Unspecified atherosclerosis: Secondary | ICD-10-CM | POA: Diagnosis not present

## 2017-05-04 DIAGNOSIS — E7800 Pure hypercholesterolemia, unspecified: Secondary | ICD-10-CM | POA: Diagnosis not present

## 2017-05-04 DIAGNOSIS — Z87891 Personal history of nicotine dependence: Secondary | ICD-10-CM | POA: Diagnosis not present

## 2017-05-04 DIAGNOSIS — Z8673 Personal history of transient ischemic attack (TIA), and cerebral infarction without residual deficits: Secondary | ICD-10-CM | POA: Diagnosis not present

## 2017-05-04 DIAGNOSIS — Z89411 Acquired absence of right great toe: Secondary | ICD-10-CM | POA: Diagnosis not present

## 2017-05-04 DIAGNOSIS — I10 Essential (primary) hypertension: Secondary | ICD-10-CM | POA: Diagnosis not present

## 2017-05-04 DIAGNOSIS — H9190 Unspecified hearing loss, unspecified ear: Secondary | ICD-10-CM | POA: Diagnosis not present

## 2017-05-04 DIAGNOSIS — Z794 Long term (current) use of insulin: Secondary | ICD-10-CM | POA: Diagnosis not present

## 2017-05-04 DIAGNOSIS — R2689 Other abnormalities of gait and mobility: Secondary | ICD-10-CM | POA: Diagnosis not present

## 2017-05-04 DIAGNOSIS — Z79891 Long term (current) use of opiate analgesic: Secondary | ICD-10-CM | POA: Diagnosis not present

## 2017-05-04 DIAGNOSIS — E1151 Type 2 diabetes mellitus with diabetic peripheral angiopathy without gangrene: Secondary | ICD-10-CM | POA: Diagnosis not present

## 2017-05-04 DIAGNOSIS — Z4801 Encounter for change or removal of surgical wound dressing: Secondary | ICD-10-CM | POA: Diagnosis not present

## 2017-05-04 DIAGNOSIS — Z7982 Long term (current) use of aspirin: Secondary | ICD-10-CM | POA: Diagnosis not present

## 2017-05-04 DIAGNOSIS — Z95820 Peripheral vascular angioplasty status with implants and grafts: Secondary | ICD-10-CM | POA: Diagnosis not present

## 2017-05-04 DIAGNOSIS — Z7902 Long term (current) use of antithrombotics/antiplatelets: Secondary | ICD-10-CM | POA: Diagnosis not present

## 2017-05-04 DIAGNOSIS — E1142 Type 2 diabetes mellitus with diabetic polyneuropathy: Secondary | ICD-10-CM | POA: Diagnosis not present

## 2017-05-04 DIAGNOSIS — Z4781 Encounter for orthopedic aftercare following surgical amputation: Secondary | ICD-10-CM | POA: Diagnosis not present

## 2017-05-05 DIAGNOSIS — Z89411 Acquired absence of right great toe: Secondary | ICD-10-CM | POA: Diagnosis not present

## 2017-05-05 DIAGNOSIS — Z4781 Encounter for orthopedic aftercare following surgical amputation: Secondary | ICD-10-CM | POA: Diagnosis not present

## 2017-05-05 DIAGNOSIS — Z7982 Long term (current) use of aspirin: Secondary | ICD-10-CM | POA: Diagnosis not present

## 2017-05-05 DIAGNOSIS — Z794 Long term (current) use of insulin: Secondary | ICD-10-CM | POA: Diagnosis not present

## 2017-05-05 DIAGNOSIS — Z7902 Long term (current) use of antithrombotics/antiplatelets: Secondary | ICD-10-CM | POA: Diagnosis not present

## 2017-05-05 DIAGNOSIS — Z95820 Peripheral vascular angioplasty status with implants and grafts: Secondary | ICD-10-CM | POA: Diagnosis not present

## 2017-05-05 DIAGNOSIS — E7800 Pure hypercholesterolemia, unspecified: Secondary | ICD-10-CM | POA: Diagnosis not present

## 2017-05-05 DIAGNOSIS — Z79891 Long term (current) use of opiate analgesic: Secondary | ICD-10-CM | POA: Diagnosis not present

## 2017-05-05 DIAGNOSIS — Z87891 Personal history of nicotine dependence: Secondary | ICD-10-CM | POA: Diagnosis not present

## 2017-05-05 DIAGNOSIS — H9190 Unspecified hearing loss, unspecified ear: Secondary | ICD-10-CM | POA: Diagnosis not present

## 2017-05-05 DIAGNOSIS — R2689 Other abnormalities of gait and mobility: Secondary | ICD-10-CM | POA: Diagnosis not present

## 2017-05-05 DIAGNOSIS — I7090 Unspecified atherosclerosis: Secondary | ICD-10-CM | POA: Diagnosis not present

## 2017-05-05 DIAGNOSIS — I10 Essential (primary) hypertension: Secondary | ICD-10-CM | POA: Diagnosis not present

## 2017-05-05 DIAGNOSIS — E1142 Type 2 diabetes mellitus with diabetic polyneuropathy: Secondary | ICD-10-CM | POA: Diagnosis not present

## 2017-05-05 DIAGNOSIS — Z4801 Encounter for change or removal of surgical wound dressing: Secondary | ICD-10-CM | POA: Diagnosis not present

## 2017-05-05 DIAGNOSIS — E1151 Type 2 diabetes mellitus with diabetic peripheral angiopathy without gangrene: Secondary | ICD-10-CM | POA: Diagnosis not present

## 2017-05-05 DIAGNOSIS — Z8673 Personal history of transient ischemic attack (TIA), and cerebral infarction without residual deficits: Secondary | ICD-10-CM | POA: Diagnosis not present

## 2017-05-06 DIAGNOSIS — E7800 Pure hypercholesterolemia, unspecified: Secondary | ICD-10-CM | POA: Diagnosis not present

## 2017-05-06 DIAGNOSIS — E1142 Type 2 diabetes mellitus with diabetic polyneuropathy: Secondary | ICD-10-CM | POA: Diagnosis not present

## 2017-05-06 DIAGNOSIS — Z8673 Personal history of transient ischemic attack (TIA), and cerebral infarction without residual deficits: Secondary | ICD-10-CM | POA: Diagnosis not present

## 2017-05-06 DIAGNOSIS — I7090 Unspecified atherosclerosis: Secondary | ICD-10-CM | POA: Diagnosis not present

## 2017-05-06 DIAGNOSIS — Z89411 Acquired absence of right great toe: Secondary | ICD-10-CM | POA: Diagnosis not present

## 2017-05-06 DIAGNOSIS — Z7982 Long term (current) use of aspirin: Secondary | ICD-10-CM | POA: Diagnosis not present

## 2017-05-06 DIAGNOSIS — Z794 Long term (current) use of insulin: Secondary | ICD-10-CM | POA: Diagnosis not present

## 2017-05-06 DIAGNOSIS — R2689 Other abnormalities of gait and mobility: Secondary | ICD-10-CM | POA: Diagnosis not present

## 2017-05-06 DIAGNOSIS — H9190 Unspecified hearing loss, unspecified ear: Secondary | ICD-10-CM | POA: Diagnosis not present

## 2017-05-06 DIAGNOSIS — E1151 Type 2 diabetes mellitus with diabetic peripheral angiopathy without gangrene: Secondary | ICD-10-CM | POA: Diagnosis not present

## 2017-05-06 DIAGNOSIS — Z79891 Long term (current) use of opiate analgesic: Secondary | ICD-10-CM | POA: Diagnosis not present

## 2017-05-06 DIAGNOSIS — Z87891 Personal history of nicotine dependence: Secondary | ICD-10-CM | POA: Diagnosis not present

## 2017-05-06 DIAGNOSIS — Z4801 Encounter for change or removal of surgical wound dressing: Secondary | ICD-10-CM | POA: Diagnosis not present

## 2017-05-06 DIAGNOSIS — Z95820 Peripheral vascular angioplasty status with implants and grafts: Secondary | ICD-10-CM | POA: Diagnosis not present

## 2017-05-06 DIAGNOSIS — I10 Essential (primary) hypertension: Secondary | ICD-10-CM | POA: Diagnosis not present

## 2017-05-06 DIAGNOSIS — E1165 Type 2 diabetes mellitus with hyperglycemia: Secondary | ICD-10-CM | POA: Diagnosis not present

## 2017-05-06 DIAGNOSIS — Z7902 Long term (current) use of antithrombotics/antiplatelets: Secondary | ICD-10-CM | POA: Diagnosis not present

## 2017-05-06 DIAGNOSIS — Z4781 Encounter for orthopedic aftercare following surgical amputation: Secondary | ICD-10-CM | POA: Diagnosis not present

## 2017-05-07 DIAGNOSIS — Z4781 Encounter for orthopedic aftercare following surgical amputation: Secondary | ICD-10-CM | POA: Diagnosis not present

## 2017-05-07 DIAGNOSIS — I7090 Unspecified atherosclerosis: Secondary | ICD-10-CM | POA: Diagnosis not present

## 2017-05-07 DIAGNOSIS — Z87891 Personal history of nicotine dependence: Secondary | ICD-10-CM | POA: Diagnosis not present

## 2017-05-07 DIAGNOSIS — Z8673 Personal history of transient ischemic attack (TIA), and cerebral infarction without residual deficits: Secondary | ICD-10-CM | POA: Diagnosis not present

## 2017-05-07 DIAGNOSIS — H9190 Unspecified hearing loss, unspecified ear: Secondary | ICD-10-CM | POA: Diagnosis not present

## 2017-05-07 DIAGNOSIS — E1151 Type 2 diabetes mellitus with diabetic peripheral angiopathy without gangrene: Secondary | ICD-10-CM | POA: Diagnosis not present

## 2017-05-07 DIAGNOSIS — Z7982 Long term (current) use of aspirin: Secondary | ICD-10-CM | POA: Diagnosis not present

## 2017-05-07 DIAGNOSIS — I10 Essential (primary) hypertension: Secondary | ICD-10-CM | POA: Diagnosis not present

## 2017-05-07 DIAGNOSIS — E1142 Type 2 diabetes mellitus with diabetic polyneuropathy: Secondary | ICD-10-CM | POA: Diagnosis not present

## 2017-05-07 DIAGNOSIS — Z95820 Peripheral vascular angioplasty status with implants and grafts: Secondary | ICD-10-CM | POA: Diagnosis not present

## 2017-05-07 DIAGNOSIS — Z7902 Long term (current) use of antithrombotics/antiplatelets: Secondary | ICD-10-CM | POA: Diagnosis not present

## 2017-05-07 DIAGNOSIS — Z79891 Long term (current) use of opiate analgesic: Secondary | ICD-10-CM | POA: Diagnosis not present

## 2017-05-07 DIAGNOSIS — R2689 Other abnormalities of gait and mobility: Secondary | ICD-10-CM | POA: Diagnosis not present

## 2017-05-07 DIAGNOSIS — Z4801 Encounter for change or removal of surgical wound dressing: Secondary | ICD-10-CM | POA: Diagnosis not present

## 2017-05-07 DIAGNOSIS — E7800 Pure hypercholesterolemia, unspecified: Secondary | ICD-10-CM | POA: Diagnosis not present

## 2017-05-07 DIAGNOSIS — Z89411 Acquired absence of right great toe: Secondary | ICD-10-CM | POA: Diagnosis not present

## 2017-05-07 DIAGNOSIS — Z794 Long term (current) use of insulin: Secondary | ICD-10-CM | POA: Diagnosis not present

## 2017-05-08 DIAGNOSIS — E1142 Type 2 diabetes mellitus with diabetic polyneuropathy: Secondary | ICD-10-CM | POA: Diagnosis not present

## 2017-05-08 DIAGNOSIS — Z8673 Personal history of transient ischemic attack (TIA), and cerebral infarction without residual deficits: Secondary | ICD-10-CM | POA: Diagnosis not present

## 2017-05-08 DIAGNOSIS — Z4781 Encounter for orthopedic aftercare following surgical amputation: Secondary | ICD-10-CM | POA: Diagnosis not present

## 2017-05-08 DIAGNOSIS — Z7902 Long term (current) use of antithrombotics/antiplatelets: Secondary | ICD-10-CM | POA: Diagnosis not present

## 2017-05-08 DIAGNOSIS — I7090 Unspecified atherosclerosis: Secondary | ICD-10-CM | POA: Diagnosis not present

## 2017-05-08 DIAGNOSIS — Z4801 Encounter for change or removal of surgical wound dressing: Secondary | ICD-10-CM | POA: Diagnosis not present

## 2017-05-08 DIAGNOSIS — H9190 Unspecified hearing loss, unspecified ear: Secondary | ICD-10-CM | POA: Diagnosis not present

## 2017-05-08 DIAGNOSIS — I10 Essential (primary) hypertension: Secondary | ICD-10-CM | POA: Diagnosis not present

## 2017-05-08 DIAGNOSIS — Z794 Long term (current) use of insulin: Secondary | ICD-10-CM | POA: Diagnosis not present

## 2017-05-08 DIAGNOSIS — Z7982 Long term (current) use of aspirin: Secondary | ICD-10-CM | POA: Diagnosis not present

## 2017-05-08 DIAGNOSIS — Z95820 Peripheral vascular angioplasty status with implants and grafts: Secondary | ICD-10-CM | POA: Diagnosis not present

## 2017-05-08 DIAGNOSIS — R2689 Other abnormalities of gait and mobility: Secondary | ICD-10-CM | POA: Diagnosis not present

## 2017-05-08 DIAGNOSIS — Z89411 Acquired absence of right great toe: Secondary | ICD-10-CM | POA: Diagnosis not present

## 2017-05-08 DIAGNOSIS — Z79891 Long term (current) use of opiate analgesic: Secondary | ICD-10-CM | POA: Diagnosis not present

## 2017-05-08 DIAGNOSIS — E1151 Type 2 diabetes mellitus with diabetic peripheral angiopathy without gangrene: Secondary | ICD-10-CM | POA: Diagnosis not present

## 2017-05-08 DIAGNOSIS — Z87891 Personal history of nicotine dependence: Secondary | ICD-10-CM | POA: Diagnosis not present

## 2017-05-08 DIAGNOSIS — E7800 Pure hypercholesterolemia, unspecified: Secondary | ICD-10-CM | POA: Diagnosis not present

## 2017-05-10 DIAGNOSIS — Z7902 Long term (current) use of antithrombotics/antiplatelets: Secondary | ICD-10-CM | POA: Diagnosis not present

## 2017-05-10 DIAGNOSIS — E1142 Type 2 diabetes mellitus with diabetic polyneuropathy: Secondary | ICD-10-CM | POA: Diagnosis not present

## 2017-05-10 DIAGNOSIS — Z7982 Long term (current) use of aspirin: Secondary | ICD-10-CM | POA: Diagnosis not present

## 2017-05-10 DIAGNOSIS — Z794 Long term (current) use of insulin: Secondary | ICD-10-CM | POA: Diagnosis not present

## 2017-05-10 DIAGNOSIS — Z89411 Acquired absence of right great toe: Secondary | ICD-10-CM | POA: Diagnosis not present

## 2017-05-10 DIAGNOSIS — Z8673 Personal history of transient ischemic attack (TIA), and cerebral infarction without residual deficits: Secondary | ICD-10-CM | POA: Diagnosis not present

## 2017-05-10 DIAGNOSIS — E7800 Pure hypercholesterolemia, unspecified: Secondary | ICD-10-CM | POA: Diagnosis not present

## 2017-05-10 DIAGNOSIS — Z79891 Long term (current) use of opiate analgesic: Secondary | ICD-10-CM | POA: Diagnosis not present

## 2017-05-10 DIAGNOSIS — Z4801 Encounter for change or removal of surgical wound dressing: Secondary | ICD-10-CM | POA: Diagnosis not present

## 2017-05-10 DIAGNOSIS — R2689 Other abnormalities of gait and mobility: Secondary | ICD-10-CM | POA: Diagnosis not present

## 2017-05-10 DIAGNOSIS — Z87891 Personal history of nicotine dependence: Secondary | ICD-10-CM | POA: Diagnosis not present

## 2017-05-10 DIAGNOSIS — E1151 Type 2 diabetes mellitus with diabetic peripheral angiopathy without gangrene: Secondary | ICD-10-CM | POA: Diagnosis not present

## 2017-05-10 DIAGNOSIS — Z95820 Peripheral vascular angioplasty status with implants and grafts: Secondary | ICD-10-CM | POA: Diagnosis not present

## 2017-05-10 DIAGNOSIS — I7090 Unspecified atherosclerosis: Secondary | ICD-10-CM | POA: Diagnosis not present

## 2017-05-10 DIAGNOSIS — Z4781 Encounter for orthopedic aftercare following surgical amputation: Secondary | ICD-10-CM | POA: Diagnosis not present

## 2017-05-10 DIAGNOSIS — H9190 Unspecified hearing loss, unspecified ear: Secondary | ICD-10-CM | POA: Diagnosis not present

## 2017-05-10 DIAGNOSIS — I10 Essential (primary) hypertension: Secondary | ICD-10-CM | POA: Diagnosis not present

## 2017-05-11 DIAGNOSIS — E7800 Pure hypercholesterolemia, unspecified: Secondary | ICD-10-CM | POA: Diagnosis not present

## 2017-05-11 DIAGNOSIS — Z8673 Personal history of transient ischemic attack (TIA), and cerebral infarction without residual deficits: Secondary | ICD-10-CM | POA: Diagnosis not present

## 2017-05-11 DIAGNOSIS — I10 Essential (primary) hypertension: Secondary | ICD-10-CM | POA: Diagnosis not present

## 2017-05-11 DIAGNOSIS — Z89411 Acquired absence of right great toe: Secondary | ICD-10-CM | POA: Diagnosis not present

## 2017-05-11 DIAGNOSIS — H9190 Unspecified hearing loss, unspecified ear: Secondary | ICD-10-CM | POA: Diagnosis not present

## 2017-05-11 DIAGNOSIS — Z7982 Long term (current) use of aspirin: Secondary | ICD-10-CM | POA: Diagnosis not present

## 2017-05-11 DIAGNOSIS — Z4781 Encounter for orthopedic aftercare following surgical amputation: Secondary | ICD-10-CM | POA: Diagnosis not present

## 2017-05-11 DIAGNOSIS — E1151 Type 2 diabetes mellitus with diabetic peripheral angiopathy without gangrene: Secondary | ICD-10-CM | POA: Diagnosis not present

## 2017-05-11 DIAGNOSIS — Z7902 Long term (current) use of antithrombotics/antiplatelets: Secondary | ICD-10-CM | POA: Diagnosis not present

## 2017-05-11 DIAGNOSIS — I7090 Unspecified atherosclerosis: Secondary | ICD-10-CM | POA: Diagnosis not present

## 2017-05-11 DIAGNOSIS — Z79891 Long term (current) use of opiate analgesic: Secondary | ICD-10-CM | POA: Diagnosis not present

## 2017-05-11 DIAGNOSIS — Z87891 Personal history of nicotine dependence: Secondary | ICD-10-CM | POA: Diagnosis not present

## 2017-05-11 DIAGNOSIS — R2689 Other abnormalities of gait and mobility: Secondary | ICD-10-CM | POA: Diagnosis not present

## 2017-05-11 DIAGNOSIS — Z95820 Peripheral vascular angioplasty status with implants and grafts: Secondary | ICD-10-CM | POA: Diagnosis not present

## 2017-05-11 DIAGNOSIS — E1142 Type 2 diabetes mellitus with diabetic polyneuropathy: Secondary | ICD-10-CM | POA: Diagnosis not present

## 2017-05-11 DIAGNOSIS — Z794 Long term (current) use of insulin: Secondary | ICD-10-CM | POA: Diagnosis not present

## 2017-05-11 DIAGNOSIS — Z4801 Encounter for change or removal of surgical wound dressing: Secondary | ICD-10-CM | POA: Diagnosis not present

## 2017-05-12 DIAGNOSIS — Z8673 Personal history of transient ischemic attack (TIA), and cerebral infarction without residual deficits: Secondary | ICD-10-CM | POA: Diagnosis not present

## 2017-05-12 DIAGNOSIS — I10 Essential (primary) hypertension: Secondary | ICD-10-CM | POA: Diagnosis not present

## 2017-05-12 DIAGNOSIS — E1151 Type 2 diabetes mellitus with diabetic peripheral angiopathy without gangrene: Secondary | ICD-10-CM | POA: Diagnosis not present

## 2017-05-12 DIAGNOSIS — Z4781 Encounter for orthopedic aftercare following surgical amputation: Secondary | ICD-10-CM | POA: Diagnosis not present

## 2017-05-12 DIAGNOSIS — Z794 Long term (current) use of insulin: Secondary | ICD-10-CM | POA: Diagnosis not present

## 2017-05-12 DIAGNOSIS — Z4801 Encounter for change or removal of surgical wound dressing: Secondary | ICD-10-CM | POA: Diagnosis not present

## 2017-05-12 DIAGNOSIS — E1142 Type 2 diabetes mellitus with diabetic polyneuropathy: Secondary | ICD-10-CM | POA: Diagnosis not present

## 2017-05-12 DIAGNOSIS — Z89411 Acquired absence of right great toe: Secondary | ICD-10-CM | POA: Diagnosis not present

## 2017-05-12 DIAGNOSIS — E7800 Pure hypercholesterolemia, unspecified: Secondary | ICD-10-CM | POA: Diagnosis not present

## 2017-05-12 DIAGNOSIS — Z95820 Peripheral vascular angioplasty status with implants and grafts: Secondary | ICD-10-CM | POA: Diagnosis not present

## 2017-05-12 DIAGNOSIS — R2689 Other abnormalities of gait and mobility: Secondary | ICD-10-CM | POA: Diagnosis not present

## 2017-05-12 DIAGNOSIS — Z79891 Long term (current) use of opiate analgesic: Secondary | ICD-10-CM | POA: Diagnosis not present

## 2017-05-12 DIAGNOSIS — Z7902 Long term (current) use of antithrombotics/antiplatelets: Secondary | ICD-10-CM | POA: Diagnosis not present

## 2017-05-12 DIAGNOSIS — I7090 Unspecified atherosclerosis: Secondary | ICD-10-CM | POA: Diagnosis not present

## 2017-05-12 DIAGNOSIS — H9190 Unspecified hearing loss, unspecified ear: Secondary | ICD-10-CM | POA: Diagnosis not present

## 2017-05-12 DIAGNOSIS — Z7982 Long term (current) use of aspirin: Secondary | ICD-10-CM | POA: Diagnosis not present

## 2017-05-12 DIAGNOSIS — Z87891 Personal history of nicotine dependence: Secondary | ICD-10-CM | POA: Diagnosis not present

## 2017-05-14 DIAGNOSIS — I7090 Unspecified atherosclerosis: Secondary | ICD-10-CM | POA: Diagnosis not present

## 2017-05-14 DIAGNOSIS — Z79891 Long term (current) use of opiate analgesic: Secondary | ICD-10-CM | POA: Diagnosis not present

## 2017-05-14 DIAGNOSIS — Z89411 Acquired absence of right great toe: Secondary | ICD-10-CM | POA: Diagnosis not present

## 2017-05-14 DIAGNOSIS — R2689 Other abnormalities of gait and mobility: Secondary | ICD-10-CM | POA: Diagnosis not present

## 2017-05-14 DIAGNOSIS — Z7982 Long term (current) use of aspirin: Secondary | ICD-10-CM | POA: Diagnosis not present

## 2017-05-14 DIAGNOSIS — Z8673 Personal history of transient ischemic attack (TIA), and cerebral infarction without residual deficits: Secondary | ICD-10-CM | POA: Diagnosis not present

## 2017-05-14 DIAGNOSIS — Z87891 Personal history of nicotine dependence: Secondary | ICD-10-CM | POA: Diagnosis not present

## 2017-05-14 DIAGNOSIS — E1142 Type 2 diabetes mellitus with diabetic polyneuropathy: Secondary | ICD-10-CM | POA: Diagnosis not present

## 2017-05-14 DIAGNOSIS — H9190 Unspecified hearing loss, unspecified ear: Secondary | ICD-10-CM | POA: Diagnosis not present

## 2017-05-14 DIAGNOSIS — I10 Essential (primary) hypertension: Secondary | ICD-10-CM | POA: Diagnosis not present

## 2017-05-14 DIAGNOSIS — Z95820 Peripheral vascular angioplasty status with implants and grafts: Secondary | ICD-10-CM | POA: Diagnosis not present

## 2017-05-14 DIAGNOSIS — E1151 Type 2 diabetes mellitus with diabetic peripheral angiopathy without gangrene: Secondary | ICD-10-CM | POA: Diagnosis not present

## 2017-05-14 DIAGNOSIS — Z4781 Encounter for orthopedic aftercare following surgical amputation: Secondary | ICD-10-CM | POA: Diagnosis not present

## 2017-05-14 DIAGNOSIS — Z4801 Encounter for change or removal of surgical wound dressing: Secondary | ICD-10-CM | POA: Diagnosis not present

## 2017-05-14 DIAGNOSIS — E7800 Pure hypercholesterolemia, unspecified: Secondary | ICD-10-CM | POA: Diagnosis not present

## 2017-05-14 DIAGNOSIS — Z7902 Long term (current) use of antithrombotics/antiplatelets: Secondary | ICD-10-CM | POA: Diagnosis not present

## 2017-05-14 DIAGNOSIS — Z794 Long term (current) use of insulin: Secondary | ICD-10-CM | POA: Diagnosis not present

## 2017-05-17 DIAGNOSIS — Z4781 Encounter for orthopedic aftercare following surgical amputation: Secondary | ICD-10-CM | POA: Diagnosis not present

## 2017-05-17 DIAGNOSIS — H9190 Unspecified hearing loss, unspecified ear: Secondary | ICD-10-CM | POA: Diagnosis not present

## 2017-05-17 DIAGNOSIS — Z95820 Peripheral vascular angioplasty status with implants and grafts: Secondary | ICD-10-CM | POA: Diagnosis not present

## 2017-05-17 DIAGNOSIS — E1142 Type 2 diabetes mellitus with diabetic polyneuropathy: Secondary | ICD-10-CM | POA: Diagnosis not present

## 2017-05-17 DIAGNOSIS — Z79891 Long term (current) use of opiate analgesic: Secondary | ICD-10-CM | POA: Diagnosis not present

## 2017-05-17 DIAGNOSIS — Z794 Long term (current) use of insulin: Secondary | ICD-10-CM | POA: Diagnosis not present

## 2017-05-17 DIAGNOSIS — I10 Essential (primary) hypertension: Secondary | ICD-10-CM | POA: Diagnosis not present

## 2017-05-17 DIAGNOSIS — Z89411 Acquired absence of right great toe: Secondary | ICD-10-CM | POA: Diagnosis not present

## 2017-05-17 DIAGNOSIS — Z7902 Long term (current) use of antithrombotics/antiplatelets: Secondary | ICD-10-CM | POA: Diagnosis not present

## 2017-05-17 DIAGNOSIS — Z8673 Personal history of transient ischemic attack (TIA), and cerebral infarction without residual deficits: Secondary | ICD-10-CM | POA: Diagnosis not present

## 2017-05-17 DIAGNOSIS — R2689 Other abnormalities of gait and mobility: Secondary | ICD-10-CM | POA: Diagnosis not present

## 2017-05-17 DIAGNOSIS — Z4801 Encounter for change or removal of surgical wound dressing: Secondary | ICD-10-CM | POA: Diagnosis not present

## 2017-05-17 DIAGNOSIS — E7800 Pure hypercholesterolemia, unspecified: Secondary | ICD-10-CM | POA: Diagnosis not present

## 2017-05-17 DIAGNOSIS — Z87891 Personal history of nicotine dependence: Secondary | ICD-10-CM | POA: Diagnosis not present

## 2017-05-17 DIAGNOSIS — Z7982 Long term (current) use of aspirin: Secondary | ICD-10-CM | POA: Diagnosis not present

## 2017-05-17 DIAGNOSIS — E1151 Type 2 diabetes mellitus with diabetic peripheral angiopathy without gangrene: Secondary | ICD-10-CM | POA: Diagnosis not present

## 2017-05-17 DIAGNOSIS — I7090 Unspecified atherosclerosis: Secondary | ICD-10-CM | POA: Diagnosis not present

## 2017-05-18 ENCOUNTER — Encounter: Payer: Self-pay | Admitting: Vascular Surgery

## 2017-05-18 ENCOUNTER — Other Ambulatory Visit: Payer: Self-pay

## 2017-05-18 ENCOUNTER — Ambulatory Visit (INDEPENDENT_AMBULATORY_CARE_PROVIDER_SITE_OTHER): Payer: Self-pay | Admitting: Vascular Surgery

## 2017-05-18 VITALS — BP 158/68 | HR 82 | Temp 97.7°F | Resp 20 | Ht 68.0 in | Wt 218.0 lb

## 2017-05-18 DIAGNOSIS — I739 Peripheral vascular disease, unspecified: Secondary | ICD-10-CM

## 2017-05-18 NOTE — Progress Notes (Signed)
    Postoperative Visit    History of Present Illness   James SideJames F Dougherty is a 67 y.o. male who presents for postoperative follow-up for: right GT ray amputation by Dr. Arbie CookeyEarly on 04/26/17.  Prior to amputation patient had right external iliac artery stenting as well as right SFA atherectomy and stenting by Dr. Darrick PennaFields in March 2019.  Following arteriogram with revascularization patient had in line flow to right foot via posterior tibial and peroneal artery.  Amputation site was left open and patient has home health RN changing wound VAC 3 times a week since discharge from hospital.  He denies rest pain or further tissue ischemia.  He is taking aspirin and Plavix daily.  He is also on a daily statin regimen.  Past medical history significant for insulin-dependent diabetes mellitus.     For VQI Use Only   PRE-ADM LIVING: Home  AMB STATUS: Ambulatory   Physical Examination   Vitals:   05/18/17 1545  BP: (!) 158/68  Pulse: 82  Resp: 20  Temp: 97.7 F (36.5 C)  SpO2: 98%    Open right great toe ray amputation with healthy wound bed with slow sanguinous oozing on today's exam; skin edges are viable; no obvious infection or frank pus noted; strong right PT signal by Doppler, faint peroneal signal by Doppler   Medical Decision Making   James SideJames F Redcay is a 67 y.o. male who presents s/p right great toe ray amputation 04/26/17.   Strong right PTA signal by Doppler suggests patent iliac and SFA stent  Amputation site appears to be healing well with no sign of infection  Continue wound VAC with home health nurse  Continue Plavix and aspirin daily  Return to office for wound check in about 4 weeks  Emilie RutterMatthew Nuchem Grattan, PA-C [facility] [place] Office: [phone]

## 2017-05-19 DIAGNOSIS — Z8673 Personal history of transient ischemic attack (TIA), and cerebral infarction without residual deficits: Secondary | ICD-10-CM | POA: Diagnosis not present

## 2017-05-19 DIAGNOSIS — Z7982 Long term (current) use of aspirin: Secondary | ICD-10-CM | POA: Diagnosis not present

## 2017-05-19 DIAGNOSIS — H9190 Unspecified hearing loss, unspecified ear: Secondary | ICD-10-CM | POA: Diagnosis not present

## 2017-05-19 DIAGNOSIS — I10 Essential (primary) hypertension: Secondary | ICD-10-CM | POA: Diagnosis not present

## 2017-05-19 DIAGNOSIS — E1142 Type 2 diabetes mellitus with diabetic polyneuropathy: Secondary | ICD-10-CM | POA: Diagnosis not present

## 2017-05-19 DIAGNOSIS — Z7902 Long term (current) use of antithrombotics/antiplatelets: Secondary | ICD-10-CM | POA: Diagnosis not present

## 2017-05-19 DIAGNOSIS — Z87891 Personal history of nicotine dependence: Secondary | ICD-10-CM | POA: Diagnosis not present

## 2017-05-19 DIAGNOSIS — I7090 Unspecified atherosclerosis: Secondary | ICD-10-CM | POA: Diagnosis not present

## 2017-05-19 DIAGNOSIS — R2689 Other abnormalities of gait and mobility: Secondary | ICD-10-CM | POA: Diagnosis not present

## 2017-05-19 DIAGNOSIS — Z79891 Long term (current) use of opiate analgesic: Secondary | ICD-10-CM | POA: Diagnosis not present

## 2017-05-19 DIAGNOSIS — Z95820 Peripheral vascular angioplasty status with implants and grafts: Secondary | ICD-10-CM | POA: Diagnosis not present

## 2017-05-19 DIAGNOSIS — E7800 Pure hypercholesterolemia, unspecified: Secondary | ICD-10-CM | POA: Diagnosis not present

## 2017-05-19 DIAGNOSIS — Z89411 Acquired absence of right great toe: Secondary | ICD-10-CM | POA: Diagnosis not present

## 2017-05-19 DIAGNOSIS — Z4781 Encounter for orthopedic aftercare following surgical amputation: Secondary | ICD-10-CM | POA: Diagnosis not present

## 2017-05-19 DIAGNOSIS — Z794 Long term (current) use of insulin: Secondary | ICD-10-CM | POA: Diagnosis not present

## 2017-05-19 DIAGNOSIS — Z4801 Encounter for change or removal of surgical wound dressing: Secondary | ICD-10-CM | POA: Diagnosis not present

## 2017-05-19 DIAGNOSIS — E1151 Type 2 diabetes mellitus with diabetic peripheral angiopathy without gangrene: Secondary | ICD-10-CM | POA: Diagnosis not present

## 2017-05-20 DIAGNOSIS — Z79891 Long term (current) use of opiate analgesic: Secondary | ICD-10-CM | POA: Diagnosis not present

## 2017-05-20 DIAGNOSIS — Z89411 Acquired absence of right great toe: Secondary | ICD-10-CM | POA: Diagnosis not present

## 2017-05-20 DIAGNOSIS — Z7902 Long term (current) use of antithrombotics/antiplatelets: Secondary | ICD-10-CM | POA: Diagnosis not present

## 2017-05-20 DIAGNOSIS — H9190 Unspecified hearing loss, unspecified ear: Secondary | ICD-10-CM | POA: Diagnosis not present

## 2017-05-20 DIAGNOSIS — I7090 Unspecified atherosclerosis: Secondary | ICD-10-CM | POA: Diagnosis not present

## 2017-05-20 DIAGNOSIS — R2689 Other abnormalities of gait and mobility: Secondary | ICD-10-CM | POA: Diagnosis not present

## 2017-05-20 DIAGNOSIS — Z87891 Personal history of nicotine dependence: Secondary | ICD-10-CM | POA: Diagnosis not present

## 2017-05-20 DIAGNOSIS — Z794 Long term (current) use of insulin: Secondary | ICD-10-CM | POA: Diagnosis not present

## 2017-05-20 DIAGNOSIS — Z4781 Encounter for orthopedic aftercare following surgical amputation: Secondary | ICD-10-CM | POA: Diagnosis not present

## 2017-05-20 DIAGNOSIS — I10 Essential (primary) hypertension: Secondary | ICD-10-CM | POA: Diagnosis not present

## 2017-05-20 DIAGNOSIS — E1142 Type 2 diabetes mellitus with diabetic polyneuropathy: Secondary | ICD-10-CM | POA: Diagnosis not present

## 2017-05-20 DIAGNOSIS — Z7982 Long term (current) use of aspirin: Secondary | ICD-10-CM | POA: Diagnosis not present

## 2017-05-20 DIAGNOSIS — Z95820 Peripheral vascular angioplasty status with implants and grafts: Secondary | ICD-10-CM | POA: Diagnosis not present

## 2017-05-20 DIAGNOSIS — E7800 Pure hypercholesterolemia, unspecified: Secondary | ICD-10-CM | POA: Diagnosis not present

## 2017-05-20 DIAGNOSIS — E1151 Type 2 diabetes mellitus with diabetic peripheral angiopathy without gangrene: Secondary | ICD-10-CM | POA: Diagnosis not present

## 2017-05-20 DIAGNOSIS — Z8673 Personal history of transient ischemic attack (TIA), and cerebral infarction without residual deficits: Secondary | ICD-10-CM | POA: Diagnosis not present

## 2017-05-20 DIAGNOSIS — Z4801 Encounter for change or removal of surgical wound dressing: Secondary | ICD-10-CM | POA: Diagnosis not present

## 2017-05-21 DIAGNOSIS — E1142 Type 2 diabetes mellitus with diabetic polyneuropathy: Secondary | ICD-10-CM | POA: Diagnosis not present

## 2017-05-21 DIAGNOSIS — E7800 Pure hypercholesterolemia, unspecified: Secondary | ICD-10-CM | POA: Diagnosis not present

## 2017-05-21 DIAGNOSIS — Z7902 Long term (current) use of antithrombotics/antiplatelets: Secondary | ICD-10-CM | POA: Diagnosis not present

## 2017-05-21 DIAGNOSIS — H9190 Unspecified hearing loss, unspecified ear: Secondary | ICD-10-CM | POA: Diagnosis not present

## 2017-05-21 DIAGNOSIS — T8189XA Other complications of procedures, not elsewhere classified, initial encounter: Secondary | ICD-10-CM | POA: Diagnosis not present

## 2017-05-21 DIAGNOSIS — I10 Essential (primary) hypertension: Secondary | ICD-10-CM | POA: Diagnosis not present

## 2017-05-21 DIAGNOSIS — I7090 Unspecified atherosclerosis: Secondary | ICD-10-CM | POA: Diagnosis not present

## 2017-05-21 DIAGNOSIS — I739 Peripheral vascular disease, unspecified: Secondary | ICD-10-CM | POA: Diagnosis not present

## 2017-05-21 DIAGNOSIS — Z89411 Acquired absence of right great toe: Secondary | ICD-10-CM | POA: Diagnosis not present

## 2017-05-21 DIAGNOSIS — Z794 Long term (current) use of insulin: Secondary | ICD-10-CM | POA: Diagnosis not present

## 2017-05-21 DIAGNOSIS — Z4801 Encounter for change or removal of surgical wound dressing: Secondary | ICD-10-CM | POA: Diagnosis not present

## 2017-05-21 DIAGNOSIS — Z95820 Peripheral vascular angioplasty status with implants and grafts: Secondary | ICD-10-CM | POA: Diagnosis not present

## 2017-05-21 DIAGNOSIS — E118 Type 2 diabetes mellitus with unspecified complications: Secondary | ICD-10-CM | POA: Diagnosis not present

## 2017-05-21 DIAGNOSIS — R2689 Other abnormalities of gait and mobility: Secondary | ICD-10-CM | POA: Diagnosis not present

## 2017-05-21 DIAGNOSIS — Z7982 Long term (current) use of aspirin: Secondary | ICD-10-CM | POA: Diagnosis not present

## 2017-05-21 DIAGNOSIS — Z4781 Encounter for orthopedic aftercare following surgical amputation: Secondary | ICD-10-CM | POA: Diagnosis not present

## 2017-05-21 DIAGNOSIS — Z87891 Personal history of nicotine dependence: Secondary | ICD-10-CM | POA: Diagnosis not present

## 2017-05-21 DIAGNOSIS — Z79891 Long term (current) use of opiate analgesic: Secondary | ICD-10-CM | POA: Diagnosis not present

## 2017-05-21 DIAGNOSIS — Z8673 Personal history of transient ischemic attack (TIA), and cerebral infarction without residual deficits: Secondary | ICD-10-CM | POA: Diagnosis not present

## 2017-05-21 DIAGNOSIS — E1151 Type 2 diabetes mellitus with diabetic peripheral angiopathy without gangrene: Secondary | ICD-10-CM | POA: Diagnosis not present

## 2017-05-24 DIAGNOSIS — Z87891 Personal history of nicotine dependence: Secondary | ICD-10-CM | POA: Diagnosis not present

## 2017-05-24 DIAGNOSIS — E1151 Type 2 diabetes mellitus with diabetic peripheral angiopathy without gangrene: Secondary | ICD-10-CM | POA: Diagnosis not present

## 2017-05-24 DIAGNOSIS — Z8673 Personal history of transient ischemic attack (TIA), and cerebral infarction without residual deficits: Secondary | ICD-10-CM | POA: Diagnosis not present

## 2017-05-24 DIAGNOSIS — Z4781 Encounter for orthopedic aftercare following surgical amputation: Secondary | ICD-10-CM | POA: Diagnosis not present

## 2017-05-24 DIAGNOSIS — Z79891 Long term (current) use of opiate analgesic: Secondary | ICD-10-CM | POA: Diagnosis not present

## 2017-05-24 DIAGNOSIS — Z95820 Peripheral vascular angioplasty status with implants and grafts: Secondary | ICD-10-CM | POA: Diagnosis not present

## 2017-05-24 DIAGNOSIS — E1142 Type 2 diabetes mellitus with diabetic polyneuropathy: Secondary | ICD-10-CM | POA: Diagnosis not present

## 2017-05-24 DIAGNOSIS — Z794 Long term (current) use of insulin: Secondary | ICD-10-CM | POA: Diagnosis not present

## 2017-05-24 DIAGNOSIS — I10 Essential (primary) hypertension: Secondary | ICD-10-CM | POA: Diagnosis not present

## 2017-05-24 DIAGNOSIS — Z4801 Encounter for change or removal of surgical wound dressing: Secondary | ICD-10-CM | POA: Diagnosis not present

## 2017-05-24 DIAGNOSIS — Z7982 Long term (current) use of aspirin: Secondary | ICD-10-CM | POA: Diagnosis not present

## 2017-05-24 DIAGNOSIS — R2689 Other abnormalities of gait and mobility: Secondary | ICD-10-CM | POA: Diagnosis not present

## 2017-05-24 DIAGNOSIS — Z89411 Acquired absence of right great toe: Secondary | ICD-10-CM | POA: Diagnosis not present

## 2017-05-24 DIAGNOSIS — E7800 Pure hypercholesterolemia, unspecified: Secondary | ICD-10-CM | POA: Diagnosis not present

## 2017-05-24 DIAGNOSIS — Z7902 Long term (current) use of antithrombotics/antiplatelets: Secondary | ICD-10-CM | POA: Diagnosis not present

## 2017-05-24 DIAGNOSIS — H9190 Unspecified hearing loss, unspecified ear: Secondary | ICD-10-CM | POA: Diagnosis not present

## 2017-05-24 DIAGNOSIS — I7090 Unspecified atherosclerosis: Secondary | ICD-10-CM | POA: Diagnosis not present

## 2017-05-26 DIAGNOSIS — Z87891 Personal history of nicotine dependence: Secondary | ICD-10-CM | POA: Diagnosis not present

## 2017-05-26 DIAGNOSIS — I7090 Unspecified atherosclerosis: Secondary | ICD-10-CM | POA: Diagnosis not present

## 2017-05-26 DIAGNOSIS — Z79891 Long term (current) use of opiate analgesic: Secondary | ICD-10-CM | POA: Diagnosis not present

## 2017-05-26 DIAGNOSIS — Z7902 Long term (current) use of antithrombotics/antiplatelets: Secondary | ICD-10-CM | POA: Diagnosis not present

## 2017-05-26 DIAGNOSIS — Z95820 Peripheral vascular angioplasty status with implants and grafts: Secondary | ICD-10-CM | POA: Diagnosis not present

## 2017-05-26 DIAGNOSIS — I10 Essential (primary) hypertension: Secondary | ICD-10-CM | POA: Diagnosis not present

## 2017-05-26 DIAGNOSIS — Z4801 Encounter for change or removal of surgical wound dressing: Secondary | ICD-10-CM | POA: Diagnosis not present

## 2017-05-26 DIAGNOSIS — E1142 Type 2 diabetes mellitus with diabetic polyneuropathy: Secondary | ICD-10-CM | POA: Diagnosis not present

## 2017-05-26 DIAGNOSIS — E7800 Pure hypercholesterolemia, unspecified: Secondary | ICD-10-CM | POA: Diagnosis not present

## 2017-05-26 DIAGNOSIS — H9190 Unspecified hearing loss, unspecified ear: Secondary | ICD-10-CM | POA: Diagnosis not present

## 2017-05-26 DIAGNOSIS — R2689 Other abnormalities of gait and mobility: Secondary | ICD-10-CM | POA: Diagnosis not present

## 2017-05-26 DIAGNOSIS — Z89411 Acquired absence of right great toe: Secondary | ICD-10-CM | POA: Diagnosis not present

## 2017-05-26 DIAGNOSIS — Z7982 Long term (current) use of aspirin: Secondary | ICD-10-CM | POA: Diagnosis not present

## 2017-05-26 DIAGNOSIS — Z8673 Personal history of transient ischemic attack (TIA), and cerebral infarction without residual deficits: Secondary | ICD-10-CM | POA: Diagnosis not present

## 2017-05-26 DIAGNOSIS — Z4781 Encounter for orthopedic aftercare following surgical amputation: Secondary | ICD-10-CM | POA: Diagnosis not present

## 2017-05-26 DIAGNOSIS — E1151 Type 2 diabetes mellitus with diabetic peripheral angiopathy without gangrene: Secondary | ICD-10-CM | POA: Diagnosis not present

## 2017-05-26 DIAGNOSIS — Z794 Long term (current) use of insulin: Secondary | ICD-10-CM | POA: Diagnosis not present

## 2017-05-28 DIAGNOSIS — E1142 Type 2 diabetes mellitus with diabetic polyneuropathy: Secondary | ICD-10-CM | POA: Diagnosis not present

## 2017-05-28 DIAGNOSIS — Z4801 Encounter for change or removal of surgical wound dressing: Secondary | ICD-10-CM | POA: Diagnosis not present

## 2017-05-28 DIAGNOSIS — Z8673 Personal history of transient ischemic attack (TIA), and cerebral infarction without residual deficits: Secondary | ICD-10-CM | POA: Diagnosis not present

## 2017-05-28 DIAGNOSIS — Z4781 Encounter for orthopedic aftercare following surgical amputation: Secondary | ICD-10-CM | POA: Diagnosis not present

## 2017-05-28 DIAGNOSIS — Z7902 Long term (current) use of antithrombotics/antiplatelets: Secondary | ICD-10-CM | POA: Diagnosis not present

## 2017-05-28 DIAGNOSIS — Z794 Long term (current) use of insulin: Secondary | ICD-10-CM | POA: Diagnosis not present

## 2017-05-28 DIAGNOSIS — Z87891 Personal history of nicotine dependence: Secondary | ICD-10-CM | POA: Diagnosis not present

## 2017-05-28 DIAGNOSIS — E1151 Type 2 diabetes mellitus with diabetic peripheral angiopathy without gangrene: Secondary | ICD-10-CM | POA: Diagnosis not present

## 2017-05-28 DIAGNOSIS — Z7982 Long term (current) use of aspirin: Secondary | ICD-10-CM | POA: Diagnosis not present

## 2017-05-28 DIAGNOSIS — R2689 Other abnormalities of gait and mobility: Secondary | ICD-10-CM | POA: Diagnosis not present

## 2017-05-28 DIAGNOSIS — H9190 Unspecified hearing loss, unspecified ear: Secondary | ICD-10-CM | POA: Diagnosis not present

## 2017-05-28 DIAGNOSIS — Z79891 Long term (current) use of opiate analgesic: Secondary | ICD-10-CM | POA: Diagnosis not present

## 2017-05-28 DIAGNOSIS — I7090 Unspecified atherosclerosis: Secondary | ICD-10-CM | POA: Diagnosis not present

## 2017-05-28 DIAGNOSIS — T8189XA Other complications of procedures, not elsewhere classified, initial encounter: Secondary | ICD-10-CM | POA: Diagnosis not present

## 2017-05-28 DIAGNOSIS — Z95820 Peripheral vascular angioplasty status with implants and grafts: Secondary | ICD-10-CM | POA: Diagnosis not present

## 2017-05-28 DIAGNOSIS — I10 Essential (primary) hypertension: Secondary | ICD-10-CM | POA: Diagnosis not present

## 2017-05-28 DIAGNOSIS — E7800 Pure hypercholesterolemia, unspecified: Secondary | ICD-10-CM | POA: Diagnosis not present

## 2017-05-28 DIAGNOSIS — Z89411 Acquired absence of right great toe: Secondary | ICD-10-CM | POA: Diagnosis not present

## 2017-05-31 DIAGNOSIS — Z95820 Peripheral vascular angioplasty status with implants and grafts: Secondary | ICD-10-CM | POA: Diagnosis not present

## 2017-05-31 DIAGNOSIS — R2689 Other abnormalities of gait and mobility: Secondary | ICD-10-CM | POA: Diagnosis not present

## 2017-05-31 DIAGNOSIS — E1142 Type 2 diabetes mellitus with diabetic polyneuropathy: Secondary | ICD-10-CM | POA: Diagnosis not present

## 2017-05-31 DIAGNOSIS — Z794 Long term (current) use of insulin: Secondary | ICD-10-CM | POA: Diagnosis not present

## 2017-05-31 DIAGNOSIS — Z4781 Encounter for orthopedic aftercare following surgical amputation: Secondary | ICD-10-CM | POA: Diagnosis not present

## 2017-05-31 DIAGNOSIS — I739 Peripheral vascular disease, unspecified: Secondary | ICD-10-CM | POA: Diagnosis not present

## 2017-05-31 DIAGNOSIS — Z87891 Personal history of nicotine dependence: Secondary | ICD-10-CM | POA: Diagnosis not present

## 2017-05-31 DIAGNOSIS — Z7902 Long term (current) use of antithrombotics/antiplatelets: Secondary | ICD-10-CM | POA: Diagnosis not present

## 2017-05-31 DIAGNOSIS — Z4801 Encounter for change or removal of surgical wound dressing: Secondary | ICD-10-CM | POA: Diagnosis not present

## 2017-05-31 DIAGNOSIS — I10 Essential (primary) hypertension: Secondary | ICD-10-CM | POA: Diagnosis not present

## 2017-05-31 DIAGNOSIS — Z0001 Encounter for general adult medical examination with abnormal findings: Secondary | ICD-10-CM | POA: Diagnosis not present

## 2017-05-31 DIAGNOSIS — E1151 Type 2 diabetes mellitus with diabetic peripheral angiopathy without gangrene: Secondary | ICD-10-CM | POA: Diagnosis not present

## 2017-05-31 DIAGNOSIS — E7800 Pure hypercholesterolemia, unspecified: Secondary | ICD-10-CM | POA: Diagnosis not present

## 2017-05-31 DIAGNOSIS — Z89411 Acquired absence of right great toe: Secondary | ICD-10-CM | POA: Diagnosis not present

## 2017-05-31 DIAGNOSIS — H9190 Unspecified hearing loss, unspecified ear: Secondary | ICD-10-CM | POA: Diagnosis not present

## 2017-05-31 DIAGNOSIS — Z7982 Long term (current) use of aspirin: Secondary | ICD-10-CM | POA: Diagnosis not present

## 2017-05-31 DIAGNOSIS — Z79891 Long term (current) use of opiate analgesic: Secondary | ICD-10-CM | POA: Diagnosis not present

## 2017-05-31 DIAGNOSIS — L409 Psoriasis, unspecified: Secondary | ICD-10-CM | POA: Diagnosis not present

## 2017-05-31 DIAGNOSIS — Z8673 Personal history of transient ischemic attack (TIA), and cerebral infarction without residual deficits: Secondary | ICD-10-CM | POA: Diagnosis not present

## 2017-05-31 DIAGNOSIS — E1140 Type 2 diabetes mellitus with diabetic neuropathy, unspecified: Secondary | ICD-10-CM | POA: Diagnosis not present

## 2017-05-31 DIAGNOSIS — I7090 Unspecified atherosclerosis: Secondary | ICD-10-CM | POA: Diagnosis not present

## 2017-06-02 DIAGNOSIS — E1142 Type 2 diabetes mellitus with diabetic polyneuropathy: Secondary | ICD-10-CM | POA: Diagnosis not present

## 2017-06-02 DIAGNOSIS — Z89411 Acquired absence of right great toe: Secondary | ICD-10-CM | POA: Diagnosis not present

## 2017-06-02 DIAGNOSIS — E7800 Pure hypercholesterolemia, unspecified: Secondary | ICD-10-CM | POA: Diagnosis not present

## 2017-06-02 DIAGNOSIS — I10 Essential (primary) hypertension: Secondary | ICD-10-CM | POA: Diagnosis not present

## 2017-06-02 DIAGNOSIS — I7090 Unspecified atherosclerosis: Secondary | ICD-10-CM | POA: Diagnosis not present

## 2017-06-02 DIAGNOSIS — Z87891 Personal history of nicotine dependence: Secondary | ICD-10-CM | POA: Diagnosis not present

## 2017-06-02 DIAGNOSIS — R2689 Other abnormalities of gait and mobility: Secondary | ICD-10-CM | POA: Diagnosis not present

## 2017-06-02 DIAGNOSIS — H9190 Unspecified hearing loss, unspecified ear: Secondary | ICD-10-CM | POA: Diagnosis not present

## 2017-06-02 DIAGNOSIS — Z79891 Long term (current) use of opiate analgesic: Secondary | ICD-10-CM | POA: Diagnosis not present

## 2017-06-02 DIAGNOSIS — Z794 Long term (current) use of insulin: Secondary | ICD-10-CM | POA: Diagnosis not present

## 2017-06-02 DIAGNOSIS — E1151 Type 2 diabetes mellitus with diabetic peripheral angiopathy without gangrene: Secondary | ICD-10-CM | POA: Diagnosis not present

## 2017-06-02 DIAGNOSIS — Z8673 Personal history of transient ischemic attack (TIA), and cerebral infarction without residual deficits: Secondary | ICD-10-CM | POA: Diagnosis not present

## 2017-06-02 DIAGNOSIS — Z95820 Peripheral vascular angioplasty status with implants and grafts: Secondary | ICD-10-CM | POA: Diagnosis not present

## 2017-06-02 DIAGNOSIS — Z7982 Long term (current) use of aspirin: Secondary | ICD-10-CM | POA: Diagnosis not present

## 2017-06-02 DIAGNOSIS — Z4781 Encounter for orthopedic aftercare following surgical amputation: Secondary | ICD-10-CM | POA: Diagnosis not present

## 2017-06-02 DIAGNOSIS — Z4801 Encounter for change or removal of surgical wound dressing: Secondary | ICD-10-CM | POA: Diagnosis not present

## 2017-06-02 DIAGNOSIS — Z7902 Long term (current) use of antithrombotics/antiplatelets: Secondary | ICD-10-CM | POA: Diagnosis not present

## 2017-06-04 DIAGNOSIS — Z7982 Long term (current) use of aspirin: Secondary | ICD-10-CM | POA: Diagnosis not present

## 2017-06-04 DIAGNOSIS — H9190 Unspecified hearing loss, unspecified ear: Secondary | ICD-10-CM | POA: Diagnosis not present

## 2017-06-04 DIAGNOSIS — Z8673 Personal history of transient ischemic attack (TIA), and cerebral infarction without residual deficits: Secondary | ICD-10-CM | POA: Diagnosis not present

## 2017-06-04 DIAGNOSIS — E1151 Type 2 diabetes mellitus with diabetic peripheral angiopathy without gangrene: Secondary | ICD-10-CM | POA: Diagnosis not present

## 2017-06-04 DIAGNOSIS — I7090 Unspecified atherosclerosis: Secondary | ICD-10-CM | POA: Diagnosis not present

## 2017-06-04 DIAGNOSIS — Z87891 Personal history of nicotine dependence: Secondary | ICD-10-CM | POA: Diagnosis not present

## 2017-06-04 DIAGNOSIS — I10 Essential (primary) hypertension: Secondary | ICD-10-CM | POA: Diagnosis not present

## 2017-06-04 DIAGNOSIS — R2689 Other abnormalities of gait and mobility: Secondary | ICD-10-CM | POA: Diagnosis not present

## 2017-06-04 DIAGNOSIS — Z4801 Encounter for change or removal of surgical wound dressing: Secondary | ICD-10-CM | POA: Diagnosis not present

## 2017-06-04 DIAGNOSIS — Z95820 Peripheral vascular angioplasty status with implants and grafts: Secondary | ICD-10-CM | POA: Diagnosis not present

## 2017-06-04 DIAGNOSIS — Z7902 Long term (current) use of antithrombotics/antiplatelets: Secondary | ICD-10-CM | POA: Diagnosis not present

## 2017-06-04 DIAGNOSIS — Z4781 Encounter for orthopedic aftercare following surgical amputation: Secondary | ICD-10-CM | POA: Diagnosis not present

## 2017-06-04 DIAGNOSIS — E1142 Type 2 diabetes mellitus with diabetic polyneuropathy: Secondary | ICD-10-CM | POA: Diagnosis not present

## 2017-06-04 DIAGNOSIS — Z79891 Long term (current) use of opiate analgesic: Secondary | ICD-10-CM | POA: Diagnosis not present

## 2017-06-04 DIAGNOSIS — Z89411 Acquired absence of right great toe: Secondary | ICD-10-CM | POA: Diagnosis not present

## 2017-06-04 DIAGNOSIS — Z794 Long term (current) use of insulin: Secondary | ICD-10-CM | POA: Diagnosis not present

## 2017-06-04 DIAGNOSIS — E7800 Pure hypercholesterolemia, unspecified: Secondary | ICD-10-CM | POA: Diagnosis not present

## 2017-06-07 DIAGNOSIS — Z4781 Encounter for orthopedic aftercare following surgical amputation: Secondary | ICD-10-CM | POA: Diagnosis not present

## 2017-06-07 DIAGNOSIS — E7800 Pure hypercholesterolemia, unspecified: Secondary | ICD-10-CM | POA: Diagnosis not present

## 2017-06-07 DIAGNOSIS — Z89411 Acquired absence of right great toe: Secondary | ICD-10-CM | POA: Diagnosis not present

## 2017-06-07 DIAGNOSIS — Z95820 Peripheral vascular angioplasty status with implants and grafts: Secondary | ICD-10-CM | POA: Diagnosis not present

## 2017-06-07 DIAGNOSIS — I7090 Unspecified atherosclerosis: Secondary | ICD-10-CM | POA: Diagnosis not present

## 2017-06-07 DIAGNOSIS — Z79891 Long term (current) use of opiate analgesic: Secondary | ICD-10-CM | POA: Diagnosis not present

## 2017-06-07 DIAGNOSIS — Z7982 Long term (current) use of aspirin: Secondary | ICD-10-CM | POA: Diagnosis not present

## 2017-06-07 DIAGNOSIS — I10 Essential (primary) hypertension: Secondary | ICD-10-CM | POA: Diagnosis not present

## 2017-06-07 DIAGNOSIS — R2689 Other abnormalities of gait and mobility: Secondary | ICD-10-CM | POA: Diagnosis not present

## 2017-06-07 DIAGNOSIS — E1142 Type 2 diabetes mellitus with diabetic polyneuropathy: Secondary | ICD-10-CM | POA: Diagnosis not present

## 2017-06-07 DIAGNOSIS — E1151 Type 2 diabetes mellitus with diabetic peripheral angiopathy without gangrene: Secondary | ICD-10-CM | POA: Diagnosis not present

## 2017-06-07 DIAGNOSIS — Z7902 Long term (current) use of antithrombotics/antiplatelets: Secondary | ICD-10-CM | POA: Diagnosis not present

## 2017-06-07 DIAGNOSIS — H9190 Unspecified hearing loss, unspecified ear: Secondary | ICD-10-CM | POA: Diagnosis not present

## 2017-06-07 DIAGNOSIS — Z4801 Encounter for change or removal of surgical wound dressing: Secondary | ICD-10-CM | POA: Diagnosis not present

## 2017-06-07 DIAGNOSIS — Z87891 Personal history of nicotine dependence: Secondary | ICD-10-CM | POA: Diagnosis not present

## 2017-06-07 DIAGNOSIS — Z8673 Personal history of transient ischemic attack (TIA), and cerebral infarction without residual deficits: Secondary | ICD-10-CM | POA: Diagnosis not present

## 2017-06-07 DIAGNOSIS — Z794 Long term (current) use of insulin: Secondary | ICD-10-CM | POA: Diagnosis not present

## 2017-06-09 DIAGNOSIS — I7090 Unspecified atherosclerosis: Secondary | ICD-10-CM | POA: Diagnosis not present

## 2017-06-09 DIAGNOSIS — I10 Essential (primary) hypertension: Secondary | ICD-10-CM | POA: Diagnosis not present

## 2017-06-09 DIAGNOSIS — Z95820 Peripheral vascular angioplasty status with implants and grafts: Secondary | ICD-10-CM | POA: Diagnosis not present

## 2017-06-09 DIAGNOSIS — Z7902 Long term (current) use of antithrombotics/antiplatelets: Secondary | ICD-10-CM | POA: Diagnosis not present

## 2017-06-09 DIAGNOSIS — Z4801 Encounter for change or removal of surgical wound dressing: Secondary | ICD-10-CM | POA: Diagnosis not present

## 2017-06-09 DIAGNOSIS — Z8673 Personal history of transient ischemic attack (TIA), and cerebral infarction without residual deficits: Secondary | ICD-10-CM | POA: Diagnosis not present

## 2017-06-09 DIAGNOSIS — Z7982 Long term (current) use of aspirin: Secondary | ICD-10-CM | POA: Diagnosis not present

## 2017-06-09 DIAGNOSIS — E1151 Type 2 diabetes mellitus with diabetic peripheral angiopathy without gangrene: Secondary | ICD-10-CM | POA: Diagnosis not present

## 2017-06-09 DIAGNOSIS — Z79891 Long term (current) use of opiate analgesic: Secondary | ICD-10-CM | POA: Diagnosis not present

## 2017-06-09 DIAGNOSIS — Z4781 Encounter for orthopedic aftercare following surgical amputation: Secondary | ICD-10-CM | POA: Diagnosis not present

## 2017-06-09 DIAGNOSIS — Z794 Long term (current) use of insulin: Secondary | ICD-10-CM | POA: Diagnosis not present

## 2017-06-09 DIAGNOSIS — H9190 Unspecified hearing loss, unspecified ear: Secondary | ICD-10-CM | POA: Diagnosis not present

## 2017-06-09 DIAGNOSIS — Z87891 Personal history of nicotine dependence: Secondary | ICD-10-CM | POA: Diagnosis not present

## 2017-06-09 DIAGNOSIS — Z89411 Acquired absence of right great toe: Secondary | ICD-10-CM | POA: Diagnosis not present

## 2017-06-09 DIAGNOSIS — E1142 Type 2 diabetes mellitus with diabetic polyneuropathy: Secondary | ICD-10-CM | POA: Diagnosis not present

## 2017-06-09 DIAGNOSIS — R2689 Other abnormalities of gait and mobility: Secondary | ICD-10-CM | POA: Diagnosis not present

## 2017-06-09 DIAGNOSIS — E7800 Pure hypercholesterolemia, unspecified: Secondary | ICD-10-CM | POA: Diagnosis not present

## 2017-06-11 DIAGNOSIS — Z87891 Personal history of nicotine dependence: Secondary | ICD-10-CM | POA: Diagnosis not present

## 2017-06-11 DIAGNOSIS — Z7902 Long term (current) use of antithrombotics/antiplatelets: Secondary | ICD-10-CM | POA: Diagnosis not present

## 2017-06-11 DIAGNOSIS — Z7982 Long term (current) use of aspirin: Secondary | ICD-10-CM | POA: Diagnosis not present

## 2017-06-11 DIAGNOSIS — Z4781 Encounter for orthopedic aftercare following surgical amputation: Secondary | ICD-10-CM | POA: Diagnosis not present

## 2017-06-11 DIAGNOSIS — Z79891 Long term (current) use of opiate analgesic: Secondary | ICD-10-CM | POA: Diagnosis not present

## 2017-06-11 DIAGNOSIS — E7800 Pure hypercholesterolemia, unspecified: Secondary | ICD-10-CM | POA: Diagnosis not present

## 2017-06-11 DIAGNOSIS — I7090 Unspecified atherosclerosis: Secondary | ICD-10-CM | POA: Diagnosis not present

## 2017-06-11 DIAGNOSIS — Z95820 Peripheral vascular angioplasty status with implants and grafts: Secondary | ICD-10-CM | POA: Diagnosis not present

## 2017-06-11 DIAGNOSIS — E1151 Type 2 diabetes mellitus with diabetic peripheral angiopathy without gangrene: Secondary | ICD-10-CM | POA: Diagnosis not present

## 2017-06-11 DIAGNOSIS — R2689 Other abnormalities of gait and mobility: Secondary | ICD-10-CM | POA: Diagnosis not present

## 2017-06-11 DIAGNOSIS — H9190 Unspecified hearing loss, unspecified ear: Secondary | ICD-10-CM | POA: Diagnosis not present

## 2017-06-11 DIAGNOSIS — E1142 Type 2 diabetes mellitus with diabetic polyneuropathy: Secondary | ICD-10-CM | POA: Diagnosis not present

## 2017-06-11 DIAGNOSIS — Z89411 Acquired absence of right great toe: Secondary | ICD-10-CM | POA: Diagnosis not present

## 2017-06-11 DIAGNOSIS — I10 Essential (primary) hypertension: Secondary | ICD-10-CM | POA: Diagnosis not present

## 2017-06-11 DIAGNOSIS — Z794 Long term (current) use of insulin: Secondary | ICD-10-CM | POA: Diagnosis not present

## 2017-06-11 DIAGNOSIS — Z8673 Personal history of transient ischemic attack (TIA), and cerebral infarction without residual deficits: Secondary | ICD-10-CM | POA: Diagnosis not present

## 2017-06-11 DIAGNOSIS — Z4801 Encounter for change or removal of surgical wound dressing: Secondary | ICD-10-CM | POA: Diagnosis not present

## 2017-06-14 DIAGNOSIS — Z89411 Acquired absence of right great toe: Secondary | ICD-10-CM | POA: Diagnosis not present

## 2017-06-14 DIAGNOSIS — R2689 Other abnormalities of gait and mobility: Secondary | ICD-10-CM | POA: Diagnosis not present

## 2017-06-14 DIAGNOSIS — Z7902 Long term (current) use of antithrombotics/antiplatelets: Secondary | ICD-10-CM | POA: Diagnosis not present

## 2017-06-14 DIAGNOSIS — Z95820 Peripheral vascular angioplasty status with implants and grafts: Secondary | ICD-10-CM | POA: Diagnosis not present

## 2017-06-14 DIAGNOSIS — Z8673 Personal history of transient ischemic attack (TIA), and cerebral infarction without residual deficits: Secondary | ICD-10-CM | POA: Diagnosis not present

## 2017-06-14 DIAGNOSIS — Z7982 Long term (current) use of aspirin: Secondary | ICD-10-CM | POA: Diagnosis not present

## 2017-06-14 DIAGNOSIS — I10 Essential (primary) hypertension: Secondary | ICD-10-CM | POA: Diagnosis not present

## 2017-06-14 DIAGNOSIS — Z4781 Encounter for orthopedic aftercare following surgical amputation: Secondary | ICD-10-CM | POA: Diagnosis not present

## 2017-06-14 DIAGNOSIS — I7090 Unspecified atherosclerosis: Secondary | ICD-10-CM | POA: Diagnosis not present

## 2017-06-14 DIAGNOSIS — Z79891 Long term (current) use of opiate analgesic: Secondary | ICD-10-CM | POA: Diagnosis not present

## 2017-06-14 DIAGNOSIS — Z794 Long term (current) use of insulin: Secondary | ICD-10-CM | POA: Diagnosis not present

## 2017-06-14 DIAGNOSIS — E1142 Type 2 diabetes mellitus with diabetic polyneuropathy: Secondary | ICD-10-CM | POA: Diagnosis not present

## 2017-06-14 DIAGNOSIS — E7800 Pure hypercholesterolemia, unspecified: Secondary | ICD-10-CM | POA: Diagnosis not present

## 2017-06-14 DIAGNOSIS — E1151 Type 2 diabetes mellitus with diabetic peripheral angiopathy without gangrene: Secondary | ICD-10-CM | POA: Diagnosis not present

## 2017-06-14 DIAGNOSIS — Z87891 Personal history of nicotine dependence: Secondary | ICD-10-CM | POA: Diagnosis not present

## 2017-06-14 DIAGNOSIS — H9190 Unspecified hearing loss, unspecified ear: Secondary | ICD-10-CM | POA: Diagnosis not present

## 2017-06-14 DIAGNOSIS — Z4801 Encounter for change or removal of surgical wound dressing: Secondary | ICD-10-CM | POA: Diagnosis not present

## 2017-06-16 ENCOUNTER — Telehealth: Payer: Self-pay | Admitting: *Deleted

## 2017-06-16 DIAGNOSIS — I7090 Unspecified atherosclerosis: Secondary | ICD-10-CM | POA: Diagnosis not present

## 2017-06-16 DIAGNOSIS — Z89411 Acquired absence of right great toe: Secondary | ICD-10-CM | POA: Diagnosis not present

## 2017-06-16 DIAGNOSIS — Z87891 Personal history of nicotine dependence: Secondary | ICD-10-CM | POA: Diagnosis not present

## 2017-06-16 DIAGNOSIS — H9190 Unspecified hearing loss, unspecified ear: Secondary | ICD-10-CM | POA: Diagnosis not present

## 2017-06-16 DIAGNOSIS — Z794 Long term (current) use of insulin: Secondary | ICD-10-CM | POA: Diagnosis not present

## 2017-06-16 DIAGNOSIS — R2689 Other abnormalities of gait and mobility: Secondary | ICD-10-CM | POA: Diagnosis not present

## 2017-06-16 DIAGNOSIS — E1151 Type 2 diabetes mellitus with diabetic peripheral angiopathy without gangrene: Secondary | ICD-10-CM | POA: Diagnosis not present

## 2017-06-16 DIAGNOSIS — Z7982 Long term (current) use of aspirin: Secondary | ICD-10-CM | POA: Diagnosis not present

## 2017-06-16 DIAGNOSIS — Z79891 Long term (current) use of opiate analgesic: Secondary | ICD-10-CM | POA: Diagnosis not present

## 2017-06-16 DIAGNOSIS — E1142 Type 2 diabetes mellitus with diabetic polyneuropathy: Secondary | ICD-10-CM | POA: Diagnosis not present

## 2017-06-16 DIAGNOSIS — Z7902 Long term (current) use of antithrombotics/antiplatelets: Secondary | ICD-10-CM | POA: Diagnosis not present

## 2017-06-16 DIAGNOSIS — Z95820 Peripheral vascular angioplasty status with implants and grafts: Secondary | ICD-10-CM | POA: Diagnosis not present

## 2017-06-16 DIAGNOSIS — Z4781 Encounter for orthopedic aftercare following surgical amputation: Secondary | ICD-10-CM | POA: Diagnosis not present

## 2017-06-16 DIAGNOSIS — Z4801 Encounter for change or removal of surgical wound dressing: Secondary | ICD-10-CM | POA: Diagnosis not present

## 2017-06-16 DIAGNOSIS — I10 Essential (primary) hypertension: Secondary | ICD-10-CM | POA: Diagnosis not present

## 2017-06-16 DIAGNOSIS — E7800 Pure hypercholesterolemia, unspecified: Secondary | ICD-10-CM | POA: Diagnosis not present

## 2017-06-16 DIAGNOSIS — Z8673 Personal history of transient ischemic attack (TIA), and cerebral infarction without residual deficits: Secondary | ICD-10-CM | POA: Diagnosis not present

## 2017-06-16 NOTE — Telephone Encounter (Signed)
James Dougherty, nurse with [facility] called stating thatr she had removed wound vac today due to closure of wound.. 1cm x0.3 cm with very little depth.  She placed normal saline wet to dry dressing and instructed patient on performing dressing.

## 2017-06-18 DIAGNOSIS — Z89411 Acquired absence of right great toe: Secondary | ICD-10-CM | POA: Diagnosis not present

## 2017-06-18 DIAGNOSIS — Z8673 Personal history of transient ischemic attack (TIA), and cerebral infarction without residual deficits: Secondary | ICD-10-CM | POA: Diagnosis not present

## 2017-06-18 DIAGNOSIS — Z4801 Encounter for change or removal of surgical wound dressing: Secondary | ICD-10-CM | POA: Diagnosis not present

## 2017-06-18 DIAGNOSIS — Z95820 Peripheral vascular angioplasty status with implants and grafts: Secondary | ICD-10-CM | POA: Diagnosis not present

## 2017-06-18 DIAGNOSIS — E1142 Type 2 diabetes mellitus with diabetic polyneuropathy: Secondary | ICD-10-CM | POA: Diagnosis not present

## 2017-06-18 DIAGNOSIS — E1151 Type 2 diabetes mellitus with diabetic peripheral angiopathy without gangrene: Secondary | ICD-10-CM | POA: Diagnosis not present

## 2017-06-18 DIAGNOSIS — I7090 Unspecified atherosclerosis: Secondary | ICD-10-CM | POA: Diagnosis not present

## 2017-06-18 DIAGNOSIS — Z7982 Long term (current) use of aspirin: Secondary | ICD-10-CM | POA: Diagnosis not present

## 2017-06-18 DIAGNOSIS — Z7902 Long term (current) use of antithrombotics/antiplatelets: Secondary | ICD-10-CM | POA: Diagnosis not present

## 2017-06-18 DIAGNOSIS — R2689 Other abnormalities of gait and mobility: Secondary | ICD-10-CM | POA: Diagnosis not present

## 2017-06-18 DIAGNOSIS — Z4781 Encounter for orthopedic aftercare following surgical amputation: Secondary | ICD-10-CM | POA: Diagnosis not present

## 2017-06-18 DIAGNOSIS — I10 Essential (primary) hypertension: Secondary | ICD-10-CM | POA: Diagnosis not present

## 2017-06-18 DIAGNOSIS — H9190 Unspecified hearing loss, unspecified ear: Secondary | ICD-10-CM | POA: Diagnosis not present

## 2017-06-18 DIAGNOSIS — Z794 Long term (current) use of insulin: Secondary | ICD-10-CM | POA: Diagnosis not present

## 2017-06-18 DIAGNOSIS — Z79891 Long term (current) use of opiate analgesic: Secondary | ICD-10-CM | POA: Diagnosis not present

## 2017-06-18 DIAGNOSIS — Z87891 Personal history of nicotine dependence: Secondary | ICD-10-CM | POA: Diagnosis not present

## 2017-06-18 DIAGNOSIS — E7800 Pure hypercholesterolemia, unspecified: Secondary | ICD-10-CM | POA: Diagnosis not present

## 2017-06-22 ENCOUNTER — Ambulatory Visit: Payer: Medicare Other | Admitting: Vascular Surgery

## 2017-06-24 DIAGNOSIS — Z794 Long term (current) use of insulin: Secondary | ICD-10-CM | POA: Diagnosis not present

## 2017-06-24 DIAGNOSIS — Z4801 Encounter for change or removal of surgical wound dressing: Secondary | ICD-10-CM | POA: Diagnosis not present

## 2017-06-24 DIAGNOSIS — Z95820 Peripheral vascular angioplasty status with implants and grafts: Secondary | ICD-10-CM | POA: Diagnosis not present

## 2017-06-24 DIAGNOSIS — E1151 Type 2 diabetes mellitus with diabetic peripheral angiopathy without gangrene: Secondary | ICD-10-CM | POA: Diagnosis not present

## 2017-06-24 DIAGNOSIS — Z7902 Long term (current) use of antithrombotics/antiplatelets: Secondary | ICD-10-CM | POA: Diagnosis not present

## 2017-06-24 DIAGNOSIS — Z8673 Personal history of transient ischemic attack (TIA), and cerebral infarction without residual deficits: Secondary | ICD-10-CM | POA: Diagnosis not present

## 2017-06-24 DIAGNOSIS — R2689 Other abnormalities of gait and mobility: Secondary | ICD-10-CM | POA: Diagnosis not present

## 2017-06-24 DIAGNOSIS — Z87891 Personal history of nicotine dependence: Secondary | ICD-10-CM | POA: Diagnosis not present

## 2017-06-24 DIAGNOSIS — Z4781 Encounter for orthopedic aftercare following surgical amputation: Secondary | ICD-10-CM | POA: Diagnosis not present

## 2017-06-24 DIAGNOSIS — Z79891 Long term (current) use of opiate analgesic: Secondary | ICD-10-CM | POA: Diagnosis not present

## 2017-06-24 DIAGNOSIS — I10 Essential (primary) hypertension: Secondary | ICD-10-CM | POA: Diagnosis not present

## 2017-06-24 DIAGNOSIS — H9190 Unspecified hearing loss, unspecified ear: Secondary | ICD-10-CM | POA: Diagnosis not present

## 2017-06-24 DIAGNOSIS — Z89411 Acquired absence of right great toe: Secondary | ICD-10-CM | POA: Diagnosis not present

## 2017-06-24 DIAGNOSIS — Z7982 Long term (current) use of aspirin: Secondary | ICD-10-CM | POA: Diagnosis not present

## 2017-06-24 DIAGNOSIS — E7800 Pure hypercholesterolemia, unspecified: Secondary | ICD-10-CM | POA: Diagnosis not present

## 2017-06-24 DIAGNOSIS — I7090 Unspecified atherosclerosis: Secondary | ICD-10-CM | POA: Diagnosis not present

## 2017-06-24 DIAGNOSIS — E1142 Type 2 diabetes mellitus with diabetic polyneuropathy: Secondary | ICD-10-CM | POA: Diagnosis not present

## 2017-06-25 ENCOUNTER — Ambulatory Visit (INDEPENDENT_AMBULATORY_CARE_PROVIDER_SITE_OTHER): Payer: Self-pay | Admitting: Vascular Surgery

## 2017-06-25 ENCOUNTER — Other Ambulatory Visit: Payer: Self-pay

## 2017-06-25 ENCOUNTER — Encounter: Payer: Self-pay | Admitting: Vascular Surgery

## 2017-06-25 VITALS — BP 136/70 | HR 82 | Temp 97.5°F | Resp 16 | Ht 68.0 in | Wt 221.0 lb

## 2017-06-25 DIAGNOSIS — I739 Peripheral vascular disease, unspecified: Secondary | ICD-10-CM

## 2017-06-25 DIAGNOSIS — I96 Gangrene, not elsewhere classified: Secondary | ICD-10-CM

## 2017-06-25 NOTE — Progress Notes (Signed)
   Patient name: James Dougherty MRN: [id number] DOB: 07/02/1950 Sex: male  REASON FOR VISIT: Follow-up open rotation of right great toe from 04/26/2017  HPI: James Dougherty is a 67 y.o. male here today for follow-up.  He has been released by home health nurse as of yesterday.  He has had a very nice healing of his toe.  Current Outpatient Medications  Medication Sig Dispense Refill  . amLODipine (NORVASC) 5 MG tablet Take 1 tablet (5 mg total) by mouth daily. 30 tablet 0  . aspirin EC 81 MG tablet Take 81 mg by mouth daily.    . clopidogrel (PLAVIX) 75 MG tablet Take 1 tablet (75 mg total) by mouth daily with breakfast. 30 tablet 0  . Dulaglutide (TRULICITY) 0.75 MG/0.5ML SOPN Inject 0.75 mg into the skin once a week. Wednesdays    . fluticasone (FLONASE) 50 MCG/ACT nasal spray Place 2 sprays into both nostrils daily as needed for allergies or rhinitis.    [name] gabapentin (NEURONTIN) 600 MG tablet Take 600 mg by mouth every evening.     [name] lisinopril-hydrochlorothiazide (PRINZIDE,ZESTORETIC) 20-25 MG tablet Take 1 tablet by mouth daily.    [name] NOVOLIN 70/30 RELION (70-30) 100 UNIT/ML injection Inject 10 Units into the skin 2 (two) times daily with a meal.   12  . Podiatric Products (GOLD BOND FOOT) CREA Apply 1 application topically 2 (two) times daily as needed (DRY SKIN). DIABETIC CREAM    . simvastatin (ZOCOR) 20 MG tablet Take 20 mg by mouth at bedtime.     [name] lisinopril (PRINIVIL,ZESTRIL) 10 MG tablet      No current facility-administered medications for this visit.      PHYSICAL EXAM: Vitals:   06/25/17 1333 06/25/17 1338  BP: (!) 154/74 136/70  Pulse: 82 82  Resp: 16   Temp: (!) 97.5 F (36.4 C)   TempSrc: Oral   SpO2: 98%   Weight: 221 lb (100.2 kg)   Height:  (1.727 m)     GENERAL: The patient is a well-nourished male, in no acute distress. The vital signs are documented above. 1 very small area of granulation tissue left at his open toe  amputation which is clean.  No surrounding erythema.  He does have an easily palpable popliteal pulse.  MEDICAL ISSUES: Stable status post right iliac and SFA intervention with Dr. 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  This is now completely healed.  He will see [place] again in July as scheduled for ABIs and right leg duplex and see Sharla Kidney, MD [facility] Tel [phone] Pager [phone]

## 2017-06-25 NOTE — Progress Notes (Signed)
Vitals:   06/25/17 1333  BP: (!) 154/74  Pulse: 82  Resp: 16  Temp: (!) 97.5 F (36.4 C)  TempSrc: Oral  SpO2: 98%  Weight: 221 lb (100.2 kg)  Height:  (1.727 m)

## 2017-06-30 DIAGNOSIS — M79672 Pain in left foot: Secondary | ICD-10-CM | POA: Diagnosis not present

## 2017-06-30 DIAGNOSIS — M79671 Pain in right foot: Secondary | ICD-10-CM | POA: Diagnosis not present

## 2017-06-30 DIAGNOSIS — B351 Tinea unguium: Secondary | ICD-10-CM | POA: Diagnosis not present

## 2017-06-30 DIAGNOSIS — E1151 Type 2 diabetes mellitus with diabetic peripheral angiopathy without gangrene: Secondary | ICD-10-CM | POA: Diagnosis not present

## 2017-07-27 ENCOUNTER — Encounter (HOSPITAL_COMMUNITY): Payer: Medicare Other

## 2017-07-27 ENCOUNTER — Ambulatory Visit: Payer: Medicare Other | Admitting: Family

## 2017-07-28 ENCOUNTER — Other Ambulatory Visit: Payer: Self-pay | Admitting: Pharmacy Technician

## 2017-07-28 ENCOUNTER — Other Ambulatory Visit: Payer: Self-pay | Admitting: Pharmacist

## 2017-07-28 NOTE — Patient Outreach (Signed)
[facility] Care Management  07/28/2017  James Dougherty [birth year] [id number]   Received [name] patient assistance referral [facility] Duanne MoronElisabeth Dhalla for Trulicity. Prepared patient portion to be mailed and faxed provider portion to Dr. Dimas AguasHoward on 07/03.  Will follow up with patient in 5-7 business days to confirm application has been received.  Suzan SlickAshley N. Ernesta Ambleoleman, CPhT [facility] Care Management [phone]

## 2017-07-28 NOTE — Patient Outreach (Signed)
[facility] ([facility] Care Management  07/28/2017  Baldomero F Schoch 05/30/1950 [id number]   Incoming call from Jiovani F Ryans in response to the EMMI Medication Adherence Campaign. Speak with patient. HIPAA identifiers verified and verbal consent received.  Mr. Valade reports that he takes his Novolin 70-30 twice daily as directed and that he has been taking his Trulicity once weekly on Wednesdays as directed. However, reports that he just went to pick up his next refill of Trulicity from his pharmacy and was informed that he has entered the coverage gap of his insurance and that this refill would cost him over $200. Mr. Hautala reports that this is not affordable to him. Patient reports that he has two doses of Trulicity remaining, one for today and another for next Wednesday. Discuss with patient the requirements of the Trulicity Patient Assistance Program through [name]. Mr. Ames reports that he meets the income requirement, but is not sure if he has yet met the out of pocket requirement. Patient states that he will go to his pharmacy to pick up his out of pocket expenditure report for the calendar year and asks that we go ahead and send him the application for the assistance program.   Counsel Mr. Troublefield to follow up with his PCP office both to request samples, if the office has any available, of the Trulicity and to let his PCP know that he is applying for this program, but will be out of Trulicity after next Wednesday. Mr. Raether reports that he has an appointment to see his PCP in two weeks, but that he will also call today. Counsel patient to continue to monitor and record his blood sugars as directed by his PCP and to be sure to bring this record with him to his office visit.  Mr. Gluth denies any further medication questions/concerns at this time. Provide patient with my phone number.  PLAN  1) Mr. Hargens to obtain an out of pocket expense report for the calendar year  from his pharmacy.  2) Mr. Lauderback to contact his PCP office regarding samples and his diabetes medication regimen.  3) I will send an InBasket message to [facility] Pharmacy Technician Ashley Coleman to ask her to assist the patient with applying for Patient Assistance for Trulicity from [name].  4) Will follow with Ashley as she assists Mr. Medeiros with the Patient Assistance Application.  [name], PharmD, BCACP Clinical Pharmacist [facility] Care Management [phone]  

## 2017-07-30 ENCOUNTER — Encounter (HOSPITAL_COMMUNITY): Payer: Medicare Other

## 2017-07-30 ENCOUNTER — Ambulatory Visit: Payer: Medicare Other | Admitting: Family

## 2017-08-05 ENCOUNTER — Other Ambulatory Visit: Payer: Self-pay | Admitting: Pharmacist

## 2017-08-05 NOTE — Patient Outreach (Addendum)
[facility] Care Management  08/05/2017  Sonia SideJames F Cheatwood January 02, 1951 [id number]   Charting from 08/04/2017:   Receive a voicemail from [name] requesting a call back. Called and spoke with patient. HIPAA identifiers verified and verbal consent received.  [name] reports that he received two sample Trulicity pens from his PCP office and that he went and picked up an out of pocket expense report for the calendar year from his pharmacy. Reports that he has not yet received the application in the mail, but that he will check again when he gets home this evening.   [name] denies any further medication questions/concerns at this time.   PLAN  [facility] will call to follow up with Mr. Purnell next week to insure that he received the application, if we have not heard back from him before that time.  Duanne MoronElisabeth Raquon Milledge, PharmD, [facility] LLC[facility]ACP Clinical Pharmacist [facility] Care Management [phone]

## 2017-08-06 ENCOUNTER — Encounter: Payer: Self-pay | Admitting: Pharmacy Technician

## 2017-08-06 ENCOUNTER — Other Ambulatory Visit: Payer: Self-pay | Admitting: Pharmacy Technician

## 2017-08-06 NOTE — Patient Outreach (Signed)
[place] [facility]) Care Management  08/06/2017  James Dougherty 1950/09/30 [id number]   Incoming call from James Dougherty. James Dougherty states that he received the [employer] patient assistance application that was mailed out to him. He states that he has not met the $1,100 OOP requirement for [employer]. Informed him that I will mail him out an additional letter requesting to waive the OOP spend amount for him to sign.  Will follow up with patient in 7-10 business days  Maud Deed. [place], [place] Management [phone]

## 2017-08-09 ENCOUNTER — Encounter: Payer: Self-pay | Admitting: Pharmacy Technician

## 2017-08-09 ENCOUNTER — Other Ambulatory Visit: Payer: Self-pay | Admitting: Pharmacy Technician

## 2017-08-09 NOTE — Patient Outreach (Signed)
[facility]) Care Management  08/09/2017  Sonia SideJames F Cadenhead 06-29-1950 [id number]   Received patient portion of [employer] application for Trulicity. Faxed completed application and required documents into [employer]. Once patient has mailed back the OOP spending waiver, I will submit to company.  Will follow up with company in 7-10 business days.  Suzan SlickAshley N. Ernesta Ambleoleman, CPhT [facility] Care Management [phone]

## 2017-08-11 DIAGNOSIS — I10 Essential (primary) hypertension: Secondary | ICD-10-CM | POA: Diagnosis not present

## 2017-08-11 DIAGNOSIS — E1165 Type 2 diabetes mellitus with hyperglycemia: Secondary | ICD-10-CM | POA: Diagnosis not present

## 2017-08-11 DIAGNOSIS — E7800 Pure hypercholesterolemia, unspecified: Secondary | ICD-10-CM | POA: Diagnosis not present

## 2017-08-12 ENCOUNTER — Other Ambulatory Visit: Payer: Self-pay

## 2017-08-12 NOTE — Patient Outreach (Signed)
[facility]) Care Management  08/12/2017  James SideJames F Dougherty 04-29-1950 [id number]   Medication Adherence call to Mr. Abbey ChattersJames Dougherty left a message for patient to call back patient is due on Trulicity.Mrs. James Dougherty is showing past due under [employer].   James AbedAna Dougherty CPhT Pharmacy Technician [facility] Direct Dial [phone]  Fax [phone] [email or web address]

## 2017-08-14 DIAGNOSIS — I739 Peripheral vascular disease, unspecified: Secondary | ICD-10-CM | POA: Diagnosis not present

## 2017-08-14 DIAGNOSIS — E1165 Type 2 diabetes mellitus with hyperglycemia: Secondary | ICD-10-CM | POA: Diagnosis not present

## 2017-08-14 DIAGNOSIS — I10 Essential (primary) hypertension: Secondary | ICD-10-CM | POA: Diagnosis not present

## 2017-08-14 DIAGNOSIS — L409 Psoriasis, unspecified: Secondary | ICD-10-CM | POA: Diagnosis not present

## 2017-08-14 DIAGNOSIS — E1140 Type 2 diabetes mellitus with diabetic neuropathy, unspecified: Secondary | ICD-10-CM | POA: Diagnosis not present

## 2017-08-17 ENCOUNTER — Other Ambulatory Visit: Payer: Self-pay | Admitting: Pharmacy Technician

## 2017-08-17 NOTE — Patient Outreach (Signed)
See note charted on 08/18/2017.  Meha Vidrine N. Alexa Blish, CPhT [facility] Care Management [phone]     

## 2017-08-18 NOTE — Patient Outreach (Signed)
[facility]) Care Management  08/18/2017  Sonia SideJames F Honse 01-Nov-1950 [id number]   Received [name] letter of hardship letter from Mr. Jordahl, faxed it into [employer].  Will follow up in 5-7 business days to check status of applications.  Suzan SlickAshley N. Ernesta Ambleoleman, CPhT [facility] Care Management [phone]

## 2017-08-20 ENCOUNTER — Other Ambulatory Visit: Payer: Self-pay | Admitting: Pharmacy Technician

## 2017-08-20 NOTE — Patient Outreach (Signed)
[facility] Care Management  08/20/2017  Sonia SideJames F Hickson October 16, 1950 [id number]   Follow up call to [name] to check application status of patients Trulicity. Spoke to [place] who stated patient has not spent the required OOP amount. Informed Barbara CowerJason that Letter of Hardship letter was faxed in 7/23. Barbara CowerJason was able to recover fax and attached to application for reprocessing.  Will follow up with [employer] in 5-7 business days.  Suzan SlickAshley N. Ernesta Ambleoleman, CPhT [facility] Care Management [phone]

## 2017-08-24 ENCOUNTER — Other Ambulatory Visit: Payer: Self-pay

## 2017-08-24 ENCOUNTER — Ambulatory Visit (INDEPENDENT_AMBULATORY_CARE_PROVIDER_SITE_OTHER): Payer: Medicare Other | Admitting: Family

## 2017-08-24 ENCOUNTER — Ambulatory Visit (HOSPITAL_COMMUNITY)
Admission: RE | Admit: 2017-08-24 | Discharge: 2017-08-24 | Disposition: A | Discharge status: Home / Self Care | Payer: Medicare Other | Source: Ambulatory Visit | Attending: Family | Admitting: Family

## 2017-08-24 ENCOUNTER — Ambulatory Visit (INDEPENDENT_AMBULATORY_CARE_PROVIDER_SITE_OTHER)
Admission: RE | Admit: 2017-08-24 | Discharge: 2017-08-24 | Disposition: A | Discharge status: Home / Self Care | Payer: Medicare Other | Source: Ambulatory Visit | Attending: Family | Admitting: Family

## 2017-08-24 ENCOUNTER — Encounter: Payer: Self-pay | Admitting: Family

## 2017-08-24 VITALS — BP 130/62 | HR 63 | Temp 97.0°F | Resp 20 | Ht 68.0 in | Wt 225.0 lb

## 2017-08-24 DIAGNOSIS — E1151 Type 2 diabetes mellitus with diabetic peripheral angiopathy without gangrene: Secondary | ICD-10-CM | POA: Insufficient documentation

## 2017-08-24 DIAGNOSIS — I739 Peripheral vascular disease, unspecified: Secondary | ICD-10-CM

## 2017-08-24 DIAGNOSIS — Z959 Presence of cardiac and vascular implant and graft, unspecified: Secondary | ICD-10-CM | POA: Insufficient documentation

## 2017-08-24 DIAGNOSIS — Z95828 Presence of other vascular implants and grafts: Secondary | ICD-10-CM | POA: Diagnosis not present

## 2017-08-24 DIAGNOSIS — I70213 Atherosclerosis of native arteries of extremities with intermittent claudication, bilateral legs: Secondary | ICD-10-CM | POA: Diagnosis not present

## 2017-08-24 DIAGNOSIS — I6523 Occlusion and stenosis of bilateral carotid arteries: Secondary | ICD-10-CM | POA: Insufficient documentation

## 2017-08-24 DIAGNOSIS — Z89411 Acquired absence of right great toe: Secondary | ICD-10-CM | POA: Insufficient documentation

## 2017-08-24 NOTE — Patient Instructions (Addendum)
Stroke Prevention Some health problems and behaviors may make it more likely for you to have a stroke. Below are ways to lessen your risk of having a stroke.  Be active for at least 30 minutes on most or all days.  Do not smoke. Try not to be around others who smoke.  Do not drink too much alcohol. ? Do not have more than 2 drinks a day if you are a man. ? Do not have more than 1 drink a day if you are a woman and are not pregnant.  Eat healthy foods, such as fruits and vegetables. If you were put on a specific diet, follow the diet as told.  Keep your cholesterol levels under control through diet and medicines. Look for foods that are low in saturated fat, trans fat, cholesterol, and are high in fiber.  If you have diabetes, follow all diet plans and take your medicine as told.  Ask your doctor if you need treatment to lower your blood pressure. If you have high blood pressure (hypertension), follow all diet plans and take your medicine as told by your doctor.  If you are 18-39 years old, have your blood pressure checked every 3-5 years. If you are age 40 or older, have your blood pressure checked every year.  Keep a healthy weight. Eat foods that are low in calories, salt, saturated fat, trans fat, and cholesterol.  Do not take drugs.  Avoid birth control pills, if this applies. Talk to your doctor about the risks of taking birth control pills.  Talk to your doctor if you have sleep problems (sleep apnea).  Take all medicine as told by your doctor. ? You may be told to take aspirin or blood thinner medicine. Take this medicine as told by your doctor. ? Understand your medicine instructions.  Make sure any other conditions you have are being taken care of.  Get help right away if:  You suddenly lose feeling (you feel numb) or have weakness in your face, arm, or leg.  Your face or eyelid hangs down to one side.  You suddenly feel confused.  You have trouble talking  (aphasia) or understanding what people are saying.  You suddenly have trouble seeing in one or both eyes.  You suddenly have trouble walking.  You are dizzy.  You lose your balance or your movements are clumsy (uncoordinated).  You suddenly have a very bad headache and you do not know the cause.  You have new chest pain.  Your heart feels like it is fluttering or skipping a beat (irregular heartbeat). Do not wait to see if the symptoms above go away. Get help right away. Call your local emergency services (911 in [place] Do not drive yourself to the hospital. This information is not intended to replace advice given to you by your health care provider. Make sure you discuss any questions you have with your health care provider. Document Released: 07/14/2011 Document Revised: 06/20/2015 Document Reviewed: 07/15/2012 Elsevier Interactive Patient Education  2018 Elsevier Inc.     Peripheral Vascular Disease Peripheral vascular disease (PVD) is a disease of the blood vessels that are not part of your heart and brain. A simple term for PVD is poor circulation. In most cases, PVD narrows the blood vessels that carry blood from your heart to the rest of your body. This can result in a decreased supply of blood to your arms, legs, and internal organs, like your stomach or kidneys. However, it most often affects   a person's lower legs and feet. There are two types of PVD.  Organic PVD. This is the more common type. It is caused by damage to the structure of blood vessels.  Functional PVD. This is caused by conditions that make blood vessels contract and tighten (spasm).  Without treatment, PVD tends to get worse over time. PVD can also lead to acute ischemic limb. This is when an arm or limb suddenly has trouble getting enough blood. This is a medical emergency. Follow these instructions at home:  Take medicines only as told by your doctor.  Do not use any tobacco products, including  cigarettes, chewing tobacco, or electronic cigarettes. If you need help quitting, ask your doctor.  Lose weight if you are overweight, and maintain a healthy weight as told by your doctor.  Eat a diet that is low in fat and cholesterol. If you need help, ask your doctor.  Exercise regularly. Ask your doctor for some good activities for you.  Take good care of your feet. ? Wear comfortable shoes that fit well. ? Check your feet often for any cuts or sores. Contact a doctor if:  You have cramps in your legs while walking.  You have leg pain when you are at rest.  You have coldness in a leg or foot.  Your skin changes.  You are unable to get or have an erection (erectile dysfunction).  You have cuts or sores on your feet that are not healing. Get help right away if:  Your arm or leg turns cold and blue.  Your arms or legs become red, warm, swollen, painful, or numb.  You have chest pain or trouble breathing.  You suddenly have weakness in your face, arm, or leg.  You become very confused or you cannot speak.  You suddenly have a very bad headache.  You suddenly cannot see. This information is not intended to replace advice given to you by your health care provider. Make sure you discuss any questions you have with your health care provider. Document Released: 04/08/2009 Document Revised: 06/20/2015 Document Reviewed: 06/22/2013 Elsevier Interactive Patient Education  2017 Elsevier Inc.  

## 2017-08-24 NOTE — Progress Notes (Signed)
VASCULAR [facility] VEIN SPECIALISTS OF [place]   CC: Follow up peripheral artery occlusive disease  History of Present Illness James Dougherty is a 67 y.o. male who is s/p right great toe ray amputation by Dr. Arbie CookeyEarly on 04/26/17.  Prior to amputation patient had right external iliac artery stenting as well as right SFA atherectomy and stenting by Dr. Darrick PennaFields in March 2019.  Following arteriogram with revascularization patient had in line flow to right foot via posterior tibial and peroneal artery.  Amputation site was left open and patient has home health RN changing wound VAC 3 times a week since discharge from hospital.  He denies rest pain or further tissue ischemia.  He is taking aspirin and Plavix daily.  He is also on a daily statin regimen.  Past medical history significant for insulin-dependent diabetes mellitus.      Dr. Arbie CookeyEarly last evaluated pt on 06-25-17. At that time there was a very small area of granulation tissue left at his open toe amputation which was clean.  No surrounding erythema.  He had an easily palpable popliteal pulse. Stable status post right iliac and SFA intervention with Dr. Darrick PennaFields and primary right great toe amputation by Dr. Arbie CookeyEarly.  That was completely healed at that visit.  He will Dougherty us again in July as scheduled for ABIs and right leg duplex and Dougherty James Dougherty.  He is also left superficial femoral and popliteal artery recanalization and subsequent stenting on 03/05/2014by Dr. Myra GianottiBrabham. This [facility] hisleft lateral lower legwhichhas healed, he was seen in the wound clinic. On 12/27/2012 he had angioplasty of in-stent stenosis.  The patient had a stroke in 1999,denies anysubsequentstroke or TIA activity. He had difficulties with aphasia and left-sided weakness which have resolved except slight expressive aphasia. He has had hearing loss since childhood.  He has generalized psoriasis lesions that wax and wane.   He is seeing a podiatrist to trim his  toenails.  Pt Diabetic: Yes, states his last A1C was6.9, improved  Pt smoker: former smoker, quit in 2012  Pt meds include: Statin :Yes ASA: Yes Other anticoagulants/antiplatelets: no   Past Medical History:  Diagnosis Date  . Arthritis    "hands" (12/27/2012)  . High cholesterol   . Hypertension   . Neuropathic pain   . PAD (peripheral artery disease) (HCC)   . Psoriasis   . Stroke [facility](HCC) 1999   "still have some speech problems at times; sometimes forget what I was going to say" (12/27/2012)  . Type II diabetes mellitus (HCC)   . Ulcer    Left ankle/leg    Social History Social History   Tobacco Use  . Smoking status: Former Smoker    Packs/day: 3.00    Years: 45.00    Pack years: 135.00    Types: Cigarettes    Last attempt to quit: 01/07/2011    Years since quitting: 6.6  . Smokeless tobacco: Never Used  Substance Use Topics  . Alcohol use: Not Currently    Comment: 12/27/2012 "couple times/yr I might have a drink"  . Drug use: No    Family History Family History  Problem Relation Age of Onset  . Diabetes Sister        Bilateral amputation of lower legs  . Diabetes Sister   . Heart disease Sister [id number]       Heart disease before age 67  . Hypertension Sister   . Heart attack Sister   . Hyperlipidemia Sister   . Hypertension  Father   . Hyperlipidemia Father   . Hyperlipidemia Mother   . Hypertension Mother   . Hypertension Daughter     Past Surgical History:  Procedure Laterality Date  . ABDOMINAL AORTAGRAM N/A 03/30/2012   Procedure: ABDOMINAL Ronny Flurry;  Surgeon: Nada Libman, MD;  Location: [facility] CATH LAB;  Service: Cardiovascular;  Laterality: N/A;  . ABDOMINAL AORTAGRAM N/A 12/27/2012   Procedure: ABDOMINAL Ronny Flurry;  Surgeon: Nada Libman, MD;  Location: [facility] CATH LAB;  Service: Cardiovascular;  Laterality: N/A;  . ABDOMINAL AORTOGRAM W/LOWER EXTREMITY N/A 04/05/2017   Procedure: ABDOMINAL AORTOGRAM W/LOWER EXTREMITY;  Surgeon: Sherren Kerns, MD;  Location: [facility] INVASIVE CV LAB;  Service: Cardiovascular;  Laterality: N/A;  . AMPUTATION Right 04/26/2017   Procedure: AMPUTATION TRANSMETATARSAL RIGHT GREAT TOE;  Surgeon: Larina Earthly, MD;  Location: [facility] OR;  Service: Vascular;  Laterality: Right;  . ANGIOPLASTY / STENTING FEMORAL Left 12/27/2012  . APPLICATION OF WOUND VAC Right 04/26/2017   Procedure: APPLICATION OF WOUND VAC;  Surgeon: Larina Earthly, MD;  Location: [facility] OR;  Service: Vascular;  Laterality: Right;  . FEMORAL ARTERY STENT  03/30/2012  . LOWER EXTREMITY ANGIOGRAM  12/27/2012   Procedure: LOWER EXTREMITY ANGIOGRAM;  Surgeon: Nada Libman, MD;  Location: [facility] CATH LAB;  Service: Cardiovascular;;  . PERIPHERAL VASCULAR ATHERECTOMY Right 04/05/2017   Procedure: PERIPHERAL VASCULAR ATHERECTOMY;  Surgeon: Sherren Kerns, MD;  Location: [facility] INVASIVE CV LAB;  Service: Cardiovascular;  Laterality: Right;  superficial femoral  . PERIPHERAL VASCULAR INTERVENTION Right 04/05/2017   Procedure: PERIPHERAL VASCULAR INTERVENTION;  Surgeon: Sherren Kerns, MD;  Location: [facility] INVASIVE CV LAB;  Service: Cardiovascular;  Laterality: Right;   Superficial femorl and external iliac  . POPLITEAL ARTERY STENT    . TONSILLECTOMY AND ADENOIDECTOMY      Allergies  Allergen Reactions  . Oxycodone Nausea And Vomiting  . Glipizide     Bad headache and blurred vision  . Bactrim [Sulfamethoxazole-Trimethoprim] Itching and Rash    Current Outpatient Medications  Medication Sig Dispense Refill  . amLODipine (NORVASC) 5 MG tablet Take 1 tablet (5 mg total) by mouth daily. 30 tablet 0  . aspirin EC 81 MG tablet Take 81 mg by mouth daily.    . clopidogrel (PLAVIX) 75 MG tablet Take 1 tablet (75 mg total) by mouth daily with breakfast. 30 tablet 0  . Dulaglutide (TRULICITY) 0.75 MG/0.5ML SOPN Inject 0.75 mg into the skin once a week. Wednesdays    . gabapentin (NEURONTIN) 600 MG tablet Take 600 mg by mouth every evening.     [name]  lisinopril-hydrochlorothiazide (PRINZIDE,ZESTORETIC) 20-25 MG tablet Take 1 tablet by mouth daily.    [name] NOVOLIN 70/30 RELION (70-30) 100 UNIT/ML injection Inject 10 Units into the skin 2 (two) times daily with a meal.   12  . Podiatric Products (GOLD BOND FOOT) CREA Apply 1 application topically 2 (two) times daily as needed (DRY SKIN). DIABETIC CREAM    . simvastatin (ZOCOR) 20 MG tablet Take 20 mg by mouth at bedtime.      No current facility-administered medications for this visit.     ROS: Dougherty HPI for pertinent positives and negatives.   Physical Examination  Vitals:   08/24/17 1213 08/24/17 1214  BP: 130/70 130/62  Pulse: 63   Resp: 20   Temp: (!) 97 F (36.1 C)   TempSrc: Oral   SpO2: 99%   Weight: 225 lb (102.1 kg)   Height: 5\' 8"  (1.727 m)  Body mass index is 34.21 kg/m.  General: A&O x 3, WDWN, obese male. Gait: normal Eyes: PERRLA. Pulmonary:Respirations are non labored,CTAB, without wheezes, rales,or rhonchi. Cardiac: regularrhythm, no detected murmur, + left carotid bruit.     Abdominal aortic pulseis not palpable. Radial pulses: are 1+ palpable and =         Right foot             VASCULAR EXAM: Extremities:  Right great toe is surgically absent, well healed right great toe amputation site.       LE Pulses LEFT RIGHT   FEMORAL 2+ palpable Faintly palpable     POPLITEAL not palpable  2+ palpable   POSTERIOR TIBIAL not palpable  not palpable    DORSALIS PEDIS  ANTERIOR TIBIAL not palpable  not palpable    Abdomen: soft, NT, no palptated masses. Skin: no rashes, no ulcers. Dougherty extremities. Musculoskeletal:no muscle wasting or atrophy, Dougherty Extremities. Neurologic: A&O X 3;appropriateaffect, MOTOR FUNCTION: moving  all extremities equally, motor strength 5/5 throughout. Speech is fluent/normal. CN 2-12 intact except for hard of hearing Psychiatric: Thought content is normal, mood appropriate for clinical situation    ASSESSMENT: James Dougherty is a 67 y.o. male who is s/p right great toe ray amputation by Dr. Arbie Cookey on 04/26/17.  He is also s/p atherectomy right superficial femoral artery, drug-coated balloon angioplasty right superficial femoral artery, stent right superficial femoral artery (6 x 150 self-expanding), stent right external iliac artery (7 x 40) self-expanding on 04-05-17 by Dr. Darrick Penna for nonhealing wound right foot.  He is also s/p left superficial femoral and popliteal artery recanalization and subsequent stenting on 03/05/2014by Dr. Myra Gianotti. This [facility] hisleft lateral lower legwhichhas healed, he was seen in the wound clinic. On 12/27/2012 he had angioplasty of in-stent stenosis.  The patient had a stroke in 1999,denies anysubsequentstroke or TIA activity. He had difficulties with aphasia and left-sided weakness which have resolved except slight expressive aphasia.   Right great toe amputation site is well healed.  Pt is walking more, feels well.      DATA  Carotid Duplex (08-24-17); Right ICA: 60-79% stenosis Left ICA: 40-59% stenosis Right vertebral artery flow is antegrade, left vertebral artery not visualized.  Bilateral subclavian artery waveforms are normal.  No change compared to the exam on 12-09-16.   Bilateral LE Arterial Duplex (08-24-17): Right: highest velocity is 233 cm/s PSV at the proximal SFA, which is at the proximal stent. All monophasic waveforms. Left: Highest velocity is 227 cm/s PSV at the proximal SFA which is at the proximal stent. All monophasic waveforms.     ABI (Date: 08/24/2017):  R:   ABI: 0.81 (was 0.45 on 12-09-16),   PT: bi  DP: mono  TBI:  S/p great toe amputation (was 0.11)  L:   ABI: 0.92 (was 0.70),    PT: mono  DP: mono  TBI: 0.64, toe pressure 102 (was 0.14)  Significant improvement in bilateral ABI and left TBI. Right great toe is surgically absent.     PLAN:  Based on the patient's vascular studies and examination, pt will return to clinic in 6 months with carotid duplex, ABI's, and bilateral LE arterial duplex. I advised pt to notify us if he develops concerns re the circulation in his feet or legs.   Continue walking at least 30 minutes daily in a safe environment.   I discussed in depth with the patient the nature of atherosclerosis, and emphasized the importance of  maximal medical management including strict control of blood pressure, blood glucose, and lipid levels, obtaining regular exercise, and continued cessation of smoking.  The patient is aware that without maximal medical management the underlying atherosclerotic disease process will progress, limiting the benefit of any interventions.  The patient was given information about PAD including signs, symptoms, treatment, what symptoms should prompt the patient to seek immediate medical care, and risk reduction measures to take.  Charisse March, RN, MSN, FNP-C [facility] [place] Phone: [phone]  Clinic MD: Early  08/24/17 1:15 PM

## 2017-08-26 ENCOUNTER — Other Ambulatory Visit: Payer: Self-pay | Admitting: Pharmacy Technician

## 2017-08-26 NOTE — Patient Outreach (Signed)
[facility]) Care Management  08/26/2017  Sonia SideJames F Pink 07-Mar-1950 [id number]   Follow up call to [name] to check status of patient application for Trulicity. Adair LaundryLaTonya states that application is still under review, she sent a request that follow up call be made at the beginning of next week to check status.  Will route note to [facility] Duanne MoronElisabeth Dhalla to inform.  Suzan SlickAshley N. Ernesta Ambleoleman, CPhT [facility] Care Management [phone]

## 2017-09-02 ENCOUNTER — Other Ambulatory Visit: Payer: Self-pay | Admitting: Pharmacist

## 2017-09-02 NOTE — Patient Outreach (Signed)
[facility]) Care Management  09/02/2017  Sonia SideJames F Matura [birth year] [id number]   Follow up call to [name] to check status of patient application for Trulicity. [employer] representative Noor states that application status is still listed as pending, but reviews application now and states that it is approved from today through 01/25/18 and that the provider's office should receive the 4 month supply of the medication for the patient within 5-7 business days.  PLAN  1) Will call to follow up with patient's PCP office to let them know to expect the Trulicity for the patient in 5-7 business days and to request that they notify patient when received for pick up.  2) Will call Mr. Malinak to let him know that he has been approved, advise that it will be sent to his PCP office and to review with him the process for ordering a refill.  3) Will route this note to [facility] Pharmacy Technician Lilla ShookAshley Coleman to let her know of the application approval and to request that she follow up with Mr. Royals in 2 weeks to confirm that he received the Trulicity.  Duanne MoronElisabeth Bowman Higbie, PharmD, [facility] DelhiBCACP Clinical Pharmacist [facility] Care Management [phone]

## 2017-09-02 NOTE — Patient Outreach (Signed)
[facility]) Care Management  09/02/2017  Sonia SideJames F Hampshire 1951/01/24 [id number]   Call and speak with Vanice SarahKaley in patient's PCP office to let the office know to expect the Trulicity for the patient in 5-7 business days and to request that they notify patient when received for pick up. Vanice SarahKaley confirms that they will call to notify the patient when his Trulicity is received.  Duanne MoronElisabeth Lindsey Demonte, PharmD, [facility] HospitalBCACP Clinical Pharmacist [facility] Care Management [phone]

## 2017-09-02 NOTE — Patient Outreach (Signed)
[facility]) Care Management  09/02/2017  Sonia SideJames F Eskew 19-Aug-1950 [id number]   Call Mr. Commins to let him know that he has been approved for patient assistance for Trulicity, advise that it will be sent to his PCP office and to review with him the process for ordering a refill.. Left a HIPAA compliant message on the patient's voicemail. If have not heard from patient by next week, will give him another call at that time.  Duanne MoronElisabeth Nyles Mitton, PharmD, [facility] CenterBCACP Clinical Pharmacist [facility] Care Management [phone]

## 2017-09-08 ENCOUNTER — Other Ambulatory Visit: Payer: Self-pay | Admitting: Pharmacist

## 2017-09-08 NOTE — Patient Outreach (Signed)
[facility]) Care Management  09/08/2017  Sonia SideJames F Fulgham 12/22/1950 [id number]   Receive a call back from Sonia SideJames F Stimmel. HIPAA identifiers verified and verbal consent received.   Let patient know that he has been approved for patient assistance for Trulicity, advise that it will be sent to his PCP office and review with him the process for ordering a refill. [name] reports that he has not yet heard from his office about receiving the Trulicity from [facility], but reports that he did recently receive several sample pens of Trulicity from the office, so that he currently has plenty of medication on hand.  [name] denies any further medication questions/cocnerns at this time.   Let Mr. Youngren know that [facility] Pharmacy Technician Lilla Shookshley Coleman will follow up with him next to confirm that he has received the medication.   Duanne MoronElisabeth Anetha Slagel, PharmD, [facility] LLCBCACP Clinical Pharmacist [facility] Care Management [phone]

## 2017-09-08 NOTE — Patient Outreach (Signed)
[facility]) Care Management  09/08/2017  Sonia SideJames F Bronk 05-Oct-1950 [id number]   Receive a voicemail from Sonia SideJames F Gambone returning my last call. Call again to Mr. Bolinger to let him know that he has been approved for patient assistance for Trulicity, advise that it will be sent to his PCP office and to review with him the process for ordering a refill. Left a HIPAA compliant message on the patient's voicemail. If have not heard from patient by 09/10/17, will give him another call at that time.  Duanne MoronElisabeth Shateka Petrea, PharmD, [facility] LLCBCACP Clinical Pharmacist [facility] Care Management [phone]

## 2017-09-13 ENCOUNTER — Other Ambulatory Visit: Payer: Self-pay

## 2017-09-13 DIAGNOSIS — I70213 Atherosclerosis of native arteries of extremities with intermittent claudication, bilateral legs: Secondary | ICD-10-CM

## 2017-09-13 DIAGNOSIS — I6523 Occlusion and stenosis of bilateral carotid arteries: Secondary | ICD-10-CM

## 2017-09-13 DIAGNOSIS — I739 Peripheral vascular disease, unspecified: Secondary | ICD-10-CM

## 2017-09-23 ENCOUNTER — Other Ambulatory Visit: Payer: Self-pay | Admitting: Pharmacy Technician

## 2017-09-23 NOTE — Patient Outreach (Signed)
[facility]) Care Management  09/23/2017  Sonia SideJames F Dougherty [birth year] [id number]   Successful outreach call to [name], HIPAA identifiers verified. [name] confirms that he received his Trulicity from [employer] and that he understands how to obtain refills if he needs them.  Will route note [facility] RPh Duanne MoronElisabeth Dhalla for case closure.  Suzan SlickAshley N. Ernesta Ambleoleman, CPhT [facility] Care Management [phone]

## 2017-10-01 ENCOUNTER — Other Ambulatory Visit: Payer: Self-pay | Admitting: Pharmacist

## 2017-10-01 NOTE — Patient Outreach (Signed)
[facility]) Care Management  10/01/2017  TASHAN CRUZHERNANDEZ Aug 15, 1950 [id number]   Receive InBasket message from [facility] Pharmacy Technician Lilla Shook that [name] confirmed that he received his Trulicity from [employer] and that he understands how to obtain refills if he needs them.  Will close pharmacy episode at this time.  [name], PharmD, [facility] Clinical Pharmacist [facility] Care Management [phone]

## 2017-10-27 DIAGNOSIS — B351 Tinea unguium: Secondary | ICD-10-CM | POA: Diagnosis not present

## 2017-10-27 DIAGNOSIS — M79672 Pain in left foot: Secondary | ICD-10-CM | POA: Diagnosis not present

## 2017-10-27 DIAGNOSIS — E1151 Type 2 diabetes mellitus with diabetic peripheral angiopathy without gangrene: Secondary | ICD-10-CM | POA: Diagnosis not present

## 2017-10-27 DIAGNOSIS — M79671 Pain in right foot: Secondary | ICD-10-CM | POA: Diagnosis not present

## 2017-11-13 DIAGNOSIS — Z23 Encounter for immunization: Secondary | ICD-10-CM | POA: Diagnosis not present

## 2017-12-09 DIAGNOSIS — I10 Essential (primary) hypertension: Secondary | ICD-10-CM | POA: Diagnosis not present

## 2017-12-09 DIAGNOSIS — E7800 Pure hypercholesterolemia, unspecified: Secondary | ICD-10-CM | POA: Diagnosis not present

## 2017-12-09 DIAGNOSIS — E1165 Type 2 diabetes mellitus with hyperglycemia: Secondary | ICD-10-CM | POA: Diagnosis not present

## 2017-12-13 DIAGNOSIS — E1140 Type 2 diabetes mellitus with diabetic neuropathy, unspecified: Secondary | ICD-10-CM | POA: Diagnosis not present

## 2017-12-13 DIAGNOSIS — E782 Mixed hyperlipidemia: Secondary | ICD-10-CM | POA: Diagnosis not present

## 2017-12-13 DIAGNOSIS — Z1389 Encounter for screening for other disorder: Secondary | ICD-10-CM | POA: Diagnosis not present

## 2017-12-13 DIAGNOSIS — Z23 Encounter for immunization: Secondary | ICD-10-CM | POA: Diagnosis not present

## 2017-12-13 DIAGNOSIS — I10 Essential (primary) hypertension: Secondary | ICD-10-CM | POA: Diagnosis not present

## 2017-12-13 DIAGNOSIS — I739 Peripheral vascular disease, unspecified: Secondary | ICD-10-CM | POA: Diagnosis not present

## 2018-01-05 ENCOUNTER — Other Ambulatory Visit: Payer: Self-pay | Admitting: Pharmacist

## 2018-01-05 NOTE — Patient Outreach (Signed)
[facility]) Care Management  01/05/2018  James SideJames F Dougherty 08/30/1950 [id number]   Call to follow up with [facility] to confirm that fax of new refill request form was received from [facility] and to ask that this be completed and sent back to [facility]. Speak with James FailAnita in the office who confirms that the fax was received and that it will be given to Dr. Dimas AguasHoward to be completed and faxed back to [facility].  James MoronElisabeth Andrian Dougherty, PharmD, [facility]ucahBCACP Clinical Pharmacist [facility] Care Management [phone]

## 2018-01-05 NOTE — Patient Outreach (Signed)
[facility]) Care Management  01/05/2018  James Dougherty 1950/05/26 [id number]   Call to follow up with James Dougherty regarding medication assistance for Trulicity. HIPAA identifiers verified and verbal consent received.  [facility] assisted patient with application for Trulicity from [employer] patient assistance program for the 2019 calendar year. Call to verify whether patient is still taking this medication and, if so, to assist with application for 2020 calendar year.  [name] reports that he has not yet contacted [name]/his provider regarding getting a refill of the Trulicity, but that he currently has enough to last him until 01/19/18. Confirms that he is still using the Trulicity 0.75 mg once weekly as directed.  Place a 3-way call with patient on the line to [employer] Patient Assistance program and speak with representative [name]. [name] states that the refill must be initiated by the provider faxing back a new refill request form to [employer]. Request that this new form be sent over to James Dougherty's office now and provide fax number. [name] provides patient's ID number, [id number], and return fax number, [phone]. States that as of 2020, James Dougherty will ship medication either to the patient directly or to the patient's home, whichever is selected on the application. Patient states that he would prefer to continue to have the medication shipped to the provider office due to concern that he might not be at home when the medication is delivered.  Patient states that his new PCP for 2020 will be Dr[facility] [name]. Patient chart updated accordingly.   PLAN  1) Will call to follow up with [facility] to confirm that fax of new refill request form was received from [facility] and to ask that this be completed and sent back to [employer].  2) Will send an InBasket message to [facility] Pharmacy Technician James LarssonJill  Dougherty asking her to assist the patient and his PCP with completing the Patient Assistance Application for Trulicity through [employer] for 2020.  3) Will call to follow up with patient in 2 weeks to confirm that he received a refill of the Trulicity.  James Dougherty, PharmD, [facility] HospitalBCACP Clinical Pharmacist [facility] Care Management [phone]

## 2018-01-12 ENCOUNTER — Other Ambulatory Visit: Payer: Self-pay | Admitting: Pharmacist

## 2018-01-12 NOTE — Patient Outreach (Signed)
[facility]) Care Management  01/12/2018  James Dougherty 08-Jun-1950 [id number]   Incoming call from James Dougherty regarding medication assistance. HIPAA identifiers verified.  James Dougherty reports that he received the [name] patient assistance application in the mail and has a few questions. Address patient's questions.  Patient calls PCP office and confirms that Trulicity refill request was faxed back to [employer] by the provider on 01/07/18. Patient confirms that he has one more pen left, for 01/19/18.  PLAN  Will call to follow up again with patient next week, as planned, to confirm that he received the Trulicity refill.  James Dougherty, PharmD, [facility] HospitalBCACP Clinical Pharmacist [facility] Care Management [phone]

## 2018-01-17 ENCOUNTER — Other Ambulatory Visit: Payer: Self-pay | Admitting: Pharmacy Technician

## 2018-01-17 ENCOUNTER — Other Ambulatory Visit: Payer: Self-pay | Admitting: Pharmacist

## 2018-01-17 NOTE — Patient Outreach (Signed)
[facility]) Care Management  01/17/2018  James SideJames F Dougherty [birth year] [id number]   Call to follow up with James Dougherty regarding medication assistance. HIPAA identifiers verified.  James Dougherty reports that he mailed the 2020 [employer] patient assistance application back to us last Thursday.  Reports that he has not yet heard back from [facility] or his PCP office regarding his Trulicity refill. Call [employer] with patient on the phone now. Speak with [employer] representative Byrd HesselbachMaria, who transfers us to [facility] representative Shanda BumpsJessica. Shanda BumpsJessica states that the pharmacy is behind on paperwork and is unable to confirm whether the refill request was received from 01/07/18. Shanda BumpsJessica asks that the patient call back to their direct line [phone]) again on Thursday and, if the refill has not been processed by that time, the pharmacy will accept a verbal request from him and overnight the medication to the patient.  PLAN  1) James Dougherty to call to follow up with [facility] on 01/20/18 regarding his Trulicity refill.  2) Will call to follow up with Mr. Heyer on 01/21/18.  Duanne MoronElisabeth Rishaan Gunner, PharmD, [facility] BradfordBCACP Clinical Pharmacist [facility] Care Management [phone]

## 2018-01-17 NOTE — Patient Outreach (Signed)
[facility]) Care Management  01/17/2018  Sonia SideJames F Bushway 1950/08/06 [id number]  Received all necessary signatures and documents from both patient and provider for [employer] patient assistance for Trulicity.  Submitted completed application to [employer] via fax.  Will followup in 10-14 business days to inquire on status of application.  Sy Saintjean P. Dalyce Renne, CPhT [facility] Care Management [phone]

## 2018-01-21 ENCOUNTER — Other Ambulatory Visit: Payer: Self-pay | Admitting: Pharmacist

## 2018-01-21 NOTE — Patient Outreach (Signed)
[facility]) Care Management  01/21/2018  Sonia SideJames F Lacasse 05/13/1950 [id number]   Call patient's PCP office and request that the provider/staff call with a verbal order for the patient's prescription to [facility]. Also request that the patient receive samples of Trulicity if any are available in the office. Leave message with [name] in the office.  Duanne MoronElisabeth Elissia Spiewak, PharmD, [facility] Center[facility] Clinical Pharmacist [facility] Care Management [phone]

## 2018-01-21 NOTE — Patient Outreach (Signed)
[facility]N) Care Management  01/21/2018  Sonia SideJames F Sherburne 04/07/1950 [id number]  Receive a [facility] from [facility] in Dr. Dian SituSasser's office. Carollee HerterShannon states that she will call the verbal order into [facility]. Confirm phone number for Carollee HerterShannon, [phone], and ask her to insist on speaking directly to the pharmacist and to remind [facility] that the patient needs the medication by this coming Wednesday.   Carollee HerterShannon states that she will also check to see if her office has any Trulicity samples to give the patient.  Duanne MoronElisabeth Tyia Binford, PharmD, [facility]ACP Clinical Pharmacist  [facility] Care Management [phone]

## 2018-01-21 NOTE — Patient Outreach (Signed)
[facility]) Care Management  01/21/2018  James Dougherty December 19, 1950 [id number]   Call to follow up with [name] regarding medication assistance. HIPAA identifiers verified.  [name] reports that he called [facility] again yesterday, as we planned from Monday, but that the representative told him again that they had not received the refill request from 01/07/18 and asked that the office fax it again. [name] reports that he called his PCP office again to ask that it be resent.  Call [facility] with patient on the phone now. Speak with [facility] representative Maurice MarchLane, who transfers us to Pharmacist Buckey. Buckey states that the pharmacy is behind on paperwork and is still unable to confirm whether the refill request was received from 01/07/18, but states that he will take a verbal refill request from the patient's PCP office. Let Buckey know that the patient is now out of the medication and needing to administer it again on 01/26/2018. Remind the pharmacist that we have been sending this refill request for two weeks now.   PLAN  Will call patient's PCP office and request that the provider/staff call with a verbal order for the patient's prescription to [facility]. Will also request that the patient receive samples of Trulicity if any are available in the office.  [name], PharmD, [facility] IncBCACP Clinical Pharmacist [facility] Care Management [phone]

## 2018-01-28 ENCOUNTER — Other Ambulatory Visit: Payer: Self-pay | Admitting: Pharmacist

## 2018-01-28 NOTE — Patient Outreach (Signed)
[facility]) Care Management  01/28/2018  James Dougherty 04/05/1950 [id number]   Call to follow up with [name] regarding his Trulicity. HIPAA identifiers verified.  Mr Kari reports that he has not heard back from [facility] regarding his refill, but that he picked up a month of Trulicity samples from his PCP office.   Patient states that he has been sick with a cold since Monday, but that he is staying hydrated and checking his blood sugar regularly. Encourage patient to continue both, to also rest and encourage him to contact his doctor if needed if he does not feel better next week. Also provide patient with the 24 nurse advice line number. Patient confirms that he has my phone number.  PLAN  1) Patient to continue to follow with [facility] Pharmacy Technician Pattricia Boss for 2020 [facility] Patient Assistance Program process.  [name], PharmD, [facility] Clinical Pharmacist [facility] Care Management [phone]

## 2018-01-31 ENCOUNTER — Other Ambulatory Visit: Payer: Self-pay | Admitting: Pharmacy Technician

## 2018-01-31 NOTE — Patient Outreach (Signed)
[facility]) Care Management  01/31/2018  James Dougherty 16-Nov-1950 [id number]   Care coordination call placed to [facility] in regards to patient's medication assistance application forTrulicity.  Spoke to [place] who said patient was APPROVED from 01/26/2018-01/26/2019. Patient will be receiving 20 pens (5boxes with 4 pens each) and that it would be shipped out this week.  Will followup with patient in 7-10 business days to confirm receipt of medication.  Bryon Parker P. Marrissa Dai, CPhT Musician Care Management [phone]

## 2018-02-02 ENCOUNTER — Other Ambulatory Visit: Payer: Self-pay | Admitting: Pharmacist

## 2018-02-02 NOTE — Patient Outreach (Signed)
[facility]) Care Management  02/02/2018  James Dougherty 1950/11/11 [id number]   Incoming call from James Dougherty regarding medication assistance. HIPAA identifiers verified.  James Dougherty calls to let me know that he just received the 5 boxes of Trulicity from [employer] Patient Assistance Program. Let patient know that per note in chart from [facility] CPhT Pattricia Boss, patient has been approved by the program for the 2020 calendar year. Patient expresses appreciation for our help. Counsel patient on the importance of calling about a refill prior to running out of medication.  Patient denies any further medication questions/conncerns at this time. Confirms that he has my number, Jill's number and the [facility] phone number saved.  PLAN  1) Will send a message to [facility] Pharmacy Technician to let her know that patient received his medication.  2) Will close pharmacy episode at this time and send patient's PCP a closure letter.  [name], PharmD, [facility] Clinical Pharmacist [facility] Care Management [phone]

## 2018-03-02 ENCOUNTER — Other Ambulatory Visit: Payer: Self-pay | Admitting: Pharmacy Technician

## 2018-03-02 DIAGNOSIS — B351 Tinea unguium: Secondary | ICD-10-CM | POA: Diagnosis not present

## 2018-03-02 DIAGNOSIS — M79671 Pain in right foot: Secondary | ICD-10-CM | POA: Diagnosis not present

## 2018-03-02 DIAGNOSIS — M79672 Pain in left foot: Secondary | ICD-10-CM | POA: Diagnosis not present

## 2018-03-02 DIAGNOSIS — E1151 Type 2 diabetes mellitus with diabetic peripheral angiopathy without gangrene: Secondary | ICD-10-CM | POA: Diagnosis not present

## 2018-03-02 NOTE — Patient Outreach (Signed)
[facility]) Care Management  03/02/2018  James Dougherty 1950/07/01 [id number]  Successful outgoing call placed to patient in regards to his patient assistance application with [employer] for Trulicity.  Was returning patient's call, patient answered phone, HIPAA identifiers verified.  Patient was calling to inform me that he had received a call from Dr Dian Situ office stating that he had 5 boxes of Trulicity to be picked up. Patient told the office that he did not need it at this time as he had enough medication until June 10 and was wondering why [employer] had sent him more. Explained to patient that since he had to reapply for 2020 with [employer] they automatically sent it to him. Informed patient I would followup with his provider to see what they were going to do with the Trulicity (ie store it for him, send it back etc).  Plan to followup with Dr Dian Situ office and then return call back to patient.  Madylyn Insco P. Muskan Bolla, CPhT Musician Care Management [phone]

## 2018-03-02 NOTE — Patient Outreach (Signed)
[facility]) Care Management  03/02/2018  James Dougherty [birth year] [id number]   Care coordination call placed to Dr Dian Situ office in regards to [employer] application for [employer].  Spoke to [employer]. Inquired if the provider's office had storage capabilities for patient's Trulicity since he would not need in until June. Carollee Herter said it would be best for patient to come pick the medication up. Informed Carollee Herter that I would request the patient speak to her when he picks up the medication so he can tell her how much he has left as to avoid an overstock of the medication. Carollee Herter said that would be fine.  Will reach out to patient to provide him with this updated information.  Teghan Philbin P. Violet Cart, CPhT Musician Care Management [phone]

## 2018-03-02 NOTE — Patient Outreach (Signed)
[facility]) Care Management  03/02/2018  James Dougherty 28-Oct-1950 [id number]    ADDENDUM  Successful outgoing call placed to patient, HIPAA identifiers verified.   Informed patient to go pick up his Trulicity from the provider's office and to speak to Carollee Herter so he could tell her how much of the medication he had left so the provider would know when to order the refills. Patient verbalized understanding.  Patient called me back to tell me they only had 1 extra box of Trulicity at the office waiting on him which he picked up. He said that would make him have enough medication until around July 8. Informed patient to call his provider's office when he opens that last box of medication he has so the provider can start the refill process. Patient verbalized understanding. Also confirmed with patient that he had our name and number if problems, questions or concerns were to arise.  Dell Briner P. Leean Amezcua, CPhT Musician Care Management [phone]

## 2018-04-11 ENCOUNTER — Ambulatory Visit (HOSPITAL_COMMUNITY)
Admission: RE | Admit: 2018-04-11 | Discharge: 2018-04-11 | Disposition: A | Discharge status: Home / Self Care | Payer: Medicare Other | Source: Ambulatory Visit | Attending: Surgery | Admitting: Surgery

## 2018-04-11 ENCOUNTER — Other Ambulatory Visit: Payer: Self-pay

## 2018-04-11 ENCOUNTER — Ambulatory Visit: Payer: Medicare Other | Admitting: Family

## 2018-04-11 ENCOUNTER — Other Ambulatory Visit: Payer: Self-pay | Admitting: *Deleted

## 2018-04-11 ENCOUNTER — Ambulatory Visit (INDEPENDENT_AMBULATORY_CARE_PROVIDER_SITE_OTHER)
Admission: RE | Admit: 2018-04-11 | Discharge: 2018-04-11 | Disposition: A | Discharge status: Home / Self Care | Payer: Medicare Other | Source: Ambulatory Visit | Attending: Surgery | Admitting: Surgery

## 2018-04-11 ENCOUNTER — Encounter: Payer: Self-pay | Admitting: Family

## 2018-04-11 ENCOUNTER — Encounter: Payer: Self-pay | Admitting: *Deleted

## 2018-04-11 VITALS — BP 163/65 | HR 75 | Temp 97.1°F | Resp 16 | Ht 68.0 in | Wt 251.5 lb

## 2018-04-11 DIAGNOSIS — I70213 Atherosclerosis of native arteries of extremities with intermittent claudication, bilateral legs: Secondary | ICD-10-CM

## 2018-04-11 DIAGNOSIS — E1151 Type 2 diabetes mellitus with diabetic peripheral angiopathy without gangrene: Secondary | ICD-10-CM

## 2018-04-11 DIAGNOSIS — Z959 Presence of cardiac and vascular implant and graft, unspecified: Secondary | ICD-10-CM | POA: Diagnosis not present

## 2018-04-11 DIAGNOSIS — I6523 Occlusion and stenosis of bilateral carotid arteries: Secondary | ICD-10-CM | POA: Diagnosis not present

## 2018-04-11 DIAGNOSIS — I739 Peripheral vascular disease, unspecified: Secondary | ICD-10-CM

## 2018-04-11 NOTE — Progress Notes (Signed)
[facility] [place]   CC: Follow up peripheral artery occlusive disease  History of Present Illness James Dougherty is a 68 y.o. male who is s/p rightgreat toe ray amputationby Dr. Arbie Cookey on 04/26/17.Prior to amputation patient had right external iliac artery stenting as well as right SFA atherectomy and stenting by Dr. Darrick Penna in March 2019.Following arteriogram with revascularization patient had in line flow to right foot via posterior tibial and peroneal artery.Amputation site was left open and patient has home health RN changing wound VAC 3 times a week since discharge from hospital.He denies rest pain or further tissue ischemia.Past medical history significant for insulin-dependent diabetes mellitus.    Dr. Arbie Cookey last evaluated pt on 06-25-17. At that time there was avery small area of granulation tissue left at his open toe amputation which was clean. No surrounding erythema. He had an easily palpable popliteal pulse. Stable status post right iliac and SFA intervention with Dr. Darrick Penna and primary right great toe amputation by Dr. Arbie Cookey. That was completely healed at that visit. He will see [place] again in July as scheduled for ABIs and right leg duplex and see Rosalita Chessman.  He is alsoleft superficial femoral and popliteal artery recanalization and subsequent stenting on 03/05/2014by Dr. Myra Gianotti. [facility] hisleft lateral lower legwhichhas healed, he was seen in the wound clinic. On 12/27/2012 he had angioplasty of in-stent stenosis.  After walking 50-60 yards, both calves and thighs hurt, relieved with 30 seconds rest, this feels stable per pt.   He walks 50 yards daily.  The patient had a stroke in 1999,denies anysubsequentstroke or TIA activity. He had difficulties with aphasia and left-sided weakness which have resolved except slight expressive aphasia. He has had hearing loss since childhood.  He has generalized psoriasis  lesions that wax and wane.   He is seeing a podiatrist to trim his toenails.  Diabetic: Yes, states his last A1C was6.9 Tobacco use: former smoker, quit in 2012  Pt meds include: Statin :Yes ASA: Yes Other anticoagulants/antiplatelets: no    Past Medical History:  Diagnosis Date  . Arthritis    "hands" (12/27/2012)  . High cholesterol   . Hypertension   . Neuropathic pain   . PAD (peripheral artery disease) (HCC)   . Psoriasis   . Stroke [facility]) 1999   "still have some speech problems at times; sometimes forget what I was going to say" (12/27/2012)  . Type II diabetes mellitus (HCC)   . Ulcer    Left ankle/leg    Social History Social History   Tobacco Use  . Smoking status: Former Smoker    Packs/day: 3.00    Years: 45.00    Pack years: 135.00    Types: Cigarettes    Last attempt to quit: 01/07/2011    Years since quitting: 7.2  . Smokeless tobacco: Never Used  Substance Use Topics  . Alcohol use: Not Currently    Comment: 12/27/2012 "couple times/yr I might have a drink"  . Drug use: No    Family History Family History  Problem Relation Age of Onset  . Diabetes Sister        Bilateral amputation of lower legs  . Diabetes Sister   . Heart disease Sister 97       Heart disease before age 61  . Hypertension Sister   . Heart attack Sister   . Hyperlipidemia Sister   . Hypertension Father   . Hyperlipidemia Father   . Hyperlipidemia Mother   .  Hypertension Mother   . Hypertension Daughter     Past Surgical History:  Procedure Laterality Date  . ABDOMINAL AORTAGRAM N/A 03/30/2012   Procedure: ABDOMINAL Ronny Flurry;  Surgeon: Nada Libman, MD;  Location: [facility] CATH LAB;  Service: Cardiovascular;  Laterality: N/A;  . ABDOMINAL AORTAGRAM N/A 12/27/2012   Procedure: ABDOMINAL Ronny Flurry;  Surgeon: Nada Libman, MD;  Location: [facility] CATH LAB;  Service: Cardiovascular;  Laterality: N/A;  . ABDOMINAL AORTOGRAM W/LOWER EXTREMITY N/A 04/05/2017   Procedure:  ABDOMINAL AORTOGRAM W/LOWER EXTREMITY;  Surgeon: Sherren Kerns, MD;  Location: [facility] INVASIVE CV LAB;  Service: Cardiovascular;  Laterality: N/A;  . AMPUTATION Right 04/26/2017   Procedure: AMPUTATION TRANSMETATARSAL RIGHT GREAT TOE;  Surgeon: Larina Earthly, MD;  Location: [facility] OR;  Service: Vascular;  Laterality: Right;  . ANGIOPLASTY / STENTING FEMORAL Left 12/27/2012  . APPLICATION OF WOUND VAC Right 04/26/2017   Procedure: APPLICATION OF WOUND VAC;  Surgeon: Larina Earthly, MD;  Location: [facility] OR;  Service: Vascular;  Laterality: Right;  . FEMORAL ARTERY STENT  03/30/2012  . LOWER EXTREMITY ANGIOGRAM  12/27/2012   Procedure: LOWER EXTREMITY ANGIOGRAM;  Surgeon: Nada Libman, MD;  Location: [facility] CATH LAB;  Service: Cardiovascular;;  . PERIPHERAL VASCULAR ATHERECTOMY Right 04/05/2017   Procedure: PERIPHERAL VASCULAR ATHERECTOMY;  Surgeon: Sherren Kerns, MD;  Location: [facility] INVASIVE CV LAB;  Service: Cardiovascular;  Laterality: Right;  superficial femoral  . PERIPHERAL VASCULAR INTERVENTION Right 04/05/2017   Procedure: PERIPHERAL VASCULAR INTERVENTION;  Surgeon: Sherren Kerns, MD;  Location: [facility] INVASIVE CV LAB;  Service: Cardiovascular;  Laterality: Right;   Superficial femorl and external iliac  . POPLITEAL ARTERY STENT    . TONSILLECTOMY AND ADENOIDECTOMY      Allergies  Allergen Reactions  . Oxycodone Nausea And Vomiting  . Glipizide     Bad headache and blurred vision  . Bactrim [Sulfamethoxazole-Trimethoprim] Itching and Rash    Current Outpatient Medications  Medication Sig Dispense Refill  . amLODipine (NORVASC) 5 MG tablet Take 1 tablet (5 mg total) by mouth daily. 30 tablet 0  . aspirin EC 81 MG tablet Take 81 mg by mouth daily.    . clopidogrel (PLAVIX) 75 MG tablet Take 1 tablet (75 mg total) by mouth daily with breakfast. 30 tablet 0  . Dulaglutide (TRULICITY) 0.75 MG/0.5ML SOPN Inject 0.75 mg into the skin once a week. Wednesdays    . gabapentin (NEURONTIN) 600 MG tablet  Take 600 mg by mouth every evening.     [name] lisinopril-hydrochlorothiazide (PRINZIDE,ZESTORETIC) 20-25 MG tablet Take 1 tablet by mouth daily.    [name] NOVOLIN 70/30 RELION (70-30) 100 UNIT/ML injection Inject 14 Units into the skin 2 (two) times daily with a meal.   12  . Podiatric Products (GOLD BOND FOOT) CREA Apply 1 application topically 2 (two) times daily as needed (DRY SKIN). DIABETIC CREAM    . simvastatin (ZOCOR) 20 MG tablet Take 20 mg by mouth at bedtime.      No current facility-administered medications for this visit.     ROS: See HPI for pertinent positives and negatives.   Physical Examination  Vitals:   04/11/18 1227  BP: (!) 163/65  Pulse: 75  Resp: 16  Temp: (!) 97.1 F (36.2 C)  TempSrc: Oral  SpO2: 97%  Weight: 251 lb 8 oz (114.1 kg)  Height:  (1.727 m)   Body mass index is 38.24 kg/m.  General: A&O x 3, WDWN, obese male. Gait: normal HENT: No  gross abnormalities.  Eyes: PERRLA. Pulmonary: Respirations are non labored, CTAB, good air movement in all fields Cardiac: regular rhythm, no detected murmur.         Carotid Bruits Right Left   Positive Positive   Radial pulses are 2+ palpable bilaterally   Adominal aortic pulse is not palpable                         VASCULAR EXAM: Extremities without ischemic changes, without Gangrene; without open wounds. Right great toe is surgically absent, well healed right great toe amputation site.                                                                                                           LE Pulses Right Left       FEMORAL  not palpable seated, unable to get on exam table  not palpable        POPLITEAL  not palpable   not palpable       POSTERIOR TIBIAL  not palpable   not palpable        DORSALIS PEDIS      ANTERIOR TIBIAL not palpable  not palpable    Abdomen: soft, NT, no palpable masses. Skin: no rashes, no cellulitis, no ulcers noted. Musculoskeletal: no muscle wasting or  atrophy.  Neurologic: A&O X 3; appropriate affect, Sensation is normal; MOTOR FUNCTION:  moving all extremities equally, motor strength 5/5 throughout. Speech is fluent/normal. CN 2-12 intact except has significant hearing loss.  Psychiatric: Thought content is normal, mood appropriate for clinical situation.     ASSESSMENT: James Dougherty is a 68 y.o. male who is s/p right great toe ray amputationby Dr. Arbie Cookey on 04/26/17.  He is also s/patherectomy right superficial femoral artery, drug-coated balloon angioplasty right superficial femoral artery, stent right superficial femoral artery (6 x 150 self-expanding), stent right external iliac artery (7 x 40) self-expandingon 04-05-17 by Dr. Darrick Penna for nonhealing wound right foot.  He is also s/pleft superficial femoral and popliteal artery recanalization and subsequent stenting on 03/05/2014by Dr. Myra Gianotti. This was done for [facility] hisleft lateral lower legwhichhas healed, he was seen in the wound clinic. On 12/27/2012 he had angioplasty of in-stent stenosis.  The patient had a stroke in 1999,denies anysubsequentstroke or TIA activity. He had difficulties with aphasia and left-sided weakness which have resolved except slight expressive aphasia.   Right great toe amputation site is well healed.  Pt is walking more, feels well.   Most recent serum creatinine result on file was 1.7 on 04-28-17.     DATA  Carotid Duplex (04-11-18): Right Carotid: Velocities in the right ICA are consistent with a 60-79%                stenosis. The ECA appears >50% stenosed. Left Carotid: Velocities in the left ICA are consistent with a 40-59% stenosis. Vertebrals:  Bilateral vertebral arteries demonstrate antegrade flow. Subclavians: Normal flow hemodynamics were seen in the right subclavian artery.  Increased velocity in the left subclavian artery, not well              visualized.  No significant change compared to the exams on  12-09-16 and 08-24-17.    Bilateral LE Arterial Duplex (04-11-18: Right Stent(s): +---------------+---------+---------------+----------++ Prox to Stent  169                     monophasic +---------------+---------+---------------+----------++ Proximal Stent 133 / 51450-99% stenosismonophasic +---------------+---------+---------------+----------++ Mid Stent      65                      monophasic +---------------+---------+---------------+----------++ Distal Stent   59                      monophasic +---------------+---------+---------------+----------++ Distal to Stent103                     monophasic +---------------+---------+---------------+----------++  Left Stent(s): +---------------+---++----------+---------------+ Prox to Stent                 +---------------+---++----------+---------------+ Proximal Stent                +---------------+---++----------+---------------+ Mid Stent                     +---------------+---++----------+---------------+ Distal Stent   plaque +---------------+---++----------+---------------+ Distal to Stent6monophasic                +---------------+---++----------+---------------+  Summary: Right: 50 - 99% stenosis in the proximal segment of the stent (514 cm/s). Left: The left stent appears patent, distal stent difficult to visualize. Significant increased stenosis proximal to the right SFA stent compared to the previous exam on 08-24-17.    ABI Findings (04-11-18): +---------+------------------+-----+----------+---------+ Right    Rt Pressure (mmHg)IndexWaveform  Comment   +---------+------------------+-----+----------+---------+ Brachial 158                                        +---------+------------------+-----+----------+---------+ PTA      84                0.53 monophasic           +---------+------------------+-----+----------+---------+ DP       66                0.42 monophasicdampened  +---------+------------------+-----+----------+---------+ Great Toe                                 amputated +---------+------------------+-----+----------+---------+  +---------+------------------+-----+----------+-------+ Left     Lt Pressure (mmHg)IndexWaveform  Comment +---------+------------------+-----+----------+-------+ Brachial 140                                      +---------+------------------+-----+----------+-------+ PTA      96                0.61 monophasic        +---------+------------------+-----+----------+-------+ DP       127               0.80 monophasic        +---------+------------------+-----+----------+-------+ Great Toe48                0.30 Abnormal          +---------+------------------+-----+----------+-------+  +-------+-----------+-----------+------------+------------+  ABI/TBIToday's ABIToday's TBIPrevious ABIPrevious TBI +-------+-----------+-----------+------------+------------+ Right  0.53       -          0.81                     +-------+-----------+-----------+------------+------------+ Left   0.80       0.30       0.92        0.64         +-------+-----------+-----------+------------+------------+   Summary: Right: Resting right ankle-brachial index indicates moderate right lower extremity arterial disease. Left: Resting left ankle-brachial index indicates mild left lower extremity arterial disease. The left toe-brachial index is abnormal. Significant decline in right ABI   PLAN:  Based on the patient's vascular studies and examination, and after discussing with Dr. Myra Gianotti, will schedule pt for arteriogram on 04-19-18 with Dr. Myra Gianotti, to address significant stenosis of right proximal SFA stent, pt with no change in claudication sx's. No tissue loss.      Carotid  duplex in 6 months.   I discussed in depth with the patient the nature of atherosclerosis, and emphasized the importance of maximal medical management including strict control of blood pressure, blood glucose, and lipid levels, obtaining regular exercise, and continued cessation of smoking.  The patient is aware that without maximal medical management the underlying atherosclerotic disease process will progress, limiting the benefit of any interventions.  The patient was given information about PAD including signs, symptoms, treatment, what symptoms should prompt the patient to seek immediate medical care, and risk reduction measures to take.  Charisse March, RN, MSN, FNP-C [facility] [place] Phone: [phone]  Clinic MD: Myra Gianotti  04/11/18 12:46 PM

## 2018-04-11 NOTE — H&P (View-Only) (Signed)
[facility] [place]   CC: Follow up peripheral artery occlusive disease  History of Present Illness James Dougherty is a 68 y.o. male who is s/p rightgreat toe ray amputationby Dr. Early on 04/26/17.Prior to amputation patient had right external iliac artery stenting as well as right SFA atherectomy and stenting by Dr. Fields in March 2019.Following arteriogram with revascularization patient had in line flow to right foot via posterior tibial and peroneal artery.Amputation site was left open and patient has home health RN changing wound VAC 3 times a week since discharge from hospital.He denies rest pain or further tissue ischemia.Past medical history significant for insulin-dependent diabetes mellitus.    Dr. Early last evaluated pt on 06-25-17. At that time there was avery small area of granulation tissue left at his open toe amputation which was clean. No surrounding erythema. He had an easily palpable popliteal pulse. Stable status post right iliac and SFA intervention with Dr. Fields and primary right great toe amputation by Dr. Early. That was completely healed at that visit. He will see us again in July as scheduled for ABIs and right leg duplex and see Chanise Habeck.  He is alsoleft superficial femoral and popliteal artery recanalization and subsequent stenting on 03/05/2014by Dr. Brabham. This was done for ulcersof hisleft lateral lower legwhichhas healed, he was seen in the wound clinic. On 12/27/2012 he had angioplasty of in-stent stenosis.  After walking 50-60 yards, both calves and thighs hurt, relieved with 30 seconds rest, this feels stable per pt.   He walks 50 yards daily.  The patient had a stroke in 1999,denies anysubsequentstroke or TIA activity. He had difficulties with aphasia and left-sided weakness which have resolved except slight expressive aphasia. He has had hearing loss since childhood.  He has generalized psoriasis  lesions that wax and wane.   He is seeing a podiatrist to trim his toenails.  Diabetic: Yes, states his last A1C was6.9 Tobacco use: former smoker, quit in 2012  Pt meds include: Statin :Yes ASA: Yes Other anticoagulants/antiplatelets: no    Past Medical History:  Diagnosis Date  . Arthritis    "hands" (12/27/2012)  . High cholesterol   . Hypertension   . Neuropathic pain   . PAD (peripheral artery disease) (HCC)   . Psoriasis   . Stroke (HCC) 1999   "still have some speech problems at times; sometimes forget what I was going to say" (12/27/2012)  . Type II diabetes mellitus (HCC)   . Ulcer    Left ankle/leg    Social History Social History   Tobacco Use  . Smoking status: Former Smoker    Packs/day: 3.00    Years: 45.00    Pack years: 135.00    Types: Cigarettes    Last attempt to quit: 01/07/2011    Years since quitting: 7.2  . Smokeless tobacco: Never Used  Substance Use Topics  . Alcohol use: Not Currently    Comment: 12/27/2012 "couple times/yr I might have a drink"  . Drug use: No    Family History Family History  Problem Relation Age of Onset  . Diabetes Sister        Bilateral amputation of lower legs  . Diabetes Sister   . Heart disease Sister 57       Heart disease before age 60  . Hypertension Sister   . Heart attack Sister   . Hyperlipidemia Sister   . Hypertension Father   . Hyperlipidemia Father   . Hyperlipidemia Mother   .   Hypertension Mother   . Hypertension Daughter     Past Surgical History:  Procedure Laterality Date  . ABDOMINAL AORTAGRAM N/A 03/30/2012   Procedure: ABDOMINAL AORTAGRAM;  Surgeon: Vance W Brabham, MD;  Location: [facility];  Service: Cardiovascular;  Laterality: N/A;  . ABDOMINAL AORTAGRAM N/A 12/27/2012   Procedure: ABDOMINAL AORTAGRAM;  Surgeon: Vance W Brabham, MD;  Location: [facility];  Service: Cardiovascular;  Laterality: N/A;  . ABDOMINAL AORTOGRAM W/LOWER EXTREMITY N/A 04/05/2017   Procedure:  ABDOMINAL AORTOGRAM W/LOWER EXTREMITY;  Surgeon: Fields, Charles E, MD;  Location: [facility];  Service: Cardiovascular;  Laterality: N/A;  . AMPUTATION Right 04/26/2017   Procedure: AMPUTATION TRANSMETATARSAL RIGHT GREAT TOE;  Surgeon: Early, Todd F, MD;  Location: [facility] OR;  Service: Vascular;  Laterality: Right;  . ANGIOPLASTY / STENTING FEMORAL Left 12/27/2012  . APPLICATION OF WOUND VAC Right 04/26/2017   Procedure: APPLICATION OF WOUND VAC;  Surgeon: Early, Todd F, MD;  Location: [facility] OR;  Service: Vascular;  Laterality: Right;  . FEMORAL ARTERY STENT  03/30/2012  . LOWER EXTREMITY ANGIOGRAM  12/27/2012   Procedure: LOWER EXTREMITY ANGIOGRAM;  Surgeon: Vance W Brabham, MD;  Location: [facility];  Service: Cardiovascular;;  . PERIPHERAL VASCULAR ATHERECTOMY Right 04/05/2017   Procedure: PERIPHERAL VASCULAR ATHERECTOMY;  Surgeon: Fields, Charles E, MD;  Location: [facility];  Service: Cardiovascular;  Laterality: Right;  superficial femoral  . PERIPHERAL VASCULAR INTERVENTION Right 04/05/2017   Procedure: PERIPHERAL VASCULAR INTERVENTION;  Surgeon: Fields, Charles E, MD;  Location: [facility];  Service: Cardiovascular;  Laterality: Right;   Superficial femorl and external iliac  . POPLITEAL ARTERY STENT    . TONSILLECTOMY AND ADENOIDECTOMY      Allergies  Allergen Reactions  . Oxycodone Nausea And Vomiting  . Glipizide     Bad headache and blurred vision  . Bactrim [Sulfamethoxazole-Trimethoprim] Itching and Rash    Current Outpatient Medications  Medication Sig Dispense Refill  . amLODipine (NORVASC) 5 MG tablet Take 1 tablet (5 mg total) by mouth daily. 30 tablet 0  . aspirin EC 81 MG tablet Take 81 mg by mouth daily.    . clopidogrel (PLAVIX) 75 MG tablet Take 1 tablet (75 mg total) by mouth daily with breakfast. 30 tablet 0  . Dulaglutide (TRULICITY) 0.75 MG/0.5ML SOPN Inject 0.75 mg into the skin once a week. Wednesdays    . gabapentin (NEURONTIN) 600 MG tablet  Take 600 mg by mouth every evening.     . lisinopril-hydrochlorothiazide (PRINZIDE,ZESTORETIC) 20-25 MG tablet Take 1 tablet by mouth daily.    . NOVOLIN 70/30 RELION (70-30) 100 UNIT/ML injection Inject 14 Units into the skin 2 (two) times daily with a meal.   12  . Podiatric Products (GOLD BOND FOOT) CREA Apply 1 application topically 2 (two) times daily as needed (DRY SKIN). DIABETIC CREAM    . simvastatin (ZOCOR) 20 MG tablet Take 20 mg by mouth at bedtime.      No current facility-administered medications for this visit.     ROS: See HPI for pertinent positives and negatives.   Physical Examination  Vitals:   04/11/18 1227  BP: (!) 163/65  Pulse: 75  Resp: 16  Temp: (!) 97.1 F (36.2 C)  TempSrc: Oral  SpO2: 97%  Weight: 251 lb 8 oz (114.1 kg)  Height: 5' 8" (1.727 m)   Body mass index is 38.24 kg/m.  General: A&O x 3, WDWN, obese male. Gait: normal HENT: No   gross abnormalities.  Eyes: PERRLA. Pulmonary: Respirations are non labored, CTAB, good air movement in all fields Cardiac: regular rhythm, no detected murmur.         Carotid Bruits Right Left   Positive Positive   Radial pulses are 2+ palpable bilaterally   Adominal aortic pulse is not palpable                         VASCULAR EXAM: Extremities without ischemic changes, without Gangrene; without open wounds. Right great toe is surgically absent, well healed right great toe amputation site.                                                                                                           LE Pulses Right Left       FEMORAL  not palpable seated, unable to get on exam table  not palpable        POPLITEAL  not palpable   not palpable       POSTERIOR TIBIAL  not palpable   not palpable        DORSALIS PEDIS      ANTERIOR TIBIAL not palpable  not palpable    Abdomen: soft, NT, no palpable masses. Skin: no rashes, no cellulitis, no ulcers noted. Musculoskeletal: no muscle wasting or  atrophy.  Neurologic: A&O X 3; appropriate affect, Sensation is normal; MOTOR FUNCTION:  moving all extremities equally, motor strength 5/5 throughout. Speech is fluent/normal. CN 2-12 intact except has significant hearing loss.  Psychiatric: Thought content is normal, mood appropriate for clinical situation.     ASSESSMENT: James Dougherty is a 67 y.o. male who is s/p right great toe ray amputationby Dr. Early on 04/26/17.  He is also s/patherectomy right superficial femoral artery, drug-coated balloon angioplasty right superficial femoral artery, stent right superficial femoral artery (6 x 150 self-expanding), stent right external iliac artery (7 x 40) self-expandingon 04-05-17 by Dr. Fields for nonhealing wound right foot.  He is also s/pleft superficial femoral and popliteal artery recanalization and subsequent stenting on 03/05/2014by Dr. Brabham. This was done for ulcersof hisleft lateral lower legwhichhas healed, he was seen in the wound clinic. On 12/27/2012 he had angioplasty of in-stent stenosis.  The patient had a stroke in 1999,denies anysubsequentstroke or TIA activity. He had difficulties with aphasia and left-sided weakness which have resolved except slight expressive aphasia.   Right great toe amputation site is well healed.  Pt is walking more, feels well.   Most recent serum creatinine result on file was 1.7 on 04-28-17.     DATA  Carotid Duplex (04-11-18): Right Carotid: Velocities in the right ICA are consistent with a 60-79%                stenosis. The ECA appears >50% stenosed. Left Carotid: Velocities in the left ICA are consistent with a 40-59% stenosis. Vertebrals:  Bilateral vertebral arteries demonstrate antegrade flow. Subclavians: Normal flow hemodynamics were seen in the right subclavian artery.                Increased velocity in the left subclavian artery, not well              visualized.  No significant change compared to the exams on  12-09-16 and 08-24-17.    Bilateral LE Arterial Duplex (04-11-18: Right Stent(s): +---------------+---------+---------------+----------++ Prox to Stent  169                     monophasic +---------------+---------+---------------+----------++ Proximal Stent 133 / 51450-99% stenosismonophasic +---------------+---------+---------------+----------++ Mid Stent      65                      monophasic +---------------+---------+---------------+----------++ Distal Stent   59                      monophasic +---------------+---------+---------------+----------++ Distal to Stent103                     monophasic +---------------+---------+---------------+----------++  Left Stent(s): +---------------+---++----------+---------------+ Prox to Stent  144monophasic                +---------------+---++----------+---------------+ Proximal Stent 226monophasic                +---------------+---++----------+---------------+ Mid Stent      180monophasic                +---------------+---++----------+---------------+ Distal Stent   206monophasiccalcific plaque +---------------+---++----------+---------------+ Distal to Stent137monophasic                +---------------+---++----------+---------------+  Summary: Right: 50 - 99% stenosis in the proximal segment of the stent (514 cm/s). Left: The left stent appears patent, distal stent difficult to visualize. Significant increased stenosis proximal to the right SFA stent compared to the previous exam on 08-24-17.    ABI Findings (04-11-18): +---------+------------------+-----+----------+---------+ Right    Rt Pressure (mmHg)IndexWaveform  Comment   +---------+------------------+-----+----------+---------+ Brachial 158                                        +---------+------------------+-----+----------+---------+ PTA      84                0.53 monophasic           +---------+------------------+-----+----------+---------+ DP       66                0.42 monophasicdampened  +---------+------------------+-----+----------+---------+ Great Toe                                 amputated +---------+------------------+-----+----------+---------+  +---------+------------------+-----+----------+-------+ Left     Lt Pressure (mmHg)IndexWaveform  Comment +---------+------------------+-----+----------+-------+ Brachial 140                                      +---------+------------------+-----+----------+-------+ PTA      96                0.61 monophasic        +---------+------------------+-----+----------+-------+ DP       127               0.80 monophasic        +---------+------------------+-----+----------+-------+ Great Toe48                0.30 Abnormal          +---------+------------------+-----+----------+-------+  +-------+-----------+-----------+------------+------------+   ABI/TBIToday's ABIToday's TBIPrevious ABIPrevious TBI +-------+-----------+-----------+------------+------------+ Right  0.53       -          0.81                     +-------+-----------+-----------+------------+------------+ Left   0.80       0.30       0.92        0.64         +-------+-----------+-----------+------------+------------+   Summary: Right: Resting right ankle-brachial index indicates moderate right lower extremity arterial disease. Left: Resting left ankle-brachial index indicates mild left lower extremity arterial disease. The left toe-brachial index is abnormal. Significant decline in right ABI   PLAN:  Based on the patient's vascular studies and examination, and after discussing with Dr. Brabham, will schedule pt for arteriogram on 04-19-18 with Dr. Brabham, to address significant stenosis of right proximal SFA stent, pt with no change in claudication sx's. No tissue loss.      Carotid  duplex in 6 months.   I discussed in depth with the patient the nature of atherosclerosis, and emphasized the importance of maximal medical management including strict control of blood pressure, blood glucose, and lipid levels, obtaining regular exercise, and continued cessation of smoking.  The patient is aware that without maximal medical management the underlying atherosclerotic disease process will progress, limiting the benefit of any interventions.  The patient was given information about PAD including signs, symptoms, treatment, what symptoms should prompt the patient to seek immediate medical care, and risk reduction measures to take.  Edmonia Gonser, RN, MSN, FNP-C [facility] [place] Office Phone: [phone]  Clinic MD: Brabham  04/11/18 12:46 PM    

## 2018-04-11 NOTE — Patient Instructions (Signed)
Stroke Prevention Some medical conditions and lifestyle choices can lead to a higher risk for a stroke. You can help to prevent a stroke by making nutrition, lifestyle, and other changes. What nutrition changes can be made?   Eat healthy foods. ? Choose foods that are high in fiber. These include:  Fresh fruits.  Fresh vegetables.  Whole grains. ? Eat at least 5 or more servings of fruits and vegetables each day. Try to fill half of your plate at each meal with fruits and vegetables. ? Choose lean protein foods. These include:  Lowfat (lean) cuts of meat.  Chicken without skin.  Fish.  Tofu.  Beans.  Nuts. ? Eat low-fat dairy products. ? Avoid foods that:  Are high in salt (sodium).  Have saturated fat.  Have trans fat.  Have cholesterol.  Are processed.  Are premade.  Follow eating guidelines as told by your doctor. These may include: ? Reducing how many calories you eat and drink each day. ? Limiting how much salt you eat or drink each day to 1,500 milligrams (mg). ? Using only healthy fats for cooking. These include:  Olive oil.  Canola oil.  Sunflower oil. ? Counting how many carbohydrates you eat and drink each day. What lifestyle changes can be made?  Try to stay at a healthy weight. Talk to your doctor about what a good weight is for you.  Get at least 30 minutes of moderate physical activity at least 5 days a week. This can include: ? Fast walking. ? Biking. ? Swimming.  Do not use any products that have nicotine or tobacco. This includes cigarettes and e-cigarettes. If you need help quitting, ask your doctor. Avoid being around tobacco smoke in general.  Limit how much alcohol you drink to no more than 1 drink a day for nonpregnant women and 2 drinks a day for men. One drink equals 12 oz of beer, 5 oz of wine, or 1 oz of hard liquor.  Do not use drugs.  Avoid taking birth control pills. Talk to your doctor about the risks of taking birth  control pills if: ? You are over 35 years old. ? You smoke. ? You get migraines. ? You have had a blood clot. What other changes can be made?  Manage your cholesterol. ? It is important to eat a healthy diet. ? If your cholesterol cannot be managed through your diet, you may also need to take medicines. Take medicines as told by your doctor.  Manage your diabetes. ? It is important to eat a healthy diet and to exercise regularly. ? If your blood sugar cannot be managed through diet and exercise, you may need to take medicines. Take medicines as told by your doctor.  Control your high blood pressure (hypertension). ? Try to keep your blood pressure below 130/80. This can help lower your risk of stroke. ? It is important to eat a healthy diet and to exercise regularly. ? If your blood pressure cannot be managed through diet and exercise, you may need to take medicines. Take medicines as told by your doctor. ? Ask your doctor if you should check your blood pressure at home. ? Have your blood pressure checked every year. Do this even if your blood pressure is normal.  Talk to your doctor about getting checked for a sleep disorder. Signs of this can include: ? Snoring a lot. ? Feeling very tired.  Take over-the-counter and prescription medicines only as told by your doctor. These may   include aspirin or blood thinners (antiplatelets or anticoagulants).  Make sure that any other medical conditions you have are managed. Where to find more information  American Stroke Association: www.strokeassociation.org  National Stroke Association: www.stroke.org Get help right away if:  You have any symptoms of stroke. "BE FAST" is an easy way to remember the main warning signs: ? B - Balance. Signs are dizziness, sudden trouble walking, or loss of balance. ? E - Eyes. Signs are trouble seeing or a sudden change in how you see. ? F - Face. Signs are sudden weakness or loss of feeling of the face,  or the face or eyelid drooping on one side. ? A - Arms. Signs are weakness or loss of feeling in an arm. This happens suddenly and usually on one side of the body. ? S - Speech. Signs are sudden trouble speaking, slurred speech, or trouble understanding what people say. ? T - Time. Time to call emergency services. Write down what time symptoms started.  You have other signs of stroke, such as: ? A sudden, very bad headache with no known cause. ? Feeling sick to your stomach (nausea). ? Throwing up (vomiting). ? Jerky movements you cannot control (seizure). These symptoms may represent a serious problem that is an emergency. Do not wait to see if the symptoms will go away. Get medical help right away. Call your local emergency services (911 in the [place] Do not drive yourself to the hospital. Summary  You can prevent a stroke by eating healthy, exercising, not smoking, drinking less alcohol, and treating other health problems, such as diabetes, high blood pressure, or high cholesterol.  Do not use any products that contain nicotine or tobacco, such as cigarettes and e-cigarettes.  Get help right away if you have any signs or symptoms of a stroke. This information is not intended to replace advice given to you by your health care provider. Make sure you discuss any questions you have with your health care provider. Document Released: 07/14/2011 Document Revised: 04/15/2016 Document Reviewed: 04/15/2016 Elsevier Interactive Patient Education  2019 Elsevier Inc.     Peripheral Vascular Disease  Peripheral vascular disease (PVD) is a disease of the blood vessels that are not part of your heart and brain. A simple term for PVD is poor circulation. In most cases, PVD narrows the blood vessels that carry blood from your heart to the rest of your body. This can reduce the supply of blood to your arms, legs, and internal organs, like your stomach or kidneys. However, PVD most often affects a  person's lower legs and feet. Without treatment, PVD tends to get worse. PVD can also lead to acute ischemic limb. This is when an arm or leg suddenly cannot get enough blood. This is a medical emergency. Follow these instructions at home: Lifestyle  Do not use any products that contain nicotine or tobacco, such as cigarettes and e-cigarettes. If you need help quitting, ask your doctor.  Lose weight if you are overweight. Or, stay at a healthy weight as told by your doctor.  Eat a diet that is low in fat and cholesterol. If you need help, ask your doctor.  Exercise regularly. Ask your doctor for activities that are right for you. General instructions  Take over-the-counter and prescription medicines only as told by your doctor.  Take good care of your feet: ? Wear comfortable shoes that fit well. ? Check your feet often for any cuts or sores.  Keep all follow-up visits as   told by your doctor This is important. Contact a doctor if:  You have cramps in your legs when you walk.  You have leg pain when you are at rest.  You have coldness in a leg or foot.  Your skin changes.  You are unable to get or have an erection (erectile dysfunction).  You have cuts or sores on your feet that do not heal. Get help right away if:  Your arm or leg turns cold, numb, and blue.  Your arms or legs become red, warm, swollen, painful, or numb.  You have chest pain.  You have trouble breathing.  You suddenly have weakness in your face, arm, or leg.  You become very confused or you cannot speak.  You suddenly have a very bad headache.  You suddenly cannot see. Summary  Peripheral vascular disease (PVD) is a disease of the blood vessels.  A simple term for PVD is poor circulation. Without treatment, PVD tends to get worse.  Treatment may include exercise, low fat and low cholesterol diet, and quitting smoking. This information is not intended to replace advice given to you by your  health care provider. Make sure you discuss any questions you have with your health care provider. Document Released: 04/08/2009 Document Revised: 02/20/2016 Document Reviewed: 02/20/2016 Elsevier Interactive Patient Education  2019 Elsevier Inc.  

## 2018-04-18 ENCOUNTER — Telehealth: Payer: Self-pay | Admitting: *Deleted

## 2018-04-18 NOTE — Telephone Encounter (Signed)
Call to patient. Instructed to be at [facility] admitting department at 7:15 am on 3/24/

## 2018-04-19 ENCOUNTER — Ambulatory Visit (HOSPITAL_COMMUNITY)
Admission: RE | Admit: 2018-04-19 | Discharge: 2018-04-19 | Disposition: A | Discharge status: Home / Self Care | Payer: Medicare Other | Attending: Surgery | Admitting: Surgery

## 2018-04-19 ENCOUNTER — Encounter (HOSPITAL_COMMUNITY)
Admission: RE | Disposition: A | Discharge status: Home / Self Care | Payer: Self-pay | Source: Home / Self Care | Attending: Surgery

## 2018-04-19 ENCOUNTER — Other Ambulatory Visit: Payer: Self-pay

## 2018-04-19 DIAGNOSIS — Z87891 Personal history of nicotine dependence: Secondary | ICD-10-CM | POA: Diagnosis not present

## 2018-04-19 DIAGNOSIS — I10 Essential (primary) hypertension: Secondary | ICD-10-CM | POA: Diagnosis not present

## 2018-04-19 DIAGNOSIS — Z8673 Personal history of transient ischemic attack (TIA), and cerebral infarction without residual deficits: Secondary | ICD-10-CM | POA: Diagnosis not present

## 2018-04-19 DIAGNOSIS — Z7982 Long term (current) use of aspirin: Secondary | ICD-10-CM | POA: Insufficient documentation

## 2018-04-19 DIAGNOSIS — Z87820 Personal history of traumatic brain injury: Secondary | ICD-10-CM | POA: Insufficient documentation

## 2018-04-19 DIAGNOSIS — E7800 Pure hypercholesterolemia, unspecified: Secondary | ICD-10-CM | POA: Diagnosis not present

## 2018-04-19 DIAGNOSIS — Z794 Long term (current) use of insulin: Secondary | ICD-10-CM | POA: Insufficient documentation

## 2018-04-19 DIAGNOSIS — Z79899 Other long term (current) drug therapy: Secondary | ICD-10-CM | POA: Diagnosis not present

## 2018-04-19 DIAGNOSIS — E1151 Type 2 diabetes mellitus with diabetic peripheral angiopathy without gangrene: Secondary | ICD-10-CM | POA: Diagnosis not present

## 2018-04-19 DIAGNOSIS — Z885 Allergy status to narcotic agent status: Secondary | ICD-10-CM | POA: Diagnosis not present

## 2018-04-19 DIAGNOSIS — M1990 Unspecified osteoarthritis, unspecified site: Secondary | ICD-10-CM | POA: Insufficient documentation

## 2018-04-19 DIAGNOSIS — T82856A Stenosis of peripheral vascular stent, initial encounter: Secondary | ICD-10-CM | POA: Diagnosis not present

## 2018-04-19 DIAGNOSIS — Z881 Allergy status to other antibiotic agents status: Secondary | ICD-10-CM | POA: Diagnosis not present

## 2018-04-19 HISTORY — PX: LOWER EXTREMITY ANGIOGRAPHY: CATH118251

## 2018-04-19 HISTORY — PX: PERIPHERAL VASCULAR BALLOON ANGIOPLASTY: CATH118281

## 2018-04-19 LAB — POCT I-STAT 4, (NA,K, GLUC, HGB,HCT)
Glucose, Bld: 203 mg/dL — ABNORMAL HIGH (ref 70–99)
HCT: 40 % (ref 39.0–52.0)
Hemoglobin: 13.6 g/dL (ref 13.0–17.0)
Potassium: 5.2 mmol/L — ABNORMAL HIGH (ref 3.5–5.1)
Sodium: 138 mmol/L (ref 135–145)

## 2018-04-19 LAB — POCT I-STAT CREATININE: CREATININE: 1.7 mg/dL — AB (ref 0.61–1.24)

## 2018-04-19 LAB — GLUCOSE, CAPILLARY: Glucose-Capillary: 172 mg/dL — ABNORMAL HIGH (ref 70–99)

## 2018-04-19 SURGERY — LOWER EXTREMITY ANGIOGRAPHY
Anesthesia: LOCAL

## 2018-04-19 MED ORDER — MIDAZOLAM HCL 2 MG/2ML IJ SOLN
INTRAMUSCULAR | Status: DC | PRN
  Administered 2018-04-19 (×2): 1 mg via INTRAVENOUS

## 2018-04-19 MED ORDER — LIDOCAINE HCL (PF) 1 % IJ SOLN
INTRAMUSCULAR | Status: DC | PRN
  Administered 2018-04-19: 18 mL

## 2018-04-19 MED ORDER — MORPHINE SULFATE (PF) 2 MG/ML IV SOLN: 2.0000 mg | INTRAVENOUS | Status: DC | PRN

## 2018-04-19 MED ORDER — SODIUM CHLORIDE 0.9 % WEIGHT BASED INFUSION: 1.0000 mL/kg/h | INTRAVENOUS | Status: DC

## 2018-04-19 MED ORDER — FENTANYL CITRATE (PF) 100 MCG/2ML IJ SOLN
INTRAMUSCULAR | Status: DC | PRN
  Administered 2018-04-19 (×2): 25 ug via INTRAVENOUS

## 2018-04-19 MED ORDER — HEPARIN SODIUM (PORCINE) 1000 UNIT/ML IJ SOLN
INTRAMUSCULAR | Status: AC
  Filled 2018-04-19: qty 1

## 2018-04-19 MED ORDER — ONDANSETRON HCL 4 MG/2ML IJ SOLN: 4.0000 mg | Freq: Four times a day (QID) | INTRAMUSCULAR | Status: DC | PRN

## 2018-04-19 MED ORDER — HEPARIN (PORCINE) IN NACL 1000-0.9 UT/500ML-% IV SOLN
INTRAVENOUS | Status: DC | PRN
  Administered 2018-04-19 (×2): 500 mL

## 2018-04-19 MED ORDER — SODIUM CHLORIDE 0.9 % IV SOLN: 250.0000 mL | INTRAVENOUS | Status: DC | PRN

## 2018-04-19 MED ORDER — MIDAZOLAM HCL 2 MG/2ML IJ SOLN
INTRAMUSCULAR | Status: AC
  Filled 2018-04-19: qty 2

## 2018-04-19 MED ORDER — LABETALOL HCL 5 MG/ML IV SOLN: 10.0000 mg | INTRAVENOUS | Status: DC | PRN

## 2018-04-19 MED ORDER — LIDOCAINE HCL (PF) 1 % IJ SOLN
INTRAMUSCULAR | Status: AC
  Filled 2018-04-19: qty 30

## 2018-04-19 MED ORDER — IODIXANOL 320 MG/ML IV SOLN
INTRAVENOUS | Status: DC | PRN
  Administered 2018-04-19: 60 mL via INTRA_ARTERIAL

## 2018-04-19 MED ORDER — ACETAMINOPHEN 325 MG PO TABS: 650.0000 mg | ORAL_TABLET | ORAL | Status: DC | PRN

## 2018-04-19 MED ORDER — SODIUM CHLORIDE 0.9% FLUSH: 3.0000 mL | Freq: Two times a day (BID) | INTRAVENOUS | Status: DC

## 2018-04-19 MED ORDER — HEPARIN SODIUM (PORCINE) 1000 UNIT/ML IJ SOLN
INTRAMUSCULAR | Status: DC | PRN
  Administered 2018-04-19: 9000 [IU] via INTRAVENOUS

## 2018-04-19 MED ORDER — HEPARIN (PORCINE) IN NACL 1000-0.9 UT/500ML-% IV SOLN
INTRAVENOUS | Status: AC
  Filled 2018-04-19: qty 1000

## 2018-04-19 MED ORDER — SODIUM CHLORIDE 0.9 % IV SOLN
INTRAVENOUS | Status: DC
  Administered 2018-04-19: 08:00:00 via INTRAVENOUS

## 2018-04-19 MED ORDER — FENTANYL CITRATE (PF) 100 MCG/2ML IJ SOLN
INTRAMUSCULAR | Status: AC
  Filled 2018-04-19: qty 2

## 2018-04-19 MED ORDER — HYDRALAZINE HCL 20 MG/ML IJ SOLN: 5.0000 mg | INTRAMUSCULAR | Status: DC | PRN

## 2018-04-19 MED ORDER — SODIUM CHLORIDE 0.9% FLUSH: 3.0000 mL | INTRAVENOUS | Status: DC | PRN

## 2018-04-19 SURGICAL SUPPLY — 19 items
BALLN MUSTANG 6.0X40 135 (BALLOONS) ×3
BALLOON MUSTANG 6.0X40 135 (BALLOONS) ×2 IMPLANT
CATH OMNI FLUSH 5F 65CM (CATHETERS) ×3 IMPLANT
CLOSURE MYNX CONTROL 6F/7F (Vascular Products) ×3 IMPLANT
FILTER CO2 0.2 MICRON (VASCULAR PRODUCTS) ×6 IMPLANT
KIT ENCORE 26 ADVANTAGE (KITS) ×3 IMPLANT
KIT MICROPUNCTURE NIT STIFF (SHEATH) ×3 IMPLANT
KIT PV (KITS) ×3 IMPLANT
RESERVOIR CO2 (VASCULAR PRODUCTS) ×3 IMPLANT
SET FLUSH CO2 (MISCELLANEOUS) ×3 IMPLANT
SHEATH PINNACLE 5F 10CM (SHEATH) ×3 IMPLANT
SHEATH PINNACLE 6F 10CM (SHEATH) ×3 IMPLANT
SHEATH PINNACLE ST 6F 45CM (SHEATH) ×3 IMPLANT
SHEATH PROBE COVER 6X72 (BAG) ×3 IMPLANT
SYR MEDRAD MARK V 150ML (SYRINGE) ×3 IMPLANT
TRANSDUCER W/STOPCOCK (MISCELLANEOUS) ×3 IMPLANT
TRAY PV CATH (CUSTOM PROCEDURE TRAY) ×3 IMPLANT
WIRE BENTSON .035X145CM (WIRE) ×3 IMPLANT
WIRE HI TORQ VERSACORE J 260CM (WIRE) ×3 IMPLANT

## 2018-04-19 NOTE — Op Note (Signed)
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  Ultrasound-guided access, left femoral artery  2.  Abdominal aortogram with CO2  3.  Bilateral lower extremity runoff  4.  Balloon angioplasty, right superficial femoral artery  5.  Conscious sedation of (41 minutes)  6.  Closure device (Mynx)    Indications: Patient has previously undergone bilateral lower extremity percutaneous revascularization for ulcers.  He does not have any residual open a wound but ultrasound identified a high-grade lesion within the right superficial femoral artery.  He is here today for further evaluation.  Procedure:  The patient was identified in the holding area and taken to room 8.  The patient was then placed supine on the table and prepped and draped in the usual sterile fashion.  A time out was called.  Conscious sedation was administered with the use of IV fentanyl and Versed under continuous physician and nurse monitoring.  Heart rate, blood pressure, and oxygen saturation were continuously monitored.  Total sedation time was 41 minutes.  Ultrasound was used to evaluate the left common femoral artery.  It was patent .  A digital ultrasound image was acquired.  A micropuncture needle was used to access the left common femoral artery under ultrasound guidance.  An 018 wire was advanced without resistance and a micropuncture sheath was placed.  The 018 wire was removed and a benson wire was placed.  The micropuncture sheath was exchanged for a 5 french sheath.  An omniflush catheter was advanced over the wire to the level of L-1.  An abdominal angiogram was obtained with CO2.  Next, the Omni Flush catheter was pulled down to the aortic bifurcation and bilateral lower extremity runoff was performed.  Next, using the omniflush catheter and a benson  wire, the aortic bifurcation was crossed and the catheter was placed into theright external iliac artery and right runoff was obtained.   Findings:   Aortogram: No hemodynamically significant stenosis within the bilateral renal arteries.  The infrarenal abdominal aorta is widely patent.  Stent in the right common iliac artery is widely patent.  The stent in the right external iliac artery is widely patent.  The left common and external iliac arteries are widely patent.  Right Lower Extremity: The right common femoral artery is widely patent.  Diffuse luminal irregularity within the right profundofemoral artery without high-grade lesion.  The stents within the right superficial femoral artery are patent however in the proximal portion of the thigh there is a 90% in-stent stenosis.  Popliteal arteries pain throughout his course.  There is two-vessel runoff via the posterior tibial peroneal artery.  Left Lower Extremity: The left common femoral profundofemoral artery widely patent.  The stents within the left superficial femoral artery remain widely patent.  Intervention: After evaluations were acquired decision was made to proceed with intervention.  Over an 035 wire, a 6 French 45 cm sheath was advanced into the right external iliac artery.  The patient is fully heparinized.  A versa core wire was advanced across the lesion.  A 6 x 40 Mustang balloon was selected and primary balloon angioplasty was performed of the stenotic area for 30 seconds at nominal pressure.  Follow-up imaging revealed resolution of the stenosis.  Next, the long sheath was exchanged out for a short 6 French sheath and a minx device was used for closure.  There were no immediate complications.  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  #2  A total of 60 cc of contrast was utilized  #3  Left superficial femoral artery stents  remain widely patent  #4  Right common and external iliac artery stents are widely patent   V. Durene Cal, M.D., [facility] Office: [phone] Pager:  [phone]

## 2018-04-19 NOTE — Interval H&P Note (Signed)
History and Physical Interval Note:  04/19/2018 9:10 AM  James Dougherty  has presented today for surgery, with the diagnosis of pad.  The various methods of treatment have been discussed with the patient and family. After consideration of risks, benefits and other options for treatment, the patient has consented to  Procedure(s): LOWER EXTREMITY ANGIOGRAPHY (N/A) as a surgical intervention.  The patient's history has been reviewed, patient examined, no change in status, stable for surgery.  I have reviewed the patient's chart and labs.  Questions were answered to the patient's satisfaction.     Durene Cal

## 2018-04-19 NOTE — Progress Notes (Signed)
Up and walked and tolerated well; left groin stable, no bleeding or hematoma 

## 2018-04-19 NOTE — Discharge Instructions (Signed)
Femoral Site Care °This sheet gives you information about how to care for yourself after your procedure. Your health care provider may also give you more specific instructions. If you have problems or questions, contact your health care provider. °What can I expect after the procedure? °After the procedure, it is common to have: °· Bruising that usually fades within 1-2 weeks. °· Tenderness at the site. °Follow these instructions at home: °Wound care °· Follow instructions from your health care provider about how to take care of your insertion site. Make sure you: °? Wash your hands with soap and water before you change your bandage (dressing). If soap and water are not available, use hand sanitizer. °? Change your dressing as told by your health care provider. °? Leave stitches (sutures), skin glue, or adhesive strips in place. These skin closures may need to stay in place for 2 weeks or longer. If adhesive strip edges start to loosen and curl up, you may trim the loose edges. Do not remove adhesive strips completely unless your health care provider tells you to do that. °· Do not take baths, swim, or use a hot tub until your health care provider approves. °· You may shower 24-48 hours after the procedure or as told by your health care provider. °? Gently wash the site with plain soap and water. °? Pat the area dry with a clean towel. °? Do not rub the site. This may cause bleeding. °· Do not apply powder or lotion to the site. Keep the site clean and dry. °· Check your femoral site every day for signs of infection. Check for: °? Redness, swelling, or pain. °? Fluid or blood. °? Warmth. °? Pus or a bad smell. °Activity °· For the first 2-3 days after your procedure, or as long as directed: °? Avoid climbing stairs as much as possible. °? Do not squat. °· Do not lift anything that is heavier than 10 lb (4.5 kg), or the limit that you are told, until your health care provider says that it is safe. °· Rest as  directed. °? Avoid sitting for a long time without moving. Get up to take short walks every 1-2 hours. °· Do not drive for 24 hours if you were given a medicine to help you relax (sedative). °General instructions °· Take over-the-counter and prescription medicines only as told by your health care provider. °· Keep all follow-up visits as told by your health care provider. This is important. °Contact a health care provider if you have: °· A fever or chills. °· You have redness, swelling, or pain around your insertion site. °Get help right away if: °· The catheter insertion area swells very fast. °· You pass out. °· You suddenly start to sweat or your skin gets clammy. °· The catheter insertion area is bleeding, and the bleeding does not stop when you hold steady pressure on the area. °· The area near or just beyond the catheter insertion site becomes pale, cool, tingly, or numb. °These symptoms may represent a serious problem that is an emergency. Do not wait to see if the symptoms will go away. Get medical help right away. Call your local emergency services (911 in the [place] Do not drive yourself to the hospital. °Summary °· After the procedure, it is common to have bruising that usually fades within 1-2 weeks. °· Check your femoral site every day for signs of infection. °· Do not lift anything that is heavier than 10 lb (4.5 kg), or the   limit that you are told, until your health care provider says that it is safe. °This information is not intended to replace advice given to you by your health care provider. Make sure you discuss any questions you have with your health care provider. °Document Released: 09/15/2013 Document Revised: 01/25/2017 Document Reviewed: 01/25/2017 °Elsevier Interactive Patient Education © 2019 Elsevier Inc. ° °Moderate Conscious Sedation, Adult, Care After °These instructions provide you with information about caring for yourself after your procedure. Your health care provider may also give  you more specific instructions. Your treatment has been planned according to current medical practices, but problems sometimes occur. Call your health care provider if you have any problems or questions after your procedure. °What can I expect after the procedure? °After your procedure, it is common: °· To feel sleepy for several hours. °· To feel clumsy and have poor balance for several hours. °· To have poor judgment for several hours. °· To vomit if you eat too soon. °Follow these instructions at home: °For at least 24 hours after the procedure: ° °· Do not: °? Participate in activities where you could fall or become injured. °? Drive. °? Use heavy machinery. °? Drink alcohol. °? Take sleeping pills or medicines that cause drowsiness. °? Make important decisions or sign legal documents. °? Take care of children on your own. °· Rest. °Eating and drinking °· Follow the diet recommended by your health care provider. °· If you vomit: °? Drink water, juice, or soup when you can drink without vomiting. °? Make sure you have little or no nausea before eating solid foods. °General instructions °· Have a responsible adult stay with you until you are awake and alert. °· Take over-the-counter and prescription medicines only as told by your health care provider. °· If you smoke, do not smoke without supervision. °· Keep all follow-up visits as told by your health care provider. This is important. °Contact a health care provider if: °· You keep feeling nauseous or you keep vomiting. °· You feel light-headed. °· You develop a rash. °· You have a fever. °Get help right away if: °· You have trouble breathing. °This information is not intended to replace advice given to you by your health care provider. Make sure you discuss any questions you have with your health care provider. °Document Released: 11/02/2012 Document Revised: 06/17/2015 Document Reviewed: 05/04/2015 °Elsevier Interactive Patient Education © 2019 Elsevier  Inc. ° °

## 2018-04-20 ENCOUNTER — Encounter (HOSPITAL_COMMUNITY): Payer: Self-pay | Admitting: Surgery

## 2018-04-27 DIAGNOSIS — E782 Mixed hyperlipidemia: Secondary | ICD-10-CM | POA: Diagnosis not present

## 2018-04-27 DIAGNOSIS — E7800 Pure hypercholesterolemia, unspecified: Secondary | ICD-10-CM | POA: Diagnosis not present

## 2018-04-27 DIAGNOSIS — E1122 Type 2 diabetes mellitus with diabetic chronic kidney disease: Secondary | ICD-10-CM | POA: Diagnosis not present

## 2018-04-27 DIAGNOSIS — E1142 Type 2 diabetes mellitus with diabetic polyneuropathy: Secondary | ICD-10-CM | POA: Diagnosis not present

## 2018-04-27 DIAGNOSIS — K219 Gastro-esophageal reflux disease without esophagitis: Secondary | ICD-10-CM | POA: Diagnosis not present

## 2018-05-03 DIAGNOSIS — I739 Peripheral vascular disease, unspecified: Secondary | ICD-10-CM | POA: Diagnosis not present

## 2018-05-03 DIAGNOSIS — E1140 Type 2 diabetes mellitus with diabetic neuropathy, unspecified: Secondary | ICD-10-CM | POA: Diagnosis not present

## 2018-05-03 DIAGNOSIS — I10 Essential (primary) hypertension: Secondary | ICD-10-CM | POA: Diagnosis not present

## 2018-05-03 DIAGNOSIS — E782 Mixed hyperlipidemia: Secondary | ICD-10-CM | POA: Diagnosis not present

## 2018-05-03 DIAGNOSIS — E875 Hyperkalemia: Secondary | ICD-10-CM | POA: Diagnosis not present

## 2018-05-03 DIAGNOSIS — E1122 Type 2 diabetes mellitus with diabetic chronic kidney disease: Secondary | ICD-10-CM | POA: Diagnosis not present

## 2018-05-11 DIAGNOSIS — E875 Hyperkalemia: Secondary | ICD-10-CM | POA: Diagnosis not present

## 2018-06-29 DIAGNOSIS — E1151 Type 2 diabetes mellitus with diabetic peripheral angiopathy without gangrene: Secondary | ICD-10-CM | POA: Diagnosis not present

## 2018-06-29 DIAGNOSIS — M79672 Pain in left foot: Secondary | ICD-10-CM | POA: Diagnosis not present

## 2018-06-29 DIAGNOSIS — M79671 Pain in right foot: Secondary | ICD-10-CM | POA: Diagnosis not present

## 2018-06-29 DIAGNOSIS — B351 Tinea unguium: Secondary | ICD-10-CM | POA: Diagnosis not present

## 2018-07-18 ENCOUNTER — Other Ambulatory Visit: Payer: Self-pay

## 2018-07-18 NOTE — Patient Outreach (Signed)
[facility] [facility]) Care Management  07/18/2018  KALEM ROCKWELL [birth year] [id number]   Medication Adherence call to Mr. Matias Thurman  HIPPA Compliant Voice message left with a call back number. Mr. Timbrook is showing past due on Lisinopril/Hctz 20/25 mg under [place].    [facility] Management Direct Dial [phone]  Fax [phone] [email or web address]

## 2018-07-26 DIAGNOSIS — I10 Essential (primary) hypertension: Secondary | ICD-10-CM | POA: Diagnosis not present

## 2018-07-26 DIAGNOSIS — E782 Mixed hyperlipidemia: Secondary | ICD-10-CM | POA: Diagnosis not present

## 2018-08-01 ENCOUNTER — Other Ambulatory Visit: Payer: Self-pay | Admitting: Pharmacy Technician

## 2018-08-01 NOTE — Patient Outreach (Signed)
[facility] [facility]) Care Management  08/01/2018  [name] [birth year] [id number]  ADDENDUM  Care coordination call placed to Dr. Edythe Lynn office. Left voicemail on refill line requesting that the provider's representative send in a prescription to [facility] as patient is almost out of Trulicity.  Care coordination call placed to [place] at [facility]. She informed that they have previously received a Trulicity request from the provider and the medication is ready for delivery. She informed they would outreach patient, but that patient could call into them to set up delivery. Patient previously provided with number to both [facility] and [employer].  James Dougherty, [place] Management [phone]

## 2018-08-01 NOTE — Patient Outreach (Signed)
[facility]) Care Management  08/01/2018  James Dougherty 07-Jul-1950 [id number]   Incoming call received from patient in regards to [place] refill for Trulicity.  Spoke to patient, HIPAA identifiers verified.  Patient informed he had requested his provider to call in his refill to [facility] but he informed they have not received any medication yet.  Informed patient that [name] has changed how they deliver the medications. Informed patient to call [facility] by Wednesday if he has not heard from them before hand.  Will outreach provider and [facility] about the refill request.  Katlin Ciszewski P. Tniya Bowditch, [place] Management [phone]

## 2018-08-10 ENCOUNTER — Other Ambulatory Visit: Payer: Self-pay | Admitting: Pharmacist

## 2018-08-10 NOTE — Patient Outreach (Signed)
[facility] [facility]) Care Management  08/10/2018  ETHON WYMER 1950/05/12 [id number]  Receive voicemail message from Mr. Kobayashi requesting a call back.  Note patient spoke with [facility] CPhT Sharee Pimple Simcox last week regarding his enrollment in the [employer] Patient Assistance Program for [employer].   Called and spoke with patient today. HIPAA identifiers verified. Mr. Perfect reports that he received Trulicity from the [employer] (picked up from his PCP's office) yesterday, but that he was only sent a 1 month supply (4 pens).   Advise patient to call to follow up with [facility] in Dr. Edythe Lynn office regarding the quantity on the prescription that Dr. Quintin Alto sent into [facility]. Confirm that patient also has the phone number for [facility] to call as needed for further information/refills.  Patient denies any further medication questions/concerns at this time. Confirms that he has phone numbers for myself and [place] Simcox for further concerns.   Plan  Will not open pharmacy episode at this time.  Harlow Asa, PharmD, [place] Management [phone]

## 2018-08-26 DIAGNOSIS — E7800 Pure hypercholesterolemia, unspecified: Secondary | ICD-10-CM | POA: Diagnosis not present

## 2018-08-26 DIAGNOSIS — E1122 Type 2 diabetes mellitus with diabetic chronic kidney disease: Secondary | ICD-10-CM | POA: Diagnosis not present

## 2018-08-26 DIAGNOSIS — I10 Essential (primary) hypertension: Secondary | ICD-10-CM | POA: Diagnosis not present

## 2018-08-30 DIAGNOSIS — H524 Presbyopia: Secondary | ICD-10-CM | POA: Diagnosis not present

## 2018-08-30 DIAGNOSIS — E11319 Type 2 diabetes mellitus with unspecified diabetic retinopathy without macular edema: Secondary | ICD-10-CM | POA: Diagnosis not present

## 2018-09-02 DIAGNOSIS — E782 Mixed hyperlipidemia: Secondary | ICD-10-CM | POA: Diagnosis not present

## 2018-09-02 DIAGNOSIS — E1129 Type 2 diabetes mellitus with other diabetic kidney complication: Secondary | ICD-10-CM | POA: Diagnosis not present

## 2018-09-02 DIAGNOSIS — E1165 Type 2 diabetes mellitus with hyperglycemia: Secondary | ICD-10-CM | POA: Diagnosis not present

## 2018-09-02 DIAGNOSIS — E1122 Type 2 diabetes mellitus with diabetic chronic kidney disease: Secondary | ICD-10-CM | POA: Diagnosis not present

## 2018-09-02 DIAGNOSIS — E1142 Type 2 diabetes mellitus with diabetic polyneuropathy: Secondary | ICD-10-CM | POA: Diagnosis not present

## 2018-09-07 DIAGNOSIS — I10 Essential (primary) hypertension: Secondary | ICD-10-CM | POA: Diagnosis not present

## 2018-09-07 DIAGNOSIS — E1122 Type 2 diabetes mellitus with diabetic chronic kidney disease: Secondary | ICD-10-CM | POA: Diagnosis not present

## 2018-09-07 DIAGNOSIS — I739 Peripheral vascular disease, unspecified: Secondary | ICD-10-CM | POA: Diagnosis not present

## 2018-09-07 DIAGNOSIS — E1140 Type 2 diabetes mellitus with diabetic neuropathy, unspecified: Secondary | ICD-10-CM | POA: Diagnosis not present

## 2018-09-07 DIAGNOSIS — E782 Mixed hyperlipidemia: Secondary | ICD-10-CM | POA: Diagnosis not present

## 2018-09-26 DIAGNOSIS — I10 Essential (primary) hypertension: Secondary | ICD-10-CM | POA: Diagnosis not present

## 2018-09-26 DIAGNOSIS — E1165 Type 2 diabetes mellitus with hyperglycemia: Secondary | ICD-10-CM | POA: Diagnosis not present

## 2018-09-26 DIAGNOSIS — E7800 Pure hypercholesterolemia, unspecified: Secondary | ICD-10-CM | POA: Diagnosis not present

## 2018-10-26 DIAGNOSIS — I10 Essential (primary) hypertension: Secondary | ICD-10-CM | POA: Diagnosis not present

## 2018-10-26 DIAGNOSIS — E7800 Pure hypercholesterolemia, unspecified: Secondary | ICD-10-CM | POA: Diagnosis not present

## 2018-11-03 DIAGNOSIS — B351 Tinea unguium: Secondary | ICD-10-CM | POA: Diagnosis not present

## 2018-11-03 DIAGNOSIS — E1151 Type 2 diabetes mellitus with diabetic peripheral angiopathy without gangrene: Secondary | ICD-10-CM | POA: Diagnosis not present

## 2018-11-03 DIAGNOSIS — M79672 Pain in left foot: Secondary | ICD-10-CM | POA: Diagnosis not present

## 2018-11-03 DIAGNOSIS — M79671 Pain in right foot: Secondary | ICD-10-CM | POA: Diagnosis not present

## 2018-11-09 DIAGNOSIS — Z23 Encounter for immunization: Secondary | ICD-10-CM | POA: Diagnosis not present

## 2018-11-25 DIAGNOSIS — E1121 Type 2 diabetes mellitus with diabetic nephropathy: Secondary | ICD-10-CM | POA: Diagnosis not present

## 2018-11-25 DIAGNOSIS — I10 Essential (primary) hypertension: Secondary | ICD-10-CM | POA: Diagnosis not present

## 2018-11-25 DIAGNOSIS — E782 Mixed hyperlipidemia: Secondary | ICD-10-CM | POA: Diagnosis not present

## 2018-11-25 DIAGNOSIS — E1140 Type 2 diabetes mellitus with diabetic neuropathy, unspecified: Secondary | ICD-10-CM | POA: Diagnosis not present

## 2018-12-26 DIAGNOSIS — E782 Mixed hyperlipidemia: Secondary | ICD-10-CM | POA: Diagnosis not present

## 2018-12-26 DIAGNOSIS — E1165 Type 2 diabetes mellitus with hyperglycemia: Secondary | ICD-10-CM | POA: Diagnosis not present

## 2018-12-26 DIAGNOSIS — I10 Essential (primary) hypertension: Secondary | ICD-10-CM | POA: Diagnosis not present

## 2019-01-03 DIAGNOSIS — E875 Hyperkalemia: Secondary | ICD-10-CM | POA: Diagnosis not present

## 2019-01-03 DIAGNOSIS — I10 Essential (primary) hypertension: Secondary | ICD-10-CM | POA: Diagnosis not present

## 2019-01-03 DIAGNOSIS — K219 Gastro-esophageal reflux disease without esophagitis: Secondary | ICD-10-CM | POA: Diagnosis not present

## 2019-01-03 DIAGNOSIS — E782 Mixed hyperlipidemia: Secondary | ICD-10-CM | POA: Diagnosis not present

## 2019-01-03 DIAGNOSIS — E1122 Type 2 diabetes mellitus with diabetic chronic kidney disease: Secondary | ICD-10-CM | POA: Diagnosis not present

## 2019-01-06 ENCOUNTER — Other Ambulatory Visit: Payer: Self-pay | Admitting: Pharmacy Technician

## 2019-01-06 DIAGNOSIS — Z1389 Encounter for screening for other disorder: Secondary | ICD-10-CM | POA: Diagnosis not present

## 2019-01-06 DIAGNOSIS — E1129 Type 2 diabetes mellitus with other diabetic kidney complication: Secondary | ICD-10-CM | POA: Diagnosis not present

## 2019-01-06 DIAGNOSIS — I10 Essential (primary) hypertension: Secondary | ICD-10-CM | POA: Diagnosis not present

## 2019-01-06 DIAGNOSIS — I739 Peripheral vascular disease, unspecified: Secondary | ICD-10-CM | POA: Diagnosis not present

## 2019-01-06 DIAGNOSIS — E1122 Type 2 diabetes mellitus with diabetic chronic kidney disease: Secondary | ICD-10-CM | POA: Diagnosis not present

## 2019-01-06 DIAGNOSIS — E1165 Type 2 diabetes mellitus with hyperglycemia: Secondary | ICD-10-CM | POA: Diagnosis not present

## 2019-01-06 NOTE — Patient Outreach (Signed)
[facility]) Care Management  01/06/2019  James Dougherty 23-Aug-1950 [id number]   Successful outreach call placed to patient in regards to a voicemail he left me concerning re enrollment with [employer] for Trulicity in 0122.  Spoke to patient, HIPAA identifiers verified.  Informed patient that a pharmacist would be calling him this upcoming week to discuss 2021 re enrollment with [employer] for Trulicity. Patient verbalized understanding.   Note: A secure correspondence was sent to James Dougherty who responded that he would outreach patient next week.  Alyx Gee P. Janellie Tennison, [place] Management [phone]

## 2019-01-09 ENCOUNTER — Other Ambulatory Visit: Payer: Self-pay | Admitting: Pharmacist

## 2019-01-09 NOTE — Patient Outreach (Signed)
[facility] [facility]) Care Management  01/09/2019  James Dougherty April 14, 1950 [id number]  Received a message from [place], pharmacy technician, patient is requesting a call regarding [place] medication patient assistance enrollment.   Unsuccessful outreach to member, HIPAA compliant message left requesting return call and member returned call.   HIPAA details verified and member agreed to call. Discussed with member Dr Edythe Lynn should have pharmacists working with the office through a third part vendor---member confirmed speaking with a pharmacist named Patty every 3 months.   Member was encouraged to reach out to pharmacist with Dr Edythe Lynn office to see if they are able to assist with his medication patient assistance. If they are not able to, member was provided with my phone number to call back.   Plan:  No further outreach needed at this time.   James Dougherty, PharmD, [facility] [phone]

## 2019-01-26 DIAGNOSIS — I10 Essential (primary) hypertension: Secondary | ICD-10-CM | POA: Diagnosis not present

## 2019-01-26 DIAGNOSIS — E782 Mixed hyperlipidemia: Secondary | ICD-10-CM | POA: Diagnosis not present

## 2019-03-02 DIAGNOSIS — M79671 Pain in right foot: Secondary | ICD-10-CM | POA: Diagnosis not present

## 2019-03-02 DIAGNOSIS — M79672 Pain in left foot: Secondary | ICD-10-CM | POA: Diagnosis not present

## 2019-03-02 DIAGNOSIS — E1151 Type 2 diabetes mellitus with diabetic peripheral angiopathy without gangrene: Secondary | ICD-10-CM | POA: Diagnosis not present

## 2019-03-02 DIAGNOSIS — B351 Tinea unguium: Secondary | ICD-10-CM | POA: Diagnosis not present

## 2019-03-24 DIAGNOSIS — I10 Essential (primary) hypertension: Secondary | ICD-10-CM | POA: Diagnosis not present

## 2019-03-24 DIAGNOSIS — E1140 Type 2 diabetes mellitus with diabetic neuropathy, unspecified: Secondary | ICD-10-CM | POA: Diagnosis not present

## 2019-04-26 DIAGNOSIS — I10 Essential (primary) hypertension: Secondary | ICD-10-CM | POA: Diagnosis not present

## 2019-04-26 DIAGNOSIS — E1140 Type 2 diabetes mellitus with diabetic neuropathy, unspecified: Secondary | ICD-10-CM | POA: Diagnosis not present

## 2019-05-08 DIAGNOSIS — E782 Mixed hyperlipidemia: Secondary | ICD-10-CM | POA: Diagnosis not present

## 2019-05-08 DIAGNOSIS — E1122 Type 2 diabetes mellitus with diabetic chronic kidney disease: Secondary | ICD-10-CM | POA: Diagnosis not present

## 2019-05-08 DIAGNOSIS — E1142 Type 2 diabetes mellitus with diabetic polyneuropathy: Secondary | ICD-10-CM | POA: Diagnosis not present

## 2019-05-08 DIAGNOSIS — E7800 Pure hypercholesterolemia, unspecified: Secondary | ICD-10-CM | POA: Diagnosis not present

## 2019-05-08 DIAGNOSIS — E1165 Type 2 diabetes mellitus with hyperglycemia: Secondary | ICD-10-CM | POA: Diagnosis not present

## 2019-05-10 DIAGNOSIS — M545 Low back pain: Secondary | ICD-10-CM | POA: Diagnosis not present

## 2019-05-10 DIAGNOSIS — I10 Essential (primary) hypertension: Secondary | ICD-10-CM | POA: Diagnosis not present

## 2019-05-10 DIAGNOSIS — E782 Mixed hyperlipidemia: Secondary | ICD-10-CM | POA: Diagnosis not present

## 2019-05-10 DIAGNOSIS — E1140 Type 2 diabetes mellitus with diabetic neuropathy, unspecified: Secondary | ICD-10-CM | POA: Diagnosis not present

## 2019-05-10 DIAGNOSIS — E1165 Type 2 diabetes mellitus with hyperglycemia: Secondary | ICD-10-CM | POA: Diagnosis not present

## 2019-05-10 DIAGNOSIS — E1122 Type 2 diabetes mellitus with diabetic chronic kidney disease: Secondary | ICD-10-CM | POA: Diagnosis not present

## 2019-05-10 DIAGNOSIS — I739 Peripheral vascular disease, unspecified: Secondary | ICD-10-CM | POA: Diagnosis not present

## 2019-05-12 DIAGNOSIS — Z23 Encounter for immunization: Secondary | ICD-10-CM | POA: Diagnosis not present

## 2019-06-01 DIAGNOSIS — B351 Tinea unguium: Secondary | ICD-10-CM | POA: Diagnosis not present

## 2019-06-01 DIAGNOSIS — E1151 Type 2 diabetes mellitus with diabetic peripheral angiopathy without gangrene: Secondary | ICD-10-CM | POA: Diagnosis not present

## 2019-06-01 DIAGNOSIS — M79671 Pain in right foot: Secondary | ICD-10-CM | POA: Diagnosis not present

## 2019-06-01 DIAGNOSIS — M79672 Pain in left foot: Secondary | ICD-10-CM | POA: Diagnosis not present

## 2019-06-21 DIAGNOSIS — Z23 Encounter for immunization: Secondary | ICD-10-CM | POA: Diagnosis not present

## 2019-06-26 DIAGNOSIS — N183 Chronic kidney disease, stage 3 unspecified: Secondary | ICD-10-CM | POA: Diagnosis not present

## 2019-06-26 DIAGNOSIS — E7849 Other hyperlipidemia: Secondary | ICD-10-CM | POA: Diagnosis not present

## 2019-06-26 DIAGNOSIS — I129 Hypertensive chronic kidney disease with stage 1 through stage 4 chronic kidney disease, or unspecified chronic kidney disease: Secondary | ICD-10-CM | POA: Diagnosis not present

## 2019-06-26 DIAGNOSIS — E1122 Type 2 diabetes mellitus with diabetic chronic kidney disease: Secondary | ICD-10-CM | POA: Diagnosis not present

## 2019-07-26 DIAGNOSIS — I129 Hypertensive chronic kidney disease with stage 1 through stage 4 chronic kidney disease, or unspecified chronic kidney disease: Secondary | ICD-10-CM | POA: Diagnosis not present

## 2019-07-26 DIAGNOSIS — E1122 Type 2 diabetes mellitus with diabetic chronic kidney disease: Secondary | ICD-10-CM | POA: Diagnosis not present

## 2019-07-26 DIAGNOSIS — N183 Chronic kidney disease, stage 3 unspecified: Secondary | ICD-10-CM | POA: Diagnosis not present

## 2019-07-26 DIAGNOSIS — E7849 Other hyperlipidemia: Secondary | ICD-10-CM | POA: Diagnosis not present

## 2019-09-04 DIAGNOSIS — E1140 Type 2 diabetes mellitus with diabetic neuropathy, unspecified: Secondary | ICD-10-CM | POA: Diagnosis not present

## 2019-09-04 DIAGNOSIS — E782 Mixed hyperlipidemia: Secondary | ICD-10-CM | POA: Diagnosis not present

## 2019-09-04 DIAGNOSIS — E1122 Type 2 diabetes mellitus with diabetic chronic kidney disease: Secondary | ICD-10-CM | POA: Diagnosis not present

## 2019-09-04 DIAGNOSIS — E7800 Pure hypercholesterolemia, unspecified: Secondary | ICD-10-CM | POA: Diagnosis not present

## 2019-09-04 DIAGNOSIS — E1165 Type 2 diabetes mellitus with hyperglycemia: Secondary | ICD-10-CM | POA: Diagnosis not present

## 2019-09-07 DIAGNOSIS — E1140 Type 2 diabetes mellitus with diabetic neuropathy, unspecified: Secondary | ICD-10-CM | POA: Diagnosis not present

## 2019-09-07 DIAGNOSIS — E1122 Type 2 diabetes mellitus with diabetic chronic kidney disease: Secondary | ICD-10-CM | POA: Diagnosis not present

## 2019-09-07 DIAGNOSIS — E1165 Type 2 diabetes mellitus with hyperglycemia: Secondary | ICD-10-CM | POA: Diagnosis not present

## 2019-09-07 DIAGNOSIS — I10 Essential (primary) hypertension: Secondary | ICD-10-CM | POA: Diagnosis not present

## 2019-09-07 DIAGNOSIS — E782 Mixed hyperlipidemia: Secondary | ICD-10-CM | POA: Diagnosis not present

## 2019-09-14 DIAGNOSIS — B351 Tinea unguium: Secondary | ICD-10-CM | POA: Diagnosis not present

## 2019-09-14 DIAGNOSIS — M79672 Pain in left foot: Secondary | ICD-10-CM | POA: Diagnosis not present

## 2019-09-14 DIAGNOSIS — M79671 Pain in right foot: Secondary | ICD-10-CM | POA: Diagnosis not present

## 2019-09-14 DIAGNOSIS — E1151 Type 2 diabetes mellitus with diabetic peripheral angiopathy without gangrene: Secondary | ICD-10-CM | POA: Diagnosis not present

## 2019-10-20 DIAGNOSIS — Z23 Encounter for immunization: Secondary | ICD-10-CM | POA: Diagnosis not present

## 2019-10-26 DIAGNOSIS — E7849 Other hyperlipidemia: Secondary | ICD-10-CM | POA: Diagnosis not present

## 2019-10-26 DIAGNOSIS — I129 Hypertensive chronic kidney disease with stage 1 through stage 4 chronic kidney disease, or unspecified chronic kidney disease: Secondary | ICD-10-CM | POA: Diagnosis not present

## 2019-10-26 DIAGNOSIS — N183 Chronic kidney disease, stage 3 unspecified: Secondary | ICD-10-CM | POA: Diagnosis not present

## 2019-10-26 DIAGNOSIS — E1122 Type 2 diabetes mellitus with diabetic chronic kidney disease: Secondary | ICD-10-CM | POA: Diagnosis not present

## 2019-12-07 DIAGNOSIS — M79671 Pain in right foot: Secondary | ICD-10-CM | POA: Diagnosis not present

## 2019-12-07 DIAGNOSIS — E1151 Type 2 diabetes mellitus with diabetic peripheral angiopathy without gangrene: Secondary | ICD-10-CM | POA: Diagnosis not present

## 2019-12-07 DIAGNOSIS — B351 Tinea unguium: Secondary | ICD-10-CM | POA: Diagnosis not present

## 2019-12-07 DIAGNOSIS — M79672 Pain in left foot: Secondary | ICD-10-CM | POA: Diagnosis not present

## 2019-12-26 DIAGNOSIS — I129 Hypertensive chronic kidney disease with stage 1 through stage 4 chronic kidney disease, or unspecified chronic kidney disease: Secondary | ICD-10-CM | POA: Diagnosis not present

## 2019-12-26 DIAGNOSIS — E7849 Other hyperlipidemia: Secondary | ICD-10-CM | POA: Diagnosis not present

## 2019-12-26 DIAGNOSIS — N183 Chronic kidney disease, stage 3 unspecified: Secondary | ICD-10-CM | POA: Diagnosis not present

## 2019-12-26 DIAGNOSIS — E1122 Type 2 diabetes mellitus with diabetic chronic kidney disease: Secondary | ICD-10-CM | POA: Diagnosis not present

## 2020-01-01 DIAGNOSIS — E1165 Type 2 diabetes mellitus with hyperglycemia: Secondary | ICD-10-CM | POA: Diagnosis not present

## 2020-01-01 DIAGNOSIS — K2100 Gastro-esophageal reflux disease with esophagitis, without bleeding: Secondary | ICD-10-CM | POA: Diagnosis not present

## 2020-01-01 DIAGNOSIS — E1122 Type 2 diabetes mellitus with diabetic chronic kidney disease: Secondary | ICD-10-CM | POA: Diagnosis not present

## 2020-01-01 DIAGNOSIS — E7800 Pure hypercholesterolemia, unspecified: Secondary | ICD-10-CM | POA: Diagnosis not present

## 2020-01-01 DIAGNOSIS — E782 Mixed hyperlipidemia: Secondary | ICD-10-CM | POA: Diagnosis not present

## 2020-01-04 DIAGNOSIS — E1122 Type 2 diabetes mellitus with diabetic chronic kidney disease: Secondary | ICD-10-CM | POA: Diagnosis not present

## 2020-01-04 DIAGNOSIS — E1165 Type 2 diabetes mellitus with hyperglycemia: Secondary | ICD-10-CM | POA: Diagnosis not present

## 2020-01-04 DIAGNOSIS — E782 Mixed hyperlipidemia: Secondary | ICD-10-CM | POA: Diagnosis not present

## 2020-01-04 DIAGNOSIS — E1140 Type 2 diabetes mellitus with diabetic neuropathy, unspecified: Secondary | ICD-10-CM | POA: Diagnosis not present

## 2020-01-04 DIAGNOSIS — I10 Essential (primary) hypertension: Secondary | ICD-10-CM | POA: Diagnosis not present

## 2020-01-26 DIAGNOSIS — E7849 Other hyperlipidemia: Secondary | ICD-10-CM | POA: Diagnosis not present

## 2020-01-26 DIAGNOSIS — N183 Chronic kidney disease, stage 3 unspecified: Secondary | ICD-10-CM | POA: Diagnosis not present

## 2020-01-26 DIAGNOSIS — E1122 Type 2 diabetes mellitus with diabetic chronic kidney disease: Secondary | ICD-10-CM | POA: Diagnosis not present

## 2020-01-26 DIAGNOSIS — I129 Hypertensive chronic kidney disease with stage 1 through stage 4 chronic kidney disease, or unspecified chronic kidney disease: Secondary | ICD-10-CM | POA: Diagnosis not present

## 2020-02-24 DIAGNOSIS — N183 Chronic kidney disease, stage 3 unspecified: Secondary | ICD-10-CM | POA: Diagnosis not present

## 2020-02-24 DIAGNOSIS — E1122 Type 2 diabetes mellitus with diabetic chronic kidney disease: Secondary | ICD-10-CM | POA: Diagnosis not present

## 2020-02-24 DIAGNOSIS — E7849 Other hyperlipidemia: Secondary | ICD-10-CM | POA: Diagnosis not present

## 2020-02-24 DIAGNOSIS — I129 Hypertensive chronic kidney disease with stage 1 through stage 4 chronic kidney disease, or unspecified chronic kidney disease: Secondary | ICD-10-CM | POA: Diagnosis not present

## 2021-01-23 ENCOUNTER — Other Ambulatory Visit: Payer: Self-pay | Admitting: *Deleted

## 2021-01-23 DIAGNOSIS — I739 Peripheral vascular disease, unspecified: Secondary | ICD-10-CM

## 2021-01-24 ENCOUNTER — Encounter (HOSPITAL_COMMUNITY): Payer: Medicare Other

## 2021-01-24 ENCOUNTER — Ambulatory Visit: Payer: Medicare Other

## 2021-02-06 ENCOUNTER — Other Ambulatory Visit: Payer: Self-pay | Admitting: Vascular Surgery

## 2021-02-06 ENCOUNTER — Other Ambulatory Visit: Payer: Self-pay

## 2021-02-06 ENCOUNTER — Ambulatory Visit: Payer: Medicare Other | Admitting: Physician Assistant

## 2021-02-06 ENCOUNTER — Ambulatory Visit (INDEPENDENT_AMBULATORY_CARE_PROVIDER_SITE_OTHER)
Admission: RE | Admit: 2021-02-06 | Discharge: 2021-02-06 | Disposition: A | Discharge status: Home / Self Care | Payer: Medicare Other | Source: Ambulatory Visit | Attending: Vascular Surgery | Admitting: Vascular Surgery

## 2021-02-06 ENCOUNTER — Ambulatory Visit (HOSPITAL_COMMUNITY)
Admission: RE | Admit: 2021-02-06 | Discharge: 2021-02-06 | Disposition: A | Discharge status: Home / Self Care | Payer: Medicare Other | Source: Ambulatory Visit | Attending: Vascular Surgery | Admitting: Vascular Surgery

## 2021-02-06 VITALS — BP 150/80 | HR 101 | Temp 98.6°F | Resp 20 | Ht 68.0 in | Wt 248.3 lb

## 2021-02-06 DIAGNOSIS — I6529 Occlusion and stenosis of unspecified carotid artery: Secondary | ICD-10-CM

## 2021-02-06 DIAGNOSIS — I6521 Occlusion and stenosis of right carotid artery: Secondary | ICD-10-CM

## 2021-02-06 DIAGNOSIS — I739 Peripheral vascular disease, unspecified: Secondary | ICD-10-CM | POA: Insufficient documentation

## 2021-02-06 DIAGNOSIS — I6523 Occlusion and stenosis of bilateral carotid arteries: Secondary | ICD-10-CM | POA: Diagnosis present

## 2021-02-06 NOTE — Progress Notes (Signed)
[facility] [place]   History of Present Illness:  Patient is a 71 y.o. year old male  was first seen in 2014 for  chronic left lower extremity wound. Ultrasound revealed an occluded superficial femoral artery. S/P stent, left superficial femoral and popliteal artery by Dr. Trula Slade.   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 Donnetta Hutching on 04/26/17.  Prior to amputation patient had right external iliac artery stenting as well as right SFA atherectomy and stenting by Dr. Oneida Alar in March 2019.  Following arteriogram with revascularization patient had in line flow to right foot via posterior tibial and peroneal artery.  Balloon angioplasty, right superficial femoral artery.    04/19/2018 he underwent angiography after  ultrasound identified a high-grade lesion within the right superficial femoral artery.  Intervention was Balloon angioplasty, right superficial femoral artery.  He states he is unable to walk as far as he used to.  He can however ride an elliptical for 50 minutes. He did fall recently in his drive way and scrapped his left anterior shin which is slow to heal.  He does have claudication with shorter distances over the last year, no rest pain.    He has history of asymptomatic Carotid stenosis.  He denies amaurosis, weakness, and aphasia.  He was last seen 2 years ago right ICA 60-79, left 40-59%.[name]  He is here today for surveillance.    He is on a Statin, Plavix and ASA daily for maximum medical management.    Past Medical History:  Diagnosis Date   Arthritis    "hands" (12/27/2012)   High cholesterol    Hypertension    Neuropathic pain    PAD (peripheral artery disease) ([place])    Psoriasis    Stroke ([place]) 1999   "still have some speech problems at times; sometimes forget what I was going to say" (12/27/2012)   Type II diabetes mellitus ([place])    Ulcer    Left ankle/leg    Past Surgical History:  Procedure Laterality Date    ABDOMINAL AORTAGRAM N/A 03/30/2012   Procedure: ABDOMINAL Maxcine Ham;  Surgeon: Serafina Mitchell, MD;  Location: [facility] CATH LAB;  Service: Cardiovascular;  Laterality: N/A;   ABDOMINAL AORTAGRAM N/A 12/27/2012   Procedure: ABDOMINAL Maxcine Ham;  Surgeon: Serafina Mitchell, MD;  Location: [facility] CATH LAB;  Service: Cardiovascular;  Laterality: N/A;   ABDOMINAL AORTOGRAM W/LOWER EXTREMITY N/A 04/05/2017   Procedure: ABDOMINAL AORTOGRAM W/LOWER EXTREMITY;  Surgeon: Elam Dutch, MD;  Location: [place] CV LAB;  Service: Cardiovascular;  Laterality: N/A;   AMPUTATION Right 04/26/2017   Procedure: AMPUTATION TRANSMETATARSAL RIGHT GREAT TOE;  Surgeon: Rosetta Posner, MD;  Location: [facility] OR;  Service: Vascular;  Laterality: Right;   ANGIOPLASTY / STENTING FEMORAL Left [id number]   APPLICATION OF WOUND VAC Right 04/26/2017   Procedure: APPLICATION OF WOUND VAC;  Surgeon: Rosetta Posner, MD;  Location: [facility];  Service: Vascular;  Laterality: Right;   FEMORAL ARTERY STENT  03/30/2012   LOWER EXTREMITY ANGIOGRAM  12/27/2012   Procedure: LOWER EXTREMITY ANGIOGRAM;  Surgeon: Serafina Mitchell, MD;  Location: [facility] CATH LAB;  Service: Cardiovascular;;   LOWER EXTREMITY ANGIOGRAPHY N/A 04/19/2018   Procedure: LOWER EXTREMITY ANGIOGRAPHY;  Surgeon: Serafina Mitchell, MD;  Location: [place] CV LAB;  Service: Cardiovascular;  Laterality: N/A;   PERIPHERAL VASCULAR ATHERECTOMY Right 04/05/2017   Procedure: PERIPHERAL VASCULAR ATHERECTOMY;  Surgeon: Elam Dutch, MD;  Location: [place] CV LAB;  Service: Cardiovascular;  Laterality: Right;  superficial femoral   PERIPHERAL VASCULAR BALLOON ANGIOPLASTY  04/19/2018   Procedure: PERIPHERAL VASCULAR BALLOON ANGIOPLASTY;  Surgeon: Serafina Mitchell, MD;  Location: [place] CV LAB;  Service: Cardiovascular;;   PERIPHERAL VASCULAR INTERVENTION Right 04/05/2017   Procedure: PERIPHERAL VASCULAR INTERVENTION;  Surgeon: Elam Dutch, MD;  Location: [place] CV LAB;  Service:  Cardiovascular;  Laterality: Right;   Superficial femorl and external iliac   POPLITEAL ARTERY STENT     TONSILLECTOMY AND ADENOIDECTOMY      ROS:   General:  No weight loss, Fever, chills  HEENT: No recent headaches, no nasal bleeding, no visual changes, no sore throat  Neurologic: No dizziness, blackouts, seizures. No recent symptoms of stroke or mini- stroke. No recent episodes of slurred speech, or temporary blindness.  Cardiac: No recent episodes of chest pain/pressure, no shortness of breath at rest.  No shortness of breath with exertion.  Denies history of atrial fibrillation or irregular heartbeat  Vascular: No history of rest pain in feet.  No history of claudication.  No history of non-healing ulcer, No history of DVT   Pulmonary: No home oxygen, no productive cough, no hemoptysis,  No asthma or wheezing  Musculoskeletal:  [ ]  Arthritis, [ ]  Low back pain,  [ ]  Joint pain  Hematologic:No history of hypercoagulable state.  No history of easy bleeding.  No history of anemia  Gastrointestinal: No hematochezia or melena,  No gastroesophageal reflux, no trouble swallowing  Urinary: [ ]  chronic Kidney disease, [ ]  on HD - [ ]  MWF or [ ]  TTHS, [ ]  Burning with urination, [ ]  Frequent urination, [ ]  Difficulty urinating;   Skin: No rashes  Psychological: No history of anxiety,  No history of depression  Social History Social History   Tobacco Use   Smoking status: Former    Packs/day: 3.00    Years: 45.00    Pack years: 135.00    Types: Cigarettes    Quit date: 01/07/2011    Years since quitting: 10.0   Smokeless tobacco: Never  Vaping Use   Vaping Use: Never used  Substance Use Topics   Alcohol use: Not Currently    Comment: 12/27/2012 "couple times/yr I might have a drink"   Drug use: No    Family History Family History  Problem Relation Age of Onset   Diabetes Sister        Bilateral amputation of lower legs   Diabetes Sister    Heart disease Sister 64        Heart disease before age 45   Hypertension Sister    Heart attack Sister    Hyperlipidemia Sister    Hypertension Father    Hyperlipidemia Father    Hyperlipidemia Mother    Hypertension Mother    Hypertension Daughter     Allergies  Allergies  Allergen Reactions   Oxycodone Nausea And Vomiting   Glipizide     Bad headache and blurred vision   Bactrim [Sulfamethoxazole-Trimethoprim] Itching and Rash     Current Outpatient Medications  Medication Sig Dispense Refill   aspirin EC 81 MG tablet Take 81 mg by mouth daily.     clopidogrel (PLAVIX) 75 MG tablet Take 1 tablet (75 mg total) by mouth daily with breakfast. 30 tablet 0   dextromethorphan (DELSYM) 30 MG/5ML liquid Take 30-60 mg by mouth daily as needed for cough.     Dulaglutide 0.75 MG/0.5ML SOPN  Inject 0.75 mg into the skin every Wednesday. Wednesdays      gabapentin (NEURONTIN) 600 MG tablet Take 600 mg by mouth at bedtime.      hydrochlorothiazide (HYDRODIURIL) 12.5 MG tablet Take 12.5 mg by mouth daily.     losartan (COZAAR) 25 MG tablet Take 25 mg by mouth daily.     NOVOLIN 70/30 RELION (70-30) 100 UNIT/ML injection Inject 14 Units into the skin 2 (two) times daily with a meal.   12   Podiatric Products (GOLD BOND FOOT) CREA Apply 1 application topically 2 (two) times daily as needed (DRY SKIN). DIABETIC CREAM     simvastatin (ZOCOR) 20 MG tablet Take 20 mg by mouth at bedtime.      amLODipine (NORVASC) 5 MG tablet Take 1 tablet (5 mg total) by mouth daily. (Patient not taking: Reported on 02/06/2021) 30 tablet 0   lisinopril-hydrochlorothiazide (PRINZIDE,ZESTORETIC) 20-25 MG tablet Take 1 tablet by mouth daily. (Patient not taking: Reported on 02/06/2021)     No current facility-administered medications for this visit.    Physical Examination  Vitals:   02/06/21 1417  BP: (!) 150/80  Pulse: (!) 101  Resp: 20  Temp: 98.6 F (37 C)  TempSrc: Temporal  SpO2: 97%  Weight: 248 lb 4.8 oz (112.6 kg)  Height:  5\' 8"  (1.727 m)    Body mass index is 37.75 kg/m.  General:  Alert and oriented, no acute distress HEENT: Normal Neck: No bruit or JVD Pulmonary: Clear to auscultation bilaterally Cardiac: Regular Rate and Rhythm without murmur Abdomen: Soft, non-tender, non-distended, no mass, no scars Skin: No rash    Musculoskeletal: No deformity or edema  Neurologic: Upper and lower extremity motor 5/5 and symmetric  DATA:      ABI Findings:  +---------+------------------+-----+----------+----------+   Right     Rt Pressure (mmHg) Index Waveform   Comment      +---------+------------------+-----+----------+----------+   Brachial  158                                              +---------+------------------+-----+----------+----------+   PTA       111                0.70  monophasic              +---------+------------------+-----+----------+----------+   DP        98                 0.62  monophasic              +---------+------------------+-----+----------+----------+   Great Toe                                     amputation   +---------+------------------+-----+----------+----------+   +---------+------------------+-----+-------------------+-------+   Left      Lt Pressure (mmHg) Index Waveform            Comment   +---------+------------------+-----+-------------------+-------+   Brachial  151                                                    +---------+------------------+-----+-------------------+-------+   PTA  81                 0.51  dampened monophasic           +---------+------------------+-----+-------------------+-------+   DP        114                0.72  monophasic                    +---------+------------------+-----+-------------------+-------+   Great Toe 23                 0.15                                +---------+------------------+-----+-------------------+-------+   +-------+-----------+-----------+------------+------------+   ABI/TBI Today's  ABI Today's TBI Previous ABI Previous TBI   +-------+-----------+-----------+------------+------------+   Right   0.70        amputation  0.53         amputation     +-------+-----------+-----------+------------+------------+   Left    0.72        0.15        0.80         0.30           +-------+-----------+-----------+------------+------------+    Right ABIs appear increased compared to prior study on 04/11/2018. Left  ABIs appear essentially unchanged compared to prior study on 04/11/2018.     Summary:  Right: Resting right ankle-brachial index indicates moderate right lower  extremity arterial disease.   Left: Resting left ankle-brachial index indicates moderate left lower  extremity arterial disease. The left toe-brachial index is abnormal.      Right Carotid Findings:  +----------+--------+--------+--------+--------------------------+--------+               PSV cm/s EDV cm/s Stenosis Plaque Description           Comments   +----------+--------+--------+--------+--------------------------+--------+    CCA Prox   118      27                                                        +----------+--------+--------+--------+--------------------------+--------+    CCA Mid    107      24                                                        +----------+--------+--------+--------+--------------------------+--------+    CCA Distal 161      31                irregular and heterogenous              +----------+--------+--------+--------+--------------------------+--------+    ICA Prox   499      143      80-99%   calcific and irregular                  +----------+--------+--------+--------+--------------------------+--------+    ICA Mid    200      32                                                        +----------+--------+--------+--------+--------------------------+--------+  ICA Distal 132      28                                                         +----------+--------+--------+--------+--------------------------+--------+    ECA        322      23       >50%                                             +----------+--------+--------+--------+--------------------------+--------+    +----------+--------+-------+----------------+-------------------+              PSV cm/s EDV cms Describe         Arm Pressure (mmHG)   +----------+--------+-------+----------------+-------------------+   Subclavian 160      0       Multiphasic, WNL                       +----------+--------+-------+----------------+-------------------+   +---------+--------+--+--------+--+---------+   Vertebral PSV cm/s 42 EDV cm/s 11 Antegrade   +---------+--------+--+--------+--+---------+       Left Carotid Findings:  +----------+--------+--------+--------+--------------------------+--------+               PSV cm/s EDV cm/s Stenosis Plaque Description           Comments   +----------+--------+--------+--------+--------------------------+--------+    CCA Prox   137      24                                                        +----------+--------+--------+--------+--------------------------+--------+    CCA Mid    130      32                                                        +----------+--------+--------+--------+--------------------------+--------+    CCA Distal 160      30                irregular                               +----------+--------+--------+--------+--------------------------+--------+    ICA Prox   215      58       40-59%   irregular and heterogenous              +----------+--------+--------+--------+--------------------------+--------+    ICA Mid    178      55       40-59%   heterogenous and irregular              +----------+--------+--------+--------+--------------------------+--------+    ICA Distal 93       25                                                         +----------+--------+--------+--------+--------------------------+--------+  ECA        189      25                heterogenous and irregular              +----------+--------+--------+--------+--------------------------+--------+    +----------+--------+--------+----------------+-------------------+              PSV cm/s EDV cm/s Describe         Arm Pressure (mmHG)   +----------+--------+--------+----------------+-------------------+   Subclavian 152      0        Multiphasic, WNL                       +----------+--------+--------+----------------+-------------------+   +---------+--------+--+--------+-+---------+   Vertebral PSV cm/s 23 EDV cm/s 6 Antegrade   +---------+--------+--+--------+-+---------+       Summary:  Right Carotid: Velocities in the right ICA are consistent with a 80-99%                 stenosis. The ECA appears >50% stenosed.   Left Carotid: Velocities in the left ICA are consistent with a 40-59%  stenosis.   Vertebrals:  Bilateral vertebral arteries demonstrate antegrade flow.  Subclavians: Normal flow hemodynamics were seen in bilateral subclavian               arteries.   ASSESSMENT/PLAN:  Asymptomatic Carotid stenosis with duplex showing right ICA  > 80 % today.  EDS of 143.  The left ICA is 40-59%. I will order CTA of the neck and he will f/u with Dr. Virl Cagey for review and possible surgical CEA verse TCAR.  PAD with history of multiple interventions B LE He fell and hit his left shin on pavement.  He has used antibiotic ointment daily.  His ABIs  Right ABIs appear increased compared to prior study on 04/11/2018. Left  ABIs appear essentially unchanged compared to prior study on 04/11/2018. My only concern is the slow healing anterior wound.  I will order arterial duplex studies and have him follow up to make sure his stents are patent B.  Continue activity as tolerates and keep the wound clean dry and intact.           Roxy Horseman PA-C [facility] [place] Office: [phone]  MD in clinic [place]

## 2021-02-07 ENCOUNTER — Telehealth: Payer: Self-pay

## 2021-02-07 NOTE — Telephone Encounter (Addendum)
-----   Message from Lorenza Cambridge sent at 02/07/2021 11:25 AM EST -----  Regarding: RE: CTA, [place] 02/07/21 - left msg on vm  ----- Message ----- From: Yolonda Kida, LPN Sent: 4/68/0321   4:34 PM EST  To: April H Pait, Servando Salina, # Subject: CTA, [place]                                        Please call pt to schedule U/S and CT Neck at [facility]. Thanks

## 2021-03-04 NOTE — Telephone Encounter (Signed)
Opened in error

## 2021-03-19 ENCOUNTER — Ambulatory Visit (HOSPITAL_COMMUNITY)
Admission: RE | Admit: 2021-03-19 | Discharge: 2021-03-19 | Disposition: A | Discharge status: Home / Self Care | Payer: Medicare Other | Source: Ambulatory Visit | Attending: Vascular Surgery | Admitting: Vascular Surgery

## 2021-03-19 ENCOUNTER — Other Ambulatory Visit: Payer: Self-pay

## 2021-03-19 DIAGNOSIS — I6529 Occlusion and stenosis of unspecified carotid artery: Secondary | ICD-10-CM

## 2021-03-19 DIAGNOSIS — I739 Peripheral vascular disease, unspecified: Secondary | ICD-10-CM | POA: Insufficient documentation

## 2021-03-19 LAB — POCT I-STAT CREATININE: Creatinine, Ser: 1.7 mg/dL — ABNORMAL HIGH (ref 0.61–1.24)

## 2021-03-19 MED ORDER — IOHEXOL 350 MG/ML SOLN
75.0000 mL | Freq: Once | INTRAVENOUS | Status: AC | PRN
  Administered 2021-03-19: 60 mL via INTRAVENOUS

## 2021-03-28 ENCOUNTER — Ambulatory Visit: Payer: Medicare Other | Admitting: Vascular Surgery

## 2021-04-18 ENCOUNTER — Ambulatory Visit: Payer: Medicare Other | Admitting: Vascular Surgery

## 2021-04-18 ENCOUNTER — Encounter: Payer: Self-pay | Admitting: Vascular Surgery

## 2021-04-18 ENCOUNTER — Other Ambulatory Visit: Payer: Self-pay

## 2021-04-18 VITALS — BP 133/66 | HR 98 | Temp 98.2°F | Resp 20 | Ht 68.0 in | Wt 247.0 lb

## 2021-04-18 DIAGNOSIS — I70219 Atherosclerosis of native arteries of extremities with intermittent claudication, unspecified extremity: Secondary | ICD-10-CM

## 2021-04-18 DIAGNOSIS — Z95828 Presence of other vascular implants and grafts: Secondary | ICD-10-CM | POA: Diagnosis not present

## 2021-04-18 DIAGNOSIS — I6521 Occlusion and stenosis of right carotid artery: Secondary | ICD-10-CM

## 2021-04-18 NOTE — Progress Notes (Signed)
[facility] [place] ? ? ?Patient is a 71 y.o. year old male with known peripheral arterial disease who is undergone multiple endovascular interventions.  He is also been followed for bilateral carotid artery stenosis.   ? ?Left SFA-pop stenting by Dr. Trula Slade ?Right great toe ray amputation by Dr. Donnetta Hutching ?Right SFA atherectomy and stenting by Dr. Oneida Alar ? ?James Dougherty presents today after bilateral carotid duplex ultrasound demonstrated greater than 80% stenosis of the right internal carotid artery.  He had a follow-up CT a demonstrating roughly 80% stenosis. ?On exam, James Dougherty denies TIA, stroke, amaurosis.  He has had a right-sided stroke in the past, 1999.  Medications include aspirin, Plavix, statin. ? ?James Dougherty is a native of [place], and is going to the [facility] 3-4 times a week.  He exercises on the elliptical.  Since last seen, his lower extremity claudication has not worsened.  He denies rest pain, tissue loss in the lower extremities. ? ?He is on a Statin, Plavix and ASA daily for maximum medical management.   ? ?Past Medical History:  ?Diagnosis Date  ? Arthritis   ? "hands" (12/27/2012)  ? High cholesterol   ? Hypertension   ? Neuropathic pain   ? PAD (peripheral artery disease) ([place])   ? Psoriasis   ? Stroke [facility]) 1999  ? "still have some speech problems at times; sometimes forget what I was going to say" (12/27/2012)  ? Type II diabetes mellitus ([place])   ? Ulcer   ? Left ankle/leg  ? ? ?Past Surgical History:  ?Procedure Laterality Date  ? ABDOMINAL AORTAGRAM N/A 03/30/2012  ? Procedure: ABDOMINAL AORTAGRAM;  Surgeon: Serafina Mitchell, MD;  Location: [facility] CATH LAB;  Service: Cardiovascular;  Laterality: N/A;  ? ABDOMINAL AORTAGRAM N/A 12/27/2012  ? Procedure: ABDOMINAL AORTAGRAM;  Surgeon: Serafina Mitchell, MD;  Location: [facility] CATH LAB;  Service: Cardiovascular;  Laterality: N/A;  ? ABDOMINAL AORTOGRAM W/LOWER EXTREMITY N/A 04/05/2017  ? Procedure: ABDOMINAL AORTOGRAM W/LOWER EXTREMITY;  Surgeon: Elam Dutch, MD;  Location: [place] CV LAB;  Service: Cardiovascular;  Laterality: N/A;  ? AMPUTATION Right 04/26/2017  ? Procedure: AMPUTATION TRANSMETATARSAL RIGHT GREAT TOE;  Surgeon: Rosetta Posner, MD;  Location: [facility] OR;  Service: Vascular;  Laterality: Right;  ? ANGIOPLASTY / STENTING FEMORAL Left 12/27/2012  ? APPLICATION OF WOUND VAC Right 04/26/2017  ? Procedure: APPLICATION OF WOUND VAC;  Surgeon: Rosetta Posner, MD;  Location: [place];  Service: Vascular;  Laterality: Right;  ? FEMORAL ARTERY STENT  03/30/2012  ? LOWER EXTREMITY ANGIOGRAM  12/27/2012  ? Procedure: LOWER EXTREMITY ANGIOGRAM;  Surgeon: Serafina Mitchell, MD;  Location: [facility] CATH LAB;  Service: Cardiovascular;;  ? LOWER EXTREMITY ANGIOGRAPHY N/A 04/19/2018  ? Procedure: LOWER EXTREMITY ANGIOGRAPHY;  Surgeon: Serafina Mitchell, MD;  Location: [place] CV LAB;  Service: Cardiovascular;  Laterality: N/A;  ? PERIPHERAL VASCULAR ATHERECTOMY Right 04/05/2017  ? Procedure: PERIPHERAL VASCULAR ATHERECTOMY;  Surgeon: Elam Dutch, MD;  Location: [place] CV LAB;  Service: Cardiovascular;  Laterality: Right;  superficial femoral  ? PERIPHERAL VASCULAR BALLOON ANGIOPLASTY  04/19/2018  ? Procedure: PERIPHERAL VASCULAR BALLOON ANGIOPLASTY;  Surgeon: Serafina Mitchell, MD;  Location: [place] CV LAB;  Service: Cardiovascular;;  ? PERIPHERAL VASCULAR INTERVENTION Right 04/05/2017  ? Procedure: PERIPHERAL VASCULAR INTERVENTION;  Surgeon: Elam Dutch, MD;  Location: [place] CV LAB;  Service: Cardiovascular;  Laterality: Right;   Superficial femorl and external iliac  ? POPLITEAL ARTERY STENT    ?  TONSILLECTOMY AND ADENOIDECTOMY    ? ? ?ROS:  ? ?General:  No weight loss, Fever, chills ? ?HEENT: No recent headaches, no nasal bleeding, no visual changes, no sore throat ? ?Neurologic: No dizziness, blackouts, seizures. No recent symptoms of stroke or mini- stroke. No recent episodes of slurred speech, or temporary blindness. ? ?Cardiac: No recent episodes of chest  pain/pressure, no shortness of breath at rest.  No shortness of breath with exertion.  Denies history of atrial fibrillation or irregular heartbeat ? ?Vascular: Nonpalpable pulses in the feet, mixed arterial venous disease appreciated with some hemosiderin staining. ? ?Pulmonary: No home oxygen, no productive cough, no hemoptysis,  No asthma or wheezing ? ?Musculoskeletal:  [ ]  Arthritis, [ ]  Low back pain,  [ ]  Joint pain ? ?Hematologic:No history of hypercoagulable state.  No history of easy bleeding.  No history of anemia ? ?Gastrointestinal: No hematochezia or melena,  No gastroesophageal reflux, no trouble swallowing ? ?Urinary: [ ]  chronic Kidney disease, [ ]  on HD - [ ]  MWF or [ ]  TTHS, [ ]  Burning with urination, [ ]  Frequent urination, [ ]  Difficulty urinating;  ? ?Skin: No rashes ? ?Psychological: No history of anxiety,  No history of depression ? ?Social History ?Social History  ? ?Tobacco Use  ? Smoking status: Former  ?  Packs/day: 3.00  ?  Years: 45.00  ?  Pack years: 135.00  ?  Types: Cigarettes  ?  Quit date: 01/07/2011  ?  Years since quitting: 10.2  ? Smokeless tobacco: Never  ?Vaping Use  ? Vaping Use: Never used  ?Substance Use Topics  ? Alcohol use: Not Currently  ?  Comment: 12/27/2012 "couple times/yr I might have a drink"  ? Drug use: No  ? ? ?Family History ?Family History  ?Problem Relation Age of Onset  ? Diabetes Sister   ?     Bilateral amputation of lower legs  ? Diabetes Sister   ? Heart disease Sister 16  ?     Heart disease before age 71  ? Hypertension Sister   ? Heart attack Sister   ? Hyperlipidemia Sister   ? Hypertension Father   ? Hyperlipidemia Father   ? Hyperlipidemia Mother   ? Hypertension Mother   ? Hypertension Daughter   ? ? ?Allergies ? ?Allergies  ?Allergen Reactions  ? Oxycodone Nausea And Vomiting  ? Glipizide   ?  Bad headache and blurred vision  ? Bactrim [Sulfamethoxazole-Trimethoprim] Itching and Rash  ? ? ? ?Current Outpatient Medications  ?Medication Sig  Dispense Refill  ? aspirin EC 81 MG tablet Take 81 mg by mouth daily.    ? clopidogrel (PLAVIX) 75 MG tablet Take 1 tablet (75 mg total) by mouth daily with breakfast. 30 tablet 0  ? Dulaglutide 0.75 MG/0.5ML SOPN Inject 0.75 mg into the skin every Wednesday. Wednesdays     ? gabapentin (NEURONTIN) 600 MG tablet Take 600 mg by mouth at bedtime.     ? hydrochlorothiazide (HYDRODIURIL) 12.5 MG tablet Take 12.5 mg by mouth daily.    ? losartan (COZAAR) 25 MG tablet Take 25 mg by mouth daily.    ? NOVOLIN 70/30 RELION (70-30) 100 UNIT/ML injection Inject 14 Units into the skin 2 (two) times daily with a meal.   12  ? Podiatric Products (GOLD BOND FOOT) CREA Apply 1 application topically 2 (two) times daily as needed (DRY SKIN). DIABETIC CREAM    ? simvastatin (ZOCOR) 20 MG tablet Take 20 mg by  mouth at bedtime.     ? ?No current facility-administered medications for this visit.  ? ? ?Physical Examination ? ?Vitals:  ? 04/18/21 1136  ?BP: 133/66  ?Pulse: 98  ?Resp: 20  ?Temp: 98.2 ?F (36.8 ?C)  ?SpO2: 94%  ?Weight: 247 lb (112 kg)  ?Height: 5\' 8"  (1.727 m)  ? ? ?Body mass index is 37.56 kg/m?. ? ?General:  Alert and oriented, no acute distress ?HEENT: Normal ?Neck: No bruit or JVD ?Pulmonary: Clear to auscultation bilaterally ?Cardiac: Regular Rate and Rhythm without murmur ?Abdomen: Soft, non-tender, non-distended, no mass, no scars ?Skin: No rash, some considering deposition from mixed arterial venous disease ?Musculoskeletal: No deformity or edema  ?Neurologic: Upper and lower extremity motor 5/5 and symmetric ? ?DATA:  ? ? ?Right ABIs appear increased compared to prior study on 04/11/2018. Left  ?ABIs appear essentially unchanged compared to prior study on 04/11/2018.  ?   ?Summary:  ?Right: Resting right ankle-brachial index indicates moderate right lower  ?extremity arterial disease.  ? ?Left: Resting left ankle-brachial index indicates moderate left lower  ?extremity arterial disease. The left toe-brachial index  is abnormal.  ? ? Summary:  ?Right Carotid: Velocities in the right ICA are consistent with a 80-99%  ?               stenosis. The ECA appears >50% stenosed.  ? ?Left Carotid: Velocities in the left ICA are

## 2021-05-20 ENCOUNTER — Ambulatory Visit: Payer: Medicare Other | Admitting: Cardiology

## 2021-05-20 ENCOUNTER — Encounter: Payer: Self-pay | Admitting: *Deleted

## 2021-05-20 ENCOUNTER — Encounter: Payer: Self-pay | Admitting: Cardiology

## 2021-05-20 VITALS — BP 140/74 | HR 82 | Ht 68.0 in | Wt 243.0 lb

## 2021-05-20 DIAGNOSIS — I6523 Occlusion and stenosis of bilateral carotid arteries: Secondary | ICD-10-CM

## 2021-05-20 DIAGNOSIS — R9431 Abnormal electrocardiogram [ECG] [EKG]: Secondary | ICD-10-CM

## 2021-05-20 DIAGNOSIS — Z01810 Encounter for preprocedural cardiovascular examination: Secondary | ICD-10-CM | POA: Diagnosis not present

## 2021-05-20 NOTE — Patient Instructions (Signed)
Medication Instructions:  Continue all current medications.  Labwork: none  Testing/Procedures: Your physician has requested that you have a lexiscan myoview. For further information please visit [email or web address]. Please follow instruction sheet, as given.  Office will contact with results via phone or letter.    Follow-Up: Pending test results   Any Other Special Instructions Will Be Listed Below (If Applicable).  If you need a refill on your cardiac medications before your next appointment, please call your pharmacy.  

## 2021-05-20 NOTE — Progress Notes (Signed)
? ? ? ?Clinical Summary ?Mr. Breyer is a 71 y.o.male seen today as a new consult, referred by Dr Virl Cagey for the following medical problems.  ? ?1.Preoperative evaluation ?- considering TCAR ?- no chest pain, no SOB/DOE ?- goes to [facility] daily, does 60 minutes ellipitical.At least 5 days a week.  ? ? ? ?2.PAD ?- multiple prior interventions ?Left SFA-pop stenting by Dr. Trula Slade ?Right great toe ray amputation by Dr. Donnetta Hutching ?Right SFA atherectomy and stenting by Dr. Oneida Alar ?- compliant with emds ? ?3. Carotid stenosis ?bilateral carotid duplex ultrasound demonstrated greater than 80% stenosis of the right internal carotid artery.  He had a follow-up CT a demonstrating roughly 80% stenosis ?Past Medical History:  ?Diagnosis Date  ? Arthritis   ? "hands" (12/27/2012)  ? High cholesterol   ? Hypertension   ? Neuropathic pain   ? PAD (peripheral artery disease) ([place])   ? Psoriasis   ? Stroke [facility]) 1999  ? "still have some speech problems at times; sometimes forget what I was going to say" (12/27/2012)  ? Type II diabetes mellitus ([place])   ? Ulcer   ? Left ankle/leg  ? ? ? ?Allergies  ?Allergen Reactions  ? Oxycodone Nausea And Vomiting  ? Glipizide   ?  Bad headache and blurred vision  ? Bactrim [Sulfamethoxazole-Trimethoprim] Itching and Rash  ? ? ? ?Current Outpatient Medications  ?Medication Sig Dispense Refill  ? aspirin EC 81 MG tablet Take 81 mg by mouth daily.    ? clopidogrel (PLAVIX) 75 MG tablet Take 1 tablet (75 mg total) by mouth daily with breakfast. 30 tablet 0  ? Dulaglutide 0.75 MG/0.5ML SOPN Inject 0.75 mg into the skin every Wednesday. Wednesdays     ? gabapentin (NEURONTIN) 600 MG tablet Take 600 mg by mouth at bedtime.     ? hydrochlorothiazide (HYDRODIURIL) 12.5 MG tablet Take 12.5 mg by mouth daily.    ? losartan (COZAAR) 25 MG tablet Take 25 mg by mouth daily.    ? NOVOLIN 70/30 RELION (70-30) 100 UNIT/ML injection Inject 14 Units into the skin 2 (two) times daily with a meal.   12  ? Podiatric Products  (GOLD BOND FOOT) CREA Apply 1 application topically 2 (two) times daily as needed (DRY SKIN). DIABETIC CREAM    ? simvastatin (ZOCOR) 20 MG tablet Take 20 mg by mouth at bedtime.     ? ?No current facility-administered medications for this visit.  ? ? ? ?Past Surgical History:  ?Procedure Laterality Date  ? ABDOMINAL AORTAGRAM N/A 03/30/2012  ? Procedure: ABDOMINAL AORTAGRAM;  Surgeon: Serafina Mitchell, MD;  Location: [facility] CATH LAB;  Service: Cardiovascular;  Laterality: N/A;  ? ABDOMINAL AORTAGRAM N/A 12/27/2012  ? Procedure: ABDOMINAL AORTAGRAM;  Surgeon: Serafina Mitchell, MD;  Location: [facility] CATH LAB;  Service: Cardiovascular;  Laterality: N/A;  ? ABDOMINAL AORTOGRAM W/LOWER EXTREMITY N/A 04/05/2017  ? Procedure: ABDOMINAL AORTOGRAM W/LOWER EXTREMITY;  Surgeon: Elam Dutch, MD;  Location: [place] CV LAB;  Service: Cardiovascular;  Laterality: N/A;  ? AMPUTATION Right 04/26/2017  ? Procedure: AMPUTATION TRANSMETATARSAL RIGHT GREAT TOE;  Surgeon: Rosetta Posner, MD;  Location: [facility] OR;  Service: Vascular;  Laterality: Right;  ? ANGIOPLASTY / STENTING FEMORAL Left 12/27/2012  ? APPLICATION OF WOUND VAC Right 04/26/2017  ? Procedure: APPLICATION OF WOUND VAC;  Surgeon: Rosetta Posner, MD;  Location: [place];  Service: Vascular;  Laterality: Right;  ? FEMORAL ARTERY STENT  03/30/2012  ? LOWER EXTREMITY ANGIOGRAM  12/27/2012  ? Procedure: LOWER EXTREMITY ANGIOGRAM;  Surgeon: Serafina Mitchell, MD;  Location: [facility] CATH LAB;  Service: Cardiovascular;;  ? LOWER EXTREMITY ANGIOGRAPHY N/A 04/19/2018  ? Procedure: LOWER EXTREMITY ANGIOGRAPHY;  Surgeon: Serafina Mitchell, MD;  Location: [place] CV LAB;  Service: Cardiovascular;  Laterality: N/A;  ? PERIPHERAL VASCULAR ATHERECTOMY Right 04/05/2017  ? Procedure: PERIPHERAL VASCULAR ATHERECTOMY;  Surgeon: Elam Dutch, MD;  Location: [place] CV LAB;  Service: Cardiovascular;  Laterality: Right;  superficial femoral  ? PERIPHERAL VASCULAR BALLOON ANGIOPLASTY  04/19/2018  ? Procedure:  PERIPHERAL VASCULAR BALLOON ANGIOPLASTY;  Surgeon: Serafina Mitchell, MD;  Location: [place] CV LAB;  Service: Cardiovascular;;  ? PERIPHERAL VASCULAR INTERVENTION Right 04/05/2017  ? Procedure: PERIPHERAL VASCULAR INTERVENTION;  Surgeon: Elam Dutch, MD;  Location: [place] CV LAB;  Service: Cardiovascular;  Laterality: Right;   Superficial femorl and external iliac  ? POPLITEAL ARTERY STENT    ? TONSILLECTOMY AND ADENOIDECTOMY    ? ? ? ?Allergies  ?Allergen Reactions  ? Oxycodone Nausea And Vomiting  ? Glipizide   ?  Bad headache and blurred vision  ? Bactrim [Sulfamethoxazole-Trimethoprim] Itching and Rash  ? ? ? ? ?Family History  ?Problem Relation Age of Onset  ? Diabetes Sister   ?     Bilateral amputation of lower legs  ? Diabetes Sister   ? Heart disease Sister 8  ?     Heart disease before age 69  ? Hypertension Sister   ? Heart attack Sister   ? Hyperlipidemia Sister   ? Hypertension Father   ? Hyperlipidemia Father   ? Hyperlipidemia Mother   ? Hypertension Mother   ? Hypertension Daughter   ? ? ? ?Social History ?Mr. Gul reports that he quit smoking about 10 years ago. His smoking use included cigarettes. He has a 135.00 pack-year smoking history. He has never used smokeless tobacco. ?Mr. Hopping reports that he does not currently use alcohol. ? ? ?Review of Systems ?CONSTITUTIONAL: No weight loss, fever, chills, weakness or fatigue.  ?HEENT: Eyes: No visual loss, blurred vision, double vision or yellow sclerae.No hearing loss, sneezing, congestion, runny nose or sore throat.  ?SKIN: No rash or itching.  ?CARDIOVASCULAR: per hpi ?RESPIRATORY: No shortness of breath, cough or sputum.  ?GASTROINTESTINAL: No anorexia, nausea, vomiting or diarrhea. No abdominal pain or blood.  ?GENITOURINARY: No burning on urination, no polyuria ?NEUROLOGICAL: No headache, dizziness, syncope, paralysis, ataxia, numbness or tingling in the extremities. No change in bowel or bladder control.  ?MUSCULOSKELETAL:  No muscle, back pain, joint pain or stiffness.  ?LYMPHATICS: No enlarged nodes. No history of splenectomy.  ?PSYCHIATRIC: No history of depression or anxiety.  ?ENDOCRINOLOGIC: No reports of sweating, cold or heat intolerance. No polyuria or polydipsia.  ?. ? ? ?Physical Examination ?Today's Vitals  ? 05/20/21 1501  ?BP: 140/74  ?Pulse: 82  ?SpO2: 98%  ?Weight: 243 lb (110.2 kg)  ?Height: 5\' 8"  (1.727 m)  ? ?Body mass index is 36.95 kg/m?. ? ?Gen: resting comfortably, no acute distress ?HEENT: no scleral icterus, pupils equal round and reactive, no palptable cervical adenopathy,  ?CV: RRR, no m/r/g no jvd ?Resp: Clear to auscultation bilaterally ?GI: abdomen is soft, non-tender, non-distended, normal bowel sounds, no hepatosplenomegaly ?MSK: extremities are warm, no edema.  ?Skin: warm, no rash ?Neuro:  no focal deficits ?Psych: appropriate affect ? ? ? ? ?Assessment and Plan  ?1.Preoperative evaluation ?- considering TCAR for carotid stenosis ?- high risk for CAD,  extensive vascular disease history ?- tolerates greater than 4 METs, however EKG shows new inferior and lateral precordial ST depressions compared to 2020 EKG.  ?-given risk factors and EKG changes would recommend lexiscan to further risk stratify prior to surgery ? ? ? ? ? ? ?Arnoldo Lenis, M.D. ?

## 2021-05-23 ENCOUNTER — Ambulatory Visit (HOSPITAL_COMMUNITY)
Admission: RE | Admit: 2021-05-23 | Discharge: 2021-05-23 | Disposition: A | Discharge status: Home / Self Care | Payer: Medicare Other | Source: Ambulatory Visit | Attending: Cardiology | Admitting: Cardiology

## 2021-05-23 ENCOUNTER — Encounter (HOSPITAL_COMMUNITY): Payer: Self-pay

## 2021-05-23 ENCOUNTER — Encounter (HOSPITAL_COMMUNITY)
Admission: RE | Admit: 2021-05-23 | Discharge: 2021-05-23 | Disposition: A | Discharge status: Home / Self Care | Payer: Medicare Other | Source: Ambulatory Visit | Attending: Cardiology | Admitting: Cardiology

## 2021-05-23 DIAGNOSIS — R9431 Abnormal electrocardiogram [ECG] [EKG]: Secondary | ICD-10-CM

## 2021-05-23 LAB — NM MYOCAR MULTI W/SPECT W/WALL MOTION / EF
LV dias vol: 195 mL (ref 62–150)
LV sys vol: 151 mL
Nuc Stress EF: 23 %
Peak HR: 114 {beats}/min
RATE: 0.6
Rest HR: 76 {beats}/min
Rest Nuclear Isotope Dose: 10.2 mCi
SDS: 2
SRS: 10
SSS: 12
ST Depression (mm): 0 mm
Stress Nuclear Isotope Dose: 31.1 mCi
TID: 1.18

## 2021-05-23 MED ORDER — SODIUM CHLORIDE FLUSH 0.9 % IV SOLN
INTRAVENOUS | Status: AC
  Administered 2021-05-23: 10 mL
  Filled 2021-05-23: qty 10

## 2021-05-23 MED ORDER — TECHNETIUM TC 99M TETROFOSMIN IV KIT
10.0000 | PACK | Freq: Once | INTRAVENOUS | Status: AC | PRN
  Administered 2021-05-23: 10.2 via INTRAVENOUS

## 2021-05-23 MED ORDER — TECHNETIUM TC 99M TETROFOSMIN IV KIT
30.0000 | PACK | Freq: Once | INTRAVENOUS | Status: AC | PRN
  Administered 2021-05-23: 31.1 via INTRAVENOUS

## 2021-05-23 MED ORDER — REGADENOSON 0.4 MG/5ML IV SOLN
INTRAVENOUS | Status: AC
  Administered 2021-05-23: 0.4 mg
  Filled 2021-05-23: qty 5

## 2021-06-02 ENCOUNTER — Telehealth: Payer: Self-pay | Admitting: Cardiology

## 2021-06-02 DIAGNOSIS — R9431 Abnormal electrocardiogram [ECG] [EKG]: Secondary | ICD-10-CM

## 2021-06-02 NOTE — Telephone Encounter (Signed)
Pt came into office requesting results from stress test  ? ?Please give pt a call  ?

## 2021-06-03 NOTE — Telephone Encounter (Signed)
-----   Message from Antoine Poche, MD sent at 06/02/2021  3:42 PM EDT ----- ?Stress test suggest pumping function of heart may be decreased, needs echo to further evaluate. Please order echo for abnormal EKG please ? ? ?Dominga Ferry MD ?

## 2021-06-03 NOTE — Telephone Encounter (Signed)
Patient informed and verbalized understanding of plan. Copy sent to PCP 

## 2021-06-18 ENCOUNTER — Ambulatory Visit (INDEPENDENT_AMBULATORY_CARE_PROVIDER_SITE_OTHER): Payer: Medicare Other

## 2021-06-18 DIAGNOSIS — R9431 Abnormal electrocardiogram [ECG] [EKG]: Secondary | ICD-10-CM | POA: Diagnosis not present

## 2021-06-18 LAB — ECHOCARDIOGRAM COMPLETE
AV Mean grad: 3.8 mmHg
AV Peak grad: 8 mmHg
Ao pk vel: 1.42 m/s
Area-P 1/2: 5.42 cm2
Calc EF: 30.2 %
MV M vel: 4.12 m/s
MV Peak grad: 67.8 mmHg
S' Lateral: 4.28 cm
Single Plane A2C EF: 26.2 %
Single Plane A4C EF: 30.8 %

## 2021-06-20 ENCOUNTER — Telehealth: Payer: Self-pay

## 2021-06-20 DIAGNOSIS — R9431 Abnormal electrocardiogram [ECG] [EKG]: Secondary | ICD-10-CM

## 2021-06-20 NOTE — Telephone Encounter (Signed)
Patient notified and agreeable with plan. Patient had no questions or concerns at this time.   Orders entered for limited echo with [facility].

## 2021-06-20 NOTE — Telephone Encounter (Signed)
-----   Message from Eustace Moore, RN sent at 06/19/2021 11:08 AM EDT -----  ----- Message ----- From: Antoine Poche, MD Sent: 06/18/2021   4:29 PM EDT To: Eustace Moore, RN  Difficult visualization on echo, needs limited study with echocontrast at [facility] to better clarify heart function   Dominga Ferry MD

## 2021-07-11 ENCOUNTER — Ambulatory Visit (HOSPITAL_COMMUNITY)
Admission: RE | Admit: 2021-07-11 | Discharge: 2021-07-11 | Disposition: A | Discharge status: Home / Self Care | Payer: Medicare Other | Source: Ambulatory Visit | Attending: Cardiology | Admitting: Cardiology

## 2021-07-11 DIAGNOSIS — R9431 Abnormal electrocardiogram [ECG] [EKG]: Secondary | ICD-10-CM | POA: Diagnosis not present

## 2021-07-11 MED ORDER — PERFLUTREN LIPID MICROSPHERE
1.0000 mL | INTRAVENOUS | Status: AC | PRN
  Administered 2021-07-11: 3 mL via INTRAVENOUS

## 2021-07-11 NOTE — Progress Notes (Signed)
*  PRELIMINARY RESULTS* Echocardiogram Limited 2-D Echocardiogram  has been performed with [facility].  Stacey Drain 07/11/2021, 9:35 AM

## 2021-07-23 ENCOUNTER — Telehealth: Payer: Self-pay | Admitting: Cardiology

## 2021-07-23 NOTE — Telephone Encounter (Signed)
Pt has not received his results from the Echo he had on 07/13/21 at [facility]  Please call [phone]

## 2021-07-24 NOTE — Telephone Encounter (Signed)
Echo does show some weakness to the heart pumping functon. Can he see me July 12 at noon to discuss findings and treatment  Dominga Ferry MD

## 2021-07-25 DIAGNOSIS — E1122 Type 2 diabetes mellitus with diabetic chronic kidney disease: Secondary | ICD-10-CM | POA: Diagnosis not present

## 2021-07-25 DIAGNOSIS — N183 Chronic kidney disease, stage 3 unspecified: Secondary | ICD-10-CM | POA: Diagnosis not present

## 2021-07-25 DIAGNOSIS — E7849 Other hyperlipidemia: Secondary | ICD-10-CM | POA: Diagnosis not present

## 2021-07-25 NOTE — Telephone Encounter (Signed)
Patient notified and verbalized understanding. Appointment scheduled. 

## 2021-07-25 NOTE — Telephone Encounter (Signed)
Left message to return call 

## 2021-08-06 ENCOUNTER — Encounter: Payer: Self-pay | Admitting: Cardiology

## 2021-08-06 ENCOUNTER — Ambulatory Visit: Payer: Medicare Other | Admitting: Cardiology

## 2021-08-06 VITALS — BP 130/76 | HR 98 | Ht 68.0 in | Wt 238.2 lb

## 2021-08-06 DIAGNOSIS — N183 Chronic kidney disease, stage 3 unspecified: Secondary | ICD-10-CM | POA: Diagnosis not present

## 2021-08-06 DIAGNOSIS — I5022 Chronic systolic (congestive) heart failure: Secondary | ICD-10-CM

## 2021-08-06 DIAGNOSIS — E782 Mixed hyperlipidemia: Secondary | ICD-10-CM | POA: Diagnosis not present

## 2021-08-06 DIAGNOSIS — E7800 Pure hypercholesterolemia, unspecified: Secondary | ICD-10-CM | POA: Diagnosis not present

## 2021-08-06 DIAGNOSIS — E7849 Other hyperlipidemia: Secondary | ICD-10-CM | POA: Diagnosis not present

## 2021-08-06 DIAGNOSIS — E1142 Type 2 diabetes mellitus with diabetic polyneuropathy: Secondary | ICD-10-CM | POA: Diagnosis not present

## 2021-08-06 MED ORDER — METOPROLOL SUCCINATE ER 25 MG PO TB24: 25.0000 mg | ORAL_TABLET | Freq: Every day | ORAL | 0 refills | Status: DC

## 2021-08-06 NOTE — Progress Notes (Signed)
Clinical Summary [name] is a 71 y.o.male seen today for follow up of the following medical problems.     1.Abnormal stress test/Systolic dysfunction - ordered for preop for TCAR - 04/2021 nuclear stress: apical to basal anteroseptal scar with very mild ishcemia toward apex. Likely inferior artifact but cannot exclude component of scar. LVEF 23# - 05/2021 echo: poor visualization, LVEF roughly 30-40%. Grade III dd.  - 06/2021 echo with contrast: LVEF 35%, global hypokiensis   - some SOB with activities with SOB,though still can do elliptical up 1 hour.  - no chest pains - he reports high K on higher doses of losartan, pcp lowered dose   2.PAD - multiple prior interventions Left SFA-pop stenting by Dr. Myra Gianotti Right great toe ray amputation by Dr. Arbie Cookey Right SFA atherectomy and stenting by Dr. Darrick Penna - compliant with emds   3. Carotid stenosis bilateral carotid duplex ultrasound demonstrated greater than 80% stenosis of the right internal carotid artery.  He had a follow-up CT a demonstrating roughly 80% stenosis  4.Preoperative evaluation - considering TCAR - no chest pain, no SOB/DOE - goes to [facility] daily, does 60 minutes ellipitical.At least 5 days a week.  Past Medical History:  Diagnosis Date   Arthritis    "hands" (12/27/2012)   High cholesterol    Hypertension    Neuropathic pain    PAD (peripheral artery disease) (HCC)    Psoriasis    Stroke (HCC) 1999   "still have some speech problems at times; sometimes forget what I was going to say" (12/27/2012)   Type II diabetes mellitus (HCC)    Ulcer    Left ankle/leg     Allergies  Allergen Reactions   Oxycodone Nausea And Vomiting   Glipizide     Bad headache and blurred vision   Bactrim [Sulfamethoxazole-Trimethoprim] Itching and Rash     Current Outpatient Medications  Medication Sig Dispense Refill   aspirin EC 81 MG tablet Take 81 mg by mouth daily.     clopidogrel (PLAVIX) 75 MG tablet Take 1  tablet (75 mg total) by mouth daily with breakfast. 30 tablet 0   Dulaglutide (TRULICITY) 1.5 MG/0.5ML SOPN Inject 1.5 mg into the skin once a week.     fluticasone (FLONASE) 50 MCG/ACT nasal spray Place 2 sprays into both nostrils as needed.     gabapentin (NEURONTIN) 600 MG tablet Take 600 mg by mouth at bedtime.      hydrochlorothiazide (HYDRODIURIL) 12.5 MG tablet Take 12.5 mg by mouth daily.     losartan (COZAAR) 25 MG tablet Take 12.5 mg by mouth daily.     metFORMIN (GLUCOPHAGE-XR) 500 MG 24 hr tablet Take 1,000 mg by mouth in the morning and at bedtime.     NOVOLIN 70/30 RELION (70-30) 100 UNIT/ML injection Inject 14 Units into the skin 2 (two) times daily with a meal.   12   Podiatric Products (GOLD BOND FOOT) CREA Apply 1 application topically 2 (two) times daily as needed (DRY SKIN). DIABETIC CREAM     simvastatin (ZOCOR) 20 MG tablet Take 20 mg by mouth at bedtime.      No current facility-administered medications for this visit.     Past Surgical History:  Procedure Laterality Date   ABDOMINAL AORTAGRAM N/A 03/30/2012   Procedure: ABDOMINAL AORTAGRAM;  Surgeon: Nada Libman, MD;  Location: [facility] CATH LAB;  Service: Cardiovascular;  Laterality: N/A;   ABDOMINAL AORTAGRAM N/A 12/27/2012   Procedure: ABDOMINAL AORTAGRAM;  Surgeon:  Serafina Mitchell, MD;  Location: [facility] CATH LAB;  Service: Cardiovascular;  Laterality: N/A;   ABDOMINAL AORTOGRAM W/LOWER EXTREMITY N/A 04/05/2017   Procedure: ABDOMINAL AORTOGRAM W/LOWER EXTREMITY;  Surgeon: Elam Dutch, MD;  Location: [place] CV LAB;  Service: Cardiovascular;  Laterality: N/A;   AMPUTATION Right 04/26/2017   Procedure: AMPUTATION TRANSMETATARSAL RIGHT GREAT TOE;  Surgeon: Rosetta Posner, MD;  [facility] OR;  Service: Vascular;  Laterality: Right;   ANGIOPLASTY / STENTING FEMORAL Left [id number]   APPLICATION OF WOUND VAC Right 04/26/2017   Procedure: APPLICATION OF WOUND VAC;  Surgeon: Rosetta Posner, MD;  Location: [place];  Service:  Vascular;  Laterality: Right;   FEMORAL ARTERY STENT  03/30/2012   LOWER EXTREMITY ANGIOGRAM  12/27/2012   Procedure: LOWER EXTREMITY ANGIOGRAM;  Surgeon: Serafina Mitchell, MD;  Location: [facility] CATH LAB;  Service: Cardiovascular;;   LOWER EXTREMITY ANGIOGRAPHY N/A 04/19/2018   Procedure: LOWER EXTREMITY ANGIOGRAPHY;  Surgeon: Serafina Mitchell, MD;  Location: [place] CV LAB;  Service: Cardiovascular;  Laterality: N/A;   PERIPHERAL VASCULAR ATHERECTOMY Right 04/05/2017   Procedure: PERIPHERAL VASCULAR ATHERECTOMY;  Surgeon: Elam Dutch, MD;  Location: [place] CV LAB;  Service: Cardiovascular;  Laterality: Right;  superficial femoral   PERIPHERAL VASCULAR BALLOON ANGIOPLASTY  04/19/2018   Procedure: PERIPHERAL VASCULAR BALLOON ANGIOPLASTY;  Surgeon: Serafina Mitchell, MD;  Location: [place] CV LAB;  Service: Cardiovascular;;   PERIPHERAL VASCULAR INTERVENTION Right 04/05/2017   Procedure: PERIPHERAL VASCULAR INTERVENTION;  Surgeon: Elam Dutch, MD;  Location: [place] CV LAB;  Service: Cardiovascular;  Laterality: Right;   Superficial femorl and external iliac   POPLITEAL ARTERY STENT     TONSILLECTOMY AND ADENOIDECTOMY       Allergies  Allergen Reactions   Oxycodone Nausea And Vomiting   Glipizide     Bad headache and blurred vision   Bactrim [Sulfamethoxazole-Trimethoprim] Itching and Rash      Family History  Problem Relation Age of Onset   Diabetes Sister        Bilateral amputation of lower legs   Diabetes Sister    Heart disease Sister 54       Heart disease before age 62   Hypertension Sister    Heart attack Sister    Hyperlipidemia Sister    Hypertension Father    Hyperlipidemia Father    Hyperlipidemia Mother    Hypertension Mother    Hypertension Daughter      Social History Mr. Cantera reports that he quit smoking about 10 years ago. His smoking use included cigarettes. He has a 135.00 pack-year smoking history. He has never used smokeless  tobacco. Mr. Suminski reports that he does not currently use alcohol.   Review of Systems CONSTITUTIONAL: No weight loss, fever, chills, weakness or fatigue.  HEENT: Eyes: No visual loss, blurred vision, double vision or yellow sclerae.No hearing loss, sneezing, congestion, runny nose or sore throat.  SKIN: No rash or itching.  CARDIOVASCULAR: per hpi RESPIRATORY: No shortness of breath, cough or sputum.  GASTROINTESTINAL: No anorexia, nausea, vomiting or diarrhea. No abdominal pain or blood.  GENITOURINARY: No burning on urination, no polyuria NEUROLOGICAL: No headache, dizziness, syncope, paralysis, ataxia, numbness or tingling in the extremities. No change in bowel or bladder control.  MUSCULOSKELETAL: No muscle, back pain, joint pain or stiffness.  LYMPHATICS: No enlarged nodes. No history of splenectomy.  PSYCHIATRIC: No history of depression or anxiety.  ENDOCRINOLOGIC: No reports of sweating, cold or heat  intolerance. No polyuria or polydipsia.  [name]   Physical Examination Today's Vitals   08/06/21 1014  BP: 130/76  Pulse: 98  SpO2: 97%  Weight: 238 lb 3.2 oz (108 kg)  Height: 5\' 8"  (1.727 m)   Body mass index is 36.22 kg/m.  Gen: resting comfortably, no acute distress HEENT: no scleral icterus, pupils equal round and reactive, no palptable cervical adenopathy,  CV: RRR, no m/r/g no jvd Resp: Clear to auscultation bilaterally GI: abdomen is soft, non-tender, non-distended, normal bowel sounds, no hepatosplenomegaly MSK: extremities are warm, 1+ bilateral LE edema Skin: warm, no rash Neuro:  no focal deficits Psych: appropriate affect   Diagnostic Studies  04/2021 nuclear stress  Findings are consistent with prior myocardial infarction with peri-infarct ischemia. The study is high risk.   No ST deviation was noted. Arrhythmias during stress: rare PACs, rare PVCs. The ECG was negative for ischemia.   LV perfusion is abnormal.  Small, moderate intensity, apical to basal  anteroseptal defect that is fixed with the exception of partial reversibility at the very apex.  There is also a small, mild intensity inferior defect from apex to base that is most prominent on rest imaging and consistent with soft tissue attenuation, although cannot completely exclude component of scar.   Left ventricular function is abnormal. Global function is severely reduced. Nuclear stress EF: 23 %. End diastolic cavity size is moderately enlarged. End systolic cavity size is moderately enlarged.   High risk study with calculated LVEF of 23% and moderate LV chamber dilatation.  Possibility of anteroseptal scar with mild apical ischemia raises possibility of ischemic cardiomyopathy.  Suggest echocardiogram for further evaluation of cardiac structure and function.     Assessment and Plan  1.Chronic systolic HF - new diagnosis, patient with preop testing for TCAR and found to have decreased LVEF - some SOB with moderate to hgih levels of exertion - start toprol 25mg  daily. Has had some kidney dysfunction last Cr 1.7, repeat bmet before considering additional HF meds and also before considering cath  Will need to postpone TCAR at this time as we optimize his cardiac managmeent.       05/2021, M.D.,

## 2021-08-06 NOTE — Patient Instructions (Signed)
Medication Instructions:  Your physician has recommended you make the following change in your medication:  Start Toprol xl 25 mg once a day Continue all other medications as directed  Labwork: BMET, CBC within 2 weeks at [facility]  Testing/Procedures: None  Follow-Up:  Your physician recommends that you schedule a follow-up appointment in: 1 month  Any Other Special Instructions Will Be Listed Below (If Applicable).  If you need a refill on your cardiac medications before your next appointment, please call your pharmacy.

## 2021-08-18 DIAGNOSIS — L03119 Cellulitis of unspecified part of limb: Secondary | ICD-10-CM | POA: Diagnosis not present

## 2021-08-18 DIAGNOSIS — F17210 Nicotine dependence, cigarettes, uncomplicated: Secondary | ICD-10-CM | POA: Diagnosis not present

## 2021-08-18 DIAGNOSIS — E1122 Type 2 diabetes mellitus with diabetic chronic kidney disease: Secondary | ICD-10-CM | POA: Diagnosis not present

## 2021-08-18 DIAGNOSIS — R609 Edema, unspecified: Secondary | ICD-10-CM | POA: Diagnosis not present

## 2021-08-18 DIAGNOSIS — I5040 Unspecified combined systolic (congestive) and diastolic (congestive) heart failure: Secondary | ICD-10-CM | POA: Diagnosis not present

## 2021-08-26 ENCOUNTER — Other Ambulatory Visit: Payer: Self-pay | Admitting: Cardiology

## 2021-08-26 ENCOUNTER — Telehealth: Payer: Self-pay | Admitting: Cardiology

## 2021-08-26 NOTE — Telephone Encounter (Signed)
error 

## 2021-09-08 DIAGNOSIS — R030 Elevated blood-pressure reading, without diagnosis of hypertension: Secondary | ICD-10-CM | POA: Diagnosis not present

## 2021-09-08 DIAGNOSIS — E1165 Type 2 diabetes mellitus with hyperglycemia: Secondary | ICD-10-CM | POA: Diagnosis not present

## 2021-09-08 DIAGNOSIS — M79672 Pain in left foot: Secondary | ICD-10-CM | POA: Diagnosis not present

## 2021-09-08 DIAGNOSIS — E1122 Type 2 diabetes mellitus with diabetic chronic kidney disease: Secondary | ICD-10-CM | POA: Diagnosis not present

## 2021-09-08 DIAGNOSIS — L03119 Cellulitis of unspecified part of limb: Secondary | ICD-10-CM | POA: Diagnosis not present

## 2021-09-08 DIAGNOSIS — L97929 Non-pressure chronic ulcer of unspecified part of left lower leg with unspecified severity: Secondary | ICD-10-CM | POA: Diagnosis not present

## 2021-09-08 DIAGNOSIS — E1142 Type 2 diabetes mellitus with diabetic polyneuropathy: Secondary | ICD-10-CM | POA: Diagnosis not present

## 2021-09-08 DIAGNOSIS — L02419 Cutaneous abscess of limb, unspecified: Secondary | ICD-10-CM | POA: Diagnosis not present

## 2021-09-08 DIAGNOSIS — E1140 Type 2 diabetes mellitus with diabetic neuropathy, unspecified: Secondary | ICD-10-CM | POA: Diagnosis not present

## 2021-09-08 DIAGNOSIS — E1151 Type 2 diabetes mellitus with diabetic peripheral angiopathy without gangrene: Secondary | ICD-10-CM | POA: Diagnosis not present

## 2021-09-08 DIAGNOSIS — E7849 Other hyperlipidemia: Secondary | ICD-10-CM | POA: Diagnosis not present

## 2021-09-08 DIAGNOSIS — B351 Tinea unguium: Secondary | ICD-10-CM | POA: Diagnosis not present

## 2021-09-08 DIAGNOSIS — F17210 Nicotine dependence, cigarettes, uncomplicated: Secondary | ICD-10-CM | POA: Diagnosis not present

## 2021-09-08 DIAGNOSIS — N183 Chronic kidney disease, stage 3 unspecified: Secondary | ICD-10-CM | POA: Diagnosis not present

## 2021-09-08 DIAGNOSIS — E782 Mixed hyperlipidemia: Secondary | ICD-10-CM | POA: Diagnosis not present

## 2021-09-08 DIAGNOSIS — M79671 Pain in right foot: Secondary | ICD-10-CM | POA: Diagnosis not present

## 2021-09-08 DIAGNOSIS — I10 Essential (primary) hypertension: Secondary | ICD-10-CM | POA: Diagnosis not present

## 2021-09-11 DIAGNOSIS — E1129 Type 2 diabetes mellitus with other diabetic kidney complication: Secondary | ICD-10-CM | POA: Diagnosis not present

## 2021-09-11 DIAGNOSIS — Z7982 Long term (current) use of aspirin: Secondary | ICD-10-CM | POA: Diagnosis not present

## 2021-09-11 DIAGNOSIS — I252 Old myocardial infarction: Secondary | ICD-10-CM | POA: Diagnosis not present

## 2021-09-11 DIAGNOSIS — I10 Essential (primary) hypertension: Secondary | ICD-10-CM | POA: Diagnosis not present

## 2021-09-11 DIAGNOSIS — I5020 Unspecified systolic (congestive) heart failure: Secondary | ICD-10-CM | POA: Diagnosis not present

## 2021-09-11 DIAGNOSIS — E875 Hyperkalemia: Secondary | ICD-10-CM | POA: Diagnosis not present

## 2021-09-11 DIAGNOSIS — S81801A Unspecified open wound, right lower leg, initial encounter: Secondary | ICD-10-CM | POA: Diagnosis not present

## 2021-09-11 DIAGNOSIS — E871 Hypo-osmolality and hyponatremia: Secondary | ICD-10-CM | POA: Diagnosis not present

## 2021-09-11 DIAGNOSIS — Z7984 Long term (current) use of oral hypoglycemic drugs: Secondary | ICD-10-CM | POA: Diagnosis not present

## 2021-09-11 DIAGNOSIS — Z7902 Long term (current) use of antithrombotics/antiplatelets: Secondary | ICD-10-CM | POA: Diagnosis not present

## 2021-09-11 DIAGNOSIS — I872 Venous insufficiency (chronic) (peripheral): Secondary | ICD-10-CM | POA: Diagnosis not present

## 2021-09-11 DIAGNOSIS — I5022 Chronic systolic (congestive) heart failure: Secondary | ICD-10-CM | POA: Diagnosis not present

## 2021-09-11 DIAGNOSIS — N1831 Chronic kidney disease, stage 3a: Secondary | ICD-10-CM | POA: Diagnosis not present

## 2021-09-11 DIAGNOSIS — I255 Ischemic cardiomyopathy: Secondary | ICD-10-CM | POA: Diagnosis not present

## 2021-09-11 DIAGNOSIS — L97929 Non-pressure chronic ulcer of unspecified part of left lower leg with unspecified severity: Secondary | ICD-10-CM | POA: Diagnosis not present

## 2021-09-11 DIAGNOSIS — E1122 Type 2 diabetes mellitus with diabetic chronic kidney disease: Secondary | ICD-10-CM | POA: Diagnosis not present

## 2021-09-11 DIAGNOSIS — I890 Lymphedema, not elsewhere classified: Secondary | ICD-10-CM | POA: Diagnosis not present

## 2021-09-11 DIAGNOSIS — I739 Peripheral vascular disease, unspecified: Secondary | ICD-10-CM | POA: Diagnosis not present

## 2021-09-11 DIAGNOSIS — I6521 Occlusion and stenosis of right carotid artery: Secondary | ICD-10-CM | POA: Diagnosis not present

## 2021-09-11 DIAGNOSIS — E1165 Type 2 diabetes mellitus with hyperglycemia: Secondary | ICD-10-CM | POA: Diagnosis not present

## 2021-09-11 DIAGNOSIS — Z885 Allergy status to narcotic agent status: Secondary | ICD-10-CM | POA: Diagnosis not present

## 2021-09-11 DIAGNOSIS — E0822 Diabetes mellitus due to underlying condition with diabetic chronic kidney disease: Secondary | ICD-10-CM | POA: Diagnosis not present

## 2021-09-11 DIAGNOSIS — E7849 Other hyperlipidemia: Secondary | ICD-10-CM | POA: Diagnosis not present

## 2021-09-11 DIAGNOSIS — E785 Hyperlipidemia, unspecified: Secondary | ICD-10-CM | POA: Diagnosis not present

## 2021-09-11 DIAGNOSIS — E1151 Type 2 diabetes mellitus with diabetic peripheral angiopathy without gangrene: Secondary | ICD-10-CM | POA: Diagnosis not present

## 2021-09-11 DIAGNOSIS — E1140 Type 2 diabetes mellitus with diabetic neuropathy, unspecified: Secondary | ICD-10-CM | POA: Diagnosis not present

## 2021-09-11 DIAGNOSIS — I743 Embolism and thrombosis of arteries of the lower extremities: Secondary | ICD-10-CM | POA: Diagnosis not present

## 2021-09-11 DIAGNOSIS — I70213 Atherosclerosis of native arteries of extremities with intermittent claudication, bilateral legs: Secondary | ICD-10-CM | POA: Diagnosis not present

## 2021-09-11 DIAGNOSIS — R54 Age-related physical debility: Secondary | ICD-10-CM | POA: Diagnosis not present

## 2021-09-11 DIAGNOSIS — R609 Edema, unspecified: Secondary | ICD-10-CM | POA: Diagnosis not present

## 2021-09-11 DIAGNOSIS — N3000 Acute cystitis without hematuria: Secondary | ICD-10-CM | POA: Diagnosis not present

## 2021-09-11 DIAGNOSIS — E1142 Type 2 diabetes mellitus with diabetic polyneuropathy: Secondary | ICD-10-CM | POA: Diagnosis not present

## 2021-09-11 DIAGNOSIS — I130 Hypertensive heart and chronic kidney disease with heart failure and stage 1 through stage 4 chronic kidney disease, or unspecified chronic kidney disease: Secondary | ICD-10-CM | POA: Diagnosis not present

## 2021-09-11 DIAGNOSIS — I999 Unspecified disorder of circulatory system: Secondary | ICD-10-CM | POA: Diagnosis not present

## 2021-09-11 DIAGNOSIS — S81802A Unspecified open wound, left lower leg, initial encounter: Secondary | ICD-10-CM | POA: Diagnosis not present

## 2021-09-11 DIAGNOSIS — E1159 Type 2 diabetes mellitus with other circulatory complications: Secondary | ICD-10-CM | POA: Diagnosis not present

## 2021-09-18 ENCOUNTER — Telehealth: Payer: Self-pay

## 2021-09-18 NOTE — Telephone Encounter (Signed)
James Dougherty called with pt present to schedule from his referral. I offered an appt next week with ABIs one day and provider appt another day, but Lupita Leash preferred the 9/1 date so they would not have to bring him on 2 separate dates/appts. She will let pt and his daughter know of this appt.

## 2021-09-21 DIAGNOSIS — I10 Essential (primary) hypertension: Secondary | ICD-10-CM | POA: Diagnosis not present

## 2021-09-21 DIAGNOSIS — E871 Hypo-osmolality and hyponatremia: Secondary | ICD-10-CM | POA: Diagnosis not present

## 2021-09-21 DIAGNOSIS — I6521 Occlusion and stenosis of right carotid artery: Secondary | ICD-10-CM | POA: Diagnosis not present

## 2021-09-21 DIAGNOSIS — E1142 Type 2 diabetes mellitus with diabetic polyneuropathy: Secondary | ICD-10-CM | POA: Diagnosis not present

## 2021-09-21 DIAGNOSIS — I5020 Unspecified systolic (congestive) heart failure: Secondary | ICD-10-CM | POA: Diagnosis not present

## 2021-09-21 DIAGNOSIS — E119 Type 2 diabetes mellitus without complications: Secondary | ICD-10-CM | POA: Diagnosis not present

## 2021-09-22 DIAGNOSIS — I10 Essential (primary) hypertension: Secondary | ICD-10-CM | POA: Diagnosis not present

## 2021-09-22 DIAGNOSIS — I5020 Unspecified systolic (congestive) heart failure: Secondary | ICD-10-CM | POA: Diagnosis not present

## 2021-09-22 DIAGNOSIS — I6521 Occlusion and stenosis of right carotid artery: Secondary | ICD-10-CM | POA: Diagnosis not present

## 2021-09-22 DIAGNOSIS — E119 Type 2 diabetes mellitus without complications: Secondary | ICD-10-CM | POA: Diagnosis not present

## 2021-09-22 DIAGNOSIS — E1142 Type 2 diabetes mellitus with diabetic polyneuropathy: Secondary | ICD-10-CM | POA: Diagnosis not present

## 2021-09-22 DIAGNOSIS — E871 Hypo-osmolality and hyponatremia: Secondary | ICD-10-CM | POA: Diagnosis not present

## 2021-09-23 ENCOUNTER — Other Ambulatory Visit: Payer: Self-pay | Admitting: *Deleted

## 2021-09-23 DIAGNOSIS — R609 Edema, unspecified: Secondary | ICD-10-CM | POA: Diagnosis not present

## 2021-09-23 DIAGNOSIS — L0390 Cellulitis, unspecified: Secondary | ICD-10-CM | POA: Diagnosis not present

## 2021-09-23 DIAGNOSIS — E871 Hypo-osmolality and hyponatremia: Secondary | ICD-10-CM | POA: Diagnosis not present

## 2021-09-23 DIAGNOSIS — E782 Mixed hyperlipidemia: Secondary | ICD-10-CM | POA: Diagnosis not present

## 2021-09-23 DIAGNOSIS — E7849 Other hyperlipidemia: Secondary | ICD-10-CM | POA: Diagnosis not present

## 2021-09-23 DIAGNOSIS — I70219 Atherosclerosis of native arteries of extremities with intermittent claudication, unspecified extremity: Secondary | ICD-10-CM

## 2021-09-23 DIAGNOSIS — Z23 Encounter for immunization: Secondary | ICD-10-CM | POA: Diagnosis not present

## 2021-09-23 DIAGNOSIS — N183 Chronic kidney disease, stage 3 unspecified: Secondary | ICD-10-CM | POA: Diagnosis not present

## 2021-09-24 ENCOUNTER — Telehealth: Payer: Self-pay | Admitting: Cardiology

## 2021-09-24 ENCOUNTER — Encounter: Payer: Self-pay | Admitting: Cardiology

## 2021-09-24 ENCOUNTER — Other Ambulatory Visit: Payer: Self-pay | Admitting: Cardiology

## 2021-09-24 ENCOUNTER — Ambulatory Visit: Payer: Medicare Other | Attending: Cardiology | Admitting: Cardiology

## 2021-09-24 VITALS — BP 138/60 | HR 64 | Ht 68.0 in | Wt 225.8 lb

## 2021-09-24 DIAGNOSIS — E1142 Type 2 diabetes mellitus with diabetic polyneuropathy: Secondary | ICD-10-CM | POA: Diagnosis not present

## 2021-09-24 DIAGNOSIS — E871 Hypo-osmolality and hyponatremia: Secondary | ICD-10-CM | POA: Diagnosis not present

## 2021-09-24 DIAGNOSIS — I5022 Chronic systolic (congestive) heart failure: Secondary | ICD-10-CM

## 2021-09-24 DIAGNOSIS — I6521 Occlusion and stenosis of right carotid artery: Secondary | ICD-10-CM | POA: Diagnosis not present

## 2021-09-24 DIAGNOSIS — I10 Essential (primary) hypertension: Secondary | ICD-10-CM | POA: Diagnosis not present

## 2021-09-24 DIAGNOSIS — L97819 Non-pressure chronic ulcer of other part of right lower leg with unspecified severity: Secondary | ICD-10-CM | POA: Diagnosis not present

## 2021-09-24 DIAGNOSIS — I872 Venous insufficiency (chronic) (peripheral): Secondary | ICD-10-CM | POA: Diagnosis not present

## 2021-09-24 DIAGNOSIS — I5020 Unspecified systolic (congestive) heart failure: Secondary | ICD-10-CM | POA: Diagnosis not present

## 2021-09-24 DIAGNOSIS — E119 Type 2 diabetes mellitus without complications: Secondary | ICD-10-CM | POA: Diagnosis not present

## 2021-09-24 MED ORDER — SODIUM CHLORIDE 0.9% FLUSH: 3.0000 mL | Freq: Two times a day (BID) | INTRAVENOUS | Status: DC

## 2021-09-24 MED ORDER — BISOPROLOL FUMARATE 5 MG PO TABS: 2.5000 mg | ORAL_TABLET | Freq: Every day | ORAL | 1 refills | Status: DC

## 2021-09-24 NOTE — Progress Notes (Signed)
Clinical Summary Mr. Bugarin is a 71 y.o.male seen today for follow up of the following medical problems.       1.Abnormal stress test/Systolic dysfunction - ordered for preop for TCAR - 04/2021 nuclear stress: apical to basal anteroseptal scar with very mild ishcemia toward apex. Likely inferior artifact but cannot exclude component of scar. LVEF 23% - 05/2021 echo: poor visualization, LVEF roughly 30-40%. Grade III dd.  - 06/2021 echo with contrast: LVEF 35%, global hypokiensis     - some SOB with activities with SOB,though still can do elliptical up 1 hour.  - no chest pains - he reports high K on higher doses of losartan, pcp lowered dose  - last visit started toprol 25mg  daily. Had some SOB and stopped taking. Does have some smoking history.    2. Hyponatremia - recent admiossion 08/2021 after pcp labs showed low sodium to 120 - thought secondary to HCTZ, poor oral intake     Other medical issues not addressed this visit   2.PAD - multiple prior interventions Left SFA-pop stenting by Dr. 09/2021 Right great toe ray amputation by Dr. Myra Gianotti Right SFA atherectomy and stenting by Dr. Arbie Cookey    3. Carotid stenosis bilateral carotid duplex ultrasound demonstrated greater than 80% stenosis of the right internal carotid artery.  He had a follow-up CT a demonstrating roughly 80% stenosis   4.Preoperative evaluation - considering TCAR - no chest pain, no SOB/DOE - goes to [facility] daily, does 60 minutes ellipitical.At least 5 days a week.       Past Medical History:  Diagnosis Date   Arthritis    "hands" (12/27/2012)   High cholesterol    Hypertension    Neuropathic pain    PAD (peripheral artery disease) (HCC)    Psoriasis    Stroke (HCC) 1999   "still have some speech problems at times; sometimes forget what I was going to say" (12/27/2012)   Type II diabetes mellitus (HCC)    Ulcer    Left ankle/leg     Allergies  Allergen Reactions   Oxycodone Nausea  And Vomiting   Glipizide     Bad headache and blurred vision   Bactrim [Sulfamethoxazole-Trimethoprim] Itching and Rash     Current Outpatient Medications  Medication Sig Dispense Refill   aspirin EC 81 MG tablet Take 81 mg by mouth daily.     clopidogrel (PLAVIX) 75 MG tablet Take 1 tablet (75 mg total) by mouth daily with breakfast. 30 tablet 0   Dulaglutide (TRULICITY) 1.5 MG/0.5ML SOPN Inject 1.5 mg into the skin once a week.     fluticasone (FLONASE) 50 MCG/ACT nasal spray Place 2 sprays into both nostrils as needed.     gabapentin (NEURONTIN) 600 MG tablet Take 600 mg by mouth at bedtime.      hydrochlorothiazide (HYDRODIURIL) 12.5 MG tablet Take 12.5 mg by mouth daily.     losartan (COZAAR) 25 MG tablet Take 12.5 mg by mouth daily.     metFORMIN (GLUCOPHAGE-XR) 500 MG 24 hr tablet Take 1,000 mg by mouth in the morning and at bedtime.     metoprolol succinate (TOPROL-XL) 25 MG 24 hr tablet TAKE 1 TABLET BY MOUTH ONCE DAILY TAKE  WITH  OR  IMMEDIATELY  FOLLOWING  A  MEAL 30 tablet 6   NOVOLIN 70/30 RELION (70-30) 100 UNIT/ML injection Inject 16 Units into the skin daily with breakfast. & 14 units in evening  12   Podiatric Products (GOLD BOND  FOOT) CREA Apply 1 application topically 2 (two) times daily as needed (DRY SKIN). DIABETIC CREAM     simvastatin (ZOCOR) 20 MG tablet Take 20 mg by mouth at bedtime.      No current facility-administered medications for this visit.     Past Surgical History:  Procedure Laterality Date   ABDOMINAL AORTAGRAM N/A 03/30/2012   Procedure: ABDOMINAL AORTAGRAM;  Surgeon: Nada Libman, MD;  Location: [facility] CATH LAB;  Service: Cardiovascular;  Laterality: N/A;   ABDOMINAL AORTAGRAM N/A 12/27/2012   Procedure: ABDOMINAL Ronny Flurry;  Surgeon: Nada Libman, MD;  Location: [facility] CATH LAB;  Service: Cardiovascular;  Laterality: N/A;   ABDOMINAL AORTOGRAM W/LOWER EXTREMITY N/A 04/05/2017   Procedure: ABDOMINAL AORTOGRAM W/LOWER EXTREMITY;  Surgeon: Sherren Kerns, MD;  Location: [facility] INVASIVE CV LAB;  Service: Cardiovascular;  Laterality: N/A;   AMPUTATION Right 04/26/2017   Procedure: AMPUTATION TRANSMETATARSAL RIGHT GREAT TOE;  Surgeon: Larina Earthly, MD;  Location: [facility] OR;  Service: Vascular;  Laterality: Right;   ANGIOPLASTY / STENTING FEMORAL Left 12/27/2012   APPLICATION OF WOUND VAC Right 04/26/2017   Procedure: APPLICATION OF WOUND VAC;  Surgeon: Larina Earthly, MD;  Location: [facility] OR;  Service: Vascular;  Laterality: Right;   FEMORAL ARTERY STENT  03/30/2012   LOWER EXTREMITY ANGIOGRAM  12/27/2012   Procedure: LOWER EXTREMITY ANGIOGRAM;  Surgeon: Nada Libman, MD;  Location: [facility] CATH LAB;  Service: Cardiovascular;;   LOWER EXTREMITY ANGIOGRAPHY N/A 04/19/2018   Procedure: LOWER EXTREMITY ANGIOGRAPHY;  Surgeon: Nada Libman, MD;  [facility]: [facility] INVASIVE CV LAB;  Service: Cardiovascular;  Laterality: N/A;   PERIPHERAL VASCULAR ATHERECTOMY Right 04/05/2017   Procedure: PERIPHERAL VASCULAR ATHERECTOMY;  Surgeon: Sherren Kerns, MD;  Location: [facility] INVASIVE CV LAB;  Service: Cardiovascular;  Laterality: Right;  superficial femoral   PERIPHERAL VASCULAR BALLOON ANGIOPLASTY  04/19/2018   Procedure: PERIPHERAL VASCULAR BALLOON ANGIOPLASTY;  Surgeon: Nada Libman, MD;  [facility] INVASIVE CV LAB;  Service: Cardiovascular;;   PERIPHERAL VASCULAR INTERVENTION Right 04/05/2017   Procedure: PERIPHERAL VASCULAR INTERVENTION;  Surgeon: Sherren Kerns, MD;  Location: [facility] INVASIVE CV LAB;  Service: Cardiovascular;  Laterality: Right;   Superficial femorl and external iliac   POPLITEAL ARTERY STENT     TONSILLECTOMY AND ADENOIDECTOMY       Allergies  Allergen Reactions   Oxycodone Nausea And Vomiting   Glipizide     Bad headache and blurred vision   Bactrim [Sulfamethoxazole-Trimethoprim] Itching and Rash      Family History  Problem Relation Age of Onset   Diabetes Sister        Bilateral amputation of lower legs   Diabetes Sister    Heart  disease Sister 54       Heart disease before age 62   Hypertension Sister    Heart attack Sister    Hyperlipidemia Sister    Hypertension Father    Hyperlipidemia Father    Hyperlipidemia Mother    Hypertension Mother    Hypertension Daughter      Social History Mr. Schneck reports that he quit smoking about 10 years ago. His smoking use included cigarettes. He has a 135.00 pack-year smoking history. He has never used smokeless tobacco. Mr. Kemler reports that he does not currently use alcohol.   Review of Systems CONSTITUTIONAL: No weight loss, fever, chills, weakness or fatigue.  HEENT: Eyes: No visual loss, blurred vision, double vision or yellow sclerae.No hearing loss, sneezing, congestion, runny nose or sore  throat.  SKIN: No rash or itching.  CARDIOVASCULAR: per hpi RESPIRATORY: per hpi GASTROINTESTINAL: No anorexia, nausea, vomiting or diarrhea. No abdominal pain or blood.  GENITOURINARY: No burning on urination, no polyuria NEUROLOGICAL: No headache, dizziness, syncope, paralysis, ataxia, numbness or tingling in the extremities. No change in bowel or bladder control.  MUSCULOSKELETAL: No muscle, back pain, joint pain or stiffness.  LYMPHATICS: No enlarged nodes. No history of splenectomy.  PSYCHIATRIC: No history of depression or anxiety.  ENDOCRINOLOGIC: No reports of sweating, cold or heat intolerance. No polyuria or polydipsia.  [name]   Physical Examination Today's Vitals   09/24/21 1023  BP: 138/60  Pulse: 64  SpO2: 97%  Weight: 225 lb 12.8 oz (102.4 kg)  Height: 5\' 8"  (1.727 m)   Body mass index is 34.33 kg/m.  Gen: resting comfortably, no acute distress HEENT: no scleral icterus, pupils equal round and reactive, no palptable cervical adenopathy,  CV: RRR, no m/r/g no jvd Resp: Clear to auscultation bilaterally GI: abdomen is soft, non-tender, non-distended, normal bowel sounds, no hepatosplenomegaly MSK: extremities are warm, no edema.  Skin: warm, no  rash Neuro:  no focal deficits Psych: appropriate affect   Diagnostic Studies  04/2021 nuclear stress  Findings are consistent with prior myocardial infarction with peri-infarct ischemia. The study is high risk.   No ST deviation was noted. Arrhythmias during stress: rare PACs, rare PVCs. The ECG was negative for ischemia.   LV perfusion is abnormal.  Small, moderate intensity, apical to basal anteroseptal defect that is fixed with the exception of partial reversibility at the very apex.  There is also a small, mild intensity inferior defect from apex to base that is most prominent on rest imaging and consistent with soft tissue attenuation, although cannot completely exclude component of scar.   Left ventricular function is abnormal. Global function is severely reduced. Nuclear stress EF: 23 %. End diastolic cavity size is moderately enlarged. End systolic cavity size is moderately enlarged.   High risk study with calculated LVEF of 23% and moderate LV chamber dilatation.  Possibility of anteroseptal scar with mild apical ischemia raises possibility of ischemic cardiomyopathy.  Suggest echocardiogram for further evaluation of cardiac structure and function.     Assessment and Plan   1.Chronic systolic HF - new diagnosis, patient with preop testing for TCAR and found to have decreased LVEF - some SOB with moderate to hgih levels of exertion -reports SOB on toprol, he stopped taking. Will try bisoprolol 2.5mg  daily - borderline renal function and also had high K on higher doses of ARB, hold on entresto for now - aldctone and SGLT2i in the near future  - plan for LHC/RHC in setting of new diagnosis of systolic HF. Extensive PAD history suspect he likely has an ischemic CM.    Shared Decision Making/Informed Consent The risks [stroke (1 in 1000), death (1 in 1000), kidney failure [usually temporary] (1 in 500), bleeding (1 in 200), allergic reaction [possibly serious] (1 in 200)], benefits  (diagnostic support and management of coronary artery disease) and alternatives of a cardiac catheterization were discussed in detail with Mr. Portales and he is willing to proceed.    F/u late Sept for additional med changes    Arnoldo Lenis, M.D.,

## 2021-09-24 NOTE — Telephone Encounter (Signed)
Pt's daughter states she is returning call in regards to labs. Requesting call back.

## 2021-09-24 NOTE — Patient Instructions (Addendum)
Medication Instructions:  Your physician has recommended you make the following change in your medication:  Stop metoprolol Start bisoprolol 2.5 mg once a day Continue all other medications as directed  Labwork: none  Testing/Procedures: See below  Follow-Up:  Your physician recommends that you schedule a follow-up appointment in: F/u late September  Any Other Special Instructions Will Be Listed Below (If Applicable).  If you need a refill on your cardiac medications before your next appointment, please call your pharmacy.   [place] [facility] OF [facility] [place] [facility] OF [facility] [address] [place] [id number] [facility] [place] [address] Dept: [phone] Loc: [phone]  James Dougherty  09/24/2021  You are scheduled for a Cardiac Catheterization on Friday, September 22 with Dr. Verne Carrow.  1. Please arrive at the [facility] (Main Entrance A) at [facility]: [address] [place], [place] [address] at 8:00 AM (This time is two hours before your procedure to ensure your preparation). Free valet parking service is available.   Special note: Every effort is made to have your procedure done on time. Please understand that emergencies sometimes delay scheduled procedures.  2. Diet: Do not eat solid foods after midnight.  The patient may have clear liquids until 5am upon the day of the procedure.   On the night before and the morning of your procedure, do not take losartan and furosemide .  Please hold trulicity and novolin the morning of procedure   On the night before and the morning of your procedure, take your Aspirin and any morning medicines NOT listed above.  You may use sips of water.  You will need to have labs (CBC, BMP) 7 days before procedure.   5. Plan for one night stay--bring personal belongings. 6. Bring a current list of your medications and current  insurance cards. 7. You MUST have a responsible person to drive you home. 8. Someone MUST be with you the first 24 hours after you arrive home or your discharge will be delayed. 9. Please wear clothes that are easy to get on and off and wear slip-on shoes.  Thank you for allowing [place] to care for you!   -- [facility]

## 2021-09-24 NOTE — Telephone Encounter (Signed)
Daughter informed that patient needs to have labs done 7 days prior to Lt and Rt Heart Cath scheduled for 9/22. States that she will take patient to [facility] to labs done.

## 2021-09-25 ENCOUNTER — Encounter (HOSPITAL_COMMUNITY): Payer: Medicare Other

## 2021-09-25 DIAGNOSIS — E871 Hypo-osmolality and hyponatremia: Secondary | ICD-10-CM | POA: Diagnosis not present

## 2021-09-25 DIAGNOSIS — I6521 Occlusion and stenosis of right carotid artery: Secondary | ICD-10-CM | POA: Diagnosis not present

## 2021-09-25 DIAGNOSIS — I10 Essential (primary) hypertension: Secondary | ICD-10-CM | POA: Diagnosis not present

## 2021-09-25 DIAGNOSIS — I5020 Unspecified systolic (congestive) heart failure: Secondary | ICD-10-CM | POA: Diagnosis not present

## 2021-09-25 DIAGNOSIS — E119 Type 2 diabetes mellitus without complications: Secondary | ICD-10-CM | POA: Diagnosis not present

## 2021-09-25 DIAGNOSIS — E1142 Type 2 diabetes mellitus with diabetic polyneuropathy: Secondary | ICD-10-CM | POA: Diagnosis not present

## 2021-09-26 ENCOUNTER — Encounter (HOSPITAL_COMMUNITY): Payer: Medicare Other

## 2021-09-26 ENCOUNTER — Ambulatory Visit: Payer: Medicare Other

## 2021-09-30 DIAGNOSIS — I872 Venous insufficiency (chronic) (peripheral): Secondary | ICD-10-CM | POA: Diagnosis not present

## 2021-09-30 DIAGNOSIS — E1151 Type 2 diabetes mellitus with diabetic peripheral angiopathy without gangrene: Secondary | ICD-10-CM | POA: Diagnosis not present

## 2021-09-30 DIAGNOSIS — L97812 Non-pressure chronic ulcer of other part of right lower leg with fat layer exposed: Secondary | ICD-10-CM | POA: Diagnosis not present

## 2021-09-30 DIAGNOSIS — E871 Hypo-osmolality and hyponatremia: Secondary | ICD-10-CM | POA: Diagnosis not present

## 2021-09-30 DIAGNOSIS — L97821 Non-pressure chronic ulcer of other part of left lower leg limited to breakdown of skin: Secondary | ICD-10-CM | POA: Diagnosis not present

## 2021-09-30 DIAGNOSIS — I890 Lymphedema, not elsewhere classified: Secondary | ICD-10-CM | POA: Diagnosis not present

## 2021-10-01 DIAGNOSIS — S81802A Unspecified open wound, left lower leg, initial encounter: Secondary | ICD-10-CM | POA: Diagnosis not present

## 2021-10-01 DIAGNOSIS — I739 Peripheral vascular disease, unspecified: Secondary | ICD-10-CM | POA: Diagnosis not present

## 2021-10-01 DIAGNOSIS — I872 Venous insufficiency (chronic) (peripheral): Secondary | ICD-10-CM | POA: Diagnosis not present

## 2021-10-01 DIAGNOSIS — S81802D Unspecified open wound, left lower leg, subsequent encounter: Secondary | ICD-10-CM | POA: Diagnosis not present

## 2021-10-01 DIAGNOSIS — I890 Lymphedema, not elsewhere classified: Secondary | ICD-10-CM | POA: Diagnosis not present

## 2021-10-01 DIAGNOSIS — E1142 Type 2 diabetes mellitus with diabetic polyneuropathy: Secondary | ICD-10-CM | POA: Diagnosis not present

## 2021-10-01 NOTE — Progress Notes (Signed)
HISTORY AND PHYSICAL     CC:  follow up. Requesting Provider:  Arlyn Dunning, MD  HPI: This is a 71 y.o. male who is here today for follow up for PAD.      Pt was last seen 04/18/2021 and at that time, he was found to have greater than 80% right ICA stenosis and he was felt to be a good candidate for TCAR.  He was sent for cardiac clearance and was found to have EF of 23% and cardiology.  It was felt his TCAR needed to be postponed to optimize his cardiac management.  He is to undergo cardiac catheterization later this month.     At that time, pt was going to the [facility] 3-4 times per week and exercising on the elliptical.  He was having stable claudication.   He was not having any rest pain or non healing wounds.  It was felt that since his claudication was stable, we would continue to follow and possibly pursue CTA of abdomen and pelvis once his TCAR was done to evaluate inflow given date suggested inflow disease.     The pt returns today for follow up for PAD.  He has hx of left SFA and popliteal stenting, right great toe ray amputation by Dr. Arbie Cookey and right SFA atherectomy and stenting by Dr. Darrick Penna.   He had arterial duplex in February 2023, which revealed advanced BLE PAD with possible inflow disease bilaterally.  He was also found to have multifocal atherosclerotic plaques in BLE with possible flow limiting stenoses in the right CFA, and left proximal SFA.  It was felt that this could be addressed with CTA abdomen and pelvis to evaluate inflow after his critical carotid stenosis was addressed.    He states that he was recently hospitalized for low sodium of 120 and when he was discharged it was 136.  He states that he has some wounds on the left leg but they have gotten better.  He does get cramping in both legs but more the left after he walks about 75 feet.  It improves with rest.  He is now walking with a walker as he states his balance has been off.  He states he has been putting aquafor  on both legs to keep them moisturized.  He is going to the wound center.  There is someone coming out tonight to wrap his leg.  He has not had any stroke symptoms.  He states he is raw in his left groin and has been putting Aquafor there too.  He is bruised from the heparin injections in the hospital.     The pt is on a statin for cholesterol management.    The pt is on an aspirin.    Other AC:  Plavix The pt is on ARB, diuretic for hypertension.  The pt does not have diabetes. Tobacco hx:  former    Past Medical History:  Diagnosis Date   Arthritis    "hands" (12/27/2012)   High cholesterol    Hypertension    Neuropathic pain    PAD (peripheral artery disease) (HCC)    Psoriasis    Stroke (HCC) 1999   "still have some speech problems at times; sometimes forget what I was going to say" (12/27/2012)   Type II diabetes mellitus (HCC)    Ulcer    Left ankle/leg    Past Surgical History:  Procedure Laterality Date   ABDOMINAL AORTAGRAM N/A 03/30/2012   Procedure: ABDOMINAL AORTAGRAM;  Surgeon:  Nada Libman, MD;  Location: [facility] CATH LAB;  Service: Cardiovascular;  Laterality: N/A;   ABDOMINAL AORTAGRAM N/A 12/27/2012   Procedure: ABDOMINAL Ronny Flurry;  Surgeon: Nada Libman, MD;  Location: [facility] CATH LAB;  Service: Cardiovascular;  Laterality: N/A;   ABDOMINAL AORTOGRAM W/LOWER EXTREMITY N/A 04/05/2017   Procedure: ABDOMINAL AORTOGRAM W/LOWER EXTREMITY;  Surgeon: Sherren Kerns, MD;  Location: [facility] INVASIVE CV LAB;  Service: Cardiovascular;  Laterality: N/A;   AMPUTATION Right 04/26/2017   Procedure: AMPUTATION TRANSMETATARSAL RIGHT GREAT TOE;  Surgeon: Larina Earthly, MD;  Location: [facility] OR;  Service: Vascular;  Laterality: Right;   ANGIOPLASTY / STENTING FEMORAL Left 12/27/2012   APPLICATION OF WOUND VAC Right 04/26/2017   Procedure: APPLICATION OF WOUND VAC;  Surgeon: Larina Earthly, MD;  Location: [facility] OR;  Service: Vascular;  Laterality: Right;   FEMORAL ARTERY STENT  03/30/2012   LOWER  EXTREMITY ANGIOGRAM  12/27/2012   Procedure: LOWER EXTREMITY ANGIOGRAM;  Surgeon: Nada Libman, MD;  Location: [facility] CATH LAB;  Service: Cardiovascular;;   LOWER EXTREMITY ANGIOGRAPHY N/A 04/19/2018   Procedure: LOWER EXTREMITY ANGIOGRAPHY;  Surgeon: Nada Libman, MD;  Location: [facility] INVASIVE CV LAB;  Service: Cardiovascular;  Laterality: N/A;   PERIPHERAL VASCULAR ATHERECTOMY Right 04/05/2017   Procedure: PERIPHERAL VASCULAR ATHERECTOMY;  Surgeon: Sherren Kerns, MD;  Location: [facility] INVASIVE CV LAB;  Service: Cardiovascular;  Laterality: Right;  superficial femoral   PERIPHERAL VASCULAR BALLOON ANGIOPLASTY  04/19/2018   Procedure: PERIPHERAL VASCULAR BALLOON ANGIOPLASTY;  Surgeon: Nada Libman, MD;  Location: [facility] INVASIVE CV LAB;  Service: Cardiovascular;;   PERIPHERAL VASCULAR INTERVENTION Right 04/05/2017   Procedure: PERIPHERAL VASCULAR INTERVENTION;  Surgeon: Sherren Kerns, MD;  Location: [facility] INVASIVE CV LAB;  Service: Cardiovascular;  Laterality: Right;   Superficial femorl and external iliac   POPLITEAL ARTERY STENT     TONSILLECTOMY AND ADENOIDECTOMY      Allergies  Allergen Reactions   Oxycodone Nausea And Vomiting   Glipizide     Bad headache and blurred vision   Bactrim [Sulfamethoxazole-Trimethoprim] Itching and Rash    Current Outpatient Medications  Medication Sig Dispense Refill   aspirin EC 81 MG tablet Take 81 mg by mouth daily.     atorvastatin (LIPITOR) 20 MG tablet Take 20 mg by mouth at bedtime.     bisoprolol (ZEBETA) 5 MG tablet Take 0.5 tablets (2.5 mg total) by mouth daily. 45 tablet 1   cephALEXin (KEFLEX) 500 MG capsule Take 500 mg by mouth 3 (three) times daily.     clopidogrel (PLAVIX) 75 MG tablet Take 1 tablet (75 mg total) by mouth daily with breakfast. 30 tablet 0   Dulaglutide (TRULICITY) 1.5 MG/0.5ML SOPN Inject 1.5 mg into the skin once a week.     fluticasone (FLONASE) 50 MCG/ACT nasal spray Place 2 sprays into both nostrils as needed.      furosemide (LASIX) 20 MG tablet Take 20 mg by mouth daily.     gabapentin (NEURONTIN) 600 MG tablet Take 600 mg by mouth at bedtime.      losartan (COZAAR) 25 MG tablet Take 12.5 mg by mouth daily.     NOVOLIN 70/30 RELION (70-30) 100 UNIT/ML injection Inject 16 Units into the skin daily with breakfast. & 14 units in evening  12   Podiatric Products (GOLD BOND FOOT) CREA Apply 1 application topically 2 (two) times daily as needed (DRY SKIN). DIABETIC CREAM     Current Facility-Administered Medications  Medication Dose Route  Frequency Provider Last Rate Last Admin   sodium chloride flush (NS) 0.9 % injection 3 mL  3 mL Intravenous Q12H Branch, Dorothe Pea, MD        Family History  Problem Relation Age of Onset   Diabetes Sister        Bilateral amputation of lower legs   Diabetes Sister    Heart disease Sister 3       Heart disease before age 35   Hypertension Sister    Heart attack Sister    Hyperlipidemia Sister    Hypertension Father    Hyperlipidemia Father    Hyperlipidemia Mother    Hypertension Mother    Hypertension Daughter     Social History   Socioeconomic History   Marital status: Single    Spouse name: Not on file   Number of children: Not on file   Years of education: Not on file   Highest education level: Not on file  Occupational History   Not on file  Tobacco Use   Smoking status: Former    Packs/day: 3.00    Years: 45.00    Total pack years: 135.00    Types: Cigarettes    Quit date: 01/07/2011    Years since quitting: 10.7   Smokeless tobacco: Never  Vaping Use   Vaping Use: Never used  Substance and Sexual Activity   Alcohol use: Not Currently    Comment: 12/27/2012 "couple times/yr I might have a drink"   Drug use: No   Sexual activity: Never  Other Topics Concern   Not on file  Social History Narrative   Not on file   Social Determinants of Health   Financial Resource Strain: Not on file  Food Insecurity: Not on file  Transportation  Needs: Not on file  Physical Activity: Not on file  Stress: Not on file  Social Connections: Not on file  Intimate Partner Violence: Not on file     REVIEW OF SYSTEMS:   [X]  denotes positive finding, [ ]  denotes negative finding Cardiac  Comments:  Chest pain or chest pressure:    Shortness of breath upon exertion: x   Short of breath when lying flat:    Irregular heart rhythm:        Vascular    Pain in calf, thigh, or hip brought on by ambulation: x   Pain in feet at night that wakes you up from your sleep:     Blood clot in your veins:    Leg swelling:         Pulmonary    Oxygen at home:    Productive cough:     Wheezing:         Neurologic    Sudden weakness in arms or legs:     Sudden numbness in arms or legs:     Sudden onset of difficulty speaking or slurred speech:    Temporary loss of vision in one eye:     Problems with dizziness:         Gastrointestinal    Blood in stool:     Vomited blood:         Genitourinary    Burning when urinating:     Blood in urine:        Psychiatric    Major depression:         Hematologic    Bleeding problems:    Problems with blood clotting too easily:  Skin    Rashes or ulcers: x       Constitutional    Fever or chills:      PHYSICAL EXAMINATION:  Today's Vitals   10/03/21 0937  Pulse: (!) 55  Resp: 20  Temp: 97.6 F (36.4 C)  TempSrc: Temporal  SpO2: 99%  Weight: 231 lb 11.2 oz (105.1 kg)  Height: 5\' 8"  (1.727 m)   Body mass index is 35.23 kg/m.   General:  WDWN in NAD; vital signs documented above Gait: Not observed HENT: WNL, normocephalic Pulmonary: normal non-labored breathing , without wheezing Cardiac: regular HR, with carotid bruit on the right Abdomen: soft, NT Skin: with rash in left groin that is malodorous Vascular Exam/Pulses:  Right Left  Radial 2+ (normal) 2+ (normal)  Femoral Unable to palpate Unable to palpate  DP monophasic Dampened monophasic  PT Brisk biphasic  Brisk monophasic  Peroneal monophasic Dampened monophasic   Extremities: without ischemic changes, without Gangrene , without cellulitis; with superficial wound LLE with BLE swelling  Left lower leg   Musculoskeletal: no muscle wasting or atrophy  Neurologic: A&O X 3 Psychiatric:  The pt has Normal affect.   Non-Invasive Vascular Imaging:   ABI's/TBI's on 10/03/2021: Right:  0.80/amp (M) - Great toe pressure: amp Left:  [place]/0.22 (dampened monophasic) - Great toe pressure: 33  Arterial duplex on 03/19/2021: IMPRESSION: 1. Advanced bilateral lower extremity peripheral artery disease with a suggestion of inflow disease bilaterally. CTA abdomen pelvis with runoff or direct angiography could be considered for further characterization. 2. Multifocal atherosclerotic plaques visualized in the bilateral lower extremities with possible flow-limiting stenoses in the right common femoral and left proximal superficial femoral arteries.  Previous ABI's/TBI's on 02/06/2021: Right:  0.70/amp - Great toe pressure: amp Left:  0.72/0.15 - Great toe pressure:  23  Previous arterial duplex on 03/19/2021: IMPRESSION: 1. Advanced bilateral lower extremity peripheral artery disease with a suggestion of inflow disease bilaterally. CTA abdomen pelvis with runoff or direct angiography could be considered for further characterization. 2. Multifocal atherosclerotic plaques visualized in the bilateral lower extremities with possible flow-limiting stenoses in the right common femoral and left proximal superficial femoral arteries.    ASSESSMENT/PLAN:: 71 y.o. male here for follow up for PAD and carotid artery stenosis  PAD -arterial duplex in February revealed inflow disease bilaterally.  It was felt that this could be addressed once his critical carotid stenosis was addressed.  He comes in today for repeat ABI.   He states that he is going to the wound center for wounds on the LLE and these have improved.  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   -recommend mild compression with ace wrap and elevation if he can tolerate to help with the swelling to help heal these wounds.   -difficulty with palpating femoral pulses.   Yeast infection left groin-diflucan 100mg  daily x 3 days as well as nystatin powder sent to his pharmacy.  Advised him to keep groin dry and to stop putting Aquafor on the left groin.  Carotid artery stenosis -pt for TCAR (right) once optimized by cardiology.  He is scheduled for cardia catheterization in a couple of weeks.     Discussed pt with Dr. March.  We will see him back in a couple of months once he has recovered from his cardiac issues to address his inflow disease as well as his carotid stenosis.  We will get carotid duplex when he returns.  He does not need  repeat ABI at that visit.       Doreatha MassedSamantha Aldin Drees, [facility] [phone]  Clinic MD:   Karin Lieuobins

## 2021-10-02 ENCOUNTER — Ambulatory Visit: Payer: Medicare Other

## 2021-10-02 DIAGNOSIS — L97812 Non-pressure chronic ulcer of other part of right lower leg with fat layer exposed: Secondary | ICD-10-CM | POA: Diagnosis not present

## 2021-10-02 DIAGNOSIS — I872 Venous insufficiency (chronic) (peripheral): Secondary | ICD-10-CM | POA: Diagnosis not present

## 2021-10-02 DIAGNOSIS — I890 Lymphedema, not elsewhere classified: Secondary | ICD-10-CM | POA: Diagnosis not present

## 2021-10-02 DIAGNOSIS — L97821 Non-pressure chronic ulcer of other part of left lower leg limited to breakdown of skin: Secondary | ICD-10-CM | POA: Diagnosis not present

## 2021-10-02 DIAGNOSIS — E1151 Type 2 diabetes mellitus with diabetic peripheral angiopathy without gangrene: Secondary | ICD-10-CM | POA: Diagnosis not present

## 2021-10-02 DIAGNOSIS — E871 Hypo-osmolality and hyponatremia: Secondary | ICD-10-CM | POA: Diagnosis not present

## 2021-10-03 ENCOUNTER — Other Ambulatory Visit (HOSPITAL_COMMUNITY)
Admission: RE | Admit: 2021-10-03 | Discharge: 2021-10-03 | Disposition: A | Discharge status: Home / Self Care | Payer: Medicare Other | Source: Ambulatory Visit | Attending: Vascular Surgery | Admitting: Vascular Surgery

## 2021-10-03 ENCOUNTER — Ambulatory Visit (HOSPITAL_COMMUNITY)
Admission: RE | Admit: 2021-10-03 | Discharge: 2021-10-03 | Disposition: A | Discharge status: Home / Self Care | Payer: Medicare Other | Source: Ambulatory Visit | Attending: Vascular Surgery | Admitting: Vascular Surgery

## 2021-10-03 ENCOUNTER — Ambulatory Visit: Payer: Medicare Other | Admitting: Physician Assistant

## 2021-10-03 ENCOUNTER — Encounter: Payer: Self-pay | Admitting: Physician Assistant

## 2021-10-03 VITALS — HR 55 | Temp 97.6°F | Resp 20 | Ht 68.0 in | Wt 231.7 lb

## 2021-10-03 DIAGNOSIS — I70219 Atherosclerosis of native arteries of extremities with intermittent claudication, unspecified extremity: Secondary | ICD-10-CM | POA: Insufficient documentation

## 2021-10-03 DIAGNOSIS — B372 Candidiasis of skin and nail: Secondary | ICD-10-CM

## 2021-10-03 DIAGNOSIS — I5022 Chronic systolic (congestive) heart failure: Secondary | ICD-10-CM | POA: Diagnosis not present

## 2021-10-03 DIAGNOSIS — I739 Peripheral vascular disease, unspecified: Secondary | ICD-10-CM | POA: Diagnosis not present

## 2021-10-03 LAB — CBC
HCT: 42.5 % (ref 39.0–52.0)
Hemoglobin: 13.8 g/dL (ref 13.0–17.0)
MCH: 31.4 pg (ref 26.0–34.0)
MCHC: 32.5 g/dL (ref 30.0–36.0)
MCV: 96.6 fL (ref 80.0–100.0)
Platelets: 303 10*3/uL (ref 150–400)
RBC: 4.4 MIL/uL (ref 4.22–5.81)
RDW: 14.5 % (ref 11.5–15.5)
WBC: 6.3 10*3/uL (ref 4.0–10.5)
nRBC: 0 % (ref 0.0–0.2)

## 2021-10-03 LAB — BASIC METABOLIC PANEL
Anion gap: 9 (ref 5–15)
BUN: 16 mg/dL (ref 8–23)
CO2: 28 mmol/L (ref 22–32)
Calcium: 9.1 mg/dL (ref 8.9–10.3)
Chloride: 104 mmol/L (ref 98–111)
Creatinine, Ser: 1.74 mg/dL — ABNORMAL HIGH (ref 0.61–1.24)
GFR, Estimated: 41 mL/min — ABNORMAL LOW (ref >60)
Glucose, Bld: 113 mg/dL — ABNORMAL HIGH (ref 70–99)
Potassium: 5.2 mmol/L — ABNORMAL HIGH (ref 3.5–5.1)
Sodium: 141 mmol/L (ref 135–145)

## 2021-10-03 MED ORDER — FLUCONAZOLE 100 MG PO TABS: 100.0000 mg | ORAL_TABLET | Freq: Every day | ORAL | 0 refills | Status: DC

## 2021-10-03 MED ORDER — NYSTATIN 100000 UNIT/GM EX POWD: 1.0000 | Freq: Three times a day (TID) | CUTANEOUS | 0 refills | Status: DC

## 2021-10-04 DIAGNOSIS — E871 Hypo-osmolality and hyponatremia: Secondary | ICD-10-CM | POA: Diagnosis not present

## 2021-10-04 DIAGNOSIS — L97821 Non-pressure chronic ulcer of other part of left lower leg limited to breakdown of skin: Secondary | ICD-10-CM | POA: Diagnosis not present

## 2021-10-04 DIAGNOSIS — I872 Venous insufficiency (chronic) (peripheral): Secondary | ICD-10-CM | POA: Diagnosis not present

## 2021-10-04 DIAGNOSIS — I890 Lymphedema, not elsewhere classified: Secondary | ICD-10-CM | POA: Diagnosis not present

## 2021-10-04 DIAGNOSIS — L97812 Non-pressure chronic ulcer of other part of right lower leg with fat layer exposed: Secondary | ICD-10-CM | POA: Diagnosis not present

## 2021-10-04 DIAGNOSIS — E1151 Type 2 diabetes mellitus with diabetic peripheral angiopathy without gangrene: Secondary | ICD-10-CM | POA: Diagnosis not present

## 2021-10-06 DIAGNOSIS — E871 Hypo-osmolality and hyponatremia: Secondary | ICD-10-CM | POA: Diagnosis not present

## 2021-10-06 DIAGNOSIS — L97821 Non-pressure chronic ulcer of other part of left lower leg limited to breakdown of skin: Secondary | ICD-10-CM | POA: Diagnosis not present

## 2021-10-06 DIAGNOSIS — E1151 Type 2 diabetes mellitus with diabetic peripheral angiopathy without gangrene: Secondary | ICD-10-CM | POA: Diagnosis not present

## 2021-10-06 DIAGNOSIS — I872 Venous insufficiency (chronic) (peripheral): Secondary | ICD-10-CM | POA: Diagnosis not present

## 2021-10-06 DIAGNOSIS — L97812 Non-pressure chronic ulcer of other part of right lower leg with fat layer exposed: Secondary | ICD-10-CM | POA: Diagnosis not present

## 2021-10-06 DIAGNOSIS — I890 Lymphedema, not elsewhere classified: Secondary | ICD-10-CM | POA: Diagnosis not present

## 2021-10-07 ENCOUNTER — Other Ambulatory Visit: Payer: Self-pay | Admitting: Physician Assistant

## 2021-10-07 DIAGNOSIS — E871 Hypo-osmolality and hyponatremia: Secondary | ICD-10-CM | POA: Diagnosis not present

## 2021-10-07 DIAGNOSIS — L97812 Non-pressure chronic ulcer of other part of right lower leg with fat layer exposed: Secondary | ICD-10-CM | POA: Diagnosis not present

## 2021-10-07 DIAGNOSIS — I890 Lymphedema, not elsewhere classified: Secondary | ICD-10-CM | POA: Diagnosis not present

## 2021-10-07 DIAGNOSIS — I872 Venous insufficiency (chronic) (peripheral): Secondary | ICD-10-CM | POA: Diagnosis not present

## 2021-10-07 DIAGNOSIS — L97821 Non-pressure chronic ulcer of other part of left lower leg limited to breakdown of skin: Secondary | ICD-10-CM | POA: Diagnosis not present

## 2021-10-07 DIAGNOSIS — E1151 Type 2 diabetes mellitus with diabetic peripheral angiopathy without gangrene: Secondary | ICD-10-CM | POA: Diagnosis not present

## 2021-10-09 DIAGNOSIS — L97812 Non-pressure chronic ulcer of other part of right lower leg with fat layer exposed: Secondary | ICD-10-CM | POA: Diagnosis not present

## 2021-10-09 DIAGNOSIS — E1151 Type 2 diabetes mellitus with diabetic peripheral angiopathy without gangrene: Secondary | ICD-10-CM | POA: Diagnosis not present

## 2021-10-09 DIAGNOSIS — I890 Lymphedema, not elsewhere classified: Secondary | ICD-10-CM | POA: Diagnosis not present

## 2021-10-09 DIAGNOSIS — L97821 Non-pressure chronic ulcer of other part of left lower leg limited to breakdown of skin: Secondary | ICD-10-CM | POA: Diagnosis not present

## 2021-10-09 DIAGNOSIS — E871 Hypo-osmolality and hyponatremia: Secondary | ICD-10-CM | POA: Diagnosis not present

## 2021-10-09 DIAGNOSIS — I872 Venous insufficiency (chronic) (peripheral): Secondary | ICD-10-CM | POA: Diagnosis not present

## 2021-10-10 ENCOUNTER — Other Ambulatory Visit: Payer: Self-pay

## 2021-10-10 DIAGNOSIS — I6529 Occlusion and stenosis of unspecified carotid artery: Secondary | ICD-10-CM

## 2021-10-10 DIAGNOSIS — E871 Hypo-osmolality and hyponatremia: Secondary | ICD-10-CM | POA: Diagnosis not present

## 2021-10-10 DIAGNOSIS — I890 Lymphedema, not elsewhere classified: Secondary | ICD-10-CM | POA: Diagnosis not present

## 2021-10-10 DIAGNOSIS — L97812 Non-pressure chronic ulcer of other part of right lower leg with fat layer exposed: Secondary | ICD-10-CM | POA: Diagnosis not present

## 2021-10-10 DIAGNOSIS — I872 Venous insufficiency (chronic) (peripheral): Secondary | ICD-10-CM | POA: Diagnosis not present

## 2021-10-10 DIAGNOSIS — E1151 Type 2 diabetes mellitus with diabetic peripheral angiopathy without gangrene: Secondary | ICD-10-CM | POA: Diagnosis not present

## 2021-10-10 DIAGNOSIS — L97821 Non-pressure chronic ulcer of other part of left lower leg limited to breakdown of skin: Secondary | ICD-10-CM | POA: Diagnosis not present

## 2021-10-13 DIAGNOSIS — L97812 Non-pressure chronic ulcer of other part of right lower leg with fat layer exposed: Secondary | ICD-10-CM | POA: Diagnosis not present

## 2021-10-13 DIAGNOSIS — L97821 Non-pressure chronic ulcer of other part of left lower leg limited to breakdown of skin: Secondary | ICD-10-CM | POA: Diagnosis not present

## 2021-10-13 DIAGNOSIS — E1151 Type 2 diabetes mellitus with diabetic peripheral angiopathy without gangrene: Secondary | ICD-10-CM | POA: Diagnosis not present

## 2021-10-13 DIAGNOSIS — I890 Lymphedema, not elsewhere classified: Secondary | ICD-10-CM | POA: Diagnosis not present

## 2021-10-13 DIAGNOSIS — E871 Hypo-osmolality and hyponatremia: Secondary | ICD-10-CM | POA: Diagnosis not present

## 2021-10-13 DIAGNOSIS — I872 Venous insufficiency (chronic) (peripheral): Secondary | ICD-10-CM | POA: Diagnosis not present

## 2021-10-14 DIAGNOSIS — I890 Lymphedema, not elsewhere classified: Secondary | ICD-10-CM | POA: Diagnosis not present

## 2021-10-14 DIAGNOSIS — E871 Hypo-osmolality and hyponatremia: Secondary | ICD-10-CM | POA: Diagnosis not present

## 2021-10-14 DIAGNOSIS — I872 Venous insufficiency (chronic) (peripheral): Secondary | ICD-10-CM | POA: Diagnosis not present

## 2021-10-14 DIAGNOSIS — E1151 Type 2 diabetes mellitus with diabetic peripheral angiopathy without gangrene: Secondary | ICD-10-CM | POA: Diagnosis not present

## 2021-10-14 DIAGNOSIS — L97812 Non-pressure chronic ulcer of other part of right lower leg with fat layer exposed: Secondary | ICD-10-CM | POA: Diagnosis not present

## 2021-10-14 DIAGNOSIS — L97821 Non-pressure chronic ulcer of other part of left lower leg limited to breakdown of skin: Secondary | ICD-10-CM | POA: Diagnosis not present

## 2021-10-15 DIAGNOSIS — L97821 Non-pressure chronic ulcer of other part of left lower leg limited to breakdown of skin: Secondary | ICD-10-CM | POA: Diagnosis not present

## 2021-10-15 DIAGNOSIS — L97812 Non-pressure chronic ulcer of other part of right lower leg with fat layer exposed: Secondary | ICD-10-CM | POA: Diagnosis not present

## 2021-10-15 DIAGNOSIS — E871 Hypo-osmolality and hyponatremia: Secondary | ICD-10-CM | POA: Diagnosis not present

## 2021-10-15 DIAGNOSIS — I890 Lymphedema, not elsewhere classified: Secondary | ICD-10-CM | POA: Diagnosis not present

## 2021-10-15 DIAGNOSIS — E1151 Type 2 diabetes mellitus with diabetic peripheral angiopathy without gangrene: Secondary | ICD-10-CM | POA: Diagnosis not present

## 2021-10-15 DIAGNOSIS — I872 Venous insufficiency (chronic) (peripheral): Secondary | ICD-10-CM | POA: Diagnosis not present

## 2021-10-16 ENCOUNTER — Telehealth: Payer: Self-pay | Admitting: *Deleted

## 2021-10-16 ENCOUNTER — Telehealth: Payer: Self-pay | Admitting: Cardiology

## 2021-10-16 DIAGNOSIS — L97821 Non-pressure chronic ulcer of other part of left lower leg limited to breakdown of skin: Secondary | ICD-10-CM | POA: Diagnosis not present

## 2021-10-16 DIAGNOSIS — L97812 Non-pressure chronic ulcer of other part of right lower leg with fat layer exposed: Secondary | ICD-10-CM | POA: Diagnosis not present

## 2021-10-16 DIAGNOSIS — I872 Venous insufficiency (chronic) (peripheral): Secondary | ICD-10-CM | POA: Diagnosis not present

## 2021-10-16 DIAGNOSIS — E1151 Type 2 diabetes mellitus with diabetic peripheral angiopathy without gangrene: Secondary | ICD-10-CM | POA: Diagnosis not present

## 2021-10-16 DIAGNOSIS — I890 Lymphedema, not elsewhere classified: Secondary | ICD-10-CM | POA: Diagnosis not present

## 2021-10-16 DIAGNOSIS — E871 Hypo-osmolality and hyponatremia: Secondary | ICD-10-CM | POA: Diagnosis not present

## 2021-10-16 NOTE — Telephone Encounter (Addendum)
Cardiac Catheterization scheduled at [facility] for: Friday October 17, 2021 1:30 PM Arrival time and place: [place] Entrance A at: 8:30 AM-pre-procedure hydration  Nothing to eat after midnight prior to procedure, clear liquids until 5 AM day of procedure.  Medication instructions: -Hold:  Lasix/Losartan-day before and day of procedure-per protocol GFR 52  Insulin-AM of procedure/1/2 usual dose HS prior to procedure  Trulicity-weekly on Saturdays -Except hold medications usual morning medications can be taken with sips of water including aspirin 81 mg and Plavix 75 mg  Confirmed patient has responsible adult to drive home post procedure and be with patient first 24 hours after arriving home.  Patient reports no new symptoms concerning for COVID-19 in the past 10 days.  Left message for patient to call back to review procedure instructions.

## 2021-10-16 NOTE — Telephone Encounter (Signed)
Says he is not sick but his daughter and son are both sick who are supposed to help him after cath. Offered to reschedule cath but says he would call us back to let us know what days will be good for him and his caregivers.  Cath lab contacted, spoke with Cece and canceled cath.

## 2021-10-16 NOTE — Telephone Encounter (Signed)
Cath cancelled, see other phone note from today.

## 2021-10-16 NOTE — Telephone Encounter (Signed)
Per Dr Esther Hardy flow rate for hydration 75 ml/hr.

## 2021-10-16 NOTE — Telephone Encounter (Signed)
Patient states he needs to reschedule his procedure tomorrow, because he is sick. He says he is driving to the [place] office now.

## 2021-10-17 ENCOUNTER — Encounter (HOSPITAL_COMMUNITY): Admission: RE | Payer: Medicare Other | Source: Home / Self Care

## 2021-10-17 ENCOUNTER — Ambulatory Visit (HOSPITAL_COMMUNITY): Admission: RE | Admit: 2021-10-17 | Payer: Medicare Other | Source: Home / Self Care | Admitting: Cardiovascular Disease

## 2021-10-17 ENCOUNTER — Encounter: Payer: Self-pay | Admitting: *Deleted

## 2021-10-17 ENCOUNTER — Telehealth: Payer: Self-pay | Admitting: Cardiology

## 2021-10-17 SURGERY — RIGHT/LEFT HEART CATH AND CORONARY ANGIOGRAPHY
Anesthesia: LOCAL

## 2021-10-17 NOTE — Telephone Encounter (Signed)
Replied via mychart.

## 2021-10-17 NOTE — Telephone Encounter (Signed)
James Dougherty walked into the office stating that he needs to re-schedule his cath.  Please call patient.

## 2021-10-18 DIAGNOSIS — E871 Hypo-osmolality and hyponatremia: Secondary | ICD-10-CM | POA: Diagnosis not present

## 2021-10-18 DIAGNOSIS — L0390 Cellulitis, unspecified: Secondary | ICD-10-CM | POA: Diagnosis not present

## 2021-10-18 DIAGNOSIS — E7849 Other hyperlipidemia: Secondary | ICD-10-CM | POA: Diagnosis not present

## 2021-10-18 DIAGNOSIS — N183 Chronic kidney disease, stage 3 unspecified: Secondary | ICD-10-CM | POA: Diagnosis not present

## 2021-10-18 DIAGNOSIS — R609 Edema, unspecified: Secondary | ICD-10-CM | POA: Diagnosis not present

## 2021-10-20 DIAGNOSIS — L97821 Non-pressure chronic ulcer of other part of left lower leg limited to breakdown of skin: Secondary | ICD-10-CM | POA: Diagnosis not present

## 2021-10-20 DIAGNOSIS — I872 Venous insufficiency (chronic) (peripheral): Secondary | ICD-10-CM | POA: Diagnosis not present

## 2021-10-20 DIAGNOSIS — E1151 Type 2 diabetes mellitus with diabetic peripheral angiopathy without gangrene: Secondary | ICD-10-CM | POA: Diagnosis not present

## 2021-10-20 DIAGNOSIS — L97812 Non-pressure chronic ulcer of other part of right lower leg with fat layer exposed: Secondary | ICD-10-CM | POA: Diagnosis not present

## 2021-10-20 DIAGNOSIS — E871 Hypo-osmolality and hyponatremia: Secondary | ICD-10-CM | POA: Diagnosis not present

## 2021-10-20 DIAGNOSIS — I890 Lymphedema, not elsewhere classified: Secondary | ICD-10-CM | POA: Diagnosis not present

## 2021-10-22 DIAGNOSIS — S81802A Unspecified open wound, left lower leg, initial encounter: Secondary | ICD-10-CM | POA: Diagnosis not present

## 2021-10-22 DIAGNOSIS — I890 Lymphedema, not elsewhere classified: Secondary | ICD-10-CM | POA: Diagnosis not present

## 2021-10-22 DIAGNOSIS — E1142 Type 2 diabetes mellitus with diabetic polyneuropathy: Secondary | ICD-10-CM | POA: Diagnosis not present

## 2021-10-22 DIAGNOSIS — I872 Venous insufficiency (chronic) (peripheral): Secondary | ICD-10-CM | POA: Diagnosis not present

## 2021-10-22 DIAGNOSIS — I739 Peripheral vascular disease, unspecified: Secondary | ICD-10-CM | POA: Diagnosis not present

## 2021-10-22 DIAGNOSIS — S81802D Unspecified open wound, left lower leg, subsequent encounter: Secondary | ICD-10-CM | POA: Diagnosis not present

## 2021-10-24 ENCOUNTER — Telehealth: Payer: Self-pay | Admitting: *Deleted

## 2021-10-24 ENCOUNTER — Other Ambulatory Visit: Payer: Self-pay | Admitting: *Deleted

## 2021-10-24 ENCOUNTER — Encounter: Payer: Self-pay | Admitting: *Deleted

## 2021-10-24 DIAGNOSIS — E871 Hypo-osmolality and hyponatremia: Secondary | ICD-10-CM | POA: Diagnosis not present

## 2021-10-24 DIAGNOSIS — Z01810 Encounter for preprocedural cardiovascular examination: Secondary | ICD-10-CM

## 2021-10-24 DIAGNOSIS — N289 Disorder of kidney and ureter, unspecified: Secondary | ICD-10-CM

## 2021-10-24 DIAGNOSIS — I890 Lymphedema, not elsewhere classified: Secondary | ICD-10-CM | POA: Diagnosis not present

## 2021-10-24 DIAGNOSIS — Z01812 Encounter for preprocedural laboratory examination: Secondary | ICD-10-CM

## 2021-10-24 DIAGNOSIS — I5022 Chronic systolic (congestive) heart failure: Secondary | ICD-10-CM

## 2021-10-24 DIAGNOSIS — L97812 Non-pressure chronic ulcer of other part of right lower leg with fat layer exposed: Secondary | ICD-10-CM | POA: Diagnosis not present

## 2021-10-24 DIAGNOSIS — L97821 Non-pressure chronic ulcer of other part of left lower leg limited to breakdown of skin: Secondary | ICD-10-CM | POA: Diagnosis not present

## 2021-10-24 DIAGNOSIS — I872 Venous insufficiency (chronic) (peripheral): Secondary | ICD-10-CM | POA: Diagnosis not present

## 2021-10-24 DIAGNOSIS — E1151 Type 2 diabetes mellitus with diabetic peripheral angiopathy without gangrene: Secondary | ICD-10-CM | POA: Diagnosis not present

## 2021-10-24 NOTE — Telephone Encounter (Signed)
The patient verbally consented for a telehealth phone visit with [facility] and understands that his/her insurance company will be billed for the encounter.  

## 2021-10-25 DIAGNOSIS — N183 Chronic kidney disease, stage 3 unspecified: Secondary | ICD-10-CM | POA: Diagnosis not present

## 2021-10-25 DIAGNOSIS — I10 Essential (primary) hypertension: Secondary | ICD-10-CM | POA: Diagnosis not present

## 2021-10-25 DIAGNOSIS — E782 Mixed hyperlipidemia: Secondary | ICD-10-CM | POA: Diagnosis not present

## 2021-10-25 DIAGNOSIS — E1165 Type 2 diabetes mellitus with hyperglycemia: Secondary | ICD-10-CM | POA: Diagnosis not present

## 2021-10-27 ENCOUNTER — Ambulatory Visit: Payer: Medicare Other | Admitting: Cardiology

## 2021-10-28 ENCOUNTER — Inpatient Hospital Stay (HOSPITAL_COMMUNITY): Payer: Medicare Other

## 2021-10-28 ENCOUNTER — Inpatient Hospital Stay (HOSPITAL_COMMUNITY)
Admission: EM | Admit: 2021-10-28 | Discharge: 2021-11-04 | DRG: 082 | Disposition: A | Discharge status: Rehabilitation Facility | Payer: Medicare Other | Attending: General Surgery | Admitting: General Surgery

## 2021-10-28 ENCOUNTER — Emergency Department (HOSPITAL_COMMUNITY): Payer: Medicare Other

## 2021-10-28 ENCOUNTER — Other Ambulatory Visit: Payer: Self-pay

## 2021-10-28 DIAGNOSIS — G935 Compression of brain: Secondary | ICD-10-CM | POA: Diagnosis not present

## 2021-10-28 DIAGNOSIS — R0689 Other abnormalities of breathing: Secondary | ICD-10-CM | POA: Diagnosis not present

## 2021-10-28 DIAGNOSIS — I5022 Chronic systolic (congestive) heart failure: Secondary | ICD-10-CM | POA: Diagnosis not present

## 2021-10-28 DIAGNOSIS — Y929 Unspecified place or not applicable: Secondary | ICD-10-CM

## 2021-10-28 DIAGNOSIS — E871 Hypo-osmolality and hyponatremia: Secondary | ICD-10-CM | POA: Diagnosis not present

## 2021-10-28 DIAGNOSIS — Z043 Encounter for examination and observation following other accident: Secondary | ICD-10-CM | POA: Diagnosis not present

## 2021-10-28 DIAGNOSIS — Z87891 Personal history of nicotine dependence: Secondary | ICD-10-CM

## 2021-10-28 DIAGNOSIS — E1140 Type 2 diabetes mellitus with diabetic neuropathy, unspecified: Secondary | ICD-10-CM | POA: Diagnosis present

## 2021-10-28 DIAGNOSIS — Z888 Allergy status to other drugs, medicaments and biological substances status: Secondary | ICD-10-CM

## 2021-10-28 DIAGNOSIS — R531 Weakness: Secondary | ICD-10-CM | POA: Diagnosis not present

## 2021-10-28 DIAGNOSIS — G4489 Other headache syndrome: Secondary | ICD-10-CM | POA: Diagnosis not present

## 2021-10-28 DIAGNOSIS — R471 Dysarthria and anarthria: Secondary | ICD-10-CM | POA: Diagnosis present

## 2021-10-28 DIAGNOSIS — I739 Peripheral vascular disease, unspecified: Secondary | ICD-10-CM

## 2021-10-28 DIAGNOSIS — D62 Acute posthemorrhagic anemia: Secondary | ICD-10-CM | POA: Diagnosis not present

## 2021-10-28 DIAGNOSIS — Z79899 Other long term (current) drug therapy: Secondary | ICD-10-CM

## 2021-10-28 DIAGNOSIS — N189 Chronic kidney disease, unspecified: Secondary | ICD-10-CM | POA: Diagnosis not present

## 2021-10-28 DIAGNOSIS — W01198A Fall on same level from slipping, tripping and stumbling with subsequent striking against other object, initial encounter: Secondary | ICD-10-CM | POA: Diagnosis present

## 2021-10-28 DIAGNOSIS — S065XAD Traumatic subdural hemorrhage with loss of consciousness status unknown, subsequent encounter: Secondary | ICD-10-CM | POA: Diagnosis not present

## 2021-10-28 DIAGNOSIS — Z6836 Body mass index (BMI) 36.0-36.9, adult: Secondary | ICD-10-CM | POA: Diagnosis not present

## 2021-10-28 DIAGNOSIS — Z833 Family history of diabetes mellitus: Secondary | ICD-10-CM

## 2021-10-28 DIAGNOSIS — I10 Essential (primary) hypertension: Secondary | ICD-10-CM | POA: Diagnosis not present

## 2021-10-28 DIAGNOSIS — Z7902 Long term (current) use of antithrombotics/antiplatelets: Secondary | ICD-10-CM

## 2021-10-28 DIAGNOSIS — G939 Disorder of brain, unspecified: Secondary | ICD-10-CM | POA: Diagnosis not present

## 2021-10-28 DIAGNOSIS — D6832 Hemorrhagic disorder due to extrinsic circulating anticoagulants: Secondary | ICD-10-CM | POA: Diagnosis not present

## 2021-10-28 DIAGNOSIS — Z794 Long term (current) use of insulin: Secondary | ICD-10-CM | POA: Diagnosis not present

## 2021-10-28 DIAGNOSIS — Z743 Need for continuous supervision: Secondary | ICD-10-CM | POA: Diagnosis not present

## 2021-10-28 DIAGNOSIS — I6782 Cerebral ischemia: Secondary | ICD-10-CM | POA: Diagnosis not present

## 2021-10-28 DIAGNOSIS — Z885 Allergy status to narcotic agent status: Secondary | ICD-10-CM | POA: Diagnosis not present

## 2021-10-28 DIAGNOSIS — S065XAA Traumatic subdural hemorrhage with loss of consciousness status unknown, initial encounter: Secondary | ICD-10-CM | POA: Diagnosis not present

## 2021-10-28 DIAGNOSIS — Z83438 Family history of other disorder of lipoprotein metabolism and other lipidemia: Secondary | ICD-10-CM

## 2021-10-28 DIAGNOSIS — Z8249 Family history of ischemic heart disease and other diseases of the circulatory system: Secondary | ICD-10-CM

## 2021-10-28 DIAGNOSIS — S3993XA Unspecified injury of pelvis, initial encounter: Secondary | ICD-10-CM | POA: Diagnosis not present

## 2021-10-28 DIAGNOSIS — Z8673 Personal history of transient ischemic attack (TIA), and cerebral infarction without residual deficits: Secondary | ICD-10-CM | POA: Diagnosis not present

## 2021-10-28 DIAGNOSIS — E1151 Type 2 diabetes mellitus with diabetic peripheral angiopathy without gangrene: Secondary | ICD-10-CM | POA: Diagnosis present

## 2021-10-28 DIAGNOSIS — I6523 Occlusion and stenosis of bilateral carotid arteries: Secondary | ICD-10-CM

## 2021-10-28 DIAGNOSIS — S069X9D Unspecified intracranial injury with loss of consciousness of unspecified duration, subsequent encounter: Secondary | ICD-10-CM | POA: Diagnosis not present

## 2021-10-28 DIAGNOSIS — R6889 Other general symptoms and signs: Secondary | ICD-10-CM | POA: Diagnosis not present

## 2021-10-28 DIAGNOSIS — Z7982 Long term (current) use of aspirin: Secondary | ICD-10-CM | POA: Diagnosis not present

## 2021-10-28 DIAGNOSIS — S065X0A Traumatic subdural hemorrhage without loss of consciousness, initial encounter: Secondary | ICD-10-CM | POA: Diagnosis not present

## 2021-10-28 DIAGNOSIS — S069X0S Unspecified intracranial injury without loss of consciousness, sequela: Secondary | ICD-10-CM | POA: Diagnosis not present

## 2021-10-28 DIAGNOSIS — E7800 Pure hypercholesterolemia, unspecified: Secondary | ICD-10-CM | POA: Diagnosis present

## 2021-10-28 DIAGNOSIS — I255 Ischemic cardiomyopathy: Secondary | ICD-10-CM | POA: Diagnosis not present

## 2021-10-28 DIAGNOSIS — Z20822 Contact with and (suspected) exposure to covid-19: Secondary | ICD-10-CM | POA: Diagnosis present

## 2021-10-28 DIAGNOSIS — E669 Obesity, unspecified: Secondary | ICD-10-CM | POA: Diagnosis present

## 2021-10-28 DIAGNOSIS — R569 Unspecified convulsions: Secondary | ICD-10-CM | POA: Diagnosis not present

## 2021-10-28 LAB — I-STAT CHEM 8, ED
BUN: 20 mg/dL (ref 8–23)
Calcium, Ion: 1.14 mmol/L — ABNORMAL LOW (ref 1.15–1.40)
Chloride: 99 mmol/L (ref 98–111)
Creatinine, Ser: 1.7 mg/dL — ABNORMAL HIGH (ref 0.61–1.24)
Glucose, Bld: 181 mg/dL — ABNORMAL HIGH (ref 70–99)
HCT: 42 % (ref 39.0–52.0)
Hemoglobin: 14.3 g/dL (ref 13.0–17.0)
Potassium: 5 mmol/L (ref 3.5–5.1)
Sodium: 137 mmol/L (ref 135–145)
TCO2: 29 mmol/L (ref 22–32)

## 2021-10-28 LAB — LACTIC ACID, PLASMA: Lactic Acid, Venous: 2.9 mmol/L (ref 0.5–1.9)

## 2021-10-28 LAB — COMPREHENSIVE METABOLIC PANEL
ALT: 19 U/L (ref 0–44)
AST: 27 U/L (ref 15–41)
Albumin: 3.5 g/dL (ref 3.5–5.0)
Alkaline Phosphatase: 61 U/L (ref 38–126)
Anion gap: 11 (ref 5–15)
BUN: 15 mg/dL (ref 8–23)
CO2: 24 mmol/L (ref 22–32)
Calcium: 8.9 mg/dL (ref 8.9–10.3)
Chloride: 103 mmol/L (ref 98–111)
Creatinine, Ser: 1.72 mg/dL — ABNORMAL HIGH (ref 0.61–1.24)
GFR, Estimated: 42 mL/min — ABNORMAL LOW (ref >60)
Glucose, Bld: 189 mg/dL — ABNORMAL HIGH (ref 70–99)
Potassium: 4.8 mmol/L (ref 3.5–5.1)
Sodium: 138 mmol/L (ref 135–145)
Total Bilirubin: 1.2 mg/dL (ref 0.3–1.2)
Total Protein: 6.3 g/dL — ABNORMAL LOW (ref 6.5–8.1)

## 2021-10-28 LAB — RESP PANEL BY RT-PCR (FLU A&B, COVID) ARPGX2
Influenza A by PCR: NEGATIVE
Influenza B by PCR: NEGATIVE
SARS Coronavirus 2 by RT PCR: NEGATIVE

## 2021-10-28 LAB — HEMOGLOBIN A1C
Hgb A1c MFr Bld: 6.4 % — ABNORMAL HIGH (ref 4.8–5.6)
Mean Plasma Glucose: 136.98 mg/dL

## 2021-10-28 LAB — CBG MONITORING, ED: Glucose-Capillary: 181 mg/dL — ABNORMAL HIGH (ref 70–99)

## 2021-10-28 LAB — TYPE AND SCREEN
ABO/RH(D): O POS
Antibody Screen: NEGATIVE

## 2021-10-28 LAB — CBC
HCT: 41.2 % (ref 39.0–52.0)
Hemoglobin: 13.5 g/dL (ref 13.0–17.0)
MCH: 31.9 pg (ref 26.0–34.0)
MCHC: 32.8 g/dL (ref 30.0–36.0)
MCV: 97.4 fL (ref 80.0–100.0)
Platelets: 199 10*3/uL (ref 150–400)
RBC: 4.23 MIL/uL (ref 4.22–5.81)
RDW: 14.6 % (ref 11.5–15.5)
WBC: 10.6 10*3/uL — ABNORMAL HIGH (ref 4.0–10.5)
nRBC: 0 % (ref 0.0–0.2)

## 2021-10-28 LAB — PROTIME-INR
INR: 1.1 (ref 0.8–1.2)
Prothrombin Time: 14 seconds (ref 11.4–15.2)

## 2021-10-28 LAB — ABO/RH: ABO/RH(D): O POS

## 2021-10-28 LAB — MRSA NEXT GEN BY PCR, NASAL: MRSA by PCR Next Gen: NOT DETECTED

## 2021-10-28 LAB — ETHANOL: Alcohol, Ethyl (B): <10 mg/dL (ref <10)

## 2021-10-28 MED ORDER — LABETALOL HCL 5 MG/ML IV SOLN
10.0000 mg | Freq: Once | INTRAVENOUS | Status: AC
  Administered 2021-10-28: 10 mg via INTRAVENOUS

## 2021-10-28 MED ORDER — CHLORHEXIDINE GLUCONATE CLOTH 2 % EX PADS
6.0000 | MEDICATED_PAD | Freq: Every day | CUTANEOUS | Status: DC
  Administered 2021-10-28 – 2021-11-04 (×5): 6 via TOPICAL

## 2021-10-28 MED ORDER — LORAZEPAM 2 MG/ML IJ SOLN
1.0000 mg | Freq: Once | INTRAMUSCULAR | Status: AC
  Administered 2021-10-28: 1 mg via INTRAVENOUS
  Filled 2021-10-28: qty 1

## 2021-10-28 MED ORDER — SODIUM CHLORIDE 0.9 % IV SOLN
0.3000 ug/kg | Freq: Once | INTRAVENOUS | Status: AC
  Administered 2021-10-28: 32.8 ug via INTRAVENOUS
  Filled 2021-10-28: qty 8.2

## 2021-10-28 MED ORDER — SODIUM CHLORIDE 0.9 % IV BOLUS: 1000.0000 mL | Freq: Once | INTRAVENOUS | Status: DC

## 2021-10-28 MED ORDER — LEVETIRACETAM IN NACL 500 MG/100ML IV SOLN
500.0000 mg | Freq: Two times a day (BID) | INTRAVENOUS | Status: DC
  Filled 2021-10-28: qty 100

## 2021-10-28 MED ORDER — MORPHINE SULFATE (PF) 4 MG/ML IV SOLN: 4.0000 mg | INTRAVENOUS | Status: DC | PRN

## 2021-10-28 MED ORDER — PANTOPRAZOLE SODIUM 40 MG PO TBEC
40.0000 mg | DELAYED_RELEASE_TABLET | Freq: Every day | ORAL | Status: DC
  Administered 2021-10-29: 40 mg via ORAL
  Filled 2021-10-28: qty 1

## 2021-10-28 MED ORDER — FENTANYL CITRATE PF 50 MCG/ML IJ SOSY: 50.0000 ug | PREFILLED_SYRINGE | INTRAMUSCULAR | Status: DC | PRN

## 2021-10-28 MED ORDER — SODIUM CHLORIDE 0.9 % IV SOLN
3000.0000 mg | Freq: Once | INTRAVENOUS | Status: AC
  Administered 2021-10-28: 3000 mg via INTRAVENOUS
  Filled 2021-10-28: qty 30

## 2021-10-28 MED ORDER — INSULIN ASPART 100 UNIT/ML IJ SOLN
0.0000 [IU] | INTRAMUSCULAR | Status: DC
  Administered 2021-10-29: 2 [IU] via SUBCUTANEOUS
  Administered 2021-10-29: 3 [IU] via SUBCUTANEOUS
  Administered 2021-10-29 (×3): 2 [IU] via SUBCUTANEOUS
  Administered 2021-10-29: 3 [IU] via SUBCUTANEOUS
  Administered 2021-10-30: 2 [IU] via SUBCUTANEOUS
  Administered 2021-10-30: 3 [IU] via SUBCUTANEOUS
  Administered 2021-10-30 – 2021-10-31 (×8): 2 [IU] via SUBCUTANEOUS
  Administered 2021-11-01: 1 [IU] via SUBCUTANEOUS
  Administered 2021-11-01 – 2021-11-02 (×5): 2 [IU] via SUBCUTANEOUS
  Administered 2021-11-02: 3 [IU] via SUBCUTANEOUS
  Administered 2021-11-02 – 2021-11-04 (×8): 2 [IU] via SUBCUTANEOUS

## 2021-10-28 MED ORDER — PANTOPRAZOLE SODIUM 40 MG IV SOLR
40.0000 mg | Freq: Every day | INTRAVENOUS | Status: DC
  Administered 2021-10-28: 40 mg via INTRAVENOUS

## 2021-10-28 MED ORDER — SODIUM CHLORIDE 0.9% IV SOLUTION: Freq: Once | INTRAVENOUS | Status: DC

## 2021-10-28 MED ORDER — MORPHINE SULFATE (PF) 2 MG/ML IV SOLN: 2.0000 mg | INTRAVENOUS | Status: DC | PRN

## 2021-10-28 MED ORDER — CLEVIDIPINE BUTYRATE 0.5 MG/ML IV EMUL
0.0000 mg/h | INTRAVENOUS | Status: DC
  Administered 2021-10-28: 2 mg/h via INTRAVENOUS

## 2021-10-28 MED ORDER — ORAL CARE MOUTH RINSE: 15.0000 mL | OROMUCOSAL | Status: DC | PRN

## 2021-10-28 MED ORDER — ONDANSETRON HCL 4 MG/2ML IJ SOLN
INTRAMUSCULAR | Status: AC
  Filled 2021-10-28: qty 2

## 2021-10-28 MED ORDER — METOPROLOL TARTRATE 5 MG/5ML IV SOLN: 5.0000 mg | Freq: Four times a day (QID) | INTRAVENOUS | Status: DC | PRN

## 2021-10-28 MED ORDER — LABETALOL HCL 5 MG/ML IV SOLN
20.0000 mg | INTRAVENOUS | Status: DC | PRN
  Administered 2021-10-31 – 2021-11-03 (×2): 20 mg via INTRAVENOUS
  Filled 2021-10-28 (×2): qty 4

## 2021-10-28 MED ORDER — CLEVIDIPINE BUTYRATE 0.5 MG/ML IV EMUL: 0.0000 mg/h | INTRAVENOUS | Status: DC

## 2021-10-28 MED ORDER — GABAPENTIN 300 MG PO CAPS
600.0000 mg | ORAL_CAPSULE | Freq: Every day | ORAL | Status: DC
  Administered 2021-10-28 – 2021-11-03 (×7): 600 mg via ORAL
  Filled 2021-10-28 (×7): qty 2

## 2021-10-28 MED ORDER — LEVETIRACETAM IN NACL 500 MG/100ML IV SOLN
500.0000 mg | Freq: Two times a day (BID) | INTRAVENOUS | Status: DC
  Administered 2021-10-28 – 2021-10-31 (×6): 500 mg via INTRAVENOUS
  Filled 2021-10-28 (×7): qty 100

## 2021-10-28 MED ORDER — SODIUM CHLORIDE 0.9 % IV SOLN: INTRAVENOUS | Status: DC

## 2021-10-28 NOTE — ED Notes (Signed)
EEG at bedside.

## 2021-10-28 NOTE — ED Notes (Signed)
Pt had an episode of vomiting

## 2021-10-28 NOTE — ED Notes (Signed)
Trauma Event Note  TRN at bedside to round. Pt alert x4, some slurred speech and left leg weakness/numbness. Neuro check completed. Requesting daily meds.  Pt needs MRI - states he needs to be sedated?   Last imported Vital Signs BP (!) 113/50   Pulse 92   Temp (!) 97 F (36.1 C) (Axillary)   Resp 18   Ht 5\' 8"  (1.727 m)   Wt 240 lb (108.9 kg)   SpO2 98%   BMI 36.49 kg/m   Trending CBC Recent Labs    10/28/21 1436 10/28/21 1510  WBC 10.6*  --   HGB 13.5 14.3  HCT 41.2 42.0  PLT 199  --     Trending Coag's Recent Labs    10/28/21 1436  INR 1.1    Trending BMET Recent Labs    10/28/21 1436 10/28/21 1510  NA 138 137  K 4.8 5.0  CL 103 99  CO2 24  --   BUN 15 20  CREATININE 1.72* 1.70*  GLUCOSE 189* 181*      James Dougherty James Dougherty  Trauma Response RN  Please call TRN at [phone] for further assistance.

## 2021-10-28 NOTE — ED Notes (Signed)
Pt more lethargic, RN ask sec to page neurosurgery STAT

## 2021-10-28 NOTE — ED Provider Notes (Signed)
[facility] EMERGENCY DEPARTMENT Provider Note   CSN: [id number] Arrival date & time: 10/28/21  1429     History  Chief Complaint  Patient presents with   James Dougherty is a 71 y.o. male.  Patient has a history of diabetes hypertension.  He fell and hit his head.  Questionable loss of consciousness.  Patient has some weakness on the left side  The history is provided by the patient and medical records. No language interpreter was used.  Fall This is a new problem. The current episode started 1 to 2 hours ago. The problem occurs rarely. The problem has not changed since onset.Pertinent negatives include no chest pain, no abdominal pain and no headaches. Nothing aggravates the symptoms. Nothing relieves the symptoms. He has tried nothing for the symptoms.       Home Medications Prior to Admission medications   Medication Sig Start Date End Date Taking? Authorizing Provider  aspirin EC 81 MG tablet Take 81 mg by mouth daily.    [provider]  atorvastatin (LIPITOR) 20 MG tablet Take 20 mg by mouth at bedtime. 09/18/21   [provider]  bisoprolol (ZEBETA) 5 MG tablet Take 0.5 tablets (2.5 mg total) by mouth daily. 09/24/21   Antoine Poche, MD  clopidogrel (PLAVIX) 75 MG tablet Take 1 tablet (75 mg total) by mouth daily with breakfast. 04/07/17   Renae Fickle, MD  Dulaglutide (TRULICITY) 1.5 MG/0.5ML SOPN Inject 1.5 mg into the skin once a week.    [provider]  fluconazole (DIFLUCAN) 100 MG tablet Take 1 tablet (100 mg total) by mouth daily. Patient not taking: Reported on 10/13/2021 10/03/21   Dara Lords, PA-C  fluticasone (FLONASE) 50 MCG/ACT nasal spray Place 2 sprays into both nostrils daily as needed for allergies. 04/25/21   [provider]  furosemide (LASIX) 20 MG tablet Take 20 mg by mouth daily. 09/01/21   [provider]  gabapentin (NEURONTIN) 600 MG tablet Take 600 mg by mouth every  evening.    [provider]  losartan (COZAAR) 25 MG tablet Take 12.5 mg by mouth daily. 12/04/20   [provider]  NOVOLIN 70/30 RELION (70-30) 100 UNIT/ML injection Inject 14-16 Units into the skin daily with breakfast. & 14 units in evening 04/14/17   [provider]  nystatin (MYCOSTATIN/NYSTOP) powder APPLY  POWDER TOPICALLY THREE TIMES DAILY Patient taking differently: Apply 1 Application topically 3 (three) times daily. 10/10/21   Rhyne, Ames Coupe, PA-C  Podiatric Products (GOLD BOND FOOT) CREA Apply 1 application topically 2 (two) times daily as needed (DRY SKIN). DIABETIC CREAM    [provider]      Allergies    Oxycodone, Glipizide, and Bactrim [sulfamethoxazole-trimethoprim]    Review of Systems   Review of Systems  Constitutional:  Negative for appetite change and fatigue.  HENT:  Negative for congestion, ear discharge and sinus pressure.   Eyes:  Negative for discharge.  Respiratory:  Negative for cough.   Cardiovascular:  Negative for chest pain.  Gastrointestinal:  Negative for abdominal pain and diarrhea.  Genitourinary:  Negative for frequency and hematuria.  Musculoskeletal:  Negative for back pain.  Skin:  Negative for rash.  Neurological:  Negative for seizures and headaches.       Headache and right leg weakness  Psychiatric/Behavioral:  Negative for hallucinations.     Physical Exam Updated Vital Signs BP (!) 139/55   Pulse 83  Temp (!) 97.5 F (36.4 C) (Oral)   Resp 16   Ht 5\' 8"  (1.727 m)   Wt 108.9 kg   SpO2 92%   BMI 36.49 kg/m  Physical Exam Vitals and nursing note reviewed.  Constitutional:      Appearance: He is well-developed.  HENT:     Head: Normocephalic.     Nose: Nose normal.  Eyes:     General: No scleral icterus.    Conjunctiva/sclera: Conjunctivae normal.  Neck:     Thyroid: No thyromegaly.  Cardiovascular:     Rate and Rhythm: Normal rate and regular rhythm.     Heart sounds: No murmur  heard.    No friction rub. No gallop.  Pulmonary:     Breath sounds: No stridor. No wheezing or rales.  Chest:     Chest wall: No tenderness.  Abdominal:     General: There is no distension.     Tenderness: There is no abdominal tenderness. There is no rebound.  Musculoskeletal:        General: Normal range of motion.     Cervical back: Neck supple.  Lymphadenopathy:     Cervical: No cervical adenopathy.  Skin:    Findings: No erythema or rash.  Neurological:     Mental Status: He is alert and oriented to person, place, and time.     Motor: No abnormal muscle tone.     Coordination: Coordination normal.     Comments: Patient has mild slurred speech mild confusion and right leg weakness  Psychiatric:        Behavior: Behavior normal.     ED Results / Procedures / Treatments   Labs (all labs ordered are listed, but only abnormal results are displayed) Labs Reviewed  CBC - Abnormal; Notable for the following components:      Result Value   WBC 10.6 (*)    All other components within normal limits  I-STAT CHEM 8, ED - Abnormal; Notable for the following components:   Creatinine, Ser 1.70 (*)    Glucose, Bld 181 (*)    Calcium, Ion 1.14 (*)    All other components within normal limits  CBG MONITORING, ED - Abnormal; Notable for the following components:   Glucose-Capillary 181 (*)    All other components within normal limits  RESP PANEL BY RT-PCR (FLU A&B, COVID) ARPGX2  COMPREHENSIVE METABOLIC PANEL  ETHANOL  PROTIME-INR  URINALYSIS, ROUTINE W REFLEX MICROSCOPIC  LACTIC ACID, PLASMA  SAMPLE TO BLOOD BANK    EKG None  Radiology CT Cervical Spine Wo Contrast  Result Date: 10/28/2021 CLINICAL DATA:  Code stroke. Code stroke with right-sided weakness, fall. EXAM: CT HEAD WITHOUT CONTRAST CT CERVICAL SPINE WITHOUT CONTRAST TECHNIQUE: Multidetector CT imaging of the head and cervical spine was performed following the standard protocol without intravenous contrast.  Multiplanar CT image reconstructions of the cervical spine were also generated. RADIATION DOSE REDUCTION: This exam was performed according to the departmental dose-optimization program which includes automated exposure control, adjustment of the mA and/or kV according to patient size and/or use of iterative reconstruction technique. COMPARISON:  CTA neck 03/19/2018 FINDINGS: CT HEAD FINDINGS Brain: There is an acute left cerebral convexity subdural hematoma measuring up to 1.1 cm in maximal thickness. Swirling hypodensity within this hematoma may reflect hyperacute blood/active bleeding. Additional acute subdural blood layering along the left aspect of the falx measures up to 0.6 cm in thickness. There is a smaller right cerebral convexity subdural hematoma which appears more  subacute in density measuring up to 3-4 mm in maximal thickness. Is trace subarachnoid hemorrhage in the left sylvian fissure There is mass effect on the underlying brain parenchyma exerted by the left-sided hematoma resulting in sulcal effacement, partial effacement of the left lateral ventricle, and 5 mm rightward midline shift. There is no evidence of acute territorial infarct. Background parenchymal volume is normal. The ventricles are otherwise normal in size. There is patchy and confluent hypodensity in the supratentorial white matter likely reflecting sequela of underlying chronic small vessel ischemic change and remote infarcts. There is no solid mass lesion. Vascular: There is calcification of the bilateral carotid siphons. Skull: Normal. Negative for fracture or focal lesion. Sinuses/Orbits: The imaged paranasal sinuses are clear. The globes and orbits are unremarkable. Other: None. CT CERVICAL SPINE FINDINGS Alignment: Normal. There is no jumped or perched facet or other evidence of traumatic malalignment. Skull base and vertebrae: Skull base alignment is maintained. Vertebral body heights are preserved. There is no suspicious  osseous lesion. Soft tissues and spinal canal: No prevertebral fluid or swelling. No visible canal hematoma. There is prominent posterior longitudinal ligament thickening with a large protrusion at C3-C4 resulting in moderate to severe spinal canal stenosis with likely cord compression, similar to the CTA neck from 03/19/2021. Disc levels: There is disc space narrowing degenerative endplate change most advanced at C5-C6. There is overall mild-to-moderate multilevel facet arthropathy. As above, there is up to moderate to severe spinal canal stenosis at C3-C4. Upper chest: There is interlobular septal thickening in the lung apices suggesting pulmonary edema. Other: None. IMPRESSION: 1. Acute left cerebral convexity subdural hematoma measuring up to 1.1 cm in thickness with the additional subdural blood layering along the left aspect of the falx resulting in partial effacement of the left lateral ventricle and up to 5 mm rightward midline shift. Swirling hypodensity within this hematoma may reflect hyperacute blood/active bleeding. 2. Smaller more subacute appearing right cerebral convexity subdural hematoma measuring up to 3-4 mm in thickness. 3. Trace subarachnoid hemorrhage in the left sylvian fissure. 4. No acute fracture or traumatic malalignment of the cervical spine. 5. Prominent posterior longitudinal ligament thickening and large protrusion at C3-C4 resulting in moderate to severe spinal canal stenosis with likely cord compression, similar to the CTA neck from 03/19/2018. Consider further evaluation with nonemergent cervical spine MRI as indicated. 6. Interlobular septal thickening in the lung apices suggesting pulmonary edema. Critical Value/emergent results were called by telephone at the time of interpretation on 10/28/2021 at 2:58 pm to provider Dr Estell Harpin, who verbally acknowledged these results. Electronically Signed   By: Lesia Hausen M.D.   On: 10/28/2021 15:12   CT HEAD CODE STROKE WO  CONTRAST  Result Date: 10/28/2021 CLINICAL DATA:  Code stroke. Code stroke with right-sided weakness, fall. EXAM: CT HEAD WITHOUT CONTRAST CT CERVICAL SPINE WITHOUT CONTRAST TECHNIQUE: Multidetector CT imaging of the head and cervical spine was performed following the standard protocol without intravenous contrast. Multiplanar CT image reconstructions of the cervical spine were also generated. RADIATION DOSE REDUCTION: This exam was performed according to the departmental dose-optimization program which includes automated exposure control, adjustment of the mA and/or kV according to patient size and/or use of iterative reconstruction technique. COMPARISON:  CTA neck 03/19/2018 FINDINGS: CT HEAD FINDINGS Brain: There is an acute left cerebral convexity subdural hematoma measuring up to 1.1 cm in maximal thickness. Swirling hypodensity within this hematoma may reflect hyperacute blood/active bleeding. Additional acute subdural blood layering along the left aspect of the falx  measures up to 0.6 cm in thickness. There is a smaller right cerebral convexity subdural hematoma which appears more subacute in density measuring up to 3-4 mm in maximal thickness. Is trace subarachnoid hemorrhage in the left sylvian fissure There is mass effect on the underlying brain parenchyma exerted by the left-sided hematoma resulting in sulcal effacement, partial effacement of the left lateral ventricle, and 5 mm rightward midline shift. There is no evidence of acute territorial infarct. Background parenchymal volume is normal. The ventricles are otherwise normal in size. There is patchy and confluent hypodensity in the supratentorial white matter likely reflecting sequela of underlying chronic small vessel ischemic change and remote infarcts. There is no solid mass lesion. Vascular: There is calcification of the bilateral carotid siphons. Skull: Normal. Negative for fracture or focal lesion. Sinuses/Orbits: The imaged paranasal sinuses  are clear. The globes and orbits are unremarkable. Other: None. CT CERVICAL SPINE FINDINGS Alignment: Normal. There is no jumped or perched facet or other evidence of traumatic malalignment. Skull base and vertebrae: Skull base alignment is maintained. Vertebral body heights are preserved. There is no suspicious osseous lesion. Soft tissues and spinal canal: No prevertebral fluid or swelling. No visible canal hematoma. There is prominent posterior longitudinal ligament thickening with a large protrusion at C3-C4 resulting in moderate to severe spinal canal stenosis with likely cord compression, similar to the CTA neck from 03/19/2021. Disc levels: There is disc space narrowing degenerative endplate change most advanced at C5-C6. There is overall mild-to-moderate multilevel facet arthropathy. As above, there is up to moderate to severe spinal canal stenosis at C3-C4. Upper chest: There is interlobular septal thickening in the lung apices suggesting pulmonary edema. Other: None. IMPRESSION: 1. Acute left cerebral convexity subdural hematoma measuring up to 1.1 cm in thickness with the additional subdural blood layering along the left aspect of the falx resulting in partial effacement of the left lateral ventricle and up to 5 mm rightward midline shift. Swirling hypodensity within this hematoma may reflect hyperacute blood/active bleeding. 2. Smaller more subacute appearing right cerebral convexity subdural hematoma measuring up to 3-4 mm in thickness. 3. Trace subarachnoid hemorrhage in the left sylvian fissure. 4. No acute fracture or traumatic malalignment of the cervical spine. 5. Prominent posterior longitudinal ligament thickening and large protrusion at C3-C4 resulting in moderate to severe spinal canal stenosis with likely cord compression, similar to the CTA neck from 03/19/2018. Consider further evaluation with nonemergent cervical spine MRI as indicated. 6. Interlobular septal thickening in the lung apices  suggesting pulmonary edema. Critical Value/emergent results were called by telephone at the time of interpretation on 10/28/2021 at 2:58 pm to provider Dr Roderic Palau, who verbally acknowledged these results. Electronically Signed   By: Valetta Mole M.D.   On: 10/28/2021 15:12   DG Chest Port 1 View  Result Date: 10/28/2021 CLINICAL DATA:  Fall in a 71 year old male. EXAM: PORTABLE CHEST 1 VIEW COMPARISON:  None available FINDINGS: EKG leads project over the chest. Cardiomediastinal contours and hilar structures are normal. Lungs are clear. Assessment limited by portable technique and patient body habitus. No visible pneumothorax. On limited assessment no acute skeletal findings. IMPRESSION: No acute cardiopulmonary disease. Assessment limited by portable technique and patient body habitus. Electronically Signed   By: Zetta Bills M.D.   On: 10/28/2021 14:52    Procedures Procedures    Medications Ordered in ED Medications  labetalol (NORMODYNE) injection 10 mg (10 mg Intravenous Given 10/28/21 1445)    And  clevidipine (CLEVIPREX) infusion 0.5 mg/mL (  0 mg/hr Intravenous Hold 10/28/21 1514)  levETIRAcetam (KEPPRA) 3,000 mg in sodium chloride 0.9 % 250 mL IVPB (has no administration in time range)  levETIRAcetam (KEPPRA) IVPB 500 mg/100 mL premix (has no administration in time range)  ondansetron (ZOFRAN) 4 MG/2ML injection (  Given 10/28/21 1506)    ED Course/ Medical Decision Making/ A&P  CRITICAL CARE Performed by: Bethann BerkshireJoseph Chelcee Korpi Total critical care time: 40 minutes Critical care time was exclusive of separately billable procedures and treating other patients. Critical care was necessary to treat or prevent imminent or life-threatening deterioration. Critical care was time spent personally by me on the following activities: development of treatment plan with patient and/or surrogate as well as nursing, discussions with consultants, evaluation of patient's response to treatment, examination of  patient, obtaining history from patient or surrogate, ordering and performing treatments and interventions, ordering and review of laboratory studies, ordering and review of radiographic studies, pulse oximetry and re-evaluation of patient's condition. Patient has a left subdural hematoma of 1.1 cm.  He has a shift to the right of 5 mm.  He is being seen by neurosurgery and trauma                         Medical Decision Making Amount and/or Complexity of Data Reviewed Labs: ordered. Radiology: ordered.  Risk Prescription drug management.   Patient with a fall and subdural he will be admitted by trauma and followed by neurosurgery and neurology        Final Clinical Impression(s) / ED Diagnoses Final diagnoses:  None    Rx / DC Orders ED Discharge Orders     None         Bethann BerkshireZammit, Verner Kopischke, MD 10/28/21 1535

## 2021-10-28 NOTE — Progress Notes (Signed)
EEG complete - results pending 

## 2021-10-28 NOTE — ED Notes (Signed)
RN delayed swallow screen due to pts lethargy and vomiting. Pt maintaining airway

## 2021-10-28 NOTE — Procedures (Signed)
Patient Name: James Dougherty  MRN: [id number]  Epilepsy Attending: Lora Havens  Referring Physician/Provider: Janine Ores, NP  Date: 10/28/2021 Duration: 24.27 mins  Patient history: 71 y.o. male with past medical history of arthritis, hyperlipidemia, hypertension, neuropathic pain, PAD, psoriasis, stroke, diabetes brought in by EMS as a level 2 trauma after he fell and hit the back of his head while he was at the tire shop with his son.  Last known well 1200 which is when the fall had occurred.  CT head confirmed a left subdural hematoma with a 5 mm rightward midline shift as well as a trace subarachnoid hemorrhage in left sylvian fissure.  There is a current concern for potential seizure activity based on observation "spaced out" while driving. EEG to evaluate for seizure  Level of alertness: Awake, drowsy  AEDs during EEG study: LEV  Technical aspects: This EEG study was done with scalp electrodes positioned according to the 10-20 International system of electrode placement. Electrical activity was reviewed with band pass filter of 1-70Hz , sensitivity of 7 uV/mm, display speed of 31mm/sec with a 60Hz  notched filter applied as appropriate. EEG data were recorded continuously and digitally stored.  Video monitoring was available and reviewed as appropriate.  Description: The posterior dominant rhythm consists of 7.5 Hz activity of moderate voltage (25-35 uV) seen predominantly in posterior head regions, symmetric and reactive to eye opening and eye closing. Drowsiness was characterized by attenuation of the posterior background rhythm. Hyperventilation and photic stimulation were not performed.     IMPRESSION: This study is within normal limits. No seizures or epileptiform discharges were seen throughout the recording.  A normal interictal EEG does not exclude the diagnosis of epilepsy.   Robie Mcniel Barbra Sarks

## 2021-10-28 NOTE — Progress Notes (Signed)
Visited with Pt to provide support. Pt going to [facility], [place], [place], [facility], Pager [phone]

## 2021-10-28 NOTE — ED Notes (Signed)
Transported to MRI with this RN at bedside
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
## 2021-10-28 NOTE — ED Notes (Signed)
Returned from MRI. Pt refused MRI without being put to sleep. Prior to going into the MRI pt stated that he had to be put to sleep for the last MRI that he had in the 90s. Pt was medicated as ordered prior to going into the MRI room without any change.
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
## 2021-10-28 NOTE — ED Notes (Signed)
Westley Foots daughter would like to be called with any changes. [phone]

## 2021-10-28 NOTE — ED Notes (Signed)
RN removed c-collar.

## 2021-10-29 ENCOUNTER — Telehealth: Payer: Medicare Other | Admitting: Cardiology

## 2021-10-29 ENCOUNTER — Inpatient Hospital Stay (HOSPITAL_COMMUNITY): Payer: Medicare Other

## 2021-10-29 DIAGNOSIS — I739 Peripheral vascular disease, unspecified: Secondary | ICD-10-CM | POA: Diagnosis not present

## 2021-10-29 DIAGNOSIS — I6523 Occlusion and stenosis of bilateral carotid arteries: Secondary | ICD-10-CM

## 2021-10-29 DIAGNOSIS — I255 Ischemic cardiomyopathy: Secondary | ICD-10-CM | POA: Diagnosis not present

## 2021-10-29 DIAGNOSIS — S065XAA Traumatic subdural hemorrhage with loss of consciousness status unknown, initial encounter: Secondary | ICD-10-CM | POA: Diagnosis not present

## 2021-10-29 LAB — URINALYSIS, ROUTINE W REFLEX MICROSCOPIC
Bilirubin Urine: NEGATIVE
Glucose, UA: 50 mg/dL — AB
Hgb urine dipstick: NEGATIVE
Ketones, ur: 20 mg/dL — AB
Leukocytes,Ua: NEGATIVE
Nitrite: NEGATIVE
Protein, ur: NEGATIVE mg/dL
Specific Gravity, Urine: 1.015 (ref 1.005–1.030)
pH: 7 (ref 5.0–8.0)

## 2021-10-29 LAB — PREPARE PLATELET PHERESIS
Unit division: 0
Unit division: 0

## 2021-10-29 LAB — BPAM PLATELET PHERESIS
Blood Product Expiration Date: 202310052359
Blood Product Expiration Date: 202310052359
ISSUE DATE / TIME: 202310031837
ISSUE DATE / TIME: 202310032318
Unit Type and Rh: 600
Unit Type and Rh: 6200

## 2021-10-29 LAB — CBC
HCT: 37.8 % — ABNORMAL LOW (ref 39.0–52.0)
Hemoglobin: 12.6 g/dL — ABNORMAL LOW (ref 13.0–17.0)
MCH: 32.1 pg (ref 26.0–34.0)
MCHC: 33.3 g/dL (ref 30.0–36.0)
MCV: 96.2 fL (ref 80.0–100.0)
Platelets: 232 10*3/uL (ref 150–400)
RBC: 3.93 MIL/uL — ABNORMAL LOW (ref 4.22–5.81)
RDW: 14.6 % (ref 11.5–15.5)
WBC: 9.9 10*3/uL (ref 4.0–10.5)
nRBC: 0 % (ref 0.0–0.2)

## 2021-10-29 LAB — GLUCOSE, CAPILLARY
Glucose-Capillary: 136 mg/dL — ABNORMAL HIGH (ref 70–99)
Glucose-Capillary: 144 mg/dL — ABNORMAL HIGH (ref 70–99)
Glucose-Capillary: 146 mg/dL — ABNORMAL HIGH (ref 70–99)
Glucose-Capillary: 148 mg/dL — ABNORMAL HIGH (ref 70–99)
Glucose-Capillary: 172 mg/dL — ABNORMAL HIGH (ref 70–99)
Glucose-Capillary: 175 mg/dL — ABNORMAL HIGH (ref 70–99)

## 2021-10-29 LAB — BASIC METABOLIC PANEL
Anion gap: 10 (ref 5–15)
BUN: 19 mg/dL (ref 8–23)
CO2: 25 mmol/L (ref 22–32)
Calcium: 8.7 mg/dL — ABNORMAL LOW (ref 8.9–10.3)
Chloride: 101 mmol/L (ref 98–111)
Creatinine, Ser: 1.63 mg/dL — ABNORMAL HIGH (ref 0.61–1.24)
GFR, Estimated: 45 mL/min — ABNORMAL LOW (ref >60)
Glucose, Bld: 164 mg/dL — ABNORMAL HIGH (ref 70–99)
Potassium: 4.9 mmol/L (ref 3.5–5.1)
Sodium: 136 mmol/L (ref 135–145)

## 2021-10-29 MED ORDER — ACETAMINOPHEN 325 MG PO TABS
650.0000 mg | ORAL_TABLET | ORAL | Status: DC | PRN
  Administered 2021-10-29: 650 mg via ORAL
  Filled 2021-10-29: qty 2

## 2021-10-29 MED ORDER — ONDANSETRON HCL 4 MG/2ML IJ SOLN
4.0000 mg | Freq: Four times a day (QID) | INTRAMUSCULAR | Status: DC | PRN
  Administered 2021-10-29 – 2021-10-31 (×2): 4 mg via INTRAVENOUS
  Filled 2021-10-29 (×2): qty 2

## 2021-10-29 MED ORDER — ONDANSETRON HCL 4 MG/2ML IJ SOLN: 4.0000 mg | Freq: Four times a day (QID) | INTRAMUSCULAR | Status: DC
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
## 2021-10-29 NOTE — Progress Notes (Signed)
ED RN reported to this RN that patient failed MRI with Ativan r/t claustrophobia. Patient and daughter reported that in 1990s patient needed "conscious sedation" for MRI then. Dr. Leonel Ramsay made aware, no further orders at this time.

## 2021-10-29 NOTE — TOC CAGE-AID Note (Signed)
Transition of Care [facility]) - CAGE-AID Screening   Patient Details  Name: RAJEEV ESCUE MRN: [id number] Date of Birth: [birth year]  Transition of Care [facility]) CM/SW Contact:    Army Melia, RN Phone [phone] 10/29/2021, 12:43 AM   Clinical Narrative:  No drug or alcohol use hx, no resources indicated.  CAGE-AID Screening:    Have You Ever Felt You Ought to Cut Down on Your Drinking or Drug Use?: No Have People Annoyed You By Critizing Your Drinking Or Drug Use?: No Have You Felt Bad Or Guilty About Your Drinking Or Drug Use?: No Have You Ever Had a Drink or Used Drugs First Thing In The Morning to Steady Your Nerves or to Get Rid of a Hangover?: No CAGE-AID Score: 0  Substance Abuse Education Offered: No

## 2021-10-29 NOTE — Consult Note (Signed)
[place] Nurse Consult Note: Reason for Consult: Evaluate bilateral lower legs for edema.  Resolving blisters to left lateral lower leg. Wound type: Neuropathic with PAD.  Wears ace wrap for compression at home.  Pressure Injury POA: NA Measurement: 2 healed blisters on left anterior/lateral leg.  Has been using aquacel Ag at home and kerlix/ace for compression.   Wound bed: healed Drainage (amount, consistency, odor) none Periwound: both legs are shiny and hairless with hemosiderin staining present.  Edema to both legs.  Right foot with great toe amputation.  Dressing procedure/placement/frequency: CLeanse bilateral legs with soap and water.  Apply Xeroform gauze to left anterior leg (site of healed blisters) cover with gauze.  Wrap BOTH lower legs with kerlix from below toes to below the knee and secure with ace wrap also wrapped from below toes to below knee for light compression.  Will not follow at this time.  Please re-consult if needed.  Domenic Moras MSN, RN, FNP-BC [facility] Wound, Ostomy, Continence Nurse Pager [phone]

## 2021-10-29 NOTE — Progress Notes (Addendum)
Follow up - Trauma Critical Care  Patient Details:    James Dougherty is an 71 y.o. male.  Lines/tubes : External Urinary Catheter (Active)    Microbiology/Sepsis markers: Results for orders placed or performed during the hospital encounter of 10/28/21  Resp Panel by RT-PCR (Flu A&B, Covid) Anterior Nasal Swab     Status: None   Collection Time: 10/28/21  2:38 PM   Specimen: Anterior Nasal Swab  Result Value Ref Range Status   SARS Coronavirus 2 by RT PCR NEGATIVE NEGATIVE Final    Comment: (NOTE) SARS-CoV-2 target nucleic acids are NOT DETECTED.  The SARS-CoV-2 RNA is generally detectable in upper respiratory specimens during the acute phase of infection. The lowest concentration of SARS-CoV-2 viral copies this assay can detect is 138 copies/mL. A negative result does not preclude SARS-Cov-2 infection and should not be used as the sole basis for treatment or other patient management decisions. A negative result may occur with  improper specimen collection/handling, submission of specimen other than nasopharyngeal swab, presence of viral mutation(s) within the areas targeted by this assay, and inadequate number of viral copies(<138 copies/mL). A negative result must be combined with clinical observations, patient history, and epidemiological information. The expected result is Negative.  Fact Sheet for Patients:  BloggerCourse.com  Fact Sheet for Healthcare Providers:  SeriousBroker.it  This test is no t yet approved or cleared by the [place] FDA and  has been authorized for detection and/or diagnosis of SARS-CoV-2 by FDA under an Emergency Use Authorization (EUA). This EUA will remain  in effect (meaning this test can be used) for the duration of the COVID-19 declaration under Section 564(b)(1) of the Act, 21 U.S.C.section 360bbb-3(b)(1), unless the authorization is terminated  or revoked sooner.       Influenza  A by PCR NEGATIVE NEGATIVE Final   Influenza B by PCR NEGATIVE NEGATIVE Final    Comment: (NOTE) The Xpert Xpress SARS-CoV-2/FLU/RSV plus assay is intended as an aid in the diagnosis of influenza from Nasopharyngeal swab specimens and should not be used as a sole basis for treatment. Nasal washings and aspirates are unacceptable for Xpert Xpress SARS-CoV-2/FLU/RSV testing.  Fact Sheet for Patients: BloggerCourse.com  Fact Sheet for Healthcare Providers: SeriousBroker.it  This test is not yet approved or cleared by the [place] FDA and has been authorized for detection and/or diagnosis of SARS-CoV-2 by FDA under an Emergency Use Authorization (EUA). This EUA will remain in effect (meaning this test can be used) for the duration of the COVID-19 declaration under Section 564(b)(1) of the Act, 21 U.S.C. section 360bbb-3(b)(1), unless the authorization is terminated or revoked.  Performed at [facility], [address] [place], [place] [address]   MRSA Next Gen by PCR, Nasal     Status: None   Collection Time: 10/28/21 10:06 PM   Specimen: Nasal Mucosa; Nasal Swab  Result Value Ref Range Status   MRSA by PCR Next Gen NOT DETECTED NOT DETECTED Final    Comment: (NOTE) The GeneXpert MRSA Assay (FDA approved for NASAL specimens only), is one component of a comprehensive MRSA colonization surveillance program. It is not intended to diagnose MRSA infection nor to guide or monitor treatment for MRSA infections. Test performance is not FDA approved in patients less than 18 years old. Performed at [facility], [address], [place], [place] [address]     Anti-infectives:  Anti-infectives (From admission, onward)    None       Consults:  Treatment Team:  Md, Trauma, MD Bedelia Person, MD    Studies:    Events:  Subjective:    Overnight Issues: none reported  Objective:  Vital signs for last  24 hours: Temp:  [97 F (36.1 C)-98.1 F (36.7 C)] 97.6 F (36.4 C) (10/04 0800) Pulse Rate:  [71-101] 89 (10/04 0900) Resp:  [10-36] 36 (10/04 0900) BP: (102-148)/(44-111) 126/111 (10/04 0900) SpO2:  [90 %-100 %] 99 % (10/04 0900) Weight:  [108.9 kg] 108.9 kg (10/03 1434)  Hemodynamic parameters for last 24 hours:    Intake/Output from previous day: 10/03 0701 - 10/04 0700 In: 1098 [P.O.:30; I.V.:624.7; Blood:343.3; IV Piggyback:100] Out: 500 [Urine:500]  Intake/Output this shift: Total I/O In: 301.5 [I.V.:201.5; IV Piggyback:100] Out: -   Vent settings for last 24 hours:    Physical Exam:  General: alert and no respiratory distress Neuro: alert, oriented, and nonfocal exam Resp: normal work of breathing CVS: rrr GI: abd soft nt, nd Extremities: scds  resolving blisters to left lateral lower leg  Results for orders placed or performed during the hospital encounter of 10/28/21 (from the past 24 hour(s))  Comprehensive metabolic panel     Status: Abnormal   Collection Time: 10/28/21  2:36 PM  Result Value Ref Range   Sodium 138 135 - 145 mmol/L   Potassium 4.8 3.5 - 5.1 mmol/L   Chloride 103 98 - 111 mmol/L   CO2 24 22 - 32 mmol/L   Glucose, Bld 189 (H) 70 - 99 mg/dL   BUN 15 8 - 23 mg/dL   Creatinine, Ser 2.40 (H) 0.61 - 1.24 mg/dL   Calcium 8.9 8.9 - 97.3 mg/dL   Total Protein 6.3 (L) 6.5 - 8.1 g/dL   Albumin 3.5 3.5 - 5.0 g/dL   AST 27 15 - 41 U/L   ALT 19 0 - 44 U/L   Alkaline Phosphatase 61 38 - 126 U/L   Total Bilirubin 1.2 0.3 - 1.2 mg/dL   GFR, Estimated 42 (L) >60 mL/min   Anion gap 11 5 - 15  CBC     Status: Abnormal   Collection Time: 10/28/21  2:36 PM  Result Value Ref Range   WBC 10.6 (H) 4.0 - 10.5 K/uL   RBC 4.23 4.22 - 5.81 MIL/uL   Hemoglobin 13.5 13.0 - 17.0 g/dL   HCT 53.2 99.2 - 42.6 %   MCV 97.4 80.0 - 100.0 fL   MCH 31.9 26.0 - 34.0 pg   MCHC 32.8 30.0 - 36.0 g/dL   RDW 83.4 19.6 - 22.2 %   Platelets 199 150 - 400 K/uL   nRBC 0.0  0.0 - 0.2 %  Ethanol     Status: None   Collection Time: 10/28/21  2:36 PM  Result Value Ref Range   Alcohol, Ethyl (B) <10 <10 mg/dL  Protime-INR     Status: None   Collection Time: 10/28/21  2:36 PM  Result Value Ref Range   Prothrombin Time 14.0 11.4 - 15.2 seconds   INR 1.1 0.8 - 1.2  Resp Panel by RT-PCR (Flu A&B, Covid) Anterior Nasal Swab     Status: None   Collection Time: 10/28/21  2:38 PM   Specimen: Anterior Nasal Swab  Result Value Ref Range   SARS Coronavirus 2 by RT PCR NEGATIVE NEGATIVE   Influenza A by PCR NEGATIVE NEGATIVE   Influenza B by PCR NEGATIVE NEGATIVE  CBG monitoring, ED     Status: Abnormal   Collection Time: 10/28/21  2:53 PM  Result Value Ref Range   Glucose-Capillary 181 (H) 70 - 99 mg/dL  ABO/Rh     Status: None   Collection Time: 10/28/21  3:00 PM  Result Value Ref Range   ABO/RH(D)      O POS Performed at [facility] [address] [place], [place] [address]   I-Stat Chem 8, ED     Status: Abnormal   Collection Time: 10/28/21  3:10 PM  Result Value Ref Range   Sodium 137 135 - 145 mmol/L   Potassium 5.0 3.5 - 5.1 mmol/L   Chloride 99 98 - 111 mmol/L   BUN 20 8 - 23 mg/dL   Creatinine, Ser 1.70 (H) 0.61 - 1.24 mg/dL   Glucose, Bld 181 (H) 70 - 99 mg/dL   Calcium, Ion 1.14 (L) 1.15 - 1.40 mmol/L   TCO2 29 22 - 32 mmol/L   Hemoglobin 14.3 13.0 - 17.0 g/dL   HCT 42.0 39.0 - 52.0 %  Prepare platelet pheresis     Status: None (Preliminary result)   Collection Time: 10/28/21  4:42 PM  Result Value Ref Range   Unit Number [id number]    Blood Component Type PSORALEN TREATED    Unit division 00    Status of Unit ISSUED    Transfusion Status      OK TO TRANSFUSE Performed at [facility], [address] [place], [place] [address]    Unit Number [id number]    Blood Component Type PLTP2 PSORALEN TREATED    Unit division 00    Status of Unit ISSUED    Transfusion Status OK TO TRANSFUSE   Type and screen [place]     Status: None   Collection Time: 10/28/21  5:27 PM  Result Value Ref Range   ABO/RH(D) O POS    Antibody Screen NEG    Sample Expiration      10/31/2021,2359 Performed at [facility], [place] [address] [place], [place] [address]   Lactic acid, plasma     Status: Abnormal   Collection Time: 10/28/21  9:49 PM  Result Value Ref Range   Lactic Acid, Venous 2.9 (HH) 0.5 - 1.9 mmol/L  Hemoglobin A1c     Status: Abnormal   Collection Time: 10/28/21  9:49 PM  Result Value Ref Range   Hgb A1c MFr Bld 6.4 (H) 4.8 - 5.6 %   Mean Plasma Glucose 136.98 mg/dL  Urinalysis, Routine w reflex microscopic Urine, Clean Catch     Status: Abnormal   Collection Time: 10/28/21 10:06 PM  Result Value Ref Range   Color, Urine YELLOW YELLOW   APPearance CLEAR CLEAR   Specific Gravity, Urine 1.015 1.005 - 1.030   pH 7.0 5.0 - 8.0   Glucose, UA 50 (A) NEGATIVE mg/dL   Hgb urine dipstick NEGATIVE NEGATIVE   Bilirubin Urine NEGATIVE NEGATIVE   Ketones, ur 20 (A) NEGATIVE mg/dL   Protein, ur NEGATIVE NEGATIVE mg/dL   Nitrite NEGATIVE NEGATIVE   Leukocytes,Ua NEGATIVE NEGATIVE  MRSA Next Gen by PCR, Nasal     Status: None   Collection Time: 10/28/21 10:06 PM   Specimen: Nasal Mucosa; Nasal Swab  Result Value Ref Range   MRSA by PCR Next Gen NOT DETECTED NOT DETECTED  Glucose, capillary     Status: Abnormal   Collection Time: 10/29/21 12:40 AM  Result Value Ref Range   Glucose-Capillary 175 (H) 70 - 99 mg/dL  Glucose, capillary  Status: Abnormal   Collection Time: 10/29/21  4:06 AM  Result Value Ref Range   Glucose-Capillary 144 (H) 70 - 99 mg/dL  CBC     Status: Abnormal   Collection Time: 10/29/21  4:12 AM  Result Value Ref Range   WBC 9.9 4.0 - 10.5 K/uL   RBC 3.93 (L) 4.22 - 5.81 MIL/uL   Hemoglobin 12.6 (L) 13.0 - 17.0 g/dL   HCT 47.4 (L) 25.9 - 56.3 %   MCV 96.2 80.0 - 100.0 fL   MCH 32.1 26.0 - 34.0 pg   MCHC 33.3 30.0 - 36.0 g/dL   RDW 87.5 64.3 - 32.9 %    Platelets 232 150 - 400 K/uL   nRBC 0.0 0.0 - 0.2 %  Basic metabolic panel     Status: Abnormal   Collection Time: 10/29/21  4:12 AM  Result Value Ref Range   Sodium 136 135 - 145 mmol/L   Potassium 4.9 3.5 - 5.1 mmol/L   Chloride 101 98 - 111 mmol/L   CO2 25 22 - 32 mmol/L   Glucose, Bld 164 (H) 70 - 99 mg/dL   BUN 19 8 - 23 mg/dL   Creatinine, Ser 5.18 (H) 0.61 - 1.24 mg/dL   Calcium 8.7 (L) 8.9 - 10.3 mg/dL   GFR, Estimated 45 (L) >60 mL/min   Anion gap 10 5 - 15  Glucose, capillary     Status: Abnormal   Collection Time: 10/29/21  8:05 AM  Result Value Ref Range   Glucose-Capillary 136 (H) 70 - 99 mg/dL    Assessment & Plan: Present on Admission:  Subdural hematoma (HCC)  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 Maisie Fus whom is following from neurosurgery - rec'd hold ASA and Plavix, DDAVP, F/U CT head 10/4. CT unchanged; Keppra. Awaiting formal note Chronic LE wounds - WOC eval - appreciate assistance HTN - hold home meds as NPO, lopressor PRN DM2 - SSI Cr 1.63, IVF; UOP 500cc overnight, increased MIVF to 100cc/hr; monitor VTE - PAS FEN - FLD; adat Dispo: ICU today   LOS: 1 day   Additional comments:I reviewed the patient's new clinical lab test results. Cbc, bmp, CT head results  Critical Care Total Time*: 45 Minutes  Marin Olp, MD [facility]  10/29/2021  *Care during the described time interval was provided by me. I have reviewed this patient's available data, including medical history, events of note, physical examination and test results as part of my evaluation.

## 2021-10-29 NOTE — Progress Notes (Signed)
Subjective: Patient reports no significant headache  Objective: Vital signs in last 24 hours: Temp:  [97 F (36.1 C)-98.1 F (36.7 C)] 97.6 F (36.4 C) (10/04 0800) Pulse Rate:  [71-101] 89 (10/04 0900) Resp:  [10-36] 36 (10/04 0900) BP: (102-148)/(44-111) 126/111 (10/04 0900) SpO2:  [90 %-100 %] 99 % (10/04 0900) Weight:  [108.9 kg] 108.9 kg (10/03 1434)  Intake/Output from previous day: 10/03 0701 - 10/04 0700 In: 1098 [P.O.:30; I.V.:624.7; Blood:343.3; IV Piggyback:100] Out: 500 [Urine:500] Intake/Output this shift: Total I/O In: 301.5 [I.V.:201.5; IV Piggyback:100] Out: -   Eyes open spontaneously, conversant, slightly confused.  Oriented x2.  Follows commands x4 Pain limited left shoulder abduction.  Lab Results: Recent Labs    10/28/21 1436 10/28/21 1510 10/29/21 0412  WBC 10.6*  --  9.9  HGB 13.5 14.3 12.6*  HCT 41.2 42.0 37.8*  PLT 199  --  232   BMET Recent Labs    10/28/21 1436 10/28/21 1510 10/29/21 0412  NA 138 137 136  K 4.8 5.0 4.9  CL 103 99 101  CO2 24  --  25  GLUCOSE 189* 181* 164*  BUN 15 20 19   CREATININE 1.72* 1.70* 1.63*  CALCIUM 8.9  --  8.7*    Studies/Results: CT HEAD WO CONTRAST (5MM)  Result Date: 10/29/2021 CLINICAL DATA:  Follow-up subdural hematoma EXAM: CT HEAD WITHOUT CONTRAST TECHNIQUE: Contiguous axial images were obtained from the base of the skull through the vertex without intravenous contrast. RADIATION DOSE REDUCTION: This exam was performed according to the departmental dose-optimization program which includes automated exposure control, adjustment of the mA and/or kV according to patient size and/or use of iterative reconstruction technique. COMPARISON:  Yesterday FINDINGS: Brain: Primarily high-density subdural hematoma around the left cerebral convexity, both lateral and inter hemispheric, up to 1 cm in thickness laterally, unchanged. Mild mass effect with 4 mm of midline shift. Chronic small vessel ischemia in the  cerebral white matter. Mixed density subdural hematoma along the right cerebral convexity measuring 3 mm in thickness. No acute gray matter infarct or hydrocephalus. Vascular: No hyperdense vessel or unexpected calcification. Skull: Normal. Negative for fracture or focal lesion. Sinuses/Orbits: Negative IMPRESSION: Unchanged acute left and mixed density right subdural hematomas, on the left measuring up to 1 cm thickness. Electronically Signed   By: Tiburcio PeaJonathan  Watts M.D.   On: 10/29/2021 05:09   CT HEAD WO CONTRAST (5MM)  Result Date: 10/28/2021 CLINICAL DATA:  Head trauma EXAM: CT HEAD WITHOUT CONTRAST TECHNIQUE: Contiguous axial images were obtained from the base of the skull through the vertex without intravenous contrast. RADIATION DOSE REDUCTION: This exam was performed according to the departmental dose-optimization program which includes automated exposure control, adjustment of the mA and/or kV according to patient size and/or use of iterative reconstruction technique. COMPARISON:  10/28/2021 FINDINGS: Brain: Unchanged left hemispheric mixed density subdural hematoma that measures up to 8 mm thick. The para falcine component is also unchanged. Rightward midline shift is unchanged at 5 mm. Unchanged subacute to chronic right convexity small subdural hematoma. Vascular: No hyperdense vessel or unexpected calcification. Skull: Normal. Negative for fracture or focal lesion. Sinuses/Orbits: No acute finding. Other: None. IMPRESSION: Unchanged left hemispheric mixed density subdural hematoma with 5 mm of rightward midline shift. Electronically Signed   By: Deatra RobinsonKevin  Herman M.D.   On: 10/28/2021 21:48   EEG adult  Result Date: 10/28/2021 Charlsie QuestYadav, Priyanka O, MD     10/28/2021  5:21 PM Patient Name: James SideJames F Vasil MRN: [id number] Epilepsy Attending:  Charlsie Quest Referring Physician/Provider: Elmer Picker, NP Date: 10/28/2021 Duration: 24.27 mins Patient history: 71 y.o. male with past medical history of  arthritis, hyperlipidemia, hypertension, neuropathic pain, PAD, psoriasis, stroke, diabetes brought in by EMS as a level 2 trauma after he fell and hit the back of his head while he was at the tire shop with his son.  Last known well 1200 which is when the fall had occurred.  CT head confirmed a left subdural hematoma with a 5 mm rightward midline shift as well as a trace subarachnoid hemorrhage in left sylvian fissure.  There is a current concern for potential seizure activity based on observation "spaced out" while driving. EEG to evaluate for seizure Level of alertness: Awake, drowsy AEDs during EEG study: LEV Technical aspects: This EEG study was done with scalp electrodes positioned according to the 10-20 International system of electrode placement. Electrical activity was reviewed with band pass filter of 1-70Hz , sensitivity of 7 uV/mm, display speed of 17mm/sec with a 60Hz  notched filter applied as appropriate. EEG data were recorded continuously and digitally stored.  Video monitoring was available and reviewed as appropriate. Description: The posterior dominant rhythm consists of 7.5 Hz activity of moderate voltage (25-35 uV) seen predominantly in posterior head regions, symmetric and reactive to eye opening and eye closing. Drowsiness was characterized by attenuation of the posterior background rhythm. Hyperventilation and photic stimulation were not performed.   IMPRESSION: This study is within normal limits. No seizures or epileptiform discharges were seen throughout the recording. A normal interictal EEG does not exclude the diagnosis of epilepsy.   DG Pelvis Portable  Result Date: 10/28/2021 CLINICAL DATA:  Trauma, fall EXAM: PORTABLE PELVIS 1-2 VIEWS COMPARISON:  None Available. FINDINGS: No displaced fracture or dislocation is seen. Degenerative changes are noted in both hips. Degenerative changes are noted in the visualized lower lumbar spine. Arterial calcifications are seen.  There multiple arterial stents in right iliac and femoral vessels in both lower extremity. IMPRESSION: No recent fracture or dislocation is seen. Degenerative changes are noted in both hips and lumbar spine. Significant atherosclerosis. Electronically Signed   By: 12/28/2021 M.D.   On: 10/28/2021 16:46   CT Cervical Spine Wo Contrast  Result Date: 10/28/2021 CLINICAL DATA:  Code stroke. Code stroke with right-sided weakness, fall. EXAM: CT HEAD WITHOUT CONTRAST CT CERVICAL SPINE WITHOUT CONTRAST TECHNIQUE: Multidetector CT imaging of the head and cervical spine was performed following the standard protocol without intravenous contrast. Multiplanar CT image reconstructions of the cervical spine were also generated. RADIATION DOSE REDUCTION: This exam was performed according to the departmental dose-optimization program which includes automated exposure control, adjustment of the mA and/or kV according to patient size and/or use of iterative reconstruction technique. COMPARISON:  CTA neck 03/19/2018 FINDINGS: CT HEAD FINDINGS Brain: There is an acute left cerebral convexity subdural hematoma measuring up to 1.1 cm in maximal thickness. Swirling hypodensity within this hematoma may reflect hyperacute blood/active bleeding. Additional acute subdural blood layering along the left aspect of the falx measures up to 0.6 cm in thickness. There is a smaller right cerebral convexity subdural hematoma which appears more subacute in density measuring up to 3-4 mm in maximal thickness. Is trace subarachnoid hemorrhage in the left sylvian fissure There is mass effect on the underlying brain parenchyma exerted by the left-sided hematoma resulting in sulcal effacement, partial effacement of the left lateral ventricle, and 5 mm rightward midline shift. There is no evidence of acute territorial infarct. Background  parenchymal volume is normal. The ventricles are otherwise normal in size. There is patchy and confluent  hypodensity in the supratentorial white matter likely reflecting sequela of underlying chronic small vessel ischemic change and remote infarcts. There is no solid mass lesion. Vascular: There is calcification of the bilateral carotid siphons. Skull: Normal. Negative for fracture or focal lesion. Sinuses/Orbits: The imaged paranasal sinuses are clear. The globes and orbits are unremarkable. Other: None. CT CERVICAL SPINE FINDINGS Alignment: Normal. There is no jumped or perched facet or other evidence of traumatic malalignment. Skull base and vertebrae: Skull base alignment is maintained. Vertebral body heights are preserved. There is no suspicious osseous lesion. Soft tissues and spinal canal: No prevertebral fluid or swelling. No visible canal hematoma. There is prominent posterior longitudinal ligament thickening with a large protrusion at C3-C4 resulting in moderate to severe spinal canal stenosis with likely cord compression, similar to the CTA neck from 03/19/2021. Disc levels: There is disc space narrowing degenerative endplate change most advanced at C5-C6. There is overall mild-to-moderate multilevel facet arthropathy. As above, there is up to moderate to severe spinal canal stenosis at C3-C4. Upper chest: There is interlobular septal thickening in the lung apices suggesting pulmonary edema. Other: None. IMPRESSION: 1. Acute left cerebral convexity subdural hematoma measuring up to 1.1 cm in thickness with the additional subdural blood layering along the left aspect of the falx resulting in partial effacement of the left lateral ventricle and up to 5 mm rightward midline shift. Swirling hypodensity within this hematoma may reflect hyperacute blood/active bleeding. 2. Smaller more subacute appearing right cerebral convexity subdural hematoma measuring up to 3-4 mm in thickness. 3. Trace subarachnoid hemorrhage in the left sylvian fissure. 4. No acute fracture or traumatic malalignment of the cervical spine.  5. Prominent posterior longitudinal ligament thickening and large protrusion at C3-C4 resulting in moderate to severe spinal canal stenosis with likely cord compression, similar to the CTA neck from 03/19/2018. Consider further evaluation with nonemergent cervical spine MRI as indicated. 6. Interlobular septal thickening in the lung apices suggesting pulmonary edema. Critical Value/emergent results were called by telephone at the time of interpretation on 10/28/2021 at 2:58 pm to provider Dr Roderic Palau, who verbally acknowledged these results. Electronically Signed   By: Valetta Mole M.D.   On: 10/28/2021 15:12   CT HEAD CODE STROKE WO CONTRAST  Result Date: 10/28/2021 CLINICAL DATA:  Code stroke. Code stroke with right-sided weakness, fall. EXAM: CT HEAD WITHOUT CONTRAST CT CERVICAL SPINE WITHOUT CONTRAST TECHNIQUE: Multidetector CT imaging of the head and cervical spine was performed following the standard protocol without intravenous contrast. Multiplanar CT image reconstructions of the cervical spine were also generated. RADIATION DOSE REDUCTION: This exam was performed according to the departmental dose-optimization program which includes automated exposure control, adjustment of the mA and/or kV according to patient size and/or use of iterative reconstruction technique. COMPARISON:  CTA neck 03/19/2018 FINDINGS: CT HEAD FINDINGS Brain: There is an acute left cerebral convexity subdural hematoma measuring up to 1.1 cm in maximal thickness. Swirling hypodensity within this hematoma may reflect hyperacute blood/active bleeding. Additional acute subdural blood layering along the left aspect of the falx measures up to 0.6 cm in thickness. There is a smaller right cerebral convexity subdural hematoma which appears more subacute in density measuring up to 3-4 mm in maximal thickness. Is trace subarachnoid hemorrhage in the left sylvian fissure There is mass effect on the underlying brain parenchyma exerted by the  left-sided hematoma resulting in sulcal effacement, partial effacement  of the left lateral ventricle, and 5 mm rightward midline shift. There is no evidence of acute territorial infarct. Background parenchymal volume is normal. The ventricles are otherwise normal in size. There is patchy and confluent hypodensity in the supratentorial white matter likely reflecting sequela of underlying chronic small vessel ischemic change and remote infarcts. There is no solid mass lesion. Vascular: There is calcification of the bilateral carotid siphons. Skull: Normal. Negative for fracture or focal lesion. Sinuses/Orbits: The imaged paranasal sinuses are clear. The globes and orbits are unremarkable. Other: None. CT CERVICAL SPINE FINDINGS Alignment: Normal. There is no jumped or perched facet or other evidence of traumatic malalignment. Skull base and vertebrae: Skull base alignment is maintained. Vertebral body heights are preserved. There is no suspicious osseous lesion. Soft tissues and spinal canal: No prevertebral fluid or swelling. No visible canal hematoma. There is prominent posterior longitudinal ligament thickening with a large protrusion at C3-C4 resulting in moderate to severe spinal canal stenosis with likely cord compression, similar to the CTA neck from 03/19/2021. Disc levels: There is disc space narrowing degenerative endplate change most advanced at C5-C6. There is overall mild-to-moderate multilevel facet arthropathy. As above, there is up to moderate to severe spinal canal stenosis at C3-C4. Upper chest: There is interlobular septal thickening in the lung apices suggesting pulmonary edema. Other: None. IMPRESSION: 1. Acute left cerebral convexity subdural hematoma measuring up to 1.1 cm in thickness with the additional subdural blood layering along the left aspect of the falx resulting in partial effacement of the left lateral ventricle and up to 5 mm rightward midline shift. Swirling hypodensity within this  hematoma may reflect hyperacute blood/active bleeding. 2. Smaller more subacute appearing right cerebral convexity subdural hematoma measuring up to 3-4 mm in thickness. 3. Trace subarachnoid hemorrhage in the left sylvian fissure. 4. No acute fracture or traumatic malalignment of the cervical spine. 5. Prominent posterior longitudinal ligament thickening and large protrusion at C3-C4 resulting in moderate to severe spinal canal stenosis with likely cord compression, similar to the CTA neck from 03/19/2018. Consider further evaluation with nonemergent cervical spine MRI as indicated. 6. Interlobular septal thickening in the lung apices suggesting pulmonary edema. Critical Value/emergent results were called by telephone at the time of interpretation on 10/28/2021 at 2:58 pm to provider Dr Estell Harpin, who verbally acknowledged these results. Electronically Signed   By: Lesia Hausen M.D.   On: 10/28/2021 15:12   DG Chest Port 1 View  Result Date: 10/28/2021 CLINICAL DATA:  Fall in a 71 year old male. EXAM: PORTABLE CHEST 1 VIEW COMPARISON:  None available FINDINGS: EKG leads project over the chest. Cardiomediastinal contours and hilar structures are normal. Lungs are clear. Assessment limited by portable technique and patient body habitus. No visible pneumothorax. On limited assessment no acute skeletal findings. IMPRESSION: No acute cardiopulmonary disease. Assessment limited by portable technique and patient body habitus. Electronically Signed   By: Donzetta Kohut M.D.   On: 10/28/2021 14:52    Assessment/Plan: This is a 71 year old man with multiple medical core morbidities including CHF who was on aspirin and Plavix and suffered head trauma.  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  His neurologic exam is stable. -He will need Keppra for 7 days -He will need a follow-up CT head in 2 weeks to assess for evolution of chronic subdural hematoma -He was not able to  tolerate MRI due to claustrophobia, but it does not appear that MRI would change his  treatment plan. -It appears reasonably safe to start pharmacologic DVT prophylaxis -He will need to continue to mobilize with physical therapy.  He is fairly deconditioned so it is unclear if SNF or rehab would be most appropriate for ultimate disposition.  All questions and concerns were answered.  I discussed the plan of care with the patient's daughter who was at the bedside.   Bedelia Person 10/29/2021, 1:14 PM

## 2021-10-29 NOTE — Progress Notes (Signed)
Inpatient Rehabilitation Admissions Coordinator   I met at bedside with son and daughter. Patient asleep. I discussed goals and expectations of a possible Cir admit. They state they were recently pursuing SNF rehab but insurance denied. They cannot provide 24/7 assist after a CIR admit and I do not feel he can reach Mod I level in a Cir length of stay. They would like to pursue SNF at [facility] where their Elenor Legato is an Scientist, physiological. I will alert acute team and TOC. We will sign off.  Danne Baxter, RN, MSN Rehab Admissions Coordinator [phone] 10/29/2021 3:00 PM

## 2021-10-29 NOTE — Progress Notes (Signed)
Carotid duplex has been completed.   Preliminary results in CV Proc.   James Dougherty 10/29/2021 2:29 PM

## 2021-10-29 NOTE — Progress Notes (Signed)
? ?  Inpatient Rehab Admissions Coordinator : ? ?Per therapy recommendations, patient was screened for CIR candidacy by Kahle Mcqueen RN MSN.  At this time patient appears to be a potential candidate for CIR. I will place a rehab consult per protocol for full assessment. Please call me with any questions. ? ?Henson Fraticelli RN MSN ?Admissions Coordinator ?[phone] ?  ?

## 2021-10-29 NOTE — Progress Notes (Signed)
Patient taken to CT #3 for CT head w/out with this nurse. Returned to 9R41 without complication.

## 2021-10-29 NOTE — Progress Notes (Deleted)
Clinical Summary Mr. Grassel is a 71 y.o.male   Past Medical History:  Diagnosis Date   Arthritis    "hands" (12/27/2012)   High cholesterol    Hypertension    Neuropathic pain    PAD (peripheral artery disease) ([place])    Psoriasis    Stroke ([place]) 1999   "still have some speech problems at times; sometimes forget what I was going to say" (12/27/2012)   Type II diabetes mellitus (HCC)    Ulcer    Left ankle/leg     Allergies  Allergen Reactions   Oxycodone Nausea And Vomiting   Glipizide     Bad headache and blurred vision   Bactrim [Sulfamethoxazole-Trimethoprim] Itching and Rash     No current facility-administered medications for this visit.   No current outpatient medications on file.   Facility-Administered Medications Ordered in Other Visits  Medication Dose Route Frequency Provider Last Rate Last Admin   0.9 %  sodium chloride infusion (Manually program via Guardrails IV Fluids)   Intravenous Once Fenton Malling L, NP       0.9 %  sodium chloride infusion   Intravenous Continuous Ileana Roup, MD 100 mL/hr at 10/29/21 1236 New Bag at 10/29/21 1236   Chlorhexidine Gluconate Cloth 2 % PADS 6 each  6 each Topical Q0600 Georganna Skeans, MD   6 each at 10/28/21 2130   clevidipine (CLEVIPREX) infusion 0.5 mg/mL  0-21 mg/hr Intravenous Continuous Georganna Skeans, MD   Held at 10/28/21 1514   fentaNYL (SUBLIMAZE) injection 50 mcg  50 mcg Intravenous Q1H PRN Georganna Skeans, MD       gabapentin (NEURONTIN) capsule 600 mg  600 mg Oral QHS Stechschulte, Nickola Major, MD   600 mg at 10/28/21 2308   insulin aspart (novoLOG) injection 0-15 Units  0-15 Units Subcutaneous Q4H Georganna Skeans, MD   2 Units at 10/29/21 0845   labetalol (NORMODYNE) injection 20 mg  20 mg Intravenous Q2H PRN Georganna Skeans, MD       levETIRAcetam (KEPPRA) IVPB 500 mg/100 mL premix  500 mg Intravenous Q12H Georganna Skeans, MD   Stopped at 10/29/21 0949   metoprolol tartrate (LOPRESSOR)  injection 5 mg  5 mg Intravenous Q6H PRN Georganna Skeans, MD       morphine (PF) 2 MG/ML injection 2 mg  2 mg Intravenous Q1H PRN Georganna Skeans, MD       morphine (PF) 4 MG/ML injection 4 mg  4 mg Intravenous Q1H PRN Georganna Skeans, MD       ondansetron [facility]) injection 4 mg  4 mg Intravenous Q6H PRN Rosalin Hawking, MD       Oral care mouth rinse  15 mL Mouth Rinse PRN Georganna Skeans, MD       pantoprazole (PROTONIX) EC tablet 40 mg  40 mg Oral Daily Georganna Skeans, MD   40 mg at 10/29/21 1583   Or   pantoprazole (PROTONIX) injection 40 mg  40 mg Intravenous Daily Georganna Skeans, MD   40 mg at 10/28/21 2201     Past Surgical History:  Procedure Laterality Date   ABDOMINAL AORTAGRAM N/A 03/30/2012   Procedure: ABDOMINAL Maxcine Ham;  Surgeon: Serafina Mitchell, MD;  Location: [facility] CATH LAB;  Service: Cardiovascular;  Laterality: N/A;   ABDOMINAL AORTAGRAM N/A 12/27/2012   Procedure: ABDOMINAL Maxcine Ham;  Surgeon: Serafina Mitchell, MD;  Location: [facility] CATH LAB;  Service: Cardiovascular;  Laterality: N/A;   ABDOMINAL AORTOGRAM W/LOWER EXTREMITY N/A 04/05/2017  Procedure: ABDOMINAL AORTOGRAM W/LOWER EXTREMITY;  Surgeon: Sherren Kerns, MD;  Location: [facility] INVASIVE CV LAB;  Service: Cardiovascular;  Laterality: N/A;   AMPUTATION Right 04/26/2017   Procedure: AMPUTATION TRANSMETATARSAL RIGHT GREAT TOE;  Surgeon: Larina Earthly, MD;  Location: [facility] OR;  Service: Vascular;  Laterality: Right;   ANGIOPLASTY / STENTING FEMORAL Left 12/27/2012   APPLICATION OF WOUND VAC Right 04/26/2017   Procedure: APPLICATION OF WOUND VAC;  Surgeon: Larina Earthly, MD;  Location: [facility] OR;  Service: Vascular;  Laterality: Right;   FEMORAL ARTERY STENT  03/30/2012   LOWER EXTREMITY ANGIOGRAM  12/27/2012   Procedure: LOWER EXTREMITY ANGIOGRAM;  Surgeon: Nada Libman, MD;  Location[facility] CATH LAB;  Service: Cardiovascular;;   LOWER EXTREMITY ANGIOGRAPHY N/A 04/19/2018   Procedure: LOWER EXTREMITY ANGIOGRAPHY;  Surgeon: Nada Libman, MD;  Location: [facility] INVASIVE CV LAB;  Service: Cardiovascular;  Laterality: N/A;   PERIPHERAL VASCULAR ATHERECTOMY Right 04/05/2017   Procedure: PERIPHERAL VASCULAR ATHERECTOMY;  Surgeon: Sherren Kerns, MD;  Location: [facility] INVASIVE CV LAB;  Service: Cardiovascular;  Laterality: Right;  superficial femoral   PERIPHERAL VASCULAR BALLOON ANGIOPLASTY  04/19/2018   Procedure: PERIPHERAL VASCULAR BALLOON ANGIOPLASTY;  Surgeon: Nada Libman, MD;  Location: [facility] INVASIVE CV LAB;  Service: Cardiovascular;;   PERIPHERAL VASCULAR INTERVENTION Right 04/05/2017   Procedure: PERIPHERAL VASCULAR INTERVENTION;  Surgeon: Sherren Kerns, MD;  Location: [facility] INVASIVE CV LAB;  Service: Cardiovascular;  Laterality: Right;   Superficial femorl and external iliac   POPLITEAL ARTERY STENT     TONSILLECTOMY AND ADENOIDECTOMY       Allergies  Allergen Reactions   Oxycodone Nausea And Vomiting   Glipizide     Bad headache and blurred vision   Bactrim [Sulfamethoxazole-Trimethoprim] Itching and Rash      Family History  Problem Relation Age of Onset   Diabetes Sister        Bilateral amputation of lower legs   Diabetes Sister    Heart disease Sister 42       Heart disease before age 50   Hypertension Sister    Heart attack Sister    Hyperlipidemia Sister    Hypertension Father    Hyperlipidemia Father    Hyperlipidemia Mother    Hypertension Mother    Hypertension Daughter      Social History Mr. Slayden reports that he quit smoking about 10 years ago. His smoking use included cigarettes. He has a 135.00 pack-year smoking history. He has never been exposed to tobacco smoke. He has never used smokeless tobacco. Mr. Bochenek reports that he does not currently use alcohol.   Review of Systems CONSTITUTIONAL: No weight loss, fever, chills, weakness or fatigue.  HEENT: Eyes: No visual loss, blurred vision, double vision or yellow sclerae.No hearing loss, sneezing, congestion, runny nose or sore  throat.  SKIN: No rash or itching.  CARDIOVASCULAR:  RESPIRATORY: No shortness of breath, cough or sputum.  GASTROINTESTINAL: No anorexia, nausea, vomiting or diarrhea. No abdominal pain or blood.  GENITOURINARY: No burning on urination, no polyuria NEUROLOGICAL: No headache, dizziness, syncope, paralysis, ataxia, numbness or tingling in the extremities. No change in bowel or bladder control.  MUSCULOSKELETAL: No muscle, back pain, joint pain or stiffness.  LYMPHATICS: No enlarged nodes. No history of splenectomy.  PSYCHIATRIC: No history of depression or anxiety.  ENDOCRINOLOGIC: No reports of sweating, cold or heat intolerance. No polyuria or polydipsia.  [name]   Physical Examination There were no vitals filed for this  visit. There were no vitals filed for this visit.  Gen: resting comfortably, no acute distress HEENT: no scleral icterus, pupils equal round and reactive, no palptable cervical adenopathy,  CV Resp: Clear to auscultation bilaterally GI: abdomen is soft, non-tender, non-distended, normal bowel sounds, no hepatosplenomegaly MSK: extremities are warm, no edema.  Skin: warm, no rash Neuro:  no focal deficits Psych: appropriate affect   Diagnostic Studies     Assessment and Plan        Antoine Poche, M.D., F.A.C.C.

## 2021-10-29 NOTE — Progress Notes (Signed)
Pt seen and examined on 10/28/21 at 4:15 pm.  Drowsy, opens eyes to voice, PERRL, FC x 4.  Fall with head trauma on Aspirin and Plavix, medium sized left SDH and falcine SDH.  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   Recommend ICU admission, repeat CT head in am, Keppra x 7 days.  Ok to start lovenox for dvt ppx if head CT stable.

## 2021-10-29 NOTE — Evaluation (Signed)
Physical Therapy Evaluation Patient Details Name: James Dougherty MRN: [id number] DOB: [birth year] Today's Date: 10/29/2021  History of Present Illness  71 y.o. male brought in by EMS on 10/3 as a level 2 trauma after he fell and hit the back of his head while he was at the tire shop with his son. Pt presenting with AMS, R-sided weakness, and slurred speech. CT head + left subdural hematoma as well as a trace subarachnoid hemorrhage in left sylvian fissure. Past medical history of arthritis, hyperlipidemia, hypertension, neuropathic pain, PAD, psoriasis, stroke, and diabetes.   Clinical Impression  Pt in bed upon arrival of PT, agreeable to evaluation at this time. Prior to admission the pt was mobilizing with use of his RW, driving, and living alone in a house where he reports he is independent with ADLs and IADLs. The pt also reports second fall on 9/29 without head trauma, but was unable to get up from floor without calling family for assist. The pt required min-modA to complete bed mobility and sit-stand transfers, but is most limited in dynamic stability, problem solving, and awareness of deficits at this time. The pt had multiple posterior LOB with movement in room and bathroom, ran into multiple objects and doorways on L side needing cues/assist to correct, and had onset of nausea with head movement when walking requiring seated rest and cessation of activity. Will also plan for vestibular evaluation to rule this out as additional factor in falls and pt sx.   Given recent increase in falls, high risk of continued falls, and pt's prior level of independence, recommend acute inpatient rehab when medically stable for d/c to maximize safety after d/c and reduce risk of continued falls.         Recommendations for follow up therapy are one component of a multi-disciplinary discharge planning process, led by the attending physician.  Recommendations may be updated based on patient status, additional  functional criteria and insurance authorization.  Follow Up Recommendations Acute inpatient rehab (3hours/day)      Assistance Recommended at Discharge Frequent or constant Supervision/Assistance  Patient can return home with the following  A little help with walking and/or transfers;A little help with bathing/dressing/bathroom;Assistance with cooking/housework;Assistance with feeding;Direct supervision/assist for medications management;Direct supervision/assist for financial management;Assist for transportation;Help with stairs or ramp for entrance    Equipment Recommendations  (defer to post acute)  Recommendations for Other Services  Rehab consult    Functional Status Assessment Patient has had a recent decline in their functional status and demonstrates the ability to make significant improvements in function in a reasonable and predictable amount of time.     Precautions / Restrictions Precautions Precautions: Fall Precaution Comments: x2 recent falls Restrictions Weight Bearing Restrictions: No      Mobility  Bed Mobility Overal bed mobility: Needs Assistance Bed Mobility: Supine to Sit, Sit to Supine     Supine to sit: Min assist, Mod assist, HOB elevated Sit to supine: Mod assist   General bed mobility comments: min-modA to elevate trunk after increased time and effort to move LE and reposition with heavy use of bed rails. pt needing assist to return to supine    Transfers Overall transfer level: Needs assistance Equipment used: Rolling walker (2 wheels) Transfers: Sit to/from Stand Sit to Stand: Min assist           General transfer comment: minA to rise and steady. pt needing cues to hold RW appropriately    Ambulation/Gait Ambulation/Gait assistance: Min assist, Mod assist  Gait Distance (Feet): 100 Feet Assistive device: Rolling walker (2 wheels) Gait Pattern/deviations: Step-through pattern, Trunk flexed, Shuffle, Decreased dorsiflexion - right,  Decreased dorsiflexion - left, Decreased stride length Gait velocity: decreased Gait velocity interpretation: <1.31 ft/sec, indicative of household ambulator   General Gait Details: pt needing cues for positioning in RW, posture, and progressively more assist with fatigue. able to follow two-step commands. onset of nausea and dry heaving with horizontal head turns   Modified Rankin (Stroke Patients Only) Modified Rankin (Stroke Patients Only) Pre-Morbid Rankin Score: No significant disability Modified Rankin: Moderately severe disability     Balance Overall balance assessment: Needs assistance, History of Falls (at least 2 falls in last week) Sitting-balance support: No upper extremity supported Sitting balance-Leahy Scale: Fair Sitting balance - Comments: able to lean outside of BOS for LB dressing Postural control: Posterior lean Standing balance support: Single extremity supported, Bilateral upper extremity supported, During functional activity Standing balance-Leahy Scale: Fair Standing balance comment: can static stand with single UE support, BUE support for mobility, frequent posterior LOB with distraction or any backwards momentum             High level balance activites: Turns, Head turns High Level Balance Comments: onset of nausea and dry heaving with horizontal head turns             Pertinent Vitals/Pain Pain Assessment Pain Assessment: No/denies pain    Home Living Family/patient expects to be discharged to:: Private residence Living Arrangements: Alone Available Help at Discharge: Family Type of Home: House Home Access: Stairs to enter Entrance Stairs-Rails:  (yes) Entrance Stairs-Number of Steps: 1   Home Layout: One level Home Equipment: Conservation officer, nature (2 wheels);Shower seat;Grab bars - tub/shower      Prior Function Prior Level of Function : Driving;Independent/Modified Independent (states he was having HHOT/PT)             Mobility  Comments: used RW       Hand Dominance   Dominant Hand: Right    Extremity/Trunk Assessment   Upper Extremity Assessment Upper Extremity Assessment: Defer to OT evaluation    Lower Extremity Assessment Lower Extremity Assessment: Generalized weakness    Cervical / Trunk Assessment Cervical / Trunk Assessment: Kyphotic  Communication   Communication: HOH (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 at times needing repetition likely due to [facility]. However, pt with impaired awareness, running into multiple opbjects on L side with increased time, effort, and cues to make adjustments. Only identifying problems after they start then needing cues for problem solving. Poor awareness of deficits as pt planning to return home        General Comments General comments (skin integrity, edema, etc.): VSS through session with HR 79-99bpm and SpO2 in 90s on RA. onset of nausea and dry heaving with horizontal head turns    Exercises     Assessment/Plan    PT Assessment Patient needs continued PT services  PT Problem List Decreased strength;Decreased range of motion;Decreased activity tolerance;Decreased  balance;Decreased mobility;Decreased coordination;Decreased cognition;Decreased safety awareness       PT Treatment Interventions DME instruction;Gait training;Stair training;Functional mobility  training;Therapeutic activities;Therapeutic exercise;Balance training;Neuromuscular re-education;Patient/family education    PT Goals (Current goals can be found in the Care Plan section)  Acute Rehab PT Goals Patient Stated Goal: return home and get HHPT PT Goal Formulation: With patient Time For Goal Achievement: 11/12/21 Potential to Achieve Goals: Good    Frequency Min 4X/week     Co-evaluation PT/OT/SLP Co-Evaluation/Treatment: Yes Reason for Co-Treatment: Complexity of the patient's impairments (multi-system involvement);Necessary to address cognition/behavior during functional activity;For patient/therapist safety;To address functional/ADL transfers PT goals addressed during session: Mobility/safety with mobility;Balance;Strengthening/ROM OT goals addressed during session: ADL's and self-care;Strengthening/ROM       AM-PAC PT "6 Clicks" Mobility  Outcome Measure Help needed turning from your back to your side while in a flat bed without using bedrails?: A Little Help needed moving from lying on your back to sitting on the side of a flat bed without using bedrails?: A Lot Help needed moving to and from a bed to a chair (including a wheelchair)?: A Lot Help needed standing up from a chair using your arms (e.g., wheelchair or bedside chair)?: A Little Help needed to walk in hospital room?: A Lot Help needed climbing 3-5 steps with a railing? : A Lot 6 Click Score: 14    End of Session Equipment Utilized During Treatment: Gait belt Activity Tolerance: Other (comment) (nausea) Patient left: in bed;with call bell/phone within reach;with bed alarm set Nurse Communication: Mobility status;Other (comment) (nausea, loss of condom cath) PT Visit Diagnosis: Unsteadiness on feet (R26.81);Other abnormalities of gait and mobility (R26.89);Repeated falls (R29.6);Hemiplegia and hemiparesis Hemiplegia - Right/Left: Right Hemiplegia - dominant/non-dominant: Dominant Hemiplegia - caused by:   (traumatic SDH)    Time: 0832-0927 PT Time Calculation (min) (ACUTE ONLY): 55 min   Charges:   PT Evaluation $PT Eval Moderate Complexity: 1 Mod PT Treatments $Gait Training: 8-22 mins        West Carbo, PT, DPT   Acute Rehabilitation Department  Sandra Cockayne 10/29/2021, 9:47 AM

## 2021-10-29 NOTE — Progress Notes (Addendum)
   10/29/21 0116  Vitals  Vital Signs Type (Include Temp, Pulse, RR, and B/P) Blood transfusion completion (within 30 minutes)  Temp 97.8 F (36.6 C)  Temp Source Axillary  Pulse Rate 98  ECG Heart Rate 99  Resp 18  BP 116/63  Oxygen Therapy  SpO2 96 %  O2 Device Room Air  Transfuse platelet pheresis  Volume 32   2nd unit of platelets complete. VSS  0150 - Dr. Leonel Ramsay notified of SBP in the 100-120 range since admission and is okay with these findings.

## 2021-10-29 NOTE — Progress Notes (Addendum)
STROKE TEAM PROGRESS NOTE   SUBJECTIVE (INTERVAL HISTORY) His RN is at the bedside.  Overall his condition is rapidly improving. He walked with PT/OT in the hallway earlier today, when moving head from side to side, he got nauseated and required zofran. Otherwise, still has dysarthria but right leg weakness nearly resolved.    OBJECTIVE Temp:  [97 F (36.1 C)-98.1 F (36.7 C)] 97.6 F (36.4 C) (10/04 0800) Pulse Rate:  [71-101] 89 (10/04 0900) Cardiac Rhythm: Normal sinus rhythm (10/04 0800) Resp:  [10-36] 36 (10/04 0900) BP: (102-148)/(44-111) 126/111 (10/04 0900) SpO2:  [90 %-100 %] 99 % (10/04 0900) Weight:  [108.9 kg] 108.9 kg (10/03 1434)  Recent Labs  Lab 10/28/21 1453 10/29/21 0040 10/29/21 0406 10/29/21 0805  GLUCAP 181* 175* 144* 136*   Recent Labs  Lab 10/28/21 1436 10/28/21 1510 10/29/21 0412  NA 138 137 136  K 4.8 5.0 4.9  CL 103 99 101  CO2 24  --  25  GLUCOSE 189* 181* 164*  BUN 15 20 19   CREATININE 1.72* 1.70* 1.63*  CALCIUM 8.9  --  8.7*   Recent Labs  Lab 10/28/21 1436  AST 27  ALT 19  ALKPHOS 61  BILITOT 1.2  PROT 6.3*  ALBUMIN 3.5   Recent Labs  Lab 10/28/21 1436 10/28/21 1510 10/29/21 0412  WBC 10.6*  --  9.9  HGB 13.5 14.3 12.6*  HCT 41.2 42.0 37.8*  MCV 97.4  --  96.2  PLT 199  --  232   No results for input(s): "CKTOTAL", "CKMB", "CKMBINDEX", "TROPONINI" in the last 168 hours. Recent Labs    10/28/21 1436  LABPROT 14.0  INR 1.1   Recent Labs    10/28/21 2206  COLORURINE YELLOW  LABSPEC 1.015  PHURINE 7.0  GLUCOSEU 50*  HGBUR NEGATIVE  BILIRUBINUR NEGATIVE  KETONESUR 20*  PROTEINUR NEGATIVE  NITRITE NEGATIVE  LEUKOCYTESUR NEGATIVE    No results found for: "CHOL", "TRIG", "HDL", "CHOLHDL", "VLDL", "LDLCALC" Lab Results  Component Value Date   HGBA1C 6.4 (H) 10/28/2021   No results found for: "LABOPIA", "COCAINSCRNUR", "LABBENZ", "AMPHETMU", "THCU", "LABBARB"  Recent Labs  Lab 10/28/21 [place] <10    I  have personally reviewed the radiological images below and agree with the radiology interpretations.  CT HEAD WO CONTRAST (5MM)  Result Date: 10/29/2021 CLINICAL DATA:  Follow-up subdural hematoma EXAM: CT HEAD WITHOUT CONTRAST TECHNIQUE: Contiguous axial images were obtained from the base of the skull through the vertex without intravenous contrast. RADIATION DOSE REDUCTION: This exam was performed according to the departmental dose-optimization program which includes automated exposure control, adjustment of the mA and/or kV according to patient size and/or use of iterative reconstruction technique. COMPARISON:  Yesterday FINDINGS: Brain: Primarily high-density subdural hematoma around the left cerebral convexity, both lateral and inter hemispheric, up to 1 cm in thickness laterally, unchanged. Mild mass effect with 4 mm of midline shift. Chronic small vessel ischemia in the cerebral white matter. Mixed density subdural hematoma along the right cerebral convexity measuring 3 mm in thickness. No acute gray matter infarct or hydrocephalus. Vascular: No hyperdense vessel or unexpected calcification. Skull: Normal. Negative for fracture or focal lesion. Sinuses/Orbits: Negative IMPRESSION: Unchanged acute left and mixed density right subdural hematomas, on the left measuring up to 1 cm thickness. Electronically Signed   By: Jorje Guild M.D.   On: 10/29/2021 05:09   CT HEAD WO CONTRAST (5MM)  Result Date: 10/28/2021 CLINICAL DATA:  Head trauma EXAM: CT HEAD WITHOUT  CONTRAST TECHNIQUE: Contiguous axial images were obtained from the base of the skull through the vertex without intravenous contrast. RADIATION DOSE REDUCTION: This exam was performed according to the departmental dose-optimization program which includes automated exposure control, adjustment of the mA and/or kV according to patient size and/or use of iterative reconstruction technique. COMPARISON:  10/28/2021 FINDINGS: Brain: Unchanged left  hemispheric mixed density subdural hematoma that measures up to 8 mm thick. The para falcine component is also unchanged. Rightward midline shift is unchanged at 5 mm. Unchanged subacute to chronic right convexity small subdural hematoma. Vascular: No hyperdense vessel or unexpected calcification. Skull: Normal. Negative for fracture or focal lesion. Sinuses/Orbits: No acute finding. Other: None. IMPRESSION: Unchanged left hemispheric mixed density subdural hematoma with 5 mm of rightward midline shift. Electronically Signed   By: Ulyses Jarred M.D.   On: 10/28/2021 21:48   EEG adult  Result Date: 10/28/2021 Lora Havens, MD     10/28/2021  5:21 PM Patient Name: James Dougherty MRN: [id number] Epilepsy Attending: Lora Havens Referring Physician/Provider: Janine Ores, NP Date: 10/28/2021 Duration: 24.27 mins Patient history: 71 y.o. male with past medical history of arthritis, hyperlipidemia, hypertension, neuropathic pain, PAD, psoriasis, stroke, diabetes brought in by EMS as a level 2 trauma after he fell and hit the back of his head while he was at the tire shop with his son.  Last known well 1200 which is when the fall had occurred.  CT head confirmed a left subdural hematoma with a 5 mm rightward midline shift as well as a trace subarachnoid hemorrhage in left sylvian fissure.  There is a current concern for potential seizure activity based on observation "spaced out" while driving. EEG to evaluate for seizure Level of alertness: Awake, drowsy AEDs during EEG study: LEV Technical aspects: This EEG study was done with scalp electrodes positioned according to the 10-20 International system of electrode placement. Electrical activity was reviewed with band pass filter of 1-70Hz , sensitivity of 7 uV/mm, display speed of 35mm/sec with a 60Hz  notched filter applied as appropriate. EEG data were recorded continuously and digitally stored.  Video monitoring was available and reviewed as appropriate.  Description: The posterior dominant rhythm consists of 7.5 Hz activity of moderate voltage (25-35 uV) seen predominantly in posterior head regions, symmetric and reactive to eye opening and eye closing. Drowsiness was characterized by attenuation of the posterior background rhythm. Hyperventilation and photic stimulation were not performed.   IMPRESSION: This study is within normal limits. No seizures or epileptiform discharges were seen throughout the recording. A normal interictal EEG does not exclude the diagnosis of epilepsy. Lora Havens   DG Pelvis Portable  Result Date: 10/28/2021 CLINICAL DATA:  Trauma, fall EXAM: PORTABLE PELVIS 1-2 VIEWS COMPARISON:  None Available. FINDINGS: No displaced fracture or dislocation is seen. Degenerative changes are noted in both hips. Degenerative changes are noted in the visualized lower lumbar spine. Arterial calcifications are seen. There multiple arterial stents in right iliac and femoral vessels in both lower extremity. IMPRESSION: No recent fracture or dislocation is seen. Degenerative changes are noted in both hips and lumbar spine. Significant atherosclerosis. Electronically Signed   By: Elmer Picker M.D.   On: 10/28/2021 16:46   CT Cervical Spine Wo Contrast  Result Date: 10/28/2021 CLINICAL DATA:  Code stroke. Code stroke with right-sided weakness, fall. EXAM: CT HEAD WITHOUT CONTRAST CT CERVICAL SPINE WITHOUT CONTRAST TECHNIQUE: Multidetector CT imaging of the head and cervical spine was performed following the standard protocol without  intravenous contrast. Multiplanar CT image reconstructions of the cervical spine were also generated. RADIATION DOSE REDUCTION: This exam was performed according to the departmental dose-optimization program which includes automated exposure control, adjustment of the mA and/or kV according to patient size and/or use of iterative reconstruction technique. COMPARISON:  CTA neck 03/19/2018 FINDINGS: CT HEAD FINDINGS  Brain: There is an acute left cerebral convexity subdural hematoma measuring up to 1.1 cm in maximal thickness. Swirling hypodensity within this hematoma may reflect hyperacute blood/active bleeding. Additional acute subdural blood layering along the left aspect of the falx measures up to 0.6 cm in thickness. There is a smaller right cerebral convexity subdural hematoma which appears more subacute in density measuring up to 3-4 mm in maximal thickness. Is trace subarachnoid hemorrhage in the left sylvian fissure There is mass effect on the underlying brain parenchyma exerted by the left-sided hematoma resulting in sulcal effacement, partial effacement of the left lateral ventricle, and 5 mm rightward midline shift. There is no evidence of acute territorial infarct. Background parenchymal volume is normal. The ventricles are otherwise normal in size. There is patchy and confluent hypodensity in the supratentorial white matter likely reflecting sequela of underlying chronic small vessel ischemic change and remote infarcts. There is no solid mass lesion. Vascular: There is calcification of the bilateral carotid siphons. Skull: Normal. Negative for fracture or focal lesion. Sinuses/Orbits: The imaged paranasal sinuses are clear. The globes and orbits are unremarkable. Other: None. CT CERVICAL SPINE FINDINGS Alignment: Normal. There is no jumped or perched facet or other evidence of traumatic malalignment. Skull base and vertebrae: Skull base alignment is maintained. Vertebral body heights are preserved. There is no suspicious osseous lesion. Soft tissues and spinal canal: No prevertebral fluid or swelling. No visible canal hematoma. There is prominent posterior longitudinal ligament thickening with a large protrusion at C3-C4 resulting in moderate to severe spinal canal stenosis with likely cord compression, similar to the CTA neck from 03/19/2021. Disc levels: There is disc space narrowing degenerative endplate change  most advanced at C5-C6. There is overall mild-to-moderate multilevel facet arthropathy. As above, there is up to moderate to severe spinal canal stenosis at C3-C4. Upper chest: There is interlobular septal thickening in the lung apices suggesting pulmonary edema. Other: None. IMPRESSION: 1. Acute left cerebral convexity subdural hematoma measuring up to 1.1 cm in thickness with the additional subdural blood layering along the left aspect of the falx resulting in partial effacement of the left lateral ventricle and up to 5 mm rightward midline shift. Swirling hypodensity within this hematoma may reflect hyperacute blood/active bleeding. 2. Smaller more subacute appearing right cerebral convexity subdural hematoma measuring up to 3-4 mm in thickness. 3. Trace subarachnoid hemorrhage in the left sylvian fissure. 4. No acute fracture or traumatic malalignment of the cervical spine. 5. Prominent posterior longitudinal ligament thickening and large protrusion at C3-C4 resulting in moderate to severe spinal canal stenosis with likely cord compression, similar to the CTA neck from 03/19/2018. Consider further evaluation with nonemergent cervical spine MRI as indicated. 6. Interlobular septal thickening in the lung apices suggesting pulmonary edema. Critical Value/emergent results were called by telephone at the time of interpretation on 10/28/2021 at 2:58 pm to provider Dr Roderic Palau, who verbally acknowledged these results. Electronically Signed   By: Valetta Mole M.D.   On: 10/28/2021 15:12   CT HEAD CODE STROKE WO CONTRAST  Result Date: 10/28/2021 CLINICAL DATA:  Code stroke. Code stroke with right-sided weakness, fall. EXAM: CT HEAD WITHOUT CONTRAST CT CERVICAL SPINE  WITHOUT CONTRAST TECHNIQUE: Multidetector CT imaging of the head and cervical spine was performed following the standard protocol without intravenous contrast. Multiplanar CT image reconstructions of the cervical spine were also generated. RADIATION DOSE  REDUCTION: This exam was performed according to the departmental dose-optimization program which includes automated exposure control, adjustment of the mA and/or kV according to patient size and/or use of iterative reconstruction technique. COMPARISON:  CTA neck 03/19/2018 FINDINGS: CT HEAD FINDINGS Brain: There is an acute left cerebral convexity subdural hematoma measuring up to 1.1 cm in maximal thickness. Swirling hypodensity within this hematoma may reflect hyperacute blood/active bleeding. Additional acute subdural blood layering along the left aspect of the falx measures up to 0.6 cm in thickness. There is a smaller right cerebral convexity subdural hematoma which appears more subacute in density measuring up to 3-4 mm in maximal thickness. Is trace subarachnoid hemorrhage in the left sylvian fissure There is mass effect on the underlying brain parenchyma exerted by the left-sided hematoma resulting in sulcal effacement, partial effacement of the left lateral ventricle, and 5 mm rightward midline shift. There is no evidence of acute territorial infarct. Background parenchymal volume is normal. The ventricles are otherwise normal in size. There is patchy and confluent hypodensity in the supratentorial white matter likely reflecting sequela of underlying chronic small vessel ischemic change and remote infarcts. There is no solid mass lesion. Vascular: There is calcification of the bilateral carotid siphons. Skull: Normal. Negative for fracture or focal lesion. Sinuses/Orbits: The imaged paranasal sinuses are clear. The globes and orbits are unremarkable. Other: None. CT CERVICAL SPINE FINDINGS Alignment: Normal. There is no jumped or perched facet or other evidence of traumatic malalignment. Skull base and vertebrae: Skull base alignment is maintained. Vertebral body heights are preserved. There is no suspicious osseous lesion. Soft tissues and spinal canal: No prevertebral fluid or swelling. No visible canal  hematoma. There is prominent posterior longitudinal ligament thickening with a large protrusion at C3-C4 resulting in moderate to severe spinal canal stenosis with likely cord compression, similar to the CTA neck from 03/19/2021. Disc levels: There is disc space narrowing degenerative endplate change most advanced at C5-C6. There is overall mild-to-moderate multilevel facet arthropathy. As above, there is up to moderate to severe spinal canal stenosis at C3-C4. Upper chest: There is interlobular septal thickening in the lung apices suggesting pulmonary edema. Other: None. IMPRESSION: 1. Acute left cerebral convexity subdural hematoma measuring up to 1.1 cm in thickness with the additional subdural blood layering along the left aspect of the falx resulting in partial effacement of the left lateral ventricle and up to 5 mm rightward midline shift. Swirling hypodensity within this hematoma may reflect hyperacute blood/active bleeding. 2. Smaller more subacute appearing right cerebral convexity subdural hematoma measuring up to 3-4 mm in thickness. 3. Trace subarachnoid hemorrhage in the left sylvian fissure. 4. No acute fracture or traumatic malalignment of the cervical spine. 5. Prominent posterior longitudinal ligament thickening and large protrusion at C3-C4 resulting in moderate to severe spinal canal stenosis with likely cord compression, similar to the CTA neck from 03/19/2018. Consider further evaluation with nonemergent cervical spine MRI as indicated. 6. Interlobular septal thickening in the lung apices suggesting pulmonary edema. Critical Value/emergent results were called by telephone at the time of interpretation on 10/28/2021 at 2:58 pm to provider Dr Roderic Palau, who verbally acknowledged these results. Electronically Signed   By: Valetta Mole M.D.   On: 10/28/2021 15:12   DG Chest Port 1 View  Result Date: 10/28/2021 CLINICAL  DATA:  Fall in a 71 year old male. EXAM: PORTABLE CHEST 1 VIEW COMPARISON:  None  available FINDINGS: EKG leads project over the chest. Cardiomediastinal contours and hilar structures are normal. Lungs are clear. Assessment limited by portable technique and patient body habitus. No visible pneumothorax. On limited assessment no acute skeletal findings. IMPRESSION: No acute cardiopulmonary disease. Assessment limited by portable technique and patient body habitus. Electronically Signed   By: Zetta Bills M.D.   On: 10/28/2021 14:52   VAS [place] ABI WITH/WO TBI  Result Date: 10/03/2021  LOWER EXTREMITY DOPPLER STUDY Patient Name:  DWANYE TIPPENS  Date of Exam:   10/03/2021 Medical Rec #: [id number]        Accession #:    [id number] Date of Birth: [birth year]        Patient Gender: M Patient Age:   68 years Exam Location:  [facility] Procedure:      VAS [place] ABI WITH/WO TBI Referring Phys: --------------------------------------------------------------------------------  Indications: Ulceration, and peripheral artery disease. High Risk Factors: Hypertension, prior CVA.  Vascular Interventions: 12/27/2012 Angioplasty, left superficial femoral artery                         with drug-eluting balloon                         04/05/2017 Abdominal aortogram with bilateral lower                         extremity runoff. Atherectomy right superficial femoral                         artery, drug-coated balloon angioplasty right                         superficial femoral artery, stent right superficial                         femoral artery (6 x 150 self-expanding), stent right                         external iliac artery (7 x 40) self-expanding. Performing Technologist: Ronal Fear RVS, RCS  Examination Guidelines: A complete evaluation includes at minimum, Doppler waveform signals and systolic blood pressure reading at the level of bilateral brachial, anterior tibial, and posterior tibial arteries, when vessel segments are accessible. Bilateral testing is considered an integral part of a  complete examination. Photoelectric Plethysmograph (PPG) waveforms and toe systolic pressure readings are included as required and additional duplex testing as needed. Limited examinations for reoccurring indications may be performed as noted.  ABI Findings: +---------+------------------+-----+----------+----------+ Right    Rt Pressure (mmHg)IndexWaveform  Comment    +---------+------------------+-----+----------+----------+ PTA      123               0.80 monophasic           +---------+------------------+-----+----------+----------+ DP       110               0.72 monophasic           +---------+------------------+-----+----------+----------+ Great Toe  amputation +---------+------------------+-----+----------+----------+ +---------+------------------+-----+-------------------+----------------+ Left     Lt Pressure (mmHg)IndexWaveform           Comment          +---------+------------------+-----+-------------------+----------------+ Brachial 153                                                        +---------+------------------+-----+-------------------+----------------+ PTA                             dampened monophasicnon-compressible +---------+------------------+-----+-------------------+----------------+ DP                              dampened monophasicnon-compressible +---------+------------------+-----+-------------------+----------------+ Great Toe33                0.22                                     +---------+------------------+-----+-------------------+----------------+ +-------+-----------+-----------+------------+------------+ ABI/TBIToday's ABIToday's TBIPrevious ABIPrevious TBI +-------+-----------+-----------+------------+------------+ Right  0.80       amputation 0.70        amputation   +-------+-----------+-----------+------------+------------+ Left            0.22       0.72         0.15         +-------+-----------+-----------+------------+------------+  Right ABIs appear increased. Left ABIs are non-compressible.  Summary: Right: Resting right ankle-brachial index indicates mild right lower extremity arterial disease. Left: Resting left ankle-brachial index indicates noncompressible left lower extremity arteries. *See table(s) above for measurements and observations.  Electronically signed by Gerarda FractionJoshua Robins on 10/03/2021 at 4:17:03 PM.    Final      PHYSICAL EXAM  Temp:  [97 F (36.1 C)-98.1 F (36.7 C)] 97.6 F (36.4 C) (10/04 0800) Pulse Rate:  [71-101] 89 (10/04 0900) Resp:  [10-36] 36 (10/04 0900) BP: (102-148)/(44-111) 126/111 (10/04 0900) SpO2:  [90 %-100 %] 99 % (10/04 0900) Weight:  [108.9 kg] 108.9 kg (10/03 1434)  General - Well nourished, well developed, in no apparent distress.  Ophthalmologic - fundi not visualized due to noncooperation.  Cardiovascular - Regular rhythm and rate.  Neuro -  awake alert, hard of hearing, but orientated x3, mild to moderate dysarthria, but no aphasia, follows all simple commands. Able to name and repeat. Visual fields full, facial symmetrical on movement, may be slight nasolabial fold flattening on the left at rest, bilateral upper extremities no drift, bilateral lower extremities no drift, 3/5 proximal and 4/5 distally.  Sensation symmetrical.  bilateral finger-to-nose intact.   ASSESSMENT/PLAN James Dougherty is a 71 y.o. male with history of hypertension, hyperlipidemia, obesity, PVD with bilateral lower extremity open wounds, right ICA high-grade stenosis, CHF, cardiomyopathy presented to ED status post fall with right-sided weakness and possible seizure activity. No tPA given due to SDH.    Traumatic SDH:  left hemispheric SDH, secondary to fall and hitting head CT head - Acute left cerebral convexity subdural hematoma measuring up to 1.1 cm in thickness with the additional subdural blood layering along the left  aspect of the falx resulting in partial effacement of the left lateral ventricle and up to 5 mm rightward midline shift. CT repeat  x 2, stable SDH and midline shift, no significant change MRI  pending  MRA  pending Carotid Doppler right ICA 80 to 99% stenosis, left ICA 60 to 79% stenosis. 2D Echo  EF 35% in 06/2021 HgbA1c 6.4 SCDs for VTE prophylaxis aspirin 81 mg daily and clopidogrel 75 mg daily prior to admission, now on No antithrombotic given SDH Ongoing aggressive stroke risk factor management Therapy recommendations:  CIR Disposition:  pending  ? Seizure  Reported spaced out during driving at stoplight EEG no seizure S/p keppra load Now on keppra No driving until seizure free for 6 months and under physician's care.   PVD Carotid stenosis extensive history of PVD, left SFA-pop stenting, right great toe amputation, right SFA thrombectomy and stenting.   01/2021 right ICA 80-99% stenosis and left ICA 40 to 59% stenosis.   02/2021 CTA neck 75% stenosis left ICA, 70% stenosis right ICA, 60% stenosis left CCA. TCAR was postponed due to cardiomyopathy.   He is on aspirin and Plavix and statin at home. This admission carotid Doppler showed right ICA 80 to 99% stenosis, left ICA 60 to 79% stenosis. Continue outpatient follow-up with vascular surgery Avoid low BP, BP goal 1 30-1 50 before carotid revascularization  Cardiomyopathy  cardiomyopathy with EF 30 to 40% and global hypokinesis.   On bisoprolol, Lasix, losartan PTA Followed by cardiology  Hypertension Stable BP goal < 160 Avoid low BP due to bilateral carotid stenosis BP goal 130-150 before carotid revascularization  Other Stroke Risk Factors Advanced age Obesity, Body mass index is 36.49 kg/m.  Hx stroke/TIA in neuro imaging  Other Active Problems Hard of hearing B/l leg open wounds - improved - no dressing  Hospital day # 1  This patient is critically ill due to traumatic SAH, ?  Sizure and stroke, bilateral  carotid stenosis, PVD, cardiomyopathy and at significant risk of neurological worsening, death form cerebral edema, brain herniation, status epilepticus, recurrent stroke, ischemic limb, heart failure. This patient's care requires constant monitoring of vital signs, hemodynamics, respiratory and cardiac monitoring, review of multiple databases, neurological assessment, discussion with family, other specialists and medical decision making of high complexity. I spent 40 minutes of neurocritical care time in the care of this patient.  Rosalin Hawking, MD PhD Stroke Neurology 10/29/2021 10:18 AM    To contact Stroke Continuity provider, please refer to [email or web address] After hours, contact General Neurology

## 2021-10-30 ENCOUNTER — Telehealth: Payer: Self-pay | Admitting: Cardiology

## 2021-10-30 ENCOUNTER — Encounter: Payer: Self-pay | Admitting: Cardiology

## 2021-10-30 DIAGNOSIS — S065XAA Traumatic subdural hemorrhage with loss of consciousness status unknown, initial encounter: Secondary | ICD-10-CM | POA: Diagnosis not present

## 2021-10-30 DIAGNOSIS — I6523 Occlusion and stenosis of bilateral carotid arteries: Secondary | ICD-10-CM | POA: Diagnosis not present

## 2021-10-30 DIAGNOSIS — I255 Ischemic cardiomyopathy: Secondary | ICD-10-CM | POA: Diagnosis not present

## 2021-10-30 LAB — CBC
HCT: 39 % (ref 39.0–52.0)
Hemoglobin: 12.8 g/dL — ABNORMAL LOW (ref 13.0–17.0)
MCH: 32.1 pg (ref 26.0–34.0)
MCHC: 32.8 g/dL (ref 30.0–36.0)
MCV: 97.7 fL (ref 80.0–100.0)
Platelets: 231 10*3/uL (ref 150–400)
RBC: 3.99 MIL/uL — ABNORMAL LOW (ref 4.22–5.81)
RDW: 14.9 % (ref 11.5–15.5)
WBC: 8.9 10*3/uL (ref 4.0–10.5)
nRBC: 0 % (ref 0.0–0.2)

## 2021-10-30 LAB — BASIC METABOLIC PANEL
Anion gap: 11 (ref 5–15)
BUN: 27 mg/dL — ABNORMAL HIGH (ref 8–23)
CO2: 20 mmol/L — ABNORMAL LOW (ref 22–32)
Calcium: 8.4 mg/dL — ABNORMAL LOW (ref 8.9–10.3)
Chloride: 107 mmol/L (ref 98–111)
Creatinine, Ser: 1.4 mg/dL — ABNORMAL HIGH (ref 0.61–1.24)
GFR, Estimated: 54 mL/min — ABNORMAL LOW (ref >60)
Glucose, Bld: 112 mg/dL — ABNORMAL HIGH (ref 70–99)
Potassium: 4.4 mmol/L (ref 3.5–5.1)
Sodium: 138 mmol/L (ref 135–145)

## 2021-10-30 LAB — GLUCOSE, CAPILLARY
Glucose-Capillary: 129 mg/dL — ABNORMAL HIGH (ref 70–99)
Glucose-Capillary: 131 mg/dL — ABNORMAL HIGH (ref 70–99)
Glucose-Capillary: 132 mg/dL — ABNORMAL HIGH (ref 70–99)
Glucose-Capillary: 136 mg/dL — ABNORMAL HIGH (ref 70–99)
Glucose-Capillary: 136 mg/dL — ABNORMAL HIGH (ref 70–99)
Glucose-Capillary: 142 mg/dL — ABNORMAL HIGH (ref 70–99)
Glucose-Capillary: 153 mg/dL — ABNORMAL HIGH (ref 70–99)

## 2021-10-30 MED ORDER — MORPHINE SULFATE (PF) 2 MG/ML IV SOLN: 2.0000 mg | INTRAVENOUS | Status: DC | PRN

## 2021-10-30 MED ORDER — DEXMEDETOMIDINE HCL IN NACL 400 MCG/100ML IV SOLN: 0.4000 ug/kg/h | INTRAVENOUS | Status: DC

## 2021-10-30 MED ORDER — SODIUM ZIRCONIUM CYCLOSILICATE 5 G PO PACK
5.0000 g | PACK | Freq: Once | ORAL | Status: AC
  Administered 2021-10-30: 5 g via ORAL
  Filled 2021-10-30: qty 1

## 2021-10-30 MED ORDER — OXYCODONE HCL 5 MG/5ML PO SOLN: 2.5000 mg | ORAL | Status: DC | PRN

## 2021-10-30 MED ORDER — METHOCARBAMOL 500 MG PO TABS
1000.0000 mg | ORAL_TABLET | Freq: Three times a day (TID) | ORAL | Status: DC
  Administered 2021-10-30 – 2021-11-04 (×14): 1000 mg via ORAL
  Filled 2021-10-30 (×16): qty 2

## 2021-10-30 MED ORDER — ACETAMINOPHEN 500 MG PO TABS
1000.0000 mg | ORAL_TABLET | Freq: Four times a day (QID) | ORAL | Status: DC
  Administered 2021-10-30 – 2021-11-04 (×16): 1000 mg via ORAL
  Filled 2021-10-30 (×19): qty 2

## 2021-10-30 MED ORDER — DEXMEDETOMIDINE HCL IN NACL 400 MCG/100ML IV SOLN
0.4000 ug/kg/h | INTRAVENOUS | Status: DC
  Filled 2021-10-30: qty 100

## 2021-10-30 NOTE — Progress Notes (Addendum)
Physical Therapy Treatment Patient Details Name: James Dougherty MRN: [id number] DOB: 1950-03-06 Today's Date: 10/30/2021   History of Present Illness 71 y.o. male brought in by EMS as a level 2 trauma after he fell and hit the back of his head while he was at the tire shop with his son. CT head + left subdural hematoma as well as a trace subarachnoid hemorrhage in left sylvian fissure.  Past medical history of arthritis, hyperlipidemia, hypertension, neuropathic pain, PAD, psoriasis, stroke, diabetes    PT Comments    Patient progressing with mobility walking increased distance without symptoms of nausea or dizziness.  Needed increased time for all turns and backing up to recliner, then bed, then recliner again.  He was positive for L rotary nystagmus brief duration with L hall pike.  Treated x 1 with Eply with +2 A needed for positioning and using trendelenberg on the bed.  Patient likely will progress to home and per RN family feels they can work out initial 24/7 assist at d/c.  Continue to recommend acute inpatient rehab for short stay and caregiver education.  PT will continue acutely.    Recommendations for follow up therapy are one component of a multi-disciplinary discharge planning process, led by the attending physician.  Recommendations may be updated based on patient status, additional functional criteria and insurance authorization.  Follow Up Recommendations  Acute inpatient rehab (3hours/day)     Assistance Recommended at Discharge Frequent or constant Supervision/Assistance  Patient can return home with the following A little help with walking and/or transfers;A little help with bathing/dressing/bathroom;Assistance with cooking/housework;Assistance with feeding;Direct supervision/assist for medications management;Direct supervision/assist for financial management;Assist for transportation;Help with stairs or ramp for entrance   Equipment Recommendations  Other (comment) (defer)     Recommendations for Other Services       Precautions / Restrictions Precautions Precautions: Fall Precaution Comments: x2 recent falls     Mobility  Bed Mobility Overal bed mobility: Needs Assistance Bed Mobility: Rolling, Sit to Supine, Sidelying to Sit Rolling: Mod assist, +2 for physical assistance Sidelying to sit: Mod assist, +2 for physical assistance   Sit to supine: Mod assist   General bed mobility comments: assist for positioning for dix hallpike then Eply; unable to tolerate sitting with legs on bed so turned and from supine lowered HOB into trendelenberg and performed Eply as well    Transfers Overall transfer level: Needs assistance Equipment used: Rolling walker (2 wheels) Transfers: Sit to/from Stand Sit to Stand: Min assist           General transfer comment: assist for balance    Ambulation/Gait Ambulation/Gait assistance: Min assist Gait Distance (Feet): 200 Feet Assistive device: Rolling walker (2 wheels) Gait Pattern/deviations: Step-through pattern, Decreased stride length, Trunk flexed       General Gait Details: assist for balance, cues for walker proximity; no reports of nausea or dizziness even with pt looking to sides in hallway, did note some LE fatigue with increased distance and needing instructional cues for turning in hallway with lots of increased time and for backing up in the room with RW needing cues to back walker up too   Stairs             Wheelchair Mobility    Modified Rankin (Stroke Patients Only)       Balance Overall balance assessment: Needs assistance Sitting-balance support: Feet supported Sitting balance-Leahy Scale: Good Sitting balance - Comments: on EOB on his own texting when I entered the room  Standing balance support: Bilateral upper extremity supported Standing balance-Leahy Scale: Poor Standing balance comment: UE support for balance                            Cognition  Arousal/Alertness: Awake/alert Behavior During Therapy: WFL for tasks assessed/performed   Area of Impairment: Safety/judgement, Attention, Problem solving                   Current Attention Level: Selective     Safety/Judgement: Decreased awareness of deficits   Problem Solving: Slow processing, Difficulty sequencing, Requires verbal cues General Comments: oriented x 4, aware of plan for MRI today after sedation, reports had same in '99 when had MRI; admits to slower with texting and needing increased time; instructional cues for safety backing up (states that is how he fell)        Exercises      General Comments General comments (skin integrity, edema, etc.): VSS with mobility with some dyspnea noted; spoke with RN who reports family working to set up 24/7 care at home since not happy with talk about SNF.  PAtient with positive rotary nystagmus and symptomatic with L dix hallpike so treated with Eply x 1 for L BPPV.      Pertinent Vitals/Pain Pain Assessment Pain Assessment: Faces Faces Pain Scale: Hurts a little bit Pain Location: back, neck with position for dix hallpike Pain Descriptors / Indicators: Tightness Pain Intervention(s): Monitored during session, Repositioned    Home Living                          Prior Function            PT Goals (current goals can now be found in the care plan section) Progress towards PT goals: Progressing toward goals    Frequency    Min 4X/week      PT Plan Current plan remains appropriate    Co-evaluation              AM-PAC PT "6 Clicks" Mobility   Outcome Measure  Help needed turning from your back to your side while in a flat bed without using bedrails?: A Little Help needed moving from lying on your back to sitting on the side of a flat bed without using bedrails?: A Lot Help needed moving to and from a bed to a chair (including a wheelchair)?: A Lot Help needed standing up from a chair  using your arms (e.g., wheelchair or bedside chair)?: A Little Help needed to walk in hospital room?: A Little Help needed climbing 3-5 steps with a railing? : Total 6 Click Score: 14    End of Session Equipment Utilized During Treatment: Gait belt Activity Tolerance: Patient tolerated treatment well Patient left: in chair;with call bell/phone within reach   PT Visit Diagnosis: Unsteadiness on feet (R26.81);Other abnormalities of gait and mobility (R26.89);Repeated falls (R29.6);Other symptoms and signs involving the nervous system (R29.898);BPPV BPPV - Right/Left : Left     Time: 5638-7564 PT Time Calculation (min) (ACUTE ONLY): 48 min  Charges:  $Gait Training: 8-22 mins $Therapeutic Activity: 8-22 mins $[place]ith Rep Proc: 8-22 mins                     Magda Kiel, PT Acute Rehabilitation Services Office:[phone] 10/30/2021    Reginia Naas 10/30/2021, 11:41 AM

## 2021-10-30 NOTE — NC FL2 (Addendum)
[place] LEVEL OF CARE SCREENING TOOL     IDENTIFICATION  Patient Name: James Dougherty Birthdate: 1950-09-28 Sex: male Admission Date (Current Location): 10/28/2021  [facility] and [place] Number:  Herbalist and Address:  [facility], [place] [address], [place], [place] [address]      Provider Number: [id number]  Attending Physician Name and Address:  Md, Trauma, MD  Relative Name and Phone Number:       Current Level of Care: Hospital Recommended Level of Care: [place] Prior Approval Number:    Date Approved/Denied:   PASRR Number: [id number]  Discharge Plan: SNF    Current Diagnoses: Patient Active Problem List   Diagnosis Date Noted   Subdural hematoma ([place]) 10/28/2021   Gangrene ([place]) 04/26/2017   Cellulitis 04/01/2017   Gangrene of toe ([place]) 04/01/2017   Anemia 04/01/2017   Hyperkalemia 04/01/2017   Renal insufficiency 04/01/2017   Type II diabetes mellitus with complication ([place]) 0000000   Dry gangrene ([place]) 04/01/2017   Groin pain 07/04/2014   Peripheral vascular disease, unspecified ([place]) 06/27/2013   PAD (peripheral artery disease) ([place]) 12/27/2012   PVD (peripheral vascular disease) ([place]) 12/12/2012   Atherosclerosis of native arteries of the extremities with ulceration(440.23) 03/21/2012   Pain in limb 03/21/2012    Orientation RESPIRATION BLADDER Height & Weight     Self, Time, Situation, Place  Normal Continent Weight: 240 lb (108.9 kg) Height:  5\' 8"  (172.7 cm)  BEHAVIORAL SYMPTOMS/MOOD NEUROLOGICAL BOWEL NUTRITION STATUS      Continent    AMBULATORY STATUS COMMUNICATION OF NEEDS Skin   Limited Assist Verbally Normal                       Personal Care Assistance Level of Assistance  Bathing, Feeding, Dressing Bathing Assistance: Limited assistance Feeding assistance: Limited assistance Dressing Assistance: Limited assistance     Functional Limitations Info  Sight,  Hearing, Speech Sight Info: Impaired Hearing Info: Adequate Speech Info: Impaired    SPECIAL CARE FACTORS FREQUENCY  PT (By licensed PT), OT (By licensed OT)                    Contractures Contractures Info: Not present    Additional Factors Info                  Current Medications (10/30/2021):  This is the current hospital active medication list Current Facility-Administered Medications  Medication Dose Route Frequency Provider Last Rate Last Admin   0.9 %  sodium chloride infusion (Manually program via Guardrails IV Fluids)   Intravenous Once Fenton Malling L, NP       0.9 %  sodium chloride infusion   Intravenous Continuous Ileana Roup, MD 100 mL/hr at 10/30/21 0700 Infusion Verify at 10/30/21 0700   acetaminophen (TYLENOL) tablet 1,000 mg  1,000 mg Oral Q6H Lovick, Montel Culver, MD       Chlorhexidine Gluconate Cloth 2 % PADS 6 each  6 each Topical Q0600 Georganna Skeans, MD   6 each at 10/28/21 2130   dexmedetomidine (PRECEDEX) 400 MCG/100ML (4 mcg/mL) infusion  0.4-1.2 mcg/kg/hr Intravenous Titrated Jesusita Oka, MD       gabapentin (NEURONTIN) capsule 600 mg  600 mg Oral QHS Stechschulte, Nickola Major, MD   600 mg at 10/29/21 2143   insulin aspart (novoLOG) injection 0-15 Units  0-15 Units Subcutaneous Q4H Georganna Skeans, MD   2 Units  at 10/30/21 0845   labetalol (NORMODYNE) injection 20 mg  20 mg Intravenous Q2H PRN Georganna Skeans, MD       levETIRAcetam (KEPPRA) IVPB 500 mg/100 mL premix  500 mg Intravenous Q12H Georganna Skeans, MD 400 mL/hr at 10/30/21 0838 500 mg at 10/30/21 0838   methocarbamol (ROBAXIN) tablet 1,000 mg  1,000 mg Oral Q8H Lovick, Montel Culver, MD       metoprolol tartrate (LOPRESSOR) injection 5 mg  5 mg Intravenous Q6H PRN Georganna Skeans, MD       morphine (PF) 2 MG/ML injection 2 mg  2 mg Intravenous Q3H PRN Jesusita Oka, MD       ondansetron (ZOFRAN) injection 4 mg  4 mg Intravenous Q6H PRN Rosalin Hawking, MD   4 mg at 10/29/21 1323    Oral care mouth rinse  15 mL Mouth Rinse PRN Georganna Skeans, MD       oxyCODONE (ROXICODONE) 5 MG/5ML solution 2.5-5 mg  2.5-5 mg Per Tube Q4H PRN Jesusita Oka, MD       sodium zirconium cyclosilicate (LOKELMA) packet 5 g  5 g Oral Once Jesusita Oka, MD         Discharge Medications: Please see discharge summary for a list of discharge medications.  Relevant Imaging Results:  Relevant Lab Results:   Additional Information SS# [id number]  Amador Cunas, [place]

## 2021-10-30 NOTE — Progress Notes (Signed)
Subjective: Patient reports that he is feeling much better today. He reports a moderate headache yesterday, No current complaints. NAE ON.  Objective: Vital signs in last 24 hours: Temp:  [97.4 F (36.3 C)-99 F (37.2 C)] 97.5 F (36.4 C) (10/05 0800) Pulse Rate:  [77-92] 92 (10/05 0700) Resp:  [11-22] 20 (10/05 0700) BP: (106-141)/(51-82) 136/65 (10/05 0700) SpO2:  [90 %-98 %] 93 % (10/05 0700)  Intake/Output from previous day: 10/04 0701 - 10/05 0700 In: 2243.8 [I.V.:2043.8; IV Piggyback:200] Out: 500 [Urine:500] Intake/Output this shift: No intake/output data recorded.  Physical Exam: Patient is awake, A/O X 4, conversant, and in good spirits. Eyes open spontaneously. They are in NAD and VSS. Doing well. Speech is fluent and appropriate. Mild dysarthria persists, improving. MAEW. BUE 5/5 throughout, BLE 5/5 throughout except distal RLE 4+/5. Sensation to light touch is decreased in BLE. PERLA, EOMI. CNs grossly intact.      Lab Results: Recent Labs    10/29/21 0412 10/30/21 0318  WBC 9.9 8.9  HGB 12.6* 12.8*  HCT 37.8* 39.0  PLT 232 231   BMET Recent Labs    10/29/21 0412 10/30/21 0318  NA 136 138  K 4.9 4.4  CL 101 107  CO2 25 20*  GLUCOSE 164* 112*  BUN 19 27*  CREATININE 1.63* 1.40*  CALCIUM 8.7* 8.4*    Studies/Results: VAS US CAROTID  Result Date: 10/29/2021 Carotid Arterial Duplex Study Patient Name:  James Dougherty [facility]  Date of Exam:   10/29/2021 Medical Rec #: [id number]        Accession #:    [id number] Date of Birth: [birth year]        Patient Gender: M Patient Age:   71 years Exam Location:  [facility] Procedure:      VAS US CAROTID Referring Phys: Scheryl Marten XU --------------------------------------------------------------------------------  Indications:       CVA. Risk Factors:      Hypertension, hyperlipidemia, Diabetes, prior CVA, PAD. Comparison Study:  02/06/21 prior Performing Technologist: Argentina Ponder RVS  Examination Guidelines: A  complete evaluation includes B-mode imaging, spectral Doppler, color Doppler, and power Doppler as needed of all accessible portions of each vessel. Bilateral testing is considered an integral part of a complete examination. Limited examinations for reoccurring indications may be performed as noted.  Right Carotid Findings: +---------+--------+-------+--------+---------------------------------+--------+          PSV cm/sEDV    StenosisPlaque Description               Comments                  cm/s                                                     +---------+--------+-------+--------+---------------------------------+--------+ CCA Prox 60      13             heterogenous                              +---------+--------+-------+--------+---------------------------------+--------+ CCA      90      10             heterogenous  Distal                                                                    +---------+--------+-------+--------+---------------------------------+--------+ ICA Prox 440     148    80-99%  heterogenous, irregular and                                               calcific                                  +---------+--------+-------+--------+---------------------------------+--------+ ICA Mid  310     73                                                       +---------+--------+-------+--------+---------------------------------+--------+ ICA      67      21                                                       Distal                                                                    +---------+--------+-------+--------+---------------------------------+--------+ ECA      276                                                              +---------+--------+-------+--------+---------------------------------+--------+ +----------+--------+-------+--------+-------------------+           PSV cm/sEDV  cmsDescribeArm Pressure (mmHG) +----------+--------+-------+--------+-------------------+ Subclavian110                                        +----------+--------+-------+--------+-------------------+ +---------+--------+--+--------+--+ VertebralPSV cm/s36EDV cm/s12 +---------+--------+--+--------+--+  Left Carotid Findings: +---------+--------+-------+--------+---------------------------------+--------+          PSV cm/sEDV    StenosisPlaque Description               Comments                  cm/s                                                     +---------+--------+-------+--------+---------------------------------+--------+  CCA Prox 91      19             heterogenous                              +---------+--------+-------+--------+---------------------------------+--------+ CCA      93      20             heterogenous                              Distal                                                                    +---------+--------+-------+--------+---------------------------------+--------+ ICA Prox 217     68     60-79%  heterogenous, irregular and                                               calcific                                  +---------+--------+-------+--------+---------------------------------+--------+ ICA Mid  143     38                                                       +---------+--------+-------+--------+---------------------------------+--------+ ICA      72      20                                                       Distal                                                                    +---------+--------+-------+--------+---------------------------------+--------+ ECA      132                                                              +---------+--------+-------+--------+---------------------------------+--------+ +----------+--------+--------+--------+-------------------+            PSV cm/sEDV cm/sDescribeArm Pressure (mmHG) +----------+--------+--------+--------+-------------------+ [id number]                                         +----------+--------+--------+--------+-------------------+ +---------+--------+--+--------+-+---------+  VertebralPSV cm/s31EDV cm/s8Antegrade +---------+--------+--+--------+-+---------+   Summary: Right Carotid: Velocities in the right ICA are consistent with a 80-99%                stenosis. Left Carotid: Velocities in the left ICA are consistent with a 60-79% stenosis. Vertebrals: Bilateral vertebral arteries demonstrate antegrade flow. *See table(s) above for measurements and observations.     Preliminary    CT HEAD WO CONTRAST ( )  Result Date: 10/29/2021 CLINICAL DATA:  Follow-up subdural hematoma EXAM: CT HEAD WITHOUT CONTRAST TECHNIQUE: Contiguous axial images were obtained from the base of the skull through the vertex without intravenous contrast. RADIATION DOSE REDUCTION: This exam was performed according to the departmental dose-optimization program which includes automated exposure control, adjustment of the mA and/or kV according to patient size and/or use of iterative reconstruction technique. COMPARISON:  Yesterday FINDINGS: Brain: Primarily high-density subdural hematoma around the left cerebral convexity, both lateral and inter hemispheric, up to 1 cm in thickness laterally, unchanged. Mild mass effect with 4 mm of midline shift. Chronic small vessel ischemia in the cerebral white matter. Mixed density subdural hematoma along the right cerebral convexity measuring 3 mm in thickness. No acute gray matter infarct or hydrocephalus. Vascular: No hyperdense vessel or unexpected calcification. Skull: Normal. Negative for fracture or focal lesion. Sinuses/Orbits: Negative IMPRESSION: Unchanged acute left and mixed density right subdural hematomas, on the left measuring up to 1 cm thickness. Electronically Signed   By:  Tiburcio Pea M.D.   On: 10/29/2021 05:09   CT HEAD WO CONTRAST ( )  Result Date: 10/28/2021 CLINICAL DATA:  Head trauma EXAM: CT HEAD WITHOUT CONTRAST TECHNIQUE: Contiguous axial images were obtained from the base of the skull through the vertex without intravenous contrast. RADIATION DOSE REDUCTION: This exam was performed according to the departmental dose-optimization program which includes automated exposure control, adjustment of the mA and/or kV according to patient size and/or use of iterative reconstruction technique. COMPARISON:  10/28/2021 FINDINGS: Brain: Unchanged left hemispheric mixed density subdural hematoma that measures up to 8 mm thick. The para falcine component is also unchanged. Rightward midline shift is unchanged at 5 mm. Unchanged subacute to chronic right convexity small subdural hematoma. Vascular: No hyperdense vessel or unexpected calcification. Skull: Normal. Negative for fracture or focal lesion. Sinuses/Orbits: No acute finding. Other: None. IMPRESSION: Unchanged left hemispheric mixed density subdural hematoma with 5 mm of rightward midline shift. Electronically Signed   By: Deatra Robinson M.D.   On: 10/28/2021 21:48   EEG adult  Result Date: 10/28/2021 Charlsie Quest, MD     10/28/2021  5:21 PM Patient Name: James Dougherty MRN: [id number] Epilepsy Attending: Charlsie Quest Referring Physician/Provider: Elmer Picker, NP Date: 10/28/2021 Duration: 24.27 mins Patient history: 71 y.o. male with past medical history of arthritis, hyperlipidemia, hypertension, neuropathic pain, PAD, psoriasis, stroke, diabetes brought in by EMS as a level 2 trauma after he fell and hit the back of his head while he was at the tire shop with his son.  Last known well 1200 which is when the fall had occurred.  CT head confirmed a left subdural hematoma with a 5 mm rightward midline shift as well as a trace subarachnoid hemorrhage in left sylvian fissure.  There is a current concern for  potential seizure activity based on observation "spaced out" while driving. EEG to evaluate for seizure Level of alertness: Awake, drowsy AEDs during EEG study: LEV Technical aspects: This EEG study was done with scalp electrodes positioned according to the  10-20 International system of electrode placement. Electrical activity was reviewed with band pass filter of 1-70Hz , sensitivity of 7 uV/mm, display speed of 46mm/sec with a 60Hz  notched filter applied as appropriate. EEG data were recorded continuously and digitally stored.  Video monitoring was available and reviewed as appropriate. Description: The posterior dominant rhythm consists of 7.5 Hz activity of moderate voltage (25-35 uV) seen predominantly in posterior head regions, symmetric and reactive to eye opening and eye closing. Drowsiness was characterized by attenuation of the posterior background rhythm. Hyperventilation and photic stimulation were not performed.   IMPRESSION: This study is within normal limits. No seizures or epileptiform discharges were seen throughout the recording. A normal interictal EEG does not exclude the diagnosis of epilepsy. Lora Havens   DG Pelvis Portable  Result Date: 10/28/2021 CLINICAL DATA:  Trauma, fall EXAM: PORTABLE PELVIS 1-2 VIEWS COMPARISON:  None Available. FINDINGS: No displaced fracture or dislocation is seen. Degenerative changes are noted in both hips. Degenerative changes are noted in the visualized lower lumbar spine. Arterial calcifications are seen. There multiple arterial stents in right iliac and femoral vessels in both lower extremity. IMPRESSION: No recent fracture or dislocation is seen. Degenerative changes are noted in both hips and lumbar spine. Significant atherosclerosis. Electronically Signed   By: Elmer Picker M.D.   On: 10/28/2021 16:46   CT Cervical Spine Wo Contrast  Result Date: 10/28/2021 CLINICAL DATA:  Code stroke. Code stroke with right-sided weakness, fall. EXAM: CT  HEAD WITHOUT CONTRAST CT CERVICAL SPINE WITHOUT CONTRAST TECHNIQUE: Multidetector CT imaging of the head and cervical spine was performed following the standard protocol without intravenous contrast. Multiplanar CT image reconstructions of the cervical spine were also generated. RADIATION DOSE REDUCTION: This exam was performed according to the departmental dose-optimization program which includes automated exposure control, adjustment of the mA and/or kV according to patient size and/or use of iterative reconstruction technique. COMPARISON:  CTA neck 03/19/2018 FINDINGS: CT HEAD FINDINGS Brain: There is an acute left cerebral convexity subdural hematoma measuring up to 1.1 cm in maximal thickness. Swirling hypodensity within this hematoma may reflect hyperacute blood/active bleeding. Additional acute subdural blood layering along the left aspect of the falx measures up to 0.6 cm in thickness. There is a smaller right cerebral convexity subdural hematoma which appears more subacute in density measuring up to 3-4 mm in maximal thickness. Is trace subarachnoid hemorrhage in the left sylvian fissure There is mass effect on the underlying brain parenchyma exerted by the left-sided hematoma resulting in sulcal effacement, partial effacement of the left lateral ventricle, and 5 mm rightward midline shift. There is no evidence of acute territorial infarct. Background parenchymal volume is normal. The ventricles are otherwise normal in size. There is patchy and confluent hypodensity in the supratentorial white matter likely reflecting sequela of underlying chronic small vessel ischemic change and remote infarcts. There is no solid mass lesion. Vascular: There is calcification of the bilateral carotid siphons. Skull: Normal. Negative for fracture or focal lesion. Sinuses/Orbits: The imaged paranasal sinuses are clear. The globes and orbits are unremarkable. Other: None. CT CERVICAL SPINE FINDINGS Alignment: Normal. There is  no jumped or perched facet or other evidence of traumatic malalignment. Skull base and vertebrae: Skull base alignment is maintained. Vertebral body heights are preserved. There is no suspicious osseous lesion. Soft tissues and spinal canal: No prevertebral fluid or swelling. No visible canal hematoma. There is prominent posterior longitudinal ligament thickening with a large protrusion at C3-C4 resulting in moderate to severe spinal  canal stenosis with likely cord compression, similar to the CTA neck from 03/19/2021. Disc levels: There is disc space narrowing degenerative endplate change most advanced at C5-C6. There is overall mild-to-moderate multilevel facet arthropathy. As above, there is up to moderate to severe spinal canal stenosis at C3-C4. Upper chest: There is interlobular septal thickening in the lung apices suggesting pulmonary edema. Other: None. IMPRESSION: 1. Acute left cerebral convexity subdural hematoma measuring up to 1.1 cm in thickness with the additional subdural blood layering along the left aspect of the falx resulting in partial effacement of the left lateral ventricle and up to 5 mm rightward midline shift. Swirling hypodensity within this hematoma may reflect hyperacute blood/active bleeding. 2. Smaller more subacute appearing right cerebral convexity subdural hematoma measuring up to 3-4 mm in thickness. 3. Trace subarachnoid hemorrhage in the left sylvian fissure. 4. No acute fracture or traumatic malalignment of the cervical spine. 5. Prominent posterior longitudinal ligament thickening and large protrusion at C3-C4 resulting in moderate to severe spinal canal stenosis with likely cord compression, similar to the CTA neck from 03/19/2018. Consider further evaluation with nonemergent cervical spine MRI as indicated. 6. Interlobular septal thickening in the lung apices suggesting pulmonary edema. Critical Value/emergent results were called by telephone at the time of interpretation on  10/28/2021 at 2:58 pm to provider Dr Estell Harpin, who verbally acknowledged these results. Electronically Signed   By: Lesia Hausen M.D.   On: 10/28/2021 15:12   CT HEAD CODE STROKE WO CONTRAST  Result Date: 10/28/2021 CLINICAL DATA:  Code stroke. Code stroke with right-sided weakness, fall. EXAM: CT HEAD WITHOUT CONTRAST CT CERVICAL SPINE WITHOUT CONTRAST TECHNIQUE: Multidetector CT imaging of the head and cervical spine was performed following the standard protocol without intravenous contrast. Multiplanar CT image reconstructions of the cervical spine were also generated. RADIATION DOSE REDUCTION: This exam was performed according to the departmental dose-optimization program which includes automated exposure control, adjustment of the mA and/or kV according to patient size and/or use of iterative reconstruction technique. COMPARISON:  CTA neck 03/19/2018 FINDINGS: CT HEAD FINDINGS Brain: There is an acute left cerebral convexity subdural hematoma measuring up to 1.1 cm in maximal thickness. Swirling hypodensity within this hematoma may reflect hyperacute blood/active bleeding. Additional acute subdural blood layering along the left aspect of the falx measures up to 0.6 cm in thickness. There is a smaller right cerebral convexity subdural hematoma which appears more subacute in density measuring up to 3-4 mm in maximal thickness. Is trace subarachnoid hemorrhage in the left sylvian fissure There is mass effect on the underlying brain parenchyma exerted by the left-sided hematoma resulting in sulcal effacement, partial effacement of the left lateral ventricle, and 5 mm rightward midline shift. There is no evidence of acute territorial infarct. Background parenchymal volume is normal. The ventricles are otherwise normal in size. There is patchy and confluent hypodensity in the supratentorial white matter likely reflecting sequela of underlying chronic small vessel ischemic change and remote infarcts. There is no  solid mass lesion. Vascular: There is calcification of the bilateral carotid siphons. Skull: Normal. Negative for fracture or focal lesion. Sinuses/Orbits: The imaged paranasal sinuses are clear. The globes and orbits are unremarkable. Other: None. CT CERVICAL SPINE FINDINGS Alignment: Normal. There is no jumped or perched facet or other evidence of traumatic malalignment. Skull base and vertebrae: Skull base alignment is maintained. Vertebral body heights are preserved. There is no suspicious osseous lesion. Soft tissues and spinal canal: No prevertebral fluid or swelling. No visible canal hematoma.  There is prominent posterior longitudinal ligament thickening with a large protrusion at C3-C4 resulting in moderate to severe spinal canal stenosis with likely cord compression, similar to the CTA neck from 03/19/2021. Disc levels: There is disc space narrowing degenerative endplate change most advanced at C5-C6. There is overall mild-to-moderate multilevel facet arthropathy. As above, there is up to moderate to severe spinal canal stenosis at C3-C4. Upper chest: There is interlobular septal thickening in the lung apices suggesting pulmonary edema. Other: None. IMPRESSION: 1. Acute left cerebral convexity subdural hematoma measuring up to 1.1 cm in thickness with the additional subdural blood layering along the left aspect of the falx resulting in partial effacement of the left lateral ventricle and up to 5 mm rightward midline shift. Swirling hypodensity within this hematoma may reflect hyperacute blood/active bleeding. 2. Smaller more subacute appearing right cerebral convexity subdural hematoma measuring up to 3-4 mm in thickness. 3. Trace subarachnoid hemorrhage in the left sylvian fissure. 4. No acute fracture or traumatic malalignment of the cervical spine. 5. Prominent posterior longitudinal ligament thickening and large protrusion at C3-C4 resulting in moderate to severe spinal canal stenosis with likely cord  compression, similar to the CTA neck from 03/19/2018. Consider further evaluation with nonemergent cervical spine MRI as indicated. 6. Interlobular septal thickening in the lung apices suggesting pulmonary edema. Critical Value/emergent results were called by telephone at the time of interpretation on 10/28/2021 at 2:58 pm to provider Dr Estell HarpinZammit, who verbally acknowledged these results. Electronically Signed   By: Lesia HausenPeter  Noone M.D.   On: 10/28/2021 15:12   DG Chest Port 1 View  Result Date: 10/28/2021 CLINICAL DATA:  Fall in a 71 year old male. EXAM: PORTABLE CHEST 1 VIEW COMPARISON:  None available FINDINGS: EKG leads project over the chest. Cardiomediastinal contours and hilar structures are normal. Lungs are clear. Assessment limited by portable technique and patient body habitus. No visible pneumothorax. On limited assessment no acute skeletal findings. IMPRESSION: No acute cardiopulmonary disease. Assessment limited by portable technique and patient body habitus. Electronically Signed   By: Donzetta KohutGeoffrey  Wile M.D.   On: 10/28/2021 14:52    Assessment/Plan: 71 y.o. male [facility] CT head reveled an acute left cerebral convexity SDH measuring up to 1.1 cm resulting in partial effacement of the left lateral ventricle with approximately 5 mm rightward midline shift, small right cerebral convexity SDH measuring up to 3-4 mm in thickness, and trace SAH in the left sylvian fissure. He was given DDAVP and a platelet transfusion while in the ED. His neurological exam initially revealed lethargy with minimal RLE weakness. Since admission, his neurological status has improved. No acute neurosurgical intervention is indicated. Plan for SNF once medically ready. Continue supportive care. He is ok to transfer out of the ICU from a NSX perspective.  -Hold aspirin and Plavix -Keppra 500 mg BID X 7 days -Ok to begin pharmacologic DVT prophylaxis -Continue therapy -Repeat CT head in 2  weeks with outpatient follow up   LOS: 2 days     Council MechanicJoshua L. Joseph Johns, DNP, AGNP-C Neurosurgery Nurse Practitioner  [facility] [address], [address], [place], [place] [address] P: [phone]    F: [phone]  10/30/2021 9:51 AM

## 2021-10-30 NOTE — Progress Notes (Signed)
Trauma/Critical Care Follow Up Note  Subjective:    Overnight Issues:   Objective:  Vital signs for last 24 hours: Temp:  [97.4 F (36.3 C)-99 F (37.2 C)] 97.5 F (36.4 C) (10/05 0800) Pulse Rate:  [77-92] 92 (10/05 0700) Resp:  [11-22] 20 (10/05 0700) BP: (106-141)/(51-82) 136/65 (10/05 0700) SpO2:  [90 %-98 %] 93 % (10/05 0700)  Hemodynamic parameters for last 24 hours:    Intake/Output from previous day: 10/04 0701 - 10/05 0700 In: 2243.8 [I.V.:2043.8; IV Piggyback:200] Out: 500 [Urine:500]  Intake/Output this shift: No intake/output data recorded.  Vent settings for last 24 hours:    Physical Exam:  Gen: comfortable, no distress Neuro: non-focal exam HEENT: PERRL Neck: supple CV: RRR Pulm: unlabored breathing Abd: soft, NT GU: clear yellow urine Extr: wwp, no edema   Results for orders placed or performed during the hospital encounter of 10/28/21 (from the past 24 hour(s))  Glucose, capillary     Status: Abnormal   Collection Time: 10/29/21 11:40 AM  Result Value Ref Range   Glucose-Capillary 146 (H) 70 - 99 mg/dL  BLOOD TRANSFUSION REPORT - SCANNED     Status: None   Collection Time: 10/29/21  2:31 PM   Narrative   Ordered by an unspecified provider.  Glucose, capillary     Status: Abnormal   Collection Time: 10/29/21  3:50 PM  Result Value Ref Range   Glucose-Capillary 172 (H) 70 - 99 mg/dL  Glucose, capillary     Status: Abnormal   Collection Time: 10/29/21  8:08 PM  Result Value Ref Range   Glucose-Capillary 148 (H) 70 - 99 mg/dL  Glucose, capillary     Status: Abnormal   Collection Time: 10/30/21 12:01 AM  Result Value Ref Range   Glucose-Capillary 142 (H) 70 - 99 mg/dL  CBC     Status: Abnormal   Collection Time: 10/30/21  3:18 AM  Result Value Ref Range   WBC 8.9 4.0 - 10.5 K/uL   RBC 3.99 (L) 4.22 - 5.81 MIL/uL   Hemoglobin 12.8 (L) 13.0 - 17.0 g/dL   HCT 98.3 38.2 - 50.5 %   MCV 97.7 80.0 - 100.0 fL   MCH 32.1 26.0 - 34.0 pg    MCHC 32.8 30.0 - 36.0 g/dL   RDW 39.7 67.3 - 41.9 %   Platelets 231 150 - 400 K/uL   nRBC 0.0 0.0 - 0.2 %  Basic metabolic panel     Status: Abnormal   Collection Time: 10/30/21  3:18 AM  Result Value Ref Range   Sodium 138 135 - 145 mmol/L   Potassium 4.4 3.5 - 5.1 mmol/L   Chloride 107 98 - 111 mmol/L   CO2 20 (L) 22 - 32 mmol/L   Glucose, Bld 112 (H) 70 - 99 mg/dL   BUN 27 (H) 8 - 23 mg/dL   Creatinine, Ser 3.79 (H) 0.61 - 1.24 mg/dL   Calcium 8.4 (L) 8.9 - 10.3 mg/dL   GFR, Estimated 54 (L) >60 mL/min   Anion gap 11 5 - 15  Glucose, capillary     Status: Abnormal   Collection Time: 10/30/21  4:17 AM  Result Value Ref Range   Glucose-Capillary 129 (H) 70 - 99 mg/dL  Glucose, capillary     Status: Abnormal   Collection Time: 10/30/21  8:26 AM  Result Value Ref Range   Glucose-Capillary 132 (H) 70 - 99 mg/dL    Assessment & Plan: The plan of care was discussed with  the bedside nurse for the day, Marlowe Kays, who is in agreement with this plan and no additional concerns were raised.   Present on Admission:  Subdural hematoma (HCC)    LOS: 2 days   Additional comments:I reviewed the patient's new clinical lab test results.   and I reviewed the patients new imaging test results.    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 Marcello Moores whom is following from neurosurgery - rec'd hold ASA and Plavix, DDAVP, F/U CT head 10/4. CT unchanged; Keppra x7d for sz ppx Chronic LE wounds - WOC eval - appreciate assistance HTN - hold home meds as NPO, lopressor PRN DM2 - SSI Elevated creatinine - possibly some chronicity, downtrending, MIVF to 100cc/hr; monitor VTE - PAS FEN - carb mod Dispo: ICU today   Jesusita Oka, MD Trauma & General Surgery Please use [email or web address] to contact on call provider  10/30/2021  *Care during the described time interval was provided by me. I have reviewed this patient's available data, including medical history, events of note,  physical examination and test results as part of my evaluation.

## 2021-10-30 NOTE — Progress Notes (Signed)
Difficulties with MRI scheduling and patient will not tolerate MRI without continuous sedation. Discussed with Dr. Erlinda Hong, Neurology, and Dr. Marcello Moores, NSGY, and will cancel MRIs.Okay to transfer.  Jesusita Oka, MD General and [place] [facility]

## 2021-10-30 NOTE — Telephone Encounter (Signed)
Pt's son is requesting a call back in regards to upcoming procedure. He states that his father had a fall and had to be taken to the hospital where they found bleeding in the brain. Pt's son states that it has since stopped and wondered if the patient will be able to continue with the procedure, or if it should be rescheduled. Please advise.

## 2021-10-30 NOTE — Progress Notes (Addendum)
STROKE TEAM PROGRESS NOTE   SUBJECTIVE (INTERVAL HISTORY) His RN is at the bedside.  Pt was also seen walking with PT in the hallway. He still has mild dysarthria but motor strength improved significantly. MRI pending but not alter management plan. Pt stated that he is very claustrophobic and asking for general anesthesia. Discussed with Dr. Bobbye Morton and will cancel at this time.    OBJECTIVE Temp:  [97.4 F (36.3 C)-99 F (37.2 C)] 98.7 F (37.1 C) (10/05 1159) Pulse Rate:  [77-92] 92 (10/05 0700) Cardiac Rhythm: Normal sinus rhythm (10/04 2000) Resp:  [11-22] 20 (10/05 0700) BP: (106-141)/(51-82) 136/65 (10/05 0700) SpO2:  [90 %-98 %] 93 % (10/05 0700)  Recent Labs  Lab 10/29/21 2008 10/30/21 0001 10/30/21 0417 10/30/21 0826 10/30/21 1132  GLUCAP 148* 142* 129* 132* 136*   Recent Labs  Lab 10/28/21 1436 10/28/21 1510 10/29/21 0412 10/30/21 0318  NA 138 137 136 138  K 4.8 5.0 4.9 4.4  CL 103 99 101 107  CO2 24  --  25 20*  GLUCOSE 189* 181* 164* 112*  BUN 15 20 19  27*  CREATININE 1.72* 1.70* 1.63* 1.40*  CALCIUM 8.9  --  8.7* 8.4*   Recent Labs  Lab 10/28/21 1436  AST 27  ALT 19  ALKPHOS 61  BILITOT 1.2  PROT 6.3*  ALBUMIN 3.5   Recent Labs  Lab 10/28/21 1436 10/28/21 1510 10/29/21 0412 10/30/21 0318  WBC 10.6*  --  9.9 8.9  HGB 13.5 14.3 12.6* 12.8*  HCT 41.2 42.0 37.8* 39.0  MCV 97.4  --  96.2 97.7  PLT 199  --  232 231   No results for input(s): "CKTOTAL", "CKMB", "CKMBINDEX", "TROPONINI" in the last 168 hours. Recent Labs    10/28/21 1436  LABPROT 14.0  INR 1.1   Recent Labs    10/28/21 2206  COLORURINE YELLOW  LABSPEC 1.015  PHURINE 7.0  GLUCOSEU 50*  HGBUR NEGATIVE  BILIRUBINUR NEGATIVE  KETONESUR 20*  PROTEINUR NEGATIVE  NITRITE NEGATIVE  LEUKOCYTESUR NEGATIVE    No results found for: "CHOL", "TRIG", "HDL", "CHOLHDL", "VLDL", "LDLCALC" Lab Results  Component Value Date   HGBA1C 6.4 (H) 10/28/2021   No results found for:  "LABOPIA", "COCAINSCRNUR", "LABBENZ", "AMPHETMU", "THCU", "LABBARB"  Recent Labs  Lab 10/28/21 [place] <10    I have personally reviewed the radiological images below and agree with the radiology interpretations.  VAS US CAROTID  Result Date: 10/29/2021 Carotid Arterial Duplex Study Patient Name:  James Dougherty [facility]  Date of Exam:   10/29/2021 Medical Rec #: [id number]        Accession #:    [id number] Date of Birth: [birth year]        Patient Gender: M Patient Age:   29 years Exam Location:  [facility] Procedure:      VAS US CAROTID Referring Phys: Cornelius Moras Sherrilyn Nairn --------------------------------------------------------------------------------  Indications:       CVA. Risk Factors:      Hypertension, hyperlipidemia, Diabetes, prior CVA, PAD. Comparison Study:  02/06/21 prior Performing Technologist: Archie Patten RVS  Examination Guidelines: A complete evaluation includes B-mode imaging, spectral Doppler, color Doppler, and power Doppler as needed of all accessible portions of each vessel. Bilateral testing is considered an integral part of a complete examination. Limited examinations for reoccurring indications may be performed as noted.  Right Carotid Findings: +---------+--------+-------+--------+---------------------------------+--------+          PSV cm/sEDV    StenosisPlaque Description  Comments                  cm/s                                                     +---------+--------+-------+--------+---------------------------------+--------+ CCA Prox 60      13             heterogenous                              +---------+--------+-------+--------+---------------------------------+--------+ CCA      90      10             heterogenous                              Distal                                                                    +---------+--------+-------+--------+---------------------------------+--------+ ICA Prox 440     148     80-99%  heterogenous, irregular and                                               calcific                                  +---------+--------+-------+--------+---------------------------------+--------+ ICA Mid  310     73                                                       +---------+--------+-------+--------+---------------------------------+--------+ ICA      67      21                                                       Distal                                                                    +---------+--------+-------+--------+---------------------------------+--------+ ECA      276                                                              +---------+--------+-------+--------+---------------------------------+--------+ +----------+--------+-------+--------+-------------------+  PSV cm/sEDV cmsDescribeArm Pressure (mmHG) +----------+--------+-------+--------+-------------------+ Subclavian110                                        +----------+--------+-------+--------+-------------------+ +---------+--------+--+--------+--+ VertebralPSV cm/s36EDV cm/s12 +---------+--------+--+--------+--+  Left Carotid Findings: +---------+--------+-------+--------+---------------------------------+--------+          PSV cm/sEDV    StenosisPlaque Description               Comments                  cm/s                                                     +---------+--------+-------+--------+---------------------------------+--------+ CCA Prox 91      19             heterogenous                              +---------+--------+-------+--------+---------------------------------+--------+ CCA      93      20             heterogenous                              Distal                                                                    +---------+--------+-------+--------+---------------------------------+--------+ ICA Prox  217     68     60-79%  heterogenous, irregular and                                               calcific                                  +---------+--------+-------+--------+---------------------------------+--------+ ICA Mid  143     38                                                       +---------+--------+-------+--------+---------------------------------+--------+ ICA      72      20                                                       Distal                                                                    +---------+--------+-------+--------+---------------------------------+--------+  ECA      132                                                              +---------+--------+-------+--------+---------------------------------+--------+ +----------+--------+--------+--------+-------------------+           PSV cm/sEDV cm/sDescribeArm Pressure (mmHG) +----------+--------+--------+--------+-------------------+ [id number]                                         +----------+--------+--------+--------+-------------------+ +---------+--------+--+--------+-+---------+ VertebralPSV cm/s31EDV cm/s8Antegrade +---------+--------+--+--------+-+---------+   Summary: Right Carotid: Velocities in the right ICA are consistent with a 80-99%                stenosis. Left Carotid: Velocities in the left ICA are consistent with a 60-79% stenosis. Vertebrals: Bilateral vertebral arteries demonstrate antegrade flow. *See table(s) above for measurements and observations.     Preliminary    CT HEAD WO CONTRAST (5MM)  Result Date: 10/29/2021 CLINICAL DATA:  Follow-up subdural hematoma EXAM: CT HEAD WITHOUT CONTRAST TECHNIQUE: Contiguous axial images were obtained from the base of the skull through the vertex without intravenous contrast. RADIATION DOSE REDUCTION: This exam was performed according to the departmental dose-optimization program which includes  automated exposure control, adjustment of the mA and/or kV according to patient size and/or use of iterative reconstruction technique. COMPARISON:  Yesterday FINDINGS: Brain: Primarily high-density subdural hematoma around the left cerebral convexity, both lateral and inter hemispheric, up to 1 cm in thickness laterally, unchanged. Mild mass effect with 4 mm of midline shift. Chronic small vessel ischemia in the cerebral white matter. Mixed density subdural hematoma along the right cerebral convexity measuring 3 mm in thickness. No acute gray matter infarct or hydrocephalus. Vascular: No hyperdense vessel or unexpected calcification. Skull: Normal. Negative for fracture or focal lesion. Sinuses/Orbits: Negative IMPRESSION: Unchanged acute left and mixed density right subdural hematomas, on the left measuring up to 1 cm thickness. Electronically Signed   By: Jorje Guild M.D.   On: 10/29/2021 05:09   CT HEAD WO CONTRAST (5MM)  Result Date: 10/28/2021 CLINICAL DATA:  Head trauma EXAM: CT HEAD WITHOUT CONTRAST TECHNIQUE: Contiguous axial images were obtained from the base of the skull through the vertex without intravenous contrast. RADIATION DOSE REDUCTION: This exam was performed according to the departmental dose-optimization program which includes automated exposure control, adjustment of the mA and/or kV according to patient size and/or use of iterative reconstruction technique. COMPARISON:  10/28/2021 FINDINGS: Brain: Unchanged left hemispheric mixed density subdural hematoma that measures up to 8 mm thick. The para falcine component is also unchanged. Rightward midline shift is unchanged at 5 mm. Unchanged subacute to chronic right convexity small subdural hematoma. Vascular: No hyperdense vessel or unexpected calcification. Skull: Normal. Negative for fracture or focal lesion. Sinuses/Orbits: No acute finding. Other: None. IMPRESSION: Unchanged left hemispheric mixed density subdural hematoma with 5 mm  of rightward midline shift. Electronically Signed   By: Ulyses Jarred M.D.   On: 10/28/2021 21:48   EEG adult  Result Date: 10/28/2021 Lora Havens, MD     10/28/2021  5:21 PM Patient Name: James Dougherty MRN: [id number] Epilepsy Attending: Lora Havens Referring Physician/Provider: Janine Ores, NP Date: 10/28/2021 Duration: 24.27 mins Patient history: 71  y.o. male with past medical history of arthritis, hyperlipidemia, hypertension, neuropathic pain, PAD, psoriasis, stroke, diabetes brought in by EMS as a level 2 trauma after he fell and hit the back of his head while he was at the tire shop with his son.  Last known well 1200 which is when the fall had occurred.  CT head confirmed a left subdural hematoma with a 5 mm rightward midline shift as well as a trace subarachnoid hemorrhage in left sylvian fissure.  There is a current concern for potential seizure activity based on observation "spaced out" while driving. EEG to evaluate for seizure Level of alertness: Awake, drowsy AEDs during EEG study: LEV Technical aspects: This EEG study was done with scalp electrodes positioned according to the 10-20 International system of electrode placement. Electrical activity was reviewed with band pass filter of 1-70Hz , sensitivity of 7 uV/mm, display speed of 72mm/sec with a 60Hz  notched filter applied as appropriate. EEG data were recorded continuously and digitally stored.  Video monitoring was available and reviewed as appropriate. Description: The posterior dominant rhythm consists of 7.5 Hz activity of moderate voltage (25-35 uV) seen predominantly in posterior head regions, symmetric and reactive to eye opening and eye closing. Drowsiness was characterized by attenuation of the posterior background rhythm. Hyperventilation and photic stimulation were not performed.   IMPRESSION: This study is within normal limits. No seizures or epileptiform discharges were seen throughout the recording. A normal interictal  EEG does not exclude the diagnosis of epilepsy. Lora Havens   DG Pelvis Portable  Result Date: 10/28/2021 CLINICAL DATA:  Trauma, fall EXAM: PORTABLE PELVIS 1-2 VIEWS COMPARISON:  None Available. FINDINGS: No displaced fracture or dislocation is seen. Degenerative changes are noted in both hips. Degenerative changes are noted in the visualized lower lumbar spine. Arterial calcifications are seen. There multiple arterial stents in right iliac and femoral vessels in both lower extremity. IMPRESSION: No recent fracture or dislocation is seen. Degenerative changes are noted in both hips and lumbar spine. Significant atherosclerosis. Electronically Signed   By: Elmer Picker M.D.   On: 10/28/2021 16:46   CT Cervical Spine Wo Contrast  Result Date: 10/28/2021 CLINICAL DATA:  Code stroke. Code stroke with right-sided weakness, fall. EXAM: CT HEAD WITHOUT CONTRAST CT CERVICAL SPINE WITHOUT CONTRAST TECHNIQUE: Multidetector CT imaging of the head and cervical spine was performed following the standard protocol without intravenous contrast. Multiplanar CT image reconstructions of the cervical spine were also generated. RADIATION DOSE REDUCTION: This exam was performed according to the departmental dose-optimization program which includes automated exposure control, adjustment of the mA and/or kV according to patient size and/or use of iterative reconstruction technique. COMPARISON:  CTA neck 03/19/2018 FINDINGS: CT HEAD FINDINGS Brain: There is an acute left cerebral convexity subdural hematoma measuring up to 1.1 cm in maximal thickness. Swirling hypodensity within this hematoma may reflect hyperacute blood/active bleeding. Additional acute subdural blood layering along the left aspect of the falx measures up to 0.6 cm in thickness. There is a smaller right cerebral convexity subdural hematoma which appears more subacute in density measuring up to 3-4 mm in maximal thickness. Is trace subarachnoid  hemorrhage in the left sylvian fissure There is mass effect on the underlying brain parenchyma exerted by the left-sided hematoma resulting in sulcal effacement, partial effacement of the left lateral ventricle, and 5 mm rightward midline shift. There is no evidence of acute territorial infarct. Background parenchymal volume is normal. The ventricles are otherwise normal in size. There is patchy and confluent  hypodensity in the supratentorial white matter likely reflecting sequela of underlying chronic small vessel ischemic change and remote infarcts. There is no solid mass lesion. Vascular: There is calcification of the bilateral carotid siphons. Skull: Normal. Negative for fracture or focal lesion. Sinuses/Orbits: The imaged paranasal sinuses are clear. The globes and orbits are unremarkable. Other: None. CT CERVICAL SPINE FINDINGS Alignment: Normal. There is no jumped or perched facet or other evidence of traumatic malalignment. Skull base and vertebrae: Skull base alignment is maintained. Vertebral body heights are preserved. There is no suspicious osseous lesion. Soft tissues and spinal canal: No prevertebral fluid or swelling. No visible canal hematoma. There is prominent posterior longitudinal ligament thickening with a large protrusion at C3-C4 resulting in moderate to severe spinal canal stenosis with likely cord compression, similar to the CTA neck from 03/19/2021. Disc levels: There is disc space narrowing degenerative endplate change most advanced at C5-C6. There is overall mild-to-moderate multilevel facet arthropathy. As above, there is up to moderate to severe spinal canal stenosis at C3-C4. Upper chest: There is interlobular septal thickening in the lung apices suggesting pulmonary edema. Other: None. IMPRESSION: 1. Acute left cerebral convexity subdural hematoma measuring up to 1.1 cm in thickness with the additional subdural blood layering along the left aspect of the falx resulting in partial  effacement of the left lateral ventricle and up to 5 mm rightward midline shift. Swirling hypodensity within this hematoma may reflect hyperacute blood/active bleeding. 2. Smaller more subacute appearing right cerebral convexity subdural hematoma measuring up to 3-4 mm in thickness. 3. Trace subarachnoid hemorrhage in the left sylvian fissure. 4. No acute fracture or traumatic malalignment of the cervical spine. 5. Prominent posterior longitudinal ligament thickening and large protrusion at C3-C4 resulting in moderate to severe spinal canal stenosis with likely cord compression, similar to the CTA neck from 03/19/2018. Consider further evaluation with nonemergent cervical spine MRI as indicated. 6. Interlobular septal thickening in the lung apices suggesting pulmonary edema. Critical Value/emergent results were called by telephone at the time of interpretation on 10/28/2021 at 2:58 pm to provider Dr Roderic Palau, who verbally acknowledged these results. Electronically Signed   By: Valetta Mole M.D.   On: 10/28/2021 15:12   CT HEAD CODE STROKE WO CONTRAST  Result Date: 10/28/2021 CLINICAL DATA:  Code stroke. Code stroke with right-sided weakness, fall. EXAM: CT HEAD WITHOUT CONTRAST CT CERVICAL SPINE WITHOUT CONTRAST TECHNIQUE: Multidetector CT imaging of the head and cervical spine was performed following the standard protocol without intravenous contrast. Multiplanar CT image reconstructions of the cervical spine were also generated. RADIATION DOSE REDUCTION: This exam was performed according to the departmental dose-optimization program which includes automated exposure control, adjustment of the mA and/or kV according to patient size and/or use of iterative reconstruction technique. COMPARISON:  CTA neck 03/19/2018 FINDINGS: CT HEAD FINDINGS Brain: There is an acute left cerebral convexity subdural hematoma measuring up to 1.1 cm in maximal thickness. Swirling hypodensity within this hematoma may reflect hyperacute  blood/active bleeding. Additional acute subdural blood layering along the left aspect of the falx measures up to 0.6 cm in thickness. There is a smaller right cerebral convexity subdural hematoma which appears more subacute in density measuring up to 3-4 mm in maximal thickness. Is trace subarachnoid hemorrhage in the left sylvian fissure There is mass effect on the underlying brain parenchyma exerted by the left-sided hematoma resulting in sulcal effacement, partial effacement of the left lateral ventricle, and 5 mm rightward midline shift. There is no evidence of acute  territorial infarct. Background parenchymal volume is normal. The ventricles are otherwise normal in size. There is patchy and confluent hypodensity in the supratentorial white matter likely reflecting sequela of underlying chronic small vessel ischemic change and remote infarcts. There is no solid mass lesion. Vascular: There is calcification of the bilateral carotid siphons. Skull: Normal. Negative for fracture or focal lesion. Sinuses/Orbits: The imaged paranasal sinuses are clear. The globes and orbits are unremarkable. Other: None. CT CERVICAL SPINE FINDINGS Alignment: Normal. There is no jumped or perched facet or other evidence of traumatic malalignment. Skull base and vertebrae: Skull base alignment is maintained. Vertebral body heights are preserved. There is no suspicious osseous lesion. Soft tissues and spinal canal: No prevertebral fluid or swelling. No visible canal hematoma. There is prominent posterior longitudinal ligament thickening with a large protrusion at C3-C4 resulting in moderate to severe spinal canal stenosis with likely cord compression, similar to the CTA neck from 03/19/2021. Disc levels: There is disc space narrowing degenerative endplate change most advanced at C5-C6. There is overall mild-to-moderate multilevel facet arthropathy. As above, there is up to moderate to severe spinal canal stenosis at C3-C4. Upper chest:  There is interlobular septal thickening in the lung apices suggesting pulmonary edema. Other: None. IMPRESSION: 1. Acute left cerebral convexity subdural hematoma measuring up to 1.1 cm in thickness with the additional subdural blood layering along the left aspect of the falx resulting in partial effacement of the left lateral ventricle and up to 5 mm rightward midline shift. Swirling hypodensity within this hematoma may reflect hyperacute blood/active bleeding. 2. Smaller more subacute appearing right cerebral convexity subdural hematoma measuring up to 3-4 mm in thickness. 3. Trace subarachnoid hemorrhage in the left sylvian fissure. 4. No acute fracture or traumatic malalignment of the cervical spine. 5. Prominent posterior longitudinal ligament thickening and large protrusion at C3-C4 resulting in moderate to severe spinal canal stenosis with likely cord compression, similar to the CTA neck from 03/19/2018. Consider further evaluation with nonemergent cervical spine MRI as indicated. 6. Interlobular septal thickening in the lung apices suggesting pulmonary edema. Critical Value/emergent results were called by telephone at the time of interpretation on 10/28/2021 at 2:58 pm to provider Dr Roderic Palau, who verbally acknowledged these results. Electronically Signed   By: Valetta Mole M.D.   On: 10/28/2021 15:12   DG Chest Port 1 View  Result Date: 10/28/2021 CLINICAL DATA:  Fall in a 71 year old male. EXAM: PORTABLE CHEST 1 VIEW COMPARISON:  None available FINDINGS: EKG leads project over the chest. Cardiomediastinal contours and hilar structures are normal. Lungs are clear. Assessment limited by portable technique and patient body habitus. No visible pneumothorax. On limited assessment no acute skeletal findings. IMPRESSION: No acute cardiopulmonary disease. Assessment limited by portable technique and patient body habitus. Electronically Signed   By: Zetta Bills M.D.   On: 10/28/2021 14:52   VAS [place] ABI WITH/WO  TBI  Result Date: 10/03/2021  LOWER EXTREMITY DOPPLER STUDY Patient Name:  TIMO DRUST  Date of Exam:   10/03/2021 Medical Rec #: [id number]        Accession #:    [id number] Date of Birth: [birth year]        Patient Gender: M Patient Age:   59 years Exam Location:  [facility] Procedure:      VAS [place] ABI WITH/WO TBI Referring Phys: --------------------------------------------------------------------------------  Indications: Ulceration, and peripheral artery disease. High Risk Factors: Hypertension, prior CVA.  Vascular Interventions: 12/27/2012 Angioplasty, left superficial femoral artery  with drug-eluting balloon                         04/05/2017 Abdominal aortogram with bilateral lower                         extremity runoff. Atherectomy right superficial femoral                         artery, drug-coated balloon angioplasty right                         superficial femoral artery, stent right superficial                         femoral artery (6 x 150 self-expanding), stent right                         external iliac artery (7 x 40) self-expanding. Performing Technologist: Ronal Fear RVS, RCS  Examination Guidelines: A complete evaluation includes at minimum, Doppler waveform signals and systolic blood pressure reading at the level of bilateral brachial, anterior tibial, and posterior tibial arteries, when vessel segments are accessible. Bilateral testing is considered an integral part of a complete examination. Photoelectric Plethysmograph (PPG) waveforms and toe systolic pressure readings are included as required and additional duplex testing as needed. Limited examinations for reoccurring indications may be performed as noted.  ABI Findings: +---------+------------------+-----+----------+----------+ Right    Rt Pressure (mmHg)IndexWaveform  Comment    +---------+------------------+-----+----------+----------+ PTA      123               0.80  monophasic           +---------+------------------+-----+----------+----------+ DP       110               0.72 monophasic           +---------+------------------+-----+----------+----------+ Great Toe                                 amputation +---------+------------------+-----+----------+----------+ +---------+------------------+-----+-------------------+----------------+ Left     Lt Pressure (mmHg)IndexWaveform           Comment          +---------+------------------+-----+-------------------+----------------+ Brachial 153                                                        +---------+------------------+-----+-------------------+----------------+ PTA                             dampened monophasicnon-compressible +---------+------------------+-----+-------------------+----------------+ DP                              dampened monophasicnon-compressible +---------+------------------+-----+-------------------+----------------+ [place] To[place]33                0.22                                     +---------+------------------+-----+-------------------+----------------+ +-------+-----------+-----------+------------+------------+ ABI/TBIToday's ABIToday's TBIPrevious  ABIPrevious TBI +-------+-----------+-----------+------------+------------+ Right  0.80       amputation 0.70        amputation   +-------+-----------+-----------+------------+------------+ Left   [place]         0.22       0.72        0.15         +-------+-----------+-----------+------------+------------+  Right ABIs appear increased. Left ABIs are non-compressible.  Summary: Right: Resting right ankle-brachial index indicates mild right lower extremity arterial disease. Left: Resting left ankle-brachial index indicates noncompressible left lower extremity arteries. *See table(s) above for measurements and observations.  Electronically signed by Orlie Pollen on 10/03/2021 at 4:17:03 PM.    Final       PHYSICAL EXAM  Temp:  [97.4 F (36.3 C)-99 F (37.2 C)] 98.7 F (37.1 C) (10/05 1159) Pulse Rate:  [77-92] 92 (10/05 0700) Resp:  [11-22] 20 (10/05 0700) BP: (106-141)/(51-82) 136/65 (10/05 0700) SpO2:  [90 %-98 %] 93 % (10/05 0700)  General - Well nourished, well developed, in no apparent distress.  Ophthalmologic - fundi not visualized due to noncooperation.  Cardiovascular - Regular rhythm and rate.  Neuro -  awake alert, hard of hearing, but orientated x3, mild to moderate dysarthria, but no aphasia, follows all simple commands. Able to name and repeat. Visual fields full, facial symmetrical on movement, may be slight nasolabial fold flattening on the left at rest, bilateral upper extremities no drift, bilateral lower extremities no drift, 3/5 proximal and 4/5 distally.  Sensation symmetrical.  bilateral finger-to-nose intact.   ASSESSMENT/PLAN James Dougherty is a 71 y.o. male with history of hypertension, hyperlipidemia, obesity, PVD with bilateral lower extremity open wounds, right ICA high-grade stenosis, CHF, cardiomyopathy presented to ED status post fall with right-sided weakness and possible seizure activity. No tPA given due to SDH.    Traumatic SDH:  left hemispheric SDH, secondary to fall and hitting head CT head - Acute left cerebral convexity subdural hematoma measuring up to 1.1 cm in thickness with the additional subdural blood layering along the left aspect of the falx resulting in partial effacement of the left lateral ventricle and up to 5 mm rightward midline shift. CT repeat x 2, stable SDH and midline shift, no significant change MRI and MRA cancelled due to claustrophobia and not change management  Carotid Doppler right ICA 80 to 99% stenosis, left ICA 60 to 79% stenosis. 2D Echo  EF 35% in 06/2021 HgbA1c 6.4 SCDs for VTE prophylaxis aspirin 81 mg daily and clopidogrel 75 mg daily prior to admission, now on No antithrombotic given SDH. Once SDH  resolves, may resume antiplatelets.  Ongoing aggressive stroke risk factor management Therapy recommendations:  CIR Disposition:  pending  ? Seizure  Reported spaced out during driving at stoplight EEG no seizure S/p keppra load Now on keppra No driving until seizure free for 6 months and under physician's care.   PVD Carotid stenosis extensive history of PVD, left SFA-pop stenting, right great toe amputation, right SFA thrombectomy and stenting.   01/2021 right ICA 80-99% stenosis and left ICA 40 to 59% stenosis.   02/2021 CTA neck 75% stenosis left ICA, 70% stenosis right ICA, 60% stenosis left CCA. TCAR was postponed due to cardiomyopathy.   He is on aspirin and Plavix and statin at home. This admission carotid Doppler showed right ICA 80 to 99% stenosis, left ICA 60 to 79% stenosis. Continue outpatient follow-up with vascular surgery Avoid low BP, BP goal 130-150 before carotid revascularization  Cardiomyopathy  cardiomyopathy with EF 30 to 40% and global hypokinesis.   On bisoprolol, Lasix, losartan PTA Continue outpt follow up with cardiology  Hypertension Stable BP goal < 160 Avoid low BP due to bilateral carotid stenosis BP goal 130-150 before carotid revascularization  Other Stroke Risk Factors Advanced age Obesity, Body mass index is 36.49 kg/m.  Hx stroke/TIA in neuro imaging  Other Active Problems Hard of hearing B/l leg open wounds - improved - no dressing  Hospital day # 2  Neurology will sign off. Please call with questions. Pt will follow up with stroke clinic Dr. Leonie Man at [facility] in about 4 weeks. Thanks for the consult.   Rosalin Hawking, MD PhD Stroke Neurology 10/30/2021 1:29 PM    To contact Stroke Continuity provider, please refer to [email or web address] After hours, contact General Neurology

## 2021-10-30 NOTE — Telephone Encounter (Signed)
error 

## 2021-10-30 NOTE — TOC Initial Note (Signed)
Transition of Care [facility]) - Initial/Assessment Note    Patient Details  Name: James Dougherty MRN: [id number] Date of Birth: Jun 11, 1950  Transition of Care [facility]) CM/SW Contact:    Amador Cunas, [place] Phone Number: 10/30/2021, 10:43 AM  Clinical Narrative:  SW spoke to pt's son James Dougherty who confirmed pt/family requesting [facility] SNF for rehab. Referral made to [facility] with [facility] admissions who confirmed they are able to offer at bed. Will need to start [place] closer to dc. SW will follow and assist as indicated.   Wandra Feinstein, MSW, LCSW [phone] (coverage)                 Expected Discharge Plan: [place] Barriers to Discharge: Continued Medical Work up   Patient Goals and CMS Choice   CMS Medicare.gov Compare Post Acute Care list provided to:: Patient Choice offered to / list presented to : Patient, Adult Children  Expected Discharge Plan and Services Expected Discharge Plan: [place] arrangements for the past 2 months: Single Family Home                                      Prior Living Arrangements/Services Living arrangements for the past 2 months: Single Family Home Lives with:: Self Patient language and need for interpreter reviewed:: No        Need for Family Participation in Patient Care: Yes (Comment) Care giver support system in place?: No (comment)   Criminal Activity/Legal Involvement Pertinent to Current Situation/Hospitalization: No - Comment as needed  Activities of Daily Living      Permission Sought/Granted Permission sought to share information with : Chartered certified accountant granted to share information with : Yes, Verbal Permission Granted              Emotional Assessment       Orientation: : Oriented to Self, Oriented to Place, Oriented to  Time, Oriented to Situation Alcohol / Substance Use: Not Applicable Psych Involvement:  No (comment)  Admission diagnosis:  Subdural hematoma ([place]) [S06.5XAA] Patient Active Problem List   Diagnosis Date Noted   Subdural hematoma ([place]) 10/28/2021   Gangrene ([place]) 04/26/2017   Cellulitis 04/01/2017   Gangrene of toe ([place]) 04/01/2017   Anemia 04/01/2017   Hyperkalemia 04/01/2017   Renal insufficiency 04/01/2017   Type II diabetes mellitus with complication ([place]) 93/71/6967   Dry gangrene ([place]) 04/01/2017   Groin pain 07/04/2014   Peripheral vascular disease, unspecified ([place]) 06/27/2013   PAD (peripheral artery disease) ([place]) 12/27/2012   PVD (peripheral vascular disease) ([place]) 12/12/2012   Atherosclerosis of native arteries of the extremities with ulceration(440.23) 03/21/2012   Pain in limb 03/21/2012   PCP:  Manon Hilding, MD Pharmacy:   [facility] [address], [place] [address] Phone: [phone] Fax: [place], [place] [address] [place] [address] Phone: [phone] Fax: [phone]     Social Determinants of Health (SDOH) Interventions    Readmission Risk Interventions     No data to display

## 2021-10-30 NOTE — Evaluation (Signed)
Speech Language Pathology Evaluation Patient Details Name: James Dougherty MRN: [id number] DOB: 01-Aug-1950 Today's Date: 10/30/2021 Time: 6195-0932 SLP Time Calculation (min) (ACUTE ONLY): 20 min  Problem List:  Patient Active Problem List   Diagnosis Date Noted   Subdural hematoma (HCC) 10/28/2021   Gangrene (HCC) 04/26/2017   Cellulitis 04/01/2017   Gangrene of toe (HCC) 04/01/2017   Anemia 04/01/2017   Hyperkalemia 04/01/2017   Renal insufficiency 04/01/2017   Type II diabetes mellitus with complication (HCC) 04/01/2017   Dry gangrene (HCC) 04/01/2017   Groin pain 07/04/2014   Peripheral vascular disease, unspecified (HCC) 06/27/2013   PAD (peripheral artery disease) (HCC) 12/27/2012   PVD (peripheral vascular disease) (HCC) 12/12/2012   Atherosclerosis of native arteries of the extremities with ulceration(440.23) 03/21/2012   Pain in limb 03/21/2012   Past Medical History:  Past Medical History:  Diagnosis Date   Arthritis    "hands" (12/27/2012)   High cholesterol    Hypertension    Neuropathic pain    PAD (peripheral artery disease) (HCC)    Psoriasis    Stroke (HCC) 1999   "still have some speech problems at times; sometimes forget what I was going to say" (12/27/2012)   Type II diabetes mellitus (HCC)    Ulcer    Left ankle/leg   Past Surgical History:  Past Surgical History:  Procedure Laterality Date   ABDOMINAL AORTAGRAM N/A 03/30/2012   Procedure: ABDOMINAL Ronny Flurry;  Surgeon: Nada Libman, MD;  Location: [facility] CATH LAB;  Service: Cardiovascular;  Laterality: N/A;   ABDOMINAL AORTAGRAM N/A 12/27/2012   Procedure: ABDOMINAL Ronny Flurry;  Surgeon: Nada Libman, MD;  Location: [facility] CATH LAB;  Service: Cardiovascular;  Laterality: N/A;   ABDOMINAL AORTOGRAM W/LOWER EXTREMITY N/A 04/05/2017   Procedure: ABDOMINAL AORTOGRAM W/LOWER EXTREMITY;  Surgeon: Sherren Kerns, MD;  Location: [facility] INVASIVE CV LAB;  Service: Cardiovascular;  Laterality: N/A;   AMPUTATION Right  04/26/2017   Procedure: AMPUTATION TRANSMETATARSAL RIGHT GREAT TOE;  Surgeon: Larina Earthly, MD;  Location: [facility] OR;  Service: Vascular;  Laterality: Right;   ANGIOPLASTY / STENTING FEMORAL Left 12/27/2012   APPLICATION OF WOUND VAC Right 04/26/2017   Procedure: APPLICATION OF WOUND VAC;  Surgeon: Larina Earthly, MD;  Location: [facility] OR;  Service: Vascular;  Laterality: Right;   FEMORAL ARTERY STENT  03/30/2012   LOWER EXTREMITY ANGIOGRAM  12/27/2012   Procedure: LOWER EXTREMITY ANGIOGRAM;  Surgeon: Nada Libman, MD;  Location: [facility] CATH LAB;  Service: Cardiovascular;;   LOWER EXTREMITY ANGIOGRAPHY N/A 04/19/2018   Procedure: LOWER EXTREMITY ANGIOGRAPHY;  Surgeon: Nada Libman, MD;  Location: [facility] INVASIVE CV LAB;  Service: Cardiovascular;  Laterality: N/A;   PERIPHERAL VASCULAR ATHERECTOMY Right 04/05/2017   Procedure: PERIPHERAL VASCULAR ATHERECTOMY;  Surgeon: Sherren Kerns, MD;  Location[facility] INVASIVE CV LAB;  Service: Cardiovascular;  Laterality: Right;  superficial femoral   PERIPHERAL VASCULAR BALLOON ANGIOPLASTY  04/19/2018   Procedure: PERIPHERAL VASCULAR BALLOON ANGIOPLASTY;  Surgeon: Nada Libman, MD;  Location: [facility] INVASIVE CV LAB;  Service: Cardiovascular;;   PERIPHERAL VASCULAR INTERVENTION Right 04/05/2017   Procedure: PERIPHERAL VASCULAR INTERVENTION;  Surgeon: Sherren Kerns, MD;  Location: [facility] INVASIVE CV LAB;  Service: Cardiovascular;  Laterality: Right;   Superficial femorl and external iliac   POPLITEAL ARTERY STENT     TONSILLECTOMY AND ADENOIDECTOMY     HPI:  Pt is a 71 y.o. male brought in by EMS as a level 2 trauma after he fell and hit the  back of his head while he was at the tire shop with his son. CT head: left subdural hematoma as well as a trace subarachnoid hemorrhage in left sylvian fissure.  PMH: arthritis, hyperlipidemia, hypertension, neuropathic pain, PAD, psoriasis, stroke, diabetes.   Assessment / Plan / Recommendation Clinical Impression  Pt participated in  speech-language-cognition evaluation with his son present at the beginning of the evaluation. Pt reported having dysarthria from a prior CVA, but denied any other baseline deficits in speech, language, or cognition. The [facility] Mental Status Examination was completed to evaluate the pt's cognitive-linguistic skills. He achieved a score of 21/30 which is below the normal limits of 27 or more out of 30 and is suggestive of a mild impairment. He exhibited difficulty in the areas of awareness, attention, memory, and executive function. He also presented with mild dysarthria characterized by reduced articulatory precision, but pt reported this to be baseline. Skilled SLP services are clinically indicated at this time to improve cognitive-linguistic function.    SLP Assessment  SLP Recommendation/Assessment: Patient needs continued Speech Lanaguage Pathology Services SLP Visit Diagnosis: Cognitive communication deficit (R41.841);Dysarthria and anarthria (R47.1)    Recommendations for follow up therapy are one component of a multi-disciplinary discharge planning process, led by the attending physician.  Recommendations may be updated based on patient status, additional functional criteria and insurance authorization.    Follow Up Recommendations  Acute inpatient rehab (3hours/day)    Assistance Recommended at Discharge  Intermittent Supervision/Assistance  Functional Status Assessment Patient has had a recent decline in their functional status and demonstrates the ability to make significant improvements in function in a reasonable and predictable amount of time.  Frequency and Duration min 2x/week  2 weeks      SLP Evaluation Cognition  Overall Cognitive Status: Impaired/Different from baseline Arousal/Alertness: Awake/alert Orientation Level: Oriented X4 Year: 2023 Month: October Day of Week: Correct Attention: Focused;Sustained Focused Attention: Appears intact Sustained  Attention: Impaired Sustained Attention Impairment: Verbal complex Memory: Impaired Memory Impairment:  (Immediate: 5/5; delayed: 3/5; wioth cues: 2/2; paragraph: 6/8) Awareness: Impaired Awareness Impairment: Emergent impairment Problem Solving: Impaired Problem Solving Impairment: Verbal complex (time: 1/1; money: 1/3) Executive Function: Sequencing;Organizing Sequencing: Impaired Sequencing Impairment:  (clock: 4/4; verbal sequencing: prompts necessary to provision of all steps) Organizing: Impaired Organizing Impairment: Verbal complex (backward digit span: 1/2) [place] Scales of Cognitive Functioning: Automatic, Appropriate       Comprehension  Auditory Comprehension Overall Auditory Comprehension: Appears within functional limits for tasks assessed Yes/No Questions: Within Functional Limits Commands: Within Functional Limits Conversation: Complex Interfering Components: Working Research scientist (physical sciences) Expression Primary Mode of Expression: Verbal Verbal Expression Overall Verbal Expression: Appears within functional limits for tasks assessed Initiation: No impairment Level of Generative/Spontaneous Verbalization: Conversation Repetition: No impairment Naming: No impairment Pragmatics: No impairment   Oral / Surveyor, quantity Overall Motor Speech: Impaired at baseline Respiration: Within functional limits Phonation: Normal Articulation: Impaired Level of Impairment: Conversation Intelligibility: Intelligible Motor Planning: Witnin functional limits Motor Speech Errors: Aware;Consistent           Rhiannon Sassaman I. Hardin Negus, [place], [place] Office number [phone]  Horton Marshall 10/30/2021, 3:58 PM

## 2021-10-31 LAB — CBC
HCT: 35.7 % — ABNORMAL LOW (ref 39.0–52.0)
Hemoglobin: 11.6 g/dL — ABNORMAL LOW (ref 13.0–17.0)
MCH: 31.6 pg (ref 26.0–34.0)
MCHC: 32.5 g/dL (ref 30.0–36.0)
MCV: 97.3 fL (ref 80.0–100.0)
Platelets: 191 10*3/uL (ref 150–400)
RBC: 3.67 MIL/uL — ABNORMAL LOW (ref 4.22–5.81)
RDW: 14.7 % (ref 11.5–15.5)
WBC: 5.2 10*3/uL (ref 4.0–10.5)
nRBC: 0 % (ref 0.0–0.2)

## 2021-10-31 LAB — GLUCOSE, CAPILLARY
Glucose-Capillary: 135 mg/dL — ABNORMAL HIGH (ref 70–99)
Glucose-Capillary: 145 mg/dL — ABNORMAL HIGH (ref 70–99)
Glucose-Capillary: 121 mg/dL — ABNORMAL HIGH (ref 70–99)
Glucose-Capillary: 107 mg/dL — ABNORMAL HIGH (ref 70–99)
Glucose-Capillary: 114 mg/dL — ABNORMAL HIGH (ref 70–99)
Glucose-Capillary: 128 mg/dL — ABNORMAL HIGH (ref 70–99)

## 2021-10-31 LAB — BASIC METABOLIC PANEL
Anion gap: 6 (ref 5–15)
BUN: 25 mg/dL — ABNORMAL HIGH (ref 8–23)
CO2: 24 mmol/L (ref 22–32)
Calcium: 8.4 mg/dL — ABNORMAL LOW (ref 8.9–10.3)
Chloride: 106 mmol/L (ref 98–111)
Creatinine, Ser: 1.45 mg/dL — ABNORMAL HIGH (ref 0.61–1.24)
GFR, Estimated: 52 mL/min — ABNORMAL LOW (ref >60)
Glucose, Bld: 137 mg/dL — ABNORMAL HIGH (ref 70–99)
Potassium: 3.8 mmol/L (ref 3.5–5.1)
Sodium: 136 mmol/L (ref 135–145)

## 2021-10-31 MED ORDER — LEVETIRACETAM 500 MG PO TABS
500.0000 mg | ORAL_TABLET | Freq: Two times a day (BID) | ORAL | Status: AC
  Administered 2021-10-31 – 2021-11-04 (×8): 500 mg via ORAL
  Filled 2021-10-31 (×8): qty 1

## 2021-10-31 NOTE — Care Management Important Message (Signed)
Important Message  Patient Details  Name: RMANI KELLOGG MRN: [id number] Date of Birth: 12-02-1950   Medicare Important Message Given:  Yes     Hannah Beat 10/31/2021, 12:57 PM

## 2021-10-31 NOTE — Telephone Encounter (Signed)
Patients son called back stating that he would need to reschedule the 10/16 appointment because he does feel as if the patient would be able to come in for a visit since he is in the hospital and it has not yet been determined when he will be released. Informed son that appointment was scheduled incorrectly and that he is scheduled for Thursday, 11/16 @ 12:00 pm at the [place] location (per JB). Also informed that during that appointment, the patient will be reevaluated for possible cardiac cath. Son verbalized understanding.

## 2021-10-31 NOTE — Telephone Encounter (Signed)
Pt's son is calling back to get clarification on appts. He states he was not aware that heart cath had been cancelled for 11/07/21. Please advise.

## 2021-10-31 NOTE — Progress Notes (Signed)
[facility] Progress Note     Subjective: CC:  Sleeping up in the chair- easily arouses. Oriented x 3. C/o mild nausea. No vomiting. Denies pain.   Objective: Vital signs in last 24 hours: Temp:  [97.2 F (36.2 C)-98.7 F (37.1 C)] 97.4 F (36.3 C) (10/06 0746) Pulse Rate:  [66-86] 66 (10/06 0746) Resp:  [13-17] 17 (10/06 0746) BP: (112-147)/(47-131) 133/64 (10/06 0746) SpO2:  [90 %-100 %] 93 % (10/06 0746) Last BM Date : 10/30/21  Intake/Output from previous day: 10/05 0701 - 10/06 0700 In: 813 [P.O.:450; I.V.:262.7; IV Piggyback:100.3] Out: -  Intake/Output this shift: No intake/output data recorded.  PE: Gen: comfortable, no distress Neuro: non-focal exam HEENT: PERRL Neck: supple CV: RRR Pulm: unlabored breathing Abd: soft, NT Extr: wwp, no edema    Lab Results:  Recent Labs    10/30/21 0318 10/31/21 0316  WBC 8.9 5.2  HGB 12.8* 11.6*  HCT 39.0 35.7*  PLT 231 191   BMET Recent Labs    10/30/21 0318 10/31/21 0316  NA 138 136  K 4.4 3.8  CL 107 106  CO2 20* 24  GLUCOSE 112* 137*  BUN 27* 25*  CREATININE 1.40* 1.45*  CALCIUM 8.4* 8.4*   PT/INR Recent Labs    10/28/21 1436  LABPROT 14.0  INR 1.1   CMP     Component Value Date/Time   NA 136 10/31/2021 0316   K 3.8 10/31/2021 0316   CL 106 10/31/2021 0316   CO2 24 10/31/2021 0316   GLUCOSE 137 (H) 10/31/2021 0316   BUN 25 (H) 10/31/2021 0316   CREATININE 1.45 (H) 10/31/2021 0316   CALCIUM 8.4 (L) 10/31/2021 0316   PROT 6.3 (L) 10/28/2021 1436   ALBUMIN 3.5 10/28/2021 1436   AST 27 10/28/2021 1436   ALT 19 10/28/2021 1436   ALKPHOS 61 10/28/2021 1436   BILITOT 1.2 10/28/2021 1436   GFRNONAA 52 (L) 10/31/2021 0316   GFRAA 47 (L) 04/28/2017 0238   Lipase  No results found for: "LIPASE"     Studies/Results: VAS US CAROTID  Result Date: 10/30/2021 Carotid Arterial Duplex Study Patient Name:  James Dougherty [facility]  Date of Exam:   10/29/2021 Medical Rec #: [id number]         Accession #:    [id number] Date of Birth: Feb 07, 1950        Patient Gender: M Patient Age:   71 years Exam Location:  [facility] Procedure:      VAS US CAROTID Referring Phys: Scheryl Marten XU --------------------------------------------------------------------------------  Indications:       CVA. Risk Factors:      Hypertension, hyperlipidemia, Diabetes, prior CVA, PAD. Comparison Study:  02/06/21 prior Performing Technologist: Argentina Ponder RVS  Examination Guidelines: A complete evaluation includes B-mode imaging, spectral Doppler, color Doppler, and power Doppler as needed of all accessible portions of each vessel. Bilateral testing is considered an integral part of a complete examination. Limited examinations for reoccurring indications may be performed as noted.  Right Carotid Findings: +---------+--------+-------+--------+---------------------------------+--------+          PSV cm/sEDV    StenosisPlaque Description               Comments                  cm/s                                                     +---------+--------+-------+--------+---------------------------------+--------+  CCA Prox 60      13             heterogenous                              +---------+--------+-------+--------+---------------------------------+--------+ CCA      90      10             heterogenous                              Distal                                                                    +---------+--------+-------+--------+---------------------------------+--------+ ICA Prox 440     148    80-99%  heterogenous, irregular and                                               calcific                                  +---------+--------+-------+--------+---------------------------------+--------+ ICA Mid  310     73                                                       +---------+--------+-------+--------+---------------------------------+--------+ ICA       67      21                                                       Distal                                                                    +---------+--------+-------+--------+---------------------------------+--------+ ECA      276                                                              +---------+--------+-------+--------+---------------------------------+--------+ +----------+--------+-------+--------+-------------------+           PSV cm/sEDV cmsDescribeArm Pressure (mmHG) +----------+--------+-------+--------+-------------------+ Subclavian110                                        +----------+--------+-------+--------+-------------------+ +---------+--------+--+--------+--+  VertebralPSV cm/s36EDV cm/s12 +---------+--------+--+--------+--+  Left Carotid Findings: +----------+--------+--------+--------+--------------------------+--------+           PSV cm/sEDV cm/sStenosisPlaque Description        Comments +----------+--------+--------+--------+--------------------------+--------+ CCA Prox  91      19              heterogenous                       +----------+--------+--------+--------+--------------------------+--------+ CCA Distal93      20              heterogenous                       +----------+--------+--------+--------+--------------------------+--------+ ICA Prox  217     68      60-79%  heterogenous and irregular         +----------+--------+--------+--------+--------------------------+--------+ ICA Mid   143     38                                                 +----------+--------+--------+--------+--------------------------+--------+ ICA Distal72      20                                                 +----------+--------+--------+--------+--------------------------+--------+ ECA       132                                                         +----------+--------+--------+--------+--------------------------+--------+ +----------+--------+--------+--------+-------------------+           PSV cm/sEDV cm/sDescribeArm Pressure (mmHG) +----------+--------+--------+--------+-------------------+ [id number]                                         +----------+--------+--------+--------+-------------------+ +---------+--------+--+--------+-+---------+ VertebralPSV cm/s31EDV cm/s8Antegrade +---------+--------+--+--------+-+---------+   Summary: Right Carotid: Velocities in the right ICA are consistent with a 80-99%                stenosis. Left Carotid: Velocities in the left ICA are consistent with a 60-79% stenosis. Vertebrals: Bilateral vertebral arteries demonstrate antegrade flow. *See table(s) above for measurements and observations.  Electronically signed by Delia Heady MD on 10/30/2021 at 5:27:58 PM.    Final     Anti-infectives: Anti-infectives (From admission, onward)    None        Assessment/Plan    Fall TBI/1.1cm L SDH with 33mm midline shift, 91mm R SDH - per Dr. Maisie Fus whom is following from neurosurgery. DDAVP, F/U CT head 10/4. CT unchanged; Keppra x7d for sz ppx. Repeat CT head in 2 weeks and outpatient follow up. Continue to hold ASA and plavix until this visit. Chronic LE wounds - WOC eval - appreciate assistance; was daily, xeroform dressings. HTN - hold home meds, lopressor PRN; has been mostly normotensive here off of his meds. DM2 - SSI Elevated creatinine - possibly some chronicity, down-trending, monitor VTE - PAS FEN - carb mod; decrease IVF from 100 mL/hr to 50  mL/hr, continue given nausea and decreased PO intake Dispo:progressive care, therapies  Appreciate CM working on SNF placement   LOS: 3 days   I reviewed nursing notes, Consultant neurosurgery, neurology notes, last 24 h vitals and pain scores, last 48 h intake and output, last 24 h labs and trends, and last 24 h imaging  results.   Obie Dredge, PA-C [facility] Please see Amion for pager number during day hours 7:00am-4:30pm

## 2021-10-31 NOTE — Telephone Encounter (Signed)
Patient is currently admitted to [facility] for subdural hematoma.

## 2021-10-31 NOTE — Telephone Encounter (Signed)
[name] notified & verbalized understanding.  Cath lab called to cancel cath for 11/07/2021.    OV scheduled for 12/11/2021 at noon with Dr. Harl Bowie in [place] office.

## 2021-10-31 NOTE — Progress Notes (Signed)
Patient has been more drowsy today compared to yesterday. Patient asked why he was so sleepy today because he slept through the morning. Daughter worried about s/s and this nurse confirmed with the son that patient is okay but [place]. Patient BP sat in the 150s today after being WDL in the morning. Patient felt the left side of his face and left arm become tingly around 1630 and this nurse administered labetalol after reassessing VS and bp was 152/77. This nurse reassessed 45 minutes later and BP came down to 144/73 along with decreased tingling sensation. WCTM.    Shelbie Proctor, RN

## 2021-10-31 NOTE — Plan of Care (Signed)

## 2021-10-31 NOTE — Progress Notes (Signed)
Physical Therapy Treatment Patient Details Name: James Dougherty MRN: [id number] DOB: 03-02-1950 Today's Date: 10/31/2021   History of Present Illness 71 y.o. male brought in by EMS as a level 2 trauma after he fell and hit the back of his head while he was at the tire shop with his son. CT head + left subdural hematoma as well as a trace subarachnoid hemorrhage in left sylvian fissure.  Past medical history of arthritis, hyperlipidemia, hypertension, neuropathic pain, PAD, psoriasis, stroke, diabetes    PT Comments    Patient progressing, though BPPV remains.  Was lethargic initially, but reported had medication for nausea, though denied dizziness.  Positive in L sidelying for downbeating nystagmus likely L anterior canal.  Needs repeat treatment with +2 A.  PT will continue to follow.  Still may benefit from acute inpatient rehab for caregiver education and improved balance/safety prior to d/c to lower fall risk.   Recommendations for follow up therapy are one component of a multi-disciplinary discharge planning process, led by the attending physician.  Recommendations may be updated based on patient status, additional functional criteria and insurance authorization.  Follow Up Recommendations  Acute inpatient rehab (3hours/day)     Assistance Recommended at Discharge Frequent or constant Supervision/Assistance  Patient can return home with the following A little help with walking and/or transfers;A little help with bathing/dressing/bathroom;Assistance with cooking/housework;Direct supervision/assist for medications management;Direct supervision/assist for financial management;Assist for transportation;Help with stairs or ramp for entrance   Equipment Recommendations  Other (comment) (TBA)    Recommendations for Other Services       Precautions / Restrictions Precautions Precautions: Fall Precaution Comments: x2 recent falls     Mobility  Bed Mobility Overal bed mobility: Needs  Assistance Bed Mobility: Sidelying to Sit, Sit to Sidelying   Sidelying to sit: Mod assist     Sit to sidelying: Mod assist General bed mobility comments: re-test with sidelying test for BPPV, noted in L modified hallpike pt with downbeat nystagmus.  Needing assist for trunk and legs to side and back to sit    Transfers Overall transfer level: Needs assistance Equipment used: Rolling walker (2 wheels) Transfers: Sit to/from Stand Sit to Stand: Min assist           General transfer comment: slow to rise initially with some lifting help after was asleep in chair    Ambulation/Gait Ambulation/Gait assistance: Min assist Gait Distance (Feet): 150 Feet Assistive device: Rolling walker (2 wheels) Gait Pattern/deviations: Step-through pattern, Decreased stride length, Shuffle, Trunk flexed       General Gait Details: standing rest pt relates to "catch his breath" needing assist when backing to toilet in bathroom and to the bed/chair in the room with keeping RW close and for balance; still with short shuffling steps   Stairs             Wheelchair Mobility    Modified Rankin (Stroke Patients Only)       Balance Overall balance assessment: Needs assistance Sitting-balance support: Feet supported Sitting balance-Leahy Scale: Good Sitting balance - Comments: leaning over on toilet no LOB   Standing balance support: No upper extremity supported, During functional activity Standing balance-Leahy Scale: Poor Standing balance comment: min to mod A for balance when no UE support while blowing his nose in the bathroom with ant/post movement, unable to accommodate with his ankles  Cognition Arousal/Alertness: Awake/alert Behavior During Therapy: Flat affect Overall Cognitive Status: Impaired/Different from baseline Area of Impairment: Safety/judgement, Attention, Problem solving               Rancho Levels of Cognitive  Functioning Rancho Los Amigos Scales of Cognitive Functioning: Automatic, Appropriate   Current Attention Level: Sustained Memory: Decreased short-term memory   Safety/Judgement: Decreased awareness of deficits   Problem Solving: Slow processing, Difficulty sequencing, Requires verbal cues     Rancho [employer] Scales of Cognitive Functioning: Automatic, Appropriate    Exercises      General Comments General comments (skin integrity, edema, etc.): Tested for BPPV with modified hallpike L sidelying test positive for downbeating nystagmus.  Likely anterior canal on L though treated for posterior canal last session,  Will need repeat treatment, but need +2 A      Pertinent Vitals/Pain Pain Assessment Faces Pain Scale: No hurt    Home Living                          Prior Function            PT Goals (current goals can now be found in the care plan section) Progress towards PT goals: Progressing toward goals    Frequency    Min 4X/week      PT Plan Current plan remains appropriate    Co-evaluation              AM-PAC PT "6 Clicks" Mobility   Outcome Measure  Help needed turning from your back to your side while in a flat bed without using bedrails?: A Little Help needed moving from lying on your back to sitting on the side of a flat bed without using bedrails?: A Lot Help needed moving to and from a bed to a chair (including a wheelchair)?: A Little Help needed standing up from a chair using your arms (e.g., wheelchair or bedside chair)?: A Little Help needed to walk in hospital room?: A Little Help needed climbing 3-5 steps with a railing? : Total 6 Click Score: 15    End of Session Equipment Utilized During Treatment: Gait belt Activity Tolerance: Patient tolerated treatment well Patient left: in chair;with call bell/phone within reach   PT Visit Diagnosis: Unsteadiness on feet (R26.81);Other abnormalities of gait and mobility  (R26.89);Repeated falls (R29.6);Other symptoms and signs involving the nervous system (R29.898);BPPV BPPV - Right/Left : Left     Time: 9675-9163 PT Time Calculation (min) (ACUTE ONLY): 29 min  Charges:  $Gait Training: 8-22 mins $Therapeutic Activity: 8-22 mins                     Magda Kiel, PT Acute Rehabilitation Services Office:[phone] 10/31/2021    Reginia Naas 10/31/2021, 1:44 PM

## 2021-10-31 NOTE — Telephone Encounter (Signed)
Cath needs to be cancelled, can reassess at outpatient f/u if able to proceed. Needs f/u 6 weeks please   Zandra Abts MD

## 2021-11-01 LAB — GLUCOSE, CAPILLARY
Glucose-Capillary: 108 mg/dL — ABNORMAL HIGH (ref 70–99)
Glucose-Capillary: 131 mg/dL — ABNORMAL HIGH (ref 70–99)
Glucose-Capillary: 148 mg/dL — ABNORMAL HIGH (ref 70–99)
Glucose-Capillary: 126 mg/dL — ABNORMAL HIGH (ref 70–99)
Glucose-Capillary: 149 mg/dL — ABNORMAL HIGH (ref 70–99)
Glucose-Capillary: 115 mg/dL — ABNORMAL HIGH (ref 70–99)

## 2021-11-01 LAB — BASIC METABOLIC PANEL
Anion gap: 8 (ref 5–15)
BUN: 21 mg/dL (ref 8–23)
CO2: 24 mmol/L (ref 22–32)
Calcium: 8.8 mg/dL — ABNORMAL LOW (ref 8.9–10.3)
Chloride: 105 mmol/L (ref 98–111)
Creatinine, Ser: 1.29 mg/dL — ABNORMAL HIGH (ref 0.61–1.24)
GFR, Estimated: 59 mL/min — ABNORMAL LOW (ref >60)
Glucose, Bld: 117 mg/dL — ABNORMAL HIGH (ref 70–99)
Potassium: 4 mmol/L (ref 3.5–5.1)
Sodium: 137 mmol/L (ref 135–145)

## 2021-11-01 MED ORDER — ENSURE ENLIVE PO LIQD
237.0000 mL | Freq: Two times a day (BID) | ORAL | Status: DC
  Administered 2021-11-01 – 2021-11-02 (×4): 237 mL via ORAL

## 2021-11-01 MED ORDER — GERHARDT'S BUTT CREAM
TOPICAL_CREAM | Freq: Two times a day (BID) | CUTANEOUS | Status: DC
  Administered 2021-11-01 – 2021-11-03 (×3): 1 via TOPICAL
  Filled 2021-11-01: qty 1

## 2021-11-01 NOTE — Progress Notes (Addendum)
[facility] Progress Note     Subjective: CC:  Sitting up in chair - states he feels better today. Denies nausea or vomiting. Reports poor appetite. Agreeable to ensure. Working with therapies. +BM yesterday.  Objective: Vital signs in last 24 hours: Temp:  [96.2 F (35.7 C)-97.6 F (36.4 C)] 97.6 F (36.4 C) (10/07 0729) Pulse Rate:  [64-93] 93 (10/07 0729) Resp:  [12-18] 16 (10/07 0729) BP: (116-164)/(66-88) 145/69 (10/07 0729) SpO2:  [93 %-95 %] 95 % (10/07 0729) Last BM Date : 10/30/21  Intake/Output from previous day: No intake/output data recorded. Intake/Output this shift: Total I/O In: -  Out: 525 [Urine:525]  PE: Gen: comfortable, no distress Neuro: non-focal exam HEENT: PERRL Neck: supple CV: RRR Pulm: unlabored breathing Abd: soft, NT Extr: wwp, no edema    Lab Results:  Recent Labs    10/30/21 0318 10/31/21 0316  WBC 8.9 5.2  HGB 12.8* 11.6*  HCT 39.0 35.7*  PLT 231 191   BMET Recent Labs    10/31/21 0316 11/01/21 0230  NA 136 137  K 3.8 4.0  CL 106 105  CO2 24 24  GLUCOSE 137* 117*  BUN 25* 21  CREATININE 1.45* 1.29*  CALCIUM 8.4* 8.8*   PT/INR No results for input(s): "LABPROT", "INR" in the last 72 hours.  CMP     Component Value Date/Time   NA 137 11/01/2021 0230   K 4.0 11/01/2021 0230   CL 105 11/01/2021 0230   CO2 24 11/01/2021 0230   GLUCOSE 117 (H) 11/01/2021 0230   BUN 21 11/01/2021 0230   CREATININE 1.29 (H) 11/01/2021 0230   CALCIUM 8.8 (L) 11/01/2021 0230   PROT 6.3 (L) 10/28/2021 1436   ALBUMIN 3.5 10/28/2021 1436   AST 27 10/28/2021 1436   ALT 19 10/28/2021 1436   ALKPHOS 61 10/28/2021 1436   BILITOT 1.2 10/28/2021 1436   GFRNONAA 59 (L) 11/01/2021 0230   GFRAA 47 (L) 04/28/2017 0238   Lipase  No results found for: "LIPASE"     Studies/Results: No results found.  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 Marcello Moores whom is following from neurosurgery. DDAVP, F/U CT head 10/4. CT unchanged; Keppra x7d for sz ppx. Repeat CT head in 2 weeks and outpatient follow up. Continue to hold ASA and plavix until this visit. Chronic LE wounds - WOC eval - appreciate assistance; was daily, xeroform dressings. HTN - hold home meds, lopressor PRN; has been mostly normotensive here off of his meds. DM2 - SSI Elevated creatinine - possibly some chronicity, down-trending, monitor VTE - PAS FEN - carb mod; SLIV, add ensure   Dispo:progressive care, therapies  Appreciate CM working on SNF placement - medically stable for discharge.   Addendum - family re-considering CIR, CIR re-consulted    LOS: 4 days   I reviewed nursing notes, Consultant neurosurgery, neurology notes, last 24 h vitals and pain scores, last 48 h intake and output, last 24 h labs and trends, and last 24 h imaging results.   Obie Dredge, PA-C [facility] Please see Amion for pager number during day hours 7:00am-4:30pm

## 2021-11-01 NOTE — Progress Notes (Addendum)
Inpatient Rehab Admissions:  Inpatient Rehab Consult received.  I met with patient at the bedside for rehabilitation assessment and to discuss goals and expectations of an inpatient rehab admission.  Discussed insurance authorization requirement for his insurance, average length of stay, discharge home after completion of program. Pt acknowledged understanding. Pt interested in pursuing CIR. Pt gave permission to contact daughter [name]. Spoke with [name] on the telephone. Discussed CIR goals and expectations along with visitation policy. She acknowledged understanding. She is supportive of pt pursuing CIR. She confirmed that she will be able to provide 24/7 support for pt after discharge. Will continue to follow.   ADDENDUM 1719: Insurance authorization started.   Signed: Gayland Curry, [place], [place] Admissions Coordinator [phone]

## 2021-11-01 NOTE — Progress Notes (Signed)
Occupational Therapy Treatment Patient Details Name: James Dougherty MRN: [id number] DOB: Jun 09, 1950 Today's Date: 11/01/2021   History of present illness 71 y.o. male brought in by EMS as a level 2 trauma after he fell and hit the back of his head while he was at the tire shop with his son. CT head + left subdural hematoma as well as a trace subarachnoid hemorrhage in left sylvian fissure.  Past medical history of arthritis, hyperlipidemia, hypertension, neuropathic pain, PAD, psoriasis, stroke, diabetes   OT comments  James Dougherty is progressing well. He required minG for mobility without dizziness this date. Extreme head turns and rolling not assessed this date. He continues to require min A for LB ADLs due to RUE functional deficits. Overall he benefited from significantly increased time and cues for safety and energy conservation. OT to continue to follow. POC remains appropriate.    Recommendations for follow up therapy are one component of a multi-disciplinary discharge planning process, led by the attending physician.  Recommendations may be updated based on patient status, additional functional criteria and insurance authorization.    Follow Up Recommendations  Acute inpatient rehab (3hours/day)    Assistance Recommended at Discharge Frequent or constant Supervision/Assistance  Patient can return home with the following  A little help with walking and/or transfers;A little help with bathing/dressing/bathroom;Assistance with cooking/housework;Direct supervision/assist for medications management;Direct supervision/assist for financial management;Assist for transportation;Help with stairs or ramp for entrance   Equipment Recommendations  None recommended by OT       Precautions / Restrictions Precautions Precautions: Fall Precaution Comments: x2 recent falls Restrictions Weight Bearing Restrictions: No       Mobility Bed Mobility               General bed mobility comments: OOB  upon arrival    Transfers Overall transfer level: Needs assistance Equipment used: Rolling walker (2 wheels) Transfers: Sit to/from Stand Sit to Stand: Min guard                 Balance Overall balance assessment: Needs assistance Sitting-balance support: Feet supported Sitting balance-Leahy Scale: Good     Standing balance support: During functional activity, Single extremity supported Standing balance-Leahy Scale: Fair Standing balance comment: can statically stand wtih only 1 UE supported                           ADL either performed or assessed with clinical judgement   ADL Overall ADL's : Needs assistance/impaired                         Toilet Transfer: Min guard;Rolling walker (2 wheels) Toilet Transfer Details (indicate cue type and reason): standing Toileting- Clothing Manipulation and Hygiene: Minimal assistance Toileting - Clothing Manipulation Details (indicate cue type and reason): assist required for clothign mgmt due to RUE     Functional mobility during ADLs: Min guard;Cueing for safety General ADL Comments: significantly increased time for all tasks required. limited by decreased activity tolerance adn RUE FM    Extremity/Trunk Assessment Upper Extremity Assessment Upper Extremity Assessment: RUE deficits/detail RUE Deficits / Details: ROM overall WFL; overall weaker than LUE, however using funciotnally very slowly. Reports worsened "tingling" with RW management RUE Coordination: decreased fine motor   Lower Extremity Assessment Lower Extremity Assessment: Defer to PT evaluation        Vision   Additional Comments: overall WFL for tasks assessed this date. no nystagmus noted  with transitional movements   Perception Perception Perception: Within Functional Limits   Praxis Praxis Praxis-Other Comments: intact    Cognition Arousal/Alertness: Awake/alert Behavior During Therapy: Flat affect Overall Cognitive Status:  Impaired/Different from baseline Area of Impairment: Safety/judgement, Attention, Problem solving                   Current Attention Level: Sustained Memory: Decreased short-term memory   Safety/Judgement: Decreased awareness of deficits Awareness: Emergent Problem Solving: Slow processing, Difficulty sequencing, Requires verbal cues General Comments: oriented. slow processing, limited insight.              General Comments VSS on RA. HR to 110 with mobility    Pertinent Vitals/ Pain       Pain Assessment Pain Assessment: Faces Faces Pain Scale: No hurt Pain Intervention(s): Monitored during session  Home Living   Living Arrangements: Alone Available Help at Discharge: Family;Available 24 hours/day Type of Home: House Home Access: Stairs to enter [employer] of Steps: 1-2 Entrance Stairs-Rails:  (grab bar on right side) Home Layout: One level     Bathroom Shower/Tub: Teacher, early years/pre: Handicapped height Bathroom Accessibility: No (Pt reports walker may possibly fit in bathroom if turned sideways. He reports that he leaves walker outside bathroom.)                  Frequency  Min 2X/week        Progress Toward Goals  OT Goals(current goals can now be found in the care plan section)  Progress towards OT goals: Progressing toward goals  Acute Rehab OT Goals Patient Stated Goal: to rehab OT Goal Formulation: With patient Time For Goal Achievement: 11/12/21 Potential to Achieve Goals: Good ADL Goals Pt Will Perform Lower Body Bathing: with modified independence;sit to/from stand Pt Will Perform Lower Body Dressing: with modified independence;sit to/from stand Pt Will Transfer to Toilet: with modified independence;ambulating Pt Will Perform Toileting - Clothing Manipulation and hygiene: with modified independence Additional ADL Goal #1: Pt will independently verbalize 3 strategies to reduce risk of falls  Plan  Discharge plan remains appropriate    Co-evaluation                 AM-PAC OT "6 Clicks" Daily Activity     Outcome Measure   Help from another person eating meals?: A Little Help from another person taking care of personal grooming?: A Little Help from another person toileting, which includes using toliet, bedpan, or urinal?: A Little Help from another person bathing (including washing, rinsing, drying)?: A Little Help from another person to put on and taking off regular upper body clothing?: A Little Help from another person to put on and taking off regular lower body clothing?: A Little 6 Click Score: 18    End of Session Equipment Utilized During Treatment: Gait belt;Rolling walker (2 wheels)  OT Visit Diagnosis: Unsteadiness on feet (R26.81);Other abnormalities of gait and mobility (R26.89);Muscle weakness (generalized) (M62.81);History of falling (Z91.81);Other symptoms and signs involving cognitive function;Other symptoms and signs involving the nervous system (R29.898);Dizziness and giddiness (R42)   Activity Tolerance Patient tolerated treatment well   Patient Left in chair;with call bell/phone within reach   Nurse Communication Mobility status;Other (comment)        Time: 2355-7322 OT Time Calculation (min): 21 min  Charges: OT General Charges $OT Visit: 1 Visit OT Treatments $Therapeutic Activity: 8-22 mins   Elliot Cousin 11/01/2021, 3:57 PM

## 2021-11-01 NOTE — Progress Notes (Signed)
Physical Therapy Treatment Patient Details Name: James Dougherty MRN: [id number] DOB: [birth year] Today's Date: 11/01/2021   History of Present Illness 71 y.o. male brought in by EMS as a level 2 trauma after he fell and hit the back of his head while he was at the tire shop with his son. CT head + left subdural hematoma as well as a trace subarachnoid hemorrhage in left sylvian fissure.  Past medical history of arthritis, hyperlipidemia, hypertension, neuropathic pain, PAD, psoriasis, stroke, diabetes    PT Comments    Patient progressing though with persistent down beat mild L rotary nystagmus with L head turn during Eply with concern for cupulolithiasis.  He also had trouble wit +1 A to keep the head turned L during last position.  Feel will continue to benefit from treatment of his vertigo in addition to work on balance and safety prior to d/c home.  Acute inpatient rehab recommendations remains appropriate.  PT will continue to follow.    Recommendations for follow up therapy are one component of a multi-disciplinary discharge planning process, led by the attending physician.  Recommendations may be updated based on patient status, additional functional criteria and insurance authorization.  Follow Up Recommendations  Acute inpatient rehab (3hours/day)     Assistance Recommended at Discharge Frequent or constant Supervision/Assistance  Patient can return home with the following A little help with walking and/or transfers;A little help with bathing/dressing/bathroom;Assistance with cooking/housework;Direct supervision/assist for medications management;Direct supervision/assist for financial management;Assist for transportation;Help with stairs or ramp for entrance   Equipment Recommendations  Other (comment) (TBA)    Recommendations for Other Services       Precautions / Restrictions Precautions Precautions: Fall Precaution Comments: x2 recent falls Restrictions Weight Bearing  Restrictions: No     Mobility  Bed Mobility Overal bed mobility: Needs Assistance Bed Mobility: Sidelying to Sit, Rolling Rolling: Mod assist     Sit to supine: Min assist Sit to sidelying: Mod assist General bed mobility comments: assist to supine for BPPV treatment needing A for legs and trunk positioning; rolling for Eply with mod A using bed pad and max cues for head to L; side to sit heavy lifting help for legs off bed and trunk    Transfers Overall transfer level: Needs assistance Equipment used: Rolling walker (2 wheels) Transfers: Sit to/from Stand Sit to Stand: Min guard                Ambulation/Gait Ambulation/Gait assistance: Min guard Gait Distance (Feet): 100 Feet (x 2 with standing rest) Assistive device: Rolling walker (2 wheels) Gait Pattern/deviations: Step-through pattern, Decreased stride length, Shuffle, Trunk flexed       General Gait Details: cues for posture, foot clearance, stopped x 2 for resting arms and catch his breath   Stairs             Wheelchair Mobility    Modified Rankin (Stroke Patients Only) Modified Rankin (Stroke Patients Only) Pre-Morbid Rankin Score: No significant disability Modified Rankin: Moderately severe disability     Balance Overall balance assessment: Needs assistance   Sitting balance-Leahy Scale: Good     Standing balance support: During functional activity, Single extremity supported Standing balance-Leahy Scale: Poor Standing balance comment: UE support or minguard needed (rested hands off walker and noted increased sway with minguard for safety)                            Cognition Arousal/Alertness: Awake/alert Behavior  During Therapy: Flat affect Overall Cognitive Status: Impaired/Different from baseline Area of Impairment: Safety/judgement, Attention, Problem solving               Rancho Levels of Cognitive Functioning Rancho Los Amigos Scales of Cognitive Functioning:  Automatic, Appropriate   Current Attention Level: Sustained Memory: Decreased short-term memory   Safety/Judgement: Decreased awareness of deficits Awareness: Emergent Problem Solving: Slow processing, Difficulty sequencing, Requires verbal cues     Rancho [employer] Scales of Cognitive Functioning: Automatic, Appropriate    Exercises      General Comments General comments (skin integrity, edema, etc.): VSS on RA, HR 96 with mobility; completed R Eply for L anterior canal BPPV, but noted extended time with down beating nystagmus with L head turn      Pertinent Vitals/Pain Pain Assessment Pain Assessment: No/denies pain    Home Living   Living Arrangements: Alone Available Help at Discharge: Family;Available 24 hours/day Type of Home: House Home Access: Stairs to enter Entrance Stairs-Rails:  (grab bar on right side) Entrance Stairs-Number of Steps: 1-2   Home Layout: One level        Prior Function            PT Goals (current goals can now be found in the care plan section) Progress towards PT goals: Progressing toward goals    Frequency    Min 4X/week      PT Plan Current plan remains appropriate    Co-evaluation              AM-PAC PT "6 Clicks" Mobility   Outcome Measure  Help needed turning from your back to your side while in a flat bed without using bedrails?: A Lot Help needed moving from lying on your back to sitting on the side of a flat bed without using bedrails?: Total Help needed moving to and from a bed to a chair (including a wheelchair)?: A Little Help needed standing up from a chair using your arms (e.g., wheelchair or bedside chair)?: A Little Help needed to walk in hospital room?: A Little Help needed climbing 3-5 steps with a railing? : Total 6 Click Score: 13    End of Session Equipment Utilized During Treatment: Gait belt Activity Tolerance: Patient limited by fatigue Patient left: in chair;with call bell/phone within  reach;with family/visitor present   PT Visit Diagnosis: Unsteadiness on feet (R26.81);Other abnormalities of gait and mobility (R26.89);Repeated falls (R29.6);Other symptoms and signs involving the nervous system (R29.898);BPPV BPPV - Right/Left : Left     Time: 7169-6789 PT Time Calculation (min) (ACUTE ONLY): 27 min  Charges:  $Gait Training: 8-22 mins $Canalith Rep Proc: 8-22 mins                     Magda Kiel, PT Acute Rehabilitation Services Office:[phone] 11/01/2021    Reginia Naas 11/01/2021, 4:05 PM

## 2021-11-01 NOTE — Progress Notes (Signed)
Subjective: Patient reports that he is feeling much better today. Yesterday, he reports sleeping most of the day. NAE ON.   Objective: Vital signs in last 24 hours: Temp:  [96.2 F (35.7 C)-97.6 F (36.4 C)] 97.6 F (36.4 C) (10/07 0729) Pulse Rate:  [64-93] 93 (10/07 0729) Resp:  [12-18] 16 (10/07 0729) BP: (116-164)/(66-88) 145/69 (10/07 0729) SpO2:  [93 %-95 %] 95 % (10/07 0729)  Intake/Output from previous day: No intake/output data recorded. Intake/Output this shift: Total I/O In: -  Out: 525 [Urine:525]  Physical Exam: Patient is awake, A/O X 4, conversant, and in good spirits. Eyes open spontaneously. They are in NAD and VSS. Able to follow commands without difficulty. Speech is fluent and appropriate. Mild dysarthria persists, improving. MAEW. BUE 5/5 throughout, BLE 5/5 throughout except proximal RLE 4/5 and distal RLE 4+/5. No drift. Sensation to light touch is decreased in BLE. PERLA, EOMI. CNs grossly intact except CN VII, slight left-sided nasolabial fold flattening.  Lab Results: Recent Labs    10/30/21 0318 10/31/21 0316  WBC 8.9 5.2  HGB 12.8* 11.6*  HCT 39.0 35.7*  PLT 231 191   BMET Recent Labs    10/31/21 0316 11/01/21 0230  NA 136 137  K 3.8 4.0  CL 106 105  CO2 24 24  GLUCOSE 137* 117*  BUN 25* 21  CREATININE 1.45* 1.29*  CALCIUM 8.4* 8.8*    Studies/Results: No results found.  Assessment/Plan: 71 y.o. male who presented to the [facility] following a fall. CT head reveled an acute left cerebral convexity SDH measuring up to 1.1 cm resulting in partial effacement of the left lateral ventricle with approximately 5 mm rightward midline shift, small right cerebral convexity SDH measuring up to 3-4 mm in thickness, and trace SAH in the left sylvian fissure. He was given DDAVP and a platelet transfusion while in the ED. His neurological exam initially revealed lethargy with minimal RLE weakness. Neurological status is stable to slightly improved. He was  reported to have increased letheragy yesterday but appears appropriate this morning. Plan for SNF once medically ready. Continue supportive care.    -Keppra 500 mg BID X 7 days -Pharmacologic DVT prophylaxis -Continue therapy -Repeat CT head in 2 weeks with outpatient follow up      LOS: 4 days     Marvis Moeller, DNP, AGNP-C Neurosurgery Nurse Practitioner  [facility] [address], [address], [place], [place] [address] P: [phone]    F: [phone]  11/01/2021 9:37 AM

## 2021-11-01 NOTE — Progress Notes (Addendum)
Per MD, pt medically stable for dc to SNF. [place] auth request submitted for [employer] (ref # [id number]). Anticipate dc Monday. SW will follow and assist as indicated.   UPDATE 1300: pt's dtr [name] reports pt/family reconsidering CIR and request consult. Lauren with CIR aware and reports they will submit for auth. Candace Cruise request previously submitted for SNF (ref # [id number]) has been cancelled. Will need to submit new auth for SNF if CIR is denied.   Wandra Feinstein, MSW, LCSW [phone] (coverage)

## 2021-11-01 NOTE — Progress Notes (Addendum)
Patient sates he feels better today than yesterday. 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 Washed and given barrier cream as intervention.   Shelbie Proctor, RN
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
## 2021-11-02 LAB — GLUCOSE, CAPILLARY
Glucose-Capillary: 114 mg/dL — ABNORMAL HIGH (ref 70–99)
Glucose-Capillary: 150 mg/dL — ABNORMAL HIGH (ref 70–99)
Glucose-Capillary: 141 mg/dL — ABNORMAL HIGH (ref 70–99)
Glucose-Capillary: 131 mg/dL — ABNORMAL HIGH (ref 70–99)
Glucose-Capillary: 200 mg/dL — ABNORMAL HIGH (ref 70–99)
Glucose-Capillary: 125 mg/dL — ABNORMAL HIGH (ref 70–99)

## 2021-11-02 NOTE — Progress Notes (Signed)
[facility] Progress Note     Subjective: CC:  NAEO. Resting comfortably up in chair.  Objective: Vital signs in last 24 hours: Temp:  [97.4 F (36.3 C)-98 F (36.7 C)] 97.5 F (36.4 C) (10/08 0803) Pulse Rate:  [73-98] 80 (10/08 0803) Resp:  [12-18] 15 (10/08 0803) BP: (107-148)/(65-86) 144/65 (10/08 0803) SpO2:  [93 %-96 %] 94 % (10/08 0803) Last BM Date : 10/30/21  Intake/Output from previous day: 10/07 0701 - 10/08 0700 In: -  Out: 1275 [Urine:1275] Intake/Output this shift: No intake/output data recorded.  PE: Gen: comfortable, no distress Neuro: non-focal exam HEENT: PERRL Neck: supple CV: RRR Pulm: unlabored breathing Abd: soft, NT Extr: wwp, no edema    Lab Results:  Recent Labs    10/31/21 0316  WBC 5.2  HGB 11.6*  HCT 35.7*  PLT 191   BMET Recent Labs    10/31/21 0316 11/01/21 0230  NA 136 137  K 3.8 4.0  CL 106 105  CO2 24 24  GLUCOSE 137* 117*  BUN 25* 21  CREATININE 1.45* 1.29*  CALCIUM 8.4* 8.8*   PT/INR No results for input(s): "LABPROT", "INR" in the last 72 hours.  CMP     Component Value Date/Time   NA 137 11/01/2021 0230   K 4.0 11/01/2021 0230   CL 105 11/01/2021 0230   CO2 24 11/01/2021 0230   GLUCOSE 117 (H) 11/01/2021 0230   BUN 21 11/01/2021 0230   CREATININE 1.29 (H) 11/01/2021 0230   CALCIUM 8.8 (L) 11/01/2021 0230   PROT 6.3 (L) 10/28/2021 1436   ALBUMIN 3.5 10/28/2021 1436   AST 27 10/28/2021 1436   ALT 19 10/28/2021 1436   ALKPHOS 61 10/28/2021 1436   BILITOT 1.2 10/28/2021 1436   GFRNONAA 59 (L) 11/01/2021 0230   GFRAA 47 (L) 04/28/2017 0238   Lipase  No results found for: "LIPASE"     Studies/Results: No results found.  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 Marcello Moores whom is following from neurosurgery. DDAVP, F/U CT head 10/4. CT unchanged; Keppra x7d for  sz ppx. Repeat CT head in 2 weeks and outpatient follow up. Continue to hold ASA and plavix until this visit. Chronic LE wounds - WOC eval - appreciate assistance; was daily, xeroform dressings. HTN - hold home meds, lopressor PRN; has been mostly normotensive here off of his meds. DM2 - SSI Elevated creatinine - possibly some chronicity, down-trending, monitor VTE - PAS FEN - carb mod; SLIV, add ensure   Dispo: progressive care, therapies  Family and pt now want CIR, CIR re-consulted and insurance auth started yesterday 10/7   LOS: 5 days   I reviewed nursing notes, Consultant neurosurgery, neurology notes, last 24 h vitals and pain scores, last 48 h intake and output, last 24 h labs and trends, and last 24 h imaging results.   Obie Dredge, PA-C [facility] Please see Amion for pager number during day hours 7:00am-4:30pm

## 2021-11-03 LAB — GLUCOSE, CAPILLARY
Glucose-Capillary: 113 mg/dL — ABNORMAL HIGH (ref 70–99)
Glucose-Capillary: 129 mg/dL — ABNORMAL HIGH (ref 70–99)
Glucose-Capillary: 114 mg/dL — ABNORMAL HIGH (ref 70–99)
Glucose-Capillary: 138 mg/dL — ABNORMAL HIGH (ref 70–99)
Glucose-Capillary: 148 mg/dL — ABNORMAL HIGH (ref 70–99)
Glucose-Capillary: 139 mg/dL — ABNORMAL HIGH (ref 70–99)

## 2021-11-03 MED ORDER — POLYETHYLENE GLYCOL 3350 17 G PO PACK
17.0000 g | PACK | Freq: Every day | ORAL | Status: DC
  Administered 2021-11-03 – 2021-11-04 (×2): 17 g via ORAL
  Filled 2021-11-03 (×2): qty 1

## 2021-11-03 MED ORDER — DOCUSATE SODIUM 100 MG PO CAPS
100.0000 mg | ORAL_CAPSULE | Freq: Two times a day (BID) | ORAL | Status: DC
  Administered 2021-11-03 – 2021-11-04 (×3): 100 mg via ORAL
  Filled 2021-11-03 (×3): qty 1

## 2021-11-03 NOTE — TOC Progression Note (Signed)
Transition of Care [facility]) - Progression Note    Patient Details  Name: James Dougherty MRN: [id number] Date of Birth: 1950-07-22  Transition of Care [facility]) CM/SW Contact  Oren Section Cleta Alberts, RN Phone Number: 11/03/2021, 4:11 PM  Clinical Narrative:    Noted insurance authorization remains pending for inpatient rehab.  TOC case manager will continue to follow; should insurance deny rehab stay, will need to initiate insurance new auth for SNF.   Expected Discharge Plan: IP Rehab Facility Barriers to Discharge: Continued Medical Work up  Expected Discharge Plan and Services Expected Discharge Plan: [place]   Discharge Planning Services: CM Consult   Living arrangements for the past 2 months: Single Family Home                                       Social Determinants of Health (SDOH) Interventions    Readmission Risk Interventions     No data to display         Reinaldo Raddle, RN, BSN  Trauma/Neuro ICU Case Manager [phone]

## 2021-11-03 NOTE — Progress Notes (Addendum)
Inpatient Rehab Admissions Coordinator:  Awaiting insurance authorization decision. Will continue to follow.   ADDENDUM 1512: Insurance authorization received. Await bed availability.  Gayland Curry, [place], [place] Admissions Coordinator [phone]

## 2021-11-03 NOTE — Progress Notes (Signed)
[facility] Progress Note     Subjective:  Patient states he is doing well, feels like he is getting better with each day. States his appetite still is not great, but that is partly because he is getting tired of some of the food. Has not had a BM since Thursday, states he is normally very regular, and thinks this is because he hasn't been drinking as much water as at home. He is fully oriented and is in good spirits today.   Objective: Vital signs in last 24 hours: Temp:  [97.4 F (36.3 C)-98.3 F (36.8 C)] 98.2 F (36.8 C) (10/09 0800) Pulse Rate:  [66-90] 66 (10/09 0800) Resp:  [14-20] 20 (10/09 0800) BP: (133-169)/(45-74) 135/71 (10/09 0800) SpO2:  [95 %-98 %] 98 % (10/09 0800) Last BM Date : 10/30/21  Intake/Output from previous day: 10/08 0701 - 10/09 0700 In: 720 [P.O.:720] Out: -  Intake/Output this shift: No intake/output data recorded.  PE: Gen: comfortable, no distress Neuro: non-focal exam HEENT: PERRL Neck: supple CV: RRR Pulm: unlabored breathing Abd: soft, NT Extr: wwp, no edema    Lab Results:  No results for input(s): "WBC", "HGB", "HCT", "PLT" in the last 72 hours.  BMET Recent Labs    11/01/21 0230  NA 137  K 4.0  CL 105  CO2 24  GLUCOSE 117*  BUN 21  CREATININE 1.29*  CALCIUM 8.8*    PT/INR No results for input(s): "LABPROT", "INR" in the last 72 hours.  CMP     Component Value Date/Time   NA 137 11/01/2021 0230   K 4.0 11/01/2021 0230   CL 105 11/01/2021 0230   CO2 24 11/01/2021 0230   GLUCOSE 117 (H) 11/01/2021 0230   BUN 21 11/01/2021 0230   CREATININE 1.29 (H) 11/01/2021 0230   CALCIUM 8.8 (L) 11/01/2021 0230   PROT 6.3 (L) 10/28/2021 1436   ALBUMIN 3.5 10/28/2021 1436   AST 27 10/28/2021 1436   ALT 19 10/28/2021 1436   ALKPHOS 61 10/28/2021 1436   BILITOT 1.2 10/28/2021 1436   GFRNONAA 59 (L) 11/01/2021 0230   GFRAA 47 (L) 04/28/2017 0238   Lipase  No results found for:  "LIPASE"     Studies/Results: No results found.  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 Marcello Moores whom is following from neurosurgery. DDAVP, F/U CT head 10/4. CT unchanged; Keppra x7d for sz ppx. Repeat CT head in 2 weeks and outpatient follow up. Continue to hold ASA and plavix until this visit. Chronic LE wounds - WOC eval - appreciate assistance; was daily, xeroform dressings. HTN - hold home meds, lopressor PRN; has been mostly normotensive here off of his meds. DM2 - SSI Elevated creatinine - possibly some chronicity, down-trending, monitor VTE - PAS FEN - carb mod; SLIV, add ensure Constipation - add bowel regimen, Miralax PRN   Dispo: progressive care, therapies  Family and pt now want CIR, CIR re-consulted and insurance auth started 10/7, still pending.   LOS: 6 days   I reviewed nursing notes, Consultant neurosurgery, neurology notes, last 24 h vitals and pain scores, last 48 h intake and output, last 24 h labs and trends, and last 24 h imaging results.  Fontaine No, PA-S  Obie Dredge, [facility] Please see Amion for pager number during day hours 7:00am-4:30pm

## 2021-11-03 NOTE — Progress Notes (Signed)
Pt sitting up in chair A&O x 4, pain 0/10 BP 174/79, HR 105-110 with irregular rhythm. Pt denies chest pain, SOB. Spoke with trauma RN Nira Conn verified to admin labetalol for SBP 130-150.   Labetalol administered (see eMar). Will reassess VS within 1 hour.

## 2021-11-03 NOTE — Care Management Important Message (Signed)
Important Message  Patient Details  Name: James Dougherty MRN: [id number] Date of Birth: 1950/08/04   Medicare Important Message Given:  Yes     Hannah Beat 11/03/2021, 2:23 PM

## 2021-11-03 NOTE — Progress Notes (Signed)
Physical Therapy Treatment Patient Details Name: James Dougherty MRN: [id number] DOB: 06-Jan-1951 Today's Date: 11/03/2021   History of Present Illness 71 y.o. male brought in by EMS as a level 2 trauma after he fell and hit the back of his head while he was at the tire shop with his son. CT head + left subdural hematoma as well as a trace subarachnoid hemorrhage in left sylvian fissure.  Past medical history of arthritis, hyperlipidemia, hypertension, neuropathic pain, PAD, psoriasis, stroke, diabetes    PT Comments    Patient progressing with distance today, though still needing frequent standing rest breaks.  He was dizzy with balance tasks in hallway, but unable to attempt BPPV treatment with +1 A.  PT will conintue to follow acutely.  Continue to recommend acute inpatient rehab at d/c.   Recommendations for follow up therapy are one component of a multi-disciplinary discharge planning process, led by the attending physician.  Recommendations may be updated based on patient status, additional functional criteria and insurance authorization.  Follow Up Recommendations  Acute inpatient rehab (3hours/day)     Assistance Recommended at Discharge Frequent or constant Supervision/Assistance  Patient can return home with the following A little help with walking and/or transfers;A little help with bathing/dressing/bathroom;Assistance with cooking/housework;Direct supervision/assist for medications management;Direct supervision/assist for financial management;Assist for transportation;Help with stairs or ramp for entrance   Equipment Recommendations  Other (comment) (TBA)    Recommendations for Other Services       Precautions / Restrictions Precautions Precautions: Fall Precaution Comments: x2 recent falls     Mobility  Bed Mobility               General bed mobility comments: up in chair    Transfers Overall transfer level: Needs assistance Equipment used: Rolling walker (2  wheels) Transfers: Sit to/from Stand Sit to Stand: Min guard                Ambulation/Gait Ambulation/Gait assistance: Min guard Gait Distance (Feet): 100 Feet (x 3 with standing rest breaks for resting arms and catching his breath) Assistive device: Rolling walker (2 wheels) Gait Pattern/deviations: Step-through pattern, Decreased stride length, Shuffle, Trunk flexed       General Gait Details: states he has shuffled his feet for a long time, stopped x 2 to rest and performed hallway balance activities at [employer] [place] (Stroke Patients Only)       Balance Overall balance assessment: Needs assistance Sitting-balance support: No upper extremity supported Sitting balance-Leahy Scale: Good     Standing balance support: Bilateral upper extremity supported, Single extremity supported Standing balance-Leahy Scale: Poor Standing balance comment: UE support or at least minguard A for balance             High level balance activites: Side stepping, Other (comment) High Level Balance Comments: ant/post rocking with cues at wall rail; assist for rocking forward and back for balance            Cognition Arousal/Alertness: Awake/alert Behavior During Therapy: Flat affect, WFL for tasks assessed/performed Overall Cognitive Status: Impaired/Different from baseline Area of Impairment: Safety/judgement, Attention, Problem solving               Rancho Levels of Cognitive Functioning Rancho Los Amigos Scales of Cognitive Functioning: Automatic, Appropriate   Current Attention Level: Selective Memory: Decreased short-term memory   Safety/Judgement: Decreased awareness of  deficits   Problem Solving: Slow processing, Difficulty sequencing General Comments: able to attend to task in hallway   [place] of Cognitive Functioning: Automatic, Appropriate    Exercises      General Comments  General comments (skin integrity, edema, etc.): VSS HR 106 with mobility; reported dizziness with side stepping in hallway      Pertinent Vitals/Pain Pain Assessment Faces Pain Scale: No hurt    Home Living                          Prior Function            PT Goals (current goals can now be found in the care plan section) Progress towards PT goals: Progressing toward goals    Frequency    Min 4X/week      PT Plan Current plan remains appropriate    Co-evaluation              AM-PAC PT "6 Clicks" Mobility   Outcome Measure  Help needed turning from your back to your side while in a flat bed without using bedrails?: A Lot Help needed moving from lying on your back to sitting on the side of a flat bed without using bedrails?: Total Help needed moving to and from a bed to a chair (including a wheelchair)?: A Little Help needed standing up from a chair using your arms (e.g., wheelchair or bedside chair)?: A Little Help needed to walk in hospital room?: A Little Help needed climbing 3-5 steps with a railing? : Total 6 Click Score: 13    End of Session Equipment Utilized During Treatment: Gait belt Activity Tolerance: Patient limited by fatigue Patient left: in chair;with call bell/phone within reach   PT Visit Diagnosis: Unsteadiness on feet (R26.81);Other abnormalities of gait and mobility (R26.89);Repeated falls (R29.6);Other symptoms and signs involving the nervous system (R29.898);BPPV BPPV - Right/Left : Left     Time: 4496-7591 PT Time Calculation (min) (ACUTE ONLY): 25 min  Charges:  $Gait Training: 8-22 mins $Neuromuscular Re-education: 8-22 mins                     Magda Kiel, PT Acute Rehabilitation Services [phone] 11/03/2021    Reginia Naas 11/03/2021, 1:28 PM

## 2021-11-04 ENCOUNTER — Inpatient Hospital Stay (HOSPITAL_COMMUNITY)
Admission: RE | Admit: 2021-11-04 | Discharge: 2021-11-14 | DRG: 945 | Disposition: A | Discharge status: Home / Self Care | Payer: Medicare Other | Source: Intra-hospital | Attending: Physical Medicine and Rehabilitation | Admitting: Physical Medicine and Rehabilitation

## 2021-11-04 ENCOUNTER — Encounter (HOSPITAL_COMMUNITY): Payer: Self-pay | Admitting: Physical Medicine and Rehabilitation

## 2021-11-04 ENCOUNTER — Other Ambulatory Visit: Payer: Self-pay

## 2021-11-04 DIAGNOSIS — S069X9D Unspecified intracranial injury with loss of consciousness of unspecified duration, subsequent encounter: Secondary | ICD-10-CM | POA: Diagnosis not present

## 2021-11-04 DIAGNOSIS — M4802 Spinal stenosis, cervical region: Secondary | ICD-10-CM | POA: Diagnosis present

## 2021-11-04 DIAGNOSIS — D649 Anemia, unspecified: Secondary | ICD-10-CM | POA: Diagnosis present

## 2021-11-04 DIAGNOSIS — S065XAD Traumatic subdural hemorrhage with loss of consciousness status unknown, subsequent encounter: Principal | ICD-10-CM

## 2021-11-04 DIAGNOSIS — E875 Hyperkalemia: Secondary | ICD-10-CM | POA: Diagnosis not present

## 2021-11-04 DIAGNOSIS — I890 Lymphedema, not elsewhere classified: Secondary | ICD-10-CM | POA: Diagnosis present

## 2021-11-04 DIAGNOSIS — E1151 Type 2 diabetes mellitus with diabetic peripheral angiopathy without gangrene: Secondary | ICD-10-CM | POA: Diagnosis not present

## 2021-11-04 DIAGNOSIS — I739 Peripheral vascular disease, unspecified: Secondary | ICD-10-CM | POA: Diagnosis present

## 2021-11-04 DIAGNOSIS — E7800 Pure hypercholesterolemia, unspecified: Secondary | ICD-10-CM | POA: Diagnosis present

## 2021-11-04 DIAGNOSIS — Z87891 Personal history of nicotine dependence: Secondary | ICD-10-CM | POA: Diagnosis not present

## 2021-11-04 DIAGNOSIS — L89156 Pressure-induced deep tissue damage of sacral region: Secondary | ICD-10-CM | POA: Diagnosis present

## 2021-11-04 DIAGNOSIS — H5500 Unspecified nystagmus: Secondary | ICD-10-CM | POA: Diagnosis present

## 2021-11-04 DIAGNOSIS — E1122 Type 2 diabetes mellitus with diabetic chronic kidney disease: Secondary | ICD-10-CM | POA: Diagnosis not present

## 2021-11-04 DIAGNOSIS — D62 Acute posthemorrhagic anemia: Secondary | ICD-10-CM | POA: Diagnosis present

## 2021-11-04 DIAGNOSIS — I130 Hypertensive heart and chronic kidney disease with heart failure and stage 1 through stage 4 chronic kidney disease, or unspecified chronic kidney disease: Secondary | ICD-10-CM | POA: Diagnosis not present

## 2021-11-04 DIAGNOSIS — Z89411 Acquired absence of right great toe: Secondary | ICD-10-CM | POA: Diagnosis not present

## 2021-11-04 DIAGNOSIS — Z8249 Family history of ischemic heart disease and other diseases of the circulatory system: Secondary | ICD-10-CM | POA: Diagnosis not present

## 2021-11-04 DIAGNOSIS — W19XXXD Unspecified fall, subsequent encounter: Secondary | ICD-10-CM | POA: Diagnosis present

## 2021-11-04 DIAGNOSIS — E1140 Type 2 diabetes mellitus with diabetic neuropathy, unspecified: Secondary | ICD-10-CM | POA: Diagnosis not present

## 2021-11-04 DIAGNOSIS — I5022 Chronic systolic (congestive) heart failure: Secondary | ICD-10-CM | POA: Diagnosis not present

## 2021-11-04 DIAGNOSIS — L02413 Cutaneous abscess of right upper limb: Secondary | ICD-10-CM | POA: Diagnosis present

## 2021-11-04 DIAGNOSIS — N179 Acute kidney failure, unspecified: Secondary | ICD-10-CM | POA: Diagnosis not present

## 2021-11-04 DIAGNOSIS — R21 Rash and other nonspecific skin eruption: Secondary | ICD-10-CM | POA: Diagnosis present

## 2021-11-04 DIAGNOSIS — E871 Hypo-osmolality and hyponatremia: Secondary | ICD-10-CM | POA: Diagnosis present

## 2021-11-04 DIAGNOSIS — H9190 Unspecified hearing loss, unspecified ear: Secondary | ICD-10-CM | POA: Diagnosis present

## 2021-11-04 DIAGNOSIS — L409 Psoriasis, unspecified: Secondary | ICD-10-CM | POA: Diagnosis present

## 2021-11-04 DIAGNOSIS — Z8673 Personal history of transient ischemic attack (TIA), and cerebral infarction without residual deficits: Secondary | ICD-10-CM

## 2021-11-04 DIAGNOSIS — Z833 Family history of diabetes mellitus: Secondary | ICD-10-CM | POA: Diagnosis not present

## 2021-11-04 DIAGNOSIS — S065XAA Traumatic subdural hemorrhage with loss of consciousness status unknown, initial encounter: Secondary | ICD-10-CM | POA: Diagnosis not present

## 2021-11-04 DIAGNOSIS — I6381 Other cerebral infarction due to occlusion or stenosis of small artery: Secondary | ICD-10-CM | POA: Diagnosis not present

## 2021-11-04 DIAGNOSIS — Z7982 Long term (current) use of aspirin: Secondary | ICD-10-CM

## 2021-11-04 DIAGNOSIS — Z7902 Long term (current) use of antithrombotics/antiplatelets: Secondary | ICD-10-CM

## 2021-11-04 DIAGNOSIS — Z7985 Long-term (current) use of injectable non-insulin antidiabetic drugs: Secondary | ICD-10-CM

## 2021-11-04 DIAGNOSIS — N189 Chronic kidney disease, unspecified: Secondary | ICD-10-CM | POA: Diagnosis not present

## 2021-11-04 DIAGNOSIS — Z885 Allergy status to narcotic agent status: Secondary | ICD-10-CM

## 2021-11-04 DIAGNOSIS — Z888 Allergy status to other drugs, medicaments and biological substances status: Secondary | ICD-10-CM

## 2021-11-04 DIAGNOSIS — Z794 Long term (current) use of insulin: Secondary | ICD-10-CM

## 2021-11-04 DIAGNOSIS — K5900 Constipation, unspecified: Secondary | ICD-10-CM | POA: Diagnosis present

## 2021-11-04 DIAGNOSIS — Z83438 Family history of other disorder of lipoprotein metabolism and other lipidemia: Secondary | ICD-10-CM

## 2021-11-04 DIAGNOSIS — Z882 Allergy status to sulfonamides status: Secondary | ICD-10-CM

## 2021-11-04 DIAGNOSIS — S069XAA Unspecified intracranial injury with loss of consciousness status unknown, initial encounter: Secondary | ICD-10-CM | POA: Diagnosis present

## 2021-11-04 DIAGNOSIS — E118 Type 2 diabetes mellitus with unspecified complications: Secondary | ICD-10-CM | POA: Diagnosis present

## 2021-11-04 DIAGNOSIS — S069X0S Unspecified intracranial injury without loss of consciousness, sequela: Secondary | ICD-10-CM | POA: Diagnosis not present

## 2021-11-04 DIAGNOSIS — Z79899 Other long term (current) drug therapy: Secondary | ICD-10-CM

## 2021-11-04 LAB — GLUCOSE, CAPILLARY
Glucose-Capillary: 111 mg/dL — ABNORMAL HIGH (ref 70–99)
Glucose-Capillary: 133 mg/dL — ABNORMAL HIGH (ref 70–99)
Glucose-Capillary: 144 mg/dL — ABNORMAL HIGH (ref 70–99)
Glucose-Capillary: 130 mg/dL — ABNORMAL HIGH (ref 70–99)
Glucose-Capillary: 121 mg/dL — ABNORMAL HIGH (ref 70–99)
Glucose-Capillary: 128 mg/dL — ABNORMAL HIGH (ref 70–99)

## 2021-11-04 MED ORDER — POLYETHYLENE GLYCOL 3350 17 G PO PACK
34.0000 g | PACK | Freq: Once | ORAL | Status: AC
  Administered 2021-11-04: 34 g via ORAL
  Filled 2021-11-04: qty 2

## 2021-11-04 MED ORDER — LOSARTAN POTASSIUM 25 MG PO TABS
12.5000 mg | ORAL_TABLET | Freq: Every day | ORAL | Status: DC
  Administered 2021-11-04: 12.5 mg via ORAL
  Filled 2021-11-04: qty 0.5

## 2021-11-04 MED ORDER — HYDROCERIN EX CREA: 1.0000 | TOPICAL_CREAM | Freq: Two times a day (BID) | CUTANEOUS | Status: DC | PRN

## 2021-11-04 MED ORDER — GABAPENTIN 300 MG PO CAPS
600.0000 mg | ORAL_CAPSULE | Freq: Every day | ORAL | Status: DC
  Administered 2021-11-04 – 2021-11-13 (×10): 600 mg via ORAL
  Filled 2021-11-04 (×10): qty 2

## 2021-11-04 MED ORDER — BISOPROLOL FUMARATE 5 MG PO TABS
2.5000 mg | ORAL_TABLET | Freq: Every day | ORAL | Status: DC
  Administered 2021-11-05 – 2021-11-14 (×10): 2.5 mg via ORAL
  Filled 2021-11-04 (×10): qty 0.5

## 2021-11-04 MED ORDER — ALUM & MAG HYDROXIDE-SIMETH 200-200-20 MG/5ML PO SUSP: 30.0000 mL | ORAL | Status: DC | PRN

## 2021-11-04 MED ORDER — FLEET ENEMA 7-19 GM/118ML RE ENEM: 1.0000 | ENEMA | Freq: Once | RECTAL | Status: DC | PRN

## 2021-11-04 MED ORDER — POLYETHYLENE GLYCOL 3350 17 G PO PACK
17.0000 g | PACK | Freq: Every day | ORAL | Status: DC
  Administered 2021-11-05 – 2021-11-13 (×6): 17 g via ORAL
  Filled 2021-11-04 (×10): qty 1

## 2021-11-04 MED ORDER — GUAIFENESIN-DM 100-10 MG/5ML PO SYRP: 5.0000 mL | ORAL_SOLUTION | Freq: Four times a day (QID) | ORAL | Status: DC | PRN

## 2021-11-04 MED ORDER — DULAGLUTIDE 1.5 MG/0.5ML ~~LOC~~ SOAJ
1.5000 mg | SUBCUTANEOUS | Status: DC
  Administered 2021-11-08: 1.5 mg via SUBCUTANEOUS
  Filled 2021-11-04: qty 0.5

## 2021-11-04 MED ORDER — PROCHLORPERAZINE EDISYLATE 10 MG/2ML IJ SOLN: 5.0000 mg | Freq: Four times a day (QID) | INTRAMUSCULAR | Status: DC | PRN

## 2021-11-04 MED ORDER — GERHARDT'S BUTT CREAM
TOPICAL_CREAM | Freq: Two times a day (BID) | CUTANEOUS | Status: DC
  Filled 2021-11-04: qty 1

## 2021-11-04 MED ORDER — ACETAMINOPHEN 325 MG PO TABS: 325.0000 mg | ORAL_TABLET | ORAL | Status: DC | PRN

## 2021-11-04 MED ORDER — ORAL CARE MOUTH RINSE: 15.0000 mL | OROMUCOSAL | Status: DC | PRN

## 2021-11-04 MED ORDER — INSULIN ASPART PROT & ASPART (70-30 MIX) 100 UNIT/ML ~~LOC~~ SUSP
5.0000 [IU] | Freq: Every day | SUBCUTANEOUS | Status: DC
  Administered 2021-11-04 – 2021-11-13 (×9): 5 [IU] via SUBCUTANEOUS
  Filled 2021-11-04 (×2): qty 10

## 2021-11-04 MED ORDER — POLYETHYLENE GLYCOL 3350 17 G PO PACK: 17.0000 g | PACK | Freq: Every day | ORAL | Status: DC | PRN

## 2021-11-04 MED ORDER — TRAZODONE HCL 50 MG PO TABS
25.0000 mg | ORAL_TABLET | Freq: Every evening | ORAL | Status: DC | PRN
  Administered 2021-11-11: 25 mg via ORAL
  Filled 2021-11-04 (×2): qty 1

## 2021-11-04 MED ORDER — ATORVASTATIN CALCIUM 10 MG PO TABS: 20.0000 mg | ORAL_TABLET | Freq: Every day | ORAL | Status: DC

## 2021-11-04 MED ORDER — MORPHINE SULFATE 15 MG PO TABS: 7.5000 mg | ORAL_TABLET | ORAL | Status: DC | PRN

## 2021-11-04 MED ORDER — ACETAMINOPHEN 325 MG PO TABS
650.0000 mg | ORAL_TABLET | Freq: Three times a day (TID) | ORAL | Status: DC
  Administered 2021-11-04 – 2021-11-14 (×40): 650 mg via ORAL
  Filled 2021-11-04 (×41): qty 2

## 2021-11-04 MED ORDER — FUROSEMIDE 20 MG PO TABS
20.0000 mg | ORAL_TABLET | Freq: Every day | ORAL | Status: DC
  Administered 2021-11-04: 20 mg via ORAL
  Filled 2021-11-04: qty 1

## 2021-11-04 MED ORDER — DIPHENHYDRAMINE HCL 12.5 MG/5ML PO ELIX: 12.5000 mg | ORAL_SOLUTION | Freq: Four times a day (QID) | ORAL | Status: DC | PRN

## 2021-11-04 MED ORDER — METHOCARBAMOL 500 MG PO TABS
1000.0000 mg | ORAL_TABLET | Freq: Three times a day (TID) | ORAL | Status: DC
  Administered 2021-11-04 – 2021-11-07 (×8): 1000 mg via ORAL
  Filled 2021-11-04 (×8): qty 2

## 2021-11-04 MED ORDER — FUROSEMIDE 20 MG PO TABS
20.0000 mg | ORAL_TABLET | Freq: Every day | ORAL | Status: DC
  Administered 2021-11-05 – 2021-11-14 (×10): 20 mg via ORAL
  Filled 2021-11-04 (×10): qty 1

## 2021-11-04 MED ORDER — INSULIN ASPART 100 UNIT/ML IJ SOLN
0.0000 [IU] | Freq: Three times a day (TID) | INTRAMUSCULAR | Status: DC
  Administered 2021-11-04 – 2021-11-12 (×15): 2 [IU] via SUBCUTANEOUS

## 2021-11-04 MED ORDER — LOSARTAN POTASSIUM 25 MG PO TABS
12.5000 mg | ORAL_TABLET | Freq: Every day | ORAL | Status: DC
  Administered 2021-11-05 – 2021-11-14 (×10): 12.5 mg via ORAL
  Filled 2021-11-04 (×10): qty 0.5

## 2021-11-04 MED ORDER — BISACODYL 10 MG RE SUPP: 10.0000 mg | Freq: Every day | RECTAL | Status: DC | PRN

## 2021-11-04 MED ORDER — PROCHLORPERAZINE 25 MG RE SUPP: 12.5000 mg | Freq: Four times a day (QID) | RECTAL | Status: DC | PRN

## 2021-11-04 MED ORDER — INSULIN ASPART PROT & ASPART (70-30 MIX) 100 UNIT/ML ~~LOC~~ SUSP
16.0000 [IU] | Freq: Every day | SUBCUTANEOUS | Status: DC
  Filled 2021-11-04: qty 10

## 2021-11-04 MED ORDER — PROCHLORPERAZINE MALEATE 5 MG PO TABS: 5.0000 mg | ORAL_TABLET | Freq: Four times a day (QID) | ORAL | Status: DC | PRN

## 2021-11-04 MED ORDER — METOPROLOL TARTRATE 5 MG/5ML IV SOLN: 5.0000 mg | Freq: Four times a day (QID) | INTRAVENOUS | Status: DC | PRN

## 2021-11-04 MED ORDER — BISOPROLOL FUMARATE 5 MG PO TABS
2.5000 mg | ORAL_TABLET | Freq: Every day | ORAL | Status: DC
  Administered 2021-11-04: 2.5 mg via ORAL
  Filled 2021-11-04: qty 0.5

## 2021-11-04 MED ORDER — ATORVASTATIN CALCIUM 10 MG PO TABS
20.0000 mg | ORAL_TABLET | Freq: Every day | ORAL | Status: DC
  Administered 2021-11-04 – 2021-11-13 (×10): 20 mg via ORAL
  Filled 2021-11-04 (×10): qty 2

## 2021-11-04 NOTE — Progress Notes (Signed)
Inpatient Rehab Admissions Coordinator:  There is a bed available for pt in CIR today. Saverio Danker, PA-C aware and in agreement. Pt, NSG, and TOC made aware. Left a message for pt's daughter [name].   Gayland Curry, [place], [place] Admissions Coordinator [phone]

## 2021-11-04 NOTE — Progress Notes (Signed)
[facility] Progress Note     Subjective: NAEO. Resting comfortably up in chair.  Objective: Vital signs in last 24 hours: Temp:  [97.5 F (36.4 C)-98.4 F (36.9 C)] 97.9 F (36.6 C) (10/10 0742) Pulse Rate:  [76-107] 86 (10/10 0742) Resp:  [13-20] 13 (10/10 0742) BP: (132-171)/(61-95) 132/69 (10/10 0742) SpO2:  [93 %-97 %] 93 % (10/10 0742) Last BM Date : 10/30/21  Intake/Output from previous day: 10/09 0701 - 10/10 0700 In: 120 [P.O.:120] Out: 700 [Urine:700] Intake/Output this shift: Total I/O In: 117 [P.O.:117] Out: 500 [Urine:500]  PE: Gen: comfortable, no distress Neuro: non-focal exam HEENT: PERRL Neck: supple CV: RRR Pulm: unlabored breathing Abd: soft, NT Extr: wwp, no edema    Lab Results:  No results for input(s): "WBC", "HGB", "HCT", "PLT" in the last 72 hours.  BMET No results for input(s): "NA", "K", "CL", "CO2", "GLUCOSE", "BUN", "CREATININE", "CALCIUM" in the last 72 hours.  PT/INR No results for input(s): "LABPROT", "INR" in the last 72 hours.  CMP     Component Value Date/Time   NA 137 11/01/2021 0230   K 4.0 11/01/2021 0230   CL 105 11/01/2021 0230   CO2 24 11/01/2021 0230   GLUCOSE 117 (H) 11/01/2021 0230   BUN 21 11/01/2021 0230   CREATININE 1.29 (H) 11/01/2021 0230   CALCIUM 8.8 (L) 11/01/2021 0230   PROT 6.3 (L) 10/28/2021 1436   ALBUMIN 3.5 10/28/2021 1436   AST 27 10/28/2021 1436   ALT 19 10/28/2021 1436   ALKPHOS 61 10/28/2021 1436   BILITOT 1.2 10/28/2021 1436   GFRNONAA 59 (L) 11/01/2021 0230   GFRAA 47 (L) 04/28/2017 0238   Lipase  No results found for: "LIPASE"     Studies/Results: No results found.  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 Marcello Moores whom is following from neurosurgery. DDAVP, F/U CT head 10/4. CT unchanged; Keppra x7d for sz ppx. Repeat CT head in 2 weeks  and outpatient follow up. Continue to hold ASA and plavix until this visit. Chronic LE wounds - WOC eval - appreciate assistance; was daily, xeroform dressings. HTN - resume home meds DM2 - SSI, resume home med Elevated creatinine - possibly some chronicity, down-trending, monitor, resume home lasix.  Check BMET in am VTE - PAS FEN - carb mod; SLIV, ensure   Dispo: awaiting CIR   LOS: 7 days   I reviewed nursing notes, Consultant neurosurgery, neurology notes, last 24 h vitals and pain scores, last 48 h intake and output, last 24 h labs and trends, and last 24 h imaging results.   Henreitta Cea, [facility] Surgery Please see Amion for pager number during day hours 7:00am-4:30pm

## 2021-11-04 NOTE — Progress Notes (Signed)
Mobility Specialist Progress Note   11/04/21 1515  Mobility  Activity Ambulated with assistance in hallway  Level of Assistance Contact guard assist, steadying assist  Assistive Device Front wheel walker  Distance Ambulated (ft) 100 ft  Activity Response Tolerated well  $Mobility charge 1 Mobility   Pre Mobility: 74 HR, 140/54 BP, 98% SpO2  Received in chair having no complaints and agreeable. X1 standing rest break on railing d/t UE fatigue but able to make it back to room w/o fault or complaint. Left in room w/ RN prepping for transfer to CIR.  Holland Falling Mobility Specialist MS [facility] #:  [phone] Acute Rehab Office:  [phone]

## 2021-11-04 NOTE — TOC Transition Note (Signed)
Transition of Care [facility]) - CM/SW Discharge Note   Patient Details  Name: James Dougherty MRN: [id number] Date of Birth: 01/19/1951  Transition of Care [facility]) CM/SW Contact:  Ella Bodo, RN Phone Number: 11/04/2021, 2:44 PM   Clinical Narrative:    Patient medically stable for discharge and bed available today at [facility].  Plan discharge to CIR when bed ready.   Final next level of care: IP Rehab Facility Barriers to Discharge: Barriers Resolved   Patient Goals and CMS Choice   CMS Medicare.gov Compare Post Acute Care list provided to:: Patient Choice offered to / list presented to : Patient, Adult Children                      Discharge Plan and Services   Discharge Planning Services: CM Consult Post Acute Care Choice: IP Rehab                               Social Determinants of Health (SDOH) Interventions     Readmission Risk Interventions     No data to display         Reinaldo Raddle, RN, BSN  Trauma/Neuro ICU Case Manager [phone]
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
## 2021-11-05 ENCOUNTER — Telehealth (HOSPITAL_COMMUNITY): Payer: Self-pay | Admitting: Pharmacy Technician

## 2021-11-05 ENCOUNTER — Other Ambulatory Visit (HOSPITAL_COMMUNITY): Payer: Self-pay

## 2021-11-05 DIAGNOSIS — S069X0S Unspecified intracranial injury without loss of consciousness, sequela: Secondary | ICD-10-CM

## 2021-11-05 LAB — CBC WITH DIFFERENTIAL/PLATELET
Abs Immature Granulocytes: 0.02 10*3/uL (ref 0.00–0.07)
Basophils Absolute: 0 10*3/uL (ref 0.0–0.1)
Basophils Relative: 1 %
Eosinophils Absolute: 0.5 10*3/uL (ref 0.0–0.5)
Eosinophils Relative: 7 %
HCT: 38.7 % — ABNORMAL LOW (ref 39.0–52.0)
Hemoglobin: 12.7 g/dL — ABNORMAL LOW (ref 13.0–17.0)
Immature Granulocytes: 0 %
Lymphocytes Relative: 21 %
Lymphs Abs: 1.5 10*3/uL (ref 0.7–4.0)
MCH: 32.3 pg (ref 26.0–34.0)
MCHC: 32.8 g/dL (ref 30.0–36.0)
MCV: 98.5 fL (ref 80.0–100.0)
Monocytes Absolute: 0.8 10*3/uL (ref 0.1–1.0)
Monocytes Relative: 11 %
Neutro Abs: 4.3 10*3/uL (ref 1.7–7.7)
Neutrophils Relative %: 60 %
Platelets: 283 10*3/uL (ref 150–400)
RBC: 3.93 MIL/uL — ABNORMAL LOW (ref 4.22–5.81)
RDW: 15.4 % (ref 11.5–15.5)
WBC: 7.1 10*3/uL (ref 4.0–10.5)
nRBC: 0 % (ref 0.0–0.2)

## 2021-11-05 LAB — COMPREHENSIVE METABOLIC PANEL
ALT: 24 U/L (ref 0–44)
AST: 23 U/L (ref 15–41)
Albumin: 3.3 g/dL — ABNORMAL LOW (ref 3.5–5.0)
Alkaline Phosphatase: 64 U/L (ref 38–126)
Anion gap: 8 (ref 5–15)
BUN: 20 mg/dL (ref 8–23)
CO2: 28 mmol/L (ref 22–32)
Calcium: 9.1 mg/dL (ref 8.9–10.3)
Chloride: 105 mmol/L (ref 98–111)
Creatinine, Ser: 1.59 mg/dL — ABNORMAL HIGH (ref 0.61–1.24)
GFR, Estimated: 46 mL/min — ABNORMAL LOW (ref >60)
Glucose, Bld: 159 mg/dL — ABNORMAL HIGH (ref 70–99)
Potassium: 4.7 mmol/L (ref 3.5–5.1)
Sodium: 141 mmol/L (ref 135–145)
Total Bilirubin: 0.9 mg/dL (ref 0.3–1.2)
Total Protein: 6.1 g/dL — ABNORMAL LOW (ref 6.5–8.1)

## 2021-11-05 LAB — GLUCOSE, CAPILLARY
Glucose-Capillary: 122 mg/dL — ABNORMAL HIGH (ref 70–99)
Glucose-Capillary: 123 mg/dL — ABNORMAL HIGH (ref 70–99)
Glucose-Capillary: 107 mg/dL — ABNORMAL HIGH (ref 70–99)
Glucose-Capillary: 143 mg/dL — ABNORMAL HIGH (ref 70–99)

## 2021-11-05 MED ORDER — SENNOSIDES-DOCUSATE SODIUM 8.6-50 MG PO TABS
1.0000 | ORAL_TABLET | Freq: Two times a day (BID) | ORAL | Status: DC
  Administered 2021-11-05 – 2021-11-14 (×15): 1 via ORAL
  Filled 2021-11-05 (×18): qty 1

## 2021-11-05 MED ORDER — TRIAMCINOLONE 0.1 % CREAM:EUCERIN CREAM 1:1
TOPICAL_CREAM | Freq: Two times a day (BID) | CUTANEOUS | Status: DC
  Administered 2021-11-10 – 2021-11-12 (×3): 1 via TOPICAL
  Filled 2021-11-05 (×2): qty 1

## 2021-11-05 NOTE — Discharge Instructions (Addendum)
Inpatient Rehab Discharge Instructions  James Dougherty Discharge date and time:  11/14/21  Activities/Precautions/ Functional Status: Activity: No lifting, driving, or strenuous exercise for till cleared by MD Diet: cardiac diet Wound Care: keep wound clean and dry   Functional status:  ___ No restrictions     ___ Walk up steps independently _X__ 24/7 supervision/assistance   ___ Walk up steps with assistance ___ Intermittent supervision/assistance  ___ Bathe/dress independently ___ Walk with walker     ___ Bathe/dress with assistance ___ Walk Independently    ___ Shower independently ___ Walk with assistance    _X__ Shower with assistance _X__ No alcohol     ___ Return to work/school ________   COMMUNITY REFERRALS UPON DISCHARGE:    Outpatient: PT     OT                                        Agency:[facility] location  Phone:[phone]              Appointment Date/Time:*Please expect follow-up within 7-10 business days to schedule your appointment. If you have not received follow-up, be sure to contact the site directly.*   Special Instructions: No alcohol, Aspirin or aspirin containing products. No aleve, ibuprofen, motrin (or any other NSAIDS) or Plavix.    My questions have been answered and I understand these instructions. I will adhere to these goals and the provided educational materials after my discharge from the hospital.  Patient/Caregiver Signature _______________________________ Date __________  Clinician Signature _______________________________________ Date __________  Please bring this form and your medication list with you to all your follow-up doctor's appointments.

## 2021-11-05 NOTE — Plan of Care (Signed)
  Problem: RH Expression Communication Goal: LTG Patient will increase speech intelligibility (SLP) Description: LTG: Patient will increase speech intelligibility at word/phrase/conversation level with cues, % of the time (SLP) Flowsheets (Taken 11/05/2021 1602) LTG: Patient will increase speech intelligibility (SLP): Modified Independent   Problem: RH Problem Solving Goal: LTG Patient will demonstrate problem solving for (SLP) Description: LTG:  Patient will demonstrate problem solving for basic/complex daily situations with cues  (SLP) Flowsheets (Taken 11/05/2021 1602) LTG: Patient will demonstrate problem solving for (SLP): Complex daily situations LTG Patient will demonstrate problem solving for: Modified Independent   Problem: RH Awareness Goal: LTG: Patient will demonstrate awareness during functional activites type of (SLP) Description: LTG: Patient will demonstrate awareness during functional activites type of (SLP) Flowsheets (Taken 11/05/2021 1602) Patient will demonstrate during cognitive/linguistic activities awareness type of: Anticipatory LTG: Patient will demonstrate awareness during cognitive/linguistic activities with assistance of (SLP): Modified Independent

## 2021-11-05 NOTE — Evaluation (Signed)
Occupational Therapy Assessment and Plan  Patient Details  Name: James Dougherty MRN: [id number] Date of Birth: July 16, 1950  OT Diagnosis: abnormal posture, cognitive deficits, and muscle weakness (generalized) Rehab Potential: Rehab Potential (ACUTE ONLY): Good ELOS: 7-10 days   Today's Date: 11/05/2021 OT Individual Time: 0745-0900 OT Individual Time Calculation (min): 75 min     Hospital Problem: Principal Problem:   TBI (traumatic brain injury) ([place])   Past Medical History:  Past Medical History:  Diagnosis Date   Arthritis    "hands" (12/27/2012)   High cholesterol    Hypertension    Neuropathic pain    PAD (peripheral artery disease) ([place])    Psoriasis    Stroke ([place]) 1999   "still have some speech problems at times; sometimes forget what I was going to say" (12/27/2012)   Type II diabetes mellitus ([place])    Ulcer    Left ankle/leg   Past Surgical History:  Past Surgical History:  Procedure Laterality Date   ABDOMINAL AORTAGRAM N/A 03/30/2012   Procedure: ABDOMINAL Maxcine Ham;  Surgeon: Serafina Mitchell, MD;  Location[facility] CATH LAB;  Service: Cardiovascular;  Laterality: N/A;   ABDOMINAL AORTAGRAM N/A 12/27/2012   Procedure: ABDOMINAL Maxcine Ham;  Surgeon: Serafina Mitchell, MD;  Location: [facility] CATH LAB;  Service: Cardiovascular;  Laterality: N/A;   ABDOMINAL AORTOGRAM W/LOWER EXTREMITY N/A 04/05/2017   Procedure: ABDOMINAL AORTOGRAM W/LOWER EXTREMITY;  Surgeon: Elam Dutch, MD;  Location: [place] CV LAB;  Service: Cardiovascular;  Laterality: N/A;   AMPUTATION Right 04/26/2017   Procedure: AMPUTATION TRANSMETATARSAL RIGHT GREAT TOE;  Surgeon: Rosetta Posner, MD;  Location: [facility] OR;  Service: Vascular;  Laterality: Right;   ANGIOPLASTY / STENTING FEMORAL Left 65/09/9355   APPLICATION OF WOUND VAC Right 04/26/2017   Procedure: APPLICATION OF WOUND VAC;  Surgeon: Rosetta Posner, MD;  Location: [place];  Service: Vascular;  Laterality: Right;   FEMORAL ARTERY STENT  03/30/2012    LOWER EXTREMITY ANGIOGRAM  12/27/2012   Procedure: LOWER EXTREMITY ANGIOGRAM;  Surgeon: Serafina Mitchell, MD;  Location: [facility] CATH LAB;  Service: Cardiovascular;;   LOWER EXTREMITY ANGIOGRAPHY N/A 04/19/2018   Procedure: LOWER EXTREMITY ANGIOGRAPHY;  Surgeon: Serafina Mitchell, MD;  Location: [place] CV LAB;  Service: Cardiovascular;  Laterality: N/A;   PERIPHERAL VASCULAR ATHERECTOMY Right 04/05/2017   Procedure: PERIPHERAL VASCULAR ATHERECTOMY;  Surgeon: Elam Dutch, MD;  Location: [place] CV LAB;  Service: Cardiovascular;  Laterality: Right;  superficial femoral   PERIPHERAL VASCULAR BALLOON ANGIOPLASTY  04/19/2018   Procedure: PERIPHERAL VASCULAR BALLOON ANGIOPLASTY;  Surgeon: Serafina Mitchell, MD;  Location: [place] CV LAB;  Service: Cardiovascular;;   PERIPHERAL VASCULAR INTERVENTION Right 04/05/2017   Procedure: PERIPHERAL VASCULAR INTERVENTION;  Surgeon: Elam Dutch, MD;  Location: [place] CV LAB;  Service: Cardiovascular;  Laterality: Right;   Superficial femorl and external iliac   POPLITEAL ARTERY STENT     TONSILLECTOMY AND ADENOIDECTOMY      Assessment & Plan Clinical Impression: 71 y.o. male brought in by EMS as a level 2 trauma after he fell and hit the back of his head while he was at the tire shop with his son. CT head + left subdural hematoma as well as a trace subarachnoid hemorrhage in left sylvian fissure.  Past medical history of arthritis, hyperlipidemia, hypertension, neuropathic pain, PAD, psoriasis, stroke, diabetes   Patient currently requires min with basic self-care skills secondary to muscle weakness, decreased cardiorespiratoy endurance, decreased coordination, decreased attention,  decreased awareness, decreased problem solving, decreased safety awareness, decreased memory, delayed processing, and demonstrates behaviors consistent with [place] Level 7, and decreased standing balance, decreased postural control, and decreased balance strategies.  Prior  to hospitalization, patient could complete BADL/IADL with modified independent .  Patient will benefit from skilled intervention to decrease level of assist with basic self-care skills prior to discharge home with care partner.  Anticipate patient will require 24 hour supervision and follow up home health.  OT - End of Session Activity Tolerance: Tolerates 30+ min activity with multiple rests Endurance Deficit: Yes OT Assessment Rehab Potential (ACUTE ONLY): Good OT Barriers to Discharge: Decreased caregiver support OT Barriers to Discharge Comments: unsure how long family can provide 24/7 S OT Patient demonstrates impairments in the following area(s): Balance;Cognition;Endurance;Safety;Vision OT Basic ADL's Functional Problem(s): Grooming;Bathing;Dressing;Toileting OT Advanced ADL's Functional Problem(s): Simple Meal Preparation OT Transfers Functional Problem(s): Toilet;Tub/Shower OT Plan OT Intensity: Minimum of 1-2 x/day, 45 to 90 minutes OT Frequency: 5 out of 7 days OT Duration/Estimated Length of Stay: 7-10 days OT Treatment/Interventions: Balance/vestibular training;Discharge planning;Pain management;Self Care/advanced ADL retraining;Therapeutic Activities;UE/LE Coordination activities;Visual/perceptual remediation/compensation;Therapeutic Exercise;Skin care/wound managment;Patient/family education;Functional mobility training;Disease mangement/prevention;Cognitive remediation/compensation;Community reintegration;DME/adaptive equipment instruction;Neuromuscular re-education;Psychosocial support;Splinting/orthotics;UE/LE Strength taining/ROM;Wheelchair propulsion/positioning OT Self Feeding Anticipated Outcome(s): no goal OT Basic Self-Care Anticipated Outcome(s): MOD I dressing; S shower OT Toileting Anticipated Outcome(s): MOD I OT Bathroom Transfers Anticipated Outcome(s): S shower; MOD I toilet OT Recommendation Recommendations for Other Services: Therapeutic Recreation  consult Therapeutic Recreation Interventions: Outing/community reintergration;Pet therapy Patient destination: Home Follow Up Recommendations: Home health OT Equipment Recommended: To be determined Equipment Details: maybe TTB   OT Evaluation Precautions/Restrictions  Precautions Precautions: Fall Precaution Comments: x2 recent falls Restrictions Weight Bearing Restrictions: No General Chart Reviewed: Yes Family/Caregiver Present: No Vital Signs 134/62 sitting EOB Therapy Vitals Temp: 98.4 F (36.9 C) Temp Source: Oral Pulse Rate: 87 Resp: 16 BP: 123/66 Patient Position (if appropriate): Lying Oxygen Therapy SpO2: 98 % O2 Device: Room Air Pain Pain Assessment Pain Score: 0-No pain Home Living/Prior Functioning Home Living Family/patient expects to be discharged to:: Private residence Living Arrangements: Alone, Children Available Help at Discharge: Family, Available 24 hours/day (Per admission coordinator, pt daughter plans to provide 24/7 supervision) Type of Home: House Home Access: Stairs to enter Technical brewer of Steps: 1-2 Entrance Stairs-Rails: Right Home Layout: One level Bathroom Shower/Tub: Chiropodist: Handicapped height Bathroom Accessibility: No  Lives With: Alone Prior Function Level of Independence: Independent with basic ADLs, Independent with homemaking with ambulation Driving: Yes Vision Baseline Vision/History: 1 Wears glasses Ability to See in Adequate Light: 0 Adequate Patient Visual Report: Blurring of vision Vision Assessment?: No apparent visual deficits Additional Comments: overall WFL and able to read menu Perception  Perception: Within Functional Limits Praxis Praxis: Intact Cognition Cognition Overall Cognitive Status: Impaired/Different from baseline Arousal/Alertness: Awake/alert Orientation Level: Person;Place;Situation Person: Oriented Place: Oriented Situation: Oriented Memory:  Impaired [place] [employer] Scales of Cognitive Functioning: Automatic, Appropriate Brief Interview for Mental Status (BIMS) Repetition of Three Words (First Attempt): 3 Temporal Orientation: Year: Correct Temporal Orientation: Month: Accurate within 5 days Temporal Orientation: Day: Correct Recall: "Sock": Yes, no cue required Recall: "Blue": Yes, no cue required Recall: "Bed": Yes, no cue required BIMS Summary Score: 15 Sensation Sensation Light Touch: Appears Intact Coordination Gross Motor Movements are Fluid and Coordinated: Yes Fine Motor Movements are Fluid and Coordinated: No Finger Nose Finger Test: slightly slow BUE Motor  Motor Motor: Abnormal postural alignment and control  Trunk/Postural Assessment  Cervical  Assessment Cervical Assessment: Exceptions to [facility] (head forward) Thoracic Assessment Thoracic Assessment: Exceptions to [facility] (rounded shoulders) Lumbar Assessment Lumbar Assessment: Exceptions to [facility] (post pelvic tilt) Postural Control Postural Control: Deficits on evaluation (minimally delayed)  Balance Balance Balance Assessed: Yes Dynamic Sitting Balance Dynamic Sitting - Level of Assistance: 5: Stand by assistance Static Standing Balance Static Standing - Level of Assistance: 4: Min assist;5: Stand by assistance Dynamic Standing Balance Dynamic Standing - Level of Assistance: 4: Min assist;3: Mod assist Extremity/Trunk Assessment RUE Assessment RUE Assessment: Exceptions to [facility] General Strength Comments: ROM WFL 3+/5, slight discoordinataiton LUE Assessment LUE Assessment: Within Functional Limits  Care Tool Care Tool Self Care Eating   Eating Assist Level: Set up assist    Oral Care    Oral Care Assist Level: Minimal Assistance - Patient > 75%    Bathing   Body parts bathed by patient: Right arm;Left arm;Chest;Abdomen;Front perineal area;Buttocks;Right upper leg;Left upper leg;Face Body parts bathed by helper: Right lower leg;Left lower  leg   Assist Level: Minimal Assistance - Patient > 75%    Upper Body Dressing(including orthotics)   What is the patient wearing?: Pull over shirt   Assist Level: Set up assist    Lower Body Dressing (excluding footwear)   What is the patient wearing?: Pants Assist for lower body dressing: Moderate Assistance - Patient 50 - 74%    Putting on/Taking off footwear   What is the patient wearing?: Shoes;Non-skid slipper socks Assist for footwear: Moderate Assistance - Patient 50 - 74%       Care Tool Toileting Toileting activity   Assist for toileting: Minimal Assistance - Patient > 75%     Care Tool Bed Mobility Roll left and right activity        Sit to lying activity        Lying to sitting on side of bed activity         Care Tool Transfers Sit to stand transfer        Chair/bed transfer         Toilet transfer         Care Tool Cognition  Expression of Ideas and Wants    Understanding Verbal and Non-Verbal Content     Memory/Recall Ability     Refer to Care Plan for Long Term Goals  SHORT TERM GOAL WEEK 1 OT Short Term Goal 1 (Week 1): STG=LTG d/t ELOS  Recommendations for other services: Therapeutic Recreation  Pet therapy and Outing/community reintegration   Skilled Therapeutic Intervention ADL ADL Eating: Set up Where Assessed-Eating: Edge of bed Grooming: Minimal assistance Where Assessed-Grooming: Standing at sink Upper Body Bathing: Supervision/safety Where Assessed-Upper Body Bathing: Shower Lower Body Bathing: Minimal assistance Where Assessed-Lower Body Bathing: Shower Upper Body Dressing: Supervision/safety Where Assessed-Upper Body Dressing: Edge of bed Lower Body Dressing: Minimal assistance Where Assessed-Lower Body Dressing: Edge of bed Toileting: Minimal assistance Where Assessed-Toileting: Bedside Commode Toilet Transfer: Minimal assistance Toilet Transfer Method: Counselling psychologist: Chief of Staff: Minimal assistance Social research officer, government Method: Heritage manager: Primary school teacher Sit to Stand: Minimal Assistance - Patient > 75% Stand to Sit: Minimal Assistance - Patient > 75%  Pt received in bed agreeable to OT after education on OT role/purpose, CIR, ELOS and POC. Pt completes BADL as stated above with MIN A for balance and decreased safety awareness as evidenced by pt attempting to stand to thread pants. Education on balance deficits and increased  risk of falling. Pt very jolly throughout session and motivated to reach MOD I level prior to get home. Discussed some MOD I goals and some supervision level goals. Exited session with pt seated in recliner, exit alarm on and call light in reach    Discharge Criteria: Patient will be discharged from OT if patient refuses treatment 3 consecutive times without medical reason, if treatment goals not met, if there is a change in medical status, if patient makes no progress towards goals or if patient is discharged from hospital.  The above assessment, treatment plan, treatment alternatives and goals were discussed and mutually agreed upon: by patient  Tonny Branch 11/05/2021, 9:04 AM

## 2021-11-05 NOTE — TOC Benefit Eligibility Note (Signed)
Patient Teacher, English as a foreign language completed.    The patient is currently admitted and upon discharge could be taking Farxiga 10 mg.  The current 30 day co-pay is $47.00.   The patient is currently admitted and upon discharge could be taking Jardiance 10 mg.  The current 30 day co-pay is $47.00.   The patient is insured through [place] Patient Advocate Specialist [place] Patient Advocate Team Direct Number: [phone]  Fax: [phone]

## 2021-11-05 NOTE — Progress Notes (Signed)
Inpatient Rehabilitation  Patient information reviewed and entered into eRehab system by Aydrien Froman Lynnox Girten, OTR/L, Rehab Quality Coordinator.   Information including medical coding, functional ability and quality indicators will be reviewed and updated through discharge.   

## 2021-11-05 NOTE — Evaluation (Signed)
Physical Therapy Assessment and Plan  Patient Details  Name: James Dougherty MRN: [id number] Date of Birth: [birth year]  PT Diagnosis: Abnormality of gait, Difficulty walking, and Muscle weakness Rehab Potential: Good ELOS: 7-10 days   Today's Date: 11/05/2021 PT Individual Time: 1045-1200 PT Individual Time Calculation (min): 75 min    Hospital Problem: Principal Problem:   TBI (traumatic brain injury) [facility])   Past Medical History:  Past Medical History:  Diagnosis Date   Arthritis    "hands" (12/27/2012)   High cholesterol    Hypertension    Neuropathic pain    PAD (peripheral artery disease) ([place])    Psoriasis    Stroke ([place]) 1999   "still have some speech problems at times; sometimes forget what I was going to say" (12/27/2012)   Type II diabetes mellitus ([place])    Ulcer    Left ankle/leg   Past Surgical History:  Past Surgical History:  Procedure Laterality Date   ABDOMINAL AORTAGRAM N/A 03/30/2012   Procedure: ABDOMINAL Maxcine Ham;  Surgeon: Serafina Mitchell, MD;  Location: [facility] CATH LAB;  Service: Cardiovascular;  Laterality: N/A;   ABDOMINAL AORTAGRAM N/A 12/27/2012   Procedure: ABDOMINAL Maxcine Ham;  Surgeon: Serafina Mitchell, MD;  Location: [facility] CATH LAB;  Service: Cardiovascular;  Laterality: N/A;   ABDOMINAL AORTOGRAM W/LOWER EXTREMITY N/A 04/05/2017   Procedure: ABDOMINAL AORTOGRAM W/LOWER EXTREMITY;  Surgeon: Elam Dutch, MD;  Location: [facility] CV LAB;  Service: Cardiovascular;  Laterality: N/A;   AMPUTATION Right 04/26/2017   Procedure: AMPUTATION TRANSMETATARSAL RIGHT GREAT TOE;  Surgeon: Rosetta Posner, MD;  Location: [facility] OR;  Service: Vascular;  Laterality: Right;   ANGIOPLASTY / STENTING FEMORAL Left 53/06/1441   APPLICATION OF WOUND VAC Right 04/26/2017   Procedure: APPLICATION OF WOUND VAC;  Surgeon: Rosetta Posner, MD;  Location: [facility];  Service: Vascular;  Laterality: Right;   FEMORAL ARTERY STENT  03/30/2012   LOWER EXTREMITY ANGIOGRAM  12/27/2012    Procedure: LOWER EXTREMITY ANGIOGRAM;  Surgeon: Serafina Mitchell, MD;  Location: [facility] CATH LAB;  Service: Cardiovascular;;   LOWER EXTREMITY ANGIOGRAPHY N/A 04/19/2018   Procedure: LOWER EXTREMITY ANGIOGRAPHY;  Surgeon: Serafina Mitchell, MD;  Location: [place] CV LAB;  Service: Cardiovascular;  Laterality: N/A;   PERIPHERAL VASCULAR ATHERECTOMY Right 04/05/2017   Procedure: PERIPHERAL VASCULAR ATHERECTOMY;  Surgeon: Elam Dutch, MD;  Location: [place] CV LAB;  Service: Cardiovascular;  Laterality: Right;  superficial femoral   PERIPHERAL VASCULAR BALLOON ANGIOPLASTY  04/19/2018   Procedure: PERIPHERAL VASCULAR BALLOON ANGIOPLASTY;  Surgeon: Serafina Mitchell, MD;  Location: [place] CV LAB;  Service: Cardiovascular;;   PERIPHERAL VASCULAR INTERVENTION Right 04/05/2017   Procedure: PERIPHERAL VASCULAR INTERVENTION;  Surgeon: Elam Dutch, MD;  Location: [place] CV LAB;  Service: Cardiovascular;  Laterality: Right;   Superficial femorl and external iliac   POPLITEAL ARTERY STENT     TONSILLECTOMY AND ADENOIDECTOMY      Assessment & Plan Clinical Impression: Patient is a 71 year old male with history of HTN, T2DM with neruopathy, PAD with chronic lymphedema/wound managed by VVS/wound clinic, stroke '99, CAS, chronic systolic CHF, recent admission for hyponatremia w/cystitis 08/2021 who sustained a fall at a tire shop then noted to be acting spaced out at a stop sign. Pt says he feel backwards and hit his head. He was noted to be lethargic and found to have right sided weakness and CT head revealed acute left cerebral SDH 1.1 cm layering along left aspect  of falx with partial effacement of left lateral ventricle with 5 mm shift and signs of hyperacute/active bleeding, small subacute SDH right cerebral convexity and trace SAH. CT neck showed large protrusion of C3/C4 with moderate to severe canal stenosis and likely cord compression w/o change as well as suggestion of pulmonary edema.  He  was treated with DDAVP and platelets with recommendations of MRI C spine as noted to have hyperreflexia with paraesthesias as well reports of worsening balance.    Dr. Marcello Moores consulted and felt that patient possibly with acute with chronic SDH and recommended Keppra X 7 days as well as repeat CT head to monitor for stability.  Dr. Erlinda Hong also consulted for input on Right sided weakness and recommended MRI as well as keeping BP 130-150 range due to CAS. EEG without seizures. He was unable to tolerate attempts at MRI due to claustrophobia. Serial CT head showed stable bleed. 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 Mentation improving but continues to have bouts of lethargy with nystagmus . BP trending up and medications resumed today. To repeat CT head in 2 weeks with follow up from NS for input on resumption of DAPT. WOC consulted for input and dressings changed to Xeroform to site of healed blisters with compressive ace wraps. Therapy has been working with patient who continues to be limited by dizziness, fatigue needing frequent rest breaks, delay in processing with difficulty sequencing and STM deficits.  Patient transferred to CIR on 11/04/2021 .   Patient currently requires min with mobility secondary to muscle weakness, decreased cardiorespiratoy endurance,  , demonstrates behaviors consistent with [place], and decreased sitting balance, decreased standing balance, and decreased balance strategies.  Prior to hospitalization, patient was modified independent  with mobility and lived with Alone in a House home.  Home access is 2Stairs to enter.  Patient will benefit from skilled PT intervention to maximize safe functional mobility, minimize fall risk, and decrease caregiver burden for planned discharge home with 24 hour supervision.  Anticipate patient will benefit from follow up OP at discharge.  PT - End of Session Activity Tolerance: Tolerates 30+ min activity  with multiple rests Endurance Deficit: Yes PT Assessment Rehab Potential (ACUTE/IP ONLY): Good PT Patient demonstrates impairments in the following area(s): Balance;Motor;Pain;Safety PT Transfers Functional Problem(s): Bed Mobility;Bed to Chair;Car;Furniture PT Locomotion Functional Problem(s): Ambulation;Stairs PT Plan PT Intensity: Minimum of 1-2 x/day ,45 to 90 minutes PT Frequency: 5 out of 7 days PT Duration Estimated Length of Stay: 7-10 days PT Treatment/Interventions: Ambulation/gait training;Community reintegration;DME/adaptive equipment instruction;Neuromuscular re-education;Psychosocial support;Stair training;Wheelchair propulsion/positioning;UE/LE Strength taining/ROM;Balance/vestibular training;Discharge planning;Functional electrical stimulation;Pain management;Skin care/wound management;Therapeutic Activities;UE/LE Coordination activities;Cognitive remediation/compensation;Disease management/prevention;Functional mobility training;Patient/family education;Splinting/orthotics;Therapeutic Exercise;Visual/perceptual remediation/compensation PT Transfers Anticipated Outcome(s): Supervision PT Locomotion Anticipated Outcome(s): Supervision PT Recommendation Follow Up Recommendations: 24 hour supervision/assistance;Outpatient PT Patient destination: Home Equipment Recommended: To be determined   PT Evaluation Precautions/Restrictions Precautions Precautions: Fall Precaution Comments: x2 recent falls Restrictions Weight Bearing Restrictions: No General Chart Reviewed: Yes Family/Caregiver Present: No  Pain Interference Pain Interference Pain Effect on Sleep: 2. Occasionally Pain Interference with Therapy Activities: 2. Occasionally Pain Interference with Day-to-Day Activities: 2. Occasionally Home Living/Prior Functioning Home Living Available Help at Discharge: Family;Available 24 hours/day (Daughter and granddaughter plan to stay with pt) Type of Home: House Home  Access: Stairs to enter [employer] of Steps: 2 Entrance Stairs-Rails: Right Home Layout: One level  Lives With: Alone Prior Function Level of Independence: Independent with basic ADLs;Requires assistive device for independence  Able  to Take Stairs?: Yes Driving: Yes Vision/Perception  Vision - History Ability to See in Adequate Light: 0 Adequate Vision - Assessment Additional Comments: overall WFL and able to read menu Perception Perception: Within Functional Limits Praxis Praxis: Intact  Cognition Overall Cognitive Status: Impaired/Different from baseline Arousal/Alertness: Awake/alert Orientation Level: Oriented X4 Memory: Impaired Safety/Judgment: Appears intact [place] [employer] Scales of Cognitive Functioning: Automatic, Appropriate Sensation Sensation Light Touch: Appears Intact Coordination Gross Motor Movements are Fluid and Coordinated: Yes Fine Motor Movements are Fluid and Coordinated: No Finger Nose Finger Test: slightly slow BUE Motor  Motor Motor: Abnormal postural alignment and control  Trunk/Postural Assessment  Cervical Assessment Cervical Assessment: Exceptions to [facility] (forward head) Thoracic Assessment Thoracic Assessment: Exceptions to [facility] (rounded shoulders) Lumbar Assessment Lumbar Assessment: Exceptions to [facility] (posterior pelvic tilt) Postural Control Postural Control: Deficits on evaluation (minimally delayed)  Balance Balance Balance Assessed: Yes Standardized Balance Assessment Standardized Balance Assessment: Timed Up and Go Test Timed Up and Go Test TUG: Normal TUG (with RW) Normal TUG (seconds): 51.2 Static Sitting Balance Static Sitting - Balance Support: Feet supported Static Sitting - Level of Assistance: 5: Stand by assistance Dynamic Sitting Balance Dynamic Sitting - Balance Support: Feet supported Dynamic Sitting - Level of Assistance: 5: Stand by assistance Static Standing Balance Static Standing - Balance  Support: During functional activity;Bilateral upper extremity supported Static Standing - Level of Assistance: 4: Min assist;5: Stand by assistance Dynamic Standing Balance Dynamic Standing - Balance Support: During functional activity;Bilateral upper extremity supported Dynamic Standing - Level of Assistance: 4: Min assist Extremity Assessment  RUE Assessment RUE Assessment: Exceptions to [facility] General Strength Comments: ROM WFL 3+/5, slight discoordinataiton LUE Assessment LUE Assessment: Within Functional Limits RLE Assessment RLE Assessment: Exceptions to [facility] General Strength Comments: Hips 4/5, otherwise grossly WFL LLE Assessment LLE Assessment: Exceptions to [facility] General Strength Comments: Hips 4/5, otherwise grossly [facility]  Care Tool Care Tool Bed Mobility Roll left and right activity   Roll left and right assist level: Supervision/Verbal cueing    Sit to lying activity   Sit to lying assist level: Supervision/Verbal cueing    Lying to sitting on side of bed activity   Lying to sitting on side of bed assist level: the ability to move from lying on the back to sitting on the side of the bed with no back support.: Supervision/Verbal cueing     Care Tool Transfers Sit to stand transfer   Sit to stand assist level: Minimal Assistance - Patient > 75%    Chair/bed transfer   Chair/bed transfer assist level: Minimal Assistance - Patient > 75%     Toilet transfer   Assist Level: Minimal Assistance - Patient > 75%    Car transfer   Car transfer assist level: Minimal Assistance - Patient > 75%      Care Tool Locomotion Ambulation   Assist level: Minimal Assistance - Patient > 75%      Walk 10 feet activity   Assist level: Minimal Assistance - Patient > 75%     Walk 50 feet with 2 turns activity   Assist level: Minimal Assistance - Patient > 75%    Walk 150 feet activity Walk 150 feet activity did not occur: Safety/medical concerns      Walk 10 feet on uneven  surfaces activity   Assist level: Minimal Assistance - Patient > 75%    Stairs   Assist level: Minimal Assistance - Patient > 75%      Walk up/down 1  step activity   Walk up/down 1 step (curb) assist level: Minimal Assistance - Patient > 75%    Walk up/down 4 steps activity   Walk up/down 4 steps assist level: Minimal Assistance - Patient > 75%    Walk up/down 12 steps activity Walk up/down 12 steps activity did not occur: Safety/medical concerns      Pick up small objects from floor Pick up small object from the floor (from standing position) activity did not occur: Safety/medical concerns      Wheelchair Is the patient using a wheelchair?: No          Wheel 50 feet with 2 turns activity      Wheel 150 feet activity        Refer to Care Plan for [place]    Recommendations for other services: None   Skilled Therapeutic Intervention  Evaluation completed (see details above and below) with education on PT POC and goals and individual treatment initiated with focus on bed mobility, balance, transfers, ambulation, and stair training. Pt received seated in recliner and agrees to therapy. No complaint of pain. Pt performs sit to stand and stand pivot transfer to [facility] with minA and cues for sequencing and positioning. WC transport to gym. Pt performs car transfer with cues for sequencing and safety. Following seated rest break, pt completes ramp navigation with RW and cues to increase step height for safety. Pt ambulates x100' with RW and has forward flexed posture with shuffling gait pattern. PT provides minA as well as cues for upright gaze to improve posture and balance, increasing step height and stride length to decrease risk for falls, and maintaining proximity to RW for safety. Pt completes x8 6" steps with bilateral handrails and minA, with cues for step sequencing and foot position. Following extended seated rest break, pt performs sit<>supine on  mat table with close supervision and cues for body mechanics and PT demonstration on optimal sequencing. Pt then performs TUG with RW, recording times of 48.7, 52.1, and 52.8. WC transport back to room. Left seated in recliner with all needs within reach.   Mobility Bed Mobility Bed Mobility: Supine to Sit;Sit to Supine Supine to Sit: Supervision/Verbal cueing Sit to Supine: Supervision/Verbal cueing Transfers Transfers: Sit to Stand;Stand to Sit;Stand Pivot Transfers Sit to Stand: Minimal Assistance - Patient > 75% Stand to Sit: Minimal Assistance - Patient > 75% Stand Pivot Transfers: Minimal Assistance - Patient > 75% Stand Pivot Transfer Details: Verbal cues for safe use of DME/AE;Verbal cues for sequencing;Tactile cues for sequencing Transfer (Assistive device): Rolling walker Locomotion  Gait Ambulation: Yes Gait Assistance: Minimal Assistance - Patient > 75% Assistive device: Rolling walker;Straight cane Gait Assistance Details: Verbal cues for sequencing;Verbal cues for gait pattern;Verbal cues for safe use of DME/AE Gait Gait: Yes Gait Pattern: Impaired Gait Pattern: Shuffle;Trunk flexed Stairs / Additional Locomotion Stairs: Yes Stairs Assistance: Minimal Assistance - Patient > 75% Stair Management Technique: Two rails Height of Stairs: 6 Ramp: Minimal Assistance - Patient >75% Curb: Minimal Assistance - Patient >75% Wheelchair Mobility Wheelchair Mobility: No   Discharge Criteria: Patient will be discharged from PT if patient refuses treatment 3 consecutive times without medical reason, if treatment goals not met, if there is a change in medical status, if patient makes no progress towards goals or if patient is discharged from hospital.  The above assessment, treatment plan, treatment alternatives and goals were discussed and mutually agreed upon: by patient  Gwyndolyn Saxon  Windle Guard, PT, DPT 11/05/2021, 4:55 PM

## 2021-11-05 NOTE — Progress Notes (Addendum)
PROGRESS NOTE   Subjective/Complaints:  No acute complaints. No events overnight  ROS: Denies fevers, chills, N/V, abdominal pain, constipation, diarrhea, SOB, cough, chest pain, new weakness or paraesthesias.    Objective:   No results found. Recent Labs    11/05/21 0743  WBC 7.1  HGB 12.7*  HCT 38.7*  PLT 283   Recent Labs    11/05/21 0743  NA 141  K 4.7  CL 105  CO2 28  GLUCOSE 159*  BUN 20  CREATININE 1.59*  CALCIUM 9.1    Intake/Output Summary (Last 24 hours) at 11/05/2021 1252 Last data filed at 11/05/2021 0905 Gross per 24 hour  Intake 236 ml  Output 350 ml  Net -114 ml        Physical Exam: Vital Signs Blood pressure 123/66, pulse 87, temperature 98.4 F (36.9 C), temperature source Oral, resp. rate 16, height 5\' 8"  (1.727 m), weight 110.5 kg, SpO2 98 %. General: No apparent distress, sitting in bedside chair HEENT: Head is normocephalic, atraumatic, PERRLA, EOMI, sclera anicteric, oral mucosa pink and moist, +glasses, +HOH Neck: Supple without JVD or lymphadenopathy Heart: Reg rate and rhythm. No murmurs rubs or gallops Chest: CTA bilaterally without wheezes, rales, or rhonchi; no distress Abdomen: Soft, non-tender, mildly-distended, bowel sounds present but hypoactive Extremities: Swelling (2+ pedal edema/1+ pretibial bilaterally)  Legs wrapped with gauze and ace wrap - unchanged. +LUE IV.  Psych: Pt's affect is appropriate. Pt is cooperative Skin: dry skin in b/l, psoriatic lesions on b/l elbows and sacrum Neuro: AAOx4. Mild dysarthria noted. Delayed processing.  CN 2-12 intact. MSK: Moving all 4 limbs antigravity and against resistance  PE from prior encounters:  Strength 5/5 in b/l UE Strength 4/5 proximal, 4+/5 distal B/L LE Sensation decreased in stocking glove distribution in b/l LE DTR hyporeflexive in b/l patella, BR, Biceps         Assessment/Plan: 1. Functional deficits  which require 3+ hours per day of interdisciplinary therapy in a comprehensive inpatient rehab setting. Physiatrist is providing close team supervision and 24 hour management of active medical problems listed below. Physiatrist and rehab team continue to assess barriers to discharge/monitor patient progress toward functional and medical goals  Care Tool:  Bathing    Body parts bathed by patient: Right arm, Left arm, Chest, Abdomen, Front perineal area, Buttocks, Right upper leg, Left upper leg, Face   Body parts bathed by helper: Right lower leg, Left lower leg     Bathing assist Assist Level: Minimal Assistance - Patient > 75%     Upper Body Dressing/Undressing Upper body dressing   What is the patient wearing?: Pull over shirt    Upper body assist Assist Level: Set up assist    Lower Body Dressing/Undressing Lower body dressing      What is the patient wearing?: Pants     Lower body assist Assist for lower body dressing: Moderate Assistance - Patient 50 - 74%     Toileting Toileting    Toileting assist Assist for toileting: Minimal Assistance - Patient > 75%     Transfers Chair/bed transfer  Transfers assist     Chair/bed transfer assist level: Minimal Assistance - Patient >  75%     Locomotion Ambulation   Ambulation assist      Assist level: Minimal Assistance - Patient > 75% Assistive device: Walker-rolling Max distance: 100'   Walk 10 feet activity   Assist     Assist level: Minimal Assistance - Patient > 75% Assistive device: Walker-rolling   Walk 50 feet activity   Assist    Assist level: Minimal Assistance - Patient > 75% Assistive device: Walker-rolling    Walk 150 feet activity   Assist Walk 150 feet activity did not occur: Safety/medical concerns         Walk 10 feet on uneven surface  activity   Assist     Assist level: Minimal Assistance - Patient > 75% Assistive device: Walker-rolling    Wheelchair     Assist Is the patient using a wheelchair?: No             Wheelchair 50 feet with 2 turns activity    Assist            Wheelchair 150 feet activity     Assist          Blood pressure 123/66, pulse 87, temperature 98.4 F (36.9 C), temperature source Oral, resp. rate 16, height 5\' 8"  (1.727 m), weight 110.5 kg, SpO2 98 %.    Medical Problem List and Plan: 1. 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  Antithrombotics: -DVT/anticoagulation:  Mechanical: Sequential compression devices, below knee Bilateral lower extremities             -antiplatelet therapy: --off DAPT due to SDH 3. Pain Management:  tylenol and robaxin qid effective--not taking any prns  4. Mood/Behavior/Sleep: LCSW to follow for evaluation and support.              --trazodone prn for insomnia.              -antipsychotic agents: N/A 5. Neuropsych/cognition: This patient is capable of making decisions on his own behalf. 6. Chronic lymphedema/Skin/Wound Care: Wounds treated with Aquacell and compressive wraps per wound clinic-->continue.  7. Fluids/Electrolytes/Nutrition: Monitor I/O. Check CMET in am. 8. Seizure prophylaxis: Completed 7 day course of Keppra this am.              -Repeat head CT in 2 weeks.  9. HTN: Monitor BP TID--trending up and cozaar 12.5mg  added on 10/10 10. T2DM: Hgb A1c- 6.4 and well controlled on home regimen --will monitor BS ac/hs -->CBG running 110-130 at this time on SSI             --70/30 insulin resumed on 10/10. Will start at 5 units and titrated to home 16 units as indicated. Resume trulicity--ask family to bring from home if unavailable.  -- Monitor intake. Change Ensure to Ensure max and protein supplements. - 10/11 well controlled Recent Labs    11/04/21 2056 11/05/21 0539 11/05/21 1204  GLUCAP 128* 122*  123*      11. CKD: Baseline SCr @ 1.7. (Hyperkalemia with higher dose of ARBs.) --Improved to 1.29 with gentle hydration 10/03-10/07.  - 10/11 Cr 1.59 on intake; encourage PO fluids today, recheck in AM  12. 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              --BP goal 130-150 range for adequate cerebral perfusion.  13. Chronic systolic CHF/Abnormal stress test: Daily wts. Monitor for signs of overload.  --Cardiac cath 10/13 (was delayed due to renal issues) --On Lipitor, Zebeta, Lasix, Cozaar-->all resumed on 10/10 --Orthostatic vitals and monitor for symptoms with increase in activity.  Filed Weights   11/04/21 1546 11/04/21 1652  Weight: 110.8 kg 110.5 kg      13. H/o Hyponatremia: Due to poor intake/HCTZ per 08/17 admission.              -Na has been WNL with last Na of 137 on 10/7             - NA 141 on admission; monitor 14. ABLA: Recheck CBC in am. Last HGB 11.6 15. Dizziness: Vestibular evaluation.  16. Constipation: Has not had BM for 5 days. Will give higher dose miralax today and monitor.            - 10/11 - no BM with miralax yesterday. Increased bowel regimen to daily miralax + Sennakot S 1 tab BID. Added PRN suppository if no BM by tonight .   LOS: 1 days A FACE TO FACE EVALUATION WAS PERFORMED  Angelina Sheriff 11/05/2021, 12:52 PM

## 2021-11-05 NOTE — Progress Notes (Signed)
Inpatient Rehabilitation Discharge Medication Review by a Pharmacist  A complete drug regimen review was completed for this patient to identify any potential clinically significant medication issues.  High Risk Drug Classes Is patient taking? Indication by Medication  Antipsychotic Yes Prochlorperazine PRN nausea  Anticoagulant No   Antibiotic No   Opioid Yes Morphine PRN acute pain   Antiplatelet No   Hypoglycemics/insulin Yes Insulin 70/30 and aspart insulin for diabetes   Vasoactive Medication Yes, as an intravenous medication Metoprolol IV PRN for hypertension Bisoprolol and losartan for hypertension and HFrEF  Chemotherapy No   Other Yes Acetaminophen for acute pain Atorvastatin for hyperlipidemia  Bisacodyl, miralax and fleet enema for constipation  Diphenhydramine prn itching Guaifenesin-dextromethorphan prn cough Dulaglutide for diabetes  Furosemide for hypertension  Gabapentin for neuropathy Methocarbamol for muscle spasms Trazodone PRN sleep      Type of Medication Issue Identified Description of Issue Recommendation(s)  Drug Interaction(s) (clinically significant)     Duplicate Therapy     Allergy     No Medication Administration End Date     Incorrect Dose     Additional Drug Therapy Needed  Per Neurology, "once SDH resolves, may resume antiplatelets."  Consider adding SGLT2i for HFrEF GDMT F/u resolution of SDH and resume aspirin and clopidogrel for hx CVA and PAD  Consider adding SGLT2i for HFrEF GDMT  Significant med changes from prior encounter (inform family/care partners about these prior to discharge). Held aspirin and clopidogrel Decreased insulin dose Educate at discharge   Other       Clinically significant medication issues were identified that warrant physician communication and completion of prescribed/recommended actions by midnight of the next day:  No  Name of provider notified for urgent issues identified:   Provider Method of  Notification:     Pharmacist comments:   Time spent performing this drug regimen review (minutes):  [place], PharmD, [place], [facility] Clinical Pharmacist  Please check AMION for all [place] phone numbers After 10:00 PM, call Tat Momoli
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
## 2021-11-05 NOTE — Telephone Encounter (Signed)
Pharmacy Patient Advocate Encounter  Insurance verification completed.    The patient is insured through [employer] Medicare Part D   The patient is currently admitted and ran test claims for the following: Farxiga, Jardiance.  Copays and coinsurance results were relayed to Inpatient clinical team.  

## 2021-11-06 LAB — GLUCOSE, CAPILLARY
Glucose-Capillary: 114 mg/dL — ABNORMAL HIGH (ref 70–99)
Glucose-Capillary: 111 mg/dL — ABNORMAL HIGH (ref 70–99)
Glucose-Capillary: 106 mg/dL — ABNORMAL HIGH (ref 70–99)
Glucose-Capillary: 150 mg/dL — ABNORMAL HIGH (ref 70–99)

## 2021-11-06 LAB — BASIC METABOLIC PANEL
Anion gap: 13 (ref 5–15)
BUN: 20 mg/dL (ref 8–23)
CO2: 28 mmol/L (ref 22–32)
Calcium: 9.6 mg/dL (ref 8.9–10.3)
Chloride: 101 mmol/L (ref 98–111)
Creatinine, Ser: 1.44 mg/dL — ABNORMAL HIGH (ref 0.61–1.24)
GFR, Estimated: 52 mL/min — ABNORMAL LOW (ref >60)
Glucose, Bld: 113 mg/dL — ABNORMAL HIGH (ref 70–99)
Potassium: 4.4 mmol/L (ref 3.5–5.1)
Sodium: 142 mmol/L (ref 135–145)

## 2021-11-06 MED ORDER — DOXYCYCLINE HYCLATE 100 MG PO TABS
100.0000 mg | ORAL_TABLET | Freq: Two times a day (BID) | ORAL | Status: AC
  Administered 2021-11-06 – 2021-11-08 (×5): 100 mg via ORAL
  Filled 2021-11-06 (×5): qty 1

## 2021-11-06 MED ORDER — SODIUM CHLORIDE 0.9 % IV SOLN: INTRAVENOUS | Status: AC
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
## 2021-11-06 NOTE — Progress Notes (Signed)
Physical Therapy Session Note  Patient Details  Name: James Dougherty MRN: [id number] Date of Birth: [birth year]  Today's Date: 11/06/2021 PT Individual Time: 3016-0109 PT Individual Time Calculation (min): 75 min   Short Term Goals: Week 1:  PT Short Term Goal 1 (Week 1): STG=LTG secondary to ELOS  Skilled Therapeutic Interventions/Progress Updates:     Pt received seated in recliner and agrees to therapy. No complaint of pain. Sit to stand with cues for initiation and ambulatory transfer to [facility] with CGA and RW, with cues for sequencing and positioning. Pt voices frustration on restrictions placed on him in rehab as well as confusion on recommendations at discharge. PT provides active listening and suggests going outside to provide change of environment as well as practice ambulating over unlevel and varying surfaces. WC transport outside. PT has discussion with pt regarding therapy recommendations for 24/7 supervision following discharge due to pt's high risk for falls. Pt verbalizes understanding. Pt asks whether he will have to use RW at discharge. PT provides best estimate that therapy will recommend RW use at discharge secondary to pt's fall risk, associated with his forward flexed posture and shuffling gait pattern. PT demonstrates postural adjustments and gait mechanics to decrease risk for falls. Pt then tasked with ambulating around fountain, exaggerating step length, taking "giant" steps, while maintaining upright gaze. Pt ambulates x100' with much improved stride lengths and step heights relative to previous session, with PT providing CGA and occasional cues to maintain upright posture and stride lengths. PT has extended discussion with pt regarding importance of looking upright and utilizing peripheral vision to assess ground and potential obstacles, rather than looking down with forward flexed posture.  WC transport back inside. Pt performs balance activity, tasked with tossing ball into  trampoline and catching rebound to challenge dynamic balance, encourage trunk extension with ball overhead, and postural reactions. Pt performs 2x20 with seated rest break, with PT providing cues to shift weight anteriorly to prevent LOBs, and increased trunk extension. Pt performs 4x10 upper extremity raises with 4lb bar during rest break, with cues for body mechanics. WC transport back to room. Pt performs ambulatory transfer to toilet with CGA and cues to increase stride length. Same assist and cues for transfer back to recliner. Left seated with all needs within reach.  Therapy Documentation Precautions:  Precautions Precautions: Fall Precaution Comments: x2 recent falls Restrictions Weight Bearing Restrictions: No   Therapy/Group: Individual Therapy  Breck Coons, PT, DPT 11/06/2021, 5:44 PM

## 2021-11-06 NOTE — Progress Notes (Signed)
PROGRESS NOTE   Subjective/Complaints:  No acute complaints. No events overnight. Nursing notes some erythema and green discharge from prior R antecubital IV site; no fevers, chills, nausea/vomitting. Pt does note ongoing issues with rashes under his abdominal pannus, on his R elbow and buttocks. Uses antifungal powder under pannus at home without benefit.   ROS: Denies fevers, chills, N/V, abdominal pain, constipation, diarrhea, SOB, cough, chest pain, new weakness or paraesthesias.    Objective:   No results found. Recent Labs    11/05/21 0743  WBC 7.1  HGB 12.7*  HCT 38.7*  PLT 283    Recent Labs    11/05/21 0743 11/06/21 0545  NA 141 142  K 4.7 4.4  CL 105 101  CO2 28 28  GLUCOSE 159* 113*  BUN 20 20  CREATININE 1.59* 1.44*  CALCIUM 9.1 9.6     Intake/Output Summary (Last 24 hours) at 11/06/2021 1239 Last data filed at 11/06/2021 0719 Gross per 24 hour  Intake 773 ml  Output --  Net 773 ml         Physical Exam: Vital Signs Blood pressure (!) 114/46, pulse 72, temperature 97.9 F (36.6 C), resp. rate 16, height 5\' 8"  (1.727 m), weight 110.5 kg, SpO2 97 %. General: No apparent distress, sitting in bedside chair HEENT: Head is normocephalic, atraumatic, PERRLA, EOMI, sclera anicteric, oral mucosa pink and moist, +glasses, +HOH Neck: Supple without JVD or lymphadenopathy Heart: Reg rate and rhythm. No murmurs rubs or gallops Chest: CTA bilaterally without wheezes, rales, or rhonchi; no distress Abdomen: Soft, non-tender, mildly-distended, bowel sounds present but hypoactive Extremities: Swelling (2+ pedal edema/1+ pretibial bilaterally)  +LUE IV.  Psych: Pt's affect is appropriate. Pt is cooperative Skin: dry skin in b/l, psoriatic lesions on b/l elbows and sacrum, Legs appear chronic skin discoloration but blistering now healed - covered in kerlex and ACE. RUE antecubital fossa with erythema and  small palpable abscess; no active drainage.       Neuro: AAOx4. Mild dysarthria noted. Delayed processing. CN 2-12 intact. MSK: Moving all 4 limbs antigravity and against resistance       Assessment/Plan: 1. Functional deficits which require 3+ hours per day of interdisciplinary therapy in a comprehensive inpatient rehab setting. Physiatrist is providing close team supervision and 24 hour management of active medical problems listed below. Physiatrist and rehab team continue to assess barriers to discharge/monitor patient progress toward functional and medical goals  Care Tool:  Bathing    Body parts bathed by patient: Right arm, Left arm, Chest, Abdomen, Front perineal area, Buttocks, Right upper leg, Left upper leg, Face   Body parts bathed by helper: Right lower leg, Left lower leg     Bathing assist Assist Level: Minimal Assistance - Patient > 75%     Upper Body Dressing/Undressing Upper body dressing   What is the patient wearing?: Pull over shirt    Upper body assist Assist Level: Set up assist    Lower Body Dressing/Undressing Lower body dressing      What is the patient wearing?: Pants     Lower body assist Assist for lower body dressing: Moderate Assistance - Patient 50 - 74%  Toileting Toileting    Toileting assist Assist for toileting: Minimal Assistance - Patient > 75%     Transfers Chair/bed transfer  Transfers assist     Chair/bed transfer assist level: Minimal Assistance - Patient > 75%     Locomotion Ambulation   Ambulation assist      Assist level: Minimal Assistance - Patient > 75% Assistive device: Walker-rolling Max distance: 100'   Walk 10 feet activity   Assist     Assist level: Minimal Assistance - Patient > 75% Assistive device: Walker-rolling   Walk 50 feet activity   Assist    Assist level: Minimal Assistance - Patient > 75% Assistive device: Walker-rolling    Walk 150 feet activity   Assist Walk  150 feet activity did not occur: Safety/medical concerns         Walk 10 feet on uneven surface  activity   Assist     Assist level: Minimal Assistance - Patient > 75% Assistive device: Walker-rolling   Wheelchair     Assist Is the patient using a wheelchair?: No             Wheelchair 50 feet with 2 turns activity    Assist            Wheelchair 150 feet activity     Assist          Blood pressure (!) 114/46, pulse 72, temperature 97.9 F (36.6 C), resp. rate 16, height 5\' 8"  (1.727 m), weight 110.5 kg, SpO2 97 %.    Medical Problem List and Plan: 1. 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  Antithrombotics: -DVT/anticoagulation:  Mechanical: Sequential compression devices, below knee Bilateral lower extremities             -antiplatelet therapy: --off DAPT due to SDH 3. Pain Management:  tylenol and robaxin qid effective--not taking any prns  4. Mood/Behavior/Sleep: LCSW to follow for evaluation and support.              --trazodone prn for insomnia.              -antipsychotic agents: N/A 5. Neuropsych/cognition: This patient is capable of making decisions on his own behalf. 6. Chronic lymphedema/Skin/Wound Care: Wounds treated with Aquacell and compressive wraps per wound clinic-->continue.             - Increase wrap to 1 inch below knees for edema management            - Emla steroid cream to R elbow, gluteal psoriatic lesions. Notified nursing to remove sacral heart as note DTI.            - RUE abscess at former IV line - start Doxy 50 mg BID for MRSA coverage x5 days  7. Fluids/Electrolytes/Nutrition: Monitor I/O. Check CMET in am.         - 10/12 - Mild AKI, encouraging PO fluids, gentle IVF 75/hr today. Recheck in AM.  8. Seizure prophylaxis: Completed 7 day course of Keppra this am.              -Repeat  head CT in 2 weeks.  9. HTN: Monitor BP TID--trending up and cozaar 12.5mg  added  on 10/10 10. T2DM: Hgb A1c- 6.4 and well controlled on home regimen --will monitor BS ac/hs -->CBG running 110-130 at this time on SSI             --70/30 insulin resumed on 10/10. Will start at 5 units and titrated to home 16 units as indicated. Resume trulicity--ask family to bring from home if unavailable.  -- Monitor intake. Change Ensure to Ensure max and protein supplements. - 10/11 well controlled Recent Labs    11/05/21 2037 11/06/21 0604 11/06/21 1144  GLUCAP 143* 114* 111*       11. CKD: Baseline SCr @ 1.7. (Hyperkalemia with higher dose of ARBs.) --Improved to 1.29 with gentle hydration 10/03-10/07.  - 10/11 Cr 1.59 on intake; encourage PO fluids today, recheck in AM  12. R>L CAS: In process of work up for [employer].              --BP goal 130-150 range for adequate cerebral perfusion.     11/05/2021    9:32 PM 11/05/2021    3:24 PM 11/05/2021    5:45 AM  Vitals with BMI  Systolic [phone]  Diastolic 46 55 66  Pulse 72 65 87     13. Chronic systolic CHF/Abnormal stress test: Daily wts. Monitor for signs of overload.  --Cardiac cath 10/13 (was delayed due to renal issues) --On Lipitor, Zebeta, Lasix, Cozaar-->all resumed on 10/10 --Orthostatic vitals and monitor for symptoms with increase in activity.  Filed Weights   11/04/21 1546 11/04/21 1652  Weight: 110.8 kg 110.5 kg              - 10/12 - daily weights. Started gentle IVF 75 cc/hr today for ongoing AKI, continue lasix for LE edema.   13. H/o Hyponatremia: Due to poor intake/HCTZ per 08/17 admission.              -Na has been WNL with last Na of 137 on 10/7             - NA 141 on admission; monitor  14. ABLA: Recheck CBC in am. Last HGB 11.6 15. Dizziness: Vestibular evaluation.  16. Constipation: Has not had BM for 5 days. Will give higher dose miralax today and monitor.            - 10/11 - no BM with miralax yesterday.  Increased bowel regimen to daily miralax + Sennakot S 1 tab BID. Added PRN suppository if no BM by tonight .            - LBM 10/11, small  LOS: 2 days A FACE TO FACE EVALUATION WAS PERFORMED  Gertie Gowda 11/06/2021, 12:39 PM

## 2021-11-06 NOTE — Progress Notes (Signed)
Speech Language Pathology Daily Session Note  Patient Details  Name: KAVAN DEVAN MRN: [id number] Date of Birth: 14-Aug-1950  Today's Date: 11/06/2021 SLP Individual Time: 2706-2376 SLP Individual Time Calculation (min): 40 min  Short Term Goals: Week 1: SLP Short Term Goal 1 (Week 1): STGs=LTGs due to ELOS  Skilled Therapeutic Interventions: Skilled treatment session focused on cognitive goals. Upon arrival, patient requested to use the bathroom. Patient ambulated with the RW and was overall Mod I for safety throughout task. SLP facilitated session by providing intermittent supervision level verbal cues for problem solving during a complex money management task. Patient was overall Mod I for error awareness. At end of session, patient requested to have the alarm belt removed, SLP provided education regarding safety procedures. Patient left upright in the recliner with alarm on and all needs within reach. Continue with current plan of care.      Pain No/Denies Pain   Therapy/Group: Individual Therapy  Gershom Brobeck 11/06/2021, 3:24 PM

## 2021-11-06 NOTE — Care Management (Signed)
Inpatient Rehabilitation Center Individual Statement of Services  Patient Name:  James Dougherty  Date:  11/06/2021  Welcome to the [facility].  Our goal is to provide you with an individualized program based on your diagnosis and situation, designed to meet your specific needs.  With this comprehensive rehabilitation program, you will be expected to participate in at least 3 hours of rehabilitation therapies Monday-Friday, with modified therapy programming on the weekends.  Your rehabilitation program will include the following services:  Physical Therapy (PT), Occupational Therapy (OT), Speech Therapy (ST), 24 hour per day rehabilitation nursing, Therapeutic Recreaction (TR), Psychology, Neuropsychology, Care Coordinator, Rehabilitation Medicine, [place], and Other  Weekly team conferences will be held on Tuesdays to discuss your progress.  Your Inpatient Rehabilitation Care Coordinator will talk with you frequently to get your input and to update you on team discussions.  Team conferences with you and your family in attendance may also be held.  Expected length of stay: 7-10 days    Overall anticipated outcome: Supervision to Independent at [facility]  Depending on your progress and recovery, your program may change. Your Inpatient Rehabilitation Care Coordinator will coordinate services and will keep you informed of any changes. Your Inpatient Rehabilitation Care Coordinator's name and contact numbers are listed  below.  The following services may also be recommended but are not provided by the [facility] will be made to provide these services after discharge if needed.  Arrangements include referral to agencies that provide these services.  Your insurance has been verified to  be:  [facility] Medicare  Your primary doctor is:  Consuello Masse  Pertinent information will be shared with your doctor and your insurance company.  Inpatient Rehabilitation Care Coordinator:  Cathleen Corti [phone] or ([phone]  Information discussed with and copy given to patient by: Rana Snare, 11/06/2021, 1:23 PM

## 2021-11-06 NOTE — Progress Notes (Signed)
Physical Therapy Session Note  Patient Details  Name: James Dougherty MRN: [id number] Date of Birth: 27-Mar-1950  Today's Date: 11/06/2021 PT Individual Time: 0034-9179 PT Individual Time Calculation (min): 30 min   Short Term Goals: Week 1:  PT Short Term Goal 1 (Week 1): STG=LTG secondary to ELOS  Skilled Therapeutic Interventions/Progress Updates:  Patient greeted sitting in bedside recliner in room and agreeable to PT treatment session. Patient performed sit/stand with RW and SBA for safety. Patient gait trained x65' with RW from room to rehab gym with SBA/CGA for safety. VC for improved B step length (L>R) and foot clearance with good improvements noted, however unable to sustain as he fatigues. Patient required increased time to complete gait trial with one standing rest break. Extended seated rest break required after gait trial secondary to impaired endurance/activity tolerance.    Patient stood with RW and perfomed alternating toe taps to 6" step with CGA/SBA- x20 performed total.   Patient stood with RW and performed alternating toe taps to 6" step with 2# weight donned to B ankles- x20 total. Patient with increased difficulty lifting L LE compared to R, however still able to complete exercise.   Patient performed sit/stand and stand pivot transfer to wheelchair with RW and SBA. Patient wheeled back to room for time management.   Patient performed sit/stand from wc and ambulated ~10' to bedside recliner with RW and SBA. Patient left sitting in bedside recliner with posey belt on, call bell within reach and all needs met.    Therapy Documentation Precautions:  Precautions Precautions: Fall Precaution Comments: x2 recent falls Restrictions Weight Bearing Restrictions: No  Therapy/Group: Individual Therapy  [name] 11/06/2021, 12:15 PM

## 2021-11-06 NOTE — Progress Notes (Addendum)
Occupational Therapy Session Note  Patient Details  Name: James Dougherty MRN: [id number] Date of Birth: Oct 22, 1950  Today's Date: 11/06/2021 OT Individual Time: 0950-1100 OT Individual Time Calculation (min): 70 min    Short Term Goals: Week 1:  OT Short Term Goal 1 (Week 1): STG=LTG d/t ELOS  Skilled Therapeutic Interventions/Progress Updates:    Pt greeted seated in recliner and agreeable to OT treatment session focused on self-care retraining, activity tolerance, and balance. Pt ambulated to the bathroom w/ RW and CGA. OT assist to cover B LE wound dressings and L IV. Bathing completed sit<>stand from bench in shower with min A to wash buttocks..Dressing tasks completed from recliner with CGA for balance. OT assisted with applying anti fungal powder. OT noted redness and some green puss around old IV site on R arm. Nursing informed and assessed. Pt ambulated to therapy gym w/ RW and increased time with 2 standing rest breaks. Cues for taking bigger steps. Worked on increased hip and knee extension and weight shifting in standing with standing alternating toe taps on cones. Pt completed 2 sets of 10. Extended rest break, then pt ambulated back to room in similar fashion with RW and 2 rest breaks. Pt left seated in recliner with alarm belt on, call bell in reach, and needs met.  Therapy Documentation Precautions:  Precautions Precautions: Fall Precaution Comments: x2 recent falls Restrictions Weight Bearing Restrictions: No Pain:  Denies pain   Therapy/Group: Individual Therapy  Valma Cava 11/06/2021, 10:45 AM

## 2021-11-07 ENCOUNTER — Ambulatory Visit (HOSPITAL_COMMUNITY): Admit: 2021-11-07 | Payer: Medicare Other | Admitting: Cardiology

## 2021-11-07 ENCOUNTER — Encounter (HOSPITAL_COMMUNITY): Payer: Self-pay

## 2021-11-07 LAB — GLUCOSE, CAPILLARY
Glucose-Capillary: 102 mg/dL — ABNORMAL HIGH (ref 70–99)
Glucose-Capillary: 124 mg/dL — ABNORMAL HIGH (ref 70–99)
Glucose-Capillary: 94 mg/dL (ref 70–99)
Glucose-Capillary: 126 mg/dL — ABNORMAL HIGH (ref 70–99)
Glucose-Capillary: 134 mg/dL — ABNORMAL HIGH (ref 70–99)

## 2021-11-07 LAB — BASIC METABOLIC PANEL
Anion gap: 10 (ref 5–15)
BUN: 23 mg/dL (ref 8–23)
CO2: 25 mmol/L (ref 22–32)
Calcium: 9.2 mg/dL (ref 8.9–10.3)
Chloride: 104 mmol/L (ref 98–111)
Creatinine, Ser: 1.48 mg/dL — ABNORMAL HIGH (ref 0.61–1.24)
GFR, Estimated: 50 mL/min — ABNORMAL LOW (ref >60)
Glucose, Bld: 120 mg/dL — ABNORMAL HIGH (ref 70–99)
Potassium: 4.4 mmol/L (ref 3.5–5.1)
Sodium: 139 mmol/L (ref 135–145)

## 2021-11-07 SURGERY — RIGHT/LEFT HEART CATH AND CORONARY ANGIOGRAPHY
Anesthesia: LOCAL

## 2021-11-07 MED ORDER — METHOCARBAMOL 750 MG PO TABS
750.0000 mg | ORAL_TABLET | Freq: Three times a day (TID) | ORAL | Status: DC
  Administered 2021-11-07 – 2021-11-14 (×21): 750 mg via ORAL
  Filled 2021-11-07 (×22): qty 1

## 2021-11-07 NOTE — IPOC Note (Signed)
Overall Plan of Care [facility]) Patient Details Name: James Dougherty MRN: [id number] DOB: 30-Oct-1950  Admitting Diagnosis: TBI (traumatic brain injury) [facility])  Hospital Problems: Principal Problem:   TBI (traumatic brain injury) (HCC)     Functional Problem List: Nursing Bowel, Medication Management, Safety, Pain, Skin Integrity, Endurance  PT Balance, Motor, Pain, Safety  OT Balance, Cognition, Endurance, Safety, Vision  SLP Behavior, Cognition, Nutrition, Linguistic  TR         Basic ADL's: OT Grooming, Bathing, Dressing, Toileting     Advanced  ADL's: OT Simple Meal Preparation     Transfers: PT Bed Mobility, Bed to Chair, Car, Occupational psychologist, Research scientist (life sciences): PT Ambulation, Stairs     Additional Impairments: OT    SLP Communication, Social Cognition expression Problem Solving, Awareness  TR      Anticipated Outcomes Item Anticipated Outcome  Self Feeding no goal  Swallowing      Basic self-care  MOD I dressing; S shower  Toileting  MOD I   Bathroom Transfers S shower; MOD I toilet  Bowel/Bladder  manage bowel w mod I assist  Transfers  Supervision  Locomotion  Supervision  Communication     Cognition     Pain  < 4 with prns  Safety/Judgment  manage safety w cues   Therapy Plan: PT Intensity: Minimum of 1-2 x/day ,45 to 90 minutes PT Frequency: 5 out of 7 days PT Duration Estimated Length of Stay: 7-10 days OT Intensity: Minimum of 1-2 x/day, 45 to 90 minutes OT Frequency: 5 out of 7 days OT Duration/Estimated Length of Stay: 7-10 days SLP Intensity: Minumum of 1-2 x/day, 30 to 90 minutes SLP Frequency: 3 to 5 out of 7 days SLP Duration/Estimated Length of Stay: 7-10 days   Team Interventions: Nursing Interventions Disease Management/Prevention, Bowel Management, Patient/Family Education, Pain Management, Skin Care/Wound Management, Medication Management, Dysphagia/Aspiration Precaution Training, Discharge Planning,  Cognitive Remediation/Compensation  PT interventions Ambulation/gait training, Community reintegration, DME/adaptive equipment instruction, Neuromuscular re-education, Psychosocial support, Stair training, Wheelchair propulsion/positioning, UE/LE Strength taining/ROM, Warden/ranger, Discharge planning, Functional electrical stimulation, Pain management, Skin care/wound management, Therapeutic Activities, UE/LE Coordination activities, Cognitive remediation/compensation, Disease management/prevention, Functional mobility training, Patient/family education, Splinting/orthotics, Therapeutic Exercise, Visual/perceptual remediation/compensation  OT Interventions Balance/vestibular training, Discharge planning, Pain management, Self Care/advanced ADL retraining, Therapeutic Activities, UE/LE Coordination activities, Visual/perceptual remediation/compensation, Therapeutic Exercise, Skin care/wound managment, Patient/family education, Functional mobility training, Disease mangement/prevention, Cognitive remediation/compensation, Community reintegration, Fish farm manager, Neuromuscular re-education, Psychosocial support, Splinting/orthotics, UE/LE Strength taining/ROM, Wheelchair propulsion/positioning  SLP Interventions Dysphagia/aspiration precaution training, Cognitive remediation/compensation, Internal/external aids, Financial trader, Environmental controls, Therapeutic Activities, Functional tasks, Patient/family education, Speech/Language facilitation  TR Interventions    SW/CM Interventions Discharge Planning, Psychosocial Support, Patient/Family Education   Barriers to Discharge MD  Medical stability, Home enviroment access/loayout, Wound care, and Lack of/limited family support  Nursing Decreased caregiver support, Home environment access/layout 1 level 2 ste right rail, has RW, SS and grab bars; lived solo PTA; dtr to assist at discharge  PT      OT Decreased caregiver  support unsure how long family can provide 24/7 S  SLP      SW       Team Discharge Planning: Destination: PT-Home ,OT- Home , SLP-Home Projected Follow-up: PT-24 hour supervision/assistance, Outpatient PT, OT-  Home health OT, SLP-24 hour supervision/assistance, Home Health SLP, Outpatient SLP Projected Equipment Needs: PT-To be determined, OT- To be determined, SLP-None recommended by SLP Equipment Details: PT- , OT-maybe  TTB Patient/family involved in discharge planning: PT- Patient,  OT-Patient, SLP-Patient  MD ELOS: 10-14 days Medical Rehab Prognosis:  Excellent Assessment: The patient has been admitted for CIR therapies with the diagnosis of traumatic brain injury. The team will be addressing functional mobility, strength, stamina, balance, safety, adaptive techniques and equipment, self-care, bowel and bladder mgt, patient and caregiver education. Goals have been set at [facility]. Anticipated discharge destination is home with family.       See Team Conference Notes for weekly updates to the plan of care

## 2021-11-07 NOTE — Progress Notes (Signed)
Inpatient Rehabilitation Care Coordinator Assessment and Plan Patient Details  Name: James Dougherty MRN: [id number] Date of Birth: Jun 17, 1950  Today's Date: 11/07/2021  Hospital Problems: Principal Problem:   TBI (traumatic brain injury) [facility])  Past Medical History:  Past Medical History:  Diagnosis Date   Arthritis    "hands" (12/27/2012)   High cholesterol    Hypertension    Neuropathic pain    PAD (peripheral artery disease) ([place])    Psoriasis    Stroke ([place]) 1999   "still have some speech problems at times; sometimes forget what I was going to say" (12/27/2012)   Type II diabetes mellitus ([place])    Ulcer    Left ankle/leg   Past Surgical History:  Past Surgical History:  Procedure Laterality Date   ABDOMINAL AORTAGRAM N/A 03/30/2012   Procedure: ABDOMINAL Maxcine Ham;  Surgeon: Serafina Mitchell, MD;  Location: [facility] CATH LAB;  Service: Cardiovascular;  Laterality: N/A;   ABDOMINAL AORTAGRAM N/A 12/27/2012   Procedure: ABDOMINAL Maxcine Ham;  Surgeon: Serafina Mitchell, MD;  [facility] CATH LAB;  Service: Cardiovascular;  Laterality: N/A;   ABDOMINAL AORTOGRAM W/LOWER EXTREMITY N/A 04/05/2017   Procedure: ABDOMINAL AORTOGRAM W/LOWER EXTREMITY;  Surgeon: Elam Dutch, MD;  Location: [place] CV LAB;  Service: Cardiovascular;  Laterality: N/A;   AMPUTATION Right 04/26/2017   Procedure: AMPUTATION TRANSMETATARSAL RIGHT GREAT TOE;  Surgeon: Rosetta Posner, MD;  Location: [facility] OR;  Service: Vascular;  Laterality: Right;   ANGIOPLASTY / STENTING FEMORAL Left 51/07/15   APPLICATION OF WOUND VAC Right 04/26/2017   Procedure: APPLICATION OF WOUND VAC;  Surgeon: Rosetta Posner, MD;  Location: [facility];  Service: Vascular;  Laterality: Right;   FEMORAL ARTERY STENT  03/30/2012   LOWER EXTREMITY ANGIOGRAM  12/27/2012   Procedure: LOWER EXTREMITY ANGIOGRAM;  Surgeon: Serafina Mitchell, MD;  Location: [facility] CATH LAB;  Service: Cardiovascular;;   LOWER EXTREMITY ANGIOGRAPHY N/A 04/19/2018   Procedure:  LOWER EXTREMITY ANGIOGRAPHY;  Surgeon: Serafina Mitchell, MD;  Location: [place] CV LAB;  Service: Cardiovascular;  Laterality: N/A;   PERIPHERAL VASCULAR ATHERECTOMY Right 04/05/2017   Procedure: PERIPHERAL VASCULAR ATHERECTOMY;  Surgeon: Elam Dutch, MD;  Location: [place] CV LAB;  Service: Cardiovascular;  Laterality: Right;  superficial femoral   PERIPHERAL VASCULAR BALLOON ANGIOPLASTY  04/19/2018   Procedure: PERIPHERAL VASCULAR BALLOON ANGIOPLASTY;  Surgeon: Serafina Mitchell, MD;  Location: [place] CV LAB;  Service: Cardiovascular;;   PERIPHERAL VASCULAR INTERVENTION Right 04/05/2017   Procedure: PERIPHERAL VASCULAR INTERVENTION;  Surgeon: Elam Dutch, MD;  Location: [place] CV LAB;  Service: Cardiovascular;  Laterality: Right;   Superficial femorl and external iliac   POPLITEAL ARTERY STENT     TONSILLECTOMY AND ADENOIDECTOMY     Social History:  reports that he quit smoking about 10 years ago. His smoking use included cigarettes. He has a 135.00 pack-year smoking history. He has never been exposed to tobacco smoke. He has never used smokeless tobacco. He reports that he does not currently use alcohol. He reports that he does not use drugs.  Family / Support Systems Marital Status: Single Patient Roles: Parent Spouse/Significant Other: N/A Children: DtrTerrence Dupont Other Supports: None reported Anticipated Caregiver: Dtr [name] Ability/Limitations of Caregiver: Pt dtr [name] lives in [place] and works from home. She has offered for pt to come to her home if he will accept as he is very independent. Caregiver Availability: 24/7 Family Dynamics: Pt lives alone.  Social History Preferred language: [place]  Religion: Baptist Cultural Background: Pt worked as a Administrator for 40 yrs. Education: some Medical sales representative - How often do you need to have someone help you when you read instructions, pamphlets, or other written material from your doctor or pharmacy?:  Never Writes: Yes Employment Status: Retired Date Retired/Disabled/Unemployed: Jan 2014 Legal History/Current Legal Issues: Denies Guardian/Conservator: N/A   Abuse/Neglect Abuse/Neglect Assessment Can Be Completed: Yes Physical Abuse: Denies Verbal Abuse: Denies Sexual Abuse: Denies Exploitation of patient/patient's resources: Denies Self-Neglect: Denies  Patient response to: Social Isolation - How often do you feel lonely or isolated from those around you?: Never  Emotional Status Pt's affect, behavior and adjustment status: Pt in good spirits at time of visit. He is very adamant about maintaining his independence. He struggles with accepting assistance. SW reiterated the importance of supervision until he is cleared to be home alone due to his balance. Recent Psychosocial Issues: Denies Psychiatric History: Denies Substance Abuse History: Pt admits that he quit smoking cigarettes in 2012 after becoming sick. States this was his second time quitting after ceasing use in 1988 for 8 yrs. He denies etoh and rec drug use.  Patient / Family Perceptions, Expectations & Goals Pt/Family understanding of illness & functional limitations: Pt and family have a general understanding of care needs Premorbid pt/family roles/activities: Independent Anticipated changes in roles/activities/participation: Assistance with ADLs/IADLs Pt/family expectations/goals: Pt goal is to work on "be able to get to walk without aRW and be independent."  [employer]: None Premorbid Home Care/DME Agencies: None Transportation available at discharge: Pt dtr Is the patient able to respond to transportation needs?: Yes In the past 12 months, has lack of transportation kept you from medical appointments or from getting medications?: No In the past 12 months, has lack of transportation kept you from meetings, work, or from getting things needed for daily living?: No Resource referrals  recommended: Neuropsychology  Discharge Planning Living Arrangements: Children Support Systems: Children Type of Residence: Private residence Insurance Resources: Multimedia programmer (specify) ([employer] Medicare) Financial Resources: Social Security Financial Screen Referred: No Living Expenses: Own Money Management: Patient Does the patient have any problems obtaining your medications?: No Home Management: Pt manages all homecare needs Patient/Family Preliminary Plans: TBD Care Coordinator Barriers to Discharge: Decreased caregiver support, Lack of/limited family support Care Coordinator Anticipated Follow Up Needs: HH/OP Expected length of stay: 7-10 days  Clinical Impression SW met with pt in room to introduce self, explain role, and discuss discharge process. Pt is not a English as a second language teacher. NO HCPOA. DME: RW, shower chair, grab bars in shower and also a grab bar installed in car port near stairs- 2STE into home. When discussing with pt [facility] vs outpatient, he understands SW will follow-up with therapist recommendations. Pt was concerned about changing PCP. SW explained typically when relocating to another area, this is the only way for a state to accept orders from a local physician. SW shared this would not be a barrier as his dtr is willing to drive him to an outpatient location if needed since she is only [place] minutes from [employer]. SW confirmed with pt this will be a discussion and he will be involved in decision making process. States if [facility] is needed, prefers [facility] as he has had in the past.   Rana Snare 11/07/2021, 9:25 PM

## 2021-11-07 NOTE — Progress Notes (Signed)
Physical Therapy Session Note  Patient Details  Name: James Dougherty MRN: [id number] Date of Birth: 05/27/1950  Today's Date: 11/07/2021 PT Individual Time: 1103-1207 PT Individual Time Calculation (min): 64 min   Short Term Goals: Week 1:  PT Short Term Goal 1 (Week 1): STG=LTG secondary to ELOS  Skilled Therapeutic Interventions/Progress Updates:      Therapy Documentation Precautions:  Precautions Precautions: Fall Precaution Comments: x2 recent falls Restrictions Weight Bearing Restrictions: No   Pt received seated in recliner at bedside, declines pain and agreeable to PT session with emphasis on gait training, various level transfer training and neuromuscular re-education. Pt ambulated total of ~400 ft during session with RW and SBA. Pt requires verbal cueing of "big" to increase step length with gait. Pt maintains upright posture and elevated gaze during gait training. Pt particpated in blocked practice of sit <>stand transfers x 10. Pt with poor eccentric control with stand to sit and with verbal cueing able to demonstrate safe and appropriate descent. Pt also performed 5 transfers from low level soft couch to simulate home environment and pt required SBA. Pt performed 4 inch bilateral LE step taps to address decreased foot clearance of L LE with gait. Pt returned to room and requested to utilize toilet, +BM and Supervision for peri-care and toilet transfer. Pt left seated in recliner at bedside with alarm on and all needs within reach.    Therapy/Group: Individual Therapy  Verl Dicker Verl Dicker PT, DPT  11/07/2021, 11:54 AM

## 2021-11-07 NOTE — Progress Notes (Signed)
Physical Therapy Session Note  Patient Details  Name: James Dougherty MRN: [id number] Date of Birth: Dec 08, 1950  Today's Date: 11/07/2021 PT Individual Time: 5916-3846 PT Individual Time Calculation (min): 28 min   Short Term Goals: Week 1:  PT Short Term Goal 1 (Week 1): STG=LTG secondary to ELOS  Skilled Therapeutic Interventions/Progress Updates:  Patient seated upright in recliner on entrance to room. MD completing rounding on pt. Patient alert and agreeable to PT session.   Patient with no pain complaint at start of session.  Therapeutic Activity: Transfers: Pt performed sit<>stand and stand pivot transfers throughout session with CGA/MinA from various heights and seats. Provided verbal cues for technique especially in forward lean and BUE push forward from seat.   Gait Training:  Pt ambulated 200' x2 using RW for improved balance with CGA. Demonstrated increased awareness of step length/ height with no shuffling gait pattern. Relates previous session with increased awareness of increasing stride. Provided vc/ tc for level gaze and upright posture with decreased downward pressure into RW.  Therapeutic Exercise: Pt guided in continuous reciprocation of BUE and BLE using NuStep L4 x 86min  and L3 x 35min with focus on Bil knee and hip extension. Pt maintains 58-60 steps/min at 2.5 METs. At end of therex, pt's resp rate is 36 breaths/min and relates RPE of 6/10. After 26min, resp rate decreases to 18 breaths/ min.  Patient seated in recliner at end of session with brakes locked, belt alarm set, and all needs within reach.   Therapy Documentation Precautions:  Precautions Precautions: Fall Precaution Comments: x2 recent falls Restrictions Weight Bearing Restrictions: No General:   Vital Signs:  Pain:  No pain complaint this session.   Therapy/Group: Individual Therapy  Alger Simons PT, DPT, CSRS 11/07/2021, 6:30 PM

## 2021-11-07 NOTE — Progress Notes (Signed)
Occupational Therapy Session Note  Patient Details  Name: HYMEN ARNETT MRN: [id number] Date of Birth: December 01, 1950  Today's Date: 11/07/2021 OT Individual Time: 0845-1000 OT Individual Time Calculation (min): 75 min    Short Term Goals: Week 1:  OT Short Term Goal 1 (Week 1): STG=LTG d/t ELOS  Skilled Therapeutic Interventions/Progress Updates: Patient seen for self care skills as listed below. Patient able to perform tasks at a walker level. Displayed good safety and motivation to regain independence.      Therapy Documentation Precautions:  Precautions Precautions: Fall Precaution Comments: x2 recent falls Restrictions Weight Bearing Restrictions: No    Pain:No c/o pain   ADL: ADL Eating: Set up Where Assessed-Eating: Edge of bed Grooming: Set-up Where Assessed-Grooming: Standing at sink Upper Body Bathing: Supervision/safety Where Assessed-Upper Body Bathing: Shower Lower Body Bathing: Unable to wash B feet due to dressing Where Assessed-Lower Body Bathing: Shower Upper Body Dressing: Supervision/safety Where Assessed-Upper Body Dressing: Edge of bed Lower Body Dressing: CGA Where Assessed-Lower Body Dressing: Edge of bed Toileting: Minimal assistance Where Assessed-Toileting: toilet Toilet Transfer: CGA/SBA Shower Transfer: CGA/SBA Vision Uses glasses at all times   Balance Needs walker for assist in standing   Therapy/Group: Individual Therapy  Hermina Barters 11/07/2021, 12:30 PM

## 2021-11-07 NOTE — Progress Notes (Signed)
Speech Language Pathology Discharge Summary  Patient Details  Name: James Dougherty MRN: [id number] Date of Birth: [birth year]  Date of Discharge from [place] 13, 2023  Today's Date: 11/07/2021 SLP Individual Time: 1415-1500 SLP Individual Time Calculation (min): 45 min   Skilled Therapeutic Interventions:  Skilled treatment session focused on cognitive goals. Patient independently recalled his current medications and was asking appropriate questions regarding need for pain medicine. Patient was educated to speak to the doctor regarding his concerns. Patient independently recalled events from previous therapy sessions and reported that he feels that his speech intelligibility is at baseline. SLP spoke to patient's daughter via the telephone who reports his speech is actually better than it was prior to the fall. Patient's daughter also reports that he is at his cognitive baseline and feels that delayed processing is due to hearing deficits. SLP agreed with his daughter but also provided education regarding recommendation for 24 hour supervision due to his high fall risk. She verbalized understanding. Both the patient and his daughter were in agreement for discharge from skilled SLP intervention. Patient left upright in the recliner with alarm on and all needs within reach. Continue with current plan of care.      Patient has met 3 of 3 long term goals.  Patient to discharge at overall Modified Independent level.   Reasons goals not met: N/A   Clinical Impression/Discharge Summary: Patient has made functional gains and has met 3 of 3 LTGs  this admission. Currently, patient is overall Mod I to complete functional and familiar tasks safely in regards to problem solving and awareness. Patient has baseline level speech impairments which have actually improved since admission per his family. Patient continues to demonstrate decreased processing speed at times, suspect this is due to patient  being hard of hearing. Due to both the patient and his daughter both reporting the patient is at his baseline level of cognitive functioning and functional communication, patient will be discharged from skilled SLP intervention with f/u not warranted at this time.   Care Partner:  Caregiver Able to Provide Assistance: Yes  Type of Caregiver Assistance: Physical  Recommendation:  24 hour supervision/assistance      Equipment: N/A   Reasons for discharge: Treatment goals met   Patient/Family Agrees with Progress Made and Goals Achieved: Yes    Lasaro Primm 11/07/2021, 3:17 PM

## 2021-11-07 NOTE — Progress Notes (Signed)
PROGRESS NOTE   Subjective/Complaints:  No acute complaints. No events overnight. Pt seen briefly while showering with OT.  ROS: Denies fevers, chills, N/V, abdominal pain, constipation, diarrhea, SOB, cough, chest pain, new weakness or paraesthesias.    Objective:   No results found. Recent Labs    11/05/21 0743  WBC 7.1  HGB 12.7*  HCT 38.7*  PLT 283    Recent Labs    11/06/21 0545 11/07/21 0609  NA 142 139  K 4.4 4.4  CL 101 104  CO2 28 25  GLUCOSE 113* 120*  BUN 20 23  CREATININE 1.44* 1.48*  CALCIUM 9.6 9.2     Intake/Output Summary (Last 24 hours) at 11/07/2021 1143 Last data filed at 11/07/2021 0801 Gross per 24 hour  Intake 474.27 ml  Output 650 ml  Net -175.73 ml         Physical Exam: Vital Signs Blood pressure 128/64, pulse 64, temperature 98.1 F (36.7 C), resp. rate 17, height 5\' 8"  (1.727 m), weight 107 kg, SpO2 97 %. General: No apparent distress, sitting in bedside chair HEENT: Head is normocephalic, atraumatic, PERRLA, EOMI, sclera anicteric, oral mucosa pink and moist, +glasses, +HOH Neck: Supple without JVD or lymphadenopathy Heart: Reg rate and rhythm. No murmurs rubs or gallops Chest: CTA bilaterally without wheezes, rales, or rhonchi; no distress Abdomen: Soft, non-tender, nondistended, bowel sounds present  Extremities: Swelling (2+ pedal edema/1+ pretibial bilaterally); covered in gauze, teds, booties for shower.  Psych: Pt's affect is appropriate. 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   RUE antecubital fossa with erythema and small palpable abscess; no active drainage.       Neuro: AAOx4. Mild dysarthria noted. Delayed processing. CN 2-12 intact. MSK: Moving all 4 limbs antigravity and against resistance. Standing in shower at SPV level        Assessment/Plan: 1. Functional deficits which require 3+ hours per day of interdisciplinary therapy in a comprehensive inpatient rehab setting. Physiatrist is providing close team supervision and 24 hour management of active medical problems listed below. Physiatrist and rehab team continue to assess barriers to discharge/monitor patient progress toward functional and medical goals  Care Tool:  Bathing    Body parts bathed by patient: Right arm, Left arm, Chest, Abdomen, Front perineal area, Buttocks, Right upper leg, Left upper leg, Face   Body parts bathed by helper: Right lower leg, Left lower leg     Bathing assist Assist Level: Minimal Assistance - Patient > 75%     Upper Body Dressing/Undressing Upper body dressing   What is the patient wearing?: Pull over shirt    Upper body assist Assist Level: Set up assist    Lower Body Dressing/Undressing Lower body dressing      What is the patient wearing?: Pants     Lower body assist Assist for lower body dressing: Moderate Assistance - Patient 50 - 74%     Toileting Toileting    Toileting assist Assist for toileting: Minimal Assistance - Patient > 75%     Transfers Chair/bed transfer  Transfers assist  Chair/bed transfer assist level: Minimal Assistance - Patient > 75%     Locomotion Ambulation   Ambulation assist      Assist level: Minimal Assistance - Patient > 75% Assistive device: Walker-rolling Max distance: 100'   Walk 10 feet activity   Assist     Assist level: Minimal Assistance - Patient > 75% Assistive device: Walker-rolling   Walk 50 feet activity   Assist    Assist level: Minimal Assistance - Patient > 75% Assistive device: Walker-rolling    Walk 150 feet activity   Assist Walk 150 feet activity did not occur: Safety/medical concerns         Walk 10 feet on uneven surface  activity   Assist     Assist level: Minimal Assistance - Patient > 75% Assistive  device: Walker-rolling   Wheelchair     Assist Is the patient using a wheelchair?: No             Wheelchair 50 feet with 2 turns activity    Assist            Wheelchair 150 feet activity     Assist          Blood pressure 128/64, pulse 64, temperature 98.1 F (36.7 C), resp. rate 17, height 5\' 8"  (1.727 m), weight 107 kg, SpO2 97 %.    Medical Problem List and Plan: 1. 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  Antithrombotics: -DVT/anticoagulation:  Mechanical: Sequential compression devices, below knee Bilateral lower extremities             -antiplatelet therapy: --off DAPT due to SDH 3. Pain Management:  tylenol and robaxin qid effective--not taking any prns  4. Mood/Behavior/Sleep: LCSW to follow for evaluation and support.              --trazodone prn for insomnia.              -antipsychotic agents: N/A 5. Neuropsych/cognition: This patient is capable of making decisions on his own behalf. 6. Chronic lymphedema/Skin/Wound Care: Wounds treated with Aquacell and compressive wraps per wound clinic-->continue.             - Increase wrap to 1 inch below knees for edema management            - Emla steroid cream to R elbow, gluteal psoriatic lesions. Notified nursing to remove sacral heart as note DTI. - reinforced 10/13            - RUE abscess at former IV line - start Doxy 100 mg BID for MRSA coverage x5 days  7. Fluids/Electrolytes/Nutrition: Monitor I/O. Check CMET in am.            - 10/13 - mild increase in     - 10/12 - Mild AKI, encouraging PO fluids, gentle IVF 75/hr today. Recheck in AM. Cr despite IVF; however, noted baseline Cr 1.7, in setting of ongoing peripheral edema and CHF will stop IVF and f/u labs Monday  8. Seizure prophylaxis: Completed 7 day course of Keppra this am.              -Repeat head CT in 2  weeks.  9. HTN:  Monitor BP TID--trending up and cozaar 12.5mg  added on 10/10 10. T2DM: Hgb A1c- 6.4 and well controlled on home regimen --will monitor BS ac/hs -->CBG running 110-130 at this time on SSI             --70/30 insulin resumed on 10/10. Will start at 5 units and titrated to home 16 units as indicated. Resume trulicity--ask family to bring from home if unavailable.  -- Monitor intake. Change Ensure to Ensure max and protein supplements. - 10/11 well controlled; continue Recent Labs    11/06/21 2103 11/07/21 0529 11/07/21 0615  GLUCAP 150* 102* 124*       11. CKD: Baseline SCr @ 1.7. (Hyperkalemia with higher dose of ARBs.) --Improved to 1.29 with gentle hydration 10/03-10/07.  - 10/11 Cr 1.59 on intake; encourage PO fluids today, recheck in AM             - 10/12 1 L IVF for Cr. 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              --BP goal 130-150 range for adequate cerebral perfusion.     11/07/2021    9:58 AM 11/07/2021    3:52 AM 11/06/2021    7:52 PM  Vitals with BMI  Weight 235 lbs 14 oz    BMI 35.88    Systolic  128 126  Diastolic  64 76  Pulse  64 70     13. Chronic systolic CHF/Abnormal stress test: Daily wts. Monitor for signs of overload.  --Cardiac cath 10/13 (was delayed due to renal issues) --On Lipitor, Zebeta, Lasix, Cozaar-->all resumed on 10/10 --Orthostatic vitals and monitor for symptoms with increase in activity.  Filed Weights   11/04/21 1546 11/04/21 1652 11/07/21 0958  Weight: 110.8 kg 110.5 kg 107 kg              - 10/12 - daily weights. Started gentle IVF 75 cc/hr today for ongoing AKI, continue lasix for LE edema.               -10/13 - Weights down 3 kg despite IVF; likely inaccurate  13. H/o Hyponatremia: Due to poor  intake/HCTZ per 08/17 admission.              -Na has been WNL with last Na of 137 on 10/7             - NA 141 on admission; monitor  14. ABLA: Recheck CBC in am. Last HGB 11.6 15. Dizziness: Vestibular evaluation.  16. Constipation: Has not had BM for 5 days. Will give higher dose miralax today and monitor.            - 10/11 - no BM with miralax yesterday. Increased bowel regimen to daily miralax + Sennakot S 1 tab BID. Added PRN suppository if no BM by tonight .            - LBM 10/12,2x, one large  LOS: 3 days A FACE TO FACE EVALUATION WAS PERFORMED  Angelina Sheriff 11/07/2021, 11:43 AM

## 2021-11-07 NOTE — Plan of Care (Signed)
Behavioral Plan   [place] Level: IX  Behavior to decrease/ eliminate:   No unsafe behaviors observed   Changes to environment:   -Lights on, blinds open during the day; off and closed at night -Sit up in the recliner during day -Prefers to change into a gown at night prior to bed  Interventions:  -bed alarm -chair alarm -Use RW when ambulating  Recommendations for interactions with patient:  -Patient is hard of hearing and may need multiple repetitions of information   Attendees:    Weston Anna, SLP Cherylynn Ridges, OT Tereasa Coop, PT Tacy Learn, RN

## 2021-11-08 LAB — GLUCOSE, CAPILLARY
Glucose-Capillary: 119 mg/dL — ABNORMAL HIGH (ref 70–99)
Glucose-Capillary: 115 mg/dL — ABNORMAL HIGH (ref 70–99)
Glucose-Capillary: 113 mg/dL — ABNORMAL HIGH (ref 70–99)
Glucose-Capillary: 141 mg/dL — ABNORMAL HIGH (ref 70–99)

## 2021-11-08 MED ORDER — MEDIHONEY WOUND/BURN DRESSING EX PSTE
1.0000 | PASTE | Freq: Every day | CUTANEOUS | Status: DC
  Administered 2021-11-08 – 2021-11-14 (×7): 1 via TOPICAL
  Filled 2021-11-08: qty 44

## 2021-11-08 NOTE — Progress Notes (Signed)
Occupational Therapy Session Note  Patient Details  Name: James Dougherty MRN: [id number] Date of Birth: 1951-01-01  Today's Date: 11/08/2021 OT Individual Time: 1100-1200 OT Individual Time Calculation (min): 60 min    Short Term Goals: Week 1:  OT Short Term Goal 1 (Week 1): STG=LTG d/t ELOS  Skilled Therapeutic Interventions/Progress Updates:    Pt received in recliner expressing desire to shave, shower and use the bathroom. Consulted with RN and recommendation was to remove leg wraps to wash skin with soap and water.  Pt ambulated with RW with light CGA to close S to sink and stood with S as he completed shaving with electric razor, then ambulated to toilet. Completed toileting S, transferred to shower bench. Doffed leg wraps and covered IV site.  Pt showered with S demonstrating improved safety awareness using caution with his sit to stands and improved balance.  After drying, donned socks to ambulate to recliner to dress.  After dressing with S, RN arrived to wrap legs.  (Also applied antifungal powder under chest and abdominal folds as pt stated he was developing redness.) Pt in recliner with belt alarm on and all needs met.  Therapy Documentation Precautions:  Precautions Precautions: Fall Precaution Comments: x2 recent falls Restrictions Weight Bearing Restrictions: No  Pain:   ADL: ADL Eating: Set up Where Assessed-Eating: Edge of bed Grooming: Minimal assistance Where Assessed-Grooming: Standing at sink Upper Body Bathing: Supervision/safety Where Assessed-Upper Body Bathing: Shower Lower Body Bathing: Minimal assistance Where Assessed-Lower Body Bathing: Shower Upper Body Dressing: Supervision/safety Where Assessed-Upper Body Dressing: Edge of bed Lower Body Dressing: Minimal assistance Where Assessed-Lower Body Dressing: Edge of bed Toileting: Minimal assistance Where Assessed-Toileting: Bedside Commode Toilet Transfer: Minimal assistance Toilet Transfer  Method: Counselling psychologist: Geophysical data processor: Minimal assistance Social research officer, government Method: Heritage manager: Transfer tub bench   Therapy/Group: Individual Therapy  [place] 11/08/2021, 9:33 AM

## 2021-11-08 NOTE — Progress Notes (Signed)
Physical Therapy Session Note  Patient Details  Name: James Dougherty MRN: [id number] Date of Birth: Nov 25, 1950  Today's Date: 11/08/2021 PT Individual Time: 9509-3267 PT Individual Time Calculation (min): 75 min   Short Term Goals: Week 1:  PT Short Term Goal 1 (Week 1): STG=LTG secondary to ELOS   Skilled Therapeutic Interventions/Progress Updates:  Patient seated upright in recliner on entrance to room. Patient alert and agreeable to PT session.   Patient with no pain complaint at start of session.  Therapeutic Activity: Transfers: Pt performed sit<>stand and stand pivot transfers throughout session with supervision. Provided verbal cues for forward lean and attaining balance and use of toes to maintain balance.  Pt guided in continuous reciprocation of BUE and BLE using NuStep L2 x 62min/ L3 x 4 min/ L2 x86min with focus on maintaining pace at 48-50 steps/ min. Upon rest, pt's breathing rate at 24 breaths/ min. In one minute at rest, pt's breathing rate improves to 20 breaths/ min.   Gait Training:  Pt ambulated 160' x2 using RW with close supervision. Demonstrated good awareness of need to increase Bil step length and initiates practice to reduce shuffling. Provided vc/ tc for maintaining throughout.  Neuromuscular Re-ed: NMR facilitated during session with focus on standing balance. Pt guided in stance on Airex pad using RW to steady self and then releasing for no UE support. Pt demos intermittent posterior LOB. Progresses to picking up rubber basketball from mid-shin height platform with both hands and then reach to head ehight and then to overhead height to touch ball to target. Pt then guided in using 1# weighted ball o perform "pot-stirrers". 2x10 throughout. NMR performed for improvements in motor control and coordination, balance, sequencing, judgement, and self confidence/ efficacy in performing all aspects of mobility at highest level of independence.   Patient seated upright  in recliner at end of session with brakes locked, belt alarm set, and all needs within reach.   Therapy Documentation Precautions:  Precautions Precautions: Fall Precaution Comments: x2 recent falls Restrictions Weight Bearing Restrictions: No General:   Vital Signs:  Pain:   Mobility:   Therapy/Group: Individual Therapy  Alger Simons 11/08/2021, 9:13 AM

## 2021-11-08 NOTE — Progress Notes (Signed)
PROGRESS NOTE   Subjective/Complaints: Shows me wound on right forearm, discussed applying manuka honey and he is agreeable Showering with OT's assistance Edema garments are only being applied to calf  ROS: Denies fevers, chills, N/V, abdominal pain, constipation, diarrhea, SOB, cough, chest pain, new weakness or paraesthesias. +right forearm wound  Objective:   No results found. No results for input(s): "WBC", "HGB", "HCT", "PLT" in the last 72 hours. Recent Labs    11/06/21 0545 11/07/21 0609  NA 142 139  K 4.4 4.4  CL 101 104  CO2 28 25  GLUCOSE 113* 120*  BUN 20 23  CREATININE 1.44* 1.48*  CALCIUM 9.6 9.2    Intake/Output Summary (Last 24 hours) at 11/08/2021 1353 Last data filed at 11/08/2021 0753 Gross per 24 hour  Intake 297 ml  Output --  Net 297 ml     Pressure Injury 11/04/21 Coccyx Mid Deep Tissue Pressure Injury - Purple or maroon localized area of discolored intact skin or blood-filled blister due to damage of underlying soft tissue from pressure and/or shear. non-blanchable purple area surrounded  (Active)  11/04/21 1634  Location: Coccyx  Location Orientation: Mid  Staging: Deep Tissue Pressure Injury - Purple or maroon localized area of discolored intact skin or blood-filled blister due to damage of underlying soft tissue from pressure and/or shear.  Wound Description (Comments): non-blanchable purple area surrounded by psoriatic redness  Present on Admission:     Physical Exam: Vital Signs Blood pressure (!) 138/53, pulse 75, temperature 97.8 F (36.6 C), resp. rate 18, height 5\' 8"  (1.727 m), weight 105.8 kg, SpO2 99 %. Gen: no distress, normal appearing, showering with OT assistance HEENT: +glasses, +HOH Neck: Supple without JVD or lymphadenopathy Heart: Reg rate and rhythm. No murmurs rubs or gallops Chest: CTA bilaterally without wheezes, rales, or rhonchi; no distress Abdomen: Soft,  non-tender, nondistended, bowel sounds present  Extremities: Swelling (2+ pedal edema/1+ pretibial bilaterally); covered in gauze, teds, booties for shower.  Psych: Pt's affect is appropriate. 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   RUE antecubital fossa with erythema and small palpable abscess; no active drainage.       Neuro: AAOx4. Mild dysarthria noted. Delayed processing. CN 2-12 intact. MSK: Moving all 4 limbs antigravity and against resistance. Standing in shower at SPV level       Assessment/Plan: 1. Functional deficits which require 3+ hours per day of interdisciplinary therapy in a comprehensive inpatient rehab setting. Physiatrist is providing close team supervision and 24 hour management of active medical problems listed below. Physiatrist and rehab team continue to assess barriers to discharge/monitor patient progress toward functional and medical goals  Care Tool:  Bathing    Body parts bathed by patient: Right arm, Face, Left arm, Chest, Abdomen, Front perineal area, Buttocks, Right upper leg, Left upper leg   Body parts bathed by helper: Right lower leg, Left lower leg Body parts n/a: Right lower leg, Left lower leg   Bathing assist Assist Level: Contact Guard/Touching assist     Upper Body Dressing/Undressing Upper body dressing   What is  the patient wearing?: Pull over shirt    Upper body assist Assist Level: Set up assist    Lower Body Dressing/Undressing Lower body dressing      What is the patient wearing?: Pants     Lower body assist Assist for lower body dressing: Contact Guard/Touching assist     Toileting Toileting    Toileting assist Assist for toileting: Moderate Assistance - Patient 50 - 74%     Transfers Chair/bed transfer  Transfers assist     Chair/bed transfer assist level: Contact Guard/Touching assist      Locomotion Ambulation   Ambulation assist      Assist level: Minimal Assistance - Patient > 75% Assistive device: Walker-rolling Max distance: 100'   Walk 10 feet activity   Assist     Assist level: Minimal Assistance - Patient > 75% Assistive device: Walker-rolling   Walk 50 feet activity   Assist    Assist level: Minimal Assistance - Patient > 75% Assistive device: Walker-rolling    Walk 150 feet activity   Assist Walk 150 feet activity did not occur: Safety/medical concerns         Walk 10 feet on uneven surface  activity   Assist     Assist level: Minimal Assistance - Patient > 75% Assistive device: Walker-rolling   Wheelchair     Assist Is the patient using a wheelchair?: No             Wheelchair 50 feet with 2 turns activity    Assist            Wheelchair 150 feet activity     Assist          Blood pressure (!) 138/53, pulse 75, temperature 97.8 F (36.6 C), resp. rate 18, height 5\' 8"  (1.727 m), weight 105.8 kg, SpO2 99 %.    Medical Problem List and Plan: 1. 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  Antithrombotics: -DVT/anticoagulation:  Mechanical: Sequential compression devices, below knee Bilateral lower extremities             -antiplatelet therapy: --off DAPT due to SDH 3. Pain Management:  tylenol and robaxin qid effective--not taking any prns  4. Mood/Behavior/Sleep: LCSW to follow for evaluation and support.              --trazodone prn for insomnia.              -antipsychotic agents: N/A 5. Neuropsych/cognition: This patient is capable of making decisions on his own behalf. 6. Chronic lymphedema/Skin/Wound Care: Wounds treated with Aquacell and compressive wraps per wound clinic-->continue.             - Increase wrap to 1 inch below knees for edema management             - Emla steroid cream to R elbow, gluteal psoriatic lesions. Notified nursing to remove sacral heart as note DTI. - reinforced 10/13            - RUE abscess at former IV line - start Doxy 100 mg BID for MRSA coverage x5 days. Apply manuka honey daily.  7. Fluids/Electrolytes/Nutrition: Monitor I/O. Check CMET in am.            -  10/13 - mild increase in     - 10/12 - Mild AKI, encouraging PO fluids, gentle IVF 75/hr today. Recheck in AM. Cr despite IVF; however, noted baseline Cr 1.7, in setting of ongoing peripheral edema and CHF will stop IVF and f/u labs Monday  8. Seizure prophylaxis: Completed 7 day course of Keppra this am.              -Repeat head CT in 2 weeks.  9. HTN: Monitor BP TID--trending up and cozaar 12.5mg  added on 10/10, continue 10. T2DM: Hgb A1c- 6.4 and well controlled on home regimen --will monitor BS ac/hs -->CBG running 110-130 at this time on SSI             --70/30 insulin resumed on 10/10. Will start at 5 units and titrated to home 16 units as indicated. Resume trulicity--ask family to bring from home if unavailable.  -- Monitor intake. Change Ensure to Ensure max and protein supplements. - 10/11 well controlled; continue to monitor CBGs Recent Labs    11/07/21 2124 11/08/21 0610 11/08/21 1128  GLUCAP 134* 119* 115*      11. CKD: Baseline SCr @ 1.7. (Hyperkalemia with higher dose of ARBs.) --Improved to 1.29 with gentle hydration 10/03-10/07.  - 10/11 Cr 1.59 on intake; encourage PO fluids today, recheck in AM             - 10/12 1 L IVF for Cr. 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              --BP goal 130-150 range for adequate cerebral perfusion.     11/08/2021    1:00 PM 11/08/2021    5:00 AM 11/08/2021    3:25 AM  Vitals with BMI   Weight  233 lbs 4 oz   BMI  08.65   Systolic 784  696  Diastolic 53  32  Pulse 75  69     13. Chronic systolic CHF/Abnormal stress test: Daily wts. Monitor for signs of overload.  --Cardiac cath 10/13 (was delayed due to renal issues) - Continue Lipitor, Zebeta, Lasix, Cozaar-->all resumed on 10/10 --Orthostatic vitals and monitor for symptoms with increase in activity.  Filed Weights   11/04/21 1652 11/07/21 0958 11/08/21 0500  Weight: 110.5 kg 107 kg 105.8 kg              - 10/12 - daily weights. Started gentle IVF 75 cc/hr today for ongoing AKI, continue lasix for LE edema.               -10/13 - Weights down 3 kg despite IVF; likely inaccurate  10/14: weights continue to be down  13. H/o Hyponatremia: Due to poor intake/HCTZ per 08/17 admission.              -Na has been WNL with last Na of 137 on 10/7             - NA 141 on admission; monitor  14. ABLA: Recheck CBC in am. Last HGB 11.6 15. Dizziness: Vestibular evaluation.  16. Constipation: Has not had BM for 5 days. Will give higher dose miralax today and monitor.            -  10/11 - no BM with miralax yesterday. Increased bowel regimen to daily miralax + Sennakot S 1 tab BID. Added PRN suppository if no BM by tonight .            - LBM 10/12,2x, one large  LOS: 4 days A FACE TO FACE EVALUATION WAS PERFORMED  Kieryn Burtis P Darleene Cumpian 11/08/2021, 1:53 PM

## 2021-11-08 NOTE — Progress Notes (Signed)
Occupational Therapy Session Note  Patient Details  Name: James Dougherty MRN: [id number] Date of Birth: 1950/07/18  Today's Date: 11/08/2021 OT Individual Time: 0200-0300 OT Individual Time Calculation (min): 60 min    Short Term Goals: Week 1:  OT Short Term Goal 1 (Week 1): STG=LTG d/t ELOS  Skilled Therapeutic Interventions/Progress Updates: patient participated in multiple high level functional mobility, Nu Step 7 minutes level 3 and 3 minute level 2),and standing endurance training activities to address balance, independence and endurance for self care and IADLs.  He tolerated all activities well.   He can benefit on demonstrating carryover of pushing up from sitting surface rather than pulling onto walker, table or other piece of equipment or furniture  Continue OT POC.     Therapy Documentation Precautions:  Precautions Precautions: Fall Precaution Comments: x2 recent falls Restrictions Weight Bearing Restrictions: No   Pain:denied   Therapy/Group: Individual Therapy  Alfredia Ferguson [facility] 11/08/2021, 4:54 PM

## 2021-11-09 LAB — GLUCOSE, CAPILLARY
Glucose-Capillary: 137 mg/dL — ABNORMAL HIGH (ref 70–99)
Glucose-Capillary: 103 mg/dL — ABNORMAL HIGH (ref 70–99)
Glucose-Capillary: 117 mg/dL — ABNORMAL HIGH (ref 70–99)
Glucose-Capillary: 147 mg/dL — ABNORMAL HIGH (ref 70–99)

## 2021-11-09 MED ORDER — PNEUMOCOCCAL 20-VAL CONJ VACC 0.5 ML IM SUSY
0.5000 mL | PREFILLED_SYRINGE | INTRAMUSCULAR | Status: DC
  Filled 2021-11-09: qty 0.5

## 2021-11-09 NOTE — Progress Notes (Signed)
PROGRESS NOTE   Subjective/Complaints: Seen ambulating well in hallway this morning! Patient's chart reviewed- No issues reported overnight Vitals signs stable except for soft BP  ROS: Denies fevers, chills, N/V, abdominal pain, constipation, diarrhea, SOB, cough, chest pain, new weakness or paraesthesias. +right forearm wound  Objective:   No results found. No results for input(s): "WBC", "HGB", "HCT", "PLT" in the last 72 hours. Recent Labs    11/07/21 0609  NA 139  K 4.4  CL 104  CO2 25  GLUCOSE 120*  BUN 23  CREATININE 1.48*  CALCIUM 9.2    Intake/Output Summary (Last 24 hours) at 11/09/2021 0906 Last data filed at 11/09/2021 0745 Gross per 24 hour  Intake 656 ml  Output 500 ml  Net 156 ml     Pressure Injury 11/04/21 Coccyx Mid Deep Tissue Pressure Injury - Purple or maroon localized area of discolored intact skin or blood-filled blister due to damage of underlying soft tissue from pressure and/or shear. non-blanchable purple area surrounded  (Active)  11/04/21 1634  Location: Coccyx  Location Orientation: Mid  Staging: Deep Tissue Pressure Injury - Purple or maroon localized area of discolored intact skin or blood-filled blister due to damage of underlying soft tissue from pressure and/or shear.  Wound Description (Comments): non-blanchable purple area surrounded by psoriatic redness  Present on Admission:     Physical Exam: Vital Signs Blood pressure (!) 102/52, pulse 69, temperature 98.7 F (37.1 C), temperature source Oral, resp. rate 18, height 5\' 8"  (1.727 m), weight 105.6 kg, SpO2 97 %. Gen: no distress, normal appearing, ambulating with therapy assistance with RW HEENT: +glasses, +HOH Neck: Supple without JVD or lymphadenopathy Heart: Reg rate and rhythm. No murmurs rubs or gallops Chest: CTA bilaterally without wheezes, rales, or rhonchi; no distress Abdomen: Soft, non-tender, nondistended,  bowel sounds present  Extremities: Swelling (2+ pedal edema/1+ pretibial bilaterally); covered in gauze, teds, booties for shower.  Psych: Pt's affect is appropriate. 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   RUE antecubital fossa with erythema and small palpable abscess; no active drainage.       Neuro: AAOx4. Mild dysarthria noted. Delayed processing. CN 2-12 intact. MSK: Moving all 4 limbs antigravity and against resistance. Standing in shower at SPV level       Assessment/Plan: 1. Functional deficits which require 3+ hours per day of interdisciplinary therapy in a comprehensive inpatient rehab setting. Physiatrist is providing close team supervision and 24 hour management of active medical problems listed below. Physiatrist and rehab team continue to assess barriers to discharge/monitor patient progress toward functional and medical goals  Care Tool:  Bathing    Body parts bathed by patient: Right arm, Face, Left arm, Chest, Abdomen, Front perineal area, Buttocks, Right upper leg, Left upper leg   Body parts bathed by helper: Right lower leg, Left lower leg Body parts n/a: Right lower leg, Left lower leg   Bathing assist Assist Level: Contact Guard/Touching assist     Upper Body Dressing/Undressing Upper body dressing   What is the patient wearing?: Pull over shirt  Upper body assist Assist Level: Supervision/Verbal cueing    Lower Body Dressing/Undressing Lower body dressing      What is the patient wearing?: Pants     Lower body assist Assist for lower body dressing: Supervision/Verbal cueing     Toileting Toileting    Toileting assist Assist for toileting: Moderate Assistance - Patient 50 - 74%     Transfers Chair/bed transfer  Transfers assist     Chair/bed transfer assist level: Contact Guard/Touching assist      Locomotion Ambulation   Ambulation assist      Assist level: Minimal Assistance - Patient > 75% Assistive device: Walker-rolling Max distance: 100'   Walk 10 feet activity   Assist     Assist level: Minimal Assistance - Patient > 75% Assistive device: Walker-rolling   Walk 50 feet activity   Assist    Assist level: Minimal Assistance - Patient > 75% Assistive device: Walker-rolling    Walk 150 feet activity   Assist Walk 150 feet activity did not occur: Safety/medical concerns         Walk 10 feet on uneven surface  activity   Assist     Assist level: Minimal Assistance - Patient > 75% Assistive device: Walker-rolling   Wheelchair     Assist Is the patient using a wheelchair?: No             Wheelchair 50 feet with 2 turns activity    Assist            Wheelchair 150 feet activity     Assist          Blood pressure (!) 102/52, pulse 69, temperature 98.7 F (37.1 C), temperature source Oral, resp. rate 18, height 5\' 8"  (1.727 m), weight 105.6 kg, SpO2 97 %.    Medical Problem List and Plan: 1. 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  Antithrombotics: -DVT/anticoagulation:  Mechanical: Sequential compression devices, below knee Bilateral lower extremities             -antiplatelet therapy: --off DAPT due to SDH 3. Pain Management:  tylenol and robaxin qid effective--not taking any prns  4. Mood/Behavior/Sleep: LCSW to follow for evaluation and support.              --trazodone prn for insomnia.              -antipsychotic agents: N/A 5. Neuropsych/cognition: This patient is capable of making decisions on his own behalf. 6. Chronic lymphedema/Skin/Wound Care: Wounds treated with Aquacell and compressive wraps per wound clinic-->continue.             - Increase wrap to 1 inch below knees for  edema management            - Emla steroid cream to R elbow, gluteal psoriatic lesions. Notified nursing to remove sacral heart as note DTI. - reinforced 10/13            - RUE abscess at former IV line - start Doxy 100 mg BID for MRSA coverage x5 days. Apply manuka honey daily.  7. Fluids/Electrolytes/Nutrition: Monitor I/O. Check CMET in am.            - 10/13 -  mild increase in     - 10/12 - Mild AKI, encouraging PO fluids, gentle IVF 75/hr today. Recheck in AM. Cr despite IVF; however, noted baseline Cr 1.7, in setting of ongoing peripheral edema and CHF will stop IVF and f/u labs Monday  8. Seizure prophylaxis: Completed 7 day course of Keppra this am.              -Repeat head CT in 2 weeks.  9. HTN: Monitor BP TID--trending up and cozaar 12.5mg  added on 10/10, continue  10/15: BP is soft today, flowsheet reviewed and it fluctuates, continue coreg 10. T2DM: Hgb A1c- 6.4 and well controlled on home regimen --will monitor BS ac/hs -->CBG running 110-130 at this time on SSI             --70/30 insulin resumed on 10/10. Will start at 5 units and titrated to home 16 units as indicated. Resume trulicity--ask family to bring from home if unavailable.  -- Monitor intake. Change Ensure to Ensure max and protein supplements. - 10/15 well controlled; continue to monitor CBGs and continue above regimen Recent Labs    11/08/21 1631 11/08/21 2105 11/09/21 0602  GLUCAP 113* 141* 137*      11. CKD: Baseline SCr @ 1.7. (Hyperkalemia with higher dose of ARBs.) --Improved to 1.29 with gentle hydration 10/03-10/07.  - 10/11 Cr 1.59 on intake; encourage PO fluids today, recheck in AM             - 10/12 1 L IVF for Cr. 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              --BP goal 130-150  range for adequate cerebral perfusion.     11/09/2021    5:01 AM 11/09/2021    3:54 AM 11/08/2021    8:31 PM  Vitals with BMI  Weight 232 lbs 13 oz    BMI 35.41    Systolic  102 137  Diastolic  52 68  Pulse  69 85     13. Chronic systolic CHF/Abnormal stress test: Daily wts. Monitor for signs of overload.  --Cardiac cath 10/13 (was delayed due to renal issues) - Continue Lipitor, Zebeta, Lasix, Cozaar-->all resumed on 10/10 --Orthostatic vitals and monitor for symptoms with increase in activity.  Filed Weights   11/07/21 0958 11/08/21 0500 11/09/21 0501  Weight: 107 kg 105.8 kg 105.6 kg              - 10/12 - daily weights. Started gentle IVF 75 cc/hr today for ongoing AKI, continue lasix for LE edema.               -10/13 - Weights down 3 kg despite IVF; likely inaccurate  10/14-15: weights continue to be down/stable  13. H/o Hyponatremia: Due to poor intake/HCTZ per 08/17 admission.              -Na has been WNL with last Na of 137 on 10/7             - NA 141 on admission; monitor  14. ABLA: Recheck CBC in am. Last HGB 11.6 15. Dizziness: Vestibular evaluation.  16. Constipation: Has not had BM for 5 days. Will  give higher dose miralax today and monitor.            - 10/11 - no BM with miralax yesterday. Increased bowel regimen to daily miralax + Sennakot S 1 tab BID. Added PRN suppository if no BM by tonight .            - LBM 10/12,2x, one large  LOS: 5 days A FACE TO FACE EVALUATION WAS PERFORMED  Monia Timmers P Mandrell Vangilder 11/09/2021, 9:06 AM

## 2021-11-09 NOTE — Progress Notes (Signed)
Occupational Therapy Session Note  Patient Details  Name: James Dougherty MRN: [id number] Date of Birth: 1950-02-03  Today's Date: 11/09/2021 OT Individual Time: 9371-6967 OT Individual Time Calculation (min): 60 min    Short Term Goals: Week 1:  OT Short Term Goal 1 (Week 1): STG=LTG d/t ELOS  Skilled Therapeutic Interventions/Progress Updates:    Upon OT arrival, pt seated in recliner reporting no pain and requests to stay in gown. Educated pt on role of OT and pt agreeable to get dressed. Pt completes sit to stand transfer with Supervision using RW and ambulates to dresser to retrieve clothes with Supervision. Pt stands to [employer] and donn shirt with Supervision. Pt ambulates to recliner and performs stand to sit transfer with Supervision. Pt threads shorts while seated and stands to pull up over hips completing with Supervision. Pt amblulates with RW and Supervision to the dayroom and completes stand to sit transfer with Supervision onto NUSTEP. Pt completes NUSTEP on level 9 for 15 minutes with two rest breaks. Pt completes sit to stand transfer with Supervision using RW and ambulates with Supervision infront of cornhole board to engage in bean bag toss activity reaching from bean bags in all planes with 1 UE and tossing onto board. Pt completes 2 x ~16 reps and retrieves bean bags from the floor using a reacher and standing with the RW to return to the basket with Supervision between sets. Pt states "That was more of a workout than the machine". Pt ambulates to his room with RW and Supervision and doffs clothes with supervision secondary to pt requesting to get back in a gown at end of session. Pt returns to seated position in recliner with Supervision and was left in recliner at end of session with all needs met and safety measures in place.   Therapy Documentation Precautions:  Precautions Precautions: Fall Precaution Comments: x2 recent falls Restrictions Weight Bearing Restrictions:  No   Therapy/Group: Individual Therapy  [name] 11/09/2021, 7:53 AM

## 2021-11-10 ENCOUNTER — Ambulatory Visit: Payer: Medicare Other | Admitting: Cardiology

## 2021-11-10 DIAGNOSIS — S065XAA Traumatic subdural hemorrhage with loss of consciousness status unknown, initial encounter: Secondary | ICD-10-CM

## 2021-11-10 LAB — GLUCOSE, CAPILLARY
Glucose-Capillary: 111 mg/dL — ABNORMAL HIGH (ref 70–99)
Glucose-Capillary: 88 mg/dL (ref 70–99)
Glucose-Capillary: 144 mg/dL — ABNORMAL HIGH (ref 70–99)
Glucose-Capillary: 142 mg/dL — ABNORMAL HIGH (ref 70–99)

## 2021-11-10 LAB — BASIC METABOLIC PANEL
Anion gap: 9 (ref 5–15)
BUN: 21 mg/dL (ref 8–23)
CO2: 27 mmol/L (ref 22–32)
Calcium: 9.3 mg/dL (ref 8.9–10.3)
Chloride: 106 mmol/L (ref 98–111)
Creatinine, Ser: 1.46 mg/dL — ABNORMAL HIGH (ref 0.61–1.24)
GFR, Estimated: 51 mL/min — ABNORMAL LOW (ref >60)
Glucose, Bld: 111 mg/dL — ABNORMAL HIGH (ref 70–99)
Potassium: 4.3 mmol/L (ref 3.5–5.1)
Sodium: 142 mmol/L (ref 135–145)

## 2021-11-10 LAB — CBC
HCT: 40.3 % (ref 39.0–52.0)
Hemoglobin: 13.5 g/dL (ref 13.0–17.0)
MCH: 32.1 pg (ref 26.0–34.0)
MCHC: 33.5 g/dL (ref 30.0–36.0)
MCV: 96 fL (ref 80.0–100.0)
Platelets: 288 10*3/uL (ref 150–400)
RBC: 4.2 MIL/uL — ABNORMAL LOW (ref 4.22–5.81)
RDW: 15 % (ref 11.5–15.5)
WBC: 6.3 10*3/uL (ref 4.0–10.5)
nRBC: 0 % (ref 0.0–0.2)

## 2021-11-10 NOTE — Progress Notes (Signed)
PROGRESS NOTE   Subjective/Complaints: No acute complaints. No events ovenright. Noted open areas on R 2nd toe, nonpainful. Pt enodrses he has a TED script but cannot tolerate or don/doff himself.   ROS: Denies fevers, chills, N/V, abdominal pain, constipation, diarrhea, SOB, cough, chest pain, new weakness or paraesthesias.    Objective:   No results found. Recent Labs    11/10/21 0536  WBC 6.3  HGB 13.5  HCT 40.3  PLT 288   Recent Labs    11/10/21 0536  NA 142  K 4.3  CL 106  CO2 27  GLUCOSE 111*  BUN 21  CREATININE 1.46*  CALCIUM 9.3     Intake/Output Summary (Last 24 hours) at 11/10/2021 0854 Last data filed at 11/10/2021 0543 Gross per 24 hour  Intake 573 ml  Output 2200 ml  Net -1627 ml      Pressure Injury 11/04/21 Coccyx Mid Deep Tissue Pressure Injury - Purple or maroon localized area of discolored intact skin or blood-filled blister due to damage of underlying soft tissue from pressure and/or shear. non-blanchable purple area surrounded  (Active)  11/04/21 1634  Location: Coccyx  Location Orientation: Mid  Staging: Deep Tissue Pressure Injury - Purple or maroon localized area of discolored intact skin or blood-filled blister due to damage of underlying soft tissue from pressure and/or shear.  Wound Description (Comments): non-blanchable purple area surrounded by psoriatic redness  Present on Admission:     Physical Exam: Vital Signs Blood pressure 133/60, pulse 66, temperature 97.8 F (36.6 C), temperature source Oral, resp. rate 18, height 5\' 8"  (1.727 m), weight 105.6 kg, SpO2 95 %. Gen: no distress, normal appearing, ambulating with therapy assistance with RW HEENT: +glasses, +HOH Neck: Supple without JVD or lymphadenopathy Heart: Reg rate and rhythm. No murmurs rubs or gallops Chest: CTA bilaterally without wheezes, rales, or rhonchi; no distress Abdomen: Soft, non-tender, nondistended,  bowel sounds present  Extremities: Swelling (1+ pedal edema/0+ pretibial bilaterally). 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  Psych: Pt's affect is appropriate. Pt is cooperative Skin: dry skin in b/l, psoriatic lesions on b/l elbows and sacrum, sacral areas  RUE antecubital fossa with erythema and small palpable abscess - much improved R 2nd toe - open ares with clean base, no drainage as pictured below      Neuro: AAOx4. Mild dysarthria noted.  CN 2-12 intact. MSK: Moving all 4 limbs antigravity and against resistance.         Assessment/Plan: 1. Functional deficits which require 3+ hours per day of interdisciplinary therapy in a comprehensive inpatient rehab setting. Physiatrist is providing close team supervision and 24 hour management of active medical problems listed below. Physiatrist and rehab team continue to assess barriers to discharge/monitor patient progress toward functional and medical goals  Care Tool:  Bathing    Body parts bathed by patient: Right arm, Right upper leg, Left upper leg, Left arm, Chest, Abdomen, Front perineal area, Left lower leg, Right lower leg, Buttocks   Body parts bathed by helper: Right lower leg, Left lower leg Body parts n/a: Right lower leg, Left lower leg   Bathing assist Assist Level: Supervision/Verbal cueing     Upper Body  Dressing/Undressing Upper body dressing   What is the patient wearing?: Pull over shirt    Upper body assist Assist Level: Set up assist    Lower Body Dressing/Undressing Lower body dressing      What is the patient wearing?: Pants     Lower body assist Assist for lower body dressing: Supervision/Verbal cueing     Toileting Toileting    Toileting assist Assist for toileting: Moderate Assistance - Patient 50 - 74%     Transfers Chair/bed transfer  Transfers assist     Chair/bed transfer assist level: Supervision/Verbal cueing     Locomotion Ambulation   Ambulation assist      Assist level:  Minimal Assistance - Patient > 75% Assistive device: Walker-rolling Max distance: 100'   Walk 10 feet activity   Assist     Assist level: Minimal Assistance - Patient > 75% Assistive device: Walker-rolling   Walk 50 feet activity   Assist    Assist level: Minimal Assistance - Patient > 75% Assistive device: Walker-rolling    Walk 150 feet activity   Assist Walk 150 feet activity did not occur: Safety/medical concerns         Walk 10 feet on uneven surface  activity   Assist     Assist level: Minimal Assistance - Patient > 75% Assistive device: Walker-rolling   Wheelchair     Assist Is the patient using a wheelchair?: No             Wheelchair 50 feet with 2 turns activity    Assist            Wheelchair 150 feet activity     Assist          Blood pressure 133/60, pulse 66, temperature 97.8 F (36.6 C), temperature source Oral, resp. rate 18, height 5\' 8"  (1.727 m), weight 105.6 kg, SpO2 95 %.    Medical Problem List and Plan: 1. 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  Antithrombotics: -DVT/anticoagulation:  Mechanical: Sequential compression devices, below knee Bilateral lower extremities             -antiplatelet therapy: --off DAPT due to SDH 3. Pain Management:  tylenol and robaxin qid effective--not taking any prns  4. Mood/Behavior/Sleep: LCSW to follow for evaluation and support.              --trazodone prn for insomnia.              -antipsychotic agents: N/A 5. Neuropsych/cognition: This patient is capable of making decisions on his own behalf. 6. Chronic lymphedema/Skin/Wound Care: Wounds treated with Aquacell and compressive wraps per wound clinic-->continue.             - Increase wrap to 1 inch below knees for edema management            - Emla steroid cream to R elbow, gluteal  psoriatic lesions. Notified nursing to remove sacral heart as note DTI. - reinforced 10/13            - RUE abscess at former IV line - start Doxy 100 mg BID for MRSA coverage x5 days. Apply manuka honey daily. - improved           -  RLE 2nd toe - wound care per nursing, cover in gauze and continue using ACE wrap for edema   7. Fluids/Electrolytes/Nutrition: Monitor I/O. Check CMET in am.            - 10/13 - mild increase in  Cr, encourage PO fluids         - 10/12 - Mild AKI, encouraging PO fluids, gentle IVF 75/hr today. Recheck in AM. Cr despite IVF; however, noted baseline Cr 1.7, in setting of ongoing peripheral edema and CHF will stop IVF and f/u labs Monday        - 10/16- Cr stable 1.4; monitor  8. Seizure prophylaxis: Completed 7 day course of Keppra this am.              -Repeat head CT in 2 weeks.  9. HTN: Monitor BP TID--trending up and cozaar 12.5mg  added on 10/10, continue  10/15: BP is soft today, flowsheet reviewed and it fluctuates, continue coreg 10. T2DM: Hgb A1c- 6.4 and well controlled on home regimen --will monitor BS ac/hs -->CBG running 110-130 at this time on SSI             --70/30 insulin resumed on 10/10. Will start at 5 units and titrated to home 16 units as indicated. Resume trulicity--ask family to bring from home if unavailable.  -- Monitor intake. Change Ensure to Ensure max and protein supplements. - 10/15 well controlled; continue to monitor CBGs and continue above regimen Recent Labs    11/09/21 1646 11/09/21 2107 11/10/21 0608  GLUCAP 117* 147* 111*       11. CKD: Baseline SCr @ 1.7. (Hyperkalemia with higher dose of ARBs.) --Improved to 1.29 with gentle hydration 10/03-10/07.  - 10/11 Cr 1.59 on intake; encourage PO fluids today, recheck in AM             - 10/12 1 L IVF for Cr. 1.44             - 10/13 - as above, mild worsening despite IVF; in interest of limiting peripheral edema and potential complications of CHF, given baseline Cr 1.7, will  encourage PO fluids and f/u Monday         - 10/16- Cr stable 1.4; monitor 12. 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              --BP goal 130-150 range for adequate cerebral perfusion.     11/10/2021    5:04 AM 11/10/2021    3:58 AM 11/09/2021    7:59 PM  Vitals with BMI  Weight 232 lbs 13 oz    BMI 35.41    Systolic  133 126  Diastolic  60 69  Pulse  66 76     13. Chronic systolic CHF/Abnormal stress test: Daily wts. Monitor for signs of overload.  --Cardiac cath 10/13 (was delayed due to renal issues) - Continue Lipitor, Zebeta, Lasix, Cozaar-->all resumed on 10/10 --Orthostatic vitals and monitor for symptoms with increase in activity.  Filed Weights   11/08/21 0500 11/09/21 0501 11/10/21 0504  Weight: 105.8 kg 105.6 kg 105.6 kg              - 10/12 - daily weights. Started gentle IVF 75 cc/hr today for ongoing AKI, continue lasix for LE edema.               -10/13 - Weights down 3 kg despite IVF; likely inaccurate  10/14-16: weights continue to be down/stable  13. H/o Hyponatremia: Due to poor intake/HCTZ per 08/17 admission.              -Na has been WNL with last Na of 137 on 10/7             - NA 141 on admission; monitor  14. ABLA: Recheck CBC in am. Last HGB 11.6 15. Dizziness: Vestibular evaluation.  16. Constipation: Has not had BM for 5 days. Will give higher dose miralax today and monitor.            - 10/11 - no BM with miralax yesterday. Increased bowel regimen to daily miralax + Sennakot S 1 tab BID. Added PRN suppository if no BM by tonight .            - LBM 10/15   LOS: 6 days A FACE TO FACE EVALUATION WAS PERFORMED  Angelina Sheriff 11/10/2021, 8:54 AM
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
## 2021-11-10 NOTE — Progress Notes (Signed)
Physical Therapy Session Note  Patient Details  Name: James Dougherty MRN: [id number] Date of Birth: 16-Sep-1950  Today's Date: 11/10/2021 PT Individual Time: 0901-0959 PT Individual Time Calculation (min): 58 min   Short Term Goals: Week 1:  PT Short Term Goal 1 (Week 1): STG=LTG secondary to ELOS  Skilled Therapeutic Interventions/Progress Updates:     Pt received seated in recliner and agrees to therapy. No complaint of pain. Pt not wearing sock or shoe on R foot and states that MD wants his foot wrapped due to open abrasion on 2nd ray. RN Becky notified and arrives to cover lesion for therapy. Pt dons shoes prior to mobility. Pt performs sit to stand with RW and cues for hand placement and body mechanics. Pt ambulates x100' to dayroom with RW and close supervision, with much improved stride length and posture relative to previous session with this therapist. PT provides cues to increase step height for improved safety.  Pt performs standing balance activity, initially with airex mat and RW. Pt stands and dunks basketball in rim over head level. PT provides cues for posture and transitioning COG over anterior portion of feet to provided optimal stability. Pt routinely loses balance backward, abruptly sitting back on mat multiple times. PT removes ariex due to repeated LOBs, and pt performs standing on stable ground. Pt has improved performance, completing 4x10 dunks without LOB.  Following rest break, pt performs activity with ariex under L foot and R foot on stable ground. Pt continues to have occasional LOBs but improved from having both feet on airex. PT provides demonstration of hip extension and glute engagement to improve balance. Pt switches feet and performs same activity. Pt progresses to performing activity with both feet on airex mat, with slightly improved balance, but continuing to have frequent LOBs backward and difficulty making postural adjustments to prevent falls.   Pt  ambulates 2x150' with RW and CGA, with cues for upright gaze to improve posture and balance, and increasing stride length to decrease risk for falls. Extended seated rest break between bouts. Pt left seated in recliner with alarm intact and all needs within reach.   Therapy Documentation Precautions:  Precautions Precautions: Fall Precaution Comments: x2 recent falls Restrictions Weight Bearing Restrictions: No   Therapy/Group: Individual Therapy  Breck Coons, PT, DPT 11/10/2021, 5:01 PM

## 2021-11-10 NOTE — Progress Notes (Signed)
Met with patient, informed that team will meet tomorrow and discuss barriers to discharge, equipment, progress, and discharge date. Discussed education binder. Patient reports that had falls at home and seen his PCP who sent him to ED d/t low sodium. Also reports that has been trying to get PCP  to approve diabetic shoes . Said that should help with balance that only has CROCS now. Discussed discharge that will be given info but will possibly have discharge date tomorrow and SW will follow up with contact. Pt reported that doesn't have all the equipment that we have. Informed him that when gets closer to discharge therapy will decide what equipment is needed at home. Patient HOH.   All needs met, patient up in recliner.

## 2021-11-10 NOTE — Progress Notes (Signed)
Occupational Therapy Session Note  Patient Details  Name: James Dougherty MRN: [id number] Date of Birth: 1950-02-24  Today's Date: 11/10/2021 OT Individual Time: 0730-0800 OT Individual Time Calculation (min): 30 min    Short Term Goals: Week 1:  OT Short Term Goal 1 (Week 1): STG=LTG d/t ELOS  Skilled Therapeutic Interventions/Progress Updates: Patient seen for am ADL's including shower. Patient up in recliner at the start of treatment session. No c/o's. Motivated to perform tasks independently as possible. Ambulated to shower with rolling walker close SBA. Patient able to sit on shower chair to doff slipper socks and gown. Provided supervision for shower, but patient able to perform without any set up required. Dressing tasks performed at the EOB, minor assistance needed when donning shoes due to LE edema and tight fit, but patient otherwise required no physical assist. Grooming tasks performed standing at the sink. Patient continues to progress with self care skills and safety awareness. Continue with skilled OT POC to achieve highest functional level.     Therapy Documentation Precautions:  Precautions Precautions: Fall Precaution Comments: x2 recent falls Restrictions Weight Bearing Restrictions: No General:   Vital Signs: Therapy Vitals Temp: 97.8 F (36.6 C) Temp Source: Oral Pulse Rate: 66 Resp: 18 BP: 133/60 Patient Position (if appropriate): Lying Oxygen Therapy SpO2: 95 % O2 Device: Room Air Pain:no c/o pain   ADL: ADL Eating: Independent  Where Assessed-Eating: Edge of bed Grooming: Supervision/safety Where Assessed-Grooming: Standing at sink Upper Body Bathing: Supervision/safety Where Assessed-Upper Body Bathing: Shower Lower Body Bathing: Supervision/safety Where Assessed-Lower Body Bathing: Shower Upper Body Dressing: Independent Where Assessed-Upper Body Dressing: Chair Lower Body Dressing: Supervision/safety Where Assessed-Lower Body Dressing:  Chair Toileting: Supervision/safety Where Assessed-Toileting: Glass blower/designer: Engineer, maintenance (IT) Method: Counselling psychologist: Energy manager: Stand by Microbiologist Method: Heritage manager: Radio broadcast assistant, Grab bars Vision- Glasses      Therapy/Group: Individual Therapy  Hermina Barters 11/10/2021, 12:32 PM

## 2021-11-10 NOTE — Progress Notes (Signed)
Occupational Therapy Progress Note  Patient Details  Name: James Dougherty MRN: [id number] Date of Birth: Sep 09, 1950  Today's Date: 11/10/2021 Session 1 OT Individual Time: 6244-6950 OT Individual Time Calculation (min): 45 min   Session 2 OT Individual Time: 1100-1200 OT Individual Time Calculation (min): 60 min   Short Term Goals: Week 1:  OT Short Term Goal 1 (Week 1): STG=LTG d/t ELOS  Skilled Therapeutic Interventions/Progress Updates:   Session 1 Pt greeted handoff from OT from previous session. Pt ambulated to therapy gym with RW and cues for increased step length, but overall supervision. Worked on standing balance/endurance standing on foam block while completing slideboard puzzle for 4 minutes. Pt took seated rest break, then stood on foam again while completing 3 sets of 10 chest press, bicep curl, and straight arm raise on 3 lb weighted bar. Pt with posterior LOB at times requiring min A to correct. Balance challenge on foam with ball chest press and oblique twist 2 sets of 5. Pt took rest break then ambulated back to room in similar fashion w/ RW. Pt left seated in recliner with alarm belt on, call bell in reach and needs met.   Session 2  Pt greeted seated in wc and agreeable to OT treatment session. Pt reported he needed to go to the bathroom. Ambulation w/ Rw an supervision into bathroom. Pt sat down and voided bladder. Pt completed toileting tasks with supervision. Practiced donning TED hose using friction reducing bag. OT gave pt gloves to help him  grip TED hose as well. Pt able to get into figure 4 position and don TED hose with only supervision from OT and cues for technique. Used yoga block to kick down hallway and work on step and follow through while ambulating. 2 standing rest breaks. Worked on anterior weight shifting using small wedge, then reaching outside base of support. CGA for balance. Pt ambulated back to room with RW and 3 standing rest breaks due to fatigue.  Pt left seated in recliner with alarm belt on, call bell in reach, and needs met.    Therapy Documentation Precautions:  Precautions Precautions: Fall Precaution Comments: x2 recent falls Restrictions Weight Bearing Restrictions: No _0 Pain:  Denies pain   Therapy/Group: Individual Therapy  Valma Cava 11/10/2021, 12:29 PM

## 2021-11-11 LAB — GLUCOSE, CAPILLARY
Glucose-Capillary: 112 mg/dL — ABNORMAL HIGH (ref 70–99)
Glucose-Capillary: 109 mg/dL — ABNORMAL HIGH (ref 70–99)
Glucose-Capillary: 95 mg/dL (ref 70–99)
Glucose-Capillary: 130 mg/dL — ABNORMAL HIGH (ref 70–99)

## 2021-11-11 NOTE — Progress Notes (Signed)
Physical Therapy Session Note  Patient Details  Name: James Dougherty MRN: [id number] Date of Birth: 01-May-1950  Today's Date: 11/11/2021 PT Individual Time: 4098-1191 and 1545-1630 PT Individual Time Calculation (min): 43 min and 45 min  Short Term Goals: Week 1:  PT Short Term Goal 1 (Week 1): STG=LTG secondary to ELOS  Skilled Therapeutic Interventions/Progress Updates:     1st Session: Pt received seated in recliner and agrees to therapy. No complaint of pain. Pt's ankles and lower legs noted to be very swollen. Dr. Tressa Busman present and recommends that pt has both legs wrapped in gauze and ace wraps up to just distal to knees to control swelling and protect small area of lesions on anterior L shin. Pt ambulates to dayroom with RW and close supervision, with cues for upright gaze to improve posture and balance, and increasing stride length to decrease risk for falls. PT wraps bilateral lower extremities in gauze and ace wraps prior to additional mobility.  Pt stands and weighs self on scale, with minA to provide stability and cues for step sequencing.  Pt performs standing balance activity, tasked with performing static standing on foam wedge to provide posterior bias which pt has to overcome. Pt routinely loses balance backward but is able to gradually improve, eventually able to stand ~1 minute with close supervision and cues for posture, upright gaze, and weight transition.   Pt ambulates back to room with RW and same cues. Left seated in recliner with alarm intact and all needs within reach.   2nd Session: Pt received seated in recliner with ted hose and shoes donned. No complaint of pain. Sit to stand with RW and cues for initiation. Pt ambulates x175' to main gym with RW and cues to increase L stride length > R as well as increasing upright gaze to improve posture and balance. Pt takes seated rest break. Pt performs sit to stand and ambulates without AD x20' with minA at hips for  stability. Seated rest break. PT provides explanation of biodex and mapping weight distribution. Pt steps onto biodex with bilateral handrails and CGA. Pt performs weight distribution visualization, initially holding onto both handrails. Pt initially has ~65% of weight in heels. With cueing pt is able to correct and have weight evenly distributed anterior to posterior. Pt progresses to performing without upper extremity support and is able to keep symmetrical WB for ~2:00.  Pt tasked with performing forward and backward ambulation to challenge dynamic balance. Pt ambulates 6' forward and 6' backward without AD, and with PT providing CGA at trunk for stability. Pt requires 11 steps for forward ambulation and x20 for backward. PT educates on importance of increasing stride length to decrease risk for falls and challenges pt to complete task in as few steps as possible. Pt performs repeated bouts and improves gait mechanics, eventually completing forward ambulation in 7 steps and backward in 11 steps. PT provide mirror for visual feedback and pt continues to perform block practices of forward and backward ambulation. Pt appears to have lateral pelvic drop stemming from hips, so PT adds green theraband around distal thighs and pt performs sidestepping at edge of mat, with CGA/minA and cues for wide steps to engage hip abductors.   Pt ambulates back to room with RW, requiring x1 brief standing rest break. Pt left seated in recliner with all needs within reach.  Therapy Documentation Precautions:  Precautions Precautions: Fall Precaution Comments: x2 recent falls Restrictions Weight Bearing Restrictions: No    Therapy/Group: Individual  Therapy  Beau Fanny, PT, DPT 11/11/2021, 5:05 PM

## 2021-11-11 NOTE — Progress Notes (Addendum)
PROGRESS NOTE   Subjective/Complaints: No acute complaints. No events overnight.  Noted increased edema in LEs overnight, had TED socks on with significant swelling above sock line, and some new open blisters on LLE. No daily weights this AM. Patient denies any limb pain, SOB, cough, chest pain, or feeling legs are heavy.   ROS: Denies fevers, chills, N/V, abdominal pain, constipation, diarrhea, SOB, cough, chest pain, new weakness or paraesthesias.    Objective:   No results found. Recent Labs    11/10/21 0536  WBC 6.3  HGB 13.5  HCT 40.3  PLT 288    Recent Labs    11/10/21 0536  NA 142  K 4.3  CL 106  CO2 27  GLUCOSE 111*  BUN 21  CREATININE 1.46*  CALCIUM 9.3     Intake/Output Summary (Last 24 hours) at 11/11/2021 0951 Last data filed at 11/11/2021 4403 Gross per 24 hour  Intake 420 ml  Output 1400 ml  Net -980 ml      Pressure Injury 11/04/21 Coccyx Mid Deep Tissue Pressure Injury - Purple or maroon localized area of discolored intact skin or blood-filled blister due to damage of underlying soft tissue from pressure and/or shear. non-blanchable purple area surrounded  (Active)  11/04/21 1634  Location: Coccyx  Location Orientation: Mid  Staging: Deep Tissue Pressure Injury - Purple or maroon localized area of discolored intact skin or blood-filled blister due to damage of underlying soft tissue from pressure and/or shear.  Wound Description (Comments): non-blanchable purple area surrounded by psoriatic redness  Present on Admission:     Physical Exam: Vital Signs Blood pressure (!) 124/54, pulse 70, temperature 97.8 F (36.6 C), resp. rate 16, height 5\' 8"  (1.727 m), weight 105.6 kg, SpO2 99 %. Gen: no distress, normal appearing, ambulating with therapy assistance with RW HEENT: +glasses, +HOH Neck: Supple without JVD or lymphadenopathy Heart: Reg rate and rhythm. No murmurs rubs or  gallops Chest: CTA bilaterally without wheezes, rales, or rhonchi; no distress Abdomen: Soft, non-tender, nondistended, bowel sounds present  Extremities: Swelling (3+ pedal edema/2+ pretibial bilaterally) - increased. 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  Psych: Pt's affect is appropriate. Pt is cooperative Skin: dry skin in b/l, psoriatic lesions on b/l elbows and sacrum, sacral areas  RUE antecubital fossa with erythema and small palpable abscess - much improved, covered in menuka honey and gauze R 2nd toe - open ares with clean base, no drainage as pictured below Mild open blistering on L medial tibia.      Neuro: AAOx4. Mild dysarthria noted.  CN 2-12 intact. MSK: Moving all 4 limbs antigravity and against resistance - 5-/5, unchanged       Assessment/Plan: 1. Functional deficits which require 3+ hours per day of interdisciplinary therapy in a comprehensive inpatient rehab setting. Physiatrist is providing close team supervision and 24 hour management of active medical problems listed below. Physiatrist and rehab team continue to assess barriers to discharge/monitor patient progress toward functional and medical goals  Care Tool:  Bathing    Body parts bathed by patient: Right arm, Right upper leg, Left upper leg, Left arm, Chest, Abdomen, Front perineal area, Left lower leg, Right lower leg,  Buttocks   Body parts bathed by helper: Right lower leg, Left lower leg Body parts n/a: Right lower leg, Left lower leg   Bathing assist Assist Level: Supervision/Verbal cueing     Upper Body Dressing/Undressing Upper body dressing   What is the patient wearing?: Pull over shirt    Upper body assist Assist Level: Set up assist    Lower Body Dressing/Undressing Lower body dressing      What is the patient wearing?: Pants     Lower body assist Assist for lower body dressing: Supervision/Verbal cueing     Toileting Toileting    Toileting assist Assist for toileting: Moderate  Assistance - Patient 50 - 74%     Transfers Chair/bed transfer  Transfers assist     Chair/bed transfer assist level: Supervision/Verbal cueing     Locomotion Ambulation   Ambulation assist      Assist level: Minimal Assistance - Patient > 75% Assistive device: Walker-rolling Max distance: 100'   Walk 10 feet activity   Assist     Assist level: Minimal Assistance - Patient > 75% Assistive device: Walker-rolling   Walk 50 feet activity   Assist    Assist level: Minimal Assistance - Patient > 75% Assistive device: Walker-rolling    Walk 150 feet activity   Assist Walk 150 feet activity did not occur: Safety/medical concerns         Walk 10 feet on uneven surface  activity   Assist     Assist level: Minimal Assistance - Patient > 75% Assistive device: Walker-rolling   Wheelchair     Assist Is the patient using a wheelchair?: No             Wheelchair 50 feet with 2 turns activity    Assist            Wheelchair 150 feet activity     Assist          Blood pressure (!) 124/54, pulse 70, temperature 97.8 F (36.6 C), resp. rate 16, height 5\' 8"  (1.727 m), weight 105.6 kg, SpO2 99 %.    Medical Problem List and Plan: 1. 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  Antithrombotics: -DVT/anticoagulation:  Mechanical: Sequential compression devices, below knee Bilateral lower extremities             -antiplatelet therapy: --off DAPT due to SDH 3. Pain Management:  tylenol and robaxin qid effective--not taking any prns  4. Mood/Behavior/Sleep: LCSW to follow for evaluation and support.              --trazodone prn for insomnia.              -antipsychotic agents: N/A 5. Neuropsych/cognition: This patient is capable of making decisions on his own behalf. 6. Chronic  lymphedema/Skin/Wound Care: Wounds treated with Aquacell and compressive wraps per wound clinic-->continue.             - Increase wrap to 1 inch below knees for edema management - reinforced 10/16, TED hose not extending far enough up leg for edema control and now with recurrent blistering            - Emla steroid cream  to R elbow, gluteal psoriatic lesions. Notified nursing to remove sacral heart as note DTI. - reinforced 10/13            - RUE abscess at former IV line - start Doxy 100 mg BID for MRSA coverage x5 days. Apply manuka honey daily. - improved           - RLE 2nd toe - wound care per nursing, cover in mepilax  7. Fluids/Electrolytes/Nutrition: Monitor I/O. Check CMET in am.            - 10/13 - mild increase in  Cr, encourage PO fluids         - 10/12 - Mild AKI, encouraging PO fluids, gentle IVF 75/hr today. Recheck in AM. Cr despite IVF; however, noted baseline Cr 1.7, in setting of ongoing peripheral edema and CHF will stop IVF and f/u labs Monday        - 10/16- Cr stable 1.4; monitor  8. Seizure prophylaxis: Completed 7 day course of Keppra this am.              -Repeat head CT in 2 weeks.  9. HTN: Monitor BP TID--trending up and cozaar 12.5mg  added on 10/10, continue  10/15: BP is soft today, flowsheet reviewed and it fluctuates, continue coreg 10. T2DM: Hgb A1c- 6.4 and well controlled on home regimen --will monitor BS ac/hs -->CBG running 110-130 at this time on SSI             --70/30 insulin resumed on 10/10. Will start at 5 units and titrated to home 16 units as indicated. Resume trulicity--ask family to bring from home if unavailable.  -- Monitor intake. Change Ensure to Ensure max and protein supplements. - 10/15-17 well controlled; continue to monitor CBGs and continue above regimen Recent Labs    11/10/21 1656 11/10/21 2051 11/11/21 0630  GLUCAP 144* 142* 112*       11. CKD: Baseline SCr @ 1.7. (Hyperkalemia with higher dose of ARBs.) --Improved to 1.29  with gentle hydration 10/03-10/07.  - 10/11 Cr 1.59 on intake; encourage PO fluids today, recheck in AM             - 10/12 1 L IVF for Cr. 1.44             - 10/13 - as above, mild worsening despite IVF; in interest of limiting peripheral edema and potential complications of CHF, given baseline Cr 1.7, will encourage PO fluids and f/u Monday         - 10/16- Cr stable 1.4; monitor  12. 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              --BP goal 130-150 range for adequate cerebral perfusion.     11/11/2021    4:30 AM 11/10/2021    7:35 PM 11/10/2021    2:44 PM  Vitals with BMI  Systolic 124 165 818  Diastolic 54 66 68  Pulse 70 73 71     13. Chronic systolic CHF/Abnormal stress test: Daily wts. Monitor for signs of overload.  --Cardiac cath 10/13 (was delayed due to renal issues) - Continue Lipitor, Zebeta, Lasix, Cozaar-->all resumed on 10/10 --Orthostatic vitals and monitor for symptoms with increase in activity.  Filed Weights   11/08/21 0500 11/09/21 0501 11/10/21 0504  Weight: 105.8 kg 105.6 kg 105.6 kg              - 10/12 - daily weights. Started gentle  IVF 75 cc/hr today for ongoing AKI, continue lasix for LE edema.               -10/13 - Weights down 3 kg despite IVF; likely inaccurate  10/14-16: weights continue to be down/stable; pending weights 10/17, if increased with edema will give 1x Lasix 20 mg tab today  13. H/o Hyponatremia: Due to poor intake/HCTZ per 08/17 admission.              -Na has been WNL with last Na of 137 on 10/7             - NA 141 on admission; monitor  14. ABLA: Recheck CBC in am. Last HGB 11.6 15. Dizziness: Vestibular evaluation.  16. Constipation: Has not had BM for 5 days. Will give higher dose miralax today and monitor.            - 10/11 - no BM with miralax yesterday. Increased bowel regimen to daily miralax + Sennakot S 1 tab BID. Added PRN suppository if no BM by tonight .            - LBM 10/16, L BM  LOS: 7 days - 10/20 to  home with daughter A FACE TO FACE EVALUATION WAS PERFORMED  Angelina Sheriff 11/11/2021, 9:51 AM

## 2021-11-11 NOTE — Patient Care Conference (Signed)
Inpatient RehabilitationTeam Conference and Plan of Care Update Date: 11/11/2021   Time: 10:30 AM   Patient Name: James Dougherty      Medical Record Number: [id number]  Date of Birth: [birth year] Sex: Male         Room/Bed: 4W02C/4W02C-01 Payor Info: Payor: Theme park manager MEDICARE / Plan: [employer]HC MEDICARE / Product Type: *No Product type* /    Admit Date/Time:  11/04/2021  3:45 PM  Primary Diagnosis:  TBI (traumatic brain injury) [facility])  Hospital Problems: Principal Problem:   TBI (traumatic brain injury) [facility])    Expected Discharge Date: Expected Discharge Date: 11/14/21  Team Members Present: Physician leading conference: Other (comment) (Dr Durel Salts) Social Worker Present: Loralee Pacas, [place] Nurse Present: Other (comment) Tacy Learn, RN) PT Present: Tereasa Coop, PT OT Present: Cherylynn Ridges, OT PPS Coordinator present : Gunnar Fusi, SLP     Current Status/Progress Goal Weekly Team Focus  Bowel/Bladder    Continent x 2  Remain continent Toilet every 2/3 hours and PRN  Swallow/Nutrition/ Hydration             ADL's   CGA overall, still has posterior LOB  Supervision  LB ADLs, sit<>stands, transfers, pt/family education, dc planning   Mobility   supervision bed mobility, tranfers, ambulation with RW. High fall risk.  Supervision  DC prep, balance, endurance, ambulation   Communication             Safety/Cognition/ Behavioral Observations            Pain   Denies pain   Remain pain free Assess for pain every 4 hours and PRN  Skin   Redness to buttocks  Infection at old IV site     Skin CDI  Assess every shift and prn      Discharge Planning:  Pt will discharge with support from dtr. Pt dtr is willing to allow pt to stay at her home in [place], and willing to transport pt back into [place] for therapies (if outpatient) since she lives [place] minutes from [employer]. SW will conrirm therapies needed HH or OPT.   Team Discussion: TBI. Multiple falls at  home. Continent B/B. Denies pain. 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 Old IV site healing. Monitor for swelling to lower extremities, SOB, compression hose to be used at home. Lasix adjusted. Has loss of posterior balance. Family reports speech/cognition better. Outpatient therapy. Discharge after family education on the 20th.  Patient on target to meet rehab goals: yes, discharged from speech  *See Care Plan and progress notes for long and short-term goals.   Revisions to Treatment Plan:  Medication adjustments, assess for additional swelling of lower extremities and SOB  Teaching Needs: Medications, CHF teaching, safety, skin/wound care, gait/transfer training, etc.   Current Barriers to Discharge: Decreased caregiver support, Medical stability, Wound care, Weight, and Medication compliance  Possible Resolutions to Barriers: Family education, fall prevention, order recommended DME     Medical Summary Current Status: stable  Barriers to Discharge: Home enviroment access/layout;Medical stability;Weight;Wound care  Barriers to Discharge Comments: edema management, wounds, hard of hearing Possible Resolutions to Barriers/Weekly Focus: ongoing wound care, medication adjustments   Continued Need for Acute Rehabilitation Level of Care: The patient requires daily medical management by a physician with specialized training in physical medicine and rehabilitation for the following reasons: Direction of a multidisciplinary physical rehabilitation program to maximize functional independence : Yes Medical management of patient stability for increased activity during participation in an intensive rehabilitation regime.:  Yes Analysis of laboratory values and/or radiology reports with any subsequent need for medication adjustment and/or medical intervention. : Yes   I attest that I was present, lead the team conference, and concur with the assessment and plan of the team.   Jearld Adjutant 11/11/2021,  2:40 PM

## 2021-11-11 NOTE — Progress Notes (Signed)
Occupational Therapy Session Note  Patient Details  Name: James Dougherty MRN: [id number] Date of Birth: August 08, 1950  Today's Date: 11/11/2021 Session 1 OT Individual Time: 1100-1210 OT Individual Time Calculation (min): 70 min   Session 2 OT Individual Time: 1405-1500 OT Individual Time Calculation (min): 55 min    Short Term Goals: Week 1:  OT Short Term Goal 1 (Week 1): STG=LTG d/t ELOS  Skilled Therapeutic Interventions/Progress Updates:  Session 1   Pt greeted seated in wc and agreeable to OT treatment session. Discussed dc plan with pt and need for someone to provide supervision for him. Pt does not like the idea of going to his daughters house, but after discussion on high fall risk, pt more agreeable. Pt requested to shower. B LE Ace and krilex wrap removed for shower. Pt completed functional ambulation with RW and close supervision. Pt needed cues for safety to sit down and doff clothing as pulling shirt overhead can cause LOB. OT issused LH sponge to increase access to lower legs and back. Bathing completed with overall CGA when standing to wash buttocks. Dressing tasks completed seated in reacliner. Reviewed techniques for donning TED hose. Pt wanted to try his own way today. Pt able to don TEDs with supportive back and in figure 4 position. Increased time and effort, but able to complete. Pt left seated in wc with alarm belt on, call bell in reach, and needs met.  Session 2 Pt greeted seated in recliner and agreeable to OT treatment session. Pt ambulated to therapy gym with RW and cues for LLE clearance. General strength and endurance with NuStep on level 4, 15 minutes with rest breaks at every 5 minute interval. Addressed standing balance on foam block while reaching to pull clothes pins off of gait belt, then place outside base of support. CGA/minA for balance in this context.Pt with multiple posterior LOB's and unable to correct.  Had pt remove clothes pins and  place them on  lower surface to the R to facilitate anterior weight shift and weight shift to the R. Pt ambulated back to room and left seated in recliner with alarm belt on, call bell in reach, and needs met.   Therapy Documentation Precautions:  Precautions Precautions: Fall Precaution Comments: x2 recent falls Restrictions Weight Bearing Restrictions: No Pain:  Denies pain   Therapy/Group: Individual Therapy  Valma Cava 11/11/2021, 3:07 PM

## 2021-11-11 NOTE — Progress Notes (Signed)
Patient ID: James Dougherty, male   DOB: [birth year], 71 y.o.   MRN: [id number]  SW met with pt in room to provide updates from team conference, and d/c date 10/20. SW reiterated that 24/7 care will be needed at discharge. He is reluctant to stay with his dtr but willing to go to her home. He is aware outpatient referral will be sent to [facility] location for PT/OT/SLP.  1311-SW spoke with pt dtr James Dougherty to provide above updates and reiterating 24/7 care. SW shared in outpatient therapy they will discuss when 24/7 care is no longer needed. She also reported that neurology told her that her father will need another CT scan within two weeks and would like to know if this will occur while he is here. She will be here on Friday for fam edu 10am-12pm. SW shared with medical team on above.   Loralee Pacas, MSW, [place] Office: [phone] Cell: [phone] Fax: [phone]

## 2021-11-12 ENCOUNTER — Other Ambulatory Visit (HOSPITAL_COMMUNITY): Payer: Self-pay

## 2021-11-12 ENCOUNTER — Inpatient Hospital Stay (HOSPITAL_COMMUNITY): Payer: Medicare Other

## 2021-11-12 LAB — GLUCOSE, CAPILLARY
Glucose-Capillary: 109 mg/dL — ABNORMAL HIGH (ref 70–99)
Glucose-Capillary: 101 mg/dL — ABNORMAL HIGH (ref 70–99)
Glucose-Capillary: 124 mg/dL — ABNORMAL HIGH (ref 70–99)
Glucose-Capillary: 96 mg/dL (ref 70–99)

## 2021-11-12 MED ORDER — ACETAMINOPHEN 325 MG PO TABS
650.0000 mg | ORAL_TABLET | Freq: Three times a day (TID) | ORAL | 0 refills | Status: DC
  Filled 2021-11-12: qty 30, 4d supply, fill #0

## 2021-11-12 MED ORDER — GABAPENTIN 600 MG PO TABS
600.0000 mg | ORAL_TABLET | Freq: Every evening | ORAL | 0 refills | Status: DC
  Filled 2021-11-12: qty 30, 30d supply, fill #0

## 2021-11-12 MED ORDER — LOSARTAN POTASSIUM 25 MG PO TABS
12.5000 mg | ORAL_TABLET | Freq: Every day | ORAL | 0 refills | Status: DC
  Filled 2021-11-12: qty 30, 60d supply, fill #0

## 2021-11-12 MED ORDER — ATORVASTATIN CALCIUM 20 MG PO TABS
20.0000 mg | ORAL_TABLET | Freq: Every day | ORAL | 0 refills | Status: DC
  Filled 2021-11-12: qty 30, 30d supply, fill #0

## 2021-11-12 MED ORDER — NOVOLIN 70/30 RELION (70-30) 100 UNIT/ML ~~LOC~~ SUSP: 16.0000 [IU] | Freq: Every evening | SUBCUTANEOUS | 12 refills | Status: DC

## 2021-11-12 MED ORDER — METHOCARBAMOL 750 MG PO TABS
750.0000 mg | ORAL_TABLET | Freq: Three times a day (TID) | ORAL | 0 refills | Status: DC
  Filled 2021-11-12: qty 90, 30d supply, fill #0

## 2021-11-12 MED ORDER — LOSARTAN POTASSIUM 25 MG PO TABS: 12.5000 mg | ORAL_TABLET | Freq: Every day | ORAL | 0 refills | Status: DC

## 2021-11-12 MED ORDER — TRIAMCINOLONE ACETONIDE 0.1 % EX CREA
1.0000 | TOPICAL_CREAM | Freq: Two times a day (BID) | CUTANEOUS | 0 refills | Status: DC
  Filled 2021-11-12: qty 30, 15d supply, fill #0

## 2021-11-12 MED ORDER — ATORVASTATIN CALCIUM 20 MG PO TABS: 20.0000 mg | ORAL_TABLET | Freq: Every day | ORAL | 0 refills | Status: DC

## 2021-11-12 MED ORDER — TRAZODONE HCL 50 MG PO TABS
25.0000 mg | ORAL_TABLET | Freq: Every evening | ORAL | 0 refills | Status: DC | PRN
  Filled 2021-11-12: qty 15, 30d supply, fill #0

## 2021-11-12 MED ORDER — FUROSEMIDE 20 MG PO TABS
20.0000 mg | ORAL_TABLET | Freq: Every day | ORAL | 0 refills | Status: DC
  Filled 2021-11-12: qty 30, 30d supply, fill #0

## 2021-11-12 MED ORDER — POLYETHYLENE GLYCOL 3350 17 GM/SCOOP PO POWD
17.0000 g | Freq: Every day | ORAL | 0 refills | Status: DC
  Filled 2021-11-12: qty 238, 14d supply, fill #0

## 2021-11-12 MED ORDER — BISOPROLOL FUMARATE 5 MG PO TABS
2.5000 mg | ORAL_TABLET | Freq: Every day | ORAL | 0 refills | Status: DC
  Filled 2021-11-12: qty 30, 60d supply, fill #0

## 2021-11-12 MED ORDER — SENNOSIDES-DOCUSATE SODIUM 8.6-50 MG PO TABS
1.0000 | ORAL_TABLET | Freq: Two times a day (BID) | ORAL | 0 refills | Status: DC
  Filled 2021-11-12: qty 60, 30d supply, fill #0

## 2021-11-12 NOTE — Progress Notes (Signed)
Patient ID: James Dougherty, male   DOB: [birth year], 71 y.o.   MRN: [id number]  SW faxed outpatient PT/OT referral to [place] location (p:[phone]/f:[phone]). *SW spoke with Stanton Kidney to discuss referral and confirms referral has been received.   Loralee Pacas, MSW, [place] Office: [phone] Cell: [phone] Fax: [phone]

## 2021-11-12 NOTE — Progress Notes (Signed)
Occupational Therapy Session Note  Patient Details  Name: James Dougherty MRN: [id number] Date of Birth: 15-Oct-1950  Today's Date: 11/12/2021 Session 1 OT Individual Time: 1110-1210 OT Individual Time Calculation (min): 60 min   Session 2 OT Individual Time: 9326-7124 OT Individual Time Calculation (min): 30 min    Short Term Goals: Week 1:  OT Short Term Goal 1 (Week 1): STG=LTG d/t ELOS  Skilled Therapeutic Interventions/Progress Updates:  Session 1   Pt greeted sitting in recliner and agreeable to OT treatment session. Discussed dc plan, DME needs, and set-up of daughters house. Pt then ambulated to therapy apartment with RW and supervision. OT set-up tub transfer bench and practiced sliding over tub ledge with supervision. Also practiced stepping over tub ledge with min A and use of grab bars. Educated pt on tub bench being the safest way independence with showering. OT also educated on how to tuck in shower curtain as this was a concern for patient. Practiced furniture transfers with education provided on weight shifting to stand. Pt then ambulated to therapy gym and worked on L fine motor coordination and shoulder strength with small peg board task placed vertically on mirror to facilitate more shoulder flexion and wirst extension. Graded task to using tweezers to take off pegs. Pt ambulated back to room set-up for lunch.   Session 2 Pt seated in recliner after lunch and agreeable to OT treatment session. Pt ambulated to therapy gym with RW and supervision. Addressed dynamic balance and functional use of L UE with Cornhole toss. Added balance challenge to step when tossing, then return to center. Min/CGA for dynamic balance.  Ambulated without RW to reach and collect bean bags when reaching forward-min A. Educated on risk of falls and recommended pt not reach down to pick up items without physical assist from someone. UB there-ex using 4 lb dowel rod, chest press, bicep curl, straight  arm raise, and upright row. Pt ambulated back to room without AD and CGA with cues for L LE clearance. Pt left seated in recliner with alarm belt on, call bell in reach, and needs met.   Therapy Documentation Precautions:  Precautions Precautions: Fall Precaution Comments: x2 recent falls Restrictions Weight Bearing Restrictions: No Pain:  Denies pain   Therapy/Group: Individual Therapy  Valma Cava 11/12/2021, 2:33 PM

## 2021-11-12 NOTE — Progress Notes (Signed)
Dr. Marcello Moores and Weston Brass NP for [facility] have reviewed films and felt that no herniation noted. Continue to hold ASA/Plavix, repeat CT head in 2 weeks for follow up.  Their office will contact Daughter to set up  appt/Xrays.

## 2021-11-12 NOTE — Discharge Summary (Signed)
Physician Discharge Summary  Patient ID: JAECEON MICHELIN MRN: [id number] DOB/AGE: [id number] y.o.  Admit date: 11/04/2021 Discharge date: 11/14/2021  Discharge Diagnoses:  Principal Problem:   TBI (traumatic brain injury) (HCC) Active Problems:   Peripheral vascular disease, unspecified (HCC)   Anemia   Chronic kidney disease   Type II diabetes mellitus with complication (HCC)   Discharged Condition: stable  Significant Diagnostic Studies: CT HEAD WO CONTRAST ( )  Result Date: 11/12/2021 CLINICAL DATA:  Subdural hematoma follow-up EXAM: CT HEAD WITHOUT CONTRAST TECHNIQUE: Contiguous axial images were obtained from the base of the skull through the vertex without intravenous contrast. RADIATION DOSE REDUCTION: This exam was performed according to the departmental dose-optimization program which includes automated exposure control, adjustment of the mA and/or kV according to patient size and/or use of iterative reconstruction technique. COMPARISON:  10/4/23CT Brain FINDINGS: Brain: Redemonstrated left cerebral convexity subdural hematoma which has increased in size compared to prior exam now measuring up to 1.2 cm, previously 7 mm when measured in a similar orientation. The amount of hyperdense blood products within the subdural hematoma has decreased, but persists. There is interval decrease in the amount of subdural blood products seen along the falx. Compared to prior exam there is interval increase in the degree of rightward midline shift now measuring to 7 mm, previously up to 4 mm when measured in a similar orientation. Compared to prior exam there is also asymmetric effacement of the left basal cistern (series 100, image 27). There is also increased mass effect on the left lateral ventricular system. No evidence of ventricular entrapment. 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 Compared to prior exam there is increased hypodensity along the medial aspect of the  left frontal lobe with poor visualization of the gray-white junction (series 4, image 13). Vascular: No hyperdense vessel or unexpected calcification. Skull: Normal. Negative for fracture or focal lesion. Sinuses/Orbits: No acute finding. Other: None. IMPRESSION: 1. Interval increase in size of left cerebral convexity subdural hematoma with increased rightward midline shift and effacement of the basal cistern. 2. Compared to prior exam there is increased hypodensity along the medial aspect of the left frontal lobe with poor visualization of the gray-white junction. Although this may be artifactual, it could also represent an infarct in the setting of subfalcine herniation. Findings were discussed with Dr. Shearon Stalls on 11/12/21 at 8:32 AM. Electronically Signed   By: Lorenza Cambridge M.D.   On: 11/12/2021 08:34   VAS US CAROTID  Result Date: 10/30/2021 Carotid Arterial Duplex Study Patient Name:  DARAY POLGAR [facility]  Date of Exam:   10/29/2021 Medical Rec #: [id number]        Accession #:    [id number] Date of Birth: Sep 13, 1950        Patient Gender: M Patient Age:   56 years Exam Location:  [facility] Procedure:      VAS US CAROTID Referring Phys: Scheryl Marten XU --------------------------------------------------------------------------------  Indications:       CVA. Risk Factors:      Hypertension, hyperlipidemia, Diabetes, prior CVA, PAD. Comparison Study:  02/06/21 prior Performing Technologist: [place] Ponder RVS  Examination Guidelines: A complete evaluation includes B-mode imaging, spectral Doppler, color Doppler, and power Doppler as needed of all accessible portions of each vessel. Bilateral testing is considered an integral part of a complete examination. Limited examinations for reoccurring indications may be performed as noted.  Right Carotid Findings: +---------+--------+-------+--------+---------------------------------+--------+          PSV cm/sEDV  StenosisPlaque Description                Comments                  cm/s                                                     +---------+--------+-------+--------+---------------------------------+--------+ CCA Prox 60      13             heterogenous                              +---------+--------+-------+--------+---------------------------------+--------+ CCA      90      10             heterogenous                              Distal                                                                    +---------+--------+-------+--------+---------------------------------+--------+ ICA Prox 440     148    80-99%  heterogenous, irregular and                                               calcific                                  +---------+--------+-------+--------+---------------------------------+--------+ ICA Mid  310     73                                                       +---------+--------+-------+--------+---------------------------------+--------+ ICA      67      21                                                       Distal                                                                    +---------+--------+-------+--------+---------------------------------+--------+ ECA      276                                                              +---------+--------+-------+--------+---------------------------------+--------+ +----------+--------+-------+--------+-------------------+  PSV cm/sEDV cmsDescribeArm Pressure (mmHG) +----------+--------+-------+--------+-------------------+ Subclavian110                                        +----------+--------+-------+--------+-------------------+ +---------+--------+--+--------+--+ VertebralPSV cm/s36EDV cm/s12 +---------+--------+--+--------+--+  Left Carotid Findings: +----------+--------+--------+--------+--------------------------+--------+           PSV cm/sEDV cm/sStenosisPlaque Description         Comments +----------+--------+--------+--------+--------------------------+--------+ CCA Prox  91      19              heterogenous                       +----------+--------+--------+--------+--------------------------+--------+ CCA Distal93      20              heterogenous                       +----------+--------+--------+--------+--------------------------+--------+ ICA Prox  217     68      60-79%  heterogenous and irregular         +----------+--------+--------+--------+--------------------------+--------+ ICA Mid   143     38                                                 +----------+--------+--------+--------+--------------------------+--------+ ICA Distal72      20                                                 +----------+--------+--------+--------+--------------------------+--------+ ECA       132                                                        +----------+--------+--------+--------+--------------------------+--------+ +----------+--------+--------+--------+-------------------+           PSV cm/sEDV cm/sDescribeArm Pressure (mmHG) +----------+--------+--------+--------+-------------------+ [id number]                                         +----------+--------+--------+--------+-------------------+ +---------+--------+--+--------+-+---------+ VertebralPSV cm/s31EDV cm/s8Antegrade +---------+--------+--+--------+-+---------+   Summary: Right Carotid: Velocities in the right ICA are consistent with a 80-99%                stenosis. Left Carotid: Velocities in the left ICA are consistent with a 60-79% stenosis. Vertebrals: Bilateral vertebral arteries demonstrate antegrade flow. *See table(s) above for measurements and observations.  Electronically signed by Delia Heady MD on 10/30/2021 at 5:27:58 PM.    Final    CT HEAD WO CONTRAST ( )  Result Date: 10/29/2021 CLINICAL DATA:  Follow-up subdural hematoma EXAM: CT HEAD  WITHOUT CONTRAST TECHNIQUE: Contiguous axial images were obtained from the base of the skull through the vertex without intravenous contrast. RADIATION DOSE REDUCTION: This exam was performed according to the departmental dose-optimization program which includes automated exposure control, adjustment of the mA and/or kV according to patient size and/or use of iterative reconstruction technique. COMPARISON:  Yesterday FINDINGS: Brain:  Primarily high-density subdural hematoma around the left cerebral convexity, both lateral and inter hemispheric, up to 1 cm in thickness laterally, unchanged. Mild mass effect with 4 mm of midline shift. Chronic small vessel ischemia in the cerebral white matter. Mixed density subdural hematoma along the right cerebral convexity measuring 3 mm in thickness. No acute gray matter infarct or hydrocephalus. Vascular: No hyperdense vessel or unexpected calcification. Skull: Normal. Negative for fracture or focal lesion. Sinuses/Orbits: Negative IMPRESSION: Unchanged acute left and mixed density right subdural hematomas, on the left measuring up to 1 cm thickness. Electronically Signed   By: Tiburcio Pea M.D.   On: 10/29/2021 05:09   CT HEAD WO CONTRAST ( )  Result Date: 10/28/2021 CLINICAL DATA:  Head trauma EXAM: CT HEAD WITHOUT CONTRAST TECHNIQUE: Contiguous axial images were obtained from the base of the skull through the vertex without intravenous contrast. RADIATION DOSE REDUCTION: This exam was performed according to the departmental dose-optimization program which includes automated exposure control, adjustment of the mA and/or kV according to patient size and/or use of iterative reconstruction technique. COMPARISON:  10/28/2021 FINDINGS: Brain: Unchanged left hemispheric mixed density subdural hematoma that measures up to 8 mm thick. The para falcine component is also unchanged. Rightward midline shift is unchanged at 5 mm. Unchanged subacute to chronic right convexity small  subdural hematoma. Vascular: No hyperdense vessel or unexpected calcification. Skull: Normal. Negative for fracture or focal lesion. Sinuses/Orbits: No acute finding. Other: None. IMPRESSION: Unchanged left hemispheric mixed density subdural hematoma with 5 mm of rightward midline shift. Electronically Signed   By: Deatra Robinson M.D.   On: 10/28/2021 21:48   EEG adult  Result Date: 10/28/2021 Charlsie Quest, MD     10/28/2021  5:21 PM Patient Name: TRYSTIN TERHUNE MRN: [id number] Epilepsy Attending: Charlsie Quest Referring Physician/Provider: Elmer Picker, NP Date: 10/28/2021 Duration: 24.27 mins Patient history: 71 y.o. male with past medical history of arthritis, hyperlipidemia, hypertension, neuropathic pain, PAD, psoriasis, stroke, diabetes brought in by EMS as a level 2 trauma after he fell and hit the back of his head while he was at the tire shop with his son.  Last known well 1200 which is when the fall had occurred.  CT head confirmed a left subdural hematoma with a 5 mm rightward midline shift as well as a trace subarachnoid hemorrhage in left sylvian fissure.  There is a current concern for potential seizure activity based on observation "spaced out" while driving. EEG to evaluate for seizure Level of alertness: Awake, drowsy AEDs during EEG study: LEV Technical aspects: This EEG study was done with scalp electrodes positioned according to the 10-20 International system of electrode placement. Electrical activity was reviewed with band pass filter of 1-70Hz , sensitivity of 7 uV/mm, display speed of 66mm/sec with a  notched filter applied as appropriate. EEG data were recorded continuously and digitally stored.  Video monitoring was available and reviewed as appropriate. Description: The posterior dominant rhythm consists of 7.5 Hz activity of moderate voltage (25-35 uV) seen predominantly in posterior head regions, symmetric and reactive to eye opening and eye closing. Drowsiness was  characterized by attenuation of the posterior background rhythm. Hyperventilation and photic stimulation were not performed.   IMPRESSION: This study is within normal limits. No seizures or epileptiform discharges were seen throughout the recording. A normal interictal EEG does not exclude the diagnosis of epilepsy. Charlsie Quest   DG Pelvis Portable  Result Date: 10/28/2021 CLINICAL DATA:  Trauma, fall EXAM: PORTABLE PELVIS 1-2  VIEWS COMPARISON:  None Available. FINDINGS: No displaced fracture or dislocation is seen. Degenerative changes are noted in both hips. Degenerative changes are noted in the visualized lower lumbar spine. Arterial calcifications are seen. There multiple arterial stents in right iliac and femoral vessels in both lower extremity. IMPRESSION: No recent fracture or dislocation is seen. Degenerative changes are noted in both hips and lumbar spine. Significant atherosclerosis. Electronically Signed   By: Ernie Avena M.D.   On: 10/28/2021 16:46   CT Cervical Spine Wo Contrast  Result Date: 10/28/2021 CLINICAL DATA:  Code stroke. Code stroke with right-sided weakness, fall. EXAM: CT HEAD WITHOUT CONTRAST CT CERVICAL SPINE WITHOUT CONTRAST TECHNIQUE: Multidetector CT imaging of the head and cervical spine was performed following the standard protocol without intravenous contrast. Multiplanar CT image reconstructions of the cervical spine were also generated. RADIATION DOSE REDUCTION: This exam was performed according to the departmental dose-optimization program which includes automated exposure control, adjustment of the mA and/or kV according to patient size and/or use of iterative reconstruction technique. COMPARISON:  CTA neck 03/19/2018 FINDINGS: CT HEAD FINDINGS Brain: There is an acute left cerebral convexity subdural hematoma measuring up to 1.1 cm in maximal thickness. Swirling hypodensity within this hematoma may reflect hyperacute blood/active bleeding. Additional acute  subdural blood layering along the left aspect of the falx measures up to 0.6 cm in thickness. There is a smaller right cerebral convexity subdural hematoma which appears more subacute in density measuring up to 3-4 mm in maximal thickness. Is trace subarachnoid hemorrhage in the left sylvian fissure There is mass effect on the underlying brain parenchyma exerted by the left-sided hematoma resulting in sulcal effacement, partial effacement of the left lateral ventricle, and 5 mm rightward midline shift. There is no evidence of acute territorial infarct. Background parenchymal volume is normal. The ventricles are otherwise normal in size. There is patchy and confluent hypodensity in the supratentorial white matter likely reflecting sequela of underlying chronic small vessel ischemic change and remote infarcts. There is no solid mass lesion. Vascular: There is calcification of the bilateral carotid siphons. Skull: Normal. Negative for fracture or focal lesion. Sinuses/Orbits: The imaged paranasal sinuses are clear. The globes and orbits are unremarkable. Other: None. CT CERVICAL SPINE FINDINGS Alignment: Normal. There is no jumped or perched facet or other evidence of traumatic malalignment. Skull base and vertebrae: Skull base alignment is maintained. Vertebral body heights are preserved. There is no suspicious osseous lesion. Soft tissues and spinal canal: No prevertebral fluid or swelling. No visible canal hematoma. There is prominent posterior longitudinal ligament thickening with a large protrusion at C3-C4 resulting in moderate to severe spinal canal stenosis with likely cord compression, similar to the CTA neck from 03/19/2021. Disc levels: There is disc space narrowing degenerative endplate change most advanced at C5-C6. There is overall mild-to-moderate multilevel facet arthropathy. As above, there is up to moderate to severe spinal canal stenosis at C3-C4. Upper chest: There is interlobular septal thickening  in the lung apices suggesting pulmonary edema. Other: None. IMPRESSION: 1. Acute left cerebral convexity subdural hematoma measuring up to 1.1 cm in thickness with the additional subdural blood layering along the left aspect of the falx resulting in partial effacement of the left lateral ventricle and up to 5 mm rightward midline shift. Swirling hypodensity within this hematoma may reflect hyperacute blood/active bleeding. 2. Smaller more subacute appearing right cerebral convexity subdural hematoma measuring up to 3-4 mm in thickness. 3. Trace subarachnoid hemorrhage in the left sylvian fissure. 4. No  acute fracture or traumatic malalignment of the cervical spine. 5. Prominent posterior longitudinal ligament thickening and large protrusion at C3-C4 resulting in moderate to severe spinal canal stenosis with likely cord compression, similar to the CTA neck from 03/19/2018. Consider further evaluation with nonemergent cervical spine MRI as indicated. 6. Interlobular septal thickening in the lung apices suggesting pulmonary edema. Critical Value/emergent results were called by telephone at the time of interpretation on 10/28/2021 at 2:58 pm to provider Dr Roderic Palau, who verbally acknowledged these results. Electronically Signed   By: Valetta Mole M.D.   On: 10/28/2021 15:12   CT HEAD CODE STROKE WO CONTRAST  Result Date: 10/28/2021 CLINICAL DATA:  Code stroke. Code stroke with right-sided weakness, fall. EXAM: CT HEAD WITHOUT CONTRAST CT CERVICAL SPINE WITHOUT CONTRAST TECHNIQUE: Multidetector CT imaging of the head and cervical spine was performed following the standard protocol without intravenous contrast. Multiplanar CT image reconstructions of the cervical spine were also generated. RADIATION DOSE REDUCTION: This exam was performed according to the departmental dose-optimization program which includes automated exposure control, adjustment of the mA and/or kV according to patient size and/or use of iterative  reconstruction technique. COMPARISON:  CTA neck 03/19/2018 FINDINGS: CT HEAD FINDINGS Brain: There is an acute left cerebral convexity subdural hematoma measuring up to 1.1 cm in maximal thickness. Swirling hypodensity within this hematoma may reflect hyperacute blood/active bleeding. Additional acute subdural blood layering along the left aspect of the falx measures up to 0.6 cm in thickness. There is a smaller right cerebral convexity subdural hematoma which appears more subacute in density measuring up to 3-4 mm in maximal thickness. Is trace subarachnoid hemorrhage in the left sylvian fissure There is mass effect on the underlying brain parenchyma exerted by the left-sided hematoma resulting in sulcal effacement, partial effacement of the left lateral ventricle, and 5 mm rightward midline shift. There is no evidence of acute territorial infarct. Background parenchymal volume is normal. The ventricles are otherwise normal in size. There is patchy and confluent hypodensity in the supratentorial white matter likely reflecting sequela of underlying chronic small vessel ischemic change and remote infarcts. There is no solid mass lesion. Vascular: There is calcification of the bilateral carotid siphons. Skull: Normal. Negative for fracture or focal lesion. Sinuses/Orbits: The imaged paranasal sinuses are clear. The globes and orbits are unremarkable. Other: None. CT CERVICAL SPINE FINDINGS Alignment: Normal. There is no jumped or perched facet or other evidence of traumatic malalignment. Skull base and vertebrae: Skull base alignment is maintained. Vertebral body heights are preserved. There is no suspicious osseous lesion. Soft tissues and spinal canal: No prevertebral fluid or swelling. No visible canal hematoma. There is prominent posterior longitudinal ligament thickening with a large protrusion at C3-C4 resulting in moderate to severe spinal canal stenosis with likely cord compression, similar to the CTA neck  from 03/19/2021. Disc levels: There is disc space narrowing degenerative endplate change most advanced at C5-C6. There is overall mild-to-moderate multilevel facet arthropathy. As above, there is up to moderate to severe spinal canal stenosis at C3-C4. Upper chest: There is interlobular septal thickening in the lung apices suggesting pulmonary edema. Other: None. IMPRESSION: 1. Acute left cerebral convexity subdural hematoma measuring up to 1.1 cm in thickness with the additional subdural blood layering along the left aspect of the falx resulting in partial effacement of the left lateral ventricle and up to 5 mm rightward midline shift. Swirling hypodensity within this hematoma may reflect hyperacute blood/active bleeding. 2. Smaller more subacute appearing right cerebral convexity subdural  hematoma measuring up to 3-4 mm in thickness. 3. Trace subarachnoid hemorrhage in the left sylvian fissure. 4. No acute fracture or traumatic malalignment of the cervical spine. 5. Prominent posterior longitudinal ligament thickening and large protrusion at C3-C4 resulting in moderate to severe spinal canal stenosis with likely cord compression, similar to the CTA neck from 03/19/2018. Consider further evaluation with nonemergent cervical spine MRI as indicated. 6. Interlobular septal thickening in the lung apices suggesting pulmonary edema. Critical Value/emergent results were called by telephone at the time of interpretation on 10/28/2021 at 2:58 pm to provider Dr Estell Harpin, who verbally acknowledged these results. Electronically Signed   By: Lesia Hausen M.D.   On: 10/28/2021 15:12   DG Chest Port 1 View  Result Date: 10/28/2021 CLINICAL DATA:  Fall in a 71 year old male. EXAM: PORTABLE CHEST 1 VIEW COMPARISON:  None available FINDINGS: EKG leads project over the chest. Cardiomediastinal contours and hilar structures are normal. Lungs are clear. Assessment limited by portable technique and patient body habitus. No visible  pneumothorax. On limited assessment no acute skeletal findings. IMPRESSION: No acute cardiopulmonary disease. Assessment limited by portable technique and patient body habitus. Electronically Signed   By: Donzetta Kohut M.D.   On: 10/28/2021 14:52    Labs:  Basic Metabolic Panel:    Latest Ref Rng & Units 11/10/2021    5:36 AM 11/07/2021    6:09 AM 11/06/2021    5:45 AM  BMP  Glucose 70 - 99 mg/dL 161  096  045   BUN 8 - 23 mg/dL 21  23  20    Creatinine 0.61 - 1.24 mg/dL 4.09  8.11  9.14   Sodium 135 - 145 mmol/L 142  139  142   Potassium 3.5 - 5.1 mmol/L 4.3  4.4  4.4   Chloride 98 - 111 mmol/L 106  104  101   CO2 22 - 32 mmol/L 27  25  28    Calcium 8.9 - 10.3 mg/dL 9.3  9.2  9.6      CBC:    Latest Ref Rng & Units 11/10/2021    5:36 AM 11/05/2021    7:43 AM 10/31/2021    3:16 AM  CBC  WBC 4.0 - 10.5 K/uL 6.3  7.1  5.2   Hemoglobin 13.0 - 17.0 g/dL 78.2  95.6  21.3   Hematocrit 39.0 - 52.0 % 40.3  38.7  35.7   Platelets 150 - 400 K/uL 288  283  191      CBG: Recent Labs  Lab 11/13/21 0618 11/13/21 1146 11/13/21 1712 11/13/21 2109 11/14/21 0611  GLUCAP 105* 91 113* 111* 106*    Brief HPI:   James Dougherty is a 71 y.o. male with history of chronic BLE wounds with lymphedema, T2DM with neuropathy, PAD, recent admission for hyponatremia with cystitis who sustained a fall at tire shop then was noted to be acting spaced out at a stop sign.  He was noted to be lethargic and was found to have right-sided weakness with CT of head revealing acute left cerebral SDH with partial effacement of left lateral ventricle CT neck showed large protrusion of C3/C4 with moderate to severe canal stenosis and likely cord compression without change as well as suggestion of pulmonary edema.  He was treated with DDAVP as well as platelets.  MRI C-spine recommended as patient was noted to have hyperreflexia as well as paresthesia and reports of balance deficits.  However MRI was not done due to  severe claustrophobia.  Dr. Maisie Fus was consulted for input and felt that patient with possibly acute on chronic SDH and recommended Keppra x7 days as well as repeat CT head to monitor for stability.  Dr. Roda Shutters also consulted for input on right-sided weakness and recommended MRI as well as keeping BP at 1 30-1 50 range due to carotid artery stenosis.  Serial CT of head showed stable bleed.  Carotid Doppler showed 80 to 99% right ICA stenosis and 60 to 79% left-ICA stenosis.    Patient's blood pressure was noted to be trending upwards BP meds resumed prior to discharge.  Neurology recommended repeat CT head in 2 weeks and follow-up with neurosurgery for input on resumption of DAPT.  Wound care was also consulted for input on lower extremity dressing changes.  Therapy was working with patient who continued to be limited by dizziness, fatigue requiring frequent rest breaks, delay in processing as well as short-term memory deficits.  CIR was recommended due to functional decline.   Hospital Course: TYDUS SANMIGUEL was admitted to rehab 11/04/2021 for inpatient therapies to consist of PT and OT at least three hours five days a week. Past admission physiatrist, therapy team and rehab RN have worked together to provide customized collaborative inpatient rehab.  His blood pressures were monitored on TID basis and has been reasonably controlled on current regimen.  He was found to have infection of prior IV site in RUE antecubital fossa.  He was treated with 5-day course of doxycycline as well as manuka honey with dry dressings with good results.  Lesion is completely healed.  Right second toe open area shows clean base with mild serous drainage and was covered with Mepilex dressing during his stay.  Serial check of electrolytes during his stay showed evidence of mild AKI on 10/12.  He was encouraged to increase p.o. fluids and IV fluids were used for gentle hydration with improvement in renal status.  CT of head was  repeated prior to discharge showing some increase in hemorrhage as well as edema.  Neurosurgery was consulted and evaluated films and felt that there was no evidence of herniation.  Patient has shown improvement in his neurological status and NS recommended repeat CT head in 2 weeks with follow-up in office.  Patient and daughter have been educated on monitoring for any neurological changes and that if this occurred to seek, they are to present to ED immediately.   Diabetes has been monitored with ac/hs CBG checks and SSI was use prn for tighter BS control.  70/30 insulin was resumed at admission at 5 units daily.  Trulicity was resumed once available from home.  Ensure was changed to Ensure Max and protein supplements added  additionally to help promote wound healing.  Patient's p.o. intake has been good and is currently controlled on 5 units at bedtime.  He was advised to continue monitoring blood sugars on 4 times daily basis and follow-up with PCP for further titration and insulin.  He did report some issues with dizziness which have gradually resolved.  Vestibular evaluation not done due to history of cervical stenosis/compression.  Follow up CBC showed that ABLA has resolved. His bowel regimen has been titrated during his stay to help manage constipation. Wounds on his lower extremities have healed and blistering have has resolved.  Ace wraps were changed to TED hose for ease of care during his stay.  He was advised to continue elevation as well as compression indefinitely given history of peripheral edema as well as  recurrent wounds.  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  Right arm and combination of triamcinolone and Eucerin were used on buttocks to help with itching as well as psoriatic lesions.  He was advised on dermatology follow-up but plans on going back to using Cetaphil lotion to these lesions.  He has made steady gains during his  stay and supervision is recommended for safety.  He will continue to receive outpatient PT and OT at [facility] outpatient rehab after discharge.   Rehab course: During patient's stay in rehab weekly team conferences were held to monitor patient's progress, set goals and discuss barriers to discharge. At admission, patient required min assist with basic mobility and with ADL tasks. 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  No speech therapy needed during his stay. He  has had improvement in activity tolerance, balance, postural control as well as ability to compensate for deficits. He/She has had improvement in functional use LUE  and LLE as well as improvement in awareness.  He is able to complete ADL tasks at modified independent supervision level. He requires supervision with verbal cues for transfers and to ambulate 300 feet without assistive device.  Disposition: Home  Diet: Heart healthy/Carb Modified.  Wounds: Keep using Cetaphil cream to dry/psoriatic lesions.     Special Instructions: No driving or strenuous activity till cleared by MD. 2. May add a.m. 5 units if blood sugars running over 150 consistently and can titrate to home dose or follow-up with PCP for input. 3.  No aspirin or aspirin-containing products.  No alcohol or NSAIDs. Discharge Instructions     Ambulatory referral to Neurology   Complete by: As directed    An appointment is requested in approximately: 2-3 weeks   Ambulatory referral to Physical Medicine Rehab   Complete by: As directed       Allergies as of 11/14/2021       Reactions   Oxycodone Nausea And Vomiting   Glipizide    Bad headache and blurred vision   Bactrim [sulfamethoxazole-trimethoprim] Itching, Rash        Medication List     STOP taking these medications    aspirin EC 81 MG tablet   clopidogrel 75 MG  tablet Commonly known as: PLAVIX   nystatin powder Commonly known as: MYCOSTATIN/NYSTOP       TAKE these medications    acetaminophen 325 MG tablet Commonly known as: TYLENOL Take 2 tablets (650 mg total) by mouth 4 (four) times daily -  with meals and at bedtime.   atorvastatin 20 MG tablet Commonly known as: LIPITOR Take 1 tablet (20 mg total) by mouth at bedtime. Too soon to refill What changed: additional instructions   bisoprolol 5 MG tablet Commonly known as: ZEBETA Take 0.5 tablets (2.5 mg total) by mouth daily.   fluticasone 50 MCG/ACT nasal spray Commonly known as: FLONASE Place 2 sprays into both nostrils daily as needed for allergies.   furosemide 20 MG tablet Commonly known as: LASIX Take 1 tablet (20 mg total) by mouth daily.   gabapentin 600 MG tablet Commonly known as: NEURONTIN Take 1 tablet (600 mg total) by mouth every evening.   Gold Bond Foot Crea Apply 1 application topically 2 (two) times daily as needed (DRY SKIN). DIABETIC CREAM   losartan 25 MG tablet Commonly known  as: COZAAR Take 0.5 tablets (12.5 mg total) by mouth daily. Too soon to refill What changed: additional instructions   methocarbamol 750 MG tablet Commonly known as: ROBAXIN Take 1 tablet (750 mg total) by mouth every 8 (eight) hours.   NovoLIN 70/30 ReliOn (70-30) 100 UNIT/ML injection Generic drug: insulin NPH-regular Human Inject 16 Units into the skin every evening.   polyethylene glycol powder 17 GM/SCOOP powder Commonly known as: GLYCOLAX/MIRALAX Take 17 g by mouth daily. Notes to patient: laxative   Senexon-S 8.6-50 MG tablet Generic drug: senna-docusate Take 1 tablet by mouth 2 (two) times daily. Notes to patient: laxative   traZODone 50 MG tablet Commonly known as: DESYREL Take 0.5 tablets (25 mg total) by mouth at bedtime as needed for sleep.   triamcinolone cream 0.1 % Commonly known as: KENALOG Apply 1 Application topically 2 (two) times daily. Mix  with OTC eucerin 1:1.   Trulicity 1.5 MG/0.5ML Sopn Generic drug: Dulaglutide Inject 1.5 mg into the skin once a week.        Follow-up Information     Sasser, Clarene Critchley, MD Follow up.   Specialty: Family Medicine Why: Call in 1-2 days for post hospital follow up Contact information: [address] [place] [address] [phone]         [facility] Follow up.   Why: office will call you with follow up appointment Contact information: [address]     [address] [place] [address] [phone]        Elijah Birk C, DO Follow up.   Specialty: Physical Medicine and Rehabilitation Contact information: [address] [place] [address] [phone]         Bedelia Person, MD Follow up.   Specialty: Neurosurgery Why: Call in 1-2 days for post hospital follow up and for CT head in 2 weeks. Contact information: [address] [place] [address] [phone]                 Signed: Jacquelynn Cree 11/14/2021, 11:07 AM

## 2021-11-12 NOTE — Progress Notes (Signed)
Physical Therapy Session Note  Patient Details  Name: James Dougherty MRN: [id number] Date of Birth: Feb 25, 1950  Today's Date: 11/12/2021 PT Individual Time: 0902-1000 and 4401-0272 PT Individual Time Calculation (min): 58 min and 57 min  Short Term Goals: Week 1:  PT Short Term Goal 1 (Week 1): STG=LTG secondary to ELOS  Skilled Therapeutic Interventions/Progress Updates:     1st Session: Pt received seated in recliner and agrees to therapy. No complaint of pain. Dr. Tressa Busman present and relays to pt status of SDHs. Following, pt performs upper and lower body dressing with minA for lower body and pt performing multiple reps of sit to stand without assistance. Pt then ambulates x400' with RW and close supervision, with cues to increase proximity to RW for safety and increase upright gaze and trunk extension to improve balance and posture. Pt takes one brief standing rest break at around ~325'. Pt then performs Nustep for endurance training. PT ensure pt completing full available ROM and provides cues for body mechanics. Pt completes 2x6:00 at workload of 6 with average steps per minute ~60.  Pt ambulates x300' with RW to dayroom with same cueing. Seated rest break. Pt then ambulates through obstacle course, weaving in and out of cones, using a RW. Pt takes seated rest break and then completes without RW, with CGA. Pt takes very short bilateral stride lengths but no LOBs. PT educates on importance of lengthening strides and challenges pt to perform again with improved gait mechanics. Pt completes 2nd bout and does demonstrate improved stride lengths, but reverts to prior gait pattern for ~50% of course. Pt performs final bout on obstacle course with similar presentation.  PT demonstrates push-and-release test to pt and explains rationale for participating in activity. Pt then performs push and release test multiple times, with PT providing countdown and verbal cueing prior to release. Performed for  NMR and training longer, backward stride lengths to ensure COG stays over BOS with backward LOBs. Pt performs x10 reps and takes very short, choppy steps backward, but only has one full LOB, requiring maxA for support.  Pt ambulates back to room with RW and CGA. Left seated with alarm intact and all needs within reach.   2nd Session: Pt received seated in [facility] and agrees to therapy. No complaint of pain. Sit to stand with cues for initiation. Pt ambulates x125' to dayroom without AD, with CGA and cues to increase upright gaze to I'm prove posture and balance, as well as incresing stride length to decrease risk for falls. Seated rest breaks. Pt performs strengthening activity for lower extremities with emphasis on hip abductors. Green theraband placed around distal thighs. Pt cued to maintain isometric partial squat, with PT providing minA to facilitate optimal positioning. Pt completes x5 bouts to fatigue, typically 30 seconds to 1 minute, with seated rest breaks in between.  Pt ambulates x175' with cue to ambulate with stride lengths as long as possible. PT provides CGA and pt presents with slightly increased stride lengths bilaterally, but decreased trunk rotation and arms swing.  Pt performs alternating toe taps on 3 inch step without AD. PT provides modA for trunk stability and to facilitate lateral weight shifting. Pt completes x5 with each foot and has increased difficulty tapping step with R leg relative to L. Following seated rest break, pt completes x10 with each leg with same assistance, as well sa providing feedback to pt on posterior bias. Pt performs final bout with minA and improved weight distribution, but does have  LOB on final rep and sits abruptly back on mat.   Pt ambulates back to room, x200' without AD and with CGA, with same cueing and improving gait mechanics. Pt left seated in WC with alarm intact and all need within reach.  Therapy Documentation Precautions:   Precautions Precautions: Fall Precaution Comments: x2 recent falls Restrictions Weight Bearing Restrictions: No   Therapy/Group: Individual Therapy  Beau Fanny, PT, DPT 11/12/2021, 4:41 PM

## 2021-11-12 NOTE — Progress Notes (Signed)
PROGRESS NOTE   Subjective/Complaints: No acute complaints. No events overnight.  Called by Radiology this AM regarding repeat CT head; see results below, radiology concerned for increased L SDH with rightward midline shift, along with potential early subfalcine herniation. Patient with no new neurologic deficits on exam.   ROS: Denies fevers, chills, N/V, abdominal pain, constipation, diarrhea, SOB, cough, chest pain, new weakness or paraesthesias.  [facility] - North CampusBuMisty Stanley36[facility]70Center For Digestive Health LLConnectCentral Texas Medical CenteMisty Stanley4[facility]68Plantation General HospitaConnecticuODigestive Disease And Endoscopy Center PLLCKimberMisty Stanley4[facility]24Bergen Gastroenterology PCoLippy Surgery Center Misty StanleyCumberland [facility]99Rutland Regional Medical CenteConnecticuOdessa FleMain Line Endoscopy Center EastMiMisty Stanley59[facility]53B[facility] Memorial Hospital - ColliervillHomestead HospitalMisty Stanley3[facility]75Pipeline Westlake HospitalSt. Luke'S Hospital At The Vinta2726St VPowell Valley HospiMisty Stanley60[facility]29Sturgis HospitaConnH B Magruder Memorial HospitMisty Stanley4[facility]91The Endoscopy Center Of BristoConnecticSouth Big Horn County Critical Access HospitalMeMisty Stanley26Gottleb Co Health Services Corporation D[facility]70Logan Regional HospitaConneSummerlin Hospital Medical CenteMisty Stanley1[facility]85Palo Alto County HospitaConnecticuOdEastern Massachusetts Surgery Center LMisty Stanley15[facility]28Clinton Memorial HospitaConRmc JacksonvilMisty Stanley55Digestive Disease Center Ii64Southwest [facility] Center LLBaylor Medical Center At Trophy ClMisty Stanley74Chi St Lukes Health Memorial Lufkin30South Texas Surgical HospitRegional General Hospital WillistonHorseMisty Stanley6Y[facility]69Alvarado Hospital Medical CenteConnecticuOdessa FleminGeo81<MENorton Women'S And Kosair Children'S HospitalNew AMisty Stanley6Whiteriver [facility] Hospital42Southwest Endoscopy LtConnecticuOMercy St Theresa CenterScMisty Stanley26North[facility]-Intensive Services58Clinical Associates Pa Dba Clinical Associates AsConnecDouglas Gardens HospitMisty StanleyCoteau Des Prairies Hospital47Holy Redeemer Ambulatory Surgery Center LLConnecticuOdessa FleWillamette Valley Medical CMisty Stanley58[facility]23Regency Hospital Of CovingtoConnecticuOdessa FAurora Las Encinas Hospital, LMisty Stanley94[facility]39Ascension Eagle River Mem HsptConnecticuOdessa Christiana Care-Christiana HospitalNortMisty Stanley60The Surgical [facility]15Johnson County Health CenteCoPanola Medical CenMisty Stanley59[facility]53Essex Endoscopy Center Of Nj LLCoLa Peer Surgery Center LLCEscudilMisty Stanley31Select Speciality Hospital [facility]46Naples Day Surgery LLC Dba Naples Day SurgeryMclaren Caro RegioMisty Stanley18[place]19St. Joseph'S Behavioral Health CenteConnecticuOdessa FlemiEastern Orange Ambulatory Surgery Center LLCJuncMisty Stanley84Mid [facility] LLC40W[facility] ASummit Pacific Medical CeMisty Stanley56[facility]36Center For Digestive Diseases And Cary Endoscopy CenteConnectSt Joseph Memorial HospitaMisty Stanley3[facility]60Wills Memorial HospitaConnecticuOdePiedmont Columbus Regional MidtownMisty Stanley38[facility] Center67Methodist Hospital-SoutConneAsc Tcg LMisty Stanley1[address] Chemical Dependency Recovery Hospital27Alameda Surgery Center LConnecticuOdessa FleminGeo811ffery Lyonsa FleminGeo81 1f845Eli Lilly and [facility] MediLolly Mustach ST TECHNIQUE: Contiguous axial images were obtained from the base of the skull through the vertex without intravenous contrast. RADIATION DOSE REDUCTION: This exam was performed according to the departmental dose-optimization program which includes automated exposure control, adjustment of the mA and/or kV according to patient size and/or use of iterative reconstruction technique. COMPARISON:  10/4/23CT Brain FINDINGS: Brain: Redemonstrated left cerebral convexity subdural hematoma which has increased in size compared to prior exam now measuring up to 1.2 cm, previously 7 mm when measured in a similar orientation. The amount of hyperdense blood products within the subdural hematoma has decreased, but persists. There is interval decrease in the amount of subdural blood products seen along the falx. Compared to prior exam there is interval increase in the degree of rightward midline shift now measuring to 7 mm, previously up to 4 mm when measured in a similar orientation. Compared to prior exam there is also asymmetric effacement of the left basal cistern (series 100, image 27). There is also increased mass effect on the left lateral ventricular system. No evidence of ventricular entrapment. 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 Compared to  prior exam there is increased hypodensity along the medial aspect of the left frontal lobe with poor visualization of the gray-white junction (series 4, image 13). Vascular: No hyperdense vessel or unexpected calcification. Skull: Normal. Negative for fracture or focal lesion. Sinuses/Orbits: No acute finding. Other: None. IMPRESSION: 1. Interval increase in size of left cerebral convexity subdural hematoma with increased rightward midline shift and effacement of the basal cistern. 2. Compared to prior exam there is increased hypodensity along the medial aspect of the left frontal lobe with poor visualization of the gray-white junction. Although this may be artifactual, it could also represent an infarct in the setting of subfalcine herniation. Findings were discussed with Dr. Sharna Gabrys on 11/12/21 at 8:32 AM. Electronically Signed   By: Hemant  Desai M.D.   On: 11/12/2021 08:34   Recent Labs    11/10/21 0536  WBC 6.3  HGB 13.5  HCT 40.3  PLT 288    Recent Labs    11/10/21 0536  NA 142  K 4.3  CL 106  CO2 27  GLUCOSE 111*  BUN 21  CREATININE 1.46*  CALCIUM 9.3     Intake/Output Summary (Last 24 hours) at 11/12/2021 0922 Last data filed at 11/12/2021 0837 Gross per 24 hour  Intake 682 ml  Output 1300 ml  Net -618 ml      Pressure Injury 11/04/21 Coccyx Mid Deep Tissue Pressure Injury - Purple or maroon localized area of discolored intact skin or blood-filled blister due to damage of underlying  soft tissue from pressure and/or shear. non-blanchable purple area surrounded  (Active)  11/04/21 1634  Location: Coccyx  Location Orientation: Mid  Staging: Deep Tissue Pressure Injury - Purple or maroon localized area of discolored intact skin or blood-filled blister due to damage of underlying soft tissue from pressure and/or shear.  Wound Description (Comments): non-blanchable purple area surrounded by psoriatic redness  Present on Admission:     Physical Exam: Vital Signs Blood  pressure (!) 127/53, pulse 76, temperature 98.7 F (37.1 C), temperature source Oral, resp. rate 16, height 5\' 8"  (1.727 m), weight 103.5 kg, SpO2 96 %. Gen: no distress, normal appearing, ambulating with therapy assistance with RW HEENT: +glasses, +HOH Neck: Supple without JVD or lymphadenopathy Heart: Reg rate and rhythm. No murmurs rubs or gallops Chest: CTA bilaterally without wheezes, rales, or rhonchi; no distress Abdomen: Soft, non-tender, nondistended, bowel sounds present  Extremities: Swelling (2+ pedal edema/1+ pretibial bilaterally) - stable, wrapped in ACE and gauze. 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  Psych: Pt's affect is appropriate. 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 Mild dysarthria noted.  CN 2-12 intact, EOMI, PERRLA.  MSK: Moving all 4 limbs antigravity and against resistance - 5-/5, equal while seated, unchanged. No apparent ataxia. No new sensory deficits.        Assessment/Plan: 1. Functional deficits which require 3+ hours per day of interdisciplinary therapy in a comprehensive inpatient rehab setting. Physiatrist is providing close team supervision and 24 hour management of active medical problems listed below. Physiatrist and rehab team continue to assess barriers to discharge/monitor patient progress toward functional and medical goals  Care Tool:  Bathing    Body parts bathed by patient: Right arm, Right upper leg, Left upper leg, Left arm, Chest, Abdomen, Front perineal area, Left lower leg, Right lower leg, Buttocks   Body parts bathed by helper: Right lower leg, Left lower leg Body parts n/a: Right lower leg, Left lower leg   Bathing assist  Assist Level: Supervision/Verbal cueing     Upper Body Dressing/Undressing Upper body dressing   What is the patient wearing?: Pull over shirt    Upper body assist Assist Level: Set up assist    Lower Body Dressing/Undressing Lower body dressing      What is the patient wearing?: Pants     Lower body assist Assist for lower body dressing: Supervision/Verbal cueing     Toileting Toileting    Toileting assist Assist for toileting: Moderate Assistance - Patient 50 - 74%     Transfers Chair/bed transfer  Transfers assist     Chair/bed transfer assist level: Supervision/Verbal cueing     Locomotion Ambulation   Ambulation assist      Assist level: Minimal Assistance - Patient > 75% Assistive device: Walker-rolling Max distance: 100'   Walk 10 feet activity   Assist     Assist level: Minimal Assistance - Patient > 75% Assistive device: Walker-rolling   Walk 50 feet activity   Assist    Assist level: Minimal Assistance - Patient > 75% Assistive device: Walker-rolling    Walk 150 feet activity   Assist Walk 150 feet activity did not occur: Safety/medical concerns  Walk 10 feet on uneven surface  activity   Assist     Assist level: Minimal Assistance - Patient > 75% Assistive device: Walker-rolling   Wheelchair     Assist Is the patient using a wheelchair?: No             Wheelchair 50 feet with 2 turns activity    Assist            Wheelchair 150 feet activity     Assist          Blood pressure (!) 127/53, pulse 76, temperature 98.7 F (37.1 C), temperature source Oral, resp. rate 16, height 5\' 8"  (1.727 m), weight 103.5 kg, SpO2 96 %.    Medical Problem List and Plan: 1. 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 11/18 on call paged, awaiting call back.   2.  Antithrombotics: -DVT/anticoagulation:  Mechanical: Sequential compression devices, below knee Bilateral lower extremities             -antiplatelet therapy: --off DAPT due to SDH 3. Pain Management:  tylenol and robaxin qid effective--not taking any prns  4. Mood/Behavior/Sleep: LCSW to follow for evaluation and support.              --trazodone prn for insomnia.              -antipsychotic agents: N/A 5. Neuropsych/cognition: This patient is capable of making decisions on his own behalf. 6. Chronic lymphedema/Skin/Wound Care: Wounds treated with Aquacell and compressive wraps per wound clinic-->continue.             - Increase wrap to 1 inch below knees for edema management - reinforced 10/16, TED hose not extending far enough up leg for edema control and now with recurrent blistering            - Emla steroid cream to R elbow, gluteal psoriatic lesions. Notified nursing to remove sacral heart as note DTI. - reinforced 10/13 - improved            - RUE abscess at former IV line - start Doxy 100 mg BID for MRSA coverage x5 days. Apply manuka honey daily. - improved           - RLE 2nd toe - wound care per nursing, cover in mepilax  7. Fluids/Electrolytes/Nutrition: Monitor I/O. Check CMET in am.            - 10/13 - mild increase in  Cr, encourage PO fluids         - 10/12 - Mild AKI, encouraging PO fluids, gentle IVF 75/hr today. Recheck in AM. Cr despite IVF; however, noted baseline Cr 1.7, in setting of ongoing peripheral edema and CHF will stop IVF and f/u labs Monday        - 10/16- Cr stable 1.4; monitor  8. Seizure prophylaxis: Completed 7 day course of Keppra this am.              -Repeat head CT in 2 weeks.  9. HTN: Monitor BP  TID--trending up and cozaar 12.5mg  added on 10/10, continue  10/15: BP is soft today, flowsheet reviewed and it fluctuates, continue coreg 10. T2DM: Hgb A1c-  6.4 and well controlled on home regimen --will monitor BS ac/hs -->CBG running 110-130 at this time on SSI             --70/30 insulin resumed on 10/10. Will start at 5 units and titrated to home 16 units as indicated. Resume trulicity--ask family to bring from home if unavailable.  -- Monitor intake. Change Ensure to Ensure max and protein supplements. - 10/15-18 well controlled; continue to monitor CBGs and continue above regimen Recent Labs    11/11/21 1708 11/11/21 2142 11/12/21 0641  GLUCAP 95 130* 109*       11. CKD: Baseline SCr @ 1.7. (Hyperkalemia with higher dose of ARBs.) --Improved to 1.29 with gentle hydration 10/03-10/07.  - 10/11 Cr 1.59 on intake; encourage PO fluids today, recheck in AM             - 10/12 1 L IVF for Cr. 1.44             - 10/13 - as above, mild worsening despite IVF; in interest of limiting peripheral edema and potential complications of CHF, given baseline Cr 1.7, will encourage PO fluids and f/u Monday         - 10/16- Cr stable 1.4; monitor  12. R>L CAS: In process of work up for [employer].              --BP goal 130-150 range for adequate cerebral perfusion.     11/12/2021    7:00 AM 11/12/2021    5:00 AM 11/12/2021    4:37 AM  Vitals with BMI  Weight 228 lbs 3 oz 229 lbs 4 oz   BMI 41.9 37.90   Systolic   240  Diastolic   53  Pulse   76     13. Chronic systolic CHF/Abnormal stress test: Daily wts. Monitor for signs of overload.  --Cardiac cath 10/13 (was delayed due to renal issues) - Continue Lipitor, Zebeta, Lasix, Cozaar-->all resumed on 10/10 --Orthostatic vitals and monitor for symptoms with increase in activity.  Filed Weights   11/10/21 0504 11/12/21 0500 11/12/21 0700  Weight: 105.6 kg 104 kg 103.5 kg              - 10/12 - daily weights. Started gentle IVF 75 cc/hr today for ongoing AKI, continue lasix for LE edema.               -10/13 - Weights down 3 kg despite IVF; likely inaccurate  10/14-18: weights continue to be  down/stable  13. H/o Hyponatremia: Due to poor intake/HCTZ per 08/17 admission.              -Na has been WNL with last Na of 137 on 10/7             - NA 141 on admission; monitor  14. ABLA: Recheck CBC in am. Last HGB 11.6 15. Dizziness: Vestibular evaluation.  16. Constipation: Has not had BM for 5 days. Will give higher dose miralax today and monitor.            - 10/11 - no BM with miralax yesterday. Increased bowel regimen to daily miralax + Sennakot S 1 tab BID. Added PRN suppository if no BM by tonight .            - LBM 10/16, L BM  LOS: 8 days - 10/20 to home with daughter A FACE TO FACE EVALUATION WAS PERFORMED  Angelina Sheriff 11/12/2021, 9:22 AM

## 2021-11-12 NOTE — Plan of Care (Signed)
  Problem: RH SAFETY Goal: RH STG ADHERE TO SAFETY PRECAUTIONS W/ASSISTANCE/DEVICE Description: STG Adhere to Safety Precautions With cues Assistance/Device. Outcome: Progressing   Problem: RH PAIN MANAGEMENT Goal: RH STG PAIN MANAGED AT OR BELOW PT'S PAIN GOAL Description: < 4 with prns Outcome: Progressing   Problem: RH SKIN INTEGRITY Goal: RH STG SKIN FREE OF INFECTION/BREAKDOWN Description: Manage w min assist Outcome: Progressing

## 2021-11-13 ENCOUNTER — Other Ambulatory Visit (HOSPITAL_COMMUNITY): Payer: Self-pay

## 2021-11-13 LAB — GLUCOSE, CAPILLARY
Glucose-Capillary: 105 mg/dL — ABNORMAL HIGH (ref 70–99)
Glucose-Capillary: 91 mg/dL (ref 70–99)
Glucose-Capillary: 113 mg/dL — ABNORMAL HIGH (ref 70–99)
Glucose-Capillary: 111 mg/dL — ABNORMAL HIGH (ref 70–99)

## 2021-11-13 NOTE — Progress Notes (Signed)
Occupational Therapy Session Note  Patient Details  Name: James Dougherty MRN: [id number] Date of Birth: May 19, 1950  Today's Date: 11/13/2021 OT Individual Time: 0805-0900 OT Individual Time Calculation (min): 55 min    Short Term Goals: Week 1:  OT Short Term Goal 1 (Week 1): STG=LTG d/t ELOS  Skilled Therapeutic Interventions/Progress Updates:  Pt received alseep seated in recliner, pt easily able to arouse and agreeable to OT intervention requesting to take a shower. Pt ambulated into bathroom with MIN A with no AD. Covered BLEs dressings with pt completing bathing from shower level with supervision- CGA as pt impulsively standing up from shower seat even though therapist asked pt to stay seated d/t pt having bilateral bags on his feets. Pt then reports ugrent need to have BM, pt impulsively completed stand pivot to toilet with MIN A with bags on his feet despite education on waiting until bags were doffed for safety. Pt with + BM able to complete 3/3 toileting tasks with set- up assist.  Pt ambulates to w/c with MIN A with no AD for dressing, set- up overall for UB/LB dressing, pt needed total A to don socks from w/c.  Pt transported to day room in w/c with total A to work on DC assessments as indicated below:  [employer] is used to measure finger dexterity in pts with various neurological diagnoses. - Instructions The pt was instructed to pick up the pegs one at a time, using their dominant hand first and put them into the holes in any order until the holes were all filled. The pt then removed the pegs one at a time and returned them to the container. Both hands were tested separately.  - Results The pt completed the test in 44 seconds on L side, 32 seconds on R side. Scores are based on the time taken to complete the activity. The timer started the moment the pt touched the first peg until the moment the last peg hit the container.  - Norms for healthy females ages  71-70+ 31-55 R 17.38 L 18.92 56-60 R 17.86 L 19.48 61-65 R 18.99 L 20.33 66-70 R 19.90 L 21.44 71+ R 22.49 L 24.11 Pt transported back to room in w/c with total A with pt left up in recliner with alarm belt activated and all needs within reach.    Therapy Documentation Precautions:  Precautions Precautions: Fall Precaution Comments: x2 recent falls Restrictions Weight Bearing Restrictions: No  Pain: No pain   Vision Baseline Vision/History: 1 Wears glasses Patient Visual Report: Blurring of vision Vision Assessment?: No apparent visual deficits Perception  Perception: Within Functional Limits Praxis Praxis: Intact Cognition Cognition Overall Cognitive Status: Impaired/Different from baseline Arousal/Alertness: Awake/alert Orientation Level: Person;Place;Situation Person: Oriented Place: Oriented Situation: Oriented Memory: Appears intact Attention: Focused;Sustained Focused Attention: Appears intact Sustained Attention: Appears intact Awareness: Impaired Awareness Impairment: Emergent impairment Problem Solving: Impaired Problem Solving Impairment: Functional complex Safety/Judgment: Impaired [employer] Scales of Cognitive Functioning: Purposeful, Appropriate Brief Interview for Mental Status (BIMS) Repetition of Three Words (First Attempt): 3 Temporal Orientation: Year: Correct Temporal Orientation: Month: Accurate within 5 days Temporal Orientation: Day: Correct Recall: "Sock": Yes, no cue required Recall: "Blue": Yes, no cue required Recall: "Bed": Yes, no cue required BIMS Summary Score: 15 Sensation Sensation Light Touch: Appears Intact Hot/Cold: Appears Intact Proprioception: Appears Intact Stereognosis: Not tested Coordination Gross Motor Movements are Fluid and Coordinated: Yes Fine Motor Movements are Fluid and Coordinated: Yes 9 Hole Peg Test: LUE- 44  secs RUE- 32 secs   Therapy/Group: Individual Therapy  Precious Haws 11/13/2021, 12:18 PM

## 2021-11-13 NOTE — Progress Notes (Signed)
Physical Therapy Session Note  Patient Details  Name: James Dougherty MRN: [id number] Date of Birth: January 21, 1951  Today's Date: 11/13/2021 PT Individual Time: 5597-4163 PT Individual Time Calculation (min): 32 min   Short Term Goals: Week 1:  PT Short Term Goal 1 (Week 1): STG=LTG secondary to ELOS  Skilled Therapeutic Interventions/Progress Updates:   Received pt sitting in recliner, pt agreeable to PT treatment, and denied any pain during session. Session with emphasis on functional mobility/transfers, generalized strengthening and endurance, NMR, and gait training. Pt stood without AD and close supervision and ambulated 150ft x 2 trials without AD and CGA to/from dayroom - cues to increase arm swing and stride length. Pt performed BUE/LE strengthening on Nustep at workload 6 for 5 minutes for a total of 296 steps with emphasis on cardiovascular endurance. Pt then performed TUG without AD and CGA with average of 13.6 seconds. Educated pt on test results/significance indicating fall risk. Trial 1: 14 seconds Trial 2: 14 seconds  Trial 3: 13 seconds Worked on blocked practice sit<>stands on Airex 2x10 reps with min A for balance. Pt with poor eccentric control, frequently plopping onto mat and occasionally required 2 attempts to get completley upright - cues for upright gaze/posture. Transitioned to alternating toe taps to 6in step with min HHA 3x20 reps with emphasis on dynamic balance. Returned to room and concluded session with pt sitting in recliner, needs within reach, and seatbelt alarm on.   Therapy Documentation Precautions:  Precautions Precautions: Fall Precaution Comments: x2 recent falls Restrictions Weight Bearing Restrictions: No  Therapy/Group: Individual Therapy [place] PT, DPT  11/13/2021, 2:20 PM

## 2021-11-13 NOTE — Progress Notes (Signed)
PROGRESS NOTE   Subjective/Complaints: No acute complaints. No events overnight.  Discussed Neurosurgery recs for OP f/u regarding yesterday's imaging. All questions answered, pt states they have already contacted his daughter regarding f/u imaging.   ROS: Denies fevers, chills, N/V, abdominal pain, constipation, diarrhea, SOB, cough, chest pain, new weakness or paraesthesias.    Objective:   CT HEAD WO CONTRAST ( )  Result Date: 11/12/2021 CLINICAL DATA:  Subdural hematoma follow-up EXAM: CT HEAD WITHOUT CONTRAST TECHNIQUE: Contiguous axial images were obtained from the base of the skull through the vertex without intravenous contrast. RADIATION DOSE REDUCTION: This exam was performed according to the departmental dose-optimization program which includes automated exposure control, adjustment of the mA and/or kV according to patient size and/or use of iterative reconstruction technique. COMPARISON:  10/4/23CT Brain FINDINGS: Brain: Redemonstrated left cerebral convexity subdural hematoma which has increased in size compared to prior exam now measuring up to 1.2 cm, previously 7 mm when measured in a similar orientation. The amount of hyperdense blood products within the subdural hematoma has decreased, but persists. There is interval decrease in the amount of subdural blood products seen along the falx. Compared to prior exam there is interval increase in the degree of rightward midline shift now measuring to 7 mm, previously up to 4 mm when measured in a similar orientation. Compared to prior exam there is also asymmetric effacement of the left basal cistern (series 100, image 27). There is also increased mass effect on the left lateral ventricular system. No evidence of ventricular entrapment. 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 Compared to prior exam there is increased hypodensity along the medial aspect of the left  frontal lobe with poor visualization of the gray-white junction (series 4, image 13). Vascular: No hyperdense vessel or unexpected calcification. Skull: Normal. Negative for fracture or focal lesion. Sinuses/Orbits: No acute finding. Other: None. IMPRESSION: 1. Interval increase in size of left cerebral convexity subdural hematoma with increased rightward midline shift and effacement of the basal cistern. 2. Compared to prior exam there is increased hypodensity along the medial aspect of the left frontal lobe with poor visualization of the gray-white junction. Although this may be artifactual, it could also represent an infarct in the setting of subfalcine herniation. Findings were discussed with Dr. Shearon Stalls on 11/12/21 at 8:32 AM. Electronically Signed   By: Lorenza Cambridge M.D.   On: 11/12/2021 08:34   No results for input(s): "WBC", "HGB", "HCT", "PLT" in the last 72 hours.  No results for input(s): "NA", "K", "CL", "CO2", "GLUCOSE", "BUN", "CREATININE", "CALCIUM" in the last 72 hours.   Intake/Output Summary (Last 24 hours) at 11/13/2021 1010 Last data filed at 11/13/2021 0907 Gross per 24 hour  Intake 720 ml  Output 1800 ml  Net -1080 ml      Pressure Injury 11/04/21 Coccyx Mid Deep Tissue Pressure Injury - Purple or maroon localized area of discolored intact skin or blood-filled blister due to damage of underlying soft tissue from pressure and/or shear. non-blanchable purple area surrounded  (Active)  11/04/21 1634  Location: Coccyx  Location Orientation: Mid  Staging: Deep Tissue Pressure Injury - Purple or maroon localized area of discolored intact  skin or blood-filled blister due to damage of underlying soft tissue from pressure and/or shear.  Wound Description (Comments): non-blanchable purple area surrounded by psoriatic redness  Present on Admission:     Physical Exam: Vital Signs Blood pressure (!) 143/62, pulse 70, temperature 98.2 F (36.8 C), resp. rate 16, height 5\' 8"  (1.727  m), weight 103.5 kg, SpO2 100 %. Gen: no distress, normal appearing, ambulating with therapy assistance with RW HEENT: +glasses, +HOH Neck: Supple without JVD or lymphadenopathy Heart: Reg rate and rhythm. No murmurs rubs or gallops Chest: CTA bilaterally without wheezes, rales, or rhonchi; no distress Abdomen: Soft, non-tender, nondistended, bowel sounds present  Extremities: Swelling (2+ pedal edema/1+ pretibial bilaterally) - stable, wrapped in ACE and gauze. 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  Psych: Pt's affect is appropriate. 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 Mild dysarthria noted.  CN 2-12 intact, EOMI, PERRLA.  MSK: Moving all 4 limbs antigravity and against resistance - 5-/5, equal while seated, unchanged.        Assessment/Plan: 1. Functional deficits which require 3+ hours per day of interdisciplinary therapy in a comprehensive inpatient rehab setting. Physiatrist is providing close team supervision and 24 hour management of active medical problems listed below. Physiatrist and rehab team continue to assess barriers to discharge/monitor patient progress toward functional and medical goals  Care Tool:  Bathing    Body parts bathed by patient: Right arm, Right upper leg, Left upper leg, Left arm, Chest, Abdomen, Front perineal area, Left lower leg, Right lower leg, Buttocks   Body parts bathed by helper: Right lower leg, Left lower leg Body parts n/a: Right lower leg, Left lower leg   Bathing assist Assist Level: Supervision/Verbal cueing     Upper Body Dressing/Undressing Upper body dressing   What is the patient wearing?: Pull over shirt    Upper body assist Assist Level: Set up assist     Lower Body Dressing/Undressing Lower body dressing      What is the patient wearing?: Pants     Lower body assist Assist for lower body dressing: Supervision/Verbal cueing     Toileting Toileting    Toileting assist Assist for toileting: Moderate Assistance - Patient 50 - 74%     Transfers Chair/bed transfer  Transfers assist     Chair/bed transfer assist level: Supervision/Verbal cueing     Locomotion Ambulation   Ambulation assist      Assist level: Minimal Assistance - Patient > 75% Assistive device: Walker-rolling Max distance: 100'   Walk 10 feet activity   Assist     Assist level: Minimal Assistance - Patient > 75% Assistive device: Walker-rolling   Walk 50 feet activity   Assist    Assist level: Minimal Assistance - Patient > 75% Assistive device: Walker-rolling    Walk 150 feet activity   Assist Walk 150 feet activity did not occur: Safety/medical concerns         Walk 10 feet on uneven surface  activity   Assist     Assist level: Minimal Assistance - Patient > 75% Assistive device: Walker-rolling   Wheelchair     Assist Is the patient using a wheelchair?: No  Wheelchair 50 feet with 2 turns activity    Assist            Wheelchair 150 feet activity     Assist          Blood pressure (!) 143/62, pulse 70, temperature 98.2 F (36.8 C), resp. rate 16, height 5\' 8"  (1.727 m), weight 103.5 kg, SpO2 100 %.    Medical Problem List and Plan: 1. 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 11/18 reviewed, no concern for emergent intervention w/ no neurologic changes, will f/u OP with repeat imaging  2.  Antithrombotics: -DVT/anticoagulation:  Mechanical: Sequential compression  devices, below knee Bilateral lower extremities             -antiplatelet therapy: --off DAPT due to SDH 3. Pain Management:  tylenol and robaxin qid effective--not taking any prns  4. Mood/Behavior/Sleep: LCSW to follow for evaluation and support.              --trazodone prn for insomnia.              -antipsychotic agents: N/A 5. Neuropsych/cognition: This patient is capable of making decisions on his own behalf. 6. Chronic lymphedema/Skin/Wound Care: Wounds treated with Aquacell and compressive wraps per wound clinic-->continue.             - Increase wrap to 1 inch below knees for edema management - reinforced 10/16, TED hose not extending far enough up leg for edema control and now with recurrent blistering            - Emla steroid cream to R elbow, gluteal psoriatic lesions. Notified nursing to remove sacral heart as note DTI. - reinforced 10/13 - improved            - RUE abscess at former IV line - start Doxy 100 mg BID for MRSA coverage x5 days. Apply manuka honey daily. - improved           - RLE 2nd toe - wound care per nursing, cover in mepilax  7. Fluids/Electrolytes/Nutrition: Monitor I/O. Check CMET in am.            - 10/13 - mild increase in  Cr, encourage PO fluids         - 10/12 - Mild AKI, encouraging PO fluids, gentle IVF 75/hr today. Recheck in AM. Cr despite IVF; however, noted baseline Cr 1.7, in setting of ongoing peripheral edema and CHF will stop IVF and f/u labs Monday        - 10/16- Cr stable 1.4; monitor  8. Seizure prophylaxis: Completed 7 day course of Keppra this am.              -Repeat head CT in 2 weeks.  9. HTN: Monitor BP TID--trending up and cozaar 12.5mg  added on 10/10, continue  10/15: BP is soft today, flowsheet reviewed and it fluctuates, continue coreg 10. T2DM: Hgb A1c- 6.4 and well controlled on home regimen --will monitor BS ac/hs -->CBG running 110-130 at this time on SSI             --  70/30 insulin resumed on 10/10. Will start at 5 units  and titrated to home 16 units as indicated. Resume trulicity--ask family to bring from home if unavailable.  -- Monitor intake. Change Ensure to Ensure max and protein supplements. - 10/15-18 well controlled; continue to monitor CBGs and continue above regimen Recent Labs    11/12/21 1703 11/12/21 2123 11/13/21 0618  GLUCAP 124* 96 105*       11. CKD: Baseline SCr @ 1.7. (Hyperkalemia with higher dose of ARBs.) --Improved to 1.29 with gentle hydration 10/03-10/07.  - 10/11 Cr 1.59 on intake; encourage PO fluids today, recheck in AM             - 10/12 1 L IVF for Cr. 1.44             - 10/13 - as above, mild worsening despite IVF; in interest of limiting peripheral edema and potential complications of CHF, given baseline Cr 1.7, will encourage PO fluids and f/u Monday         - 10/16- Cr stable 1.4; monitor  12. 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              --BP goal 130-150 range for adequate cerebral perfusion.     11/13/2021    5:25 AM 11/12/2021    8:21 PM 11/12/2021    1:12 PM  Vitals with BMI  Weight 228 lbs 3 oz    BMI 34.7    Systolic 143 134 166  Diastolic 62 51 45  Pulse 70 65 66     13. Chronic systolic CHF/Abnormal stress test: Daily wts. Monitor for signs of overload.  --Cardiac cath 10/13 (was delayed due to renal issues) - Continue Lipitor, Zebeta, Lasix, Cozaar-->all resumed on 10/10 --Orthostatic vitals and monitor for symptoms with increase in activity.  Filed Weights   11/12/21 0500 11/12/21 0700 11/13/21 0525  Weight: 104 kg 103.5 kg 103.5 kg              - 10/12 - daily weights. Started gentle IVF 75 cc/hr today for ongoing AKI, continue lasix for LE edema.               -10/13 - Weights down 3 kg despite IVF; likely inaccurate  10/14-19: weights continue to be down/stable  13. H/o Hyponatremia: Due to poor intake/HCTZ per 08/17 admission.              -Na has been WNL with last Na of 137 on 10/7             - NA 141 on admission;  monitor  14. ABLA: Recheck CBC in on admission. Last HGB 11.6 15. Dizziness: Vestibular evaluation - cancelled d/t concern for recent neck Sx. No further symptoms.  16. Constipation: Has not had BM for 5 days. Will give higher dose miralax today and monitor.            - 10/11 - no BM with miralax yesterday. Increased bowel regimen to daily miralax + Sennakot S 1 tab BID. Added PRN suppository if no BM by tonight .            - LBM 10/16, L   LOS: 9 days - 10/20 to home with daughter A FACE TO FACE EVALUATION WAS PERFORMED  Angelina Sheriff 11/13/2021, 10:10 AM

## 2021-11-13 NOTE — TOC Transition Note (Signed)
Eight (8) meds from [place], stored in [place] until discharge.

## 2021-11-13 NOTE — Progress Notes (Signed)
Physical Therapy Session Note  Patient Details  Name: FRAZER RAINVILLE MRN: [id number] Date of Birth: 1950/08/22  Today's Date: 11/13/2021 PT Individual Time: 1002-1100 and 1302-1359 PT Individual Time Calculation (min): 58 min and 57 min  Short Term Goals: Week 1:  PT Short Term Goal 1 (Week 1): STG=LTG secondary to ELOS  Skilled Therapeutic Interventions/Progress Updates:     1st Session: Pt received seated in recliner and agrees to therapy. No complaint of pain. Pt performs sit to stand with cues for initiation. Pt ambulates x200' to gym without AD, with cues for upright gaze to improve posture and balance, and increasing bilateral stride length to decrease risk for falls. Following seated rest break, pt performs [employer], as detailed below. PT provides interpretation of results for pt and advises use of RW for safety. Pt then performs TUG test, without RW, with following scores: 13.5, 11.8, and 9.5 seconds. Both Berg and Tug scores exceed the MCID from previous assessments. Pt ambulates back to room without AD, with close supervision and same cueing. Left seated in recliner with alarm intact and all needs within reach.  2nd Session: Pt received seated in [facility] and agrees to therapy. No complaint of pain. Sit to stand with cues for initiation. Pt ambulates to dayroom without AD, with cues to increase trunk rotation and arm swing to decrease risk for falls. Pt completes Wii bowling activity to challenge dynamic balance while performing large amplitude, multiplanar movements. Pt requires x1 seated rest break during activity and PT provides close supervision, as pt's COG has posterior bias, but he does not have any LOBs. Following, pt ambulates to dayroom and has seated rest break. Pt ambulates x60' to steps and completes x12 6" steps with bilateral hand rails with close supervision, with cues for step sequencing. Performed for functional strengthening and mobility training. Following seated  rest break, pt completes additional stair training ambulating up 3" steps and down 6" steps, initially with bilateral upper extremities, then with R upper extremity support, then with L upper extremity support. PT provides minA when descending steps with single upper extremity support. Pt completes [employer] percent weight bearing training. Pt initially has 75% of weight in heels. Without upper extremity support, pt attempts to achieve symmetrical anterior-posterior WB. PT provides cueing to utilize hip strategy rather than using trunk for weight shift. Pt takes seated rest break. Pt ambulates x250' back to room with close supervision and same cues. Left seated in recliner with alarm intact and all needs within reach.   Therapy Documentation Precautions:  Precautions Precautions: Fall Precaution Comments: x2 recent falls Restrictions Weight Bearing Restrictions: No Balance: Balance Balance Assessed: Yes Standardized Balance Assessment Standardized Balance Assessment: Berg Balance Test Berg Balance Test Sit to Stand: Able to stand without using hands and stabilize independently Standing Unsupported: Able to stand 2 minutes with supervision Sitting with Back Unsupported but Feet Supported on Floor or Stool: Able to sit safely and securely 2 minutes Stand to Sit: Controls descent by using hands Transfers: Able to transfer safely, definite need of hands Standing Unsupported with Eyes Closed: Able to stand 10 seconds with supervision Standing Ubsupported with Feet Together: Able to place feet together independently and stand for 1 minute with supervision From Standing, Reach Forward with Outstretched Arm: Reaches forward but needs supervision From Standing Position, Pick up Object from Floor: Able to pick up shoe, needs supervision From Standing Position, Turn to Look Behind Over each Shoulder: Needs supervision when turning Turn 360 Degrees: Needs close  supervision or verbal cueing Standing  Unsupported, Alternately Place Feet on Step/Stool: Able to stand independently and complete 8 steps >20 seconds Standing Unsupported, One Foot in Front: Able to plae foot ahead of the other independently and hold 30 seconds Standing on One Leg: Tries to lift leg/unable to hold 3 seconds but remains standing independently Total Score: 36 Timed Up and Go Test TUG: Normal TUG Normal TUG (seconds): 11.6 Static Sitting Balance Static Sitting - Balance Support: Feet supported Static Sitting - Level of Assistance: 7: Independent Dynamic Sitting Balance Dynamic Sitting - Balance Support: Feet unsupported Dynamic Sitting - Level of Assistance: 5: Stand by assistance Static Standing Balance Static Standing - Balance Support: During functional activity;No upper extremity supported Static Standing - Level of Assistance: 5: Stand by assistance Dynamic Standing Balance Dynamic Standing - Balance Support: During functional activity;No upper extremity supported Dynamic Standing - Level of Assistance: 5: Stand by assistance    Therapy/Group: Individual Therapy  Beau Fanny, PT, DPT 11/13/2021, 5:13 PM

## 2021-11-13 NOTE — Progress Notes (Signed)
Removed pt;'s ACE bandages and kerlix. Cleansed with soap and water and dried. Placed new xeroform over healed blisters on left anterior legs. Then placed Kerlix and ACE bandages over bilateral legs.

## 2021-11-14 LAB — GLUCOSE, CAPILLARY
Glucose-Capillary: 106 mg/dL — ABNORMAL HIGH (ref 70–99)
Glucose-Capillary: 87 mg/dL (ref 70–99)

## 2021-11-14 NOTE — Progress Notes (Signed)
Occupational Therapy Discharge Summary  Patient Details  Name: James Dougherty MRN: [id number] Date of Birth: January 22, 1951  Date of Discharge from [place] 20, 2023  Today's Date: 11/14/2021 OT Individual Time: 1005-1100 OT Individual Time Calculation (min): 55 min   Pt greeted seated in recliner and agreeable to OT treatment session. Discussed with nursing TED hose vs. Ace wraps. Pt's wound is almost completely healed. Nursing placed foam dressing, then OT had pt don his own TED hose mod I. Educated on importance of maintaining compression for circulatory issues. Daughter entered for family education.Education provided regarding safe BADL performance in home environment, home modifications, DME needs, energy conservation techniques, and safety awareness.  Pt brought to tub room and practiced step over tub transfer with use of grab bars and also with tub transfer bench. Pt returned to room and left with daughter ready for next PT session.    Patient has met 11 of 11 long term goals due to improved activity tolerance, improved balance, postural control, ability to compensate for deficits, functional use of  LEFT upper and LEFT lower extremity, improved attention, improved awareness, and improved coordination.  Patient to discharge at overall Mod I/Supervision level.  Patient's care partner is independent to provide the necessary physical assistance at discharge.    Reasons goals not met: n/a  Recommendation:  Patient will benefit from ongoing skilled OT services in outpatient setting to continue to advance functional skills in the area of BADL and functional use of L UE, balance .  Equipment: Family to purchase tub transfer bench  Reasons for discharge: treatment goals met and discharge from hospital  Patient/family agrees with progress made and goals achieved: Yes  OT Discharge  Pain  Denies pain ADL ADL Eating: Modified independent Where Assessed-Eating: Edge of  bed Grooming: Modified independent Where Assessed-Grooming: Standing at sink Upper Body Bathing: Modified independent Where Assessed-Upper Body Bathing: Shower Lower Body Bathing: Supervision/safety Where Assessed-Lower Body Bathing: Shower Upper Body Dressing: Independent Where Assessed-Upper Body Dressing: Chair Lower Body Dressing: Modified independent Where Assessed-Lower Body Dressing: Chair Toileting: Modified independent Where Assessed-Toileting: Glass blower/designer: Diplomatic Services operational officer Method: Counselling psychologist: Energy manager: Close supervision Social research officer, government Method: Heritage manager: Radio broadcast assistant, Estate agent: Within Financial controller Praxis: Intact Cognition Cognition Arousal/Alertness: Awake/alert Orientation Level: Person;Place;Situation Person: Oriented Place: Oriented Situation: Oriented Memory: Appears intact Safety/Judgment: Appears intact [employer] Scales of Cognitive Functioning: Purposeful, Appropriate Brief Interview for Mental Status (BIMS) Repetition of Three Words (First Attempt): 3 Temporal Orientation: Year: Correct Temporal Orientation: Month: Accurate within 5 days Temporal Orientation: Day: Correct Recall: "Sock": Yes, no cue required Recall: "Blue": Yes, no cue required Recall: "Bed": Yes, no cue required BIMS Summary Score: 15 Sensation Sensation Light Touch: Appears Intact Hot/Cold: Appears Intact  Balance Static Sitting Balance Static Sitting - Balance Support: Feet supported Static Sitting - Level of Assistance: 7: Independent Dynamic Sitting Balance Dynamic Sitting - Balance Support: Feet supported Dynamic Sitting - Level of Assistance: 6: Modified independent (Device/Increase time) Static Standing Balance Static Standing - Balance Support: During functional activity;No upper extremity  supported Static Standing - Level of Assistance: 6: Modified independent (Device/Increase time) Dynamic Standing Balance Dynamic Standing - Balance Support: During functional activity;No upper extremity supported Dynamic Standing - Level of Assistance: 5: Stand by assistance Extremity/Trunk Assessment RUE Assessment RUE Assessment: Within Functional Limits LUE Assessment LUE Assessment: Within Functional Limits   Daneen Schick  Parminder Cupples 11/14/2021, 11:49 AM

## 2021-11-14 NOTE — Progress Notes (Signed)
PROGRESS NOTE   Subjective/Complaints: No acute complaints. No events overnight.  Pt asks if he will need to follow up with wound clinic after discharge. While leg blistering has resolved, did encourage him to follow up given recurrent wounds. Pt asks when he can stop applying compression to his legs; informed him with ACE wrap or TEDs will be needed indefinitely given peripheral edema and recurrent wounds.  Finally, patient notes R 2nd toe dressing now removed for several days.   ROS: Denies fevers, chills, N/V, abdominal pain, constipation, diarrhea, SOB, cough, chest pain, new weakness or paraesthesias.    Objective:   No results found. No results for input(s): "WBC", "HGB", "HCT", "PLT" in the last 72 hours.  No results for input(s): "NA", "K", "CL", "CO2", "GLUCOSE", "BUN", "CREATININE", "CALCIUM" in the last 72 hours.   Intake/Output Summary (Last 24 hours) at 11/14/2021 1245 Last data filed at 11/14/2021 0900 Gross per 24 hour  Intake 720 ml  Output 1600 ml  Net -880 ml      Pressure Injury 11/04/21 Coccyx Mid Deep Tissue Pressure Injury - Purple or maroon localized area of discolored intact skin or blood-filled blister due to damage of underlying soft tissue from pressure and/or shear. non-blanchable purple area surrounded  (Active)  11/04/21 1634  Location: Coccyx  Location Orientation: Mid  Staging: Deep Tissue Pressure Injury - Purple or maroon localized area of discolored intact skin or blood-filled blister due to damage of underlying soft tissue from pressure and/or shear.  Wound Description (Comments): non-blanchable purple area surrounded by psoriatic redness  Present on Admission:     Physical Exam: Vital Signs Blood pressure (!) 139/56, pulse 67, temperature 98.7 F (37.1 C), resp. rate 18, height 5\' 8"  (1.727 m), weight 101.2 kg, SpO2 99 %. Gen: no distress, normal appearing, ambulating with therapy  assistance with RW HEENT: +glasses, +HOH Neck: Supple without JVD or lymphadenopathy Heart: Reg rate and rhythm. No murmurs rubs or gallops Chest: CTA bilaterally without wheezes, rales, or rhonchi; no distress Abdomen: Soft, non-tender, nondistended, bowel sounds present  Extremities: Swelling (2+ pedal edema/1+ pretibial bilaterally) - stable, wrapped in ACE and gauze. 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  Psych: Pt's affect is appropriate. 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 Mild dysarthria noted.  CN 2-12 intact, EOMI, PERRLA.  MSK: Moving all 4 limbs 5-/5      Assessment/Plan: 1. Functional deficits which require 3+ hours per day of interdisciplinary therapy in a comprehensive inpatient rehab setting. Physiatrist is providing close team supervision and 24 hour management of active medical problems listed below. Physiatrist and rehab team continue to assess barriers to discharge/monitor patient progress toward functional and medical goals  Care Tool:  Bathing    Body parts bathed by patient: Right arm, Right upper leg, Left upper leg, Left arm, Chest, Abdomen, Front perineal area, Left lower leg, Right lower leg, Buttocks, Face   Body parts bathed by helper: Right  lower leg, Left lower leg Body parts n/a: Right lower leg, Left lower leg   Bathing assist Assist Level: Supervision/Verbal cueing     Upper Body Dressing/Undressing Upper body dressing   What is the patient wearing?: Pull over shirt    Upper body assist Assist Level: Independent with assistive device    Lower Body Dressing/Undressing Lower body dressing      What is the patient wearing?: Pants     Lower body assist Assist for lower body dressing: Supervision/Verbal cueing      Toileting Toileting    Toileting assist Assist for toileting: Supervision/Verbal cueing     Transfers Chair/bed transfer  Transfers assist     Chair/bed transfer assist level: Independent with assistive device     Locomotion Ambulation   Ambulation assist      Assist level: Contact Guard/Touching assist Assistive device: No Device Max distance: 154ft   Walk 10 feet activity   Assist     Assist level: Contact Guard/Touching assist Assistive device: No Device   Walk 50 feet activity   Assist    Assist level: Contact Guard/Touching assist Assistive device: No Device    Walk 150 feet activity   Assist Walk 150 feet activity did not occur: Safety/medical concerns         Walk 10 feet on uneven surface  activity   Assist     Assist level: Minimal Assistance - Patient > 75% Assistive device: Walker-rolling   Wheelchair     Assist Is the patient using a wheelchair?: No             Wheelchair 50 feet with 2 turns activity    Assist            Wheelchair 150 feet activity     Assist          Blood pressure (!) 139/56, pulse 67, temperature 98.7 F (37.1 C), resp. rate 18, height 5\' 8"  (1.727 m), weight 101.2 kg, SpO2 99 %.    Medical Problem List and Plan: 1. 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 Marcello Moores reviewed, no concern for emergent intervention w/ no neurologic changes, will f/u OP with repeat imaging. Per patient, neurosurgery called daughter already with plan.   2.  Antithrombotics: -DVT/anticoagulation:  Mechanical: Sequential compression devices, below knee Bilateral lower extremities             -antiplatelet therapy: --off DAPT due to SDH 3. Pain Management:  tylenol and  robaxin qid effective--not taking any prns  4. Mood/Behavior/Sleep: LCSW to follow for evaluation and support.              --trazodone prn for insomnia.              -antipsychotic agents: N/A 5. Neuropsych/cognition: This patient is capable of making decisions on his own behalf. 6. Chronic lymphedema/Skin/Wound Care: Wounds treated with Aquacell and compressive wraps per wound clinic-->continue.             - Increase wrap to 1 inch below knees  for edema management - reinforced 10/16, TED hose not extending far enough up leg for edema control and now with recurrent blistering            - Emla steroid cream to R elbow, gluteal psoriatic lesions. Notified nursing to remove sacral heart as note DTI. - reinforced 10/13 - improved            - RUE abscess at former IV line - start Doxy 100 mg BID for MRSA coverage x5 days. Apply manuka honey daily. - improved           - RLE 2nd toe - wound care per nursing, cover in mepilax          - Advised patient to continue wound clinic follow ups on discharge given recurrent wounds, edema  7. Fluids/Electrolytes/Nutrition: Monitor I/O. Check CMET in am.            - 10/13 - mild increase in  Cr, encourage PO fluids         - 10/12 - Mild AKI, encouraging PO fluids, gentle IVF 75/hr today. Recheck in AM. Cr despite IVF; however, noted baseline Cr 1.7, in setting of ongoing peripheral edema and CHF will stop IVF and f/u labs Monday        - 10/16- Cr stable 1.4; monitor  8. Seizure prophylaxis: Completed 7 day course of Keppra this am.              -Repeat head CT in 2 weeks.  9. HTN: Monitor BP TID--trending up and cozaar 12.5mg  added on 10/10, continue  10/15: BP is soft today, flowsheet reviewed and it fluctuates, continue coreg  10. T2DM: Hgb A1c- 6.4 and well controlled on home regimen --will monitor BS ac/hs -->CBG running 110-130 at this time on SSI             --70/30 insulin resumed on 10/10. Will start at 5 units and titrated to home 16 units as  indicated. Resume trulicity--ask family to bring from home if unavailable.  -- Monitor intake. Change Ensure to Ensure max and protein supplements. - 10/15-18 well controlled; continue to monitor CBGs and continue above regimen Recent Labs    11/13/21 2109 11/14/21 0611 11/14/21 1213  GLUCAP 111* 106* 87       11. CKD: Baseline SCr @ 1.7. (Hyperkalemia with higher dose of ARBs.) --Improved to 1.29 with gentle hydration 10/03-10/07.  - 10/11 Cr 1.59 on intake; encourage PO fluids today, recheck in AM             - 10/12 1 L IVF for Cr. 1.44             - 10/13 - as above, mild worsening despite IVF; in interest of limiting peripheral edema and potential complications of CHF, given baseline Cr 1.7, will encourage PO fluids and f/u Monday         - 10/16- Cr stable 1.4; monitor  12. R>L CAS: In process of work up for [employer].              --BP goal 130-150 range for adequate cerebral perfusion.     11/14/2021    9:00 AM 11/14/2021    4:16 AM 11/13/2021    7:56 PM  Vitals with BMI  Weight   223 lbs 2 oz  BMI   [id number]  Systolic [id number]  Diastolic 56 56 58  Pulse 67 76 68     13. Chronic systolic CHF/Abnormal  stress test: Daily wts. Monitor for signs of overload.  --Cardiac cath 10/13 (was delayed due to renal issues) - Continue Lipitor, Zebeta, Lasix, Cozaar-->all resumed on 10/10 --Orthostatic vitals and monitor for symptoms with increase in activity.  Filed Weights   11/12/21 0700 11/13/21 0525 11/13/21 1956  Weight: 103.5 kg 103.5 kg 101.2 kg              - 10/12 - daily weights. Started gentle IVF 75 cc/hr today for ongoing AKI, continue lasix for LE edema.               -10/13 - Weights down 3 kg despite IVF; likely inaccurate  10/14-20: weights continue to be down/stable  13. H/o Hyponatremia: Due to poor intake/HCTZ per 08/17 admission.              -Na has been WNL with last Na of 137 on 10/7             - NA 141 on admission; monitor  14. ABLA: Recheck CBC in on  admission. Last HGB 11.6 15. Dizziness: Vestibular evaluation - cancelled d/t concern for recent neck Sx. No further symptoms.  16. Constipation: resolved           - 10/11 - no BM with miralax yesterday. Increased bowel regimen to daily miralax + Sennakot S 1 tab BID. Added PRN suppository if no BM by tonight .            - LBM 10/20  LOS: 10 days - 10/20 to home with daughter A FACE TO FACE EVALUATION WAS PERFORMED  Gertie Gowda 11/14/2021, 12:45 PM

## 2021-11-14 NOTE — Plan of Care (Signed)
  Problem: RH Balance Goal: LTG: Patient will maintain dynamic sitting balance (OT) Description: LTG:  Patient will maintain dynamic sitting balance with assistance during activities of daily living (OT) Outcome: Completed/Met Goal: LTG Patient will maintain dynamic standing with ADLs (OT) Description: LTG:  Patient will maintain dynamic standing balance with assist during activities of daily living (OT)  Outcome: Completed/Met   Problem: RH Grooming Goal: LTG Patient will perform grooming w/assist,cues/equip (OT) Description: LTG: Patient will perform grooming with assist, with/without cues using equipment (OT) Outcome: Completed/Met   Problem: RH Bathing Goal: LTG Patient will bathe all body parts with assist levels (OT) Description: LTG: Patient will bathe all body parts with assist levels (OT) Outcome: Completed/Met   Problem: RH Dressing Goal: LTG Patient will perform upper body dressing (OT) Description: LTG Patient will perform upper body dressing with assist, with/without cues (OT). Outcome: Completed/Met Goal: LTG Patient will perform lower body dressing w/assist (OT) Description: LTG: Patient will perform lower body dressing with assist, with/without cues in positioning using equipment (OT) Outcome: Completed/Met   Problem: RH Toileting Goal: LTG Patient will perform toileting task (3/3 steps) with assistance level (OT) Description: LTG: Patient will perform toileting task (3/3 steps) with assistance level (OT)  Outcome: Completed/Met   Problem: RH Simple Meal Prep Goal: LTG Patient will perform simple meal prep w/assist (OT) Description: LTG: Patient will perform simple meal prep with assistance, with/without cues (OT). Outcome: Completed/Met   Problem: RH Toilet Transfers Goal: LTG Patient will perform toilet transfers w/assist (OT) Description: LTG: Patient will perform toilet transfers with assist, with/without cues using equipment (OT) Outcome: Completed/Met    Problem: RH Tub/Shower Transfers Goal: LTG Patient will perform tub/shower transfers w/assist (OT) Description: LTG: Patient will perform tub/shower transfers with assist, with/without cues using equipment (OT) Outcome: Completed/Met   Problem: RH Awareness Goal: LTG: Patient will demonstrate awareness during functional activites type of (OT) Description: LTG: Patient will demonstrate awareness during functional activites type of (OT) Outcome: Completed/Met

## 2021-11-14 NOTE — Progress Notes (Signed)
Inpatient Rehabilitation Care Coordinator Discharge Note   Patient Details  Name: James Dougherty MRN: [id number] Date of Birth: Feb 14, 1950   Discharge location: D/c to home with his dtr James Dougherty who lives in [place] and she will transport him to medical appointments.  Length of Stay: 9 days  Discharge activity level: Supervision  Home/community participation: Limited  Patient response XT:KWIOXB Literacy - How often do you need to have someone help you when you read instructions, pamphlets, or other written material from your doctor or pharmacy?: Never  Patient response DZ:HGDJME Isolation - How often do you feel lonely or isolated from those around you?: Never  Services provided included: MD, RD, PT, OT, CM, Pharmacy, SW, Neuropsych, TR, SLP  Financial Services:  Charity fundraiser Utilized: [employer]  Choices offered to/list presented to: Yes  Follow-up services arranged:  Outpatient    Outpatient Servicies: [place] location for PT/OT      Patient response to transportation need: Is the patient able to respond to transportation needs?: Yes In the past 12 months, has lack of transportation kept you from medical appointments or from getting medications?: No In the past 12 months, has lack of transportation kept you from meetings, work, or from getting things needed for daily living?: No   Comments (or additional information):  Patient/Family verbalized understanding of follow-up arrangements:  Yes  Individual responsible for coordination of the follow-up plan: contact pt dtr James Dougherty  Confirmed correct DME delivered: Rana Snare 11/14/2021    Rana Snare

## 2021-11-14 NOTE — Progress Notes (Signed)
Physical Therapy Discharge Summary  Patient Details  Name: James Dougherty MRN: [id number] Date of Birth: [birth year]  Date of Discharge from PT service:November 14, 2021  Today's Date: 11/14/2021 PT Individual Time: 1105-1200 PT Individual Time Calculation (min): 55 min    Patient has met 9 of 9 long term goals due to improved activity tolerance, improved balance, improved postural control, increased strength, and improved coordination.  Patient to discharge at an ambulatory level Supervision.   Patient's care partner is independent to provide the necessary physical assistance at discharge.  Reasons goals not met: NA  Recommendation:  Patient will benefit from ongoing skilled PT services in outpatient setting to continue to advance safe functional mobility, address ongoing impairments in strength, balance, ambulation, endurance, and minimize fall risk.  Equipment: No equipment provided  Reasons for discharge: treatment goals met and discharge from hospital  Patient/family agrees with progress made and goals achieved: Yes  Skilled Therapeutic Interventions: Pt received seated in [facility] and agrees to therapy. No complaint of pain. Pt's daughter present for family education. PT provides education on pt's functional status and recommendation for supervision with mobility following DC, including use of RW for ambulation. Pt performs sit to stand with cues for initiation. Pt ambulates x300' with RW and cues for increased stride length to decrease risk for falls. Pt takes seated rest break. Pt completes car transfer and ramp navigation with cues for sequencing. Pt takes seated rest break prior to ambulating to main gym without AD, with daughter providing close supervision. Seated rest break. Pt completes x12 steps, 4 with R hand rail and close supervision, and x8 with bilateral hand rails. PT provides cues for step sequencing and posture to improve safety. Pt ambulates back to room without AD, with  cues to increase L stride length and step height. PT provides pt with HEP and performs demonstration of each exercise. Pt left seated in WC with all needs within reach.  PT Discharge Precautions/Restrictions Precautions Precautions: Fall Restrictions Weight Bearing Restrictions: No Pain Interference Pain Interference Pain Effect on Sleep: 1. Rarely or not at all Pain Interference with Therapy Activities: 1. Rarely or not at all Pain Interference with Day-to-Day Activities: 1. Rarely or not at all Vision/Perception  Vision - History Ability to See in Adequate Light: 0 Adequate Perception Perception: Within Functional Limits Praxis Praxis: Intact  Cognition Arousal/Alertness: Awake/alert Memory: Appears intact Safety/Judgment: Appears intact [employer] Scales of Cognitive Functioning: Purposeful, Appropriate Sensation Sensation Light Touch: Appears Intact Hot/Cold: Appears Intact Motor  Motor Motor: Within Functional Limits  Mobility Bed Mobility Bed Mobility: Supine to Sit;Sit to Supine Supine to Sit: Supervision/Verbal cueing Sit to Supine: Supervision/Verbal cueing Transfers Transfers: Sit to Stand;Stand to Sit;Stand Pivot Transfers Sit to Stand: Supervision/Verbal cueing Stand to Sit: Supervision/Verbal cueing Stand Pivot Transfers: Supervision/Verbal cueing Stand Pivot Transfer Details: Verbal cues for sequencing Transfer (Assistive device): None Locomotion  Gait Ambulation: Yes Gait Assistance: Supervision/Verbal cueing Gait Distance (Feet): 300 Feet Assistive device: None Gait Assistance Details: Verbal cues for sequencing;Verbal cues for gait pattern Gait Gait: Yes Gait Pattern: Impaired Gait Pattern: Shuffle;Trunk flexed (much improved from eval) Gait velocity: decreased Stairs / Additional Locomotion Stairs: Yes Stairs Assistance: Supervision/Verbal cueing Stair Management Technique: Two rails Number of Stairs: 12 Height of Stairs: 6 Ramp:  Supervision/Verbal cueing Curb: Supervision/Verbal cueing Wheelchair Mobility Wheelchair Mobility: No  Trunk/Postural Assessment  Cervical Assessment Cervical Assessment:  (forward head) Thoracic Assessment Thoracic Assessment:  (rounded shouldres) Lumbar Assessment Lumbar Assessment:  (posterior pelvic tilt) Postural  Control Postural Control:  (delayed, but much improved from eval)  Balance Static Sitting Balance Static Sitting - Balance Support: Feet supported Static Sitting - Level of Assistance: 7: Independent Dynamic Sitting Balance Dynamic Sitting - Balance Support: Feet supported Dynamic Sitting - Level of Assistance: 6: Modified independent (Device/Increase time) Static Standing Balance Static Standing - Balance Support: During functional activity;No upper extremity supported Static Standing - Level of Assistance: 6: Modified independent (Device/Increase time) Dynamic Standing Balance Dynamic Standing - Balance Support: During functional activity;No upper extremity supported Dynamic Standing - Level of Assistance: 5: Stand by assistance Extremity Assessment  RUE Assessment RUE Assessment: Within Functional Limits LUE Assessment LUE Assessment: Within Functional Limits RLE Assessment General Strength Comments: Hips 4+/5, otherwise grossly WFL LLE Assessment General Strength Comments: Hips 4+/5, otherwise grossly [facility]   Breck Coons, PT, DPT 11/14/2021, 12:06 PM

## 2021-11-14 NOTE — Progress Notes (Signed)
Inpatient Rehabilitation Discharge Medication Review by a Pharmacist  A complete drug regimen review was completed for this patient to identify any potential clinically significant medication issues.  High Risk Drug Classes Is patient taking? Indication by Medication  Antipsychotic No   Anticoagulant No   Antibiotic No   Opioid No   Antiplatelet No   Hypoglycemics/insulin Yes Novolin 02/40 insulin, trulicity-DM  Vasoactive Medication Yes Losartan, lasix, bisoprolol-HTN  Chemotherapy No   Other Yes Atorvastatin-HLD Methocarbamol-Spasms Senna/docusate-constipation Miralax-constipation Trazadone prn-sleep Gabapentin and Tylenol-pain Flonase prn- allergies [place]- dry skin      Type of Medication Issue Identified Description of Issue Recommendation(s)  Drug Interaction(s) (clinically significant)     Duplicate Therapy     Allergy     No Medication Administration End Date     Incorrect Dose     Additional Drug Therapy Needed     Significant med changes from prior encounter (inform family/care partners about these prior to discharge). ASA/Plavix being held Please educate patient on discontinuation  Other       Clinically significant medication issues were identified that warrant physician communication and completion of prescribed/recommended actions by midnight of the next day:  No  Name of provider notified for urgent issues identified:   Provider Method of Notification:     Pharmacist comments:   Time spent performing this drug regimen review (minutes):  20   Thank you for allowing [place] to participate in this patients care. Jens Som, PharmD 11/14/2021 1:17 PM  **Pharmacist phone directory can be found on [email or web address] listed under [place]**

## 2021-11-19 DIAGNOSIS — Z23 Encounter for immunization: Secondary | ICD-10-CM | POA: Diagnosis not present

## 2021-11-21 ENCOUNTER — Telehealth: Payer: Self-pay

## 2021-11-21 DIAGNOSIS — L97821 Non-pressure chronic ulcer of other part of left lower leg limited to breakdown of skin: Secondary | ICD-10-CM | POA: Diagnosis not present

## 2021-11-21 DIAGNOSIS — L97812 Non-pressure chronic ulcer of other part of right lower leg with fat layer exposed: Secondary | ICD-10-CM | POA: Diagnosis not present

## 2021-11-21 DIAGNOSIS — I872 Venous insufficiency (chronic) (peripheral): Secondary | ICD-10-CM | POA: Diagnosis not present

## 2021-11-21 DIAGNOSIS — I5020 Unspecified systolic (congestive) heart failure: Secondary | ICD-10-CM | POA: Diagnosis not present

## 2021-11-21 DIAGNOSIS — E871 Hypo-osmolality and hyponatremia: Secondary | ICD-10-CM | POA: Diagnosis not present

## 2021-11-21 DIAGNOSIS — I130 Hypertensive heart and chronic kidney disease with heart failure and stage 1 through stage 4 chronic kidney disease, or unspecified chronic kidney disease: Secondary | ICD-10-CM | POA: Diagnosis not present

## 2021-11-21 DIAGNOSIS — E1151 Type 2 diabetes mellitus with diabetic peripheral angiopathy without gangrene: Secondary | ICD-10-CM | POA: Diagnosis not present

## 2021-11-21 DIAGNOSIS — I890 Lymphedema, not elsewhere classified: Secondary | ICD-10-CM | POA: Diagnosis not present

## 2021-11-21 NOTE — Telephone Encounter (Signed)
Transition Care Management Unsuccessful Follow-up Telephone Call  Date of discharge and from where:  11/14/2021 Inpatient rehab   Attempts:  1st Attempt  Reason for unsuccessful TCM follow-up call:  Left voice message with daughter [name] to let her know paperwork was mailed to the address we have on file and father's appointment is set for Monday @ 2pm to arrive @ 1:40pm with Dr. Tressa Busman

## 2021-11-24 ENCOUNTER — Encounter: Payer: Medicare Other | Admitting: Physical Medicine and Rehabilitation

## 2021-11-26 NOTE — Progress Notes (Unsigned)
HISTORY AND PHYSICAL   HPI: This is a 71 y.o. male who is here today for follow up for bilateral asymptomatic carotid stenosis, PAD.      At his last appointment, we discussed transcarotid artery revascularization for critical, asymptomatic right ICA stenosis.  He was seen by his cardiologist Dr. Harl Bowie and was scheduled to undergo heart cath, however in the interim he suffered a fall resulting in subdural hematoma with partial effacement of the left lateral ventricle.  CT neck demonstrated large protrusion of C3-C4 with some cord compression.  Fortunately, bleeding stopped and he was able to have significant recovery spending the last several weeks in rehab.   He is now home, however his son recently moved in in an effort to help.  He remains independent per his daughter, but does have some speech delay.  He notes no sensorimotor deficits.  Deandrae is his daughter stated he has a repeat CT head scheduled for today to monitor the subdural hematoma which caused midline shift.  There is some concern that he continues to have a small bleed.  Jaycee continues to have short distance claudication, but this has not been lifestyle limiting due to his overall clinical condition.  Wounds to bilateral lower extremities have been healing, and were a product of fluid overload while in the hospital per his daughter. The pt is on a statin for cholesterol management.    The pt is on an aspirin.    Other AC:  Plavix The pt is on ARB, diuretic for hypertension.  The pt does not have diabetes. Tobacco hx:  former    Past Medical History:  Diagnosis Date   Arthritis    "hands" (12/27/2012)   High cholesterol    Hypertension    Neuropathic pain    PAD (peripheral artery disease) ([place])    Psoriasis    Stroke ([place]) 1999   "still have some speech problems at times; sometimes forget what I was going to say" (12/27/2012)   Type II diabetes mellitus ([place])    Ulcer    Left ankle/leg    Past Surgical History:   Procedure Laterality Date   ABDOMINAL AORTAGRAM N/A 03/30/2012   Procedure: ABDOMINAL Maxcine Ham;  Surgeon: Serafina Mitchell, MD;  Location: [facility] CATH LAB;  Service: Cardiovascular;  Laterality: N/A;   ABDOMINAL AORTAGRAM N/A 12/27/2012   Procedure: ABDOMINAL Maxcine Ham;  Surgeon: Serafina Mitchell, MD;  Location: [facility] CATH LAB;  Service: Cardiovascular;  Laterality: N/A;   ABDOMINAL AORTOGRAM W/LOWER EXTREMITY N/A 04/05/2017   Procedure: ABDOMINAL AORTOGRAM W/LOWER EXTREMITY;  Surgeon: Elam Dutch, MD;  Location: [place] CV LAB;  Service: Cardiovascular;  Laterality: N/A;   AMPUTATION Right 04/26/2017   Procedure: AMPUTATION TRANSMETATARSAL RIGHT GREAT TOE;  Surgeon: Rosetta Posner, MD;  Location: [facility] OR;  Service: Vascular;  Laterality: Right;   ANGIOPLASTY / STENTING FEMORAL Left 54/06/5033   APPLICATION OF WOUND VAC Right 04/26/2017   Procedure: APPLICATION OF WOUND VAC;  Surgeon: Rosetta Posner, MD;  Location: [place];  Service: Vascular;  Laterality: Right;   FEMORAL ARTERY STENT  03/30/2012   LOWER EXTREMITY ANGIOGRAM  12/27/2012   Procedure: LOWER EXTREMITY ANGIOGRAM;  Surgeon: Serafina Mitchell, MD;  Location: [facility] CATH LAB;  Service: Cardiovascular;;   LOWER EXTREMITY ANGIOGRAPHY N/A 04/19/2018   Procedure: LOWER EXTREMITY ANGIOGRAPHY;  Surgeon: Serafina Mitchell, MD;  Location: [place] CV LAB;  Service: Cardiovascular;  Laterality: N/A;   PERIPHERAL VASCULAR ATHERECTOMY Right 04/05/2017   Procedure: PERIPHERAL VASCULAR  ATHERECTOMY;  Surgeon: Sherren Kerns, MD;  Location: [facility] INVASIVE CV LAB;  Service: Cardiovascular;  Laterality: Right;  superficial femoral   PERIPHERAL VASCULAR BALLOON ANGIOPLASTY  04/19/2018   Procedure: PERIPHERAL VASCULAR BALLOON ANGIOPLASTY;  Surgeon: Nada Libman, MD;  Location: [facility] INVASIVE CV LAB;  Service: Cardiovascular;;   PERIPHERAL VASCULAR INTERVENTION Right 04/05/2017   Procedure: PERIPHERAL VASCULAR INTERVENTION;  Surgeon: Sherren Kerns, MD;  Location: [facility]  INVASIVE CV LAB;  Service: Cardiovascular;  Laterality: Right;   Superficial femorl and external iliac   POPLITEAL ARTERY STENT     TONSILLECTOMY AND ADENOIDECTOMY      Allergies  Allergen Reactions   Oxycodone Nausea And Vomiting   Glipizide     Bad headache and blurred vision   Bactrim [Sulfamethoxazole-Trimethoprim] Itching and Rash    Current Outpatient Medications  Medication Sig Dispense Refill   acetaminophen (TYLENOL) 325 MG tablet Take 2 tablets (650 mg total) by mouth 4 (four) times daily -  with meals and at bedtime. 100 tablet 0   atorvastatin (LIPITOR) 20 MG tablet Take 1 tablet (20 mg total) by mouth at bedtime. Too soon to refill 30 tablet 0   bisoprolol (ZEBETA) 5 MG tablet Take 0.5 tablets (2.5 mg total) by mouth daily. 30 tablet 0   Dulaglutide (TRULICITY) 1.5 MG/0.5ML SOPN Inject 1.5 mg into the skin once a week.     fluticasone (FLONASE) 50 MCG/ACT nasal spray Place 2 sprays into both nostrils daily as needed for allergies.     furosemide (LASIX) 20 MG tablet Take 1 tablet (20 mg total) by mouth daily. 30 tablet 0   gabapentin (NEURONTIN) 600 MG tablet Take 1 tablet (600 mg total) by mouth every evening. 30 tablet 0   losartan (COZAAR) 25 MG tablet Take 0.5 tablets (12.5 mg total) by mouth daily. Too soon to refill 30 tablet 0   methocarbamol (ROBAXIN) 750 MG tablet Take 1 tablet (750 mg total) by mouth every 8 (eight) hours. 90 tablet 0   NOVOLIN 70/30 RELION (70-30) 100 UNIT/ML injection Inject 16 Units into the skin every evening. 5 mL 12   Podiatric Products (GOLD BOND FOOT) CREA Apply 1 application topically 2 (two) times daily as needed (DRY SKIN). DIABETIC CREAM     polyethylene glycol powder (GLYCOLAX/MIRALAX) 17 GM/SCOOP powder Take 17 g by mouth daily. 238 g 0   senna-docusate (SENOKOT-S) 8.6-50 MG tablet Take 1 tablet by mouth 2 (two) times daily. 60 tablet 0   traZODone (DESYREL) 50 MG tablet Take 0.5 tablets (25 mg total) by mouth at bedtime as needed for  sleep. 15 tablet 0   triamcinolone cream (KENALOG) 0.1 % Apply 1 Application topically 2 (two) times daily. Mix with OTC eucerin 1:1. 120 g 0   No current facility-administered medications for this visit.    Family History  Problem Relation Age of Onset   Diabetes Sister        Bilateral amputation of lower legs   Diabetes Sister    Heart disease Sister 52       Heart disease before age 80   Hypertension Sister    Heart attack Sister    Hyperlipidemia Sister    Hypertension Father    Hyperlipidemia Father    Hyperlipidemia Mother    Hypertension Mother    Hypertension Daughter     Social History   Socioeconomic History   Marital status: Single    Spouse name: Not on file   Number of children: Not on  file   Years of education: Not on file   Highest education level: Not on file  Occupational History   Not on file  Tobacco Use   Smoking status: Former    Packs/day: 3.00    Years: 45.00    Total pack years: 135.00    Types: Cigarettes    Quit date: 01/07/2011    Years since quitting: 10.8    Passive exposure: Never   Smokeless tobacco: Never  Vaping Use   Vaping Use: Never used  Substance and Sexual Activity   Alcohol use: Not Currently    Comment: 12/27/2012 "couple times/yr I might have a drink"   Drug use: No   Sexual activity: Never  Other Topics Concern   Not on file  Social History Narrative   Not on file   Social Determinants of Health   Financial Resource Strain: Not on file  Food Insecurity: Not on file  Transportation Needs: Not on file  Physical Activity: Not on file  Stress: Not on file  Social Connections: Not on file  Intimate Partner Violence: Not on file     REVIEW OF SYSTEMS:   [X]  denotes positive finding, [ ]  denotes negative finding Cardiac  Comments:  Chest pain or chest pressure:    Shortness of breath upon exertion: x   Short of breath when lying flat:    Irregular heart rhythm:        Vascular    Pain in calf, thigh, or  hip brought on by ambulation: x   Pain in feet at night that wakes you up from your sleep:     Blood clot in your veins:    Leg swelling:         Pulmonary    Oxygen at home:    Productive cough:     Wheezing:         Neurologic    Sudden weakness in arms or legs:     Sudden numbness in arms or legs:     Sudden onset of difficulty speaking or slurred speech:    Temporary loss of vision in one eye:     Problems with dizziness:         Gastrointestinal    Blood in stool:     Vomited blood:         Genitourinary    Burning when urinating:     Blood in urine:        Psychiatric    Major depression:         Hematologic    Bleeding problems:    Problems with blood clotting too easily:        Skin    Rashes or ulcers: x       Constitutional    Fever or chills:      PHYSICAL EXAMINATION:  There were no vitals filed for this visit.  There is no height or weight on file to calculate BMI.   General:  WDWN in NAD; vital signs documented above Gait: Not observed HENT: WNL, normocephalic Pulmonary: normal non-labored breathing , without wheezing Cardiac: regular HR, with carotid bruit on the right Abdomen: soft, NT Skin: with rash in left groin that is malodorous Vascular Exam/Pulses:  Right Left  Radial 2+ (normal) 2+ (normal)  Femoral Unable to palpate Unable to palpate  DP monophasic Dampened monophasic  PT Brisk biphasic Brisk monophasic  Peroneal monophasic Dampened monophasic   Extremities: without ischemic changes, without Gangrene , without cellulitis; with superficial  wound LLE with BLE swelling Musculoskeletal: no muscle wasting or atrophy  Neurologic: A&O X 3 Psychiatric:  The pt has Normal affect.   Non-Invasive Vascular Imaging:   ABI's/TBI's on 10/03/2021: Right:  0.80/amp (M) - Great toe pressure: amp Left:  [place]/0.22 (dampened monophasic) - Great toe pressure: 33  Arterial duplex on 03/19/2021: IMPRESSION: 1. Advanced bilateral lower extremity  peripheral artery disease with a suggestion of inflow disease bilaterally. CTA abdomen pelvis with runoff or direct angiography could be considered for further characterization. 2. Multifocal atherosclerotic plaques visualized in the bilateral lower extremities with possible flow-limiting stenoses in the right common femoral and left proximal superficial femoral arteries.  Previous ABI's/TBI's on 02/06/2021: Right:  0.70/amp - Great toe pressure: amp Left:  0.72/0.15 - Great toe pressure:  23  Previous arterial duplex on 03/19/2021: IMPRESSION: 1. Advanced bilateral lower extremity peripheral artery disease with a suggestion of inflow disease bilaterally. CTA abdomen pelvis with runoff or direct angiography could be considered for further characterization. 2. Multifocal atherosclerotic plaques visualized in the bilateral lower extremities with possible flow-limiting stenoses in the right common femoral and left proximal superficial femoral arteries.    ASSESSMENT/PLAN:: 71 y.o. male who presents to clinic with asymptomatic bilateral carotid stenosis.  The right side has been critically stenosed since his last visit.  Patient also has known short distance claudication and chronic venous insufficiency.  In short, Khaleb has had significant healthcare issues since he was last seen.  Carotid surgery was placed on hold for cardiac cath which has not occurred due to fall resulting in subdural hematoma with midline shift.  Jarion is scheduled for repeat CT today to monitor the subdural hematoma as there is some concern for a slow leak per his daughter.  I had a long conversation with them regarding his care.  We discussed that there is a 11% stroke risk in 5 years in patients who have asymptomatic carotid artery stenosis that is medically managed versus 5% in those who undergo surgery.  Unfortunately, with his recent trauma, he is not a candidate as he needs dual antiplatelet therapy and must be heparinized  for surgery.  My plan is to see him back in the coming months once his subdural hematoma has resolved.  I would like him cleared not only by neurology but also by cardiology and asked that he move forward with cardiac cath preoperatively.  Regarding his lower extremities, he has not had significant changes.  The venous wounds have healed well.  There is 1 small abrasion to the anterior portion of the tibia which is healing nicely.  Short distance claudication is stable.  After discussing the above, both Jeremyah and his daughter were comfortable with follow-up, at which time we will discuss elective carotid surgery.  Victorino Sparrow MD

## 2021-11-28 ENCOUNTER — Ambulatory Visit (HOSPITAL_COMMUNITY)
Admission: RE | Admit: 2021-11-28 | Discharge: 2021-11-28 | Disposition: A | Discharge status: Home / Self Care | Payer: Medicare Other | Source: Ambulatory Visit | Attending: Vascular Surgery | Admitting: Vascular Surgery

## 2021-11-28 ENCOUNTER — Ambulatory Visit: Payer: Medicare Other | Admitting: Vascular Surgery

## 2021-11-28 VITALS — BP 166/85 | HR 77 | Temp 97.5°F | Resp 18 | Ht 68.0 in | Wt 233.0 lb

## 2021-11-28 DIAGNOSIS — I6529 Occlusion and stenosis of unspecified carotid artery: Secondary | ICD-10-CM | POA: Diagnosis not present

## 2021-11-28 DIAGNOSIS — I6523 Occlusion and stenosis of bilateral carotid arteries: Secondary | ICD-10-CM | POA: Diagnosis not present

## 2021-11-28 DIAGNOSIS — S065XAA Traumatic subdural hemorrhage with loss of consciousness status unknown, initial encounter: Secondary | ICD-10-CM | POA: Diagnosis not present

## 2021-11-28 DIAGNOSIS — I6381 Other cerebral infarction due to occlusion or stenosis of small artery: Secondary | ICD-10-CM | POA: Diagnosis not present

## 2021-12-02 DIAGNOSIS — M6281 Muscle weakness (generalized): Secondary | ICD-10-CM | POA: Diagnosis not present

## 2021-12-02 DIAGNOSIS — Z9989 Dependence on other enabling machines and devices: Secondary | ICD-10-CM

## 2021-12-02 DIAGNOSIS — R2681 Unsteadiness on feet: Secondary | ICD-10-CM | POA: Diagnosis not present

## 2021-12-02 DIAGNOSIS — S069XAD Unspecified intracranial injury with loss of consciousness status unknown, subsequent encounter: Secondary | ICD-10-CM | POA: Diagnosis not present

## 2021-12-02 HISTORY — DX: Dependence on other enabling machines and devices: Z99.89

## 2021-12-03 ENCOUNTER — Encounter: Payer: Self-pay | Admitting: Physical Medicine and Rehabilitation

## 2021-12-03 ENCOUNTER — Encounter
Payer: Medicare Other | Attending: Physical Medicine and Rehabilitation | Admitting: Physical Medicine and Rehabilitation

## 2021-12-03 VITALS — BP 129/74 | HR 71 | Ht 68.0 in | Wt 230.0 lb

## 2021-12-03 DIAGNOSIS — M792 Neuralgia and neuritis, unspecified: Secondary | ICD-10-CM | POA: Insufficient documentation

## 2021-12-03 DIAGNOSIS — I890 Lymphedema, not elsewhere classified: Secondary | ICD-10-CM | POA: Diagnosis not present

## 2021-12-03 DIAGNOSIS — S065XAA Traumatic subdural hemorrhage with loss of consciousness status unknown, initial encounter: Secondary | ICD-10-CM | POA: Insufficient documentation

## 2021-12-03 NOTE — Assessment & Plan Note (Signed)
Continue OP therapies  No equipment needs. No safety concerns.   CT repeat pending with neurosurgery; if unchanged, he will be released from their care  Follow up with PM&R PRN

## 2021-12-03 NOTE — Assessment & Plan Note (Signed)
Continue compression stockings and medihoney with foam bandages for recurrent wound management, which is overall much improved.  Follow up with wound care clinic if any persistent or concerning wounds; discussed appearances concerning for infection

## 2021-12-03 NOTE — Progress Notes (Signed)
Subjective:    Patient ID: James Dougherty, male    DOB: 1950-05-28, 71 y.o.   MRN: [id number]  HPI  James Dougherty is a 71 y.o. male with history of chronic BLE wounds with lymphedema, T2DM with neuropathy, PAD, recent admission for hyponatremia with cystitis who presents to PM&R clinic as a TOC from IPR admission 10/10-10/20 for SDH. He presents today with his daughter.   Briefly, he sustained a fall at tire shop and came to the ED lethargic and was found to have right-sided weakness. CT of head revealing acute left cerebral SDH with partial effacement of left lateral ventricle CT neck showed large protrusion of C3/C4 with moderate to severe canal stenosis and likely cord compression.  He was treated with DDAVP as well as platelets.  MRI C-spine recommended but not done due to severe claustrophobia. Dr. Maisie Fus was consulted, recommended Keppra x7 days as well as repeat CT head to monitor for stability.  Dr. Roda Shutters also consulted for input on right-sided weakness and recommended MRI as well as keeping BP at 1 30-1 50 range due to carotid artery stenosis.  Serial CT of head showed stable bleed.  Carotid Doppler showed 80 to 99% right ICA stenosis and 60 to 79% left-ICA stenosis.  CIR was recommended due to functional decline. IPR stay c/b infection of prior IV site in RUE (5-day course of doxycycline), multiple bilateral LE wounds d/t edema (manuka honey with dry dressings, TEDs), psoriasis (eucerin cream), and AKI (IV fluids).  CT of head was repeated prior to discharge showing some increase in hemorrhage as well as edema; Neurosurgery was consulted and evaluated films and recommended repeat CT head in 2 weeks with follow-up in office.  Patient and daughter have been educated on monitoring for any neurological changes and that if this occurred to seek, they are to present to ED immediately. He was referred to [facility] outpatient rehab after discharge.  Interval Hx:  - Therapies: Started yesterday  morning; states it went well. PT, OT only. OT working on strength in L hand, PT balance and ambulation. Only doing total of 4 sessions.   - Follow ups: Last week followed up with Vascular, this week saw Neurology, next week Cardiology. Has a CT in 6 weeks and then will be cleared if it looks good.   - Falls: no; completely stable, no balance issues. Family has been watching intermittently. Dr. Neita Carp released him to drive.   - DME: At home, he's independent. In the community, he uses the walker.   - Medications: Takes gabapentin for his neuropathy nightly. Still takes robaxin Q8H but endorses no pain. He doesn't take Trazodone anymore.   - Other concerns: Doing well, fairly independent.   Pain Inventory Average Pain 0 Pain Right Now 0 My pain is  no pain  LOCATION OF PAIN  no pain  BOWEL Number of stools per week: 7   BLADDER Normal   Mobility walk with assistance use a walker ability to climb steps?  yes do you drive?  yes Do you have any goals in this area?  yes  Function retired  Neuro/Psych trouble walking  Prior Studies Any changes since last visit?  no  Physicians involved in your care Any changes since last visit?  no   Family History  Problem Relation Age of Onset   Diabetes Sister        Bilateral amputation of lower legs   Diabetes Sister    Heart disease Sister 20  Heart disease before age 21   Hypertension Sister    Heart attack Sister    Hyperlipidemia Sister    Hypertension Father    Hyperlipidemia Father    Hyperlipidemia Mother    Hypertension Mother    Hypertension Daughter    Social History   Socioeconomic History   Marital status: Single    Spouse name: Not on file   Number of children: Not on file   Years of education: Not on file   Highest education level: Not on file  Occupational History   Not on file  Tobacco Use   Smoking status: Former    Packs/day: 3.00    Years: 45.00    Total pack years: 135.00    Types:  Cigarettes    Quit date: 01/07/2011    Years since quitting: 10.9    Passive exposure: Never   Smokeless tobacco: Never  Vaping Use   Vaping Use: Never used  Substance and Sexual Activity   Alcohol use: Not Currently    Comment: 12/27/2012 "couple times/yr I might have a drink"   Drug use: No   Sexual activity: Never  Other Topics Concern   Not on file  Social History Narrative   Not on file   Social Determinants of Health   Financial Resource Strain: Not on file  Food Insecurity: Not on file  Transportation Needs: Not on file  Physical Activity: Not on file  Stress: Not on file  Social Connections: Not on file   Past Surgical History:  Procedure Laterality Date   ABDOMINAL AORTAGRAM N/A 03/30/2012   Procedure: ABDOMINAL Ronny Flurry;  Surgeon: Nada Libman, MD;  Location: [facility] CATH LAB;  Service: Cardiovascular;  Laterality: N/A;   ABDOMINAL AORTAGRAM N/A 12/27/2012   Procedure: ABDOMINAL Ronny Flurry;  Surgeon: Nada Libman, MD;  Location: [facility] CATH LAB;  Service: Cardiovascular;  Laterality: N/A;   ABDOMINAL AORTOGRAM W/LOWER EXTREMITY N/A 04/05/2017   Procedure: ABDOMINAL AORTOGRAM W/LOWER EXTREMITY;  Surgeon: Sherren Kerns, MD;  Location: [facility] INVASIVE CV LAB;  Service: Cardiovascular;  Laterality: N/A;   AMPUTATION Right 04/26/2017   Procedure: AMPUTATION TRANSMETATARSAL RIGHT GREAT TOE;  Surgeon: Larina Earthly, MD;  Location: [facility] OR;  Service: Vascular;  Laterality: Right;   ANGIOPLASTY / STENTING FEMORAL Left 12/27/2012   APPLICATION OF WOUND VAC Right 04/26/2017   Procedure: APPLICATION OF WOUND VAC;  Surgeon: Larina Earthly, MD;  Location: [facility] OR;  Service: Vascular;  Laterality: Right;   FEMORAL ARTERY STENT  03/30/2012   LOWER EXTREMITY ANGIOGRAM  12/27/2012   Procedure: LOWER EXTREMITY ANGIOGRAM;  Surgeon: Nada Libman, MD;  Location: [facility] CATH LAB;  Service: Cardiovascular;;   LOWER EXTREMITY ANGIOGRAPHY N/A 04/19/2018   Procedure: LOWER EXTREMITY ANGIOGRAPHY;  Surgeon:  Nada Libman, MD;  [facility] INVASIVE CV LAB;  Service: Cardiovascular;  Laterality: N/A;   PERIPHERAL VASCULAR ATHERECTOMY Right 04/05/2017   Procedure: PERIPHERAL VASCULAR ATHERECTOMY;  Surgeon: Sherren Kerns, MD;  Location: [facility] INVASIVE CV LAB;  Service: Cardiovascular;  Laterality: Right;  superficial femoral   PERIPHERAL VASCULAR BALLOON ANGIOPLASTY  04/19/2018   Procedure: PERIPHERAL VASCULAR BALLOON ANGIOPLASTY;  Surgeon: Nada Libman, MD;  Location[facility] INVASIVE CV LAB;  Service: Cardiovascular;;   PERIPHERAL VASCULAR INTERVENTION Right 04/05/2017   Procedure: PERIPHERAL VASCULAR INTERVENTION;  Surgeon: Sherren Kerns, MD;  Location: [facility] INVASIVE CV LAB;  Service: Cardiovascular;  Laterality: Right;   Superficial femorl and external iliac   POPLITEAL ARTERY STENT  TONSILLECTOMY AND ADENOIDECTOMY     Past Medical History:  Diagnosis Date   Arthritis    "hands" (12/27/2012)   High cholesterol    Hypertension    Neuropathic pain    PAD (peripheral artery disease) ([facility])    Psoriasis    Stroke ([facility]) 1999   "still have some speech problems at times; sometimes forget what I was going to say" (12/27/2012)   Type II diabetes mellitus ([facility])    Ulcer    Left ankle/leg   BP 129/74   Pulse 71   Ht 5\' 8"  (1.727 m)   Wt 230 lb (104.3 kg)   SpO2 97%   BMI 34.97 kg/m   Opioid Risk Score:   Fall Risk Score:  `1  Depression screen [facility] 2/9     12/03/2021    1:46 PM  Depression screen PHQ 2/9  Decreased Interest 0  Down, Depressed, Hopeless 0  PHQ - 2 Score 0  Altered sleeping 0  Tired, decreased energy 0  Change in appetite 0  Feeling bad or failure about yourself  0  Trouble concentrating 0  Moving slowly or fidgety/restless 0  Suicidal thoughts 0  PHQ-9 Score 0  Difficult doing work/chores Not difficult at all      Review of Systems  All other systems reviewed and are negative.     Objective:   Physical Exam  Constitutional: No apparent distress.  Appropriate appearance for age.  HENT: No JVD. Neck Supple. Trachea midline. Atraumatic, normocephalic. Eyes: PERRLA. EOMI. Visual fields grossly intact.  Cardiovascular: RRR, no murmurs/rub/gallops. 1+ Edema b/l with compression socks. Peripheral pulses 2+  Respiratory: CTAB. No rales, rhonchi, or wheezing. On RA.  Abdomen: + bowel sounds, normoactive. No distention or tenderness.  Skin: Multiple open wounds on legs with scan serous drainage. Covered in bandaids. No erythema or purulence.  MSK:      No apparent deformity.      Strength:                RUE: 5-/5 SA, 5/5 EF, 5/5 EE, 5/5 WE, 5-/5 FF, 5-/5 FA                 LUE: 5/5 SA, 5/5 EF, 5/5 EE, 5/5 WE, 5/5 FF, 5/5 FA                 RLE: 5/5 HF, 5/5 KE, 5/5 DF, 5/5 EHL, 5/5 PF                 LLE:  5/5 HF, 5/5 KE, 5/5 DF, 5/5 EHL, 5/5 PF   Neurologic exam:  Cognition: AAO to person, place, time and event.  Language: Fluent, No substitutions or neoglisms. No dysarthria. Names 3/3 objects correctly.  Insight: Good insight into current condition.  Mood: Pleasant affect, appropriate mood.  Sensation: To light touch intact in BL UEs and LEs  Reflexes: 2+ in BL UE and LEs. Negative Hoffman's and babinski signs bilaterally.  CN: 2-12 grossly intact.  Coordination: No apparent tremors. No ataxia on FTN, HTS bilaterally.  Spasticity: MAS 0 in all extremities.  Gait: Good clearance, stance and stride with RW.          Assessment & Plan:   James Dougherty is a 71 y.o. male with history of chronic BLE wounds with lymphedema, T2DM with neuropathy, PAD, recent admission for hyponatremia with cystitis who presents to PM&R clinic as a TOC from IPR admission 10/10-10/20 for SDH. He presents today with his  daughter.   Subdural hematoma ([facility]) Assessment & Plan: Continue OP therapies  No equipment needs. No safety concerns.   CT repeat pending with neurosurgery; if unchanged, he will be released from their care  Follow up with PM&R PRN     Lymphedema Assessment & Plan: Continue compression stockings and medihoney with foam bandages for recurrent wound management, which is overall much improved.  Follow up with wound care clinic if any persistent or concerning wounds; discussed appearances concerning for infection   Neuropathic pain Assessment & Plan: Continue gabapentin QHS  Recommended transitioning Robaxin from Q8H to TID PRN due to no ongoing pain and only rare muscle cramps (most recently related to flu shot)      Angelina Sheriff, DO 12/03/2021

## 2021-12-03 NOTE — Assessment & Plan Note (Signed)
Continue gabapentin QHS  Recommended transitioning Robaxin from Q8H to TID PRN due to no ongoing pain and only rare muscle cramps (most recently related to flu shot)

## 2021-12-03 NOTE — Patient Instructions (Signed)
Stop taking Robaxin; you may use one if you have a muscle ache or spasm, but do not take them consistently.    Follow up as needed

## 2021-12-04 ENCOUNTER — Other Ambulatory Visit: Payer: Self-pay

## 2021-12-04 DIAGNOSIS — I739 Peripheral vascular disease, unspecified: Secondary | ICD-10-CM

## 2021-12-04 DIAGNOSIS — I6523 Occlusion and stenosis of bilateral carotid arteries: Secondary | ICD-10-CM

## 2021-12-08 DIAGNOSIS — E1151 Type 2 diabetes mellitus with diabetic peripheral angiopathy without gangrene: Secondary | ICD-10-CM | POA: Diagnosis not present

## 2021-12-08 DIAGNOSIS — M79672 Pain in left foot: Secondary | ICD-10-CM | POA: Diagnosis not present

## 2021-12-08 DIAGNOSIS — B351 Tinea unguium: Secondary | ICD-10-CM | POA: Diagnosis not present

## 2021-12-08 DIAGNOSIS — M79671 Pain in right foot: Secondary | ICD-10-CM | POA: Diagnosis not present

## 2021-12-09 DIAGNOSIS — S069XAD Unspecified intracranial injury with loss of consciousness status unknown, subsequent encounter: Secondary | ICD-10-CM | POA: Diagnosis not present

## 2021-12-09 DIAGNOSIS — R2681 Unsteadiness on feet: Secondary | ICD-10-CM | POA: Diagnosis not present

## 2021-12-09 DIAGNOSIS — R197 Diarrhea, unspecified: Secondary | ICD-10-CM | POA: Diagnosis not present

## 2021-12-09 DIAGNOSIS — M6281 Muscle weakness (generalized): Secondary | ICD-10-CM | POA: Diagnosis not present

## 2021-12-11 ENCOUNTER — Ambulatory Visit: Payer: Medicare Other | Attending: Cardiology | Admitting: Cardiology

## 2021-12-11 ENCOUNTER — Encounter: Payer: Self-pay | Admitting: Cardiology

## 2021-12-11 VITALS — BP 116/64 | HR 74 | Ht 68.0 in | Wt 231.0 lb

## 2021-12-11 DIAGNOSIS — I5022 Chronic systolic (congestive) heart failure: Secondary | ICD-10-CM

## 2021-12-11 DIAGNOSIS — Z79899 Other long term (current) drug therapy: Secondary | ICD-10-CM

## 2021-12-11 MED ORDER — SPIRONOLACTONE 25 MG PO TABS: 12.5000 mg | ORAL_TABLET | Freq: Every day | ORAL | 3 refills | Status: DC

## 2021-12-11 NOTE — Patient Instructions (Signed)
Medication Instructions:  Your physician has recommended you make the following change in your medication:  Start Spironolactone 12.5 mg tablets daily   Labwork: In 1 week: -BMET  Testing/Procedures: None  Follow-[facility]  Any Other Special Instructions Will Be Listed Below (If Applicable).     If you need a refill on your cardiac medications before your next appointment, please call your pharmacy.

## 2021-12-11 NOTE — Progress Notes (Signed)
Clinical Summary James Dougherty is a 71 y.o.male seen today for follow up of the following medical problems.       1.Abnormal stress test/Systolic dysfunction - ordered for preop for TCAR - 04/2021 nuclear stress: apical to basal anteroseptal scar with very mild ishcemia toward apex. Likely inferior artifact but cannot exclude component of scar. LVEF 23% - 05/2021 echo: poor visualization, LVEF roughly 30-40%. Grade III dd.  - 06/2021 echo with contrast: LVEF 35%, global hypokiensis     - some SOB with activities with SOB,though still can do elliptical up 1 hour.  - no chest pains - he reports high K on higher doses of losartan, pcp lowered dose   - last visit started toprol 25mg  daily. Had some SOB and stopped taking. Does have some smoking history.    -tolerating bisoprolol. He is on losartan low dose, some high K on higher doses in the past - home bp's 110s.  - he priced jardiance at $45      2. Hyponatremia - recent admiossion 08/2021 after pcp labs showed low sodium to 120 - thought secondary to HCTZ, poor oral intake   3. 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  - at d/c plan for repeat CT in 2 weeks, continue to hold ASA and plavix until f/u - has f/u Dec 01/14/22, CT few days before - remains off ASA and plavix       Other medical issues not addressed this visit   2.PAD - multiple prior interventions Left SFA-pop stenting by Dr. 01/16/22 Right great toe ray amputation by Dr. Myra Gianotti Right SFA atherectomy and stenting by Dr. Arbie Cookey     3. Carotid stenosis bilateral carotid duplex ultrasound demonstrated greater than 80% stenosis of the right internal carotid artery.  He had a follow-up CT a demonstrating roughly 80% stenosis   4.Preoperative evaluation - considering TCAR - no chest pain, no SOB/DOE - goes to [facility] daily, does 60 minutes ellipitical.At least 5 days a week.         Past Medical History:  Diagnosis Date   Arthritis    "hands" (12/27/2012)   High cholesterol    Hypertension    Neuropathic pain    PAD (peripheral artery disease) (HCC)    Psoriasis    Stroke (HCC) 1999   "still have some speech problems at times; sometimes forget what I was going to say" (12/27/2012)   Type II diabetes mellitus (HCC)    Ulcer    Left ankle/leg     Allergies  Allergen Reactions   Oxycodone Nausea And Vomiting   Glipizide     Bad headache and blurred vision   Bactrim [Sulfamethoxazole-Trimethoprim] Itching and Rash     Current Outpatient Medications  Medication Sig Dispense Refill   acetaminophen (TYLENOL) 325 MG tablet Take 2 tablets (650 mg total) by mouth 4 (four) times daily -  with meals and at bedtime. 100 tablet 0   atorvastatin (LIPITOR) 20 MG tablet Take 1 tablet (20 mg total) by mouth at bedtime. Too soon to refill 30 tablet 0   bisoprolol (ZEBETA) 5 MG tablet Take 0.5 tablets (2.5 mg total) by mouth daily. 30 tablet 0   Dulaglutide (TRULICITY) 1.5 MG/0.5ML SOPN Inject 1.5 mg into the skin once a week.     fluticasone (FLONASE) 50 MCG/ACT nasal spray Place 2 sprays into both nostrils daily as needed for allergies.  furosemide (LASIX) 20 MG tablet Take 1 tablet (20 mg total) by mouth daily. 30 tablet 0   gabapentin (NEURONTIN) 600 MG tablet Take 1 tablet (600 mg total) by mouth every evening. 30 tablet 0   losartan (COZAAR) 25 MG tablet Take 0.5 tablets (12.5 mg total) by mouth daily. Too soon to refill 30 tablet 0   methocarbamol (ROBAXIN) 750 MG tablet Take 1 tablet (750 mg total) by mouth every 8 (eight) hours. 90 tablet 0   NOVOLIN 70/30 RELION (70-30) 100 UNIT/ML injection Inject 16 Units into the skin every evening. 5 mL 12   Podiatric Products (GOLD BOND FOOT) CREA Apply 1 application topically 2 (two) times daily as needed (DRY SKIN). DIABETIC CREAM     polyethylene glycol powder (GLYCOLAX/MIRALAX) 17 GM/SCOOP powder Take 17 g by mouth  daily. 238 g 0   senna-docusate (SENOKOT-S) 8.6-50 MG tablet Take 1 tablet by mouth 2 (two) times daily. 60 tablet 0   traZODone (DESYREL) 50 MG tablet Take 0.5 tablets (25 mg total) by mouth at bedtime as needed for sleep. 15 tablet 0   triamcinolone cream (KENALOG) 0.1 % Apply 1 Application topically 2 (two) times daily. Mix with OTC eucerin 1:1. 120 g 0   No current facility-administered medications for this visit.     Past Surgical History:  Procedure Laterality Date   ABDOMINAL AORTAGRAM N/A 03/30/2012   Procedure: ABDOMINAL AORTAGRAM;  Surgeon: Nada Libman, MD;  Location: [facility] CATH LAB;  Service: Cardiovascular;  Laterality: N/A;   ABDOMINAL AORTAGRAM N/A 12/27/2012   Procedure: ABDOMINAL Ronny Flurry;  Surgeon: Nada Libman, MD;  Location: [facility] CATH LAB;  Service: Cardiovascular;  Laterality: N/A;   ABDOMINAL AORTOGRAM W/LOWER EXTREMITY N/A 04/05/2017   Procedure: ABDOMINAL AORTOGRAM W/LOWER EXTREMITY;  Surgeon: Sherren Kerns, MD;  Location: [facility] INVASIVE CV LAB;  Service: Cardiovascular;  Laterality: N/A;   AMPUTATION Right 04/26/2017   Procedure: AMPUTATION TRANSMETATARSAL RIGHT GREAT TOE;  Surgeon: Larina Earthly, MD;  Location: [facility] OR;  Service: Vascular;  Laterality: Right;   ANGIOPLASTY / STENTING FEMORAL Left 12/27/2012   APPLICATION OF WOUND VAC Right 04/26/2017   Procedure: APPLICATION OF WOUND VAC;  Surgeon: Larina Earthly, MD;  Location: [facility] OR;  Service: Vascular;  Laterality: Right;   FEMORAL ARTERY STENT  03/30/2012   LOWER EXTREMITY ANGIOGRAM  12/27/2012   Procedure: LOWER EXTREMITY ANGIOGRAM;  Surgeon: Nada Libman, MD;  Location: [facility] CATH LAB;  Service: Cardiovascular;;   LOWER EXTREMITY ANGIOGRAPHY N/A 04/19/2018   Procedure: LOWER EXTREMITY ANGIOGRAPHY;  Surgeon: Nada Libman, MD;  Location: [facility] INVASIVE CV LAB;  Service: Cardiovascular;  Laterality: N/A;   PERIPHERAL VASCULAR ATHERECTOMY Right 04/05/2017   Procedure: PERIPHERAL VASCULAR ATHERECTOMY;  Surgeon: Sherren Kerns, MD;  Location: [facility] INVASIVE CV LAB;  Service: Cardiovascular;  Laterality: Right;  superficial femoral   PERIPHERAL VASCULAR BALLOON ANGIOPLASTY  04/19/2018   Procedure: PERIPHERAL VASCULAR BALLOON ANGIOPLASTY;  Surgeon: Nada Libman, MD;  Location: [facility] INVASIVE CV LAB;  Service: Cardiovascular;;   PERIPHERAL VASCULAR INTERVENTION Right 04/05/2017   Procedure: PERIPHERAL VASCULAR INTERVENTION;  Surgeon: Sherren Kerns, MD;  Location: [facility] INVASIVE CV LAB;  Service: Cardiovascular;  Laterality: Right;   Superficial femorl and external iliac   POPLITEAL ARTERY STENT     TONSILLECTOMY AND ADENOIDECTOMY       Allergies  Allergen Reactions   Oxycodone Nausea And Vomiting   Glipizide     Bad headache and blurred vision   Bactrim [Sulfamethoxazole-Trimethoprim]  Itching and Rash      Family History  Problem Relation Age of Onset   Diabetes Sister        Bilateral amputation of lower legs   Diabetes Sister    Heart disease Sister [id number]       Heart disease before age 71   Hypertension Sister    Heart attack Sister    Hyperlipidemia Sister    Hypertension Father    Hyperlipidemia Father    Hyperlipidemia Mother    Hypertension Mother    Hypertension Daughter      Social History Mr. Diesel reports that he quit smoking about 10 years ago. His smoking use included cigarettes. He has a 135.00 pack-year smoking history. He has never been exposed to tobacco smoke. He has never used smokeless tobacco. [name] reports that he does not currently use alcohol.   Review of Systems CONSTITUTIONAL: No weight loss, fever, chills, weakness or fatigue.  HEENT: Eyes: No visual loss, blurred vision, double vision or yellow sclerae.No hearing loss, sneezing, congestion, runny nose or sore throat.  SKIN: No rash or itching.  CARDIOVASCULAR: per hpi RESPIRATORY: No shortness of breath, cough or sputum.  GASTROINTESTINAL: No anorexia, nausea, vomiting or diarrhea. No abdominal pain or  blood.  GENITOURINARY: No burning on urination, no polyuria NEUROLOGICAL: No headache, dizziness, syncope, paralysis, ataxia, numbness or tingling in the extremities. No change in bowel or bladder control.  MUSCULOSKELETAL: No muscle, back pain, joint pain or stiffness.  LYMPHATICS: No enlarged nodes. No history of splenectomy.  PSYCHIATRIC: No history of depression or anxiety.  ENDOCRINOLOGIC: No reports of sweating, cold or heat intolerance. No polyuria or polydipsia.  [name].   Physical Examination Today's Vitals   12/11/21 1143  BP: 116/64  Pulse: 74  SpO2: 98%  Weight: 231 lb (104.8 kg)  Height: 5\' 8"  (1.727 m)   Body mass index is 35.12 kg/m.  Gen: resting comfortably, no acute distress HEENT: no scleral icterus, pupils equal round and reactive, no palptable cervical adenopathy,  CV: RRR, no m/rg/ no jvd Resp: Clear to auscultation bilaterally GI: abdomen is soft, non-tender, non-distended, normal bowel sounds, no hepatosplenomegaly MSK: extremities are warm, no edema.  Skin: warm, no rash Neuro:  no focal deficits Psych: appropriate affect   Diagnostic Studies  04/2021 nuclear stress  Findings are consistent with prior myocardial infarction with peri-infarct ischemia. The study is high risk.   No ST deviation was noted. Arrhythmias during stress: rare PACs, rare PVCs. The ECG was negative for ischemia.   LV perfusion is abnormal.  Small, moderate intensity, apical to basal anteroseptal defect that is fixed with the exception of partial reversibility at the very apex.  There is also a small, mild intensity inferior defect from apex to base that is most prominent on rest imaging and consistent with soft tissue attenuation, although cannot completely exclude component of scar.   Left ventricular function is abnormal. Global function is severely reduced. Nuclear stress EF: 23 %. End diastolic cavity size is moderately enlarged. End systolic cavity size is moderately enlarged.    High risk study with calculated LVEF of 23% and moderate LV chamber dilatation.  Possibility of anteroseptal scar with mild apical ischemia raises possibility of ischemic cardiomyopathy.  Suggest echocardiogram for further evaluation of cardiac structure and function.       Assessment and Plan   1.Chronic systolic HF - new diagnosis, patient with preop testing for TCAR and found to have decreased LVEF -side effects on toprol, tolerating  bisoprolol - issues with high K on higher losartan dosing, have not chagned to entresto however will try low dose aldactone 12.5mg  daily with repeat bmet 1 week. Potassium during recent admission was normal - consider SGLT2i in the near future, he reports he was told would cost him 45$ which would be too much for him. Would need to clarify in future if able to get at a better price - cath remains on hold given recent SDH, if cleared by neurosurgery would reschedule   F/u early Jan 2024     Antoine Poche, M.D., F.A.C.C.

## 2021-12-14 DIAGNOSIS — S065XAA Traumatic subdural hemorrhage with loss of consciousness status unknown, initial encounter: Secondary | ICD-10-CM | POA: Diagnosis not present

## 2021-12-14 DIAGNOSIS — S069XAA Unspecified intracranial injury with loss of consciousness status unknown, initial encounter: Secondary | ICD-10-CM | POA: Diagnosis not present

## 2021-12-14 DIAGNOSIS — E1165 Type 2 diabetes mellitus with hyperglycemia: Secondary | ICD-10-CM | POA: Diagnosis not present

## 2021-12-14 DIAGNOSIS — E1129 Type 2 diabetes mellitus with other diabetic kidney complication: Secondary | ICD-10-CM | POA: Diagnosis not present

## 2021-12-14 DIAGNOSIS — S91109A Unspecified open wound of unspecified toe(s) without damage to nail, initial encounter: Secondary | ICD-10-CM | POA: Diagnosis not present

## 2021-12-14 DIAGNOSIS — I739 Peripheral vascular disease, unspecified: Secondary | ICD-10-CM | POA: Diagnosis not present

## 2021-12-22 ENCOUNTER — Telehealth: Payer: Self-pay | Admitting: Cardiology

## 2021-12-22 DIAGNOSIS — E7849 Other hyperlipidemia: Secondary | ICD-10-CM | POA: Diagnosis not present

## 2021-12-22 DIAGNOSIS — E875 Hyperkalemia: Secondary | ICD-10-CM | POA: Diagnosis not present

## 2021-12-22 DIAGNOSIS — E1142 Type 2 diabetes mellitus with diabetic polyneuropathy: Secondary | ICD-10-CM | POA: Diagnosis not present

## 2021-12-22 NOTE — Telephone Encounter (Signed)
Lab order for BMET faxed to [facility]

## 2021-12-22 NOTE — Telephone Encounter (Signed)
Morrie Sheldon with [facility] states the patient showed up at their office today requesting to have lab work, but he does not have any physical orders with him. She states they will need orders in order for them to draw labs. She is requesting to have them sent ASAP.  Phone#: [phone] Fax#: [phone]

## 2021-12-23 DIAGNOSIS — M6281 Muscle weakness (generalized): Secondary | ICD-10-CM | POA: Diagnosis not present

## 2021-12-23 DIAGNOSIS — S069XAD Unspecified intracranial injury with loss of consciousness status unknown, subsequent encounter: Secondary | ICD-10-CM | POA: Diagnosis not present

## 2021-12-23 DIAGNOSIS — R2681 Unsteadiness on feet: Secondary | ICD-10-CM | POA: Diagnosis not present

## 2021-12-29 DIAGNOSIS — R55 Syncope and collapse: Secondary | ICD-10-CM | POA: Diagnosis not present

## 2021-12-29 DIAGNOSIS — S065XAA Traumatic subdural hemorrhage with loss of consciousness status unknown, initial encounter: Secondary | ICD-10-CM | POA: Diagnosis not present

## 2021-12-29 DIAGNOSIS — Z8673 Personal history of transient ischemic attack (TIA), and cerebral infarction without residual deficits: Secondary | ICD-10-CM | POA: Diagnosis not present

## 2021-12-29 DIAGNOSIS — I6381 Other cerebral infarction due to occlusion or stenosis of small artery: Secondary | ICD-10-CM | POA: Diagnosis not present

## 2021-12-29 DIAGNOSIS — G9389 Other specified disorders of brain: Secondary | ICD-10-CM | POA: Diagnosis not present

## 2021-12-30 DIAGNOSIS — R2681 Unsteadiness on feet: Secondary | ICD-10-CM | POA: Diagnosis not present

## 2021-12-30 DIAGNOSIS — M6281 Muscle weakness (generalized): Secondary | ICD-10-CM | POA: Diagnosis not present

## 2021-12-30 DIAGNOSIS — S069XAD Unspecified intracranial injury with loss of consciousness status unknown, subsequent encounter: Secondary | ICD-10-CM | POA: Diagnosis not present

## 2021-12-31 DIAGNOSIS — M545 Low back pain, unspecified: Secondary | ICD-10-CM | POA: Diagnosis not present

## 2021-12-31 DIAGNOSIS — R609 Edema, unspecified: Secondary | ICD-10-CM | POA: Diagnosis not present

## 2021-12-31 DIAGNOSIS — F17221 Nicotine dependence, chewing tobacco, in remission: Secondary | ICD-10-CM | POA: Diagnosis not present

## 2021-12-31 DIAGNOSIS — E875 Hyperkalemia: Secondary | ICD-10-CM | POA: Diagnosis not present

## 2021-12-31 DIAGNOSIS — E1122 Type 2 diabetes mellitus with diabetic chronic kidney disease: Secondary | ICD-10-CM | POA: Diagnosis not present

## 2021-12-31 DIAGNOSIS — E1129 Type 2 diabetes mellitus with other diabetic kidney complication: Secondary | ICD-10-CM | POA: Diagnosis not present

## 2021-12-31 DIAGNOSIS — E782 Mixed hyperlipidemia: Secondary | ICD-10-CM | POA: Diagnosis not present

## 2021-12-31 DIAGNOSIS — I739 Peripheral vascular disease, unspecified: Secondary | ICD-10-CM | POA: Diagnosis not present

## 2021-12-31 DIAGNOSIS — E0822 Diabetes mellitus due to underlying condition with diabetic chronic kidney disease: Secondary | ICD-10-CM | POA: Diagnosis not present

## 2021-12-31 DIAGNOSIS — E7849 Other hyperlipidemia: Secondary | ICD-10-CM | POA: Diagnosis not present

## 2021-12-31 DIAGNOSIS — E1165 Type 2 diabetes mellitus with hyperglycemia: Secondary | ICD-10-CM | POA: Diagnosis not present

## 2021-12-31 DIAGNOSIS — Z23 Encounter for immunization: Secondary | ICD-10-CM | POA: Diagnosis not present

## 2021-12-31 DIAGNOSIS — I10 Essential (primary) hypertension: Secondary | ICD-10-CM | POA: Diagnosis not present

## 2021-12-31 DIAGNOSIS — E1140 Type 2 diabetes mellitus with diabetic neuropathy, unspecified: Secondary | ICD-10-CM | POA: Diagnosis not present

## 2022-01-05 DIAGNOSIS — M6281 Muscle weakness (generalized): Secondary | ICD-10-CM | POA: Diagnosis not present

## 2022-01-05 DIAGNOSIS — R2681 Unsteadiness on feet: Secondary | ICD-10-CM | POA: Diagnosis not present

## 2022-01-05 DIAGNOSIS — S069XAD Unspecified intracranial injury with loss of consciousness status unknown, subsequent encounter: Secondary | ICD-10-CM | POA: Diagnosis not present

## 2022-01-08 ENCOUNTER — Ambulatory Visit: Payer: Medicare Other | Admitting: Cardiology

## 2022-01-14 DIAGNOSIS — S065XAA Traumatic subdural hemorrhage with loss of consciousness status unknown, initial encounter: Secondary | ICD-10-CM | POA: Diagnosis not present

## 2022-01-15 DIAGNOSIS — E875 Hyperkalemia: Secondary | ICD-10-CM | POA: Diagnosis not present

## 2022-01-21 DIAGNOSIS — I110 Hypertensive heart disease with heart failure: Secondary | ICD-10-CM | POA: Diagnosis not present

## 2022-01-21 DIAGNOSIS — Z7985 Long-term (current) use of injectable non-insulin antidiabetic drugs: Secondary | ICD-10-CM | POA: Diagnosis not present

## 2022-01-21 DIAGNOSIS — S81802D Unspecified open wound, left lower leg, subsequent encounter: Secondary | ICD-10-CM | POA: Diagnosis not present

## 2022-01-21 DIAGNOSIS — E785 Hyperlipidemia, unspecified: Secondary | ICD-10-CM | POA: Diagnosis not present

## 2022-01-21 DIAGNOSIS — I890 Lymphedema, not elsewhere classified: Secondary | ICD-10-CM | POA: Diagnosis not present

## 2022-01-21 DIAGNOSIS — I739 Peripheral vascular disease, unspecified: Secondary | ICD-10-CM | POA: Diagnosis not present

## 2022-01-21 DIAGNOSIS — E119 Type 2 diabetes mellitus without complications: Secondary | ICD-10-CM | POA: Diagnosis not present

## 2022-01-21 DIAGNOSIS — Z794 Long term (current) use of insulin: Secondary | ICD-10-CM | POA: Diagnosis not present

## 2022-01-21 DIAGNOSIS — Z882 Allergy status to sulfonamides status: Secondary | ICD-10-CM | POA: Diagnosis not present

## 2022-01-21 DIAGNOSIS — I509 Heart failure, unspecified: Secondary | ICD-10-CM | POA: Diagnosis not present

## 2022-01-28 DIAGNOSIS — S81802D Unspecified open wound, left lower leg, subsequent encounter: Secondary | ICD-10-CM | POA: Diagnosis not present

## 2022-01-28 DIAGNOSIS — Z794 Long term (current) use of insulin: Secondary | ICD-10-CM | POA: Diagnosis not present

## 2022-01-28 DIAGNOSIS — S81801D Unspecified open wound, right lower leg, subsequent encounter: Secondary | ICD-10-CM | POA: Diagnosis not present

## 2022-01-28 DIAGNOSIS — E119 Type 2 diabetes mellitus without complications: Secondary | ICD-10-CM | POA: Diagnosis not present

## 2022-01-28 DIAGNOSIS — Z882 Allergy status to sulfonamides status: Secondary | ICD-10-CM | POA: Diagnosis not present

## 2022-01-28 DIAGNOSIS — I509 Heart failure, unspecified: Secondary | ICD-10-CM | POA: Diagnosis not present

## 2022-01-28 DIAGNOSIS — E785 Hyperlipidemia, unspecified: Secondary | ICD-10-CM | POA: Diagnosis not present

## 2022-01-28 DIAGNOSIS — Z7985 Long-term (current) use of injectable non-insulin antidiabetic drugs: Secondary | ICD-10-CM | POA: Diagnosis not present

## 2022-01-28 DIAGNOSIS — I110 Hypertensive heart disease with heart failure: Secondary | ICD-10-CM | POA: Diagnosis not present

## 2022-01-28 DIAGNOSIS — I890 Lymphedema, not elsewhere classified: Secondary | ICD-10-CM | POA: Diagnosis not present

## 2022-01-29 DIAGNOSIS — I5020 Unspecified systolic (congestive) heart failure: Secondary | ICD-10-CM | POA: Diagnosis not present

## 2022-01-30 DIAGNOSIS — E785 Hyperlipidemia, unspecified: Secondary | ICD-10-CM | POA: Diagnosis not present

## 2022-01-30 DIAGNOSIS — I890 Lymphedema, not elsewhere classified: Secondary | ICD-10-CM | POA: Diagnosis not present

## 2022-01-30 DIAGNOSIS — S81802D Unspecified open wound, left lower leg, subsequent encounter: Secondary | ICD-10-CM | POA: Diagnosis not present

## 2022-01-30 DIAGNOSIS — Z7985 Long-term (current) use of injectable non-insulin antidiabetic drugs: Secondary | ICD-10-CM | POA: Diagnosis not present

## 2022-01-30 DIAGNOSIS — E119 Type 2 diabetes mellitus without complications: Secondary | ICD-10-CM | POA: Diagnosis not present

## 2022-01-30 DIAGNOSIS — I509 Heart failure, unspecified: Secondary | ICD-10-CM | POA: Diagnosis not present

## 2022-01-30 DIAGNOSIS — I110 Hypertensive heart disease with heart failure: Secondary | ICD-10-CM | POA: Diagnosis not present

## 2022-01-30 DIAGNOSIS — Z882 Allergy status to sulfonamides status: Secondary | ICD-10-CM | POA: Diagnosis not present

## 2022-01-30 DIAGNOSIS — Z794 Long term (current) use of insulin: Secondary | ICD-10-CM | POA: Diagnosis not present

## 2022-02-02 DIAGNOSIS — E119 Type 2 diabetes mellitus without complications: Secondary | ICD-10-CM | POA: Diagnosis not present

## 2022-02-02 DIAGNOSIS — I509 Heart failure, unspecified: Secondary | ICD-10-CM | POA: Diagnosis not present

## 2022-02-02 DIAGNOSIS — Z7985 Long-term (current) use of injectable non-insulin antidiabetic drugs: Secondary | ICD-10-CM | POA: Diagnosis not present

## 2022-02-02 DIAGNOSIS — Z882 Allergy status to sulfonamides status: Secondary | ICD-10-CM | POA: Diagnosis not present

## 2022-02-02 DIAGNOSIS — I890 Lymphedema, not elsewhere classified: Secondary | ICD-10-CM | POA: Diagnosis not present

## 2022-02-02 DIAGNOSIS — I110 Hypertensive heart disease with heart failure: Secondary | ICD-10-CM | POA: Diagnosis not present

## 2022-02-02 DIAGNOSIS — S81802D Unspecified open wound, left lower leg, subsequent encounter: Secondary | ICD-10-CM | POA: Diagnosis not present

## 2022-02-02 DIAGNOSIS — E785 Hyperlipidemia, unspecified: Secondary | ICD-10-CM | POA: Diagnosis not present

## 2022-02-02 DIAGNOSIS — Z794 Long term (current) use of insulin: Secondary | ICD-10-CM | POA: Diagnosis not present

## 2022-02-04 DIAGNOSIS — Z794 Long term (current) use of insulin: Secondary | ICD-10-CM | POA: Diagnosis not present

## 2022-02-04 DIAGNOSIS — E119 Type 2 diabetes mellitus without complications: Secondary | ICD-10-CM | POA: Diagnosis not present

## 2022-02-04 DIAGNOSIS — I509 Heart failure, unspecified: Secondary | ICD-10-CM | POA: Diagnosis not present

## 2022-02-04 DIAGNOSIS — S81802D Unspecified open wound, left lower leg, subsequent encounter: Secondary | ICD-10-CM | POA: Diagnosis not present

## 2022-02-04 DIAGNOSIS — E785 Hyperlipidemia, unspecified: Secondary | ICD-10-CM | POA: Diagnosis not present

## 2022-02-04 DIAGNOSIS — I110 Hypertensive heart disease with heart failure: Secondary | ICD-10-CM | POA: Diagnosis not present

## 2022-02-04 DIAGNOSIS — Z7985 Long-term (current) use of injectable non-insulin antidiabetic drugs: Secondary | ICD-10-CM | POA: Diagnosis not present

## 2022-02-04 DIAGNOSIS — Z882 Allergy status to sulfonamides status: Secondary | ICD-10-CM | POA: Diagnosis not present

## 2022-02-04 DIAGNOSIS — S81801D Unspecified open wound, right lower leg, subsequent encounter: Secondary | ICD-10-CM | POA: Diagnosis not present

## 2022-02-04 DIAGNOSIS — I890 Lymphedema, not elsewhere classified: Secondary | ICD-10-CM | POA: Diagnosis not present

## 2022-02-06 DIAGNOSIS — S81802D Unspecified open wound, left lower leg, subsequent encounter: Secondary | ICD-10-CM | POA: Diagnosis not present

## 2022-02-06 DIAGNOSIS — E119 Type 2 diabetes mellitus without complications: Secondary | ICD-10-CM | POA: Diagnosis not present

## 2022-02-06 DIAGNOSIS — I890 Lymphedema, not elsewhere classified: Secondary | ICD-10-CM | POA: Diagnosis not present

## 2022-02-06 DIAGNOSIS — Z882 Allergy status to sulfonamides status: Secondary | ICD-10-CM | POA: Diagnosis not present

## 2022-02-06 DIAGNOSIS — Z7985 Long-term (current) use of injectable non-insulin antidiabetic drugs: Secondary | ICD-10-CM | POA: Diagnosis not present

## 2022-02-06 DIAGNOSIS — Z794 Long term (current) use of insulin: Secondary | ICD-10-CM | POA: Diagnosis not present

## 2022-02-06 DIAGNOSIS — E785 Hyperlipidemia, unspecified: Secondary | ICD-10-CM | POA: Diagnosis not present

## 2022-02-06 DIAGNOSIS — I509 Heart failure, unspecified: Secondary | ICD-10-CM | POA: Diagnosis not present

## 2022-02-06 DIAGNOSIS — I110 Hypertensive heart disease with heart failure: Secondary | ICD-10-CM | POA: Diagnosis not present

## 2022-02-11 DIAGNOSIS — Z794 Long term (current) use of insulin: Secondary | ICD-10-CM | POA: Diagnosis not present

## 2022-02-11 DIAGNOSIS — I110 Hypertensive heart disease with heart failure: Secondary | ICD-10-CM | POA: Diagnosis not present

## 2022-02-11 DIAGNOSIS — I890 Lymphedema, not elsewhere classified: Secondary | ICD-10-CM | POA: Diagnosis not present

## 2022-02-11 DIAGNOSIS — S81801D Unspecified open wound, right lower leg, subsequent encounter: Secondary | ICD-10-CM | POA: Diagnosis not present

## 2022-02-11 DIAGNOSIS — Z882 Allergy status to sulfonamides status: Secondary | ICD-10-CM | POA: Diagnosis not present

## 2022-02-11 DIAGNOSIS — Z7985 Long-term (current) use of injectable non-insulin antidiabetic drugs: Secondary | ICD-10-CM | POA: Diagnosis not present

## 2022-02-11 DIAGNOSIS — E785 Hyperlipidemia, unspecified: Secondary | ICD-10-CM | POA: Diagnosis not present

## 2022-02-11 DIAGNOSIS — I509 Heart failure, unspecified: Secondary | ICD-10-CM | POA: Diagnosis not present

## 2022-02-11 DIAGNOSIS — S81802D Unspecified open wound, left lower leg, subsequent encounter: Secondary | ICD-10-CM | POA: Diagnosis not present

## 2022-02-11 DIAGNOSIS — E119 Type 2 diabetes mellitus without complications: Secondary | ICD-10-CM | POA: Diagnosis not present

## 2022-02-17 DIAGNOSIS — F17210 Nicotine dependence, cigarettes, uncomplicated: Secondary | ICD-10-CM | POA: Diagnosis not present

## 2022-02-17 DIAGNOSIS — R030 Elevated blood-pressure reading, without diagnosis of hypertension: Secondary | ICD-10-CM | POA: Diagnosis not present

## 2022-02-17 DIAGNOSIS — R0609 Other forms of dyspnea: Secondary | ICD-10-CM | POA: Diagnosis not present

## 2022-02-17 DIAGNOSIS — R609 Edema, unspecified: Secondary | ICD-10-CM | POA: Diagnosis not present

## 2022-02-17 DIAGNOSIS — R5383 Other fatigue: Secondary | ICD-10-CM | POA: Diagnosis not present

## 2022-02-17 DIAGNOSIS — E0822 Diabetes mellitus due to underlying condition with diabetic chronic kidney disease: Secondary | ICD-10-CM | POA: Diagnosis not present

## 2022-02-17 DIAGNOSIS — L03119 Cellulitis of unspecified part of limb: Secondary | ICD-10-CM | POA: Diagnosis not present

## 2022-02-18 DIAGNOSIS — Z7985 Long-term (current) use of injectable non-insulin antidiabetic drugs: Secondary | ICD-10-CM | POA: Diagnosis not present

## 2022-02-18 DIAGNOSIS — Z794 Long term (current) use of insulin: Secondary | ICD-10-CM | POA: Diagnosis not present

## 2022-02-18 DIAGNOSIS — Z882 Allergy status to sulfonamides status: Secondary | ICD-10-CM | POA: Diagnosis not present

## 2022-02-18 DIAGNOSIS — S81801D Unspecified open wound, right lower leg, subsequent encounter: Secondary | ICD-10-CM | POA: Diagnosis not present

## 2022-02-18 DIAGNOSIS — I890 Lymphedema, not elsewhere classified: Secondary | ICD-10-CM | POA: Diagnosis not present

## 2022-02-18 DIAGNOSIS — S81802D Unspecified open wound, left lower leg, subsequent encounter: Secondary | ICD-10-CM | POA: Diagnosis not present

## 2022-02-18 DIAGNOSIS — I509 Heart failure, unspecified: Secondary | ICD-10-CM | POA: Diagnosis not present

## 2022-02-18 DIAGNOSIS — E785 Hyperlipidemia, unspecified: Secondary | ICD-10-CM | POA: Diagnosis not present

## 2022-02-18 DIAGNOSIS — I110 Hypertensive heart disease with heart failure: Secondary | ICD-10-CM | POA: Diagnosis not present

## 2022-02-18 DIAGNOSIS — E119 Type 2 diabetes mellitus without complications: Secondary | ICD-10-CM | POA: Diagnosis not present

## 2022-02-20 ENCOUNTER — Ambulatory Visit: Payer: Medicare Other | Admitting: Cardiology

## 2022-02-20 ENCOUNTER — Encounter: Payer: Self-pay | Admitting: *Deleted

## 2022-02-20 DIAGNOSIS — I6200 Nontraumatic subdural hemorrhage, unspecified: Secondary | ICD-10-CM | POA: Diagnosis not present

## 2022-02-20 DIAGNOSIS — I739 Peripheral vascular disease, unspecified: Secondary | ICD-10-CM | POA: Diagnosis not present

## 2022-02-20 DIAGNOSIS — S065XAA Traumatic subdural hemorrhage with loss of consciousness status unknown, initial encounter: Secondary | ICD-10-CM | POA: Diagnosis not present

## 2022-02-23 ENCOUNTER — Encounter: Payer: Self-pay | Admitting: Cardiology

## 2022-02-23 ENCOUNTER — Encounter: Payer: Self-pay | Admitting: *Deleted

## 2022-02-23 ENCOUNTER — Ambulatory Visit: Payer: Medicare Other | Attending: Cardiology | Admitting: Cardiology

## 2022-02-23 VITALS — BP 124/80 | HR 68 | Ht 68.0 in | Wt 238.0 lb

## 2022-02-23 DIAGNOSIS — S065XAA Traumatic subdural hemorrhage with loss of consciousness status unknown, initial encounter: Secondary | ICD-10-CM | POA: Diagnosis not present

## 2022-02-23 DIAGNOSIS — I5022 Chronic systolic (congestive) heart failure: Secondary | ICD-10-CM

## 2022-02-23 MED ORDER — ASPIRIN 81 MG PO TBEC: 81.0000 mg | DELAYED_RELEASE_TABLET | Freq: Every day | ORAL | Status: DC

## 2022-02-23 NOTE — Patient Instructions (Addendum)
Medication Instructions:  Begin Aspirin 81mg  daily  Continue all other medications.     Labwork: none  Testing/Procedures: none  Follow-Up: 3 months   Any Other Special Instructions Will Be Listed Below (If Applicable). Jardiance patient assistance info given today.   If you need a refill on your cardiac medications before your next appointment, please call your pharmacy.

## 2022-02-23 NOTE — Progress Notes (Signed)
Clinical Summary Mr. Allston is a 72 y.o.male seen today for follow up of the following medical problems.       1.Abnormal stress test/Systolic dysfunction - ordered for preop for TCAR - 04/2021 nuclear stress: apical to basal anteroseptal scar with very mild ishcemia toward apex. Likely inferior artifact but cannot exclude component of scar. LVEF 23% - 05/2021 echo: poor visualization, LVEF roughly 30-40%. Grade III dd.  - 06/2021 echo with contrast: LVEF 35%, global hypokiensis     - some SOB with activities with SOB,though still can do elliptical up 1 hour.  - no chest pains - he reports high K on higher doses of losartan, pcp lowered dose   - last visit started toprol 25mg  daily. Had some SOB and stopped taking. Does have some smoking history.      -tolerating bisoprolol. He is on losartan low dose, some high K on higher doses in the past - home bp's 110s.  - he priced jardiance at $45     - high potassium on aldactone, went up to 6.  -does not want to do heart catherization - appears per neurosurg would not be able to be on plavix regardless.  - lasix was increased to lasix 40mg , fluid. Wt had gone up to 250 lbs, baseline is 235 lbs.        2. 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  - at d/c plan for repeat CT in 2 weeks, continue to hold ASA and plavix until f/u - has f/u Dec 01/14/22, CT few days before - remains off ASA and plavix      - ok to start ASA, but not plavix per neurosurgery according to family report - they have f/u again with them today, will confirm these recs     Other medical issues not addressed this visit   2.PAD - multiple prior interventions Left SFA-pop stenting by Dr. Trula Slade Right great toe ray amputation by Dr. Donnetta Hutching Right SFA atherectomy and stenting by Dr. Oneida Alar     3. Carotid stenosis bilateral carotid duplex ultrasound demonstrated greater  than 80% stenosis of the right internal carotid artery.  He had a follow-up CT a demonstrating roughly 80% stenosis   4.Preoperative evaluation - considering TCAR - no chest pain, no SOB/DOE - goes to [facility] daily, does 60 minutes ellipitical.At least 5 days a week.      Past Medical History:  Diagnosis Date   Arthritis    "hands" (12/27/2012)   High cholesterol    Hypertension    Neuropathic pain    PAD (peripheral artery disease) ([place])    Psoriasis    Stroke ([place]) 1999   "still have some speech problems at times; sometimes forget what I was going to say" (12/27/2012)   Type II diabetes mellitus (HCC)    Ulcer    Left ankle/leg     Allergies  Allergen Reactions   Oxycodone Nausea And Vomiting   Glipizide     Bad headache and blurred vision   Bactrim [Sulfamethoxazole-Trimethoprim] Itching and Rash     Current Outpatient Medications  Medication Sig Dispense Refill   acetaminophen (TYLENOL) 325 MG tablet Take 2 tablets (650 mg total) by mouth 4 (four) times daily -  with meals and at bedtime. 100 tablet 0   atorvastatin (LIPITOR) 20 MG tablet Take 1 tablet (20 mg total) by mouth at bedtime. Too soon to refill  30 tablet 0   bisoprolol (ZEBETA) 5 MG tablet Take 0.5 tablets (2.5 mg total) by mouth daily. 30 tablet 0   Dulaglutide (TRULICITY) 1.5 BB/0.4UG SOPN Inject 1.5 mg into the skin once a week.     fluticasone (FLONASE) 50 MCG/ACT nasal spray Place 2 sprays into both nostrils daily as needed for allergies.     furosemide (LASIX) 20 MG tablet Take 1 tablet (20 mg total) by mouth daily. 30 tablet 0   gabapentin (NEURONTIN) 600 MG tablet Take 1 tablet (600 mg total) by mouth every evening. 30 tablet 0   losartan (COZAAR) 25 MG tablet Take 0.5 tablets (12.5 mg total) by mouth daily. Too soon to refill 30 tablet 0   methocarbamol (ROBAXIN) 750 MG tablet Take 1 tablet (750 mg total) by mouth every 8 (eight) hours. 90 tablet 0   NOVOLIN 70/30 RELION (70-30) 100 UNIT/ML injection  Inject 16 Units into the skin every evening. 5 mL 12   Podiatric Products (GOLD BOND FOOT) CREA Apply 1 application topically 2 (two) times daily as needed (DRY SKIN). DIABETIC CREAM     polyethylene glycol powder (GLYCOLAX/MIRALAX) 17 GM/SCOOP powder Take 17 g by mouth daily. 238 g 0   senna-docusate (SENOKOT-S) 8.6-50 MG tablet Take 1 tablet by mouth 2 (two) times daily. 60 tablet 0   spironolactone (ALDACTONE) 25 MG tablet Take 0.5 tablets (12.5 mg total) by mouth daily. 45 tablet 3   traZODone (DESYREL) 50 MG tablet Take 0.5 tablets (25 mg total) by mouth at bedtime as needed for sleep. 15 tablet 0   triamcinolone cream (KENALOG) 0.1 % Apply 1 Application topically 2 (two) times daily. Mix with OTC eucerin 1:1. 120 g 0   No current facility-administered medications for this visit.     Past Surgical History:  Procedure Laterality Date   ABDOMINAL AORTAGRAM N/A 03/30/2012   Procedure: ABDOMINAL AORTAGRAM;  Surgeon: Serafina Mitchell, MD;  Location: [facility] CATH LAB;  Service: Cardiovascular;  Laterality: N/A;   ABDOMINAL AORTAGRAM N/A 12/27/2012   Procedure: ABDOMINAL Maxcine Ham;  Surgeon: Serafina Mitchell, MD;  Location: [facility] CATH LAB;  Service: Cardiovascular;  Laterality: N/A;   ABDOMINAL AORTOGRAM W/LOWER EXTREMITY N/A 04/05/2017   Procedure: ABDOMINAL AORTOGRAM W/LOWER EXTREMITY;  Surgeon: Elam Dutch, MD;  Location: [place] CV LAB;  Service: Cardiovascular;  Laterality: N/A;   AMPUTATION Right 04/26/2017   Procedure: AMPUTATION TRANSMETATARSAL RIGHT GREAT TOE;  Surgeon: Rosetta Posner, MD;  Location: [facility] OR;  Service: Vascular;  Laterality: Right;   ANGIOPLASTY / STENTING FEMORAL Left 89/01/6943   APPLICATION OF WOUND VAC Right 04/26/2017   Procedure: APPLICATION OF WOUND VAC;  Surgeon: Rosetta Posner, MD;  Location: [place];  Service: Vascular;  Laterality: Right;   FEMORAL ARTERY STENT  03/30/2012   LOWER EXTREMITY ANGIOGRAM  12/27/2012   Procedure: LOWER EXTREMITY ANGIOGRAM;  Surgeon: Serafina Mitchell, MD;  Location: [facility] CATH LAB;  Service: Cardiovascular;;   LOWER EXTREMITY ANGIOGRAPHY N/A 04/19/2018   Procedure: LOWER EXTREMITY ANGIOGRAPHY;  Surgeon: Serafina Mitchell, MD;  Location: [place] CV LAB;  Service: Cardiovascular;  Laterality: N/A;   PERIPHERAL VASCULAR ATHERECTOMY Right 04/05/2017   Procedure: PERIPHERAL VASCULAR ATHERECTOMY;  Surgeon: Elam Dutch, MD;  Location: [place] CV LAB;  Service: Cardiovascular;  Laterality: Right;  superficial femoral   PERIPHERAL VASCULAR BALLOON ANGIOPLASTY  04/19/2018   Procedure: PERIPHERAL VASCULAR BALLOON ANGIOPLASTY;  Surgeon: Serafina Mitchell, MD;  Location: [place] CV LAB;  Service: Cardiovascular;;  PERIPHERAL VASCULAR INTERVENTION Right 04/05/2017   Procedure: PERIPHERAL VASCULAR INTERVENTION;  Surgeon: Sherren Kerns, MD;  Location: [facility] INVASIVE CV LAB;  Service: Cardiovascular;  Laterality: Right;   Superficial femorl and external iliac   POPLITEAL ARTERY STENT     TONSILLECTOMY AND ADENOIDECTOMY       Allergies  Allergen Reactions   Oxycodone Nausea And Vomiting   Glipizide     Bad headache and blurred vision   Bactrim [Sulfamethoxazole-Trimethoprim] Itching and Rash      Family History  Problem Relation Age of Onset   Diabetes Sister        Bilateral amputation of lower legs   Diabetes Sister    Heart disease Sister 69       Heart disease before age 68   Hypertension Sister    Heart attack Sister    Hyperlipidemia Sister    Hypertension Father    Hyperlipidemia Father    Hyperlipidemia Mother    Hypertension Mother    Hypertension Daughter      Social History Mr. Huston reports that he quit smoking about 11 years ago. His smoking use included cigarettes. He has a 135.00 pack-year smoking history. He has never been exposed to tobacco smoke. He has never used smokeless tobacco. Mr. Hennick reports that he does not currently use alcohol.   Review of Systems CONSTITUTIONAL: No weight loss,  fever, chills, weakness or fatigue.  HEENT: Eyes: No visual loss, blurred vision, double vision or yellow sclerae.No hearing loss, sneezing, congestion, runny nose or sore throat.  SKIN: No rash or itching.  CARDIOVASCULAR: per hpi RESPIRATORY: No shortness of breath, cough or sputum.  GASTROINTESTINAL: No anorexia, nausea, vomiting or diarrhea. No abdominal pain or blood.  GENITOURINARY: No burning on urination, no polyuria NEUROLOGICAL: No headache, dizziness, syncope, paralysis, ataxia, numbness or tingling in the extremities. No change in bowel or bladder control.  MUSCULOSKELETAL: No muscle, back pain, joint pain or stiffness.  LYMPHATICS: No enlarged nodes. No history of splenectomy.  PSYCHIATRIC: No history of depression or anxiety.  ENDOCRINOLOGIC: No reports of sweating, cold or heat intolerance. No polyuria or polydipsia.  [name]   Physical Examination Today's Vitals   02/23/22 1148  BP: 124/80  Pulse: 68  SpO2: 99%  Weight: 238 lb (108 kg)  Height: 5\' 8"  (1.727 m)   Body mass index is 36.19 kg/m.  Gen: resting comfortably, no acute distress HEENT: no scleral icterus, pupils equal round and reactive, no palptable cervical adenopathy,  CV: RRR, no m/r/g no jvd Resp: Clear to auscultation bilaterally GI: abdomen is soft, non-tender, non-distended, normal bowel sounds, no hepatosplenomegaly MSK: extremities are warm, no edema.  Skin: warm, no rash Neuro:  no focal deficits Psych: appropriate affect   Diagnostic Studies  04/2021 nuclear stress  Findings are consistent with prior myocardial infarction with peri-infarct ischemia. The study is high risk.   No ST deviation was noted. Arrhythmias during stress: rare PACs, rare PVCs. The ECG was negative for ischemia.   LV perfusion is abnormal.  Small, moderate intensity, apical to basal anteroseptal defect that is fixed with the exception of partial reversibility at the very apex.  There is also a small, mild intensity inferior  defect from apex to base that is most prominent on rest imaging and consistent with soft tissue attenuation, although cannot completely exclude component of scar.   Left ventricular function is abnormal. Global function is severely reduced. Nuclear stress EF: 23 %. End diastolic cavity size is moderately enlarged. End  systolic cavity size is moderately enlarged.   High risk study with calculated LVEF of 23% and moderate LV chamber dilatation.  Possibility of anteroseptal scar with mild apical ischemia raises possibility of ischemic cardiomyopathy.  Suggest echocardiogram for further evaluation of cardiac structure and function.     Assessment and Plan  1.Chronic systolic HF - new diagnosis, patient with preop testing for TCAR and found to have decreased LVEF -side effects on toprol, tolerating bisoprolol - issues with high K on higher losartan dosing, have not chagned to entresto - high K with recent aldactone trial, have stopped - consider SGLT2i in the near future, he reports he was told would cost him 45$ which would be too much for him. Will see if can qualify for any assistance.  - could consider hydral/nitrates at f/u - he has turne down cath, also from family report due to SDH neurosurg has recommended against restarting plavix at any point but OK with aspirin alone, we will request records to confirm.      F/u 3 months      Antoine Poche, M.D.,

## 2022-02-25 ENCOUNTER — Encounter: Payer: Self-pay | Admitting: Cardiology

## 2022-02-25 DIAGNOSIS — I890 Lymphedema, not elsewhere classified: Secondary | ICD-10-CM | POA: Diagnosis not present

## 2022-02-25 DIAGNOSIS — I509 Heart failure, unspecified: Secondary | ICD-10-CM | POA: Diagnosis not present

## 2022-02-25 DIAGNOSIS — S81801S Unspecified open wound, right lower leg, sequela: Secondary | ICD-10-CM | POA: Diagnosis not present

## 2022-02-25 DIAGNOSIS — S81801D Unspecified open wound, right lower leg, subsequent encounter: Secondary | ICD-10-CM | POA: Diagnosis not present

## 2022-02-25 DIAGNOSIS — S81002A Unspecified open wound, left knee, initial encounter: Secondary | ICD-10-CM | POA: Diagnosis not present

## 2022-02-25 DIAGNOSIS — I10 Essential (primary) hypertension: Secondary | ICD-10-CM | POA: Diagnosis not present

## 2022-02-25 DIAGNOSIS — I252 Old myocardial infarction: Secondary | ICD-10-CM | POA: Diagnosis not present

## 2022-02-25 DIAGNOSIS — E1165 Type 2 diabetes mellitus with hyperglycemia: Secondary | ICD-10-CM | POA: Diagnosis not present

## 2022-02-25 DIAGNOSIS — E782 Mixed hyperlipidemia: Secondary | ICD-10-CM | POA: Diagnosis not present

## 2022-02-25 DIAGNOSIS — E119 Type 2 diabetes mellitus without complications: Secondary | ICD-10-CM | POA: Diagnosis not present

## 2022-02-25 DIAGNOSIS — Z79899 Other long term (current) drug therapy: Secondary | ICD-10-CM | POA: Diagnosis not present

## 2022-02-25 DIAGNOSIS — N183 Chronic kidney disease, stage 3 unspecified: Secondary | ICD-10-CM | POA: Diagnosis not present

## 2022-02-25 DIAGNOSIS — E785 Hyperlipidemia, unspecified: Secondary | ICD-10-CM | POA: Diagnosis not present

## 2022-02-25 DIAGNOSIS — I110 Hypertensive heart disease with heart failure: Secondary | ICD-10-CM | POA: Diagnosis not present

## 2022-02-25 DIAGNOSIS — Z8673 Personal history of transient ischemic attack (TIA), and cerebral infarction without residual deficits: Secondary | ICD-10-CM | POA: Diagnosis not present

## 2022-02-26 ENCOUNTER — Other Ambulatory Visit: Payer: Self-pay | Admitting: *Deleted

## 2022-02-26 ENCOUNTER — Encounter: Payer: Self-pay | Admitting: *Deleted

## 2022-02-26 MED ORDER — EMPAGLIFLOZIN 10 MG PO TABS: 10.0000 mg | ORAL_TABLET | Freq: Every day | ORAL | 3 refills | Status: DC

## 2022-02-27 DIAGNOSIS — Z79899 Other long term (current) drug therapy: Secondary | ICD-10-CM | POA: Diagnosis not present

## 2022-02-27 DIAGNOSIS — E785 Hyperlipidemia, unspecified: Secondary | ICD-10-CM | POA: Diagnosis not present

## 2022-02-27 DIAGNOSIS — E119 Type 2 diabetes mellitus without complications: Secondary | ICD-10-CM | POA: Diagnosis not present

## 2022-02-27 DIAGNOSIS — I509 Heart failure, unspecified: Secondary | ICD-10-CM | POA: Diagnosis not present

## 2022-02-27 DIAGNOSIS — Z8673 Personal history of transient ischemic attack (TIA), and cerebral infarction without residual deficits: Secondary | ICD-10-CM | POA: Diagnosis not present

## 2022-02-27 DIAGNOSIS — I252 Old myocardial infarction: Secondary | ICD-10-CM | POA: Diagnosis not present

## 2022-02-27 DIAGNOSIS — I110 Hypertensive heart disease with heart failure: Secondary | ICD-10-CM | POA: Diagnosis not present

## 2022-02-27 DIAGNOSIS — S81002A Unspecified open wound, left knee, initial encounter: Secondary | ICD-10-CM | POA: Diagnosis not present

## 2022-02-27 DIAGNOSIS — S81801D Unspecified open wound, right lower leg, subsequent encounter: Secondary | ICD-10-CM | POA: Diagnosis not present

## 2022-03-04 DIAGNOSIS — I509 Heart failure, unspecified: Secondary | ICD-10-CM | POA: Diagnosis not present

## 2022-03-04 DIAGNOSIS — E119 Type 2 diabetes mellitus without complications: Secondary | ICD-10-CM | POA: Diagnosis not present

## 2022-03-04 DIAGNOSIS — Z79899 Other long term (current) drug therapy: Secondary | ICD-10-CM | POA: Diagnosis not present

## 2022-03-04 DIAGNOSIS — S81002A Unspecified open wound, left knee, initial encounter: Secondary | ICD-10-CM | POA: Diagnosis not present

## 2022-03-04 DIAGNOSIS — Z8673 Personal history of transient ischemic attack (TIA), and cerebral infarction without residual deficits: Secondary | ICD-10-CM | POA: Diagnosis not present

## 2022-03-04 DIAGNOSIS — I252 Old myocardial infarction: Secondary | ICD-10-CM | POA: Diagnosis not present

## 2022-03-04 DIAGNOSIS — E785 Hyperlipidemia, unspecified: Secondary | ICD-10-CM | POA: Diagnosis not present

## 2022-03-04 DIAGNOSIS — I110 Hypertensive heart disease with heart failure: Secondary | ICD-10-CM | POA: Diagnosis not present

## 2022-03-04 DIAGNOSIS — S81801D Unspecified open wound, right lower leg, subsequent encounter: Secondary | ICD-10-CM | POA: Diagnosis not present

## 2022-03-06 DIAGNOSIS — E785 Hyperlipidemia, unspecified: Secondary | ICD-10-CM | POA: Diagnosis not present

## 2022-03-06 DIAGNOSIS — Z79899 Other long term (current) drug therapy: Secondary | ICD-10-CM | POA: Diagnosis not present

## 2022-03-06 DIAGNOSIS — Z8673 Personal history of transient ischemic attack (TIA), and cerebral infarction without residual deficits: Secondary | ICD-10-CM | POA: Diagnosis not present

## 2022-03-06 DIAGNOSIS — S81002A Unspecified open wound, left knee, initial encounter: Secondary | ICD-10-CM | POA: Diagnosis not present

## 2022-03-06 DIAGNOSIS — S81801D Unspecified open wound, right lower leg, subsequent encounter: Secondary | ICD-10-CM | POA: Diagnosis not present

## 2022-03-06 DIAGNOSIS — I509 Heart failure, unspecified: Secondary | ICD-10-CM | POA: Diagnosis not present

## 2022-03-06 DIAGNOSIS — I252 Old myocardial infarction: Secondary | ICD-10-CM | POA: Diagnosis not present

## 2022-03-06 DIAGNOSIS — E119 Type 2 diabetes mellitus without complications: Secondary | ICD-10-CM | POA: Diagnosis not present

## 2022-03-06 DIAGNOSIS — I110 Hypertensive heart disease with heart failure: Secondary | ICD-10-CM | POA: Diagnosis not present

## 2022-03-09 DIAGNOSIS — E1151 Type 2 diabetes mellitus with diabetic peripheral angiopathy without gangrene: Secondary | ICD-10-CM | POA: Diagnosis not present

## 2022-03-09 DIAGNOSIS — M79675 Pain in left toe(s): Secondary | ICD-10-CM | POA: Diagnosis not present

## 2022-03-09 DIAGNOSIS — M79672 Pain in left foot: Secondary | ICD-10-CM | POA: Diagnosis not present

## 2022-03-09 DIAGNOSIS — M79671 Pain in right foot: Secondary | ICD-10-CM | POA: Diagnosis not present

## 2022-03-09 DIAGNOSIS — M79674 Pain in right toe(s): Secondary | ICD-10-CM | POA: Diagnosis not present

## 2022-03-09 DIAGNOSIS — L110 Acquired keratosis follicularis: Secondary | ICD-10-CM | POA: Diagnosis not present

## 2022-03-09 DIAGNOSIS — E1140 Type 2 diabetes mellitus with diabetic neuropathy, unspecified: Secondary | ICD-10-CM | POA: Diagnosis not present

## 2022-03-11 DIAGNOSIS — S81002A Unspecified open wound, left knee, initial encounter: Secondary | ICD-10-CM | POA: Diagnosis not present

## 2022-03-11 DIAGNOSIS — E785 Hyperlipidemia, unspecified: Secondary | ICD-10-CM | POA: Diagnosis not present

## 2022-03-11 DIAGNOSIS — S81801D Unspecified open wound, right lower leg, subsequent encounter: Secondary | ICD-10-CM | POA: Diagnosis not present

## 2022-03-11 DIAGNOSIS — I110 Hypertensive heart disease with heart failure: Secondary | ICD-10-CM | POA: Diagnosis not present

## 2022-03-11 DIAGNOSIS — E119 Type 2 diabetes mellitus without complications: Secondary | ICD-10-CM | POA: Diagnosis not present

## 2022-03-11 DIAGNOSIS — Z79899 Other long term (current) drug therapy: Secondary | ICD-10-CM | POA: Diagnosis not present

## 2022-03-11 DIAGNOSIS — I509 Heart failure, unspecified: Secondary | ICD-10-CM | POA: Diagnosis not present

## 2022-03-11 DIAGNOSIS — I252 Old myocardial infarction: Secondary | ICD-10-CM | POA: Diagnosis not present

## 2022-03-11 DIAGNOSIS — Z8673 Personal history of transient ischemic attack (TIA), and cerebral infarction without residual deficits: Secondary | ICD-10-CM | POA: Diagnosis not present

## 2022-03-13 DIAGNOSIS — E119 Type 2 diabetes mellitus without complications: Secondary | ICD-10-CM | POA: Diagnosis not present

## 2022-03-13 DIAGNOSIS — E785 Hyperlipidemia, unspecified: Secondary | ICD-10-CM | POA: Diagnosis not present

## 2022-03-13 DIAGNOSIS — S81002A Unspecified open wound, left knee, initial encounter: Secondary | ICD-10-CM | POA: Diagnosis not present

## 2022-03-13 DIAGNOSIS — S81801D Unspecified open wound, right lower leg, subsequent encounter: Secondary | ICD-10-CM | POA: Diagnosis not present

## 2022-03-13 DIAGNOSIS — I509 Heart failure, unspecified: Secondary | ICD-10-CM | POA: Diagnosis not present

## 2022-03-13 DIAGNOSIS — I252 Old myocardial infarction: Secondary | ICD-10-CM | POA: Diagnosis not present

## 2022-03-13 DIAGNOSIS — Z8673 Personal history of transient ischemic attack (TIA), and cerebral infarction without residual deficits: Secondary | ICD-10-CM | POA: Diagnosis not present

## 2022-03-13 DIAGNOSIS — Z79899 Other long term (current) drug therapy: Secondary | ICD-10-CM | POA: Diagnosis not present

## 2022-03-13 DIAGNOSIS — I110 Hypertensive heart disease with heart failure: Secondary | ICD-10-CM | POA: Diagnosis not present

## 2022-03-19 DIAGNOSIS — N183 Chronic kidney disease, stage 3 unspecified: Secondary | ICD-10-CM | POA: Diagnosis not present

## 2022-03-19 DIAGNOSIS — E7849 Other hyperlipidemia: Secondary | ICD-10-CM | POA: Diagnosis not present

## 2022-03-19 DIAGNOSIS — E1122 Type 2 diabetes mellitus with diabetic chronic kidney disease: Secondary | ICD-10-CM | POA: Diagnosis not present

## 2022-03-19 DIAGNOSIS — E1140 Type 2 diabetes mellitus with diabetic neuropathy, unspecified: Secondary | ICD-10-CM | POA: Diagnosis not present

## 2022-03-19 DIAGNOSIS — N1832 Chronic kidney disease, stage 3b: Secondary | ICD-10-CM | POA: Diagnosis not present

## 2022-03-20 DIAGNOSIS — I252 Old myocardial infarction: Secondary | ICD-10-CM | POA: Diagnosis not present

## 2022-03-20 DIAGNOSIS — E119 Type 2 diabetes mellitus without complications: Secondary | ICD-10-CM | POA: Diagnosis not present

## 2022-03-20 DIAGNOSIS — Z79899 Other long term (current) drug therapy: Secondary | ICD-10-CM | POA: Diagnosis not present

## 2022-03-20 DIAGNOSIS — S81002A Unspecified open wound, left knee, initial encounter: Secondary | ICD-10-CM | POA: Diagnosis not present

## 2022-03-20 DIAGNOSIS — S81801D Unspecified open wound, right lower leg, subsequent encounter: Secondary | ICD-10-CM | POA: Diagnosis not present

## 2022-03-20 DIAGNOSIS — Z8673 Personal history of transient ischemic attack (TIA), and cerebral infarction without residual deficits: Secondary | ICD-10-CM | POA: Diagnosis not present

## 2022-03-20 DIAGNOSIS — I110 Hypertensive heart disease with heart failure: Secondary | ICD-10-CM | POA: Diagnosis not present

## 2022-03-20 DIAGNOSIS — E785 Hyperlipidemia, unspecified: Secondary | ICD-10-CM | POA: Diagnosis not present

## 2022-03-20 DIAGNOSIS — I509 Heart failure, unspecified: Secondary | ICD-10-CM | POA: Diagnosis not present

## 2022-03-25 DIAGNOSIS — F17210 Nicotine dependence, cigarettes, uncomplicated: Secondary | ICD-10-CM | POA: Diagnosis not present

## 2022-03-25 DIAGNOSIS — E0822 Diabetes mellitus due to underlying condition with diabetic chronic kidney disease: Secondary | ICD-10-CM | POA: Diagnosis not present

## 2022-03-25 DIAGNOSIS — L0231 Cutaneous abscess of buttock: Secondary | ICD-10-CM | POA: Diagnosis not present

## 2022-03-25 DIAGNOSIS — L409 Psoriasis, unspecified: Secondary | ICD-10-CM | POA: Diagnosis not present

## 2022-03-25 DIAGNOSIS — R609 Edema, unspecified: Secondary | ICD-10-CM | POA: Diagnosis not present

## 2022-03-25 DIAGNOSIS — E119 Type 2 diabetes mellitus without complications: Secondary | ICD-10-CM | POA: Diagnosis not present

## 2022-03-25 DIAGNOSIS — L8990 Pressure ulcer of unspecified site, unspecified stage: Secondary | ICD-10-CM | POA: Diagnosis not present

## 2022-03-25 DIAGNOSIS — Z7985 Long-term (current) use of injectable non-insulin antidiabetic drugs: Secondary | ICD-10-CM | POA: Diagnosis not present

## 2022-03-25 DIAGNOSIS — L03119 Cellulitis of unspecified part of limb: Secondary | ICD-10-CM | POA: Diagnosis not present

## 2022-03-25 DIAGNOSIS — N1832 Chronic kidney disease, stage 3b: Secondary | ICD-10-CM | POA: Diagnosis not present

## 2022-03-25 DIAGNOSIS — E1165 Type 2 diabetes mellitus with hyperglycemia: Secondary | ICD-10-CM | POA: Diagnosis not present

## 2022-03-25 DIAGNOSIS — I509 Heart failure, unspecified: Secondary | ICD-10-CM | POA: Diagnosis not present

## 2022-03-25 DIAGNOSIS — R5383 Other fatigue: Secondary | ICD-10-CM | POA: Diagnosis not present

## 2022-03-25 DIAGNOSIS — L89312 Pressure ulcer of right buttock, stage 2: Secondary | ICD-10-CM | POA: Diagnosis not present

## 2022-03-25 DIAGNOSIS — I252 Old myocardial infarction: Secondary | ICD-10-CM | POA: Diagnosis not present

## 2022-03-25 DIAGNOSIS — I110 Hypertensive heart disease with heart failure: Secondary | ICD-10-CM | POA: Diagnosis not present

## 2022-03-25 DIAGNOSIS — R030 Elevated blood-pressure reading, without diagnosis of hypertension: Secondary | ICD-10-CM | POA: Diagnosis not present

## 2022-03-25 DIAGNOSIS — Z882 Allergy status to sulfonamides status: Secondary | ICD-10-CM | POA: Diagnosis not present

## 2022-03-25 DIAGNOSIS — I890 Lymphedema, not elsewhere classified: Secondary | ICD-10-CM | POA: Diagnosis not present

## 2022-03-25 DIAGNOSIS — E785 Hyperlipidemia, unspecified: Secondary | ICD-10-CM | POA: Diagnosis not present

## 2022-03-25 DIAGNOSIS — L89322 Pressure ulcer of left buttock, stage 2: Secondary | ICD-10-CM | POA: Diagnosis not present

## 2022-03-25 DIAGNOSIS — R0609 Other forms of dyspnea: Secondary | ICD-10-CM | POA: Diagnosis not present

## 2022-03-26 DIAGNOSIS — I10 Essential (primary) hypertension: Secondary | ICD-10-CM | POA: Diagnosis not present

## 2022-03-26 DIAGNOSIS — E782 Mixed hyperlipidemia: Secondary | ICD-10-CM | POA: Diagnosis not present

## 2022-03-26 DIAGNOSIS — N183 Chronic kidney disease, stage 3 unspecified: Secondary | ICD-10-CM | POA: Diagnosis not present

## 2022-03-26 DIAGNOSIS — E1165 Type 2 diabetes mellitus with hyperglycemia: Secondary | ICD-10-CM | POA: Diagnosis not present

## 2022-03-31 DIAGNOSIS — E785 Hyperlipidemia, unspecified: Secondary | ICD-10-CM | POA: Diagnosis not present

## 2022-03-31 DIAGNOSIS — I252 Old myocardial infarction: Secondary | ICD-10-CM | POA: Diagnosis not present

## 2022-03-31 DIAGNOSIS — L89322 Pressure ulcer of left buttock, stage 2: Secondary | ICD-10-CM | POA: Diagnosis not present

## 2022-03-31 DIAGNOSIS — Z7985 Long-term (current) use of injectable non-insulin antidiabetic drugs: Secondary | ICD-10-CM | POA: Diagnosis not present

## 2022-03-31 DIAGNOSIS — L89312 Pressure ulcer of right buttock, stage 2: Secondary | ICD-10-CM | POA: Diagnosis not present

## 2022-03-31 DIAGNOSIS — I890 Lymphedema, not elsewhere classified: Secondary | ICD-10-CM | POA: Diagnosis not present

## 2022-03-31 DIAGNOSIS — I110 Hypertensive heart disease with heart failure: Secondary | ICD-10-CM | POA: Diagnosis not present

## 2022-03-31 DIAGNOSIS — E119 Type 2 diabetes mellitus without complications: Secondary | ICD-10-CM | POA: Diagnosis not present

## 2022-03-31 DIAGNOSIS — Z882 Allergy status to sulfonamides status: Secondary | ICD-10-CM | POA: Diagnosis not present

## 2022-03-31 DIAGNOSIS — I509 Heart failure, unspecified: Secondary | ICD-10-CM | POA: Diagnosis not present

## 2022-04-03 DIAGNOSIS — E785 Hyperlipidemia, unspecified: Secondary | ICD-10-CM | POA: Diagnosis not present

## 2022-04-03 DIAGNOSIS — L89312 Pressure ulcer of right buttock, stage 2: Secondary | ICD-10-CM | POA: Diagnosis not present

## 2022-04-03 DIAGNOSIS — E119 Type 2 diabetes mellitus without complications: Secondary | ICD-10-CM | POA: Diagnosis not present

## 2022-04-03 DIAGNOSIS — Z7985 Long-term (current) use of injectable non-insulin antidiabetic drugs: Secondary | ICD-10-CM | POA: Diagnosis not present

## 2022-04-03 DIAGNOSIS — L89322 Pressure ulcer of left buttock, stage 2: Secondary | ICD-10-CM | POA: Diagnosis not present

## 2022-04-03 DIAGNOSIS — I890 Lymphedema, not elsewhere classified: Secondary | ICD-10-CM | POA: Diagnosis not present

## 2022-04-03 DIAGNOSIS — Z882 Allergy status to sulfonamides status: Secondary | ICD-10-CM | POA: Diagnosis not present

## 2022-04-03 DIAGNOSIS — I252 Old myocardial infarction: Secondary | ICD-10-CM | POA: Diagnosis not present

## 2022-04-03 DIAGNOSIS — I509 Heart failure, unspecified: Secondary | ICD-10-CM | POA: Diagnosis not present

## 2022-04-03 DIAGNOSIS — I110 Hypertensive heart disease with heart failure: Secondary | ICD-10-CM | POA: Diagnosis not present

## 2022-04-08 DIAGNOSIS — Z7985 Long-term (current) use of injectable non-insulin antidiabetic drugs: Secondary | ICD-10-CM | POA: Diagnosis not present

## 2022-04-08 DIAGNOSIS — I110 Hypertensive heart disease with heart failure: Secondary | ICD-10-CM | POA: Diagnosis not present

## 2022-04-08 DIAGNOSIS — I509 Heart failure, unspecified: Secondary | ICD-10-CM | POA: Diagnosis not present

## 2022-04-08 DIAGNOSIS — I890 Lymphedema, not elsewhere classified: Secondary | ICD-10-CM | POA: Diagnosis not present

## 2022-04-08 DIAGNOSIS — E785 Hyperlipidemia, unspecified: Secondary | ICD-10-CM | POA: Diagnosis not present

## 2022-04-08 DIAGNOSIS — S81801S Unspecified open wound, right lower leg, sequela: Secondary | ICD-10-CM | POA: Diagnosis not present

## 2022-04-08 DIAGNOSIS — L89322 Pressure ulcer of left buttock, stage 2: Secondary | ICD-10-CM | POA: Diagnosis not present

## 2022-04-08 DIAGNOSIS — Z882 Allergy status to sulfonamides status: Secondary | ICD-10-CM | POA: Diagnosis not present

## 2022-04-08 DIAGNOSIS — E119 Type 2 diabetes mellitus without complications: Secondary | ICD-10-CM | POA: Diagnosis not present

## 2022-04-08 DIAGNOSIS — L89323 Pressure ulcer of left buttock, stage 3: Secondary | ICD-10-CM | POA: Diagnosis not present

## 2022-04-08 DIAGNOSIS — I252 Old myocardial infarction: Secondary | ICD-10-CM | POA: Diagnosis not present

## 2022-04-08 DIAGNOSIS — L89312 Pressure ulcer of right buttock, stage 2: Secondary | ICD-10-CM | POA: Diagnosis not present

## 2022-04-10 DIAGNOSIS — Z882 Allergy status to sulfonamides status: Secondary | ICD-10-CM | POA: Diagnosis not present

## 2022-04-10 DIAGNOSIS — L89312 Pressure ulcer of right buttock, stage 2: Secondary | ICD-10-CM | POA: Diagnosis not present

## 2022-04-10 DIAGNOSIS — I110 Hypertensive heart disease with heart failure: Secondary | ICD-10-CM | POA: Diagnosis not present

## 2022-04-10 DIAGNOSIS — I890 Lymphedema, not elsewhere classified: Secondary | ICD-10-CM | POA: Diagnosis not present

## 2022-04-10 DIAGNOSIS — L89322 Pressure ulcer of left buttock, stage 2: Secondary | ICD-10-CM | POA: Diagnosis not present

## 2022-04-10 DIAGNOSIS — I252 Old myocardial infarction: Secondary | ICD-10-CM | POA: Diagnosis not present

## 2022-04-10 DIAGNOSIS — Z7985 Long-term (current) use of injectable non-insulin antidiabetic drugs: Secondary | ICD-10-CM | POA: Diagnosis not present

## 2022-04-10 DIAGNOSIS — E119 Type 2 diabetes mellitus without complications: Secondary | ICD-10-CM | POA: Diagnosis not present

## 2022-04-10 DIAGNOSIS — E785 Hyperlipidemia, unspecified: Secondary | ICD-10-CM | POA: Diagnosis not present

## 2022-04-10 DIAGNOSIS — I509 Heart failure, unspecified: Secondary | ICD-10-CM | POA: Diagnosis not present

## 2022-04-13 DIAGNOSIS — E1122 Type 2 diabetes mellitus with diabetic chronic kidney disease: Secondary | ICD-10-CM | POA: Diagnosis not present

## 2022-04-13 DIAGNOSIS — S81802A Unspecified open wound, left lower leg, initial encounter: Secondary | ICD-10-CM | POA: Diagnosis not present

## 2022-04-13 DIAGNOSIS — N3000 Acute cystitis without hematuria: Secondary | ICD-10-CM | POA: Diagnosis not present

## 2022-04-13 DIAGNOSIS — S81802D Unspecified open wound, left lower leg, subsequent encounter: Secondary | ICD-10-CM | POA: Diagnosis not present

## 2022-04-13 DIAGNOSIS — E1142 Type 2 diabetes mellitus with diabetic polyneuropathy: Secondary | ICD-10-CM | POA: Diagnosis not present

## 2022-04-13 DIAGNOSIS — I739 Peripheral vascular disease, unspecified: Secondary | ICD-10-CM | POA: Diagnosis not present

## 2022-04-13 DIAGNOSIS — I10 Essential (primary) hypertension: Secondary | ICD-10-CM | POA: Diagnosis not present

## 2022-04-13 DIAGNOSIS — E1121 Type 2 diabetes mellitus with diabetic nephropathy: Secondary | ICD-10-CM | POA: Diagnosis not present

## 2022-04-13 DIAGNOSIS — Z1159 Encounter for screening for other viral diseases: Secondary | ICD-10-CM | POA: Diagnosis not present

## 2022-04-13 DIAGNOSIS — I2510 Atherosclerotic heart disease of native coronary artery without angina pectoris: Secondary | ICD-10-CM | POA: Diagnosis not present

## 2022-04-13 DIAGNOSIS — S81801A Unspecified open wound, right lower leg, initial encounter: Secondary | ICD-10-CM | POA: Diagnosis not present

## 2022-04-13 DIAGNOSIS — Z87891 Personal history of nicotine dependence: Secondary | ICD-10-CM | POA: Diagnosis not present

## 2022-04-13 DIAGNOSIS — L89313 Pressure ulcer of right buttock, stage 3: Secondary | ICD-10-CM | POA: Diagnosis not present

## 2022-04-13 DIAGNOSIS — L89312 Pressure ulcer of right buttock, stage 2: Secondary | ICD-10-CM | POA: Diagnosis not present

## 2022-04-13 DIAGNOSIS — R54 Age-related physical debility: Secondary | ICD-10-CM | POA: Diagnosis not present

## 2022-04-13 DIAGNOSIS — I872 Venous insufficiency (chronic) (peripheral): Secondary | ICD-10-CM | POA: Diagnosis not present

## 2022-04-13 DIAGNOSIS — I428 Other cardiomyopathies: Secondary | ICD-10-CM | POA: Diagnosis not present

## 2022-04-13 DIAGNOSIS — I255 Ischemic cardiomyopathy: Secondary | ICD-10-CM | POA: Diagnosis not present

## 2022-04-13 DIAGNOSIS — L89159 Pressure ulcer of sacral region, unspecified stage: Secondary | ICD-10-CM | POA: Diagnosis not present

## 2022-04-13 DIAGNOSIS — Z7409 Other reduced mobility: Secondary | ICD-10-CM | POA: Diagnosis not present

## 2022-04-13 DIAGNOSIS — E11622 Type 2 diabetes mellitus with other skin ulcer: Secondary | ICD-10-CM | POA: Diagnosis not present

## 2022-04-13 DIAGNOSIS — I130 Hypertensive heart and chronic kidney disease with heart failure and stage 1 through stage 4 chronic kidney disease, or unspecified chronic kidney disease: Secondary | ICD-10-CM | POA: Diagnosis not present

## 2022-04-13 DIAGNOSIS — Z79899 Other long term (current) drug therapy: Secondary | ICD-10-CM | POA: Diagnosis not present

## 2022-04-13 DIAGNOSIS — I5033 Acute on chronic diastolic (congestive) heart failure: Secondary | ICD-10-CM | POA: Diagnosis not present

## 2022-04-13 DIAGNOSIS — N1831 Chronic kidney disease, stage 3a: Secondary | ICD-10-CM | POA: Diagnosis not present

## 2022-04-13 DIAGNOSIS — Z792 Long term (current) use of antibiotics: Secondary | ICD-10-CM | POA: Diagnosis not present

## 2022-04-13 DIAGNOSIS — L89153 Pressure ulcer of sacral region, stage 3: Secondary | ICD-10-CM | POA: Diagnosis not present

## 2022-04-13 DIAGNOSIS — L89310 Pressure ulcer of right buttock, unstageable: Secondary | ICD-10-CM | POA: Diagnosis not present

## 2022-04-13 DIAGNOSIS — I5020 Unspecified systolic (congestive) heart failure: Secondary | ICD-10-CM | POA: Diagnosis not present

## 2022-04-13 DIAGNOSIS — Z885 Allergy status to narcotic agent status: Secondary | ICD-10-CM | POA: Diagnosis not present

## 2022-04-13 DIAGNOSIS — E1169 Type 2 diabetes mellitus with other specified complication: Secondary | ICD-10-CM | POA: Diagnosis not present

## 2022-04-13 DIAGNOSIS — E785 Hyperlipidemia, unspecified: Secondary | ICD-10-CM | POA: Diagnosis not present

## 2022-04-13 DIAGNOSIS — E1151 Type 2 diabetes mellitus with diabetic peripheral angiopathy without gangrene: Secondary | ICD-10-CM | POA: Diagnosis not present

## 2022-04-13 DIAGNOSIS — I6501 Occlusion and stenosis of right vertebral artery: Secondary | ICD-10-CM | POA: Diagnosis not present

## 2022-04-13 DIAGNOSIS — M6281 Muscle weakness (generalized): Secondary | ICD-10-CM | POA: Diagnosis not present

## 2022-04-13 DIAGNOSIS — S81801D Unspecified open wound, right lower leg, subsequent encounter: Secondary | ICD-10-CM | POA: Diagnosis not present

## 2022-04-13 DIAGNOSIS — L89323 Pressure ulcer of left buttock, stage 3: Secondary | ICD-10-CM | POA: Diagnosis not present

## 2022-04-13 DIAGNOSIS — Z794 Long term (current) use of insulin: Secondary | ICD-10-CM | POA: Diagnosis not present

## 2022-04-13 DIAGNOSIS — I890 Lymphedema, not elsewhere classified: Secondary | ICD-10-CM | POA: Diagnosis not present

## 2022-04-13 DIAGNOSIS — E871 Hypo-osmolality and hyponatremia: Secondary | ICD-10-CM | POA: Diagnosis not present

## 2022-04-13 DIAGNOSIS — I5042 Chronic combined systolic (congestive) and diastolic (congestive) heart failure: Secondary | ICD-10-CM | POA: Diagnosis not present

## 2022-04-13 DIAGNOSIS — I252 Old myocardial infarction: Secondary | ICD-10-CM | POA: Diagnosis not present

## 2022-04-21 DIAGNOSIS — I252 Old myocardial infarction: Secondary | ICD-10-CM | POA: Diagnosis not present

## 2022-04-21 DIAGNOSIS — I739 Peripheral vascular disease, unspecified: Secondary | ICD-10-CM | POA: Diagnosis not present

## 2022-04-21 DIAGNOSIS — I255 Ischemic cardiomyopathy: Secondary | ICD-10-CM | POA: Diagnosis not present

## 2022-04-21 DIAGNOSIS — L89323 Pressure ulcer of left buttock, stage 3: Secondary | ICD-10-CM | POA: Diagnosis not present

## 2022-04-21 DIAGNOSIS — L89312 Pressure ulcer of right buttock, stage 2: Secondary | ICD-10-CM | POA: Diagnosis not present

## 2022-04-21 DIAGNOSIS — I10 Essential (primary) hypertension: Secondary | ICD-10-CM | POA: Diagnosis not present

## 2022-04-21 DIAGNOSIS — I5033 Acute on chronic diastolic (congestive) heart failure: Secondary | ICD-10-CM | POA: Diagnosis not present

## 2022-04-21 DIAGNOSIS — I6501 Occlusion and stenosis of right vertebral artery: Secondary | ICD-10-CM | POA: Diagnosis not present

## 2022-04-21 DIAGNOSIS — E1121 Type 2 diabetes mellitus with diabetic nephropathy: Secondary | ICD-10-CM | POA: Diagnosis not present

## 2022-04-21 DIAGNOSIS — I959 Hypotension, unspecified: Secondary | ICD-10-CM | POA: Diagnosis not present

## 2022-04-21 DIAGNOSIS — E871 Hypo-osmolality and hyponatremia: Secondary | ICD-10-CM | POA: Diagnosis not present

## 2022-04-21 DIAGNOSIS — I872 Venous insufficiency (chronic) (peripheral): Secondary | ICD-10-CM | POA: Diagnosis not present

## 2022-04-21 DIAGNOSIS — L89892 Pressure ulcer of other site, stage 2: Secondary | ICD-10-CM | POA: Diagnosis not present

## 2022-04-21 DIAGNOSIS — Z1159 Encounter for screening for other viral diseases: Secondary | ICD-10-CM | POA: Diagnosis not present

## 2022-04-21 DIAGNOSIS — I890 Lymphedema, not elsewhere classified: Secondary | ICD-10-CM | POA: Diagnosis not present

## 2022-04-21 DIAGNOSIS — G4700 Insomnia, unspecified: Secondary | ICD-10-CM | POA: Diagnosis not present

## 2022-04-21 DIAGNOSIS — E1169 Type 2 diabetes mellitus with other specified complication: Secondary | ICD-10-CM | POA: Diagnosis not present

## 2022-04-21 DIAGNOSIS — R197 Diarrhea, unspecified: Secondary | ICD-10-CM | POA: Diagnosis not present

## 2022-04-21 DIAGNOSIS — M6281 Muscle weakness (generalized): Secondary | ICD-10-CM | POA: Diagnosis not present

## 2022-04-21 DIAGNOSIS — L8942 Pressure ulcer of contiguous site of back, buttock and hip, stage 2: Secondary | ICD-10-CM | POA: Diagnosis not present

## 2022-04-21 DIAGNOSIS — R52 Pain, unspecified: Secondary | ICD-10-CM | POA: Diagnosis not present

## 2022-04-21 DIAGNOSIS — I5020 Unspecified systolic (congestive) heart failure: Secondary | ICD-10-CM | POA: Diagnosis not present

## 2022-04-21 DIAGNOSIS — L97812 Non-pressure chronic ulcer of other part of right lower leg with fat layer exposed: Secondary | ICD-10-CM | POA: Diagnosis not present

## 2022-04-21 DIAGNOSIS — E785 Hyperlipidemia, unspecified: Secondary | ICD-10-CM | POA: Diagnosis not present

## 2022-04-21 DIAGNOSIS — K5900 Constipation, unspecified: Secondary | ICD-10-CM | POA: Diagnosis not present

## 2022-04-21 DIAGNOSIS — Z794 Long term (current) use of insulin: Secondary | ICD-10-CM | POA: Diagnosis not present

## 2022-04-21 DIAGNOSIS — N3000 Acute cystitis without hematuria: Secondary | ICD-10-CM | POA: Diagnosis not present

## 2022-04-21 DIAGNOSIS — R54 Age-related physical debility: Secondary | ICD-10-CM | POA: Diagnosis not present

## 2022-04-21 DIAGNOSIS — N1831 Chronic kidney disease, stage 3a: Secondary | ICD-10-CM | POA: Diagnosis not present

## 2022-04-21 DIAGNOSIS — L89302 Pressure ulcer of unspecified buttock, stage 2: Secondary | ICD-10-CM | POA: Diagnosis not present

## 2022-04-22 DIAGNOSIS — E1121 Type 2 diabetes mellitus with diabetic nephropathy: Secondary | ICD-10-CM | POA: Diagnosis not present

## 2022-04-22 DIAGNOSIS — I890 Lymphedema, not elsewhere classified: Secondary | ICD-10-CM | POA: Diagnosis not present

## 2022-04-22 DIAGNOSIS — I10 Essential (primary) hypertension: Secondary | ICD-10-CM | POA: Diagnosis not present

## 2022-04-22 DIAGNOSIS — L8942 Pressure ulcer of contiguous site of back, buttock and hip, stage 2: Secondary | ICD-10-CM | POA: Diagnosis not present

## 2022-04-22 DIAGNOSIS — L89312 Pressure ulcer of right buttock, stage 2: Secondary | ICD-10-CM | POA: Diagnosis not present

## 2022-04-22 DIAGNOSIS — L89323 Pressure ulcer of left buttock, stage 3: Secondary | ICD-10-CM | POA: Diagnosis not present

## 2022-04-22 DIAGNOSIS — E785 Hyperlipidemia, unspecified: Secondary | ICD-10-CM | POA: Diagnosis not present

## 2022-04-22 DIAGNOSIS — N1831 Chronic kidney disease, stage 3a: Secondary | ICD-10-CM | POA: Diagnosis not present

## 2022-04-23 DIAGNOSIS — E871 Hypo-osmolality and hyponatremia: Secondary | ICD-10-CM | POA: Diagnosis not present

## 2022-04-23 DIAGNOSIS — L89323 Pressure ulcer of left buttock, stage 3: Secondary | ICD-10-CM | POA: Diagnosis not present

## 2022-04-23 DIAGNOSIS — L97812 Non-pressure chronic ulcer of other part of right lower leg with fat layer exposed: Secondary | ICD-10-CM | POA: Diagnosis not present

## 2022-04-23 DIAGNOSIS — M6281 Muscle weakness (generalized): Secondary | ICD-10-CM | POA: Diagnosis not present

## 2022-04-23 DIAGNOSIS — L89302 Pressure ulcer of unspecified buttock, stage 2: Secondary | ICD-10-CM | POA: Diagnosis not present

## 2022-04-23 DIAGNOSIS — L8942 Pressure ulcer of contiguous site of back, buttock and hip, stage 2: Secondary | ICD-10-CM | POA: Diagnosis not present

## 2022-04-23 DIAGNOSIS — L89312 Pressure ulcer of right buttock, stage 2: Secondary | ICD-10-CM | POA: Diagnosis not present

## 2022-04-24 DIAGNOSIS — R197 Diarrhea, unspecified: Secondary | ICD-10-CM | POA: Diagnosis not present

## 2022-04-25 DIAGNOSIS — R52 Pain, unspecified: Secondary | ICD-10-CM | POA: Diagnosis not present

## 2022-04-25 DIAGNOSIS — E1121 Type 2 diabetes mellitus with diabetic nephropathy: Secondary | ICD-10-CM | POA: Diagnosis not present

## 2022-04-27 DIAGNOSIS — E1121 Type 2 diabetes mellitus with diabetic nephropathy: Secondary | ICD-10-CM | POA: Diagnosis not present

## 2022-04-27 DIAGNOSIS — L8942 Pressure ulcer of contiguous site of back, buttock and hip, stage 2: Secondary | ICD-10-CM | POA: Diagnosis not present

## 2022-04-27 DIAGNOSIS — E785 Hyperlipidemia, unspecified: Secondary | ICD-10-CM | POA: Diagnosis not present

## 2022-04-27 DIAGNOSIS — I10 Essential (primary) hypertension: Secondary | ICD-10-CM | POA: Diagnosis not present

## 2022-04-28 DIAGNOSIS — L8942 Pressure ulcer of contiguous site of back, buttock and hip, stage 2: Secondary | ICD-10-CM | POA: Diagnosis not present

## 2022-04-28 DIAGNOSIS — L89312 Pressure ulcer of right buttock, stage 2: Secondary | ICD-10-CM | POA: Diagnosis not present

## 2022-04-28 DIAGNOSIS — R52 Pain, unspecified: Secondary | ICD-10-CM | POA: Diagnosis not present

## 2022-04-28 DIAGNOSIS — L89323 Pressure ulcer of left buttock, stage 3: Secondary | ICD-10-CM | POA: Diagnosis not present

## 2022-04-30 DIAGNOSIS — L8942 Pressure ulcer of contiguous site of back, buttock and hip, stage 2: Secondary | ICD-10-CM | POA: Diagnosis not present

## 2022-04-30 DIAGNOSIS — L97812 Non-pressure chronic ulcer of other part of right lower leg with fat layer exposed: Secondary | ICD-10-CM | POA: Diagnosis not present

## 2022-04-30 DIAGNOSIS — L89323 Pressure ulcer of left buttock, stage 3: Secondary | ICD-10-CM | POA: Diagnosis not present

## 2022-04-30 DIAGNOSIS — L89312 Pressure ulcer of right buttock, stage 2: Secondary | ICD-10-CM | POA: Diagnosis not present

## 2022-05-01 DIAGNOSIS — I959 Hypotension, unspecified: Secondary | ICD-10-CM | POA: Diagnosis not present

## 2022-05-03 DIAGNOSIS — R52 Pain, unspecified: Secondary | ICD-10-CM | POA: Diagnosis not present

## 2022-05-04 DIAGNOSIS — G4700 Insomnia, unspecified: Secondary | ICD-10-CM | POA: Diagnosis not present

## 2022-05-04 DIAGNOSIS — K5900 Constipation, unspecified: Secondary | ICD-10-CM | POA: Diagnosis not present

## 2022-05-07 DIAGNOSIS — L97812 Non-pressure chronic ulcer of other part of right lower leg with fat layer exposed: Secondary | ICD-10-CM | POA: Diagnosis not present

## 2022-05-07 DIAGNOSIS — L89312 Pressure ulcer of right buttock, stage 2: Secondary | ICD-10-CM | POA: Diagnosis not present

## 2022-05-07 DIAGNOSIS — L89892 Pressure ulcer of other site, stage 2: Secondary | ICD-10-CM | POA: Diagnosis not present

## 2022-05-07 DIAGNOSIS — L89323 Pressure ulcer of left buttock, stage 3: Secondary | ICD-10-CM | POA: Diagnosis not present

## 2022-05-15 DIAGNOSIS — N1831 Chronic kidney disease, stage 3a: Secondary | ICD-10-CM | POA: Diagnosis not present

## 2022-05-15 DIAGNOSIS — I10 Essential (primary) hypertension: Secondary | ICD-10-CM | POA: Diagnosis not present

## 2022-05-15 DIAGNOSIS — I872 Venous insufficiency (chronic) (peripheral): Secondary | ICD-10-CM | POA: Diagnosis not present

## 2022-05-15 DIAGNOSIS — I5032 Chronic diastolic (congestive) heart failure: Secondary | ICD-10-CM | POA: Diagnosis not present

## 2022-05-15 DIAGNOSIS — L89323 Pressure ulcer of left buttock, stage 3: Secondary | ICD-10-CM | POA: Diagnosis not present

## 2022-05-15 DIAGNOSIS — M810 Age-related osteoporosis without current pathological fracture: Secondary | ICD-10-CM | POA: Diagnosis not present

## 2022-05-15 DIAGNOSIS — Z7985 Long-term (current) use of injectable non-insulin antidiabetic drugs: Secondary | ICD-10-CM | POA: Diagnosis not present

## 2022-05-15 DIAGNOSIS — M6281 Muscle weakness (generalized): Secondary | ICD-10-CM | POA: Diagnosis not present

## 2022-05-15 DIAGNOSIS — E1122 Type 2 diabetes mellitus with diabetic chronic kidney disease: Secondary | ICD-10-CM | POA: Diagnosis not present

## 2022-05-15 DIAGNOSIS — I87311 Chronic venous hypertension (idiopathic) with ulcer of right lower extremity: Secondary | ICD-10-CM | POA: Diagnosis not present

## 2022-05-15 DIAGNOSIS — Z7984 Long term (current) use of oral hypoglycemic drugs: Secondary | ICD-10-CM | POA: Diagnosis not present

## 2022-05-15 DIAGNOSIS — I252 Old myocardial infarction: Secondary | ICD-10-CM | POA: Diagnosis not present

## 2022-05-15 DIAGNOSIS — L8942 Pressure ulcer of contiguous site of back, buttock and hip, stage 2: Secondary | ICD-10-CM | POA: Diagnosis not present

## 2022-05-15 DIAGNOSIS — Z9181 History of falling: Secondary | ICD-10-CM | POA: Diagnosis not present

## 2022-05-15 DIAGNOSIS — I255 Ischemic cardiomyopathy: Secondary | ICD-10-CM | POA: Diagnosis not present

## 2022-05-15 DIAGNOSIS — Z794 Long term (current) use of insulin: Secondary | ICD-10-CM | POA: Diagnosis not present

## 2022-05-15 DIAGNOSIS — I130 Hypertensive heart and chronic kidney disease with heart failure and stage 1 through stage 4 chronic kidney disease, or unspecified chronic kidney disease: Secondary | ICD-10-CM | POA: Diagnosis not present

## 2022-05-15 DIAGNOSIS — E1142 Type 2 diabetes mellitus with diabetic polyneuropathy: Secondary | ICD-10-CM | POA: Diagnosis not present

## 2022-05-15 DIAGNOSIS — I890 Lymphedema, not elsewhere classified: Secondary | ICD-10-CM | POA: Diagnosis not present

## 2022-05-15 DIAGNOSIS — E1121 Type 2 diabetes mellitus with diabetic nephropathy: Secondary | ICD-10-CM | POA: Diagnosis not present

## 2022-05-15 DIAGNOSIS — E1151 Type 2 diabetes mellitus with diabetic peripheral angiopathy without gangrene: Secondary | ICD-10-CM | POA: Diagnosis not present

## 2022-05-15 DIAGNOSIS — E785 Hyperlipidemia, unspecified: Secondary | ICD-10-CM | POA: Diagnosis not present

## 2022-05-15 DIAGNOSIS — L89312 Pressure ulcer of right buttock, stage 2: Secondary | ICD-10-CM | POA: Diagnosis not present

## 2022-05-15 DIAGNOSIS — L97812 Non-pressure chronic ulcer of other part of right lower leg with fat layer exposed: Secondary | ICD-10-CM | POA: Diagnosis not present

## 2022-05-18 DIAGNOSIS — M810 Age-related osteoporosis without current pathological fracture: Secondary | ICD-10-CM | POA: Diagnosis not present

## 2022-05-18 DIAGNOSIS — Z9181 History of falling: Secondary | ICD-10-CM | POA: Diagnosis not present

## 2022-05-18 DIAGNOSIS — M6281 Muscle weakness (generalized): Secondary | ICD-10-CM | POA: Diagnosis not present

## 2022-05-18 DIAGNOSIS — I872 Venous insufficiency (chronic) (peripheral): Secondary | ICD-10-CM | POA: Diagnosis not present

## 2022-05-18 DIAGNOSIS — I252 Old myocardial infarction: Secondary | ICD-10-CM | POA: Diagnosis not present

## 2022-05-18 DIAGNOSIS — L8942 Pressure ulcer of contiguous site of back, buttock and hip, stage 2: Secondary | ICD-10-CM | POA: Diagnosis not present

## 2022-05-18 DIAGNOSIS — E1122 Type 2 diabetes mellitus with diabetic chronic kidney disease: Secondary | ICD-10-CM | POA: Diagnosis not present

## 2022-05-18 DIAGNOSIS — E1151 Type 2 diabetes mellitus with diabetic peripheral angiopathy without gangrene: Secondary | ICD-10-CM | POA: Diagnosis not present

## 2022-05-18 DIAGNOSIS — I87311 Chronic venous hypertension (idiopathic) with ulcer of right lower extremity: Secondary | ICD-10-CM | POA: Diagnosis not present

## 2022-05-18 DIAGNOSIS — E1142 Type 2 diabetes mellitus with diabetic polyneuropathy: Secondary | ICD-10-CM | POA: Diagnosis not present

## 2022-05-18 DIAGNOSIS — N1831 Chronic kidney disease, stage 3a: Secondary | ICD-10-CM | POA: Diagnosis not present

## 2022-05-18 DIAGNOSIS — I255 Ischemic cardiomyopathy: Secondary | ICD-10-CM | POA: Diagnosis not present

## 2022-05-18 DIAGNOSIS — L89312 Pressure ulcer of right buttock, stage 2: Secondary | ICD-10-CM | POA: Diagnosis not present

## 2022-05-18 DIAGNOSIS — L97812 Non-pressure chronic ulcer of other part of right lower leg with fat layer exposed: Secondary | ICD-10-CM | POA: Diagnosis not present

## 2022-05-18 DIAGNOSIS — I890 Lymphedema, not elsewhere classified: Secondary | ICD-10-CM | POA: Diagnosis not present

## 2022-05-18 DIAGNOSIS — L89323 Pressure ulcer of left buttock, stage 3: Secondary | ICD-10-CM | POA: Diagnosis not present

## 2022-05-18 DIAGNOSIS — E785 Hyperlipidemia, unspecified: Secondary | ICD-10-CM | POA: Diagnosis not present

## 2022-05-18 DIAGNOSIS — Z794 Long term (current) use of insulin: Secondary | ICD-10-CM | POA: Diagnosis not present

## 2022-05-18 DIAGNOSIS — I5032 Chronic diastolic (congestive) heart failure: Secondary | ICD-10-CM | POA: Diagnosis not present

## 2022-05-18 DIAGNOSIS — Z7984 Long term (current) use of oral hypoglycemic drugs: Secondary | ICD-10-CM | POA: Diagnosis not present

## 2022-05-18 DIAGNOSIS — I130 Hypertensive heart and chronic kidney disease with heart failure and stage 1 through stage 4 chronic kidney disease, or unspecified chronic kidney disease: Secondary | ICD-10-CM | POA: Diagnosis not present

## 2022-05-18 DIAGNOSIS — Z7985 Long-term (current) use of injectable non-insulin antidiabetic drugs: Secondary | ICD-10-CM | POA: Diagnosis not present

## 2022-05-20 DIAGNOSIS — I255 Ischemic cardiomyopathy: Secondary | ICD-10-CM | POA: Diagnosis not present

## 2022-05-20 DIAGNOSIS — Z7985 Long-term (current) use of injectable non-insulin antidiabetic drugs: Secondary | ICD-10-CM | POA: Diagnosis not present

## 2022-05-20 DIAGNOSIS — E1122 Type 2 diabetes mellitus with diabetic chronic kidney disease: Secondary | ICD-10-CM | POA: Diagnosis not present

## 2022-05-20 DIAGNOSIS — E1151 Type 2 diabetes mellitus with diabetic peripheral angiopathy without gangrene: Secondary | ICD-10-CM | POA: Diagnosis not present

## 2022-05-20 DIAGNOSIS — I252 Old myocardial infarction: Secondary | ICD-10-CM | POA: Diagnosis not present

## 2022-05-20 DIAGNOSIS — I890 Lymphedema, not elsewhere classified: Secondary | ICD-10-CM | POA: Diagnosis not present

## 2022-05-20 DIAGNOSIS — I87311 Chronic venous hypertension (idiopathic) with ulcer of right lower extremity: Secondary | ICD-10-CM | POA: Diagnosis not present

## 2022-05-20 DIAGNOSIS — E1142 Type 2 diabetes mellitus with diabetic polyneuropathy: Secondary | ICD-10-CM | POA: Diagnosis not present

## 2022-05-20 DIAGNOSIS — Z9181 History of falling: Secondary | ICD-10-CM | POA: Diagnosis not present

## 2022-05-20 DIAGNOSIS — E785 Hyperlipidemia, unspecified: Secondary | ICD-10-CM | POA: Diagnosis not present

## 2022-05-20 DIAGNOSIS — L89312 Pressure ulcer of right buttock, stage 2: Secondary | ICD-10-CM | POA: Diagnosis not present

## 2022-05-20 DIAGNOSIS — M810 Age-related osteoporosis without current pathological fracture: Secondary | ICD-10-CM | POA: Diagnosis not present

## 2022-05-20 DIAGNOSIS — L97812 Non-pressure chronic ulcer of other part of right lower leg with fat layer exposed: Secondary | ICD-10-CM | POA: Diagnosis not present

## 2022-05-20 DIAGNOSIS — Z7984 Long term (current) use of oral hypoglycemic drugs: Secondary | ICD-10-CM | POA: Diagnosis not present

## 2022-05-20 DIAGNOSIS — L89323 Pressure ulcer of left buttock, stage 3: Secondary | ICD-10-CM | POA: Diagnosis not present

## 2022-05-20 DIAGNOSIS — N1831 Chronic kidney disease, stage 3a: Secondary | ICD-10-CM | POA: Diagnosis not present

## 2022-05-20 DIAGNOSIS — I872 Venous insufficiency (chronic) (peripheral): Secondary | ICD-10-CM | POA: Diagnosis not present

## 2022-05-20 DIAGNOSIS — L8942 Pressure ulcer of contiguous site of back, buttock and hip, stage 2: Secondary | ICD-10-CM | POA: Diagnosis not present

## 2022-05-20 DIAGNOSIS — Z794 Long term (current) use of insulin: Secondary | ICD-10-CM | POA: Diagnosis not present

## 2022-05-20 DIAGNOSIS — I130 Hypertensive heart and chronic kidney disease with heart failure and stage 1 through stage 4 chronic kidney disease, or unspecified chronic kidney disease: Secondary | ICD-10-CM | POA: Diagnosis not present

## 2022-05-20 DIAGNOSIS — I5032 Chronic diastolic (congestive) heart failure: Secondary | ICD-10-CM | POA: Diagnosis not present

## 2022-05-20 DIAGNOSIS — M6281 Muscle weakness (generalized): Secondary | ICD-10-CM | POA: Diagnosis not present

## 2022-05-21 DIAGNOSIS — L89151 Pressure ulcer of sacral region, stage 1: Secondary | ICD-10-CM | POA: Diagnosis not present

## 2022-05-21 DIAGNOSIS — L89322 Pressure ulcer of left buttock, stage 2: Secondary | ICD-10-CM | POA: Diagnosis not present

## 2022-05-21 DIAGNOSIS — L89312 Pressure ulcer of right buttock, stage 2: Secondary | ICD-10-CM | POA: Diagnosis not present

## 2022-05-22 DIAGNOSIS — N1831 Chronic kidney disease, stage 3a: Secondary | ICD-10-CM | POA: Diagnosis not present

## 2022-05-22 DIAGNOSIS — Z794 Long term (current) use of insulin: Secondary | ICD-10-CM | POA: Diagnosis not present

## 2022-05-22 DIAGNOSIS — Z9181 History of falling: Secondary | ICD-10-CM | POA: Diagnosis not present

## 2022-05-22 DIAGNOSIS — I890 Lymphedema, not elsewhere classified: Secondary | ICD-10-CM | POA: Diagnosis not present

## 2022-05-22 DIAGNOSIS — I130 Hypertensive heart and chronic kidney disease with heart failure and stage 1 through stage 4 chronic kidney disease, or unspecified chronic kidney disease: Secondary | ICD-10-CM | POA: Diagnosis not present

## 2022-05-22 DIAGNOSIS — L8942 Pressure ulcer of contiguous site of back, buttock and hip, stage 2: Secondary | ICD-10-CM | POA: Diagnosis not present

## 2022-05-22 DIAGNOSIS — L89312 Pressure ulcer of right buttock, stage 2: Secondary | ICD-10-CM | POA: Diagnosis not present

## 2022-05-22 DIAGNOSIS — E1151 Type 2 diabetes mellitus with diabetic peripheral angiopathy without gangrene: Secondary | ICD-10-CM | POA: Diagnosis not present

## 2022-05-22 DIAGNOSIS — E1122 Type 2 diabetes mellitus with diabetic chronic kidney disease: Secondary | ICD-10-CM | POA: Diagnosis not present

## 2022-05-22 DIAGNOSIS — I252 Old myocardial infarction: Secondary | ICD-10-CM | POA: Diagnosis not present

## 2022-05-22 DIAGNOSIS — E1142 Type 2 diabetes mellitus with diabetic polyneuropathy: Secondary | ICD-10-CM | POA: Diagnosis not present

## 2022-05-22 DIAGNOSIS — M6281 Muscle weakness (generalized): Secondary | ICD-10-CM | POA: Diagnosis not present

## 2022-05-22 DIAGNOSIS — I87311 Chronic venous hypertension (idiopathic) with ulcer of right lower extremity: Secondary | ICD-10-CM | POA: Diagnosis not present

## 2022-05-22 DIAGNOSIS — M810 Age-related osteoporosis without current pathological fracture: Secondary | ICD-10-CM | POA: Diagnosis not present

## 2022-05-22 DIAGNOSIS — L97812 Non-pressure chronic ulcer of other part of right lower leg with fat layer exposed: Secondary | ICD-10-CM | POA: Diagnosis not present

## 2022-05-22 DIAGNOSIS — Z7984 Long term (current) use of oral hypoglycemic drugs: Secondary | ICD-10-CM | POA: Diagnosis not present

## 2022-05-22 DIAGNOSIS — Z7985 Long-term (current) use of injectable non-insulin antidiabetic drugs: Secondary | ICD-10-CM | POA: Diagnosis not present

## 2022-05-22 DIAGNOSIS — I872 Venous insufficiency (chronic) (peripheral): Secondary | ICD-10-CM | POA: Diagnosis not present

## 2022-05-22 DIAGNOSIS — E785 Hyperlipidemia, unspecified: Secondary | ICD-10-CM | POA: Diagnosis not present

## 2022-05-22 DIAGNOSIS — I5032 Chronic diastolic (congestive) heart failure: Secondary | ICD-10-CM | POA: Diagnosis not present

## 2022-05-22 DIAGNOSIS — I255 Ischemic cardiomyopathy: Secondary | ICD-10-CM | POA: Diagnosis not present

## 2022-05-22 DIAGNOSIS — L89323 Pressure ulcer of left buttock, stage 3: Secondary | ICD-10-CM | POA: Diagnosis not present

## 2022-05-25 DIAGNOSIS — M810 Age-related osteoporosis without current pathological fracture: Secondary | ICD-10-CM | POA: Diagnosis not present

## 2022-05-25 DIAGNOSIS — L89312 Pressure ulcer of right buttock, stage 2: Secondary | ICD-10-CM | POA: Diagnosis not present

## 2022-05-25 DIAGNOSIS — E785 Hyperlipidemia, unspecified: Secondary | ICD-10-CM | POA: Diagnosis not present

## 2022-05-25 DIAGNOSIS — N1831 Chronic kidney disease, stage 3a: Secondary | ICD-10-CM | POA: Diagnosis not present

## 2022-05-25 DIAGNOSIS — I130 Hypertensive heart and chronic kidney disease with heart failure and stage 1 through stage 4 chronic kidney disease, or unspecified chronic kidney disease: Secondary | ICD-10-CM | POA: Diagnosis not present

## 2022-05-25 DIAGNOSIS — I872 Venous insufficiency (chronic) (peripheral): Secondary | ICD-10-CM | POA: Diagnosis not present

## 2022-05-25 DIAGNOSIS — I255 Ischemic cardiomyopathy: Secondary | ICD-10-CM | POA: Diagnosis not present

## 2022-05-25 DIAGNOSIS — E1142 Type 2 diabetes mellitus with diabetic polyneuropathy: Secondary | ICD-10-CM | POA: Diagnosis not present

## 2022-05-25 DIAGNOSIS — L8942 Pressure ulcer of contiguous site of back, buttock and hip, stage 2: Secondary | ICD-10-CM | POA: Diagnosis not present

## 2022-05-25 DIAGNOSIS — Z794 Long term (current) use of insulin: Secondary | ICD-10-CM | POA: Diagnosis not present

## 2022-05-25 DIAGNOSIS — I5032 Chronic diastolic (congestive) heart failure: Secondary | ICD-10-CM | POA: Diagnosis not present

## 2022-05-25 DIAGNOSIS — Z7985 Long-term (current) use of injectable non-insulin antidiabetic drugs: Secondary | ICD-10-CM | POA: Diagnosis not present

## 2022-05-25 DIAGNOSIS — E1151 Type 2 diabetes mellitus with diabetic peripheral angiopathy without gangrene: Secondary | ICD-10-CM | POA: Diagnosis not present

## 2022-05-25 DIAGNOSIS — L89323 Pressure ulcer of left buttock, stage 3: Secondary | ICD-10-CM | POA: Diagnosis not present

## 2022-05-25 DIAGNOSIS — M6281 Muscle weakness (generalized): Secondary | ICD-10-CM | POA: Diagnosis not present

## 2022-05-25 DIAGNOSIS — I87311 Chronic venous hypertension (idiopathic) with ulcer of right lower extremity: Secondary | ICD-10-CM | POA: Diagnosis not present

## 2022-05-25 DIAGNOSIS — I890 Lymphedema, not elsewhere classified: Secondary | ICD-10-CM | POA: Diagnosis not present

## 2022-05-25 DIAGNOSIS — Z9181 History of falling: Secondary | ICD-10-CM | POA: Diagnosis not present

## 2022-05-25 DIAGNOSIS — I252 Old myocardial infarction: Secondary | ICD-10-CM | POA: Diagnosis not present

## 2022-05-25 DIAGNOSIS — E1122 Type 2 diabetes mellitus with diabetic chronic kidney disease: Secondary | ICD-10-CM | POA: Diagnosis not present

## 2022-05-25 DIAGNOSIS — Z7984 Long term (current) use of oral hypoglycemic drugs: Secondary | ICD-10-CM | POA: Diagnosis not present

## 2022-05-25 DIAGNOSIS — L97812 Non-pressure chronic ulcer of other part of right lower leg with fat layer exposed: Secondary | ICD-10-CM | POA: Diagnosis not present

## 2022-05-27 DIAGNOSIS — L8942 Pressure ulcer of contiguous site of back, buttock and hip, stage 2: Secondary | ICD-10-CM | POA: Diagnosis not present

## 2022-05-27 DIAGNOSIS — L97812 Non-pressure chronic ulcer of other part of right lower leg with fat layer exposed: Secondary | ICD-10-CM | POA: Diagnosis not present

## 2022-05-27 DIAGNOSIS — I130 Hypertensive heart and chronic kidney disease with heart failure and stage 1 through stage 4 chronic kidney disease, or unspecified chronic kidney disease: Secondary | ICD-10-CM | POA: Diagnosis not present

## 2022-05-27 DIAGNOSIS — E785 Hyperlipidemia, unspecified: Secondary | ICD-10-CM | POA: Diagnosis not present

## 2022-05-27 DIAGNOSIS — I890 Lymphedema, not elsewhere classified: Secondary | ICD-10-CM | POA: Diagnosis not present

## 2022-05-27 DIAGNOSIS — E1122 Type 2 diabetes mellitus with diabetic chronic kidney disease: Secondary | ICD-10-CM | POA: Diagnosis not present

## 2022-05-27 DIAGNOSIS — I87311 Chronic venous hypertension (idiopathic) with ulcer of right lower extremity: Secondary | ICD-10-CM | POA: Diagnosis not present

## 2022-05-27 DIAGNOSIS — I872 Venous insufficiency (chronic) (peripheral): Secondary | ICD-10-CM | POA: Diagnosis not present

## 2022-05-27 DIAGNOSIS — I255 Ischemic cardiomyopathy: Secondary | ICD-10-CM | POA: Diagnosis not present

## 2022-05-27 DIAGNOSIS — N1831 Chronic kidney disease, stage 3a: Secondary | ICD-10-CM | POA: Diagnosis not present

## 2022-05-27 DIAGNOSIS — M6281 Muscle weakness (generalized): Secondary | ICD-10-CM | POA: Diagnosis not present

## 2022-05-27 DIAGNOSIS — L89323 Pressure ulcer of left buttock, stage 3: Secondary | ICD-10-CM | POA: Diagnosis not present

## 2022-05-27 DIAGNOSIS — E1151 Type 2 diabetes mellitus with diabetic peripheral angiopathy without gangrene: Secondary | ICD-10-CM | POA: Diagnosis not present

## 2022-05-27 DIAGNOSIS — M810 Age-related osteoporosis without current pathological fracture: Secondary | ICD-10-CM | POA: Diagnosis not present

## 2022-05-27 DIAGNOSIS — I5032 Chronic diastolic (congestive) heart failure: Secondary | ICD-10-CM | POA: Diagnosis not present

## 2022-05-27 DIAGNOSIS — E1142 Type 2 diabetes mellitus with diabetic polyneuropathy: Secondary | ICD-10-CM | POA: Diagnosis not present

## 2022-05-27 DIAGNOSIS — L89312 Pressure ulcer of right buttock, stage 2: Secondary | ICD-10-CM | POA: Diagnosis not present

## 2022-05-27 DIAGNOSIS — Z794 Long term (current) use of insulin: Secondary | ICD-10-CM | POA: Diagnosis not present

## 2022-05-27 DIAGNOSIS — I252 Old myocardial infarction: Secondary | ICD-10-CM | POA: Diagnosis not present

## 2022-05-27 DIAGNOSIS — Z7984 Long term (current) use of oral hypoglycemic drugs: Secondary | ICD-10-CM | POA: Diagnosis not present

## 2022-05-27 DIAGNOSIS — Z7985 Long-term (current) use of injectable non-insulin antidiabetic drugs: Secondary | ICD-10-CM | POA: Diagnosis not present

## 2022-05-27 DIAGNOSIS — Z9181 History of falling: Secondary | ICD-10-CM | POA: Diagnosis not present

## 2022-05-29 DIAGNOSIS — Z7984 Long term (current) use of oral hypoglycemic drugs: Secondary | ICD-10-CM | POA: Diagnosis not present

## 2022-05-29 DIAGNOSIS — E1151 Type 2 diabetes mellitus with diabetic peripheral angiopathy without gangrene: Secondary | ICD-10-CM | POA: Diagnosis not present

## 2022-05-29 DIAGNOSIS — L89312 Pressure ulcer of right buttock, stage 2: Secondary | ICD-10-CM | POA: Diagnosis not present

## 2022-05-29 DIAGNOSIS — Z9181 History of falling: Secondary | ICD-10-CM | POA: Diagnosis not present

## 2022-05-29 DIAGNOSIS — I87311 Chronic venous hypertension (idiopathic) with ulcer of right lower extremity: Secondary | ICD-10-CM | POA: Diagnosis not present

## 2022-05-29 DIAGNOSIS — L97812 Non-pressure chronic ulcer of other part of right lower leg with fat layer exposed: Secondary | ICD-10-CM | POA: Diagnosis not present

## 2022-05-29 DIAGNOSIS — E1122 Type 2 diabetes mellitus with diabetic chronic kidney disease: Secondary | ICD-10-CM | POA: Diagnosis not present

## 2022-05-29 DIAGNOSIS — Z7985 Long-term (current) use of injectable non-insulin antidiabetic drugs: Secondary | ICD-10-CM | POA: Diagnosis not present

## 2022-05-29 DIAGNOSIS — I252 Old myocardial infarction: Secondary | ICD-10-CM | POA: Diagnosis not present

## 2022-05-29 DIAGNOSIS — I130 Hypertensive heart and chronic kidney disease with heart failure and stage 1 through stage 4 chronic kidney disease, or unspecified chronic kidney disease: Secondary | ICD-10-CM | POA: Diagnosis not present

## 2022-05-29 DIAGNOSIS — E1142 Type 2 diabetes mellitus with diabetic polyneuropathy: Secondary | ICD-10-CM | POA: Diagnosis not present

## 2022-05-29 DIAGNOSIS — M810 Age-related osteoporosis without current pathological fracture: Secondary | ICD-10-CM | POA: Diagnosis not present

## 2022-05-29 DIAGNOSIS — I890 Lymphedema, not elsewhere classified: Secondary | ICD-10-CM | POA: Diagnosis not present

## 2022-05-29 DIAGNOSIS — M6281 Muscle weakness (generalized): Secondary | ICD-10-CM | POA: Diagnosis not present

## 2022-05-29 DIAGNOSIS — E785 Hyperlipidemia, unspecified: Secondary | ICD-10-CM | POA: Diagnosis not present

## 2022-05-29 DIAGNOSIS — I872 Venous insufficiency (chronic) (peripheral): Secondary | ICD-10-CM | POA: Diagnosis not present

## 2022-05-29 DIAGNOSIS — N1831 Chronic kidney disease, stage 3a: Secondary | ICD-10-CM | POA: Diagnosis not present

## 2022-05-29 DIAGNOSIS — L8942 Pressure ulcer of contiguous site of back, buttock and hip, stage 2: Secondary | ICD-10-CM | POA: Diagnosis not present

## 2022-05-29 DIAGNOSIS — I5032 Chronic diastolic (congestive) heart failure: Secondary | ICD-10-CM | POA: Diagnosis not present

## 2022-05-29 DIAGNOSIS — I255 Ischemic cardiomyopathy: Secondary | ICD-10-CM | POA: Diagnosis not present

## 2022-05-29 DIAGNOSIS — Z794 Long term (current) use of insulin: Secondary | ICD-10-CM | POA: Diagnosis not present

## 2022-05-29 DIAGNOSIS — L89323 Pressure ulcer of left buttock, stage 3: Secondary | ICD-10-CM | POA: Diagnosis not present

## 2022-06-01 DIAGNOSIS — N1831 Chronic kidney disease, stage 3a: Secondary | ICD-10-CM | POA: Diagnosis not present

## 2022-06-01 DIAGNOSIS — I5032 Chronic diastolic (congestive) heart failure: Secondary | ICD-10-CM | POA: Diagnosis not present

## 2022-06-01 DIAGNOSIS — Z7985 Long-term (current) use of injectable non-insulin antidiabetic drugs: Secondary | ICD-10-CM | POA: Diagnosis not present

## 2022-06-01 DIAGNOSIS — E1122 Type 2 diabetes mellitus with diabetic chronic kidney disease: Secondary | ICD-10-CM | POA: Diagnosis not present

## 2022-06-01 DIAGNOSIS — I255 Ischemic cardiomyopathy: Secondary | ICD-10-CM | POA: Diagnosis not present

## 2022-06-01 DIAGNOSIS — E785 Hyperlipidemia, unspecified: Secondary | ICD-10-CM | POA: Diagnosis not present

## 2022-06-01 DIAGNOSIS — E1142 Type 2 diabetes mellitus with diabetic polyneuropathy: Secondary | ICD-10-CM | POA: Diagnosis not present

## 2022-06-01 DIAGNOSIS — E1151 Type 2 diabetes mellitus with diabetic peripheral angiopathy without gangrene: Secondary | ICD-10-CM | POA: Diagnosis not present

## 2022-06-01 DIAGNOSIS — L89312 Pressure ulcer of right buttock, stage 2: Secondary | ICD-10-CM | POA: Diagnosis not present

## 2022-06-01 DIAGNOSIS — Z794 Long term (current) use of insulin: Secondary | ICD-10-CM | POA: Diagnosis not present

## 2022-06-01 DIAGNOSIS — Z9181 History of falling: Secondary | ICD-10-CM | POA: Diagnosis not present

## 2022-06-01 DIAGNOSIS — L89323 Pressure ulcer of left buttock, stage 3: Secondary | ICD-10-CM | POA: Diagnosis not present

## 2022-06-01 DIAGNOSIS — I87311 Chronic venous hypertension (idiopathic) with ulcer of right lower extremity: Secondary | ICD-10-CM | POA: Diagnosis not present

## 2022-06-01 DIAGNOSIS — I252 Old myocardial infarction: Secondary | ICD-10-CM | POA: Diagnosis not present

## 2022-06-01 DIAGNOSIS — Z7984 Long term (current) use of oral hypoglycemic drugs: Secondary | ICD-10-CM | POA: Diagnosis not present

## 2022-06-01 DIAGNOSIS — I130 Hypertensive heart and chronic kidney disease with heart failure and stage 1 through stage 4 chronic kidney disease, or unspecified chronic kidney disease: Secondary | ICD-10-CM | POA: Diagnosis not present

## 2022-06-01 DIAGNOSIS — M810 Age-related osteoporosis without current pathological fracture: Secondary | ICD-10-CM | POA: Diagnosis not present

## 2022-06-01 DIAGNOSIS — L8942 Pressure ulcer of contiguous site of back, buttock and hip, stage 2: Secondary | ICD-10-CM | POA: Diagnosis not present

## 2022-06-01 DIAGNOSIS — I872 Venous insufficiency (chronic) (peripheral): Secondary | ICD-10-CM | POA: Diagnosis not present

## 2022-06-01 DIAGNOSIS — L97812 Non-pressure chronic ulcer of other part of right lower leg with fat layer exposed: Secondary | ICD-10-CM | POA: Diagnosis not present

## 2022-06-01 DIAGNOSIS — I890 Lymphedema, not elsewhere classified: Secondary | ICD-10-CM | POA: Diagnosis not present

## 2022-06-01 DIAGNOSIS — M6281 Muscle weakness (generalized): Secondary | ICD-10-CM | POA: Diagnosis not present

## 2022-06-02 DIAGNOSIS — I252 Old myocardial infarction: Secondary | ICD-10-CM | POA: Diagnosis not present

## 2022-06-02 DIAGNOSIS — I87311 Chronic venous hypertension (idiopathic) with ulcer of right lower extremity: Secondary | ICD-10-CM | POA: Diagnosis not present

## 2022-06-02 DIAGNOSIS — E1122 Type 2 diabetes mellitus with diabetic chronic kidney disease: Secondary | ICD-10-CM | POA: Diagnosis not present

## 2022-06-02 DIAGNOSIS — I5032 Chronic diastolic (congestive) heart failure: Secondary | ICD-10-CM | POA: Diagnosis not present

## 2022-06-02 DIAGNOSIS — Z9181 History of falling: Secondary | ICD-10-CM | POA: Diagnosis not present

## 2022-06-02 DIAGNOSIS — L97812 Non-pressure chronic ulcer of other part of right lower leg with fat layer exposed: Secondary | ICD-10-CM | POA: Diagnosis not present

## 2022-06-02 DIAGNOSIS — I130 Hypertensive heart and chronic kidney disease with heart failure and stage 1 through stage 4 chronic kidney disease, or unspecified chronic kidney disease: Secondary | ICD-10-CM | POA: Diagnosis not present

## 2022-06-02 DIAGNOSIS — L89323 Pressure ulcer of left buttock, stage 3: Secondary | ICD-10-CM | POA: Diagnosis not present

## 2022-06-02 DIAGNOSIS — Z794 Long term (current) use of insulin: Secondary | ICD-10-CM | POA: Diagnosis not present

## 2022-06-02 DIAGNOSIS — E1142 Type 2 diabetes mellitus with diabetic polyneuropathy: Secondary | ICD-10-CM | POA: Diagnosis not present

## 2022-06-02 DIAGNOSIS — I890 Lymphedema, not elsewhere classified: Secondary | ICD-10-CM | POA: Diagnosis not present

## 2022-06-02 DIAGNOSIS — N1831 Chronic kidney disease, stage 3a: Secondary | ICD-10-CM | POA: Diagnosis not present

## 2022-06-02 DIAGNOSIS — L89312 Pressure ulcer of right buttock, stage 2: Secondary | ICD-10-CM | POA: Diagnosis not present

## 2022-06-02 DIAGNOSIS — M6281 Muscle weakness (generalized): Secondary | ICD-10-CM | POA: Diagnosis not present

## 2022-06-02 DIAGNOSIS — M810 Age-related osteoporosis without current pathological fracture: Secondary | ICD-10-CM | POA: Diagnosis not present

## 2022-06-02 DIAGNOSIS — I872 Venous insufficiency (chronic) (peripheral): Secondary | ICD-10-CM | POA: Diagnosis not present

## 2022-06-02 DIAGNOSIS — L8942 Pressure ulcer of contiguous site of back, buttock and hip, stage 2: Secondary | ICD-10-CM | POA: Diagnosis not present

## 2022-06-02 DIAGNOSIS — I255 Ischemic cardiomyopathy: Secondary | ICD-10-CM | POA: Diagnosis not present

## 2022-06-02 DIAGNOSIS — Z7985 Long-term (current) use of injectable non-insulin antidiabetic drugs: Secondary | ICD-10-CM | POA: Diagnosis not present

## 2022-06-02 DIAGNOSIS — E785 Hyperlipidemia, unspecified: Secondary | ICD-10-CM | POA: Diagnosis not present

## 2022-06-02 DIAGNOSIS — Z7984 Long term (current) use of oral hypoglycemic drugs: Secondary | ICD-10-CM | POA: Diagnosis not present

## 2022-06-02 DIAGNOSIS — E1151 Type 2 diabetes mellitus with diabetic peripheral angiopathy without gangrene: Secondary | ICD-10-CM | POA: Diagnosis not present

## 2022-06-03 DIAGNOSIS — I130 Hypertensive heart and chronic kidney disease with heart failure and stage 1 through stage 4 chronic kidney disease, or unspecified chronic kidney disease: Secondary | ICD-10-CM | POA: Diagnosis not present

## 2022-06-03 DIAGNOSIS — Z9181 History of falling: Secondary | ICD-10-CM | POA: Diagnosis not present

## 2022-06-03 DIAGNOSIS — I87311 Chronic venous hypertension (idiopathic) with ulcer of right lower extremity: Secondary | ICD-10-CM | POA: Diagnosis not present

## 2022-06-03 DIAGNOSIS — M6281 Muscle weakness (generalized): Secondary | ICD-10-CM | POA: Diagnosis not present

## 2022-06-03 DIAGNOSIS — L89323 Pressure ulcer of left buttock, stage 3: Secondary | ICD-10-CM | POA: Diagnosis not present

## 2022-06-03 DIAGNOSIS — L8942 Pressure ulcer of contiguous site of back, buttock and hip, stage 2: Secondary | ICD-10-CM | POA: Diagnosis not present

## 2022-06-03 DIAGNOSIS — Z7985 Long-term (current) use of injectable non-insulin antidiabetic drugs: Secondary | ICD-10-CM | POA: Diagnosis not present

## 2022-06-03 DIAGNOSIS — E1122 Type 2 diabetes mellitus with diabetic chronic kidney disease: Secondary | ICD-10-CM | POA: Diagnosis not present

## 2022-06-03 DIAGNOSIS — E1142 Type 2 diabetes mellitus with diabetic polyneuropathy: Secondary | ICD-10-CM | POA: Diagnosis not present

## 2022-06-03 DIAGNOSIS — I890 Lymphedema, not elsewhere classified: Secondary | ICD-10-CM | POA: Diagnosis not present

## 2022-06-03 DIAGNOSIS — Z794 Long term (current) use of insulin: Secondary | ICD-10-CM | POA: Diagnosis not present

## 2022-06-03 DIAGNOSIS — I252 Old myocardial infarction: Secondary | ICD-10-CM | POA: Diagnosis not present

## 2022-06-03 DIAGNOSIS — Z7984 Long term (current) use of oral hypoglycemic drugs: Secondary | ICD-10-CM | POA: Diagnosis not present

## 2022-06-03 DIAGNOSIS — I5032 Chronic diastolic (congestive) heart failure: Secondary | ICD-10-CM | POA: Diagnosis not present

## 2022-06-03 DIAGNOSIS — L89312 Pressure ulcer of right buttock, stage 2: Secondary | ICD-10-CM | POA: Diagnosis not present

## 2022-06-03 DIAGNOSIS — I872 Venous insufficiency (chronic) (peripheral): Secondary | ICD-10-CM | POA: Diagnosis not present

## 2022-06-03 DIAGNOSIS — L97812 Non-pressure chronic ulcer of other part of right lower leg with fat layer exposed: Secondary | ICD-10-CM | POA: Diagnosis not present

## 2022-06-03 DIAGNOSIS — I255 Ischemic cardiomyopathy: Secondary | ICD-10-CM | POA: Diagnosis not present

## 2022-06-03 DIAGNOSIS — M810 Age-related osteoporosis without current pathological fracture: Secondary | ICD-10-CM | POA: Diagnosis not present

## 2022-06-03 DIAGNOSIS — N1831 Chronic kidney disease, stage 3a: Secondary | ICD-10-CM | POA: Diagnosis not present

## 2022-06-03 DIAGNOSIS — E785 Hyperlipidemia, unspecified: Secondary | ICD-10-CM | POA: Diagnosis not present

## 2022-06-03 DIAGNOSIS — E1151 Type 2 diabetes mellitus with diabetic peripheral angiopathy without gangrene: Secondary | ICD-10-CM | POA: Diagnosis not present

## 2022-06-05 DIAGNOSIS — M6281 Muscle weakness (generalized): Secondary | ICD-10-CM | POA: Diagnosis not present

## 2022-06-05 DIAGNOSIS — Z7985 Long-term (current) use of injectable non-insulin antidiabetic drugs: Secondary | ICD-10-CM | POA: Diagnosis not present

## 2022-06-05 DIAGNOSIS — Z794 Long term (current) use of insulin: Secondary | ICD-10-CM | POA: Diagnosis not present

## 2022-06-05 DIAGNOSIS — N1831 Chronic kidney disease, stage 3a: Secondary | ICD-10-CM | POA: Diagnosis not present

## 2022-06-05 DIAGNOSIS — E1142 Type 2 diabetes mellitus with diabetic polyneuropathy: Secondary | ICD-10-CM | POA: Diagnosis not present

## 2022-06-05 DIAGNOSIS — L89323 Pressure ulcer of left buttock, stage 3: Secondary | ICD-10-CM | POA: Diagnosis not present

## 2022-06-05 DIAGNOSIS — I130 Hypertensive heart and chronic kidney disease with heart failure and stage 1 through stage 4 chronic kidney disease, or unspecified chronic kidney disease: Secondary | ICD-10-CM | POA: Diagnosis not present

## 2022-06-05 DIAGNOSIS — I890 Lymphedema, not elsewhere classified: Secondary | ICD-10-CM | POA: Diagnosis not present

## 2022-06-05 DIAGNOSIS — L97812 Non-pressure chronic ulcer of other part of right lower leg with fat layer exposed: Secondary | ICD-10-CM | POA: Diagnosis not present

## 2022-06-05 DIAGNOSIS — L89312 Pressure ulcer of right buttock, stage 2: Secondary | ICD-10-CM | POA: Diagnosis not present

## 2022-06-05 DIAGNOSIS — Z7984 Long term (current) use of oral hypoglycemic drugs: Secondary | ICD-10-CM | POA: Diagnosis not present

## 2022-06-05 DIAGNOSIS — E1122 Type 2 diabetes mellitus with diabetic chronic kidney disease: Secondary | ICD-10-CM | POA: Diagnosis not present

## 2022-06-05 DIAGNOSIS — I252 Old myocardial infarction: Secondary | ICD-10-CM | POA: Diagnosis not present

## 2022-06-05 DIAGNOSIS — E785 Hyperlipidemia, unspecified: Secondary | ICD-10-CM | POA: Diagnosis not present

## 2022-06-05 DIAGNOSIS — I255 Ischemic cardiomyopathy: Secondary | ICD-10-CM | POA: Diagnosis not present

## 2022-06-05 DIAGNOSIS — Z9181 History of falling: Secondary | ICD-10-CM | POA: Diagnosis not present

## 2022-06-05 DIAGNOSIS — E1151 Type 2 diabetes mellitus with diabetic peripheral angiopathy without gangrene: Secondary | ICD-10-CM | POA: Diagnosis not present

## 2022-06-05 DIAGNOSIS — I5032 Chronic diastolic (congestive) heart failure: Secondary | ICD-10-CM | POA: Diagnosis not present

## 2022-06-05 DIAGNOSIS — M810 Age-related osteoporosis without current pathological fracture: Secondary | ICD-10-CM | POA: Diagnosis not present

## 2022-06-05 DIAGNOSIS — L8942 Pressure ulcer of contiguous site of back, buttock and hip, stage 2: Secondary | ICD-10-CM | POA: Diagnosis not present

## 2022-06-05 DIAGNOSIS — I872 Venous insufficiency (chronic) (peripheral): Secondary | ICD-10-CM | POA: Diagnosis not present

## 2022-06-05 DIAGNOSIS — I87311 Chronic venous hypertension (idiopathic) with ulcer of right lower extremity: Secondary | ICD-10-CM | POA: Diagnosis not present

## 2022-06-08 DIAGNOSIS — I130 Hypertensive heart and chronic kidney disease with heart failure and stage 1 through stage 4 chronic kidney disease, or unspecified chronic kidney disease: Secondary | ICD-10-CM | POA: Diagnosis not present

## 2022-06-08 DIAGNOSIS — I255 Ischemic cardiomyopathy: Secondary | ICD-10-CM | POA: Diagnosis not present

## 2022-06-08 DIAGNOSIS — M6281 Muscle weakness (generalized): Secondary | ICD-10-CM | POA: Diagnosis not present

## 2022-06-08 DIAGNOSIS — M79671 Pain in right foot: Secondary | ICD-10-CM | POA: Diagnosis not present

## 2022-06-08 DIAGNOSIS — Z7984 Long term (current) use of oral hypoglycemic drugs: Secondary | ICD-10-CM | POA: Diagnosis not present

## 2022-06-08 DIAGNOSIS — Z9181 History of falling: Secondary | ICD-10-CM | POA: Diagnosis not present

## 2022-06-08 DIAGNOSIS — N1831 Chronic kidney disease, stage 3a: Secondary | ICD-10-CM | POA: Diagnosis not present

## 2022-06-08 DIAGNOSIS — E785 Hyperlipidemia, unspecified: Secondary | ICD-10-CM | POA: Diagnosis not present

## 2022-06-08 DIAGNOSIS — M810 Age-related osteoporosis without current pathological fracture: Secondary | ICD-10-CM | POA: Diagnosis not present

## 2022-06-08 DIAGNOSIS — Z794 Long term (current) use of insulin: Secondary | ICD-10-CM | POA: Diagnosis not present

## 2022-06-08 DIAGNOSIS — M79672 Pain in left foot: Secondary | ICD-10-CM | POA: Diagnosis not present

## 2022-06-08 DIAGNOSIS — B351 Tinea unguium: Secondary | ICD-10-CM | POA: Diagnosis not present

## 2022-06-08 DIAGNOSIS — I890 Lymphedema, not elsewhere classified: Secondary | ICD-10-CM | POA: Diagnosis not present

## 2022-06-08 DIAGNOSIS — E1142 Type 2 diabetes mellitus with diabetic polyneuropathy: Secondary | ICD-10-CM | POA: Diagnosis not present

## 2022-06-08 DIAGNOSIS — I5032 Chronic diastolic (congestive) heart failure: Secondary | ICD-10-CM | POA: Diagnosis not present

## 2022-06-08 DIAGNOSIS — L8942 Pressure ulcer of contiguous site of back, buttock and hip, stage 2: Secondary | ICD-10-CM | POA: Diagnosis not present

## 2022-06-08 DIAGNOSIS — L89323 Pressure ulcer of left buttock, stage 3: Secondary | ICD-10-CM | POA: Diagnosis not present

## 2022-06-08 DIAGNOSIS — I872 Venous insufficiency (chronic) (peripheral): Secondary | ICD-10-CM | POA: Diagnosis not present

## 2022-06-08 DIAGNOSIS — E1151 Type 2 diabetes mellitus with diabetic peripheral angiopathy without gangrene: Secondary | ICD-10-CM | POA: Diagnosis not present

## 2022-06-08 DIAGNOSIS — E1122 Type 2 diabetes mellitus with diabetic chronic kidney disease: Secondary | ICD-10-CM | POA: Diagnosis not present

## 2022-06-08 DIAGNOSIS — I252 Old myocardial infarction: Secondary | ICD-10-CM | POA: Diagnosis not present

## 2022-06-08 DIAGNOSIS — L89312 Pressure ulcer of right buttock, stage 2: Secondary | ICD-10-CM | POA: Diagnosis not present

## 2022-06-08 DIAGNOSIS — L97812 Non-pressure chronic ulcer of other part of right lower leg with fat layer exposed: Secondary | ICD-10-CM | POA: Diagnosis not present

## 2022-06-08 DIAGNOSIS — I87311 Chronic venous hypertension (idiopathic) with ulcer of right lower extremity: Secondary | ICD-10-CM | POA: Diagnosis not present

## 2022-06-08 DIAGNOSIS — Z7985 Long-term (current) use of injectable non-insulin antidiabetic drugs: Secondary | ICD-10-CM | POA: Diagnosis not present

## 2022-06-09 ENCOUNTER — Encounter: Payer: Self-pay | Admitting: Cardiology

## 2022-06-09 ENCOUNTER — Ambulatory Visit: Payer: Medicare Other | Attending: Cardiology | Admitting: Cardiology

## 2022-06-09 VITALS — BP 120/80 | HR 92 | Ht 68.0 in | Wt 214.4 lb

## 2022-06-09 DIAGNOSIS — I739 Peripheral vascular disease, unspecified: Secondary | ICD-10-CM

## 2022-06-09 DIAGNOSIS — I5022 Chronic systolic (congestive) heart failure: Secondary | ICD-10-CM

## 2022-06-09 NOTE — Progress Notes (Signed)
Clinical Summary James Dougherty is a 72 y.o.male seen today for follow up of the following medical problems.       1.Abnormal stress test/Systolic dysfunction - ordered for preop for TC[facility] - 04/2021 nuclear stress: apical to basal anteroseptal scar with very mild ishcemia toward apex. Likely inferior artifact but cannot exclude component of scar. LVEF 23% - 05/2021 echo: poor visualization, LVEF roughly 30-40%. Grade III dd.  - 06/2021 echo with contrast: LVEF 35%, global hypokiensis - did not pursue cath due to SDH and inability to take plavix. Patient also decided would not want to pursue at any point as well      - no SOB/DOE.  No recent edema - compliant with meds. Low dose losartan and aldactone separtely led to high potassium levels, due to this did not try entresto. 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  - at d/c plan for repeat CT in 2 weeks, continue to hold ASA and plavix until f/u - has f/u Dec 01/14/22, CT few days before - remains off ASA and plavix       - ok to start ASA, but not plavix per neurosurgery according to family report      2.PAD - multiple prior interventions Left SFA-pop stenting by Dr. Myra Gianotti Right great toe ray amputation by Dr. Arbie Cookey Right SFA atherectomy and stenting by Dr. Darrick Penna     3. Carotid stenosis bilateral carotid duplex ultrasound demonstrated greater than 80% stenosis of the right internal carotid artery.  He had a follow-up CT a demonstrating roughly 80% stenosis     4. Pressure ulcer - admit 03/2022 with left buttock/sacrum ulcer  5. CKD  - Cr up to 1.7 from 03/2022 admission.  - repeat labs with pcp Thursday  Past Medical History:  Diagnosis Date   Arthritis    "hands" (12/27/2012)   High cholesterol    Hypertension    Neuropathic pain    PAD (peripheral artery disease) (HCC)     Psoriasis    Stroke (HCC) 1999   "still have some speech problems at times; sometimes forget what I was going to say" (12/27/2012)   Type II diabetes mellitus (HCC)    Ulcer    Left ankle/leg     Allergies  Allergen Reactions   Oxycodone Nausea And Vomiting   Glipizide     Bad headache and blurred vision   Bactrim [Sulfamethoxazole-Trimethoprim] Itching and Rash     Current Outpatient Medications  Medication Sig Dispense Refill   empagliflozin (JARDIANCE) 10 MG TABS tablet Take 1 tablet (10 mg total) by mouth daily. 90 tablet 3   acetaminophen (TYLENOL) 325 MG tablet Take 2 tablets (650 mg total) by mouth 4 (four) times daily -  with meals and at bedtime. 100 tablet 0   aspirin EC 81 MG tablet Take 1 tablet (81 mg total) by mouth daily. Swallow whole.     atorvastatin (LIPITOR) 20 MG tablet Take 1 tablet (20 mg total) by mouth at bedtime. Too soon to refill 30 tablet 0   bisoprolol (ZEBETA) 5 MG tablet Take 0.5 tablets (2.5 mg total) by mouth daily. 30 tablet 0   Dulaglutide (TRULICITY) 1.5 MG/0.5ML SOPN Inject 1.5 mg into the skin once a week.     fluticasone (FLONASE) 50 MCG/ACT nasal spray Place 2 sprays into both nostrils daily  as needed for allergies.     furosemide (LASIX) 20 MG tablet Take 1 tablet (20 mg total) by mouth daily. (Patient taking differently: Take 20 mg by mouth 2 (two) times daily.) 30 tablet 0   gabapentin (NEURONTIN) 600 MG tablet Take 1 tablet (600 mg total) by mouth every evening. 30 tablet 0   losartan (COZAAR) 25 MG tablet Take 0.5 tablets (12.5 mg total) by mouth daily. Too soon to refill 30 tablet 0   methocarbamol (ROBAXIN) 750 MG tablet Take 1 tablet (750 mg total) by mouth every 8 (eight) hours. 90 tablet 0   NOVOLIN 70/30 RELION (70-30) 100 UNIT/ML injection Inject 16 Units into the skin every evening. 5 mL 12   Podiatric Products (GOLD BOND FOOT) CREA Apply 1 application topically 2 (two) times daily as needed (DRY SKIN). DIABETIC CREAM      polyethylene glycol powder (GLYCOLAX/MIRALAX) 17 GM/SCOOP powder Take 17 g by mouth daily. 238 g 0   senna-docusate (SENOKOT-S) 8.6-50 MG tablet Take 1 tablet by mouth 2 (two) times daily. 60 tablet 0   traZODone (DESYREL) 50 MG tablet Take 0.5 tablets (25 mg total) by mouth at bedtime as needed for sleep. 15 tablet 0   triamcinolone cream (KENALOG) 0.1 % Apply 1 Application topically 2 (two) times daily. Mix with OTC eucerin 1:1. 120 g 0   No current facility-administered medications for this visit.     Past Surgical History:  Procedure Laterality Date   ABDOMINAL AORTAGRAM N/A 03/30/2012   Procedure: ABDOMINAL AORTAGRAM;  Surgeon: Nada Libman, MD;  Location: [facility] CATH LAB;  Service: Cardiovascular;  Laterality: N/A;   ABDOMINAL AORTAGRAM N/A 12/27/2012   Procedure: ABDOMINAL Ronny Flurry;  Surgeon: Nada Libman, MD;  Location: [facility] CATH LAB;  Service: Cardiovascular;  Laterality: N/A;   ABDOMINAL AORTOGRAM W/LOWER EXTREMITY N/A 04/05/2017   Procedure: ABDOMINAL AORTOGRAM W/LOWER EXTREMITY;  Surgeon: Sherren Kerns, MD;  Location: [facility] INVASIVE CV LAB;  Service: Cardiovascular;  Laterality: N/A;   AMPUTATION Right 04/26/2017   Procedure: AMPUTATION TRANSMETATARSAL RIGHT GREAT TOE;  Surgeon: Larina Earthly, MD;  Location: [facility] OR;  Service: Vascular;  Laterality: Right;   ANGIOPLASTY / STENTING FEMORAL Left 12/27/2012   APPLICATION OF WOUND VAC Right 04/26/2017   Procedure: APPLICATION OF WOUND VAC;  Surgeon: Larina Earthly, MD;  Location: [facility] OR;  Service: Vascular;  Laterality: Right;   FEMORAL ARTERY STENT  03/30/2012   LOWER EXTREMITY ANGIOGRAM  12/27/2012   Procedure: LOWER EXTREMITY ANGIOGRAM;  Surgeon: Nada Libman, MD;  Location: [facility] CATH LAB;  Service: Cardiovascular;;   LOWER EXTREMITY ANGIOGRAPHY N/A 04/19/2018   Procedure: LOWER EXTREMITY ANGIOGRAPHY;  Surgeon: Nada Libman, MD;  Location: [facility] INVASIVE CV LAB;  Service: Cardiovascular;  Laterality: N/A;   PERIPHERAL VASCULAR ATHERECTOMY  Right 04/05/2017   Procedure: PERIPHERAL VASCULAR ATHERECTOMY;  Surgeon: Sherren Kerns, MD;  Location: [facility] INVASIVE CV LAB;  Service: Cardiovascular;  Laterality: Right;  superficial femoral   PERIPHERAL VASCULAR BALLOON ANGIOPLASTY  04/19/2018   Procedure: PERIPHERAL VASCULAR BALLOON ANGIOPLASTY;  Surgeon: Nada Libman, MD;  Location: [facility] INVASIVE CV LAB;  Service: Cardiovascular;;   PERIPHERAL VASCULAR INTERVENTION Right 04/05/2017   Procedure: PERIPHERAL VASCULAR INTERVENTION;  Surgeon: Sherren Kerns, MD;  Location[facility] INVASIVE CV LAB;  Service: Cardiovascular;  Laterality: Right;   Superficial femorl and external iliac   POPLITEAL ARTERY STENT     TONSILLECTOMY AND ADENOIDECTOMY       Allergies  Allergen Reactions  Oxycodone Nausea And Vomiting   Glipizide     Bad headache and blurred vision   Bactrim [Sulfamethoxazole-Trimethoprim] Itching and Rash      Family History  Problem Relation Age of Onset   Diabetes Sister        Bilateral amputation of lower legs   Diabetes Sister    Heart disease Sister 71       Heart disease before age 40   Hypertension Sister    Heart attack Sister    Hyperlipidemia Sister    Hypertension Father    Hyperlipidemia Father    Hyperlipidemia Mother    Hypertension Mother    Hypertension Daughter      Social History James Dougherty reports that he quit smoking about 11 years ago. His smoking use included cigarettes. He has a 135.00 pack-year smoking history. He has never been exposed to tobacco smoke. He has never used smokeless tobacco. James Dougherty reports that he does not currently use alcohol.   Review of Systems CONSTITUTIONAL: No weight loss, fever, chills, weakness or fatigue.  HEENT: Eyes: No visual loss, blurred vision, double vision or yellow sclerae.No hearing loss, sneezing, congestion, runny nose or sore throat.  SKIN: No rash or itching.  CARDIOVASCULAR: per hpi RESPIRATORY: No shortness of breath, cough or sputum.   GASTROINTESTINAL: No anorexia, nausea, vomiting or diarrhea. No abdominal pain or blood.  GENITOURINARY: No burning on urination, no polyuria NEUROLOGICAL: No headache, dizziness, syncope, paralysis, ataxia, numbness or tingling in the extremities. No change in bowel or bladder control.  MUSCULOSKELETAL: No muscle, back pain, joint pain or stiffness.  LYMPHATICS: No enlarged nodes. No history of splenectomy.  PSYCHIATRIC: No history of depression or anxiety.  ENDOCRINOLOGIC: No reports of sweating, cold or heat intolerance. No polyuria or polydipsia.  [name]   Physical Examination Today's Vitals   06/09/22 1352  BP: 120/80  Pulse: 92  SpO2: 99%  Weight: 214 lb 6.4 oz (97.3 kg)  Height: 5\' 8"  (1.727 m)   Body mass index is 32.6 kg/m.  Gen: resting comfortably, no acute distress HEENT: no scleral icterus, pupils equal round and reactive, no palptable cervical adenopathy,  CV: RRR, no m/rg no jvd Resp: Clear to auscultation bilaterally GI: abdomen is soft, non-tender, non-distended, normal bowel sounds, no hepatosplenomegaly MSK: extremities are warm, no edema.  Skin: warm, no rash Neuro:  no focal deficits Psych: appropriate affect   Diagnostic Studies  04/2021 nuclear stress  Findings are consistent with prior myocardial infarction with peri-infarct ischemia. The study is high risk.   No ST deviation was noted. Arrhythmias during stress: rare PACs, rare PVCs. The ECG was negative for ischemia.   LV perfusion is abnormal.  Small, moderate intensity, apical to basal anteroseptal defect that is fixed with the exception of partial reversibility at the very apex.  There is also a small, mild intensity inferior defect from apex to base that is most prominent on rest imaging and consistent with soft tissue attenuation, although cannot completely exclude component of scar.   Left ventricular function is abnormal. Global function is severely reduced. Nuclear stress EF: 23 %. End diastolic  cavity size is moderately enlarged. End systolic cavity size is moderately enlarged.   High risk study with calculated LVEF of 23% and moderate LV chamber dilatation.  Possibility of anteroseptal scar with mild apical ischemia raises possibility of ischemic cardiomyopathy.  Suggest echocardiogram for further evaluation of cardiac structure and function.     Assessment and Plan   1.Chronic systolic HF -  side effects on toprol, tolerating bisoprolol - high K on both low dose losartan and separtely on aldactone, discontinued - tolerating jardiance.  - he has turned down cath, also from family report due to SDH neurosurg has recommended against restarting plavix at any point but OK with aspirin alone. With recent bed sores, decreased renal function would also argue against cath.   - no symptoms, continue current meds - may repeat echo next visit reassesss LVEF  2.PAD - continue medical therapy   Antoine Poche, M.D.

## 2022-06-09 NOTE — Patient Instructions (Addendum)
Medication Instructions:  ?Continue all current medications. ? ?Labwork: ?none ? ?Testing/Procedures: ?none ? ?Follow-Up: ?September  ? ?Any Other Special Instructions Will Be Listed Below (If Applicable). ? ? ?If you need a refill on your cardiac medications before your next appointment, please call your pharmacy. ? ?

## 2022-06-10 DIAGNOSIS — I87311 Chronic venous hypertension (idiopathic) with ulcer of right lower extremity: Secondary | ICD-10-CM | POA: Diagnosis not present

## 2022-06-10 DIAGNOSIS — Z794 Long term (current) use of insulin: Secondary | ICD-10-CM | POA: Diagnosis not present

## 2022-06-10 DIAGNOSIS — M810 Age-related osteoporosis without current pathological fracture: Secondary | ICD-10-CM | POA: Diagnosis not present

## 2022-06-10 DIAGNOSIS — L8942 Pressure ulcer of contiguous site of back, buttock and hip, stage 2: Secondary | ICD-10-CM | POA: Diagnosis not present

## 2022-06-10 DIAGNOSIS — L89312 Pressure ulcer of right buttock, stage 2: Secondary | ICD-10-CM | POA: Diagnosis not present

## 2022-06-10 DIAGNOSIS — I5032 Chronic diastolic (congestive) heart failure: Secondary | ICD-10-CM | POA: Diagnosis not present

## 2022-06-10 DIAGNOSIS — I872 Venous insufficiency (chronic) (peripheral): Secondary | ICD-10-CM | POA: Diagnosis not present

## 2022-06-10 DIAGNOSIS — L97812 Non-pressure chronic ulcer of other part of right lower leg with fat layer exposed: Secondary | ICD-10-CM | POA: Diagnosis not present

## 2022-06-10 DIAGNOSIS — Z7985 Long-term (current) use of injectable non-insulin antidiabetic drugs: Secondary | ICD-10-CM | POA: Diagnosis not present

## 2022-06-10 DIAGNOSIS — E1142 Type 2 diabetes mellitus with diabetic polyneuropathy: Secondary | ICD-10-CM | POA: Diagnosis not present

## 2022-06-10 DIAGNOSIS — Z7984 Long term (current) use of oral hypoglycemic drugs: Secondary | ICD-10-CM | POA: Diagnosis not present

## 2022-06-10 DIAGNOSIS — I130 Hypertensive heart and chronic kidney disease with heart failure and stage 1 through stage 4 chronic kidney disease, or unspecified chronic kidney disease: Secondary | ICD-10-CM | POA: Diagnosis not present

## 2022-06-10 DIAGNOSIS — E1122 Type 2 diabetes mellitus with diabetic chronic kidney disease: Secondary | ICD-10-CM | POA: Diagnosis not present

## 2022-06-10 DIAGNOSIS — M6281 Muscle weakness (generalized): Secondary | ICD-10-CM | POA: Diagnosis not present

## 2022-06-10 DIAGNOSIS — I890 Lymphedema, not elsewhere classified: Secondary | ICD-10-CM | POA: Diagnosis not present

## 2022-06-10 DIAGNOSIS — I252 Old myocardial infarction: Secondary | ICD-10-CM | POA: Diagnosis not present

## 2022-06-10 DIAGNOSIS — Z9181 History of falling: Secondary | ICD-10-CM | POA: Diagnosis not present

## 2022-06-10 DIAGNOSIS — I255 Ischemic cardiomyopathy: Secondary | ICD-10-CM | POA: Diagnosis not present

## 2022-06-10 DIAGNOSIS — E1151 Type 2 diabetes mellitus with diabetic peripheral angiopathy without gangrene: Secondary | ICD-10-CM | POA: Diagnosis not present

## 2022-06-10 DIAGNOSIS — N1831 Chronic kidney disease, stage 3a: Secondary | ICD-10-CM | POA: Diagnosis not present

## 2022-06-10 DIAGNOSIS — E785 Hyperlipidemia, unspecified: Secondary | ICD-10-CM | POA: Diagnosis not present

## 2022-06-10 DIAGNOSIS — L89323 Pressure ulcer of left buttock, stage 3: Secondary | ICD-10-CM | POA: Diagnosis not present

## 2022-06-11 DIAGNOSIS — R030 Elevated blood-pressure reading, without diagnosis of hypertension: Secondary | ICD-10-CM | POA: Diagnosis not present

## 2022-06-11 DIAGNOSIS — E1129 Type 2 diabetes mellitus with other diabetic kidney complication: Secondary | ICD-10-CM | POA: Diagnosis not present

## 2022-06-11 DIAGNOSIS — L03115 Cellulitis of right lower limb: Secondary | ICD-10-CM | POA: Diagnosis not present

## 2022-06-11 DIAGNOSIS — E1151 Type 2 diabetes mellitus with diabetic peripheral angiopathy without gangrene: Secondary | ICD-10-CM | POA: Diagnosis not present

## 2022-06-11 DIAGNOSIS — N1832 Chronic kidney disease, stage 3b: Secondary | ICD-10-CM | POA: Diagnosis not present

## 2022-06-12 DIAGNOSIS — E1122 Type 2 diabetes mellitus with diabetic chronic kidney disease: Secondary | ICD-10-CM | POA: Diagnosis not present

## 2022-06-12 DIAGNOSIS — I252 Old myocardial infarction: Secondary | ICD-10-CM | POA: Diagnosis not present

## 2022-06-12 DIAGNOSIS — Z7985 Long-term (current) use of injectable non-insulin antidiabetic drugs: Secondary | ICD-10-CM | POA: Diagnosis not present

## 2022-06-12 DIAGNOSIS — I87311 Chronic venous hypertension (idiopathic) with ulcer of right lower extremity: Secondary | ICD-10-CM | POA: Diagnosis not present

## 2022-06-12 DIAGNOSIS — I5032 Chronic diastolic (congestive) heart failure: Secondary | ICD-10-CM | POA: Diagnosis not present

## 2022-06-12 DIAGNOSIS — L8942 Pressure ulcer of contiguous site of back, buttock and hip, stage 2: Secondary | ICD-10-CM | POA: Diagnosis not present

## 2022-06-12 DIAGNOSIS — M6281 Muscle weakness (generalized): Secondary | ICD-10-CM | POA: Diagnosis not present

## 2022-06-12 DIAGNOSIS — N1831 Chronic kidney disease, stage 3a: Secondary | ICD-10-CM | POA: Diagnosis not present

## 2022-06-12 DIAGNOSIS — L89312 Pressure ulcer of right buttock, stage 2: Secondary | ICD-10-CM | POA: Diagnosis not present

## 2022-06-12 DIAGNOSIS — Z9181 History of falling: Secondary | ICD-10-CM | POA: Diagnosis not present

## 2022-06-12 DIAGNOSIS — I255 Ischemic cardiomyopathy: Secondary | ICD-10-CM | POA: Diagnosis not present

## 2022-06-12 DIAGNOSIS — E1151 Type 2 diabetes mellitus with diabetic peripheral angiopathy without gangrene: Secondary | ICD-10-CM | POA: Diagnosis not present

## 2022-06-12 DIAGNOSIS — I890 Lymphedema, not elsewhere classified: Secondary | ICD-10-CM | POA: Diagnosis not present

## 2022-06-12 DIAGNOSIS — E1142 Type 2 diabetes mellitus with diabetic polyneuropathy: Secondary | ICD-10-CM | POA: Diagnosis not present

## 2022-06-12 DIAGNOSIS — Z7984 Long term (current) use of oral hypoglycemic drugs: Secondary | ICD-10-CM | POA: Diagnosis not present

## 2022-06-12 DIAGNOSIS — I130 Hypertensive heart and chronic kidney disease with heart failure and stage 1 through stage 4 chronic kidney disease, or unspecified chronic kidney disease: Secondary | ICD-10-CM | POA: Diagnosis not present

## 2022-06-12 DIAGNOSIS — M810 Age-related osteoporosis without current pathological fracture: Secondary | ICD-10-CM | POA: Diagnosis not present

## 2022-06-12 DIAGNOSIS — E785 Hyperlipidemia, unspecified: Secondary | ICD-10-CM | POA: Diagnosis not present

## 2022-06-12 DIAGNOSIS — Z794 Long term (current) use of insulin: Secondary | ICD-10-CM | POA: Diagnosis not present

## 2022-06-12 DIAGNOSIS — L89323 Pressure ulcer of left buttock, stage 3: Secondary | ICD-10-CM | POA: Diagnosis not present

## 2022-06-12 DIAGNOSIS — I872 Venous insufficiency (chronic) (peripheral): Secondary | ICD-10-CM | POA: Diagnosis not present

## 2022-06-12 DIAGNOSIS — L97812 Non-pressure chronic ulcer of other part of right lower leg with fat layer exposed: Secondary | ICD-10-CM | POA: Diagnosis not present

## 2022-06-15 DIAGNOSIS — Z7985 Long-term (current) use of injectable non-insulin antidiabetic drugs: Secondary | ICD-10-CM | POA: Diagnosis not present

## 2022-06-15 DIAGNOSIS — E1151 Type 2 diabetes mellitus with diabetic peripheral angiopathy without gangrene: Secondary | ICD-10-CM | POA: Diagnosis not present

## 2022-06-15 DIAGNOSIS — M6281 Muscle weakness (generalized): Secondary | ICD-10-CM | POA: Diagnosis not present

## 2022-06-15 DIAGNOSIS — N1831 Chronic kidney disease, stage 3a: Secondary | ICD-10-CM | POA: Diagnosis not present

## 2022-06-15 DIAGNOSIS — E785 Hyperlipidemia, unspecified: Secondary | ICD-10-CM | POA: Diagnosis not present

## 2022-06-15 DIAGNOSIS — Z7984 Long term (current) use of oral hypoglycemic drugs: Secondary | ICD-10-CM | POA: Diagnosis not present

## 2022-06-15 DIAGNOSIS — L89323 Pressure ulcer of left buttock, stage 3: Secondary | ICD-10-CM | POA: Diagnosis not present

## 2022-06-15 DIAGNOSIS — Z9181 History of falling: Secondary | ICD-10-CM | POA: Diagnosis not present

## 2022-06-15 DIAGNOSIS — I872 Venous insufficiency (chronic) (peripheral): Secondary | ICD-10-CM | POA: Diagnosis not present

## 2022-06-15 DIAGNOSIS — I890 Lymphedema, not elsewhere classified: Secondary | ICD-10-CM | POA: Diagnosis not present

## 2022-06-15 DIAGNOSIS — I252 Old myocardial infarction: Secondary | ICD-10-CM | POA: Diagnosis not present

## 2022-06-15 DIAGNOSIS — E1142 Type 2 diabetes mellitus with diabetic polyneuropathy: Secondary | ICD-10-CM | POA: Diagnosis not present

## 2022-06-15 DIAGNOSIS — I130 Hypertensive heart and chronic kidney disease with heart failure and stage 1 through stage 4 chronic kidney disease, or unspecified chronic kidney disease: Secondary | ICD-10-CM | POA: Diagnosis not present

## 2022-06-15 DIAGNOSIS — I5032 Chronic diastolic (congestive) heart failure: Secondary | ICD-10-CM | POA: Diagnosis not present

## 2022-06-15 DIAGNOSIS — L8942 Pressure ulcer of contiguous site of back, buttock and hip, stage 2: Secondary | ICD-10-CM | POA: Diagnosis not present

## 2022-06-15 DIAGNOSIS — I255 Ischemic cardiomyopathy: Secondary | ICD-10-CM | POA: Diagnosis not present

## 2022-06-15 DIAGNOSIS — M810 Age-related osteoporosis without current pathological fracture: Secondary | ICD-10-CM | POA: Diagnosis not present

## 2022-06-15 DIAGNOSIS — Z794 Long term (current) use of insulin: Secondary | ICD-10-CM | POA: Diagnosis not present

## 2022-06-15 DIAGNOSIS — L97812 Non-pressure chronic ulcer of other part of right lower leg with fat layer exposed: Secondary | ICD-10-CM | POA: Diagnosis not present

## 2022-06-15 DIAGNOSIS — E1122 Type 2 diabetes mellitus with diabetic chronic kidney disease: Secondary | ICD-10-CM | POA: Diagnosis not present

## 2022-06-15 DIAGNOSIS — L89312 Pressure ulcer of right buttock, stage 2: Secondary | ICD-10-CM | POA: Diagnosis not present

## 2022-06-15 DIAGNOSIS — I87311 Chronic venous hypertension (idiopathic) with ulcer of right lower extremity: Secondary | ICD-10-CM | POA: Diagnosis not present

## 2022-06-16 DIAGNOSIS — I872 Venous insufficiency (chronic) (peripheral): Secondary | ICD-10-CM | POA: Diagnosis not present

## 2022-06-16 DIAGNOSIS — L97812 Non-pressure chronic ulcer of other part of right lower leg with fat layer exposed: Secondary | ICD-10-CM | POA: Diagnosis not present

## 2022-06-16 DIAGNOSIS — M810 Age-related osteoporosis without current pathological fracture: Secondary | ICD-10-CM | POA: Diagnosis not present

## 2022-06-16 DIAGNOSIS — E1122 Type 2 diabetes mellitus with diabetic chronic kidney disease: Secondary | ICD-10-CM | POA: Diagnosis not present

## 2022-06-16 DIAGNOSIS — E1142 Type 2 diabetes mellitus with diabetic polyneuropathy: Secondary | ICD-10-CM | POA: Diagnosis not present

## 2022-06-16 DIAGNOSIS — Z794 Long term (current) use of insulin: Secondary | ICD-10-CM | POA: Diagnosis not present

## 2022-06-16 DIAGNOSIS — I252 Old myocardial infarction: Secondary | ICD-10-CM | POA: Diagnosis not present

## 2022-06-16 DIAGNOSIS — L89323 Pressure ulcer of left buttock, stage 3: Secondary | ICD-10-CM | POA: Diagnosis not present

## 2022-06-16 DIAGNOSIS — Z9181 History of falling: Secondary | ICD-10-CM | POA: Diagnosis not present

## 2022-06-16 DIAGNOSIS — N1831 Chronic kidney disease, stage 3a: Secondary | ICD-10-CM | POA: Diagnosis not present

## 2022-06-16 DIAGNOSIS — I255 Ischemic cardiomyopathy: Secondary | ICD-10-CM | POA: Diagnosis not present

## 2022-06-16 DIAGNOSIS — I87311 Chronic venous hypertension (idiopathic) with ulcer of right lower extremity: Secondary | ICD-10-CM | POA: Diagnosis not present

## 2022-06-16 DIAGNOSIS — E1151 Type 2 diabetes mellitus with diabetic peripheral angiopathy without gangrene: Secondary | ICD-10-CM | POA: Diagnosis not present

## 2022-06-16 DIAGNOSIS — M6281 Muscle weakness (generalized): Secondary | ICD-10-CM | POA: Diagnosis not present

## 2022-06-16 DIAGNOSIS — L8942 Pressure ulcer of contiguous site of back, buttock and hip, stage 2: Secondary | ICD-10-CM | POA: Diagnosis not present

## 2022-06-16 DIAGNOSIS — E785 Hyperlipidemia, unspecified: Secondary | ICD-10-CM | POA: Diagnosis not present

## 2022-06-16 DIAGNOSIS — Z7985 Long-term (current) use of injectable non-insulin antidiabetic drugs: Secondary | ICD-10-CM | POA: Diagnosis not present

## 2022-06-16 DIAGNOSIS — I5032 Chronic diastolic (congestive) heart failure: Secondary | ICD-10-CM | POA: Diagnosis not present

## 2022-06-16 DIAGNOSIS — L89312 Pressure ulcer of right buttock, stage 2: Secondary | ICD-10-CM | POA: Diagnosis not present

## 2022-06-16 DIAGNOSIS — Z7984 Long term (current) use of oral hypoglycemic drugs: Secondary | ICD-10-CM | POA: Diagnosis not present

## 2022-06-16 DIAGNOSIS — I130 Hypertensive heart and chronic kidney disease with heart failure and stage 1 through stage 4 chronic kidney disease, or unspecified chronic kidney disease: Secondary | ICD-10-CM | POA: Diagnosis not present

## 2022-06-16 DIAGNOSIS — I890 Lymphedema, not elsewhere classified: Secondary | ICD-10-CM | POA: Diagnosis not present

## 2022-06-17 DIAGNOSIS — M6281 Muscle weakness (generalized): Secondary | ICD-10-CM | POA: Diagnosis not present

## 2022-06-17 DIAGNOSIS — L89323 Pressure ulcer of left buttock, stage 3: Secondary | ICD-10-CM | POA: Diagnosis not present

## 2022-06-19 DIAGNOSIS — N1831 Chronic kidney disease, stage 3a: Secondary | ICD-10-CM | POA: Diagnosis not present

## 2022-06-19 DIAGNOSIS — L8942 Pressure ulcer of contiguous site of back, buttock and hip, stage 2: Secondary | ICD-10-CM | POA: Diagnosis not present

## 2022-06-19 DIAGNOSIS — I255 Ischemic cardiomyopathy: Secondary | ICD-10-CM | POA: Diagnosis not present

## 2022-06-19 DIAGNOSIS — I890 Lymphedema, not elsewhere classified: Secondary | ICD-10-CM | POA: Diagnosis not present

## 2022-06-19 DIAGNOSIS — I130 Hypertensive heart and chronic kidney disease with heart failure and stage 1 through stage 4 chronic kidney disease, or unspecified chronic kidney disease: Secondary | ICD-10-CM | POA: Diagnosis not present

## 2022-06-19 DIAGNOSIS — L89323 Pressure ulcer of left buttock, stage 3: Secondary | ICD-10-CM | POA: Diagnosis not present

## 2022-06-19 DIAGNOSIS — E1142 Type 2 diabetes mellitus with diabetic polyneuropathy: Secondary | ICD-10-CM | POA: Diagnosis not present

## 2022-06-19 DIAGNOSIS — L97812 Non-pressure chronic ulcer of other part of right lower leg with fat layer exposed: Secondary | ICD-10-CM | POA: Diagnosis not present

## 2022-06-19 DIAGNOSIS — L89312 Pressure ulcer of right buttock, stage 2: Secondary | ICD-10-CM | POA: Diagnosis not present

## 2022-06-19 DIAGNOSIS — Z794 Long term (current) use of insulin: Secondary | ICD-10-CM | POA: Diagnosis not present

## 2022-06-19 DIAGNOSIS — E1122 Type 2 diabetes mellitus with diabetic chronic kidney disease: Secondary | ICD-10-CM | POA: Diagnosis not present

## 2022-06-19 DIAGNOSIS — I5032 Chronic diastolic (congestive) heart failure: Secondary | ICD-10-CM | POA: Diagnosis not present

## 2022-06-19 DIAGNOSIS — Z7985 Long-term (current) use of injectable non-insulin antidiabetic drugs: Secondary | ICD-10-CM | POA: Diagnosis not present

## 2022-06-19 DIAGNOSIS — Z7984 Long term (current) use of oral hypoglycemic drugs: Secondary | ICD-10-CM | POA: Diagnosis not present

## 2022-06-19 DIAGNOSIS — E785 Hyperlipidemia, unspecified: Secondary | ICD-10-CM | POA: Diagnosis not present

## 2022-06-19 DIAGNOSIS — M6281 Muscle weakness (generalized): Secondary | ICD-10-CM | POA: Diagnosis not present

## 2022-06-19 DIAGNOSIS — I252 Old myocardial infarction: Secondary | ICD-10-CM | POA: Diagnosis not present

## 2022-06-19 DIAGNOSIS — Z9181 History of falling: Secondary | ICD-10-CM | POA: Diagnosis not present

## 2022-06-19 DIAGNOSIS — E1151 Type 2 diabetes mellitus with diabetic peripheral angiopathy without gangrene: Secondary | ICD-10-CM | POA: Diagnosis not present

## 2022-06-19 DIAGNOSIS — I872 Venous insufficiency (chronic) (peripheral): Secondary | ICD-10-CM | POA: Diagnosis not present

## 2022-06-19 DIAGNOSIS — M810 Age-related osteoporosis without current pathological fracture: Secondary | ICD-10-CM | POA: Diagnosis not present

## 2022-06-19 DIAGNOSIS — I87311 Chronic venous hypertension (idiopathic) with ulcer of right lower extremity: Secondary | ICD-10-CM | POA: Diagnosis not present

## 2022-06-22 DIAGNOSIS — I872 Venous insufficiency (chronic) (peripheral): Secondary | ICD-10-CM | POA: Diagnosis not present

## 2022-06-22 DIAGNOSIS — I890 Lymphedema, not elsewhere classified: Secondary | ICD-10-CM | POA: Diagnosis not present

## 2022-06-22 DIAGNOSIS — I130 Hypertensive heart and chronic kidney disease with heart failure and stage 1 through stage 4 chronic kidney disease, or unspecified chronic kidney disease: Secondary | ICD-10-CM | POA: Diagnosis not present

## 2022-06-22 DIAGNOSIS — Z7985 Long-term (current) use of injectable non-insulin antidiabetic drugs: Secondary | ICD-10-CM | POA: Diagnosis not present

## 2022-06-22 DIAGNOSIS — L89323 Pressure ulcer of left buttock, stage 3: Secondary | ICD-10-CM | POA: Diagnosis not present

## 2022-06-22 DIAGNOSIS — E785 Hyperlipidemia, unspecified: Secondary | ICD-10-CM | POA: Diagnosis not present

## 2022-06-22 DIAGNOSIS — I255 Ischemic cardiomyopathy: Secondary | ICD-10-CM | POA: Diagnosis not present

## 2022-06-22 DIAGNOSIS — E1151 Type 2 diabetes mellitus with diabetic peripheral angiopathy without gangrene: Secondary | ICD-10-CM | POA: Diagnosis not present

## 2022-06-22 DIAGNOSIS — E1142 Type 2 diabetes mellitus with diabetic polyneuropathy: Secondary | ICD-10-CM | POA: Diagnosis not present

## 2022-06-22 DIAGNOSIS — L89312 Pressure ulcer of right buttock, stage 2: Secondary | ICD-10-CM | POA: Diagnosis not present

## 2022-06-22 DIAGNOSIS — Z7984 Long term (current) use of oral hypoglycemic drugs: Secondary | ICD-10-CM | POA: Diagnosis not present

## 2022-06-22 DIAGNOSIS — L8942 Pressure ulcer of contiguous site of back, buttock and hip, stage 2: Secondary | ICD-10-CM | POA: Diagnosis not present

## 2022-06-22 DIAGNOSIS — Z9181 History of falling: Secondary | ICD-10-CM | POA: Diagnosis not present

## 2022-06-22 DIAGNOSIS — M810 Age-related osteoporosis without current pathological fracture: Secondary | ICD-10-CM | POA: Diagnosis not present

## 2022-06-22 DIAGNOSIS — Z794 Long term (current) use of insulin: Secondary | ICD-10-CM | POA: Diagnosis not present

## 2022-06-22 DIAGNOSIS — I5032 Chronic diastolic (congestive) heart failure: Secondary | ICD-10-CM | POA: Diagnosis not present

## 2022-06-22 DIAGNOSIS — E1122 Type 2 diabetes mellitus with diabetic chronic kidney disease: Secondary | ICD-10-CM | POA: Diagnosis not present

## 2022-06-22 DIAGNOSIS — I252 Old myocardial infarction: Secondary | ICD-10-CM | POA: Diagnosis not present

## 2022-06-22 DIAGNOSIS — N1831 Chronic kidney disease, stage 3a: Secondary | ICD-10-CM | POA: Diagnosis not present

## 2022-06-22 DIAGNOSIS — M6281 Muscle weakness (generalized): Secondary | ICD-10-CM | POA: Diagnosis not present

## 2022-06-22 DIAGNOSIS — I87311 Chronic venous hypertension (idiopathic) with ulcer of right lower extremity: Secondary | ICD-10-CM | POA: Diagnosis not present

## 2022-06-22 DIAGNOSIS — L97812 Non-pressure chronic ulcer of other part of right lower leg with fat layer exposed: Secondary | ICD-10-CM | POA: Diagnosis not present

## 2022-06-24 DIAGNOSIS — L97812 Non-pressure chronic ulcer of other part of right lower leg with fat layer exposed: Secondary | ICD-10-CM | POA: Diagnosis not present

## 2022-06-24 DIAGNOSIS — Z794 Long term (current) use of insulin: Secondary | ICD-10-CM | POA: Diagnosis not present

## 2022-06-24 DIAGNOSIS — Z7985 Long-term (current) use of injectable non-insulin antidiabetic drugs: Secondary | ICD-10-CM | POA: Diagnosis not present

## 2022-06-24 DIAGNOSIS — E1151 Type 2 diabetes mellitus with diabetic peripheral angiopathy without gangrene: Secondary | ICD-10-CM | POA: Diagnosis not present

## 2022-06-24 DIAGNOSIS — I5032 Chronic diastolic (congestive) heart failure: Secondary | ICD-10-CM | POA: Diagnosis not present

## 2022-06-24 DIAGNOSIS — E1142 Type 2 diabetes mellitus with diabetic polyneuropathy: Secondary | ICD-10-CM | POA: Diagnosis not present

## 2022-06-24 DIAGNOSIS — E1122 Type 2 diabetes mellitus with diabetic chronic kidney disease: Secondary | ICD-10-CM | POA: Diagnosis not present

## 2022-06-24 DIAGNOSIS — I252 Old myocardial infarction: Secondary | ICD-10-CM | POA: Diagnosis not present

## 2022-06-24 DIAGNOSIS — I872 Venous insufficiency (chronic) (peripheral): Secondary | ICD-10-CM | POA: Diagnosis not present

## 2022-06-24 DIAGNOSIS — L89312 Pressure ulcer of right buttock, stage 2: Secondary | ICD-10-CM | POA: Diagnosis not present

## 2022-06-24 DIAGNOSIS — I130 Hypertensive heart and chronic kidney disease with heart failure and stage 1 through stage 4 chronic kidney disease, or unspecified chronic kidney disease: Secondary | ICD-10-CM | POA: Diagnosis not present

## 2022-06-24 DIAGNOSIS — M6281 Muscle weakness (generalized): Secondary | ICD-10-CM | POA: Diagnosis not present

## 2022-06-24 DIAGNOSIS — L89323 Pressure ulcer of left buttock, stage 3: Secondary | ICD-10-CM | POA: Diagnosis not present

## 2022-06-24 DIAGNOSIS — Z9181 History of falling: Secondary | ICD-10-CM | POA: Diagnosis not present

## 2022-06-24 DIAGNOSIS — L8942 Pressure ulcer of contiguous site of back, buttock and hip, stage 2: Secondary | ICD-10-CM | POA: Diagnosis not present

## 2022-06-24 DIAGNOSIS — I890 Lymphedema, not elsewhere classified: Secondary | ICD-10-CM | POA: Diagnosis not present

## 2022-06-24 DIAGNOSIS — I87311 Chronic venous hypertension (idiopathic) with ulcer of right lower extremity: Secondary | ICD-10-CM | POA: Diagnosis not present

## 2022-06-24 DIAGNOSIS — M810 Age-related osteoporosis without current pathological fracture: Secondary | ICD-10-CM | POA: Diagnosis not present

## 2022-06-24 DIAGNOSIS — I255 Ischemic cardiomyopathy: Secondary | ICD-10-CM | POA: Diagnosis not present

## 2022-06-24 DIAGNOSIS — E785 Hyperlipidemia, unspecified: Secondary | ICD-10-CM | POA: Diagnosis not present

## 2022-06-24 DIAGNOSIS — N1831 Chronic kidney disease, stage 3a: Secondary | ICD-10-CM | POA: Diagnosis not present

## 2022-06-24 DIAGNOSIS — Z7984 Long term (current) use of oral hypoglycemic drugs: Secondary | ICD-10-CM | POA: Diagnosis not present

## 2022-06-26 DIAGNOSIS — Z794 Long term (current) use of insulin: Secondary | ICD-10-CM | POA: Diagnosis not present

## 2022-06-26 DIAGNOSIS — E1165 Type 2 diabetes mellitus with hyperglycemia: Secondary | ICD-10-CM | POA: Diagnosis not present

## 2022-06-26 DIAGNOSIS — I872 Venous insufficiency (chronic) (peripheral): Secondary | ICD-10-CM | POA: Diagnosis not present

## 2022-06-26 DIAGNOSIS — E785 Hyperlipidemia, unspecified: Secondary | ICD-10-CM | POA: Diagnosis not present

## 2022-06-26 DIAGNOSIS — Z9181 History of falling: Secondary | ICD-10-CM | POA: Diagnosis not present

## 2022-06-26 DIAGNOSIS — E1151 Type 2 diabetes mellitus with diabetic peripheral angiopathy without gangrene: Secondary | ICD-10-CM | POA: Diagnosis not present

## 2022-06-26 DIAGNOSIS — I5032 Chronic diastolic (congestive) heart failure: Secondary | ICD-10-CM | POA: Diagnosis not present

## 2022-06-26 DIAGNOSIS — N183 Chronic kidney disease, stage 3 unspecified: Secondary | ICD-10-CM | POA: Diagnosis not present

## 2022-06-26 DIAGNOSIS — L89312 Pressure ulcer of right buttock, stage 2: Secondary | ICD-10-CM | POA: Diagnosis not present

## 2022-06-26 DIAGNOSIS — Z7984 Long term (current) use of oral hypoglycemic drugs: Secondary | ICD-10-CM | POA: Diagnosis not present

## 2022-06-26 DIAGNOSIS — E1142 Type 2 diabetes mellitus with diabetic polyneuropathy: Secondary | ICD-10-CM | POA: Diagnosis not present

## 2022-06-26 DIAGNOSIS — I252 Old myocardial infarction: Secondary | ICD-10-CM | POA: Diagnosis not present

## 2022-06-26 DIAGNOSIS — E1122 Type 2 diabetes mellitus with diabetic chronic kidney disease: Secondary | ICD-10-CM | POA: Diagnosis not present

## 2022-06-26 DIAGNOSIS — N1831 Chronic kidney disease, stage 3a: Secondary | ICD-10-CM | POA: Diagnosis not present

## 2022-06-26 DIAGNOSIS — I255 Ischemic cardiomyopathy: Secondary | ICD-10-CM | POA: Diagnosis not present

## 2022-06-26 DIAGNOSIS — M810 Age-related osteoporosis without current pathological fracture: Secondary | ICD-10-CM | POA: Diagnosis not present

## 2022-06-26 DIAGNOSIS — L8942 Pressure ulcer of contiguous site of back, buttock and hip, stage 2: Secondary | ICD-10-CM | POA: Diagnosis not present

## 2022-06-26 DIAGNOSIS — L89323 Pressure ulcer of left buttock, stage 3: Secondary | ICD-10-CM | POA: Diagnosis not present

## 2022-06-26 DIAGNOSIS — I890 Lymphedema, not elsewhere classified: Secondary | ICD-10-CM | POA: Diagnosis not present

## 2022-06-26 DIAGNOSIS — L97812 Non-pressure chronic ulcer of other part of right lower leg with fat layer exposed: Secondary | ICD-10-CM | POA: Diagnosis not present

## 2022-06-26 DIAGNOSIS — Z7985 Long-term (current) use of injectable non-insulin antidiabetic drugs: Secondary | ICD-10-CM | POA: Diagnosis not present

## 2022-06-26 DIAGNOSIS — E782 Mixed hyperlipidemia: Secondary | ICD-10-CM | POA: Diagnosis not present

## 2022-06-26 DIAGNOSIS — I10 Essential (primary) hypertension: Secondary | ICD-10-CM | POA: Diagnosis not present

## 2022-06-26 DIAGNOSIS — M6281 Muscle weakness (generalized): Secondary | ICD-10-CM | POA: Diagnosis not present

## 2022-06-26 DIAGNOSIS — I130 Hypertensive heart and chronic kidney disease with heart failure and stage 1 through stage 4 chronic kidney disease, or unspecified chronic kidney disease: Secondary | ICD-10-CM | POA: Diagnosis not present

## 2022-06-26 DIAGNOSIS — I87311 Chronic venous hypertension (idiopathic) with ulcer of right lower extremity: Secondary | ICD-10-CM | POA: Diagnosis not present

## 2022-06-29 DIAGNOSIS — M6281 Muscle weakness (generalized): Secondary | ICD-10-CM | POA: Diagnosis not present

## 2022-06-29 DIAGNOSIS — L97812 Non-pressure chronic ulcer of other part of right lower leg with fat layer exposed: Secondary | ICD-10-CM | POA: Diagnosis not present

## 2022-06-29 DIAGNOSIS — I10 Essential (primary) hypertension: Secondary | ICD-10-CM | POA: Diagnosis not present

## 2022-06-29 DIAGNOSIS — K2100 Gastro-esophageal reflux disease with esophagitis, without bleeding: Secondary | ICD-10-CM | POA: Diagnosis not present

## 2022-06-29 DIAGNOSIS — Z794 Long term (current) use of insulin: Secondary | ICD-10-CM | POA: Diagnosis not present

## 2022-06-29 DIAGNOSIS — I252 Old myocardial infarction: Secondary | ICD-10-CM | POA: Diagnosis not present

## 2022-06-29 DIAGNOSIS — I872 Venous insufficiency (chronic) (peripheral): Secondary | ICD-10-CM | POA: Diagnosis not present

## 2022-06-29 DIAGNOSIS — E785 Hyperlipidemia, unspecified: Secondary | ICD-10-CM | POA: Diagnosis not present

## 2022-06-29 DIAGNOSIS — E1122 Type 2 diabetes mellitus with diabetic chronic kidney disease: Secondary | ICD-10-CM | POA: Diagnosis not present

## 2022-06-29 DIAGNOSIS — M810 Age-related osteoporosis without current pathological fracture: Secondary | ICD-10-CM | POA: Diagnosis not present

## 2022-06-29 DIAGNOSIS — E1151 Type 2 diabetes mellitus with diabetic peripheral angiopathy without gangrene: Secondary | ICD-10-CM | POA: Diagnosis not present

## 2022-06-29 DIAGNOSIS — Z7985 Long-term (current) use of injectable non-insulin antidiabetic drugs: Secondary | ICD-10-CM | POA: Diagnosis not present

## 2022-06-29 DIAGNOSIS — L8942 Pressure ulcer of contiguous site of back, buttock and hip, stage 2: Secondary | ICD-10-CM | POA: Diagnosis not present

## 2022-06-29 DIAGNOSIS — I130 Hypertensive heart and chronic kidney disease with heart failure and stage 1 through stage 4 chronic kidney disease, or unspecified chronic kidney disease: Secondary | ICD-10-CM | POA: Diagnosis not present

## 2022-06-29 DIAGNOSIS — L89312 Pressure ulcer of right buttock, stage 2: Secondary | ICD-10-CM | POA: Diagnosis not present

## 2022-06-29 DIAGNOSIS — I890 Lymphedema, not elsewhere classified: Secondary | ICD-10-CM | POA: Diagnosis not present

## 2022-06-29 DIAGNOSIS — N1831 Chronic kidney disease, stage 3a: Secondary | ICD-10-CM | POA: Diagnosis not present

## 2022-06-29 DIAGNOSIS — N183 Chronic kidney disease, stage 3 unspecified: Secondary | ICD-10-CM | POA: Diagnosis not present

## 2022-06-29 DIAGNOSIS — E7849 Other hyperlipidemia: Secondary | ICD-10-CM | POA: Diagnosis not present

## 2022-06-29 DIAGNOSIS — I5032 Chronic diastolic (congestive) heart failure: Secondary | ICD-10-CM | POA: Diagnosis not present

## 2022-06-29 DIAGNOSIS — L03031 Cellulitis of right toe: Secondary | ICD-10-CM | POA: Diagnosis not present

## 2022-06-29 DIAGNOSIS — I87311 Chronic venous hypertension (idiopathic) with ulcer of right lower extremity: Secondary | ICD-10-CM | POA: Diagnosis not present

## 2022-06-29 DIAGNOSIS — Z7984 Long term (current) use of oral hypoglycemic drugs: Secondary | ICD-10-CM | POA: Diagnosis not present

## 2022-06-29 DIAGNOSIS — E1142 Type 2 diabetes mellitus with diabetic polyneuropathy: Secondary | ICD-10-CM | POA: Diagnosis not present

## 2022-06-29 DIAGNOSIS — Z9181 History of falling: Secondary | ICD-10-CM | POA: Diagnosis not present

## 2022-06-29 DIAGNOSIS — R5383 Other fatigue: Secondary | ICD-10-CM | POA: Diagnosis not present

## 2022-06-29 DIAGNOSIS — I255 Ischemic cardiomyopathy: Secondary | ICD-10-CM | POA: Diagnosis not present

## 2022-06-29 DIAGNOSIS — E1165 Type 2 diabetes mellitus with hyperglycemia: Secondary | ICD-10-CM | POA: Diagnosis not present

## 2022-06-29 DIAGNOSIS — L89323 Pressure ulcer of left buttock, stage 3: Secondary | ICD-10-CM | POA: Diagnosis not present

## 2022-07-01 DIAGNOSIS — N1831 Chronic kidney disease, stage 3a: Secondary | ICD-10-CM | POA: Diagnosis not present

## 2022-07-01 DIAGNOSIS — I255 Ischemic cardiomyopathy: Secondary | ICD-10-CM | POA: Diagnosis not present

## 2022-07-01 DIAGNOSIS — M810 Age-related osteoporosis without current pathological fracture: Secondary | ICD-10-CM | POA: Diagnosis not present

## 2022-07-01 DIAGNOSIS — E0822 Diabetes mellitus due to underlying condition with diabetic chronic kidney disease: Secondary | ICD-10-CM | POA: Diagnosis not present

## 2022-07-01 DIAGNOSIS — L89312 Pressure ulcer of right buttock, stage 2: Secondary | ICD-10-CM | POA: Diagnosis not present

## 2022-07-01 DIAGNOSIS — L97812 Non-pressure chronic ulcer of other part of right lower leg with fat layer exposed: Secondary | ICD-10-CM | POA: Diagnosis not present

## 2022-07-01 DIAGNOSIS — E785 Hyperlipidemia, unspecified: Secondary | ICD-10-CM | POA: Diagnosis not present

## 2022-07-01 DIAGNOSIS — R5383 Other fatigue: Secondary | ICD-10-CM | POA: Diagnosis not present

## 2022-07-01 DIAGNOSIS — I87311 Chronic venous hypertension (idiopathic) with ulcer of right lower extremity: Secondary | ICD-10-CM | POA: Diagnosis not present

## 2022-07-01 DIAGNOSIS — I252 Old myocardial infarction: Secondary | ICD-10-CM | POA: Diagnosis not present

## 2022-07-01 DIAGNOSIS — I130 Hypertensive heart and chronic kidney disease with heart failure and stage 1 through stage 4 chronic kidney disease, or unspecified chronic kidney disease: Secondary | ICD-10-CM | POA: Diagnosis not present

## 2022-07-01 DIAGNOSIS — Z7984 Long term (current) use of oral hypoglycemic drugs: Secondary | ICD-10-CM | POA: Diagnosis not present

## 2022-07-01 DIAGNOSIS — L89323 Pressure ulcer of left buttock, stage 3: Secondary | ICD-10-CM | POA: Diagnosis not present

## 2022-07-01 DIAGNOSIS — Z7985 Long-term (current) use of injectable non-insulin antidiabetic drugs: Secondary | ICD-10-CM | POA: Diagnosis not present

## 2022-07-01 DIAGNOSIS — M6281 Muscle weakness (generalized): Secondary | ICD-10-CM | POA: Diagnosis not present

## 2022-07-01 DIAGNOSIS — Z9181 History of falling: Secondary | ICD-10-CM | POA: Diagnosis not present

## 2022-07-01 DIAGNOSIS — F17210 Nicotine dependence, cigarettes, uncomplicated: Secondary | ICD-10-CM | POA: Diagnosis not present

## 2022-07-01 DIAGNOSIS — N1832 Chronic kidney disease, stage 3b: Secondary | ICD-10-CM | POA: Diagnosis not present

## 2022-07-01 DIAGNOSIS — Z794 Long term (current) use of insulin: Secondary | ICD-10-CM | POA: Diagnosis not present

## 2022-07-01 DIAGNOSIS — I5032 Chronic diastolic (congestive) heart failure: Secondary | ICD-10-CM | POA: Diagnosis not present

## 2022-07-01 DIAGNOSIS — I890 Lymphedema, not elsewhere classified: Secondary | ICD-10-CM | POA: Diagnosis not present

## 2022-07-01 DIAGNOSIS — E1165 Type 2 diabetes mellitus with hyperglycemia: Secondary | ICD-10-CM | POA: Diagnosis not present

## 2022-07-01 DIAGNOSIS — E1151 Type 2 diabetes mellitus with diabetic peripheral angiopathy without gangrene: Secondary | ICD-10-CM | POA: Diagnosis not present

## 2022-07-01 DIAGNOSIS — L8990 Pressure ulcer of unspecified site, unspecified stage: Secondary | ICD-10-CM | POA: Diagnosis not present

## 2022-07-01 DIAGNOSIS — I872 Venous insufficiency (chronic) (peripheral): Secondary | ICD-10-CM | POA: Diagnosis not present

## 2022-07-01 DIAGNOSIS — L03119 Cellulitis of unspecified part of limb: Secondary | ICD-10-CM | POA: Diagnosis not present

## 2022-07-01 DIAGNOSIS — R030 Elevated blood-pressure reading, without diagnosis of hypertension: Secondary | ICD-10-CM | POA: Diagnosis not present

## 2022-07-01 DIAGNOSIS — E1122 Type 2 diabetes mellitus with diabetic chronic kidney disease: Secondary | ICD-10-CM | POA: Diagnosis not present

## 2022-07-01 DIAGNOSIS — E1142 Type 2 diabetes mellitus with diabetic polyneuropathy: Secondary | ICD-10-CM | POA: Diagnosis not present

## 2022-07-01 DIAGNOSIS — L8942 Pressure ulcer of contiguous site of back, buttock and hip, stage 2: Secondary | ICD-10-CM | POA: Diagnosis not present

## 2022-07-01 DIAGNOSIS — R609 Edema, unspecified: Secondary | ICD-10-CM | POA: Diagnosis not present

## 2022-07-01 DIAGNOSIS — L0231 Cutaneous abscess of buttock: Secondary | ICD-10-CM | POA: Diagnosis not present

## 2022-07-01 DIAGNOSIS — L03039 Cellulitis of unspecified toe: Secondary | ICD-10-CM | POA: Diagnosis not present

## 2022-07-01 DIAGNOSIS — R0609 Other forms of dyspnea: Secondary | ICD-10-CM | POA: Diagnosis not present

## 2022-07-03 DIAGNOSIS — N1831 Chronic kidney disease, stage 3a: Secondary | ICD-10-CM | POA: Diagnosis not present

## 2022-07-03 DIAGNOSIS — E785 Hyperlipidemia, unspecified: Secondary | ICD-10-CM | POA: Diagnosis not present

## 2022-07-03 DIAGNOSIS — I87311 Chronic venous hypertension (idiopathic) with ulcer of right lower extremity: Secondary | ICD-10-CM | POA: Diagnosis not present

## 2022-07-03 DIAGNOSIS — E1151 Type 2 diabetes mellitus with diabetic peripheral angiopathy without gangrene: Secondary | ICD-10-CM | POA: Diagnosis not present

## 2022-07-03 DIAGNOSIS — Z9181 History of falling: Secondary | ICD-10-CM | POA: Diagnosis not present

## 2022-07-03 DIAGNOSIS — Z794 Long term (current) use of insulin: Secondary | ICD-10-CM | POA: Diagnosis not present

## 2022-07-03 DIAGNOSIS — M6281 Muscle weakness (generalized): Secondary | ICD-10-CM | POA: Diagnosis not present

## 2022-07-03 DIAGNOSIS — L97812 Non-pressure chronic ulcer of other part of right lower leg with fat layer exposed: Secondary | ICD-10-CM | POA: Diagnosis not present

## 2022-07-03 DIAGNOSIS — I252 Old myocardial infarction: Secondary | ICD-10-CM | POA: Diagnosis not present

## 2022-07-03 DIAGNOSIS — L89312 Pressure ulcer of right buttock, stage 2: Secondary | ICD-10-CM | POA: Diagnosis not present

## 2022-07-03 DIAGNOSIS — I872 Venous insufficiency (chronic) (peripheral): Secondary | ICD-10-CM | POA: Diagnosis not present

## 2022-07-03 DIAGNOSIS — I130 Hypertensive heart and chronic kidney disease with heart failure and stage 1 through stage 4 chronic kidney disease, or unspecified chronic kidney disease: Secondary | ICD-10-CM | POA: Diagnosis not present

## 2022-07-03 DIAGNOSIS — I5032 Chronic diastolic (congestive) heart failure: Secondary | ICD-10-CM | POA: Diagnosis not present

## 2022-07-03 DIAGNOSIS — I255 Ischemic cardiomyopathy: Secondary | ICD-10-CM | POA: Diagnosis not present

## 2022-07-03 DIAGNOSIS — I890 Lymphedema, not elsewhere classified: Secondary | ICD-10-CM | POA: Diagnosis not present

## 2022-07-03 DIAGNOSIS — E1142 Type 2 diabetes mellitus with diabetic polyneuropathy: Secondary | ICD-10-CM | POA: Diagnosis not present

## 2022-07-03 DIAGNOSIS — L8942 Pressure ulcer of contiguous site of back, buttock and hip, stage 2: Secondary | ICD-10-CM | POA: Diagnosis not present

## 2022-07-03 DIAGNOSIS — L89323 Pressure ulcer of left buttock, stage 3: Secondary | ICD-10-CM | POA: Diagnosis not present

## 2022-07-03 DIAGNOSIS — E1122 Type 2 diabetes mellitus with diabetic chronic kidney disease: Secondary | ICD-10-CM | POA: Diagnosis not present

## 2022-07-03 DIAGNOSIS — Z7985 Long-term (current) use of injectable non-insulin antidiabetic drugs: Secondary | ICD-10-CM | POA: Diagnosis not present

## 2022-07-03 DIAGNOSIS — Z7984 Long term (current) use of oral hypoglycemic drugs: Secondary | ICD-10-CM | POA: Diagnosis not present

## 2022-07-03 DIAGNOSIS — M810 Age-related osteoporosis without current pathological fracture: Secondary | ICD-10-CM | POA: Diagnosis not present

## 2022-07-06 DIAGNOSIS — I5032 Chronic diastolic (congestive) heart failure: Secondary | ICD-10-CM | POA: Diagnosis not present

## 2022-07-06 DIAGNOSIS — L97812 Non-pressure chronic ulcer of other part of right lower leg with fat layer exposed: Secondary | ICD-10-CM | POA: Diagnosis not present

## 2022-07-06 DIAGNOSIS — I255 Ischemic cardiomyopathy: Secondary | ICD-10-CM | POA: Diagnosis not present

## 2022-07-06 DIAGNOSIS — I890 Lymphedema, not elsewhere classified: Secondary | ICD-10-CM | POA: Diagnosis not present

## 2022-07-06 DIAGNOSIS — E785 Hyperlipidemia, unspecified: Secondary | ICD-10-CM | POA: Diagnosis not present

## 2022-07-06 DIAGNOSIS — E1142 Type 2 diabetes mellitus with diabetic polyneuropathy: Secondary | ICD-10-CM | POA: Diagnosis not present

## 2022-07-06 DIAGNOSIS — L89312 Pressure ulcer of right buttock, stage 2: Secondary | ICD-10-CM | POA: Diagnosis not present

## 2022-07-06 DIAGNOSIS — L8942 Pressure ulcer of contiguous site of back, buttock and hip, stage 2: Secondary | ICD-10-CM | POA: Diagnosis not present

## 2022-07-06 DIAGNOSIS — E1122 Type 2 diabetes mellitus with diabetic chronic kidney disease: Secondary | ICD-10-CM | POA: Diagnosis not present

## 2022-07-06 DIAGNOSIS — Z7985 Long-term (current) use of injectable non-insulin antidiabetic drugs: Secondary | ICD-10-CM | POA: Diagnosis not present

## 2022-07-06 DIAGNOSIS — I130 Hypertensive heart and chronic kidney disease with heart failure and stage 1 through stage 4 chronic kidney disease, or unspecified chronic kidney disease: Secondary | ICD-10-CM | POA: Diagnosis not present

## 2022-07-06 DIAGNOSIS — M6281 Muscle weakness (generalized): Secondary | ICD-10-CM | POA: Diagnosis not present

## 2022-07-06 DIAGNOSIS — N1831 Chronic kidney disease, stage 3a: Secondary | ICD-10-CM | POA: Diagnosis not present

## 2022-07-06 DIAGNOSIS — Z7984 Long term (current) use of oral hypoglycemic drugs: Secondary | ICD-10-CM | POA: Diagnosis not present

## 2022-07-06 DIAGNOSIS — Z794 Long term (current) use of insulin: Secondary | ICD-10-CM | POA: Diagnosis not present

## 2022-07-06 DIAGNOSIS — E1151 Type 2 diabetes mellitus with diabetic peripheral angiopathy without gangrene: Secondary | ICD-10-CM | POA: Diagnosis not present

## 2022-07-06 DIAGNOSIS — I252 Old myocardial infarction: Secondary | ICD-10-CM | POA: Diagnosis not present

## 2022-07-06 DIAGNOSIS — Z9181 History of falling: Secondary | ICD-10-CM | POA: Diagnosis not present

## 2022-07-06 DIAGNOSIS — I872 Venous insufficiency (chronic) (peripheral): Secondary | ICD-10-CM | POA: Diagnosis not present

## 2022-07-06 DIAGNOSIS — I87311 Chronic venous hypertension (idiopathic) with ulcer of right lower extremity: Secondary | ICD-10-CM | POA: Diagnosis not present

## 2022-07-06 DIAGNOSIS — L89323 Pressure ulcer of left buttock, stage 3: Secondary | ICD-10-CM | POA: Diagnosis not present

## 2022-07-06 DIAGNOSIS — M810 Age-related osteoporosis without current pathological fracture: Secondary | ICD-10-CM | POA: Diagnosis not present

## 2022-07-08 DIAGNOSIS — I872 Venous insufficiency (chronic) (peripheral): Secondary | ICD-10-CM | POA: Diagnosis not present

## 2022-07-08 DIAGNOSIS — M810 Age-related osteoporosis without current pathological fracture: Secondary | ICD-10-CM | POA: Diagnosis not present

## 2022-07-08 DIAGNOSIS — Z7984 Long term (current) use of oral hypoglycemic drugs: Secondary | ICD-10-CM | POA: Diagnosis not present

## 2022-07-08 DIAGNOSIS — I5032 Chronic diastolic (congestive) heart failure: Secondary | ICD-10-CM | POA: Diagnosis not present

## 2022-07-08 DIAGNOSIS — E1151 Type 2 diabetes mellitus with diabetic peripheral angiopathy without gangrene: Secondary | ICD-10-CM | POA: Diagnosis not present

## 2022-07-08 DIAGNOSIS — N1831 Chronic kidney disease, stage 3a: Secondary | ICD-10-CM | POA: Diagnosis not present

## 2022-07-08 DIAGNOSIS — L89151 Pressure ulcer of sacral region, stage 1: Secondary | ICD-10-CM | POA: Diagnosis not present

## 2022-07-08 DIAGNOSIS — E785 Hyperlipidemia, unspecified: Secondary | ICD-10-CM | POA: Diagnosis not present

## 2022-07-08 DIAGNOSIS — I890 Lymphedema, not elsewhere classified: Secondary | ICD-10-CM | POA: Diagnosis not present

## 2022-07-08 DIAGNOSIS — I252 Old myocardial infarction: Secondary | ICD-10-CM | POA: Diagnosis not present

## 2022-07-08 DIAGNOSIS — I255 Ischemic cardiomyopathy: Secondary | ICD-10-CM | POA: Diagnosis not present

## 2022-07-08 DIAGNOSIS — L89322 Pressure ulcer of left buttock, stage 2: Secondary | ICD-10-CM | POA: Diagnosis not present

## 2022-07-08 DIAGNOSIS — E1142 Type 2 diabetes mellitus with diabetic polyneuropathy: Secondary | ICD-10-CM | POA: Diagnosis not present

## 2022-07-08 DIAGNOSIS — Z9181 History of falling: Secondary | ICD-10-CM | POA: Diagnosis not present

## 2022-07-08 DIAGNOSIS — I87311 Chronic venous hypertension (idiopathic) with ulcer of right lower extremity: Secondary | ICD-10-CM | POA: Diagnosis not present

## 2022-07-08 DIAGNOSIS — L8942 Pressure ulcer of contiguous site of back, buttock and hip, stage 2: Secondary | ICD-10-CM | POA: Diagnosis not present

## 2022-07-08 DIAGNOSIS — Z7985 Long-term (current) use of injectable non-insulin antidiabetic drugs: Secondary | ICD-10-CM | POA: Diagnosis not present

## 2022-07-08 DIAGNOSIS — L97812 Non-pressure chronic ulcer of other part of right lower leg with fat layer exposed: Secondary | ICD-10-CM | POA: Diagnosis not present

## 2022-07-08 DIAGNOSIS — Z794 Long term (current) use of insulin: Secondary | ICD-10-CM | POA: Diagnosis not present

## 2022-07-08 DIAGNOSIS — I130 Hypertensive heart and chronic kidney disease with heart failure and stage 1 through stage 4 chronic kidney disease, or unspecified chronic kidney disease: Secondary | ICD-10-CM | POA: Diagnosis not present

## 2022-07-08 DIAGNOSIS — L89312 Pressure ulcer of right buttock, stage 2: Secondary | ICD-10-CM | POA: Diagnosis not present

## 2022-07-08 DIAGNOSIS — E1122 Type 2 diabetes mellitus with diabetic chronic kidney disease: Secondary | ICD-10-CM | POA: Diagnosis not present

## 2022-07-08 DIAGNOSIS — M6281 Muscle weakness (generalized): Secondary | ICD-10-CM | POA: Diagnosis not present

## 2022-07-08 DIAGNOSIS — L89323 Pressure ulcer of left buttock, stage 3: Secondary | ICD-10-CM | POA: Diagnosis not present

## 2022-07-11 DIAGNOSIS — L97812 Non-pressure chronic ulcer of other part of right lower leg with fat layer exposed: Secondary | ICD-10-CM | POA: Diagnosis not present

## 2022-07-11 DIAGNOSIS — N1831 Chronic kidney disease, stage 3a: Secondary | ICD-10-CM | POA: Diagnosis not present

## 2022-07-11 DIAGNOSIS — E1122 Type 2 diabetes mellitus with diabetic chronic kidney disease: Secondary | ICD-10-CM | POA: Diagnosis not present

## 2022-07-11 DIAGNOSIS — Z7985 Long-term (current) use of injectable non-insulin antidiabetic drugs: Secondary | ICD-10-CM | POA: Diagnosis not present

## 2022-07-11 DIAGNOSIS — I252 Old myocardial infarction: Secondary | ICD-10-CM | POA: Diagnosis not present

## 2022-07-11 DIAGNOSIS — Z9181 History of falling: Secondary | ICD-10-CM | POA: Diagnosis not present

## 2022-07-11 DIAGNOSIS — M6281 Muscle weakness (generalized): Secondary | ICD-10-CM | POA: Diagnosis not present

## 2022-07-11 DIAGNOSIS — I255 Ischemic cardiomyopathy: Secondary | ICD-10-CM | POA: Diagnosis not present

## 2022-07-11 DIAGNOSIS — Z4800 Encounter for change or removal of nonsurgical wound dressing: Secondary | ICD-10-CM | POA: Diagnosis not present

## 2022-07-11 DIAGNOSIS — I87311 Chronic venous hypertension (idiopathic) with ulcer of right lower extremity: Secondary | ICD-10-CM | POA: Diagnosis not present

## 2022-07-11 DIAGNOSIS — I5032 Chronic diastolic (congestive) heart failure: Secondary | ICD-10-CM | POA: Diagnosis not present

## 2022-07-11 DIAGNOSIS — Z794 Long term (current) use of insulin: Secondary | ICD-10-CM | POA: Diagnosis not present

## 2022-07-11 DIAGNOSIS — E785 Hyperlipidemia, unspecified: Secondary | ICD-10-CM | POA: Diagnosis not present

## 2022-07-11 DIAGNOSIS — I890 Lymphedema, not elsewhere classified: Secondary | ICD-10-CM | POA: Diagnosis not present

## 2022-07-11 DIAGNOSIS — L97512 Non-pressure chronic ulcer of other part of right foot with fat layer exposed: Secondary | ICD-10-CM | POA: Diagnosis not present

## 2022-07-11 DIAGNOSIS — M810 Age-related osteoporosis without current pathological fracture: Secondary | ICD-10-CM | POA: Diagnosis not present

## 2022-07-11 DIAGNOSIS — I130 Hypertensive heart and chronic kidney disease with heart failure and stage 1 through stage 4 chronic kidney disease, or unspecified chronic kidney disease: Secondary | ICD-10-CM | POA: Diagnosis not present

## 2022-07-11 DIAGNOSIS — E1151 Type 2 diabetes mellitus with diabetic peripheral angiopathy without gangrene: Secondary | ICD-10-CM | POA: Diagnosis not present

## 2022-07-11 DIAGNOSIS — E1142 Type 2 diabetes mellitus with diabetic polyneuropathy: Secondary | ICD-10-CM | POA: Diagnosis not present

## 2022-07-11 DIAGNOSIS — Z7984 Long term (current) use of oral hypoglycemic drugs: Secondary | ICD-10-CM | POA: Diagnosis not present

## 2022-07-13 DIAGNOSIS — E785 Hyperlipidemia, unspecified: Secondary | ICD-10-CM | POA: Diagnosis not present

## 2022-07-13 DIAGNOSIS — E1122 Type 2 diabetes mellitus with diabetic chronic kidney disease: Secondary | ICD-10-CM | POA: Diagnosis not present

## 2022-07-13 DIAGNOSIS — I890 Lymphedema, not elsewhere classified: Secondary | ICD-10-CM | POA: Diagnosis not present

## 2022-07-13 DIAGNOSIS — I130 Hypertensive heart and chronic kidney disease with heart failure and stage 1 through stage 4 chronic kidney disease, or unspecified chronic kidney disease: Secondary | ICD-10-CM | POA: Diagnosis not present

## 2022-07-13 DIAGNOSIS — I252 Old myocardial infarction: Secondary | ICD-10-CM | POA: Diagnosis not present

## 2022-07-13 DIAGNOSIS — Z7985 Long-term (current) use of injectable non-insulin antidiabetic drugs: Secondary | ICD-10-CM | POA: Diagnosis not present

## 2022-07-13 DIAGNOSIS — Z7984 Long term (current) use of oral hypoglycemic drugs: Secondary | ICD-10-CM | POA: Diagnosis not present

## 2022-07-13 DIAGNOSIS — I87311 Chronic venous hypertension (idiopathic) with ulcer of right lower extremity: Secondary | ICD-10-CM | POA: Diagnosis not present

## 2022-07-13 DIAGNOSIS — M810 Age-related osteoporosis without current pathological fracture: Secondary | ICD-10-CM | POA: Diagnosis not present

## 2022-07-13 DIAGNOSIS — Z4800 Encounter for change or removal of nonsurgical wound dressing: Secondary | ICD-10-CM | POA: Diagnosis not present

## 2022-07-13 DIAGNOSIS — Z9181 History of falling: Secondary | ICD-10-CM | POA: Diagnosis not present

## 2022-07-13 DIAGNOSIS — E1151 Type 2 diabetes mellitus with diabetic peripheral angiopathy without gangrene: Secondary | ICD-10-CM | POA: Diagnosis not present

## 2022-07-13 DIAGNOSIS — Z794 Long term (current) use of insulin: Secondary | ICD-10-CM | POA: Diagnosis not present

## 2022-07-13 DIAGNOSIS — M6281 Muscle weakness (generalized): Secondary | ICD-10-CM | POA: Diagnosis not present

## 2022-07-13 DIAGNOSIS — N1831 Chronic kidney disease, stage 3a: Secondary | ICD-10-CM | POA: Diagnosis not present

## 2022-07-13 DIAGNOSIS — L97512 Non-pressure chronic ulcer of other part of right foot with fat layer exposed: Secondary | ICD-10-CM | POA: Diagnosis not present

## 2022-07-13 DIAGNOSIS — I5032 Chronic diastolic (congestive) heart failure: Secondary | ICD-10-CM | POA: Diagnosis not present

## 2022-07-13 DIAGNOSIS — L97812 Non-pressure chronic ulcer of other part of right lower leg with fat layer exposed: Secondary | ICD-10-CM | POA: Diagnosis not present

## 2022-07-13 DIAGNOSIS — I255 Ischemic cardiomyopathy: Secondary | ICD-10-CM | POA: Diagnosis not present

## 2022-07-13 DIAGNOSIS — E1142 Type 2 diabetes mellitus with diabetic polyneuropathy: Secondary | ICD-10-CM | POA: Diagnosis not present

## 2022-07-14 DIAGNOSIS — L97509 Non-pressure chronic ulcer of other part of unspecified foot with unspecified severity: Secondary | ICD-10-CM | POA: Diagnosis not present

## 2022-07-14 DIAGNOSIS — R030 Elevated blood-pressure reading, without diagnosis of hypertension: Secondary | ICD-10-CM | POA: Diagnosis not present

## 2022-07-14 DIAGNOSIS — L0390 Cellulitis, unspecified: Secondary | ICD-10-CM | POA: Diagnosis not present

## 2022-07-15 DIAGNOSIS — Z7985 Long-term (current) use of injectable non-insulin antidiabetic drugs: Secondary | ICD-10-CM | POA: Diagnosis not present

## 2022-07-15 DIAGNOSIS — E1122 Type 2 diabetes mellitus with diabetic chronic kidney disease: Secondary | ICD-10-CM | POA: Diagnosis not present

## 2022-07-15 DIAGNOSIS — E1151 Type 2 diabetes mellitus with diabetic peripheral angiopathy without gangrene: Secondary | ICD-10-CM | POA: Diagnosis not present

## 2022-07-15 DIAGNOSIS — Z794 Long term (current) use of insulin: Secondary | ICD-10-CM | POA: Diagnosis not present

## 2022-07-15 DIAGNOSIS — Z7984 Long term (current) use of oral hypoglycemic drugs: Secondary | ICD-10-CM | POA: Diagnosis not present

## 2022-07-15 DIAGNOSIS — Z9181 History of falling: Secondary | ICD-10-CM | POA: Diagnosis not present

## 2022-07-15 DIAGNOSIS — Z4800 Encounter for change or removal of nonsurgical wound dressing: Secondary | ICD-10-CM | POA: Diagnosis not present

## 2022-07-15 DIAGNOSIS — I890 Lymphedema, not elsewhere classified: Secondary | ICD-10-CM | POA: Diagnosis not present

## 2022-07-15 DIAGNOSIS — I252 Old myocardial infarction: Secondary | ICD-10-CM | POA: Diagnosis not present

## 2022-07-15 DIAGNOSIS — N1831 Chronic kidney disease, stage 3a: Secondary | ICD-10-CM | POA: Diagnosis not present

## 2022-07-15 DIAGNOSIS — I130 Hypertensive heart and chronic kidney disease with heart failure and stage 1 through stage 4 chronic kidney disease, or unspecified chronic kidney disease: Secondary | ICD-10-CM | POA: Diagnosis not present

## 2022-07-15 DIAGNOSIS — L97512 Non-pressure chronic ulcer of other part of right foot with fat layer exposed: Secondary | ICD-10-CM | POA: Diagnosis not present

## 2022-07-15 DIAGNOSIS — E785 Hyperlipidemia, unspecified: Secondary | ICD-10-CM | POA: Diagnosis not present

## 2022-07-15 DIAGNOSIS — I87311 Chronic venous hypertension (idiopathic) with ulcer of right lower extremity: Secondary | ICD-10-CM | POA: Diagnosis not present

## 2022-07-15 DIAGNOSIS — L97812 Non-pressure chronic ulcer of other part of right lower leg with fat layer exposed: Secondary | ICD-10-CM | POA: Diagnosis not present

## 2022-07-15 DIAGNOSIS — I255 Ischemic cardiomyopathy: Secondary | ICD-10-CM | POA: Diagnosis not present

## 2022-07-15 DIAGNOSIS — E1142 Type 2 diabetes mellitus with diabetic polyneuropathy: Secondary | ICD-10-CM | POA: Diagnosis not present

## 2022-07-15 DIAGNOSIS — M6281 Muscle weakness (generalized): Secondary | ICD-10-CM | POA: Diagnosis not present

## 2022-07-15 DIAGNOSIS — I5032 Chronic diastolic (congestive) heart failure: Secondary | ICD-10-CM | POA: Diagnosis not present

## 2022-07-15 DIAGNOSIS — M810 Age-related osteoporosis without current pathological fracture: Secondary | ICD-10-CM | POA: Diagnosis not present

## 2022-07-17 DIAGNOSIS — Z794 Long term (current) use of insulin: Secondary | ICD-10-CM | POA: Diagnosis not present

## 2022-07-17 DIAGNOSIS — M810 Age-related osteoporosis without current pathological fracture: Secondary | ICD-10-CM | POA: Diagnosis not present

## 2022-07-17 DIAGNOSIS — I252 Old myocardial infarction: Secondary | ICD-10-CM | POA: Diagnosis not present

## 2022-07-17 DIAGNOSIS — I130 Hypertensive heart and chronic kidney disease with heart failure and stage 1 through stage 4 chronic kidney disease, or unspecified chronic kidney disease: Secondary | ICD-10-CM | POA: Diagnosis not present

## 2022-07-17 DIAGNOSIS — L97812 Non-pressure chronic ulcer of other part of right lower leg with fat layer exposed: Secondary | ICD-10-CM | POA: Diagnosis not present

## 2022-07-17 DIAGNOSIS — I5032 Chronic diastolic (congestive) heart failure: Secondary | ICD-10-CM | POA: Diagnosis not present

## 2022-07-17 DIAGNOSIS — L97512 Non-pressure chronic ulcer of other part of right foot with fat layer exposed: Secondary | ICD-10-CM | POA: Diagnosis not present

## 2022-07-17 DIAGNOSIS — Z7985 Long-term (current) use of injectable non-insulin antidiabetic drugs: Secondary | ICD-10-CM | POA: Diagnosis not present

## 2022-07-17 DIAGNOSIS — I87311 Chronic venous hypertension (idiopathic) with ulcer of right lower extremity: Secondary | ICD-10-CM | POA: Diagnosis not present

## 2022-07-17 DIAGNOSIS — N1831 Chronic kidney disease, stage 3a: Secondary | ICD-10-CM | POA: Diagnosis not present

## 2022-07-17 DIAGNOSIS — M6281 Muscle weakness (generalized): Secondary | ICD-10-CM | POA: Diagnosis not present

## 2022-07-17 DIAGNOSIS — E1142 Type 2 diabetes mellitus with diabetic polyneuropathy: Secondary | ICD-10-CM | POA: Diagnosis not present

## 2022-07-17 DIAGNOSIS — Z4800 Encounter for change or removal of nonsurgical wound dressing: Secondary | ICD-10-CM | POA: Diagnosis not present

## 2022-07-17 DIAGNOSIS — Z7984 Long term (current) use of oral hypoglycemic drugs: Secondary | ICD-10-CM | POA: Diagnosis not present

## 2022-07-17 DIAGNOSIS — E1151 Type 2 diabetes mellitus with diabetic peripheral angiopathy without gangrene: Secondary | ICD-10-CM | POA: Diagnosis not present

## 2022-07-17 DIAGNOSIS — I890 Lymphedema, not elsewhere classified: Secondary | ICD-10-CM | POA: Diagnosis not present

## 2022-07-17 DIAGNOSIS — E785 Hyperlipidemia, unspecified: Secondary | ICD-10-CM | POA: Diagnosis not present

## 2022-07-17 DIAGNOSIS — I255 Ischemic cardiomyopathy: Secondary | ICD-10-CM | POA: Diagnosis not present

## 2022-07-17 DIAGNOSIS — E1122 Type 2 diabetes mellitus with diabetic chronic kidney disease: Secondary | ICD-10-CM | POA: Diagnosis not present

## 2022-07-17 DIAGNOSIS — Z9181 History of falling: Secondary | ICD-10-CM | POA: Diagnosis not present

## 2022-07-18 DIAGNOSIS — M6281 Muscle weakness (generalized): Secondary | ICD-10-CM | POA: Diagnosis not present

## 2022-07-18 DIAGNOSIS — L89323 Pressure ulcer of left buttock, stage 3: Secondary | ICD-10-CM | POA: Diagnosis not present

## 2022-07-20 DIAGNOSIS — Z794 Long term (current) use of insulin: Secondary | ICD-10-CM | POA: Diagnosis not present

## 2022-07-20 DIAGNOSIS — M810 Age-related osteoporosis without current pathological fracture: Secondary | ICD-10-CM | POA: Diagnosis not present

## 2022-07-20 DIAGNOSIS — E1151 Type 2 diabetes mellitus with diabetic peripheral angiopathy without gangrene: Secondary | ICD-10-CM | POA: Diagnosis not present

## 2022-07-20 DIAGNOSIS — Z4800 Encounter for change or removal of nonsurgical wound dressing: Secondary | ICD-10-CM | POA: Diagnosis not present

## 2022-07-20 DIAGNOSIS — I130 Hypertensive heart and chronic kidney disease with heart failure and stage 1 through stage 4 chronic kidney disease, or unspecified chronic kidney disease: Secondary | ICD-10-CM | POA: Diagnosis not present

## 2022-07-20 DIAGNOSIS — I252 Old myocardial infarction: Secondary | ICD-10-CM | POA: Diagnosis not present

## 2022-07-20 DIAGNOSIS — I890 Lymphedema, not elsewhere classified: Secondary | ICD-10-CM | POA: Diagnosis not present

## 2022-07-20 DIAGNOSIS — L89151 Pressure ulcer of sacral region, stage 1: Secondary | ICD-10-CM | POA: Diagnosis not present

## 2022-07-20 DIAGNOSIS — M6281 Muscle weakness (generalized): Secondary | ICD-10-CM | POA: Diagnosis not present

## 2022-07-20 DIAGNOSIS — E785 Hyperlipidemia, unspecified: Secondary | ICD-10-CM | POA: Diagnosis not present

## 2022-07-20 DIAGNOSIS — I255 Ischemic cardiomyopathy: Secondary | ICD-10-CM | POA: Diagnosis not present

## 2022-07-20 DIAGNOSIS — I87311 Chronic venous hypertension (idiopathic) with ulcer of right lower extremity: Secondary | ICD-10-CM | POA: Diagnosis not present

## 2022-07-20 DIAGNOSIS — N1831 Chronic kidney disease, stage 3a: Secondary | ICD-10-CM | POA: Diagnosis not present

## 2022-07-20 DIAGNOSIS — Z9181 History of falling: Secondary | ICD-10-CM | POA: Diagnosis not present

## 2022-07-20 DIAGNOSIS — Z7985 Long-term (current) use of injectable non-insulin antidiabetic drugs: Secondary | ICD-10-CM | POA: Diagnosis not present

## 2022-07-20 DIAGNOSIS — L97812 Non-pressure chronic ulcer of other part of right lower leg with fat layer exposed: Secondary | ICD-10-CM | POA: Diagnosis not present

## 2022-07-20 DIAGNOSIS — I5032 Chronic diastolic (congestive) heart failure: Secondary | ICD-10-CM | POA: Diagnosis not present

## 2022-07-20 DIAGNOSIS — L89322 Pressure ulcer of left buttock, stage 2: Secondary | ICD-10-CM | POA: Diagnosis not present

## 2022-07-20 DIAGNOSIS — E1142 Type 2 diabetes mellitus with diabetic polyneuropathy: Secondary | ICD-10-CM | POA: Diagnosis not present

## 2022-07-20 DIAGNOSIS — Z7984 Long term (current) use of oral hypoglycemic drugs: Secondary | ICD-10-CM | POA: Diagnosis not present

## 2022-07-20 DIAGNOSIS — L97512 Non-pressure chronic ulcer of other part of right foot with fat layer exposed: Secondary | ICD-10-CM | POA: Diagnosis not present

## 2022-07-20 DIAGNOSIS — E1122 Type 2 diabetes mellitus with diabetic chronic kidney disease: Secondary | ICD-10-CM | POA: Diagnosis not present

## 2022-07-22 DIAGNOSIS — L97512 Non-pressure chronic ulcer of other part of right foot with fat layer exposed: Secondary | ICD-10-CM | POA: Diagnosis not present

## 2022-07-22 DIAGNOSIS — Z794 Long term (current) use of insulin: Secondary | ICD-10-CM | POA: Diagnosis not present

## 2022-07-22 DIAGNOSIS — E1142 Type 2 diabetes mellitus with diabetic polyneuropathy: Secondary | ICD-10-CM | POA: Diagnosis not present

## 2022-07-22 DIAGNOSIS — E1151 Type 2 diabetes mellitus with diabetic peripheral angiopathy without gangrene: Secondary | ICD-10-CM | POA: Diagnosis not present

## 2022-07-22 DIAGNOSIS — I5032 Chronic diastolic (congestive) heart failure: Secondary | ICD-10-CM | POA: Diagnosis not present

## 2022-07-22 DIAGNOSIS — Z7984 Long term (current) use of oral hypoglycemic drugs: Secondary | ICD-10-CM | POA: Diagnosis not present

## 2022-07-22 DIAGNOSIS — M6281 Muscle weakness (generalized): Secondary | ICD-10-CM | POA: Diagnosis not present

## 2022-07-22 DIAGNOSIS — E785 Hyperlipidemia, unspecified: Secondary | ICD-10-CM | POA: Diagnosis not present

## 2022-07-22 DIAGNOSIS — E1122 Type 2 diabetes mellitus with diabetic chronic kidney disease: Secondary | ICD-10-CM | POA: Diagnosis not present

## 2022-07-22 DIAGNOSIS — I87311 Chronic venous hypertension (idiopathic) with ulcer of right lower extremity: Secondary | ICD-10-CM | POA: Diagnosis not present

## 2022-07-22 DIAGNOSIS — I890 Lymphedema, not elsewhere classified: Secondary | ICD-10-CM | POA: Diagnosis not present

## 2022-07-22 DIAGNOSIS — M810 Age-related osteoporosis without current pathological fracture: Secondary | ICD-10-CM | POA: Diagnosis not present

## 2022-07-22 DIAGNOSIS — L97812 Non-pressure chronic ulcer of other part of right lower leg with fat layer exposed: Secondary | ICD-10-CM | POA: Diagnosis not present

## 2022-07-22 DIAGNOSIS — I130 Hypertensive heart and chronic kidney disease with heart failure and stage 1 through stage 4 chronic kidney disease, or unspecified chronic kidney disease: Secondary | ICD-10-CM | POA: Diagnosis not present

## 2022-07-22 DIAGNOSIS — N1831 Chronic kidney disease, stage 3a: Secondary | ICD-10-CM | POA: Diagnosis not present

## 2022-07-22 DIAGNOSIS — Z7985 Long-term (current) use of injectable non-insulin antidiabetic drugs: Secondary | ICD-10-CM | POA: Diagnosis not present

## 2022-07-22 DIAGNOSIS — I255 Ischemic cardiomyopathy: Secondary | ICD-10-CM | POA: Diagnosis not present

## 2022-07-22 DIAGNOSIS — Z4800 Encounter for change or removal of nonsurgical wound dressing: Secondary | ICD-10-CM | POA: Diagnosis not present

## 2022-07-22 DIAGNOSIS — I252 Old myocardial infarction: Secondary | ICD-10-CM | POA: Diagnosis not present

## 2022-07-22 DIAGNOSIS — Z9181 History of falling: Secondary | ICD-10-CM | POA: Diagnosis not present

## 2022-07-26 DIAGNOSIS — E782 Mixed hyperlipidemia: Secondary | ICD-10-CM | POA: Diagnosis not present

## 2022-07-26 DIAGNOSIS — N183 Chronic kidney disease, stage 3 unspecified: Secondary | ICD-10-CM | POA: Diagnosis not present

## 2022-07-26 DIAGNOSIS — I10 Essential (primary) hypertension: Secondary | ICD-10-CM | POA: Diagnosis not present

## 2022-07-26 DIAGNOSIS — E1165 Type 2 diabetes mellitus with hyperglycemia: Secondary | ICD-10-CM | POA: Diagnosis not present

## 2022-07-27 DIAGNOSIS — Z7985 Long-term (current) use of injectable non-insulin antidiabetic drugs: Secondary | ICD-10-CM | POA: Diagnosis not present

## 2022-07-27 DIAGNOSIS — E1142 Type 2 diabetes mellitus with diabetic polyneuropathy: Secondary | ICD-10-CM | POA: Diagnosis not present

## 2022-07-27 DIAGNOSIS — Z7984 Long term (current) use of oral hypoglycemic drugs: Secondary | ICD-10-CM | POA: Diagnosis not present

## 2022-07-27 DIAGNOSIS — I130 Hypertensive heart and chronic kidney disease with heart failure and stage 1 through stage 4 chronic kidney disease, or unspecified chronic kidney disease: Secondary | ICD-10-CM | POA: Diagnosis not present

## 2022-07-27 DIAGNOSIS — M810 Age-related osteoporosis without current pathological fracture: Secondary | ICD-10-CM | POA: Diagnosis not present

## 2022-07-27 DIAGNOSIS — I87311 Chronic venous hypertension (idiopathic) with ulcer of right lower extremity: Secondary | ICD-10-CM | POA: Diagnosis not present

## 2022-07-27 DIAGNOSIS — I255 Ischemic cardiomyopathy: Secondary | ICD-10-CM | POA: Diagnosis not present

## 2022-07-27 DIAGNOSIS — I890 Lymphedema, not elsewhere classified: Secondary | ICD-10-CM | POA: Diagnosis not present

## 2022-07-27 DIAGNOSIS — M6281 Muscle weakness (generalized): Secondary | ICD-10-CM | POA: Diagnosis not present

## 2022-07-27 DIAGNOSIS — I252 Old myocardial infarction: Secondary | ICD-10-CM | POA: Diagnosis not present

## 2022-07-27 DIAGNOSIS — E785 Hyperlipidemia, unspecified: Secondary | ICD-10-CM | POA: Diagnosis not present

## 2022-07-27 DIAGNOSIS — Z9181 History of falling: Secondary | ICD-10-CM | POA: Diagnosis not present

## 2022-07-27 DIAGNOSIS — Z4800 Encounter for change or removal of nonsurgical wound dressing: Secondary | ICD-10-CM | POA: Diagnosis not present

## 2022-07-27 DIAGNOSIS — E1151 Type 2 diabetes mellitus with diabetic peripheral angiopathy without gangrene: Secondary | ICD-10-CM | POA: Diagnosis not present

## 2022-07-27 DIAGNOSIS — Z794 Long term (current) use of insulin: Secondary | ICD-10-CM | POA: Diagnosis not present

## 2022-07-27 DIAGNOSIS — I5032 Chronic diastolic (congestive) heart failure: Secondary | ICD-10-CM | POA: Diagnosis not present

## 2022-07-27 DIAGNOSIS — N1831 Chronic kidney disease, stage 3a: Secondary | ICD-10-CM | POA: Diagnosis not present

## 2022-07-27 DIAGNOSIS — L97512 Non-pressure chronic ulcer of other part of right foot with fat layer exposed: Secondary | ICD-10-CM | POA: Diagnosis not present

## 2022-07-27 DIAGNOSIS — L97812 Non-pressure chronic ulcer of other part of right lower leg with fat layer exposed: Secondary | ICD-10-CM | POA: Diagnosis not present

## 2022-07-27 DIAGNOSIS — E1122 Type 2 diabetes mellitus with diabetic chronic kidney disease: Secondary | ICD-10-CM | POA: Diagnosis not present

## 2022-07-29 DIAGNOSIS — E11621 Type 2 diabetes mellitus with foot ulcer: Secondary | ICD-10-CM | POA: Diagnosis not present

## 2022-07-29 DIAGNOSIS — I890 Lymphedema, not elsewhere classified: Secondary | ICD-10-CM | POA: Diagnosis not present

## 2022-07-29 DIAGNOSIS — E1151 Type 2 diabetes mellitus with diabetic peripheral angiopathy without gangrene: Secondary | ICD-10-CM | POA: Diagnosis not present

## 2022-07-29 DIAGNOSIS — I2510 Atherosclerotic heart disease of native coronary artery without angina pectoris: Secondary | ICD-10-CM | POA: Diagnosis not present

## 2022-07-29 DIAGNOSIS — L97516 Non-pressure chronic ulcer of other part of right foot with bone involvement without evidence of necrosis: Secondary | ICD-10-CM | POA: Diagnosis not present

## 2022-07-29 DIAGNOSIS — I509 Heart failure, unspecified: Secondary | ICD-10-CM | POA: Diagnosis not present

## 2022-07-29 DIAGNOSIS — Z7984 Long term (current) use of oral hypoglycemic drugs: Secondary | ICD-10-CM | POA: Diagnosis not present

## 2022-07-29 DIAGNOSIS — Z87891 Personal history of nicotine dependence: Secondary | ICD-10-CM | POA: Diagnosis not present

## 2022-07-29 DIAGNOSIS — E785 Hyperlipidemia, unspecified: Secondary | ICD-10-CM | POA: Diagnosis not present

## 2022-07-29 DIAGNOSIS — I96 Gangrene, not elsewhere classified: Secondary | ICD-10-CM | POA: Diagnosis not present

## 2022-07-29 DIAGNOSIS — E1140 Type 2 diabetes mellitus with diabetic neuropathy, unspecified: Secondary | ICD-10-CM | POA: Diagnosis not present

## 2022-07-29 DIAGNOSIS — Z89421 Acquired absence of other right toe(s): Secondary | ICD-10-CM | POA: Diagnosis not present

## 2022-07-29 DIAGNOSIS — Z22322 Carrier or suspected carrier of Methicillin resistant Staphylococcus aureus: Secondary | ICD-10-CM | POA: Diagnosis not present

## 2022-07-29 DIAGNOSIS — A498 Other bacterial infections of unspecified site: Secondary | ICD-10-CM | POA: Diagnosis not present

## 2022-07-29 DIAGNOSIS — Z794 Long term (current) use of insulin: Secondary | ICD-10-CM | POA: Diagnosis not present

## 2022-07-29 DIAGNOSIS — I110 Hypertensive heart disease with heart failure: Secondary | ICD-10-CM | POA: Diagnosis not present

## 2022-07-29 DIAGNOSIS — M869 Osteomyelitis, unspecified: Secondary | ICD-10-CM | POA: Diagnosis not present

## 2022-07-29 DIAGNOSIS — Z7722 Contact with and (suspected) exposure to environmental tobacco smoke (acute) (chronic): Secondary | ICD-10-CM | POA: Diagnosis not present

## 2022-07-29 DIAGNOSIS — E1169 Type 2 diabetes mellitus with other specified complication: Secondary | ICD-10-CM | POA: Diagnosis not present

## 2022-07-29 DIAGNOSIS — Z7985 Long-term (current) use of injectable non-insulin antidiabetic drugs: Secondary | ICD-10-CM | POA: Diagnosis not present

## 2022-07-29 DIAGNOSIS — L97529 Non-pressure chronic ulcer of other part of left foot with unspecified severity: Secondary | ICD-10-CM | POA: Diagnosis not present

## 2022-07-31 DIAGNOSIS — E1122 Type 2 diabetes mellitus with diabetic chronic kidney disease: Secondary | ICD-10-CM | POA: Diagnosis not present

## 2022-07-31 DIAGNOSIS — I5032 Chronic diastolic (congestive) heart failure: Secondary | ICD-10-CM | POA: Diagnosis not present

## 2022-07-31 DIAGNOSIS — M6281 Muscle weakness (generalized): Secondary | ICD-10-CM | POA: Diagnosis not present

## 2022-07-31 DIAGNOSIS — Z7984 Long term (current) use of oral hypoglycemic drugs: Secondary | ICD-10-CM | POA: Diagnosis not present

## 2022-07-31 DIAGNOSIS — Z7985 Long-term (current) use of injectable non-insulin antidiabetic drugs: Secondary | ICD-10-CM | POA: Diagnosis not present

## 2022-07-31 DIAGNOSIS — I252 Old myocardial infarction: Secondary | ICD-10-CM | POA: Diagnosis not present

## 2022-07-31 DIAGNOSIS — E1151 Type 2 diabetes mellitus with diabetic peripheral angiopathy without gangrene: Secondary | ICD-10-CM | POA: Diagnosis not present

## 2022-07-31 DIAGNOSIS — E1142 Type 2 diabetes mellitus with diabetic polyneuropathy: Secondary | ICD-10-CM | POA: Diagnosis not present

## 2022-07-31 DIAGNOSIS — Z4800 Encounter for change or removal of nonsurgical wound dressing: Secondary | ICD-10-CM | POA: Diagnosis not present

## 2022-07-31 DIAGNOSIS — I890 Lymphedema, not elsewhere classified: Secondary | ICD-10-CM | POA: Diagnosis not present

## 2022-07-31 DIAGNOSIS — Z9181 History of falling: Secondary | ICD-10-CM | POA: Diagnosis not present

## 2022-07-31 DIAGNOSIS — M810 Age-related osteoporosis without current pathological fracture: Secondary | ICD-10-CM | POA: Diagnosis not present

## 2022-07-31 DIAGNOSIS — I130 Hypertensive heart and chronic kidney disease with heart failure and stage 1 through stage 4 chronic kidney disease, or unspecified chronic kidney disease: Secondary | ICD-10-CM | POA: Diagnosis not present

## 2022-07-31 DIAGNOSIS — I87311 Chronic venous hypertension (idiopathic) with ulcer of right lower extremity: Secondary | ICD-10-CM | POA: Diagnosis not present

## 2022-07-31 DIAGNOSIS — L97812 Non-pressure chronic ulcer of other part of right lower leg with fat layer exposed: Secondary | ICD-10-CM | POA: Diagnosis not present

## 2022-07-31 DIAGNOSIS — I255 Ischemic cardiomyopathy: Secondary | ICD-10-CM | POA: Diagnosis not present

## 2022-07-31 DIAGNOSIS — Z794 Long term (current) use of insulin: Secondary | ICD-10-CM | POA: Diagnosis not present

## 2022-07-31 DIAGNOSIS — L97512 Non-pressure chronic ulcer of other part of right foot with fat layer exposed: Secondary | ICD-10-CM | POA: Diagnosis not present

## 2022-07-31 DIAGNOSIS — N1831 Chronic kidney disease, stage 3a: Secondary | ICD-10-CM | POA: Diagnosis not present

## 2022-07-31 DIAGNOSIS — E785 Hyperlipidemia, unspecified: Secondary | ICD-10-CM | POA: Diagnosis not present

## 2022-08-03 DIAGNOSIS — I890 Lymphedema, not elsewhere classified: Secondary | ICD-10-CM | POA: Diagnosis not present

## 2022-08-03 DIAGNOSIS — I252 Old myocardial infarction: Secondary | ICD-10-CM | POA: Diagnosis not present

## 2022-08-03 DIAGNOSIS — E785 Hyperlipidemia, unspecified: Secondary | ICD-10-CM | POA: Diagnosis not present

## 2022-08-03 DIAGNOSIS — I87311 Chronic venous hypertension (idiopathic) with ulcer of right lower extremity: Secondary | ICD-10-CM | POA: Diagnosis not present

## 2022-08-03 DIAGNOSIS — Z9181 History of falling: Secondary | ICD-10-CM | POA: Diagnosis not present

## 2022-08-03 DIAGNOSIS — L97512 Non-pressure chronic ulcer of other part of right foot with fat layer exposed: Secondary | ICD-10-CM | POA: Diagnosis not present

## 2022-08-03 DIAGNOSIS — N1831 Chronic kidney disease, stage 3a: Secondary | ICD-10-CM | POA: Diagnosis not present

## 2022-08-03 DIAGNOSIS — M810 Age-related osteoporosis without current pathological fracture: Secondary | ICD-10-CM | POA: Diagnosis not present

## 2022-08-03 DIAGNOSIS — Z7984 Long term (current) use of oral hypoglycemic drugs: Secondary | ICD-10-CM | POA: Diagnosis not present

## 2022-08-03 DIAGNOSIS — M6281 Muscle weakness (generalized): Secondary | ICD-10-CM | POA: Diagnosis not present

## 2022-08-03 DIAGNOSIS — I130 Hypertensive heart and chronic kidney disease with heart failure and stage 1 through stage 4 chronic kidney disease, or unspecified chronic kidney disease: Secondary | ICD-10-CM | POA: Diagnosis not present

## 2022-08-03 DIAGNOSIS — L97812 Non-pressure chronic ulcer of other part of right lower leg with fat layer exposed: Secondary | ICD-10-CM | POA: Diagnosis not present

## 2022-08-03 DIAGNOSIS — Z794 Long term (current) use of insulin: Secondary | ICD-10-CM | POA: Diagnosis not present

## 2022-08-03 DIAGNOSIS — E1151 Type 2 diabetes mellitus with diabetic peripheral angiopathy without gangrene: Secondary | ICD-10-CM | POA: Diagnosis not present

## 2022-08-03 DIAGNOSIS — E1122 Type 2 diabetes mellitus with diabetic chronic kidney disease: Secondary | ICD-10-CM | POA: Diagnosis not present

## 2022-08-03 DIAGNOSIS — I255 Ischemic cardiomyopathy: Secondary | ICD-10-CM | POA: Diagnosis not present

## 2022-08-03 DIAGNOSIS — E1142 Type 2 diabetes mellitus with diabetic polyneuropathy: Secondary | ICD-10-CM | POA: Diagnosis not present

## 2022-08-03 DIAGNOSIS — Z7985 Long-term (current) use of injectable non-insulin antidiabetic drugs: Secondary | ICD-10-CM | POA: Diagnosis not present

## 2022-08-03 DIAGNOSIS — I5032 Chronic diastolic (congestive) heart failure: Secondary | ICD-10-CM | POA: Diagnosis not present

## 2022-08-03 DIAGNOSIS — Z4800 Encounter for change or removal of nonsurgical wound dressing: Secondary | ICD-10-CM | POA: Diagnosis not present

## 2022-08-05 DIAGNOSIS — E1151 Type 2 diabetes mellitus with diabetic peripheral angiopathy without gangrene: Secondary | ICD-10-CM | POA: Diagnosis not present

## 2022-08-05 DIAGNOSIS — I5032 Chronic diastolic (congestive) heart failure: Secondary | ICD-10-CM | POA: Diagnosis not present

## 2022-08-05 DIAGNOSIS — I890 Lymphedema, not elsewhere classified: Secondary | ICD-10-CM | POA: Diagnosis not present

## 2022-08-05 DIAGNOSIS — Z9181 History of falling: Secondary | ICD-10-CM | POA: Diagnosis not present

## 2022-08-05 DIAGNOSIS — I130 Hypertensive heart and chronic kidney disease with heart failure and stage 1 through stage 4 chronic kidney disease, or unspecified chronic kidney disease: Secondary | ICD-10-CM | POA: Diagnosis not present

## 2022-08-05 DIAGNOSIS — L97812 Non-pressure chronic ulcer of other part of right lower leg with fat layer exposed: Secondary | ICD-10-CM | POA: Diagnosis not present

## 2022-08-05 DIAGNOSIS — E1142 Type 2 diabetes mellitus with diabetic polyneuropathy: Secondary | ICD-10-CM | POA: Diagnosis not present

## 2022-08-05 DIAGNOSIS — I255 Ischemic cardiomyopathy: Secondary | ICD-10-CM | POA: Diagnosis not present

## 2022-08-05 DIAGNOSIS — N1831 Chronic kidney disease, stage 3a: Secondary | ICD-10-CM | POA: Diagnosis not present

## 2022-08-05 DIAGNOSIS — M810 Age-related osteoporosis without current pathological fracture: Secondary | ICD-10-CM | POA: Diagnosis not present

## 2022-08-05 DIAGNOSIS — Z794 Long term (current) use of insulin: Secondary | ICD-10-CM | POA: Diagnosis not present

## 2022-08-05 DIAGNOSIS — L97512 Non-pressure chronic ulcer of other part of right foot with fat layer exposed: Secondary | ICD-10-CM | POA: Diagnosis not present

## 2022-08-05 DIAGNOSIS — E1122 Type 2 diabetes mellitus with diabetic chronic kidney disease: Secondary | ICD-10-CM | POA: Diagnosis not present

## 2022-08-05 DIAGNOSIS — Z7984 Long term (current) use of oral hypoglycemic drugs: Secondary | ICD-10-CM | POA: Diagnosis not present

## 2022-08-05 DIAGNOSIS — E785 Hyperlipidemia, unspecified: Secondary | ICD-10-CM | POA: Diagnosis not present

## 2022-08-05 DIAGNOSIS — I87311 Chronic venous hypertension (idiopathic) with ulcer of right lower extremity: Secondary | ICD-10-CM | POA: Diagnosis not present

## 2022-08-05 DIAGNOSIS — Z4800 Encounter for change or removal of nonsurgical wound dressing: Secondary | ICD-10-CM | POA: Diagnosis not present

## 2022-08-05 DIAGNOSIS — M6281 Muscle weakness (generalized): Secondary | ICD-10-CM | POA: Diagnosis not present

## 2022-08-05 DIAGNOSIS — I252 Old myocardial infarction: Secondary | ICD-10-CM | POA: Diagnosis not present

## 2022-08-05 DIAGNOSIS — Z7985 Long-term (current) use of injectable non-insulin antidiabetic drugs: Secondary | ICD-10-CM | POA: Diagnosis not present

## 2022-08-07 DIAGNOSIS — E1151 Type 2 diabetes mellitus with diabetic peripheral angiopathy without gangrene: Secondary | ICD-10-CM | POA: Diagnosis not present

## 2022-08-07 DIAGNOSIS — Z7984 Long term (current) use of oral hypoglycemic drugs: Secondary | ICD-10-CM | POA: Diagnosis not present

## 2022-08-07 DIAGNOSIS — L97512 Non-pressure chronic ulcer of other part of right foot with fat layer exposed: Secondary | ICD-10-CM | POA: Diagnosis not present

## 2022-08-07 DIAGNOSIS — R030 Elevated blood-pressure reading, without diagnosis of hypertension: Secondary | ICD-10-CM | POA: Diagnosis not present

## 2022-08-07 DIAGNOSIS — Z9181 History of falling: Secondary | ICD-10-CM | POA: Diagnosis not present

## 2022-08-07 DIAGNOSIS — I5032 Chronic diastolic (congestive) heart failure: Secondary | ICD-10-CM | POA: Diagnosis not present

## 2022-08-07 DIAGNOSIS — I255 Ischemic cardiomyopathy: Secondary | ICD-10-CM | POA: Diagnosis not present

## 2022-08-07 DIAGNOSIS — I87311 Chronic venous hypertension (idiopathic) with ulcer of right lower extremity: Secondary | ICD-10-CM | POA: Diagnosis not present

## 2022-08-07 DIAGNOSIS — E1122 Type 2 diabetes mellitus with diabetic chronic kidney disease: Secondary | ICD-10-CM | POA: Diagnosis not present

## 2022-08-07 DIAGNOSIS — I252 Old myocardial infarction: Secondary | ICD-10-CM | POA: Diagnosis not present

## 2022-08-07 DIAGNOSIS — Z4800 Encounter for change or removal of nonsurgical wound dressing: Secondary | ICD-10-CM | POA: Diagnosis not present

## 2022-08-07 DIAGNOSIS — M6281 Muscle weakness (generalized): Secondary | ICD-10-CM | POA: Diagnosis not present

## 2022-08-07 DIAGNOSIS — I130 Hypertensive heart and chronic kidney disease with heart failure and stage 1 through stage 4 chronic kidney disease, or unspecified chronic kidney disease: Secondary | ICD-10-CM | POA: Diagnosis not present

## 2022-08-07 DIAGNOSIS — I890 Lymphedema, not elsewhere classified: Secondary | ICD-10-CM | POA: Diagnosis not present

## 2022-08-07 DIAGNOSIS — M810 Age-related osteoporosis without current pathological fracture: Secondary | ICD-10-CM | POA: Diagnosis not present

## 2022-08-07 DIAGNOSIS — E785 Hyperlipidemia, unspecified: Secondary | ICD-10-CM | POA: Diagnosis not present

## 2022-08-07 DIAGNOSIS — I739 Peripheral vascular disease, unspecified: Secondary | ICD-10-CM | POA: Diagnosis not present

## 2022-08-07 DIAGNOSIS — E1142 Type 2 diabetes mellitus with diabetic polyneuropathy: Secondary | ICD-10-CM | POA: Diagnosis not present

## 2022-08-07 DIAGNOSIS — N1831 Chronic kidney disease, stage 3a: Secondary | ICD-10-CM | POA: Diagnosis not present

## 2022-08-07 DIAGNOSIS — Z7985 Long-term (current) use of injectable non-insulin antidiabetic drugs: Secondary | ICD-10-CM | POA: Diagnosis not present

## 2022-08-07 DIAGNOSIS — L97812 Non-pressure chronic ulcer of other part of right lower leg with fat layer exposed: Secondary | ICD-10-CM | POA: Diagnosis not present

## 2022-08-07 DIAGNOSIS — Z794 Long term (current) use of insulin: Secondary | ICD-10-CM | POA: Diagnosis not present

## 2022-08-07 DIAGNOSIS — M869 Osteomyelitis, unspecified: Secondary | ICD-10-CM | POA: Diagnosis not present

## 2022-08-10 ENCOUNTER — Telehealth: Payer: Self-pay

## 2022-08-10 DIAGNOSIS — I255 Ischemic cardiomyopathy: Secondary | ICD-10-CM | POA: Diagnosis not present

## 2022-08-10 DIAGNOSIS — M6281 Muscle weakness (generalized): Secondary | ICD-10-CM | POA: Diagnosis not present

## 2022-08-10 DIAGNOSIS — E1142 Type 2 diabetes mellitus with diabetic polyneuropathy: Secondary | ICD-10-CM | POA: Diagnosis not present

## 2022-08-10 DIAGNOSIS — I5032 Chronic diastolic (congestive) heart failure: Secondary | ICD-10-CM | POA: Diagnosis not present

## 2022-08-10 DIAGNOSIS — E785 Hyperlipidemia, unspecified: Secondary | ICD-10-CM | POA: Diagnosis not present

## 2022-08-10 DIAGNOSIS — L97512 Non-pressure chronic ulcer of other part of right foot with fat layer exposed: Secondary | ICD-10-CM | POA: Diagnosis not present

## 2022-08-10 DIAGNOSIS — L97812 Non-pressure chronic ulcer of other part of right lower leg with fat layer exposed: Secondary | ICD-10-CM | POA: Diagnosis not present

## 2022-08-10 DIAGNOSIS — I130 Hypertensive heart and chronic kidney disease with heart failure and stage 1 through stage 4 chronic kidney disease, or unspecified chronic kidney disease: Secondary | ICD-10-CM | POA: Diagnosis not present

## 2022-08-10 DIAGNOSIS — E1151 Type 2 diabetes mellitus with diabetic peripheral angiopathy without gangrene: Secondary | ICD-10-CM | POA: Diagnosis not present

## 2022-08-10 DIAGNOSIS — Z4800 Encounter for change or removal of nonsurgical wound dressing: Secondary | ICD-10-CM | POA: Diagnosis not present

## 2022-08-10 DIAGNOSIS — I252 Old myocardial infarction: Secondary | ICD-10-CM | POA: Diagnosis not present

## 2022-08-10 DIAGNOSIS — N1831 Chronic kidney disease, stage 3a: Secondary | ICD-10-CM | POA: Diagnosis not present

## 2022-08-10 DIAGNOSIS — I87311 Chronic venous hypertension (idiopathic) with ulcer of right lower extremity: Secondary | ICD-10-CM | POA: Diagnosis not present

## 2022-08-10 DIAGNOSIS — I890 Lymphedema, not elsewhere classified: Secondary | ICD-10-CM | POA: Diagnosis not present

## 2022-08-10 DIAGNOSIS — Z7985 Long-term (current) use of injectable non-insulin antidiabetic drugs: Secondary | ICD-10-CM | POA: Diagnosis not present

## 2022-08-10 DIAGNOSIS — M810 Age-related osteoporosis without current pathological fracture: Secondary | ICD-10-CM | POA: Diagnosis not present

## 2022-08-10 DIAGNOSIS — Z794 Long term (current) use of insulin: Secondary | ICD-10-CM | POA: Diagnosis not present

## 2022-08-10 DIAGNOSIS — Z9181 History of falling: Secondary | ICD-10-CM | POA: Diagnosis not present

## 2022-08-10 DIAGNOSIS — E1122 Type 2 diabetes mellitus with diabetic chronic kidney disease: Secondary | ICD-10-CM | POA: Diagnosis not present

## 2022-08-10 DIAGNOSIS — Z7984 Long term (current) use of oral hypoglycemic drugs: Secondary | ICD-10-CM | POA: Diagnosis not present

## 2022-08-10 NOTE — Telephone Encounter (Signed)
Lupita Leash from [facility] called to schedule pt a f/u with MD, as he is having R 2nd toe amputated this week and Dr. Shelton Silvas is wanting him to f/u with MD here. Pt had already been scheduled for f/u on 8/2 with ABIs. I let Lupita Leash know this and she felt this was sufficient and that his appt did not need to be moved up.

## 2022-08-12 DIAGNOSIS — E1151 Type 2 diabetes mellitus with diabetic peripheral angiopathy without gangrene: Secondary | ICD-10-CM | POA: Diagnosis not present

## 2022-08-12 DIAGNOSIS — N1831 Chronic kidney disease, stage 3a: Secondary | ICD-10-CM | POA: Diagnosis not present

## 2022-08-12 DIAGNOSIS — I252 Old myocardial infarction: Secondary | ICD-10-CM | POA: Diagnosis not present

## 2022-08-12 DIAGNOSIS — I130 Hypertensive heart and chronic kidney disease with heart failure and stage 1 through stage 4 chronic kidney disease, or unspecified chronic kidney disease: Secondary | ICD-10-CM | POA: Diagnosis not present

## 2022-08-12 DIAGNOSIS — I87311 Chronic venous hypertension (idiopathic) with ulcer of right lower extremity: Secondary | ICD-10-CM | POA: Diagnosis not present

## 2022-08-12 DIAGNOSIS — Z794 Long term (current) use of insulin: Secondary | ICD-10-CM | POA: Diagnosis not present

## 2022-08-12 DIAGNOSIS — Z7985 Long-term (current) use of injectable non-insulin antidiabetic drugs: Secondary | ICD-10-CM | POA: Diagnosis not present

## 2022-08-12 DIAGNOSIS — Z4800 Encounter for change or removal of nonsurgical wound dressing: Secondary | ICD-10-CM | POA: Diagnosis not present

## 2022-08-12 DIAGNOSIS — L97812 Non-pressure chronic ulcer of other part of right lower leg with fat layer exposed: Secondary | ICD-10-CM | POA: Diagnosis not present

## 2022-08-12 DIAGNOSIS — E1122 Type 2 diabetes mellitus with diabetic chronic kidney disease: Secondary | ICD-10-CM | POA: Diagnosis not present

## 2022-08-12 DIAGNOSIS — Z7984 Long term (current) use of oral hypoglycemic drugs: Secondary | ICD-10-CM | POA: Diagnosis not present

## 2022-08-12 DIAGNOSIS — E1142 Type 2 diabetes mellitus with diabetic polyneuropathy: Secondary | ICD-10-CM | POA: Diagnosis not present

## 2022-08-12 DIAGNOSIS — Z9181 History of falling: Secondary | ICD-10-CM | POA: Diagnosis not present

## 2022-08-12 DIAGNOSIS — I890 Lymphedema, not elsewhere classified: Secondary | ICD-10-CM | POA: Diagnosis not present

## 2022-08-12 DIAGNOSIS — M6281 Muscle weakness (generalized): Secondary | ICD-10-CM | POA: Diagnosis not present

## 2022-08-12 DIAGNOSIS — M810 Age-related osteoporosis without current pathological fracture: Secondary | ICD-10-CM | POA: Diagnosis not present

## 2022-08-12 DIAGNOSIS — E785 Hyperlipidemia, unspecified: Secondary | ICD-10-CM | POA: Diagnosis not present

## 2022-08-12 DIAGNOSIS — I5032 Chronic diastolic (congestive) heart failure: Secondary | ICD-10-CM | POA: Diagnosis not present

## 2022-08-12 DIAGNOSIS — I255 Ischemic cardiomyopathy: Secondary | ICD-10-CM | POA: Diagnosis not present

## 2022-08-12 DIAGNOSIS — L97512 Non-pressure chronic ulcer of other part of right foot with fat layer exposed: Secondary | ICD-10-CM | POA: Diagnosis not present

## 2022-08-14 DIAGNOSIS — E1122 Type 2 diabetes mellitus with diabetic chronic kidney disease: Secondary | ICD-10-CM | POA: Diagnosis not present

## 2022-08-14 DIAGNOSIS — E10621 Type 1 diabetes mellitus with foot ulcer: Secondary | ICD-10-CM | POA: Diagnosis not present

## 2022-08-14 DIAGNOSIS — I130 Hypertensive heart and chronic kidney disease with heart failure and stage 1 through stage 4 chronic kidney disease, or unspecified chronic kidney disease: Secondary | ICD-10-CM | POA: Diagnosis not present

## 2022-08-14 DIAGNOSIS — M868X7 Other osteomyelitis, ankle and foot: Secondary | ICD-10-CM | POA: Diagnosis not present

## 2022-08-14 DIAGNOSIS — E1169 Type 2 diabetes mellitus with other specified complication: Secondary | ICD-10-CM | POA: Diagnosis not present

## 2022-08-14 DIAGNOSIS — E785 Hyperlipidemia, unspecified: Secondary | ICD-10-CM | POA: Diagnosis not present

## 2022-08-14 DIAGNOSIS — N189 Chronic kidney disease, unspecified: Secondary | ICD-10-CM | POA: Diagnosis not present

## 2022-08-14 DIAGNOSIS — Z79899 Other long term (current) drug therapy: Secondary | ICD-10-CM | POA: Diagnosis not present

## 2022-08-14 DIAGNOSIS — L989 Disorder of the skin and subcutaneous tissue, unspecified: Secondary | ICD-10-CM | POA: Diagnosis not present

## 2022-08-14 DIAGNOSIS — Z7985 Long-term (current) use of injectable non-insulin antidiabetic drugs: Secondary | ICD-10-CM | POA: Diagnosis not present

## 2022-08-14 DIAGNOSIS — I509 Heart failure, unspecified: Secondary | ICD-10-CM | POA: Diagnosis not present

## 2022-08-14 DIAGNOSIS — M86179 Other acute osteomyelitis, unspecified ankle and foot: Secondary | ICD-10-CM | POA: Diagnosis not present

## 2022-08-14 DIAGNOSIS — E11621 Type 2 diabetes mellitus with foot ulcer: Secondary | ICD-10-CM | POA: Diagnosis not present

## 2022-08-14 DIAGNOSIS — I2510 Atherosclerotic heart disease of native coronary artery without angina pectoris: Secondary | ICD-10-CM | POA: Diagnosis not present

## 2022-08-14 DIAGNOSIS — Z882 Allergy status to sulfonamides status: Secondary | ICD-10-CM | POA: Diagnosis not present

## 2022-08-14 DIAGNOSIS — Z7984 Long term (current) use of oral hypoglycemic drugs: Secondary | ICD-10-CM | POA: Diagnosis not present

## 2022-08-14 DIAGNOSIS — M869 Osteomyelitis, unspecified: Secondary | ICD-10-CM | POA: Diagnosis not present

## 2022-08-14 DIAGNOSIS — Z87891 Personal history of nicotine dependence: Secondary | ICD-10-CM | POA: Diagnosis not present

## 2022-08-14 DIAGNOSIS — L97518 Non-pressure chronic ulcer of other part of right foot with other specified severity: Secondary | ICD-10-CM | POA: Diagnosis not present

## 2022-08-17 DIAGNOSIS — E1151 Type 2 diabetes mellitus with diabetic peripheral angiopathy without gangrene: Secondary | ICD-10-CM | POA: Diagnosis not present

## 2022-08-17 DIAGNOSIS — N1831 Chronic kidney disease, stage 3a: Secondary | ICD-10-CM | POA: Diagnosis not present

## 2022-08-17 DIAGNOSIS — Z7985 Long-term (current) use of injectable non-insulin antidiabetic drugs: Secondary | ICD-10-CM | POA: Diagnosis not present

## 2022-08-17 DIAGNOSIS — M6281 Muscle weakness (generalized): Secondary | ICD-10-CM | POA: Diagnosis not present

## 2022-08-17 DIAGNOSIS — M810 Age-related osteoporosis without current pathological fracture: Secondary | ICD-10-CM | POA: Diagnosis not present

## 2022-08-17 DIAGNOSIS — Z4800 Encounter for change or removal of nonsurgical wound dressing: Secondary | ICD-10-CM | POA: Diagnosis not present

## 2022-08-17 DIAGNOSIS — I5032 Chronic diastolic (congestive) heart failure: Secondary | ICD-10-CM | POA: Diagnosis not present

## 2022-08-17 DIAGNOSIS — L97512 Non-pressure chronic ulcer of other part of right foot with fat layer exposed: Secondary | ICD-10-CM | POA: Diagnosis not present

## 2022-08-17 DIAGNOSIS — L89323 Pressure ulcer of left buttock, stage 3: Secondary | ICD-10-CM | POA: Diagnosis not present

## 2022-08-17 DIAGNOSIS — I87311 Chronic venous hypertension (idiopathic) with ulcer of right lower extremity: Secondary | ICD-10-CM | POA: Diagnosis not present

## 2022-08-17 DIAGNOSIS — Z794 Long term (current) use of insulin: Secondary | ICD-10-CM | POA: Diagnosis not present

## 2022-08-17 DIAGNOSIS — Z9181 History of falling: Secondary | ICD-10-CM | POA: Diagnosis not present

## 2022-08-17 DIAGNOSIS — E1142 Type 2 diabetes mellitus with diabetic polyneuropathy: Secondary | ICD-10-CM | POA: Diagnosis not present

## 2022-08-17 DIAGNOSIS — Z7984 Long term (current) use of oral hypoglycemic drugs: Secondary | ICD-10-CM | POA: Diagnosis not present

## 2022-08-17 DIAGNOSIS — E785 Hyperlipidemia, unspecified: Secondary | ICD-10-CM | POA: Diagnosis not present

## 2022-08-17 DIAGNOSIS — I252 Old myocardial infarction: Secondary | ICD-10-CM | POA: Diagnosis not present

## 2022-08-17 DIAGNOSIS — L97812 Non-pressure chronic ulcer of other part of right lower leg with fat layer exposed: Secondary | ICD-10-CM | POA: Diagnosis not present

## 2022-08-17 DIAGNOSIS — I130 Hypertensive heart and chronic kidney disease with heart failure and stage 1 through stage 4 chronic kidney disease, or unspecified chronic kidney disease: Secondary | ICD-10-CM | POA: Diagnosis not present

## 2022-08-17 DIAGNOSIS — I255 Ischemic cardiomyopathy: Secondary | ICD-10-CM | POA: Diagnosis not present

## 2022-08-17 DIAGNOSIS — I890 Lymphedema, not elsewhere classified: Secondary | ICD-10-CM | POA: Diagnosis not present

## 2022-08-17 DIAGNOSIS — E1122 Type 2 diabetes mellitus with diabetic chronic kidney disease: Secondary | ICD-10-CM | POA: Diagnosis not present

## 2022-08-19 DIAGNOSIS — L97512 Non-pressure chronic ulcer of other part of right foot with fat layer exposed: Secondary | ICD-10-CM | POA: Diagnosis not present

## 2022-08-19 DIAGNOSIS — I87311 Chronic venous hypertension (idiopathic) with ulcer of right lower extremity: Secondary | ICD-10-CM | POA: Diagnosis not present

## 2022-08-19 DIAGNOSIS — Z4800 Encounter for change or removal of nonsurgical wound dressing: Secondary | ICD-10-CM | POA: Diagnosis not present

## 2022-08-19 DIAGNOSIS — M6281 Muscle weakness (generalized): Secondary | ICD-10-CM | POA: Diagnosis not present

## 2022-08-19 DIAGNOSIS — E1151 Type 2 diabetes mellitus with diabetic peripheral angiopathy without gangrene: Secondary | ICD-10-CM | POA: Diagnosis not present

## 2022-08-19 DIAGNOSIS — E1142 Type 2 diabetes mellitus with diabetic polyneuropathy: Secondary | ICD-10-CM | POA: Diagnosis not present

## 2022-08-19 DIAGNOSIS — I5032 Chronic diastolic (congestive) heart failure: Secondary | ICD-10-CM | POA: Diagnosis not present

## 2022-08-19 DIAGNOSIS — N1831 Chronic kidney disease, stage 3a: Secondary | ICD-10-CM | POA: Diagnosis not present

## 2022-08-19 DIAGNOSIS — E1122 Type 2 diabetes mellitus with diabetic chronic kidney disease: Secondary | ICD-10-CM | POA: Diagnosis not present

## 2022-08-19 DIAGNOSIS — M810 Age-related osteoporosis without current pathological fracture: Secondary | ICD-10-CM | POA: Diagnosis not present

## 2022-08-19 DIAGNOSIS — I130 Hypertensive heart and chronic kidney disease with heart failure and stage 1 through stage 4 chronic kidney disease, or unspecified chronic kidney disease: Secondary | ICD-10-CM | POA: Diagnosis not present

## 2022-08-19 DIAGNOSIS — I252 Old myocardial infarction: Secondary | ICD-10-CM | POA: Diagnosis not present

## 2022-08-19 DIAGNOSIS — Z794 Long term (current) use of insulin: Secondary | ICD-10-CM | POA: Diagnosis not present

## 2022-08-19 DIAGNOSIS — I255 Ischemic cardiomyopathy: Secondary | ICD-10-CM | POA: Diagnosis not present

## 2022-08-19 DIAGNOSIS — E785 Hyperlipidemia, unspecified: Secondary | ICD-10-CM | POA: Diagnosis not present

## 2022-08-19 DIAGNOSIS — L97812 Non-pressure chronic ulcer of other part of right lower leg with fat layer exposed: Secondary | ICD-10-CM | POA: Diagnosis not present

## 2022-08-19 DIAGNOSIS — I890 Lymphedema, not elsewhere classified: Secondary | ICD-10-CM | POA: Diagnosis not present

## 2022-08-19 DIAGNOSIS — Z7984 Long term (current) use of oral hypoglycemic drugs: Secondary | ICD-10-CM | POA: Diagnosis not present

## 2022-08-19 DIAGNOSIS — Z7985 Long-term (current) use of injectable non-insulin antidiabetic drugs: Secondary | ICD-10-CM | POA: Diagnosis not present

## 2022-08-19 DIAGNOSIS — Z9181 History of falling: Secondary | ICD-10-CM | POA: Diagnosis not present

## 2022-08-21 DIAGNOSIS — I87311 Chronic venous hypertension (idiopathic) with ulcer of right lower extremity: Secondary | ICD-10-CM | POA: Diagnosis not present

## 2022-08-21 DIAGNOSIS — I252 Old myocardial infarction: Secondary | ICD-10-CM | POA: Diagnosis not present

## 2022-08-21 DIAGNOSIS — N1831 Chronic kidney disease, stage 3a: Secondary | ICD-10-CM | POA: Diagnosis not present

## 2022-08-21 DIAGNOSIS — Z4800 Encounter for change or removal of nonsurgical wound dressing: Secondary | ICD-10-CM | POA: Diagnosis not present

## 2022-08-21 DIAGNOSIS — E1142 Type 2 diabetes mellitus with diabetic polyneuropathy: Secondary | ICD-10-CM | POA: Diagnosis not present

## 2022-08-21 DIAGNOSIS — Z7985 Long-term (current) use of injectable non-insulin antidiabetic drugs: Secondary | ICD-10-CM | POA: Diagnosis not present

## 2022-08-21 DIAGNOSIS — E785 Hyperlipidemia, unspecified: Secondary | ICD-10-CM | POA: Diagnosis not present

## 2022-08-21 DIAGNOSIS — Z794 Long term (current) use of insulin: Secondary | ICD-10-CM | POA: Diagnosis not present

## 2022-08-21 DIAGNOSIS — L97512 Non-pressure chronic ulcer of other part of right foot with fat layer exposed: Secondary | ICD-10-CM | POA: Diagnosis not present

## 2022-08-21 DIAGNOSIS — E1122 Type 2 diabetes mellitus with diabetic chronic kidney disease: Secondary | ICD-10-CM | POA: Diagnosis not present

## 2022-08-21 DIAGNOSIS — Z7984 Long term (current) use of oral hypoglycemic drugs: Secondary | ICD-10-CM | POA: Diagnosis not present

## 2022-08-21 DIAGNOSIS — L97812 Non-pressure chronic ulcer of other part of right lower leg with fat layer exposed: Secondary | ICD-10-CM | POA: Diagnosis not present

## 2022-08-21 DIAGNOSIS — I255 Ischemic cardiomyopathy: Secondary | ICD-10-CM | POA: Diagnosis not present

## 2022-08-21 DIAGNOSIS — I5032 Chronic diastolic (congestive) heart failure: Secondary | ICD-10-CM | POA: Diagnosis not present

## 2022-08-21 DIAGNOSIS — I130 Hypertensive heart and chronic kidney disease with heart failure and stage 1 through stage 4 chronic kidney disease, or unspecified chronic kidney disease: Secondary | ICD-10-CM | POA: Diagnosis not present

## 2022-08-21 DIAGNOSIS — M6281 Muscle weakness (generalized): Secondary | ICD-10-CM | POA: Diagnosis not present

## 2022-08-21 DIAGNOSIS — M810 Age-related osteoporosis without current pathological fracture: Secondary | ICD-10-CM | POA: Diagnosis not present

## 2022-08-21 DIAGNOSIS — Z9181 History of falling: Secondary | ICD-10-CM | POA: Diagnosis not present

## 2022-08-21 DIAGNOSIS — I890 Lymphedema, not elsewhere classified: Secondary | ICD-10-CM | POA: Diagnosis not present

## 2022-08-21 DIAGNOSIS — E1151 Type 2 diabetes mellitus with diabetic peripheral angiopathy without gangrene: Secondary | ICD-10-CM | POA: Diagnosis not present

## 2022-08-24 DIAGNOSIS — L97512 Non-pressure chronic ulcer of other part of right foot with fat layer exposed: Secondary | ICD-10-CM | POA: Diagnosis not present

## 2022-08-24 DIAGNOSIS — M810 Age-related osteoporosis without current pathological fracture: Secondary | ICD-10-CM | POA: Diagnosis not present

## 2022-08-24 DIAGNOSIS — E1151 Type 2 diabetes mellitus with diabetic peripheral angiopathy without gangrene: Secondary | ICD-10-CM | POA: Diagnosis not present

## 2022-08-24 DIAGNOSIS — Z4800 Encounter for change or removal of nonsurgical wound dressing: Secondary | ICD-10-CM | POA: Diagnosis not present

## 2022-08-24 DIAGNOSIS — N1831 Chronic kidney disease, stage 3a: Secondary | ICD-10-CM | POA: Diagnosis not present

## 2022-08-24 DIAGNOSIS — Z7985 Long-term (current) use of injectable non-insulin antidiabetic drugs: Secondary | ICD-10-CM | POA: Diagnosis not present

## 2022-08-24 DIAGNOSIS — L97812 Non-pressure chronic ulcer of other part of right lower leg with fat layer exposed: Secondary | ICD-10-CM | POA: Diagnosis not present

## 2022-08-24 DIAGNOSIS — I252 Old myocardial infarction: Secondary | ICD-10-CM | POA: Diagnosis not present

## 2022-08-24 DIAGNOSIS — Z7984 Long term (current) use of oral hypoglycemic drugs: Secondary | ICD-10-CM | POA: Diagnosis not present

## 2022-08-24 DIAGNOSIS — E1122 Type 2 diabetes mellitus with diabetic chronic kidney disease: Secondary | ICD-10-CM | POA: Diagnosis not present

## 2022-08-24 DIAGNOSIS — E785 Hyperlipidemia, unspecified: Secondary | ICD-10-CM | POA: Diagnosis not present

## 2022-08-24 DIAGNOSIS — Z9181 History of falling: Secondary | ICD-10-CM | POA: Diagnosis not present

## 2022-08-24 DIAGNOSIS — I130 Hypertensive heart and chronic kidney disease with heart failure and stage 1 through stage 4 chronic kidney disease, or unspecified chronic kidney disease: Secondary | ICD-10-CM | POA: Diagnosis not present

## 2022-08-24 DIAGNOSIS — I87311 Chronic venous hypertension (idiopathic) with ulcer of right lower extremity: Secondary | ICD-10-CM | POA: Diagnosis not present

## 2022-08-24 DIAGNOSIS — Z794 Long term (current) use of insulin: Secondary | ICD-10-CM | POA: Diagnosis not present

## 2022-08-24 DIAGNOSIS — M6281 Muscle weakness (generalized): Secondary | ICD-10-CM | POA: Diagnosis not present

## 2022-08-24 DIAGNOSIS — I890 Lymphedema, not elsewhere classified: Secondary | ICD-10-CM | POA: Diagnosis not present

## 2022-08-24 DIAGNOSIS — I5032 Chronic diastolic (congestive) heart failure: Secondary | ICD-10-CM | POA: Diagnosis not present

## 2022-08-24 DIAGNOSIS — I255 Ischemic cardiomyopathy: Secondary | ICD-10-CM | POA: Diagnosis not present

## 2022-08-24 DIAGNOSIS — E1142 Type 2 diabetes mellitus with diabetic polyneuropathy: Secondary | ICD-10-CM | POA: Diagnosis not present

## 2022-08-26 DIAGNOSIS — L97516 Non-pressure chronic ulcer of other part of right foot with bone involvement without evidence of necrosis: Secondary | ICD-10-CM | POA: Diagnosis not present

## 2022-08-26 DIAGNOSIS — Z7984 Long term (current) use of oral hypoglycemic drugs: Secondary | ICD-10-CM | POA: Diagnosis not present

## 2022-08-26 DIAGNOSIS — I509 Heart failure, unspecified: Secondary | ICD-10-CM | POA: Diagnosis not present

## 2022-08-26 DIAGNOSIS — Z89421 Acquired absence of other right toe(s): Secondary | ICD-10-CM | POA: Diagnosis not present

## 2022-08-26 DIAGNOSIS — E1169 Type 2 diabetes mellitus with other specified complication: Secondary | ICD-10-CM | POA: Diagnosis not present

## 2022-08-26 DIAGNOSIS — Z87891 Personal history of nicotine dependence: Secondary | ICD-10-CM | POA: Diagnosis not present

## 2022-08-26 DIAGNOSIS — I96 Gangrene, not elsewhere classified: Secondary | ICD-10-CM | POA: Diagnosis not present

## 2022-08-26 DIAGNOSIS — I110 Hypertensive heart disease with heart failure: Secondary | ICD-10-CM | POA: Diagnosis not present

## 2022-08-26 DIAGNOSIS — Z794 Long term (current) use of insulin: Secondary | ICD-10-CM | POA: Diagnosis not present

## 2022-08-26 DIAGNOSIS — E785 Hyperlipidemia, unspecified: Secondary | ICD-10-CM | POA: Diagnosis not present

## 2022-08-26 DIAGNOSIS — M869 Osteomyelitis, unspecified: Secondary | ICD-10-CM | POA: Diagnosis not present

## 2022-08-26 DIAGNOSIS — I890 Lymphedema, not elsewhere classified: Secondary | ICD-10-CM | POA: Diagnosis not present

## 2022-08-26 DIAGNOSIS — E1140 Type 2 diabetes mellitus with diabetic neuropathy, unspecified: Secondary | ICD-10-CM | POA: Diagnosis not present

## 2022-08-26 DIAGNOSIS — E1151 Type 2 diabetes mellitus with diabetic peripheral angiopathy without gangrene: Secondary | ICD-10-CM | POA: Diagnosis not present

## 2022-08-26 DIAGNOSIS — E11621 Type 2 diabetes mellitus with foot ulcer: Secondary | ICD-10-CM | POA: Diagnosis not present

## 2022-08-26 DIAGNOSIS — Z22322 Carrier or suspected carrier of Methicillin resistant Staphylococcus aureus: Secondary | ICD-10-CM | POA: Diagnosis not present

## 2022-08-26 DIAGNOSIS — Z7985 Long-term (current) use of injectable non-insulin antidiabetic drugs: Secondary | ICD-10-CM | POA: Diagnosis not present

## 2022-08-26 DIAGNOSIS — I2510 Atherosclerotic heart disease of native coronary artery without angina pectoris: Secondary | ICD-10-CM | POA: Diagnosis not present

## 2022-08-26 DIAGNOSIS — L97529 Non-pressure chronic ulcer of other part of left foot with unspecified severity: Secondary | ICD-10-CM | POA: Diagnosis not present

## 2022-08-26 DIAGNOSIS — Z7722 Contact with and (suspected) exposure to environmental tobacco smoke (acute) (chronic): Secondary | ICD-10-CM | POA: Diagnosis not present

## 2022-08-26 DIAGNOSIS — A498 Other bacterial infections of unspecified site: Secondary | ICD-10-CM | POA: Diagnosis not present

## 2022-08-28 ENCOUNTER — Encounter: Payer: Self-pay | Admitting: Vascular Surgery

## 2022-08-28 ENCOUNTER — Ambulatory Visit: Payer: Medicare Other | Admitting: Vascular Surgery

## 2022-08-28 ENCOUNTER — Ambulatory Visit (INDEPENDENT_AMBULATORY_CARE_PROVIDER_SITE_OTHER)
Admission: RE | Admit: 2022-08-28 | Discharge: 2022-08-28 | Disposition: A | Discharge status: Home / Self Care | Payer: Medicare Other | Source: Ambulatory Visit | Attending: Vascular Surgery | Admitting: Vascular Surgery

## 2022-08-28 ENCOUNTER — Ambulatory Visit (HOSPITAL_COMMUNITY): Admission: RE | Admit: 2022-08-28 | Payer: Medicare Other | Source: Ambulatory Visit

## 2022-08-28 VITALS — BP 134/60 | HR 62 | Temp 98.2°F | Resp 20 | Ht 68.0 in | Wt 216.0 lb

## 2022-08-28 DIAGNOSIS — I739 Peripheral vascular disease, unspecified: Secondary | ICD-10-CM | POA: Diagnosis not present

## 2022-08-28 DIAGNOSIS — I6521 Occlusion and stenosis of right carotid artery: Secondary | ICD-10-CM

## 2022-08-28 DIAGNOSIS — I70635 Atherosclerosis of nonbiological bypass graft(s) of the right leg with ulceration of other part of foot: Secondary | ICD-10-CM | POA: Diagnosis not present

## 2022-08-28 DIAGNOSIS — I70235 Atherosclerosis of native arteries of right leg with ulceration of other part of foot: Secondary | ICD-10-CM | POA: Diagnosis not present

## 2022-08-28 DIAGNOSIS — I6523 Occlusion and stenosis of bilateral carotid arteries: Secondary | ICD-10-CM | POA: Diagnosis not present

## 2022-08-28 DIAGNOSIS — Z95828 Presence of other vascular implants and grafts: Secondary | ICD-10-CM | POA: Diagnosis not present

## 2022-08-28 LAB — VAS US ABI WITH/WO TBI

## 2022-08-28 NOTE — Progress Notes (Unsigned)
HISTORY AND PHYSICAL   HPI: This is a 72 y.o. male who is here today for follow up for bilateral asymptomatic carotid stenosis, PAD.      I have followed Cari now for quite some time.  At his last appointment, we discussed the medical management of his comorbidities due to recent fall resulting in subdural hematoma with partial effacement of the left lateral ventricle.  His CT neck at the time also demonstrated large protrusion of the C3-C4 with some cord compression.  Umer presents today in much better spirits, he was walking.  He states his rehab has gone well. He denies symptoms of TIA, stroke, amaurosis. He continues his current medication regimen. From a peripheral vascular standpoint, he has a left great toe wound, which has been present for a couple of weeks.  He has been working to keep this dry.  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  He feels like this is healing appropriately.  The pt is on a statin for cholesterol management.    The pt is on an aspirin.    Other AC:  Plavix The pt is on ARB, diuretic for hypertension.  The pt does not have diabetes. Tobacco hx:  former    Past Medical History:  Diagnosis Date   Arthritis    "hands" (12/27/2012)   High cholesterol    Hypertension    Neuropathic pain    PAD (peripheral artery disease) (HCC)    Psoriasis    Stroke (HCC) 1999   "still have some speech problems at times; sometimes forget what I was going to say" (12/27/2012)   Type II diabetes mellitus (HCC)    Ulcer    Left ankle/leg    Past Surgical History:  Procedure Laterality Date   ABDOMINAL AORTAGRAM N/A 03/30/2012   Procedure: ABDOMINAL Ronny Flurry;  Surgeon: Nada Libman, MD;  Location: [facility] CATH LAB;  Service: Cardiovascular;  Laterality: N/A;   ABDOMINAL AORTAGRAM N/A 12/27/2012   Procedure: ABDOMINAL Ronny Flurry;  Surgeon: Nada Libman, MD;  Location: [facility] CATH LAB;  Service: Cardiovascular;  Laterality: N/A;   ABDOMINAL AORTOGRAM  W/LOWER EXTREMITY N/A 04/05/2017   Procedure: ABDOMINAL AORTOGRAM W/LOWER EXTREMITY;  Surgeon: Sherren Kerns, MD;  Location: [facility] INVASIVE CV LAB;  Service: Cardiovascular;  Laterality: N/A;   AMPUTATION Right 04/26/2017   Procedure: AMPUTATION TRANSMETATARSAL RIGHT GREAT TOE;  Surgeon: Larina Earthly, MD;  Location: [facility] OR;  Service: Vascular;  Laterality: Right;   ANGIOPLASTY / STENTING FEMORAL Left 12/27/2012   APPLICATION OF WOUND VAC Right 04/26/2017   Procedure: APPLICATION OF WOUND VAC;  Surgeon: Larina Earthly, MD;  Location: [facility] OR;  Service: Vascular;  Laterality: Right;   FEMORAL ARTERY STENT  03/30/2012   LOWER EXTREMITY ANGIOGRAM  12/27/2012   Procedure: LOWER EXTREMITY ANGIOGRAM;  Surgeon: Nada Libman, MD;  Location: [facility] CATH LAB;  Service: Cardiovascular;;   LOWER EXTREMITY ANGIOGRAPHY N/A 04/19/2018   Procedure: LOWER EXTREMITY ANGIOGRAPHY;  Surgeon: Nada Libman, MD;  Location: [facility] INVASIVE CV LAB;  Service: Cardiovascular;  Laterality: N/A;   PERIPHERAL VASCULAR ATHERECTOMY Right 04/05/2017   Procedure: PERIPHERAL VASCULAR ATHERECTOMY;  Surgeon: Sherren Kerns, MD;  Location: [facility] INVASIVE CV LAB;  Service: Cardiovascular;  Laterality: Right;  superficial femoral   PERIPHERAL VASCULAR BALLOON ANGIOPLASTY  04/19/2018   Procedure: PERIPHERAL VASCULAR BALLOON ANGIOPLASTY;  Surgeon: Nada Libman, MD;  Location: [facility] INVASIVE CV LAB;  Service: Cardiovascular;;   PERIPHERAL VASCULAR INTERVENTION Right 04/05/2017  Procedure: PERIPHERAL VASCULAR INTERVENTION;  Surgeon: Sherren Kerns, MD;  Location: [facility] INVASIVE CV LAB;  Service: Cardiovascular;  Laterality: Right;   Superficial femorl and external iliac   POPLITEAL ARTERY STENT     TONSILLECTOMY AND ADENOIDECTOMY      Allergies  Allergen Reactions   Oxycodone Nausea And Vomiting   Glipizide     Bad headache and blurred vision   Sulfamethoxazole    Trimethoprim    Bactrim [Sulfamethoxazole-Trimethoprim] Itching and Rash     Current Outpatient Medications  Medication Sig Dispense Refill   acetaminophen (TYLENOL) 325 MG tablet Take 2 tablets (650 mg total) by mouth 4 (four) times daily -  with meals and at bedtime. 100 tablet 0   aspirin EC 81 MG tablet Take 1 tablet (81 mg total) by mouth daily. Swallow whole.     atorvastatin (LIPITOR) 20 MG tablet Take 1 tablet (20 mg total) by mouth at bedtime. Too soon to refill 30 tablet 0   bisoprolol (ZEBETA) 5 MG tablet Take 0.5 tablets (2.5 mg total) by mouth daily. 30 tablet 0   Dulaglutide (TRULICITY) 1.5 MG/0.5ML SOPN Inject 1.5 mg into the skin once a week.     empagliflozin (JARDIANCE) 10 MG TABS tablet Take 1 tablet (10 mg total) by mouth daily. 90 tablet 3   furosemide (LASIX) 20 MG tablet Take 1 tablet (20 mg total) by mouth daily. 30 tablet 0   gabapentin (NEURONTIN) 600 MG tablet Take 1 tablet (600 mg total) by mouth every evening. 30 tablet 0   methocarbamol (ROBAXIN) 750 MG tablet Take 1 tablet (750 mg total) by mouth every 8 (eight) hours. 90 tablet 0   Podiatric Products (GOLD BOND FOOT) CREA Apply 1 application topically 2 (two) times daily as needed (DRY SKIN). DIABETIC CREAM     fluticasone (FLONASE) 50 MCG/ACT nasal spray Place 2 sprays into both nostrils daily as needed for allergies. (Patient not taking: Reported on 08/28/2022)     losartan (COZAAR) 25 MG tablet Take 0.5 tablets (12.5 mg total) by mouth daily. Too soon to refill (Patient not taking: Reported on 06/09/2022) 30 tablet 0   NOVOLIN 70/30 RELION (70-30) 100 UNIT/ML injection Inject 16 Units into the skin every evening. (Patient not taking: Reported on 06/09/2022) 5 mL 12   polyethylene glycol powder (GLYCOLAX/MIRALAX) 17 GM/SCOOP powder Take 17 g by mouth daily. (Patient not taking: Reported on 08/28/2022) 238 g 0   senna-docusate (SENOKOT-S) 8.6-50 MG tablet Take 1 tablet by mouth 2 (two) times daily. (Patient not taking: Reported on 08/28/2022) 60 tablet 0   traZODone (DESYREL) 50 MG tablet Take  0.5 tablets (25 mg total) by mouth at bedtime as needed for sleep. (Patient not taking: Reported on 06/09/2022) 15 tablet 0   triamcinolone cream (KENALOG) 0.1 % Apply 1 Application topically 2 (two) times daily. Mix with OTC eucerin 1:1. (Patient not taking: Reported on 08/28/2022) 120 g 0   No current facility-administered medications for this visit.    Family History  Problem Relation Age of Onset   Diabetes Sister        Bilateral amputation of lower legs   Diabetes Sister    Heart disease Sister 23       Heart disease before age 13   Hypertension Sister    Heart attack Sister    Hyperlipidemia Sister    Hypertension Father    Hyperlipidemia Father    Hyperlipidemia Mother    Hypertension Mother    Hypertension Daughter  Social History   Socioeconomic History   Marital status: Single    Spouse name: Not on file   Number of children: Not on file   Years of education: Not on file   Highest education level: Not on file  Occupational History   Not on file  Tobacco Use   Smoking status: Former    Current packs/day: 0.00    Average packs/day: 3.0 packs/day for 45.0 years (135.0 ttl pk-yrs)    Types: Cigarettes    Start date: 01/06/1966    Quit date: 01/07/2011    Years since quitting: 11.6    Passive exposure: Never   Smokeless tobacco: Never  Vaping Use   Vaping status: Never Used  Substance and Sexual Activity   Alcohol use: Not Currently    Comment: 12/27/2012 "couple times/yr I might have a drink"   Drug use: No   Sexual activity: Never  Other Topics Concern   Not on file  Social History Narrative   Not on file   Social Determinants of Health   Financial Resource Strain: Not on file  Food Insecurity: Not on file  Transportation Needs: Not on file  Physical Activity: Not on file  Stress: Not on file  Social Connections: Unknown (08/03/2022)   Received from [facility]   Social Network    Social Network: Not on file  Intimate Partner Violence: Unknown  (08/03/2022)   Received from [facility]   HITS    Physically Hurt: Not on file    Insult or Talk Down To: Not on file    Threaten Physical Harm: Not on file    Scream or Curse: Not on file     REVIEW OF SYSTEMS:   [X]  denotes positive finding, [ ]  denotes negative finding Cardiac  Comments:  Chest pain or chest pressure:    Shortness of breath upon exertion: x   Short of breath when lying flat:    Irregular heart rhythm:        Vascular    Pain in calf, thigh, or hip brought on by ambulation: x   Pain in feet at night that wakes you up from your sleep:     Blood clot in your veins:    Leg swelling:         Pulmonary    Oxygen at home:    Productive cough:     Wheezing:         Neurologic    Sudden weakness in arms or legs:     Sudden numbness in arms or legs:     Sudden onset of difficulty speaking or slurred speech:    Temporary loss of vision in one eye:     Problems with dizziness:         Gastrointestinal    Blood in stool:     Vomited blood:         Genitourinary    Burning when urinating:     Blood in urine:        Psychiatric    Major depression:         Hematologic    Bleeding problems:    Problems with blood clotting too easily:        Skin    Rashes or ulcers: x       Constitutional    Fever or chills:      PHYSICAL EXAMINATION:  Today's Vitals   08/28/22 1518 08/28/22 1520  BP: 131/63 134/60  Pulse: 62  Resp: 20   Temp: 98.2 F (36.8 C)   SpO2: 98%   Weight: 216 lb (98 kg)   Height: 5\' 8"  (1.727 m)     Body mass index is 32.84 kg/m.   General:  WDWN in NAD; vital signs documented above Gait: Not observed HENT: WNL, normocephalic Pulmonary: normal non-labored breathing , without wheezing Cardiac: regular HR, with carotid bruit on the right Abdomen: soft, NT Skin: with rash in left groin that is malodorous Vascular Exam/Pulses:  Right Left  Radial 2+ (normal) 2+ (normal)  Femoral Unable to palpate Unable to palpate  DP  monophasic Dampened monophasic  PT Brisk biphasic Brisk monophasic  Peroneal monophasic Dampened monophasic   Extremities: without ischemic changes, without Gangrene , without cellulitis; with superficial wound LLE with BLE swelling Musculoskeletal: no muscle wasting or atrophy  Neurologic: A&O X 3 Psychiatric:  The pt has Normal affect.   Non-Invasive Vascular Imaging:    Summary:  Right Carotid: Velocities in the right ICA are consistent with a 80-99%                 stenosis.   Left Carotid: Velocities in the left ICA are consistent with a 40-59%  stenosis.   Vertebrals: Bilateral vertebral arteries demonstrate antegrade flow.   *See table(s) above for measurements and observations.   _________________________________________________________  ABI Findings:  +--------+------------------+-----+----------+--------+  Right  Rt Pressure (mmHg)IndexWaveform  Comment   +--------+------------------+-----+----------+--------+  [id number]                                       +--------+------------------+-----+----------+--------+  PTA    Noncompressible        monophasic          +--------+------------------+-----+----------+--------+  DP     Noncompressible        monophasic          +--------+------------------+-----+----------+--------+   +---------+------------------+-----+----------+-------+  Left    Lt Pressure (mmHg)IndexWaveform  Comment  +---------+------------------+-----+----------+-------+  Brachial 150                                       +---------+------------------+-----+----------+-------+  PTA     Noncompressible        monophasic         +---------+------------------+-----+----------+-------+  DP      Noncompressible        monophasic         +---------+------------------+-----+----------+-------+  Great Toe17                0.11                    +---------+------------------+-----+----------+-------+      ASSESSMENT/PLAN:: 72 y.o. male who presents to clinic with asymptomatic bilateral carotid stenosis.  The right side has been critically stenosed since his last visit.  Patient also has right-sided fourth toe amputation wound which appears to be healing nicely, left-sided first toe ulceration with healthy granulating tissue.  From a vascular standpoint, Alfredo has several vascular beds and that could benefit from treatment.   Cerebrovascular: He continues to have critical right ICA stenosis.  At the time of his fall, he was taken off Plavix indefinitely, I am unsure if he is unable to tolerate heparin, and need to reach out to neurosurgery regarding risks.  Peripheral arterial disease: Seaver has a  palpable dorsalis pedis on the right, and the toe wound, which was done with local anesthesia due to his cardiovascular comorbidities, appears to be healing.  On the left, I debrided the devitalized tissue surrounding his left great toe wound.  The wound bed is healthy.  There is no bleeding.  No signs of infection or necrosis. Technically, with his toe pressures, Harless has critical limb ischemia with tissue loss at the left first digit.  Being that it is healing, I am more inclined to watch this closely rather than offer an operation due to his risk profile.  I called Philipe's daughter after our visit to discuss the above with her.  I asked that she come to his next visit with me.  I plan to see him in 1 month's time in an effort to evaluate wound healing in the lower extremities as well as talk about his asymptomatic carotid stenosis.  I am hopeful that his toe wounds will continue to heal.  Should he or his daughter choose to pursue carotid revascularization, I would plan for TCAR under moderate sedation. I would need clearance from both neurosurgery as well as cardiology.  A year ago, when I attempted cardiology clearance, Ithan elected not to proceed as he did not want to undergo cardiac cath.  If  this is the case, we will continue to treat his asymptomatic carotid stenosis medically.  I also plan to insonate his bilateral lower extremity arterial stents to assess patency.  I asked Ajax and his daughter to call me immediately should they feel as though his wounds were progressing rapidly.   Victorino Sparrow MD

## 2022-08-31 DIAGNOSIS — I130 Hypertensive heart and chronic kidney disease with heart failure and stage 1 through stage 4 chronic kidney disease, or unspecified chronic kidney disease: Secondary | ICD-10-CM | POA: Diagnosis not present

## 2022-08-31 DIAGNOSIS — I87311 Chronic venous hypertension (idiopathic) with ulcer of right lower extremity: Secondary | ICD-10-CM | POA: Diagnosis not present

## 2022-08-31 DIAGNOSIS — Z7984 Long term (current) use of oral hypoglycemic drugs: Secondary | ICD-10-CM | POA: Diagnosis not present

## 2022-08-31 DIAGNOSIS — E1151 Type 2 diabetes mellitus with diabetic peripheral angiopathy without gangrene: Secondary | ICD-10-CM | POA: Diagnosis not present

## 2022-08-31 DIAGNOSIS — N1831 Chronic kidney disease, stage 3a: Secondary | ICD-10-CM | POA: Diagnosis not present

## 2022-08-31 DIAGNOSIS — I5032 Chronic diastolic (congestive) heart failure: Secondary | ICD-10-CM | POA: Diagnosis not present

## 2022-08-31 DIAGNOSIS — L97512 Non-pressure chronic ulcer of other part of right foot with fat layer exposed: Secondary | ICD-10-CM | POA: Diagnosis not present

## 2022-08-31 DIAGNOSIS — Z794 Long term (current) use of insulin: Secondary | ICD-10-CM | POA: Diagnosis not present

## 2022-08-31 DIAGNOSIS — I252 Old myocardial infarction: Secondary | ICD-10-CM | POA: Diagnosis not present

## 2022-08-31 DIAGNOSIS — Z7985 Long-term (current) use of injectable non-insulin antidiabetic drugs: Secondary | ICD-10-CM | POA: Diagnosis not present

## 2022-08-31 DIAGNOSIS — M6281 Muscle weakness (generalized): Secondary | ICD-10-CM | POA: Diagnosis not present

## 2022-08-31 DIAGNOSIS — L97812 Non-pressure chronic ulcer of other part of right lower leg with fat layer exposed: Secondary | ICD-10-CM | POA: Diagnosis not present

## 2022-08-31 DIAGNOSIS — E1122 Type 2 diabetes mellitus with diabetic chronic kidney disease: Secondary | ICD-10-CM | POA: Diagnosis not present

## 2022-08-31 DIAGNOSIS — Z9181 History of falling: Secondary | ICD-10-CM | POA: Diagnosis not present

## 2022-08-31 DIAGNOSIS — Z4800 Encounter for change or removal of nonsurgical wound dressing: Secondary | ICD-10-CM | POA: Diagnosis not present

## 2022-08-31 DIAGNOSIS — E785 Hyperlipidemia, unspecified: Secondary | ICD-10-CM | POA: Diagnosis not present

## 2022-08-31 DIAGNOSIS — M810 Age-related osteoporosis without current pathological fracture: Secondary | ICD-10-CM | POA: Diagnosis not present

## 2022-08-31 DIAGNOSIS — I890 Lymphedema, not elsewhere classified: Secondary | ICD-10-CM | POA: Diagnosis not present

## 2022-08-31 DIAGNOSIS — I255 Ischemic cardiomyopathy: Secondary | ICD-10-CM | POA: Diagnosis not present

## 2022-08-31 DIAGNOSIS — E1142 Type 2 diabetes mellitus with diabetic polyneuropathy: Secondary | ICD-10-CM | POA: Diagnosis not present

## 2022-09-02 DIAGNOSIS — Z7984 Long term (current) use of oral hypoglycemic drugs: Secondary | ICD-10-CM | POA: Diagnosis not present

## 2022-09-02 DIAGNOSIS — Z79899 Other long term (current) drug therapy: Secondary | ICD-10-CM | POA: Diagnosis not present

## 2022-09-02 DIAGNOSIS — Z89421 Acquired absence of other right toe(s): Secondary | ICD-10-CM | POA: Diagnosis not present

## 2022-09-02 DIAGNOSIS — I110 Hypertensive heart disease with heart failure: Secondary | ICD-10-CM | POA: Diagnosis not present

## 2022-09-02 DIAGNOSIS — E11621 Type 2 diabetes mellitus with foot ulcer: Secondary | ICD-10-CM | POA: Diagnosis not present

## 2022-09-02 DIAGNOSIS — L97529 Non-pressure chronic ulcer of other part of left foot with unspecified severity: Secondary | ICD-10-CM | POA: Diagnosis not present

## 2022-09-02 DIAGNOSIS — Z87891 Personal history of nicotine dependence: Secondary | ICD-10-CM | POA: Diagnosis not present

## 2022-09-02 DIAGNOSIS — L97516 Non-pressure chronic ulcer of other part of right foot with bone involvement without evidence of necrosis: Secondary | ICD-10-CM | POA: Diagnosis not present

## 2022-09-02 DIAGNOSIS — Z8673 Personal history of transient ischemic attack (TIA), and cerebral infarction without residual deficits: Secondary | ICD-10-CM | POA: Diagnosis not present

## 2022-09-02 DIAGNOSIS — Z794 Long term (current) use of insulin: Secondary | ICD-10-CM | POA: Diagnosis not present

## 2022-09-02 DIAGNOSIS — E785 Hyperlipidemia, unspecified: Secondary | ICD-10-CM | POA: Diagnosis not present

## 2022-09-02 DIAGNOSIS — Z7722 Contact with and (suspected) exposure to environmental tobacco smoke (acute) (chronic): Secondary | ICD-10-CM | POA: Diagnosis not present

## 2022-09-02 DIAGNOSIS — I252 Old myocardial infarction: Secondary | ICD-10-CM | POA: Diagnosis not present

## 2022-09-02 DIAGNOSIS — E1151 Type 2 diabetes mellitus with diabetic peripheral angiopathy without gangrene: Secondary | ICD-10-CM | POA: Diagnosis not present

## 2022-09-02 DIAGNOSIS — Z792 Long term (current) use of antibiotics: Secondary | ICD-10-CM | POA: Diagnosis not present

## 2022-09-02 DIAGNOSIS — I2510 Atherosclerotic heart disease of native coronary artery without angina pectoris: Secondary | ICD-10-CM | POA: Diagnosis not present

## 2022-09-02 DIAGNOSIS — Z882 Allergy status to sulfonamides status: Secondary | ICD-10-CM | POA: Diagnosis not present

## 2022-09-02 DIAGNOSIS — E1140 Type 2 diabetes mellitus with diabetic neuropathy, unspecified: Secondary | ICD-10-CM | POA: Diagnosis not present

## 2022-09-02 DIAGNOSIS — I509 Heart failure, unspecified: Secondary | ICD-10-CM | POA: Diagnosis not present

## 2022-09-04 ENCOUNTER — Other Ambulatory Visit: Payer: Self-pay

## 2022-09-04 DIAGNOSIS — I5032 Chronic diastolic (congestive) heart failure: Secondary | ICD-10-CM | POA: Diagnosis not present

## 2022-09-04 DIAGNOSIS — M6281 Muscle weakness (generalized): Secondary | ICD-10-CM | POA: Diagnosis not present

## 2022-09-04 DIAGNOSIS — Z7985 Long-term (current) use of injectable non-insulin antidiabetic drugs: Secondary | ICD-10-CM | POA: Diagnosis not present

## 2022-09-04 DIAGNOSIS — E785 Hyperlipidemia, unspecified: Secondary | ICD-10-CM | POA: Diagnosis not present

## 2022-09-04 DIAGNOSIS — I739 Peripheral vascular disease, unspecified: Secondary | ICD-10-CM

## 2022-09-04 DIAGNOSIS — I130 Hypertensive heart and chronic kidney disease with heart failure and stage 1 through stage 4 chronic kidney disease, or unspecified chronic kidney disease: Secondary | ICD-10-CM | POA: Diagnosis not present

## 2022-09-04 DIAGNOSIS — E1142 Type 2 diabetes mellitus with diabetic polyneuropathy: Secondary | ICD-10-CM | POA: Diagnosis not present

## 2022-09-04 DIAGNOSIS — I255 Ischemic cardiomyopathy: Secondary | ICD-10-CM | POA: Diagnosis not present

## 2022-09-04 DIAGNOSIS — I87311 Chronic venous hypertension (idiopathic) with ulcer of right lower extremity: Secondary | ICD-10-CM | POA: Diagnosis not present

## 2022-09-04 DIAGNOSIS — I252 Old myocardial infarction: Secondary | ICD-10-CM | POA: Diagnosis not present

## 2022-09-04 DIAGNOSIS — L97512 Non-pressure chronic ulcer of other part of right foot with fat layer exposed: Secondary | ICD-10-CM | POA: Diagnosis not present

## 2022-09-04 DIAGNOSIS — Z7984 Long term (current) use of oral hypoglycemic drugs: Secondary | ICD-10-CM | POA: Diagnosis not present

## 2022-09-04 DIAGNOSIS — E1151 Type 2 diabetes mellitus with diabetic peripheral angiopathy without gangrene: Secondary | ICD-10-CM | POA: Diagnosis not present

## 2022-09-04 DIAGNOSIS — L97812 Non-pressure chronic ulcer of other part of right lower leg with fat layer exposed: Secondary | ICD-10-CM | POA: Diagnosis not present

## 2022-09-04 DIAGNOSIS — E1122 Type 2 diabetes mellitus with diabetic chronic kidney disease: Secondary | ICD-10-CM | POA: Diagnosis not present

## 2022-09-04 DIAGNOSIS — Z4800 Encounter for change or removal of nonsurgical wound dressing: Secondary | ICD-10-CM | POA: Diagnosis not present

## 2022-09-04 DIAGNOSIS — N1831 Chronic kidney disease, stage 3a: Secondary | ICD-10-CM | POA: Diagnosis not present

## 2022-09-04 DIAGNOSIS — M810 Age-related osteoporosis without current pathological fracture: Secondary | ICD-10-CM | POA: Diagnosis not present

## 2022-09-04 DIAGNOSIS — Z794 Long term (current) use of insulin: Secondary | ICD-10-CM | POA: Diagnosis not present

## 2022-09-04 DIAGNOSIS — Z9181 History of falling: Secondary | ICD-10-CM | POA: Diagnosis not present

## 2022-09-04 DIAGNOSIS — I890 Lymphedema, not elsewhere classified: Secondary | ICD-10-CM | POA: Diagnosis not present

## 2022-09-07 DIAGNOSIS — Z7984 Long term (current) use of oral hypoglycemic drugs: Secondary | ICD-10-CM | POA: Diagnosis not present

## 2022-09-07 DIAGNOSIS — I5032 Chronic diastolic (congestive) heart failure: Secondary | ICD-10-CM | POA: Diagnosis not present

## 2022-09-07 DIAGNOSIS — Z7985 Long-term (current) use of injectable non-insulin antidiabetic drugs: Secondary | ICD-10-CM | POA: Diagnosis not present

## 2022-09-07 DIAGNOSIS — I255 Ischemic cardiomyopathy: Secondary | ICD-10-CM | POA: Diagnosis not present

## 2022-09-07 DIAGNOSIS — L97812 Non-pressure chronic ulcer of other part of right lower leg with fat layer exposed: Secondary | ICD-10-CM | POA: Diagnosis not present

## 2022-09-07 DIAGNOSIS — M6281 Muscle weakness (generalized): Secondary | ICD-10-CM | POA: Diagnosis not present

## 2022-09-07 DIAGNOSIS — E1122 Type 2 diabetes mellitus with diabetic chronic kidney disease: Secondary | ICD-10-CM | POA: Diagnosis not present

## 2022-09-07 DIAGNOSIS — I890 Lymphedema, not elsewhere classified: Secondary | ICD-10-CM | POA: Diagnosis not present

## 2022-09-07 DIAGNOSIS — B351 Tinea unguium: Secondary | ICD-10-CM | POA: Diagnosis not present

## 2022-09-07 DIAGNOSIS — N1831 Chronic kidney disease, stage 3a: Secondary | ICD-10-CM | POA: Diagnosis not present

## 2022-09-07 DIAGNOSIS — I252 Old myocardial infarction: Secondary | ICD-10-CM | POA: Diagnosis not present

## 2022-09-07 DIAGNOSIS — Z794 Long term (current) use of insulin: Secondary | ICD-10-CM | POA: Diagnosis not present

## 2022-09-07 DIAGNOSIS — I130 Hypertensive heart and chronic kidney disease with heart failure and stage 1 through stage 4 chronic kidney disease, or unspecified chronic kidney disease: Secondary | ICD-10-CM | POA: Diagnosis not present

## 2022-09-07 DIAGNOSIS — Z9181 History of falling: Secondary | ICD-10-CM | POA: Diagnosis not present

## 2022-09-07 DIAGNOSIS — I87311 Chronic venous hypertension (idiopathic) with ulcer of right lower extremity: Secondary | ICD-10-CM | POA: Diagnosis not present

## 2022-09-07 DIAGNOSIS — E1151 Type 2 diabetes mellitus with diabetic peripheral angiopathy without gangrene: Secondary | ICD-10-CM | POA: Diagnosis not present

## 2022-09-07 DIAGNOSIS — Z4800 Encounter for change or removal of nonsurgical wound dressing: Secondary | ICD-10-CM | POA: Diagnosis not present

## 2022-09-07 DIAGNOSIS — L97512 Non-pressure chronic ulcer of other part of right foot with fat layer exposed: Secondary | ICD-10-CM | POA: Diagnosis not present

## 2022-09-07 DIAGNOSIS — M810 Age-related osteoporosis without current pathological fracture: Secondary | ICD-10-CM | POA: Diagnosis not present

## 2022-09-07 DIAGNOSIS — M79671 Pain in right foot: Secondary | ICD-10-CM | POA: Diagnosis not present

## 2022-09-07 DIAGNOSIS — E785 Hyperlipidemia, unspecified: Secondary | ICD-10-CM | POA: Diagnosis not present

## 2022-09-07 DIAGNOSIS — E1142 Type 2 diabetes mellitus with diabetic polyneuropathy: Secondary | ICD-10-CM | POA: Diagnosis not present

## 2022-09-07 DIAGNOSIS — M79672 Pain in left foot: Secondary | ICD-10-CM | POA: Diagnosis not present

## 2022-09-09 DIAGNOSIS — Z7984 Long term (current) use of oral hypoglycemic drugs: Secondary | ICD-10-CM | POA: Diagnosis not present

## 2022-09-09 DIAGNOSIS — Z89421 Acquired absence of other right toe(s): Secondary | ICD-10-CM | POA: Diagnosis not present

## 2022-09-09 DIAGNOSIS — S91302A Unspecified open wound, left foot, initial encounter: Secondary | ICD-10-CM | POA: Diagnosis not present

## 2022-09-09 DIAGNOSIS — E11621 Type 2 diabetes mellitus with foot ulcer: Secondary | ICD-10-CM | POA: Diagnosis not present

## 2022-09-09 DIAGNOSIS — I509 Heart failure, unspecified: Secondary | ICD-10-CM | POA: Diagnosis not present

## 2022-09-09 DIAGNOSIS — M86171 Other acute osteomyelitis, right ankle and foot: Secondary | ICD-10-CM | POA: Diagnosis not present

## 2022-09-09 DIAGNOSIS — L97529 Non-pressure chronic ulcer of other part of left foot with unspecified severity: Secondary | ICD-10-CM | POA: Diagnosis not present

## 2022-09-09 DIAGNOSIS — Z792 Long term (current) use of antibiotics: Secondary | ICD-10-CM | POA: Diagnosis not present

## 2022-09-09 DIAGNOSIS — E1151 Type 2 diabetes mellitus with diabetic peripheral angiopathy without gangrene: Secondary | ICD-10-CM | POA: Diagnosis not present

## 2022-09-09 DIAGNOSIS — Z882 Allergy status to sulfonamides status: Secondary | ICD-10-CM | POA: Diagnosis not present

## 2022-09-09 DIAGNOSIS — Z8673 Personal history of transient ischemic attack (TIA), and cerebral infarction without residual deficits: Secondary | ICD-10-CM | POA: Diagnosis not present

## 2022-09-09 DIAGNOSIS — Z87891 Personal history of nicotine dependence: Secondary | ICD-10-CM | POA: Diagnosis not present

## 2022-09-09 DIAGNOSIS — Z79899 Other long term (current) drug therapy: Secondary | ICD-10-CM | POA: Diagnosis not present

## 2022-09-09 DIAGNOSIS — Z794 Long term (current) use of insulin: Secondary | ICD-10-CM | POA: Diagnosis not present

## 2022-09-09 DIAGNOSIS — I252 Old myocardial infarction: Secondary | ICD-10-CM | POA: Diagnosis not present

## 2022-09-09 DIAGNOSIS — E1140 Type 2 diabetes mellitus with diabetic neuropathy, unspecified: Secondary | ICD-10-CM | POA: Diagnosis not present

## 2022-09-09 DIAGNOSIS — L97516 Non-pressure chronic ulcer of other part of right foot with bone involvement without evidence of necrosis: Secondary | ICD-10-CM | POA: Diagnosis not present

## 2022-09-09 DIAGNOSIS — E785 Hyperlipidemia, unspecified: Secondary | ICD-10-CM | POA: Diagnosis not present

## 2022-09-09 DIAGNOSIS — Z7722 Contact with and (suspected) exposure to environmental tobacco smoke (acute) (chronic): Secondary | ICD-10-CM | POA: Diagnosis not present

## 2022-09-09 DIAGNOSIS — I110 Hypertensive heart disease with heart failure: Secondary | ICD-10-CM | POA: Diagnosis not present

## 2022-09-09 DIAGNOSIS — I2510 Atherosclerotic heart disease of native coronary artery without angina pectoris: Secondary | ICD-10-CM | POA: Diagnosis not present

## 2022-09-09 DIAGNOSIS — Z89429 Acquired absence of other toe(s), unspecified side: Secondary | ICD-10-CM | POA: Diagnosis not present

## 2022-09-11 ENCOUNTER — Telehealth: Payer: Self-pay

## 2022-09-11 DIAGNOSIS — E1142 Type 2 diabetes mellitus with diabetic polyneuropathy: Secondary | ICD-10-CM | POA: Diagnosis not present

## 2022-09-11 DIAGNOSIS — L97512 Non-pressure chronic ulcer of other part of right foot with fat layer exposed: Secondary | ICD-10-CM | POA: Diagnosis not present

## 2022-09-11 DIAGNOSIS — Z9181 History of falling: Secondary | ICD-10-CM | POA: Diagnosis not present

## 2022-09-11 DIAGNOSIS — Z7985 Long-term (current) use of injectable non-insulin antidiabetic drugs: Secondary | ICD-10-CM | POA: Diagnosis not present

## 2022-09-11 DIAGNOSIS — Z794 Long term (current) use of insulin: Secondary | ICD-10-CM | POA: Diagnosis not present

## 2022-09-11 DIAGNOSIS — Z7984 Long term (current) use of oral hypoglycemic drugs: Secondary | ICD-10-CM | POA: Diagnosis not present

## 2022-09-11 DIAGNOSIS — I5032 Chronic diastolic (congestive) heart failure: Secondary | ICD-10-CM | POA: Diagnosis not present

## 2022-09-11 DIAGNOSIS — E1151 Type 2 diabetes mellitus with diabetic peripheral angiopathy without gangrene: Secondary | ICD-10-CM | POA: Diagnosis not present

## 2022-09-11 DIAGNOSIS — I87311 Chronic venous hypertension (idiopathic) with ulcer of right lower extremity: Secondary | ICD-10-CM | POA: Diagnosis not present

## 2022-09-11 DIAGNOSIS — I252 Old myocardial infarction: Secondary | ICD-10-CM | POA: Diagnosis not present

## 2022-09-11 DIAGNOSIS — I255 Ischemic cardiomyopathy: Secondary | ICD-10-CM | POA: Diagnosis not present

## 2022-09-11 DIAGNOSIS — I890 Lymphedema, not elsewhere classified: Secondary | ICD-10-CM | POA: Diagnosis not present

## 2022-09-11 DIAGNOSIS — E785 Hyperlipidemia, unspecified: Secondary | ICD-10-CM | POA: Diagnosis not present

## 2022-09-11 DIAGNOSIS — I130 Hypertensive heart and chronic kidney disease with heart failure and stage 1 through stage 4 chronic kidney disease, or unspecified chronic kidney disease: Secondary | ICD-10-CM | POA: Diagnosis not present

## 2022-09-11 DIAGNOSIS — M6281 Muscle weakness (generalized): Secondary | ICD-10-CM | POA: Diagnosis not present

## 2022-09-11 DIAGNOSIS — E1122 Type 2 diabetes mellitus with diabetic chronic kidney disease: Secondary | ICD-10-CM | POA: Diagnosis not present

## 2022-09-11 DIAGNOSIS — M810 Age-related osteoporosis without current pathological fracture: Secondary | ICD-10-CM | POA: Diagnosis not present

## 2022-09-11 DIAGNOSIS — N1831 Chronic kidney disease, stage 3a: Secondary | ICD-10-CM | POA: Diagnosis not present

## 2022-09-11 DIAGNOSIS — L97812 Non-pressure chronic ulcer of other part of right lower leg with fat layer exposed: Secondary | ICD-10-CM | POA: Diagnosis not present

## 2022-09-11 DIAGNOSIS — Z4800 Encounter for change or removal of nonsurgical wound dressing: Secondary | ICD-10-CM | POA: Diagnosis not present

## 2022-09-11 NOTE — Telephone Encounter (Signed)
Verneda Skill, RN at [facility] called stating that Dr. Shelton Silvas was requesting the pt's appt on 9/6 be moved earlier d/t bone exposure at callus.  Reviewed pt's chart, returned call for clarification, no answer, lf vm requesting information on which wound she was referring to since he was just seen in office on 8/2. New wound or same wound that was assessed?

## 2022-09-14 ENCOUNTER — Telehealth: Payer: Self-pay

## 2022-09-14 NOTE — Telephone Encounter (Signed)
Returned call to [employer] at [facility] in regards to request to move pt's appointment up due to L great toe bone exposed. Office requested that I contact pt's daughter to schedule. Pt's daughter was contacted and asked if I would call her dad to schedule due to her not being able to get off work for these appointments. James Dougherty was called and appointments scheduled.

## 2022-09-15 DIAGNOSIS — I509 Heart failure, unspecified: Secondary | ICD-10-CM | POA: Diagnosis not present

## 2022-09-15 DIAGNOSIS — Z794 Long term (current) use of insulin: Secondary | ICD-10-CM | POA: Diagnosis not present

## 2022-09-15 DIAGNOSIS — I252 Old myocardial infarction: Secondary | ICD-10-CM | POA: Diagnosis not present

## 2022-09-15 DIAGNOSIS — E1151 Type 2 diabetes mellitus with diabetic peripheral angiopathy without gangrene: Secondary | ICD-10-CM | POA: Diagnosis not present

## 2022-09-15 DIAGNOSIS — Z8673 Personal history of transient ischemic attack (TIA), and cerebral infarction without residual deficits: Secondary | ICD-10-CM | POA: Diagnosis not present

## 2022-09-15 DIAGNOSIS — E11621 Type 2 diabetes mellitus with foot ulcer: Secondary | ICD-10-CM | POA: Diagnosis not present

## 2022-09-15 DIAGNOSIS — Z79899 Other long term (current) drug therapy: Secondary | ICD-10-CM | POA: Diagnosis not present

## 2022-09-15 DIAGNOSIS — E1140 Type 2 diabetes mellitus with diabetic neuropathy, unspecified: Secondary | ICD-10-CM | POA: Diagnosis not present

## 2022-09-15 DIAGNOSIS — L97529 Non-pressure chronic ulcer of other part of left foot with unspecified severity: Secondary | ICD-10-CM | POA: Diagnosis not present

## 2022-09-15 DIAGNOSIS — I110 Hypertensive heart disease with heart failure: Secondary | ICD-10-CM | POA: Diagnosis not present

## 2022-09-15 DIAGNOSIS — Z882 Allergy status to sulfonamides status: Secondary | ICD-10-CM | POA: Diagnosis not present

## 2022-09-15 DIAGNOSIS — Z792 Long term (current) use of antibiotics: Secondary | ICD-10-CM | POA: Diagnosis not present

## 2022-09-15 DIAGNOSIS — Z89421 Acquired absence of other right toe(s): Secondary | ICD-10-CM | POA: Diagnosis not present

## 2022-09-15 DIAGNOSIS — Z7984 Long term (current) use of oral hypoglycemic drugs: Secondary | ICD-10-CM | POA: Diagnosis not present

## 2022-09-15 DIAGNOSIS — I2510 Atherosclerotic heart disease of native coronary artery without angina pectoris: Secondary | ICD-10-CM | POA: Diagnosis not present

## 2022-09-15 DIAGNOSIS — E785 Hyperlipidemia, unspecified: Secondary | ICD-10-CM | POA: Diagnosis not present

## 2022-09-15 DIAGNOSIS — Z7722 Contact with and (suspected) exposure to environmental tobacco smoke (acute) (chronic): Secondary | ICD-10-CM | POA: Diagnosis not present

## 2022-09-15 DIAGNOSIS — Z87891 Personal history of nicotine dependence: Secondary | ICD-10-CM | POA: Diagnosis not present

## 2022-09-16 ENCOUNTER — Encounter (HOSPITAL_COMMUNITY): Payer: Medicare Other

## 2022-09-16 ENCOUNTER — Inpatient Hospital Stay (HOSPITAL_COMMUNITY): Admission: RE | Admit: 2022-09-16 | Payer: Medicare Other | Source: Ambulatory Visit

## 2022-09-17 DIAGNOSIS — L89323 Pressure ulcer of left buttock, stage 3: Secondary | ICD-10-CM | POA: Diagnosis not present

## 2022-09-17 DIAGNOSIS — M6281 Muscle weakness (generalized): Secondary | ICD-10-CM | POA: Diagnosis not present

## 2022-09-21 ENCOUNTER — Ambulatory Visit (HOSPITAL_COMMUNITY)
Admission: RE | Admit: 2022-09-21 | Discharge: 2022-09-21 | Disposition: A | Discharge status: Home / Self Care | Payer: Medicare Other | Source: Ambulatory Visit | Attending: Surgery | Admitting: Surgery

## 2022-09-21 ENCOUNTER — Ambulatory Visit (INDEPENDENT_AMBULATORY_CARE_PROVIDER_SITE_OTHER)
Admission: RE | Admit: 2022-09-21 | Discharge: 2022-09-21 | Disposition: A | Discharge status: Home / Self Care | Payer: Medicare Other | Source: Ambulatory Visit | Attending: Surgery | Admitting: Surgery

## 2022-09-21 DIAGNOSIS — I739 Peripheral vascular disease, unspecified: Secondary | ICD-10-CM | POA: Insufficient documentation

## 2022-09-21 LAB — VAS US ABI WITH/WO TBI

## 2022-09-22 NOTE — Progress Notes (Signed)
HISTORY AND PHYSICAL   HPI: This is a 72 y.o. male who is here today as an urgent referral with tissue loss and new bone exposure on the left great toe.    James Dougherty is well-known to my practice being followed for bilateral lower extremity PVD, bilateral asymptomatic carotid stenosis.   Carotid stenosis is critical, however this has been placed on hold due to his comorbidities after recent fall, subdural hematoma, midline shift.  He presents today with critical limb ischemia of the left leg with tissue loss of the left great toe.  There is bone exposed. Denies fevers, chills He denies symptoms of TIA, stroke, amaurosis. 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   The pt is on a statin for cholesterol management.    The pt is on an aspirin.    Other AC:  Plavix The pt is on ARB, diuretic for hypertension.  The pt does not have diabetes. Tobacco hx:  former    Past Medical History:  Diagnosis Date   Arthritis    "hands" (12/27/2012)   High cholesterol    Hypertension    Neuropathic pain    PAD (peripheral artery disease) (HCC)    Psoriasis    Stroke (HCC) 1999   "still have some speech problems at times; sometimes forget what I was going to say" (12/27/2012)   Type II diabetes mellitus (HCC)    Ulcer    Left ankle/leg    Past Surgical History:  Procedure Laterality Date   ABDOMINAL AORTAGRAM N/A 03/30/2012   Procedure: ABDOMINAL Ronny Flurry;  Surgeon: Nada Libman, MD;  Location: [facility] CATH LAB;  Service: Cardiovascular;  Laterality: N/A;   ABDOMINAL AORTAGRAM N/A 12/27/2012   Procedure: ABDOMINAL Ronny Flurry;  Surgeon: Nada Libman, MD;  Location: [facility] CATH LAB;  Service: Cardiovascular;  Laterality: N/A;   ABDOMINAL AORTOGRAM W/LOWER EXTREMITY N/A 04/05/2017   Procedure: ABDOMINAL AORTOGRAM W/LOWER EXTREMITY;  Surgeon: Sherren Kerns, MD;  Location: [facility] INVASIVE CV LAB;  Service: Cardiovascular;  Laterality: N/A;   AMPUTATION Right 04/26/2017   Procedure:  AMPUTATION TRANSMETATARSAL RIGHT GREAT TOE;  Surgeon: Larina Earthly, MD;  Location: [facility] OR;  Service: Vascular;  Laterality: Right;   ANGIOPLASTY / STENTING FEMORAL Left 12/27/2012   APPLICATION OF WOUND VAC Right 04/26/2017   Procedure: APPLICATION OF WOUND VAC;  Surgeon: Larina Earthly, MD;  Location: [facility] OR;  Service: Vascular;  Laterality: Right;   FEMORAL ARTERY STENT  03/30/2012   LOWER EXTREMITY ANGIOGRAM  12/27/2012   Procedure: LOWER EXTREMITY ANGIOGRAM;  Surgeon: Nada Libman, MD;  Location: [facility] CATH LAB;  Service: Cardiovascular;;   LOWER EXTREMITY ANGIOGRAPHY N/A 04/19/2018   Procedure: LOWER EXTREMITY ANGIOGRAPHY;  Surgeon: Nada Libman, MD;  Location: [facility] INVASIVE CV LAB;  Service: Cardiovascular;  Laterality: N/A;   PERIPHERAL VASCULAR ATHERECTOMY Right 04/05/2017   Procedure: PERIPHERAL VASCULAR ATHERECTOMY;  Surgeon: Sherren Kerns, MD;  Location: [facility] INVASIVE CV LAB;  Service: Cardiovascular;  Laterality: Right;  superficial femoral   PERIPHERAL VASCULAR BALLOON ANGIOPLASTY  04/19/2018   Procedure: PERIPHERAL VASCULAR BALLOON ANGIOPLASTY;  Surgeon: Nada Libman, MD;  Location: [facility] INVASIVE CV LAB;  Service: Cardiovascular;;   PERIPHERAL VASCULAR INTERVENTION Right 04/05/2017   Procedure: PERIPHERAL VASCULAR INTERVENTION;  Surgeon: Sherren Kerns, MD;  Location: [facility] INVASIVE CV LAB;  Service: Cardiovascular;  Laterality: Right;   Superficial femorl and external iliac   POPLITEAL ARTERY STENT     TONSILLECTOMY AND ADENOIDECTOMY  Allergies  Allergen Reactions   Oxycodone Nausea And Vomiting   Glipizide     Bad headache and blurred vision   Sulfamethoxazole    Trimethoprim    Bactrim [Sulfamethoxazole-Trimethoprim] Itching and Rash    Current Outpatient Medications  Medication Sig Dispense Refill   acetaminophen (TYLENOL) 325 MG tablet Take 2 tablets (650 mg total) by mouth 4 (four) times daily -  with meals and at bedtime. 100 tablet 0   aspirin EC 81 MG tablet  Take 1 tablet (81 mg total) by mouth daily. Swallow whole.     atorvastatin (LIPITOR) 20 MG tablet Take 1 tablet (20 mg total) by mouth at bedtime. Too soon to refill 30 tablet 0   bisoprolol (ZEBETA) 5 MG tablet Take 0.5 tablets (2.5 mg total) by mouth daily. 30 tablet 0   Dulaglutide (TRULICITY) 1.5 MG/0.5ML SOPN Inject 1.5 mg into the skin once a week.     empagliflozin (JARDIANCE) 10 MG TABS tablet Take 1 tablet (10 mg total) by mouth daily. 90 tablet 3   fluticasone (FLONASE) 50 MCG/ACT nasal spray Place 2 sprays into both nostrils daily as needed for allergies. (Patient not taking: Reported on 08/28/2022)     furosemide (LASIX) 20 MG tablet Take 1 tablet (20 mg total) by mouth daily. 30 tablet 0   gabapentin (NEURONTIN) 600 MG tablet Take 1 tablet (600 mg total) by mouth every evening. 30 tablet 0   losartan (COZAAR) 25 MG tablet Take 0.5 tablets (12.5 mg total) by mouth daily. Too soon to refill (Patient not taking: Reported on 06/09/2022) 30 tablet 0   methocarbamol (ROBAXIN) 750 MG tablet Take 1 tablet (750 mg total) by mouth every 8 (eight) hours. 90 tablet 0   NOVOLIN 70/30 RELION (70-30) 100 UNIT/ML injection Inject 16 Units into the skin every evening. (Patient not taking: Reported on 06/09/2022) 5 mL 12   Podiatric Products (GOLD BOND FOOT) CREA Apply 1 application topically 2 (two) times daily as needed (DRY SKIN). DIABETIC CREAM     polyethylene glycol powder (GLYCOLAX/MIRALAX) 17 GM/SCOOP powder Take 17 g by mouth daily. (Patient not taking: Reported on 08/28/2022) 238 g 0   senna-docusate (SENOKOT-S) 8.6-50 MG tablet Take 1 tablet by mouth 2 (two) times daily. (Patient not taking: Reported on 08/28/2022) 60 tablet 0   traZODone (DESYREL) 50 MG tablet Take 0.5 tablets (25 mg total) by mouth at bedtime as needed for sleep. (Patient not taking: Reported on 06/09/2022) 15 tablet 0   triamcinolone cream (KENALOG) 0.1 % Apply 1 Application topically 2 (two) times daily. Mix with OTC eucerin 1:1.  (Patient not taking: Reported on 08/28/2022) 120 g 0   No current facility-administered medications for this visit.    Family History  Problem Relation Age of Onset   Diabetes Sister        Bilateral amputation of lower legs   Diabetes Sister    Heart disease Sister 43       Heart disease before age 68   Hypertension Sister    Heart attack Sister    Hyperlipidemia Sister    Hypertension Father    Hyperlipidemia Father    Hyperlipidemia Mother    Hypertension Mother    Hypertension Daughter     Social History   Socioeconomic History   Marital status: Single    Spouse name: Not on file   Number of children: Not on file   Years of education: Not on file   Highest education level: Not on file  Occupational History   Not on file  Tobacco Use   Smoking status: Former    Current packs/day: 0.00    Average packs/day: 3.0 packs/day for 45.0 years (135.0 ttl pk-yrs)    Types: Cigarettes    Start date: 01/06/1966    Quit date: 01/07/2011    Years since quitting: 11.7    Passive exposure: Never   Smokeless tobacco: Never  Vaping Use   Vaping status: Never Used  Substance and Sexual Activity   Alcohol use: Not Currently    Comment: 12/27/2012 "couple times/yr I might have a drink"   Drug use: No   Sexual activity: Never  Other Topics Concern   Not on file  Social History Narrative   Not on file   Social Determinants of Health   Financial Resource Strain: Not on file  Food Insecurity: Not on file  Transportation Needs: Not on file  Physical Activity: Not on file  Stress: Not on file  Social Connections: Unknown (08/03/2022)   Received from [facility]   Social Network    Social Network: Not on file  Intimate Partner Violence: Unknown (08/03/2022)   Received from [facility]   HITS    Physically Hurt: Not on file    Insult or Talk Down To: Not on file    Threaten Physical Harm: Not on file    Scream or Curse: Not on file     REVIEW OF SYSTEMS:   [X]  denotes  positive finding, [ ]  denotes negative finding Cardiac  Comments:  Chest pain or chest pressure:    Shortness of breath upon exertion: x   Short of breath when lying flat:    Irregular heart rhythm:        Vascular    Pain in calf, thigh, or hip brought on by ambulation: x   Pain in feet at night that wakes you up from your sleep:     Blood clot in your veins:    Leg swelling:         Pulmonary    Oxygen at home:    Productive cough:     Wheezing:         Neurologic    Sudden weakness in arms or legs:     Sudden numbness in arms or legs:     Sudden onset of difficulty speaking or slurred speech:    Temporary loss of vision in one eye:     Problems with dizziness:         Gastrointestinal    Blood in stool:     Vomited blood:         Genitourinary    Burning when urinating:     Blood in urine:        Psychiatric    Major depression:         Hematologic    Bleeding problems:    Problems with blood clotting too easily:        Skin    Rashes or ulcers: x       Constitutional    Fever or chills:      PHYSICAL EXAMINATION:  There were no vitals filed for this visit.   There is no height or weight on file to calculate BMI.   General:  WDWN in NAD; vital signs documented above Gait: Not observed HENT: WNL, normocephalic Pulmonary: normal non-labored breathing , without wheezing Cardiac: regular HR, with carotid bruit on the right Abdomen: soft, NT Skin: with rash in  left groin that is malodorous Vascular Exam/Pulses:  Right Left  Radial 2+ (normal) 2+ (normal)  Femoral Unable to palpate Unable to palpate  DP monophasic Dampened monophasic  PT Brisk biphasic Brisk monophasic  Peroneal monophasic Dampened monophasic   Extremities:      Musculoskeletal: no muscle wasting or atrophy  Neurologic: A&O X 3 Psychiatric:  The pt has Normal affect.   Non-Invasive Vascular Imaging:    Summary:  Right Carotid: Velocities in the right ICA are consistent  with a 80-99%                 stenosis.   Left Carotid: Velocities in the left ICA are consistent with a 40-59%  stenosis.   Vertebrals: Bilateral vertebral arteries demonstrate antegrade flow.   *See table(s) above for measurements and observations.   _________________________________________________________  ABI Findings:  ABI Findings:  +---------+------------------+-----+----------+---------+  Right   Rt Pressure (mmHg)IndexWaveform  Comment    +---------+------------------+-----+----------+---------+  Brachial 135                                         +---------+------------------+-----+----------+---------+  PTA                            monophasic           +---------+------------------+-----+----------+---------+  DP                             monophasic           +---------+------------------+-----+----------+---------+  Great Toe                                 amputated  +---------+------------------+-----+----------+---------+   +---------+------------------+-----+----------+-------+  Left    Lt Pressure (mmHg)IndexWaveform  Comment  +---------+------------------+-----+----------+-------+  Brachial 155                                       +---------+------------------+-----+----------+-------+  PTA                            monophasic         +---------+------------------+-----+----------+-------+  DP                             monophasic         +---------+------------------+-----+----------+-------+  Great Toe                                 bandage  +---------+------------------+-----+----------+-------+    +----------+--------+-----+---------------+--------+--------+  RIGHT    PSV cm/sRatioStenosis       WaveformComments  +----------+--------+-----+---------------+--------+--------+  EIA Prox  332          50-99% stenosis        stent      +----------+--------+-----+---------------+--------+--------+  EIA Mid   327          50-99% stenosis        stent     +----------+--------+-----+---------------+--------+--------+  EIA [id number]  stent     +----------+--------+-----+---------------+--------+--------+  CFA Distal151          30-49% stenosis                  +----------+--------+-----+---------------+--------+--------+  SFA Prox  111                                 stent     +----------+--------+-----+---------------+--------+--------+  SFA Mid                                       stent     +----------+--------+-----+---------------+--------+--------+  POP Prox  40                                            +----------+--------+-----+---------------+--------+--------+  PTA Distal32                                            +----------+--------+-----+---------------+--------+--------+       Right Stent(s):  +---------------+--------+--------+--------+--------+  SFA           PSV cm/sStenosisWaveformComments  +---------------+--------+--------+--------+--------+  Prox to Stent  151                               +---------------+--------+--------+--------+--------+  Proximal Stent 111                               +---------------+--------+--------+--------+--------+  Mid Stent      152                               +---------------+--------+--------+--------+--------+  Distal Stent   115                               +---------------+--------+--------+--------+--------+  Distal to Stent39                                +---------------+--------+--------+--------+--------+    +----------+--------+-----+---------------+----------+--------+  LEFT     PSV cm/sRatioStenosis       Waveform  Comments  +----------+--------+-----+---------------+----------+--------+  CFA Distal160          30-49%  stenosis                    +----------+--------+-----+---------------+----------+--------+  POP Distal75                          monophasic          +----------+--------+-----+---------------+----------+--------+     Left Stent(s):  +---------------+--------+--------+--------+--------+  SFA           PSV cm/sStenosisWaveformComments  +---------------+--------+--------+--------+--------+  Prox to Stent  160                               +---------------+--------+--------+--------+--------+  Proximal Stent 200                               +---------------+--------+--------+--------+--------+  Mid Stent      155                               +---------------+--------+--------+--------+--------+  Distal Stent   80                                +---------------+--------+--------+--------+--------+  Distal to Stent91                                +---------------+--------+--------+--------+--------+     ASSESSMENT/PLAN:: 72 y.o. male who presents to clinic with left lower extremity critical limb ischemia with tissue loss at the left great toe, lateral aspect of the foot.  Patient also has asymptomatic bilateral carotid stenosis - The right side has been critically stenosed since his last visit.  Patient also has right-sided fourth toe amputation wound which appears to be healing nicely.  Unfortunately, James Dougherty's left great toe wound has deteriorated quickly.  He has exposed bone.  The wound has been nonhealing now for 3 weeks.  He would benefit from left lower extremity angiogram with possible intervention in an effort to improve distal perfusion for wound healing.  With his comorbidities, I think he has a high likelihood of having significant disease throughout his left lower extremity.  I am worried he will have significant small vessel disease in the tibials and in the foot.  At the time of his intervention, my plan is to assess his right sided  iliac artery stents.  Recent duplex ultrasound demonstrates elevated velocities concerning for pending occlusion.  Should there be severe stenosis, I would move forward with drug-coated balloon angioplasty for primary assisted patency.  After discussing the risks and benefits of the above, James Dougherty elected to proceed.  Will place asymptomatic critical right ICA stenosis on hold.  I plan to call his daughter regarding the above.     Victorino Sparrow MD

## 2022-09-24 DIAGNOSIS — E785 Hyperlipidemia, unspecified: Secondary | ICD-10-CM | POA: Diagnosis not present

## 2022-09-24 DIAGNOSIS — L97529 Non-pressure chronic ulcer of other part of left foot with unspecified severity: Secondary | ICD-10-CM | POA: Diagnosis not present

## 2022-09-24 DIAGNOSIS — I2510 Atherosclerotic heart disease of native coronary artery without angina pectoris: Secondary | ICD-10-CM | POA: Diagnosis not present

## 2022-09-24 DIAGNOSIS — I252 Old myocardial infarction: Secondary | ICD-10-CM | POA: Diagnosis not present

## 2022-09-24 DIAGNOSIS — Z79899 Other long term (current) drug therapy: Secondary | ICD-10-CM | POA: Diagnosis not present

## 2022-09-24 DIAGNOSIS — Z8673 Personal history of transient ischemic attack (TIA), and cerebral infarction without residual deficits: Secondary | ICD-10-CM | POA: Diagnosis not present

## 2022-09-24 DIAGNOSIS — Z7722 Contact with and (suspected) exposure to environmental tobacco smoke (acute) (chronic): Secondary | ICD-10-CM | POA: Diagnosis not present

## 2022-09-24 DIAGNOSIS — E11621 Type 2 diabetes mellitus with foot ulcer: Secondary | ICD-10-CM | POA: Diagnosis not present

## 2022-09-24 DIAGNOSIS — Z882 Allergy status to sulfonamides status: Secondary | ICD-10-CM | POA: Diagnosis not present

## 2022-09-24 DIAGNOSIS — Z7984 Long term (current) use of oral hypoglycemic drugs: Secondary | ICD-10-CM | POA: Diagnosis not present

## 2022-09-24 DIAGNOSIS — I509 Heart failure, unspecified: Secondary | ICD-10-CM | POA: Diagnosis not present

## 2022-09-24 DIAGNOSIS — E1140 Type 2 diabetes mellitus with diabetic neuropathy, unspecified: Secondary | ICD-10-CM | POA: Diagnosis not present

## 2022-09-24 DIAGNOSIS — Z794 Long term (current) use of insulin: Secondary | ICD-10-CM | POA: Diagnosis not present

## 2022-09-24 DIAGNOSIS — I110 Hypertensive heart disease with heart failure: Secondary | ICD-10-CM | POA: Diagnosis not present

## 2022-09-24 DIAGNOSIS — Z87891 Personal history of nicotine dependence: Secondary | ICD-10-CM | POA: Diagnosis not present

## 2022-09-24 DIAGNOSIS — Z89421 Acquired absence of other right toe(s): Secondary | ICD-10-CM | POA: Diagnosis not present

## 2022-09-24 DIAGNOSIS — Z792 Long term (current) use of antibiotics: Secondary | ICD-10-CM | POA: Diagnosis not present

## 2022-09-24 DIAGNOSIS — E1151 Type 2 diabetes mellitus with diabetic peripheral angiopathy without gangrene: Secondary | ICD-10-CM | POA: Diagnosis not present

## 2022-09-25 ENCOUNTER — Encounter: Payer: Self-pay | Admitting: Vascular Surgery

## 2022-09-25 ENCOUNTER — Other Ambulatory Visit: Payer: Self-pay

## 2022-09-25 ENCOUNTER — Ambulatory Visit: Payer: Medicare Other | Admitting: Vascular Surgery

## 2022-09-25 VITALS — BP 137/83 | HR 74 | Temp 98.2°F | Resp 20 | Ht 68.0 in | Wt 213.0 lb

## 2022-09-25 DIAGNOSIS — I70262 Atherosclerosis of native arteries of extremities with gangrene, left leg: Secondary | ICD-10-CM

## 2022-09-25 DIAGNOSIS — I6521 Occlusion and stenosis of right carotid artery: Secondary | ICD-10-CM

## 2022-09-29 DIAGNOSIS — E1151 Type 2 diabetes mellitus with diabetic peripheral angiopathy without gangrene: Secondary | ICD-10-CM | POA: Diagnosis not present

## 2022-09-29 DIAGNOSIS — I890 Lymphedema, not elsewhere classified: Secondary | ICD-10-CM | POA: Diagnosis not present

## 2022-09-29 DIAGNOSIS — Z9181 History of falling: Secondary | ICD-10-CM | POA: Diagnosis not present

## 2022-09-29 DIAGNOSIS — Z7984 Long term (current) use of oral hypoglycemic drugs: Secondary | ICD-10-CM | POA: Diagnosis not present

## 2022-09-29 DIAGNOSIS — Z7985 Long-term (current) use of injectable non-insulin antidiabetic drugs: Secondary | ICD-10-CM | POA: Diagnosis not present

## 2022-09-29 DIAGNOSIS — I5032 Chronic diastolic (congestive) heart failure: Secondary | ICD-10-CM | POA: Diagnosis not present

## 2022-09-29 DIAGNOSIS — I87311 Chronic venous hypertension (idiopathic) with ulcer of right lower extremity: Secondary | ICD-10-CM | POA: Diagnosis not present

## 2022-09-29 DIAGNOSIS — M810 Age-related osteoporosis without current pathological fracture: Secondary | ICD-10-CM | POA: Diagnosis not present

## 2022-09-29 DIAGNOSIS — I130 Hypertensive heart and chronic kidney disease with heart failure and stage 1 through stage 4 chronic kidney disease, or unspecified chronic kidney disease: Secondary | ICD-10-CM | POA: Diagnosis not present

## 2022-09-29 DIAGNOSIS — E785 Hyperlipidemia, unspecified: Secondary | ICD-10-CM | POA: Diagnosis not present

## 2022-09-29 DIAGNOSIS — E1122 Type 2 diabetes mellitus with diabetic chronic kidney disease: Secondary | ICD-10-CM | POA: Diagnosis not present

## 2022-09-29 DIAGNOSIS — I252 Old myocardial infarction: Secondary | ICD-10-CM | POA: Diagnosis not present

## 2022-09-29 DIAGNOSIS — Z4800 Encounter for change or removal of nonsurgical wound dressing: Secondary | ICD-10-CM | POA: Diagnosis not present

## 2022-09-29 DIAGNOSIS — Z794 Long term (current) use of insulin: Secondary | ICD-10-CM | POA: Diagnosis not present

## 2022-09-29 DIAGNOSIS — M6281 Muscle weakness (generalized): Secondary | ICD-10-CM | POA: Diagnosis not present

## 2022-09-29 DIAGNOSIS — N1831 Chronic kidney disease, stage 3a: Secondary | ICD-10-CM | POA: Diagnosis not present

## 2022-09-29 DIAGNOSIS — L97529 Non-pressure chronic ulcer of other part of left foot with unspecified severity: Secondary | ICD-10-CM | POA: Diagnosis not present

## 2022-09-29 DIAGNOSIS — E1142 Type 2 diabetes mellitus with diabetic polyneuropathy: Secondary | ICD-10-CM | POA: Diagnosis not present

## 2022-09-29 DIAGNOSIS — I255 Ischemic cardiomyopathy: Secondary | ICD-10-CM | POA: Diagnosis not present

## 2022-09-30 ENCOUNTER — Ambulatory Visit (HOSPITAL_COMMUNITY)
Admission: RE | Admit: 2022-09-30 | Discharge: 2022-09-30 | Disposition: A | Discharge status: Home / Self Care | Payer: Medicare Other | Attending: Vascular Surgery | Admitting: Vascular Surgery

## 2022-09-30 ENCOUNTER — Encounter (HOSPITAL_COMMUNITY)
Admission: RE | Disposition: A | Discharge status: Home / Self Care | Payer: Self-pay | Source: Home / Self Care | Attending: Vascular Surgery

## 2022-09-30 ENCOUNTER — Other Ambulatory Visit: Payer: Self-pay

## 2022-09-30 DIAGNOSIS — Z8249 Family history of ischemic heart disease and other diseases of the circulatory system: Secondary | ICD-10-CM | POA: Diagnosis not present

## 2022-09-30 DIAGNOSIS — I10 Essential (primary) hypertension: Secondary | ICD-10-CM | POA: Diagnosis not present

## 2022-09-30 DIAGNOSIS — I70245 Atherosclerosis of native arteries of left leg with ulceration of other part of foot: Secondary | ICD-10-CM | POA: Diagnosis present

## 2022-09-30 DIAGNOSIS — Z89411 Acquired absence of right great toe: Secondary | ICD-10-CM | POA: Insufficient documentation

## 2022-09-30 DIAGNOSIS — L97526 Non-pressure chronic ulcer of other part of left foot with bone involvement without evidence of necrosis: Secondary | ICD-10-CM | POA: Insufficient documentation

## 2022-09-30 DIAGNOSIS — Z79899 Other long term (current) drug therapy: Secondary | ICD-10-CM | POA: Diagnosis not present

## 2022-09-30 DIAGNOSIS — E1151 Type 2 diabetes mellitus with diabetic peripheral angiopathy without gangrene: Secondary | ICD-10-CM | POA: Insufficient documentation

## 2022-09-30 DIAGNOSIS — Z87891 Personal history of nicotine dependence: Secondary | ICD-10-CM | POA: Insufficient documentation

## 2022-09-30 DIAGNOSIS — Z833 Family history of diabetes mellitus: Secondary | ICD-10-CM | POA: Diagnosis not present

## 2022-09-30 DIAGNOSIS — E11621 Type 2 diabetes mellitus with foot ulcer: Secondary | ICD-10-CM | POA: Insufficient documentation

## 2022-09-30 DIAGNOSIS — I6523 Occlusion and stenosis of bilateral carotid arteries: Secondary | ICD-10-CM | POA: Diagnosis not present

## 2022-09-30 DIAGNOSIS — I70262 Atherosclerosis of native arteries of extremities with gangrene, left leg: Secondary | ICD-10-CM

## 2022-09-30 HISTORY — PX: ABDOMINAL AORTOGRAM W/LOWER EXTREMITY: CATH118223

## 2022-09-30 HISTORY — PX: PERIPHERAL VASCULAR BALLOON ANGIOPLASTY: CATH118281

## 2022-09-30 LAB — POCT I-STAT, CHEM 8
BUN: 17 mg/dL (ref 8–23)
Calcium, Ion: 1.19 mmol/L (ref 1.15–1.40)
Chloride: 98 mmol/L (ref 98–111)
Creatinine, Ser: 1.6 mg/dL — ABNORMAL HIGH (ref 0.61–1.24)
Glucose, Bld: 240 mg/dL — ABNORMAL HIGH (ref 70–99)
HCT: 50 % (ref 39.0–52.0)
Hemoglobin: 17 g/dL (ref 13.0–17.0)
Potassium: 4.4 mmol/L (ref 3.5–5.1)
Sodium: 140 mmol/L (ref 135–145)
TCO2: 30 mmol/L (ref 22–32)

## 2022-09-30 LAB — GLUCOSE, CAPILLARY: Glucose-Capillary: 162 mg/dL — ABNORMAL HIGH (ref 70–99)

## 2022-09-30 SURGERY — ABDOMINAL AORTOGRAM W/LOWER EXTREMITY
Anesthesia: LOCAL

## 2022-09-30 MED ORDER — ASPIRIN 81 MG PO CHEW
CHEWABLE_TABLET | ORAL | Status: DC | PRN
  Administered 2022-09-30: 81 mg via ORAL

## 2022-09-30 MED ORDER — HEPARIN (PORCINE) IN NACL 1000-0.9 UT/500ML-% IV SOLN
INTRAVENOUS | Status: DC | PRN
  Administered 2022-09-30 (×2): 500 mL

## 2022-09-30 MED ORDER — FENTANYL CITRATE (PF) 100 MCG/2ML IJ SOLN
INTRAMUSCULAR | Status: AC
  Filled 2022-09-30: qty 2

## 2022-09-30 MED ORDER — LIDOCAINE HCL (PF) 1 % IJ SOLN
INTRAMUSCULAR | Status: DC | PRN
  Administered 2022-09-30: 15 mL

## 2022-09-30 MED ORDER — LIDOCAINE HCL (PF) 1 % IJ SOLN
INTRAMUSCULAR | Status: AC
  Filled 2022-09-30: qty 30

## 2022-09-30 MED ORDER — SODIUM CHLORIDE 0.9% FLUSH: 3.0000 mL | INTRAVENOUS | Status: DC | PRN

## 2022-09-30 MED ORDER — CLOPIDOGREL BISULFATE 75 MG PO TABS: 75.0000 mg | ORAL_TABLET | Freq: Every day | ORAL | Status: DC

## 2022-09-30 MED ORDER — HEPARIN SODIUM (PORCINE) 1000 UNIT/ML IJ SOLN
INTRAMUSCULAR | Status: AC
  Filled 2022-09-30: qty 10

## 2022-09-30 MED ORDER — CLOPIDOGREL BISULFATE 75 MG PO TABS
75.0000 mg | ORAL_TABLET | Freq: Every day | ORAL | 11 refills | Status: DC
Start: 2022-09-30 — End: 2023-02-13

## 2022-09-30 MED ORDER — HYDRALAZINE HCL 20 MG/ML IJ SOLN: 5.0000 mg | INTRAMUSCULAR | Status: DC | PRN

## 2022-09-30 MED ORDER — ASPIRIN 81 MG PO CHEW
CHEWABLE_TABLET | ORAL | Status: AC
  Filled 2022-09-30: qty 1

## 2022-09-30 MED ORDER — FENTANYL CITRATE (PF) 100 MCG/2ML IJ SOLN
INTRAMUSCULAR | Status: DC | PRN
  Administered 2022-09-30: 50 ug via INTRAVENOUS

## 2022-09-30 MED ORDER — SODIUM CHLORIDE 0.9 % IV SOLN: 250.0000 mL | INTRAVENOUS | Status: DC | PRN

## 2022-09-30 MED ORDER — IODIXANOL 320 MG/ML IV SOLN
INTRAVENOUS | Status: DC | PRN
  Administered 2022-09-30: 100 mL via INTRA_ARTERIAL

## 2022-09-30 MED ORDER — LABETALOL HCL 5 MG/ML IV SOLN: 10.0000 mg | INTRAVENOUS | Status: DC | PRN

## 2022-09-30 MED ORDER — ACETAMINOPHEN 325 MG PO TABS: 650.0000 mg | ORAL_TABLET | ORAL | Status: DC | PRN

## 2022-09-30 MED ORDER — ONDANSETRON HCL 4 MG/2ML IJ SOLN: 4.0000 mg | Freq: Four times a day (QID) | INTRAMUSCULAR | Status: DC | PRN

## 2022-09-30 MED ORDER — MIDAZOLAM HCL 2 MG/2ML IJ SOLN
INTRAMUSCULAR | Status: DC | PRN
  Administered 2022-09-30: 1 mg via INTRAVENOUS

## 2022-09-30 MED ORDER — CLOPIDOGREL BISULFATE 300 MG PO TABS
ORAL_TABLET | ORAL | Status: AC
  Filled 2022-09-30: qty 1

## 2022-09-30 MED ORDER — SODIUM CHLORIDE 0.9 % WEIGHT BASED INFUSION: 1.0000 mL/kg/h | INTRAVENOUS | Status: DC

## 2022-09-30 MED ORDER — HEPARIN SODIUM (PORCINE) 1000 UNIT/ML IJ SOLN
INTRAMUSCULAR | Status: DC | PRN
  Administered 2022-09-30: 10000 [IU] via INTRAVENOUS
  Administered 2022-09-30: 3000 [IU] via INTRAVENOUS

## 2022-09-30 MED ORDER — SODIUM CHLORIDE 0.9 % IV SOLN: INTRAVENOUS | Status: DC

## 2022-09-30 MED ORDER — SODIUM CHLORIDE 0.9% FLUSH: 3.0000 mL | Freq: Two times a day (BID) | INTRAVENOUS | Status: DC

## 2022-09-30 MED ORDER — MIDAZOLAM HCL 2 MG/2ML IJ SOLN
INTRAMUSCULAR | Status: AC
  Filled 2022-09-30: qty 2

## 2022-09-30 MED ORDER — CLOPIDOGREL BISULFATE 300 MG PO TABS
ORAL_TABLET | ORAL | Status: DC | PRN
  Administered 2022-09-30: 300 mg via ORAL

## 2022-09-30 SURGICAL SUPPLY — 23 items
BALLN STERLING OTW 3X60X150 (BALLOONS) ×2
BALLOON STERLING OTW 3X60X150 (BALLOONS) IMPLANT
CATH OMNI FLUSH 5F 65CM (CATHETERS) IMPLANT
CATH QUICKCROSS .035X135CM (MICROCATHETER) IMPLANT
CATH SHOCKWAVE M5 3.5X60 (CATHETERS) IMPLANT
DCB RANGER 5.0X60 135 (BALLOONS) IMPLANT
DCB RANGER 6.0X40 135 (BALLOONS) IMPLANT
DEVICE CLOSURE MYNXGRIP 6/7F (Vascular Products) IMPLANT
GLIDEWIRE ADV .035X260CM (WIRE) IMPLANT
KIT ENCORE 26 ADVANTAGE (KITS) IMPLANT
KIT MICROPUNCTURE NIT STIFF (SHEATH) IMPLANT
KIT SYRINGE INJ CVI SPIKEX1 (MISCELLANEOUS) IMPLANT
RANGER DCB 5.0X60 135 (BALLOONS) ×2
RANGER DCB 6.0X40 135 (BALLOONS) ×2
SET ATX-X65L (MISCELLANEOUS) IMPLANT
SHEATH CATAPULT 6FR 60 (SHEATH) IMPLANT
SHEATH PINNACLE 5F 10CM (SHEATH) IMPLANT
SHEATH PINNACLE 6F 10CM (SHEATH) IMPLANT
SHEATH PROBE COVER 6X72 (BAG) IMPLANT
TRAY PV CATH (CUSTOM PROCEDURE TRAY) ×2 IMPLANT
WIRE BENTSON .035X145CM (WIRE) IMPLANT
WIRE G V18X300CM (WIRE) IMPLANT
WIRE SPARTACORE .014X300CM (WIRE) IMPLANT

## 2022-09-30 NOTE — Progress Notes (Signed)
Client up and walked and tolerated well; right groin stable, no bleeding or hematoma 

## 2022-09-30 NOTE — Progress Notes (Signed)
James Dougherty notified of glucose 240. No orders at this time.

## 2022-09-30 NOTE — Op Note (Signed)
Patient name: James Dougherty MRN: [id number] DOB: [birth year] Sex: male  09/30/2022 Pre-operative Diagnosis: Left lower extremity critical limb ischemia with tissue loss of the left great toe Post-operative diagnosis:  Same Surgeon:  James Sparrow, MD Procedure Performed: 1.  Ultrasound-guided micropuncture access of the right common femoral artery in retrograde fashion 2.  Aortogram 3.  Second-order cannulation, left lower extremity angiogram 4.  Third order cannulation, left lower extremity angiogram 5.  Drug-coated balloon angioplasty superficial femoral artery 5 x 60 mm 6.  Shockwave balloon lithotripsy 3.5 x 60 mm popliteal artery 7.  Balloon angioplasty  3 x 60 mm tibioperoneal trunk Drug-coated balloon angioplasty 6 x 40 mm right external iliac artery 8.  Device assisted closure-Mynx   Indications: Patient is a 72 year old male with longstanding history of bilateral peripheral arterial disease status post stenting in multiple vascular beds.  He presented to my office with left lower extremity tissue loss of the great toe with exposed bone.  After discussing the risks and benefits of left lower extremity angiogram with possible intervention in an effort to define and improve distal perfusion for wound healing, James Dougherty elected to proceed  Findings:  Aortogram: No flow-limiting stenosis in the aortoiliac segments bilaterally except for greater than 70% in-stent restenosis of the right external iliac artery On the left: Widely patent common femoral artery, profunda.  The superficial femoral artery was lined with prior stenting with in-stent restenosis greater than 70% at the mid thigh.  Distally, the popliteal artery was patent with 2 areas of greater than 65% stenosis.  There was greater than 70% stenosis at the ostia of the tibioperoneal trunk.  The anterior tibial artery occluded proximally, reconstituting as the dorsalis pedis in the foot from peroneal collaterals.  The peroneal and  posterior tibial artery provided runoff to the foot filling plantar arteries.  Microvascular disease present.  Pedal arch did not appear intact.   Procedure:  The patient was identified in the holding area and taken to room 8.  The patient was then placed supine on the table and prepped and draped in the usual sterile fashion.  A time out was called.  Ultrasound was used to evaluate the right common femoral artery.  It was patent .  A digital ultrasound image was acquired.  A micropuncture needle was used to access the right common femoral artery under ultrasound guidance.  An 018 wire was advanced without resistance and a micropuncture sheath was placed.  The 018 wire was removed and a benson wire was placed.  The micropuncture sheath was exchanged for a 5 french sheath.  An omniflush catheter was advanced over the wire to the level of L-1.  An abdominal angiogram was obtained.  Next, using the omniflush catheter and a benson wire, the aortic bifurcation was crossed and the catheter was placed into theleft external iliac artery and left runoff was obtained.   See results above.  I elected to intervene on the superficial femoral artery, popliteal artery, tibioperoneal trunk lesions.  A 6 x 60 mm sheath was brought to the field and parked in the left superficial femoral artery.  The patient was heparinized.  Next, a 5 x 60 mm drug-coated balloon was brought to the field and laid across the superficial femoral artery lesion.  The balloon was inflated for period 3 minutes, with follow-up angiography demonstrated excellent result with resolution of flow-limiting stenosis.  A drug-coated balloon was used to prevent further in-stent restenosis.  Next, my attention turned to  the popliteal artery.  Popliteal artery demonstrated significant eccentric disease.  I originally measured this is roughly 50%, so I moved to the tibioperoneal trunk which measured greater than 70%.  A 3 x 60 mm balloon was brought onto the field  and parked across the ostia of the tibioperoneal trunk.  This is inflated for 3 minutes with follow-up angiography demonstrated excellent result with resolution of swelling stenosis to less than 30%.  Further imaging and post imaging processing were used which defined to the popliteal artery stenosis as greater than 65%.  I elected to treat this lesion.  I was very concerned that there would be dissection due to the eccentric calcific disease, and therefore I elected to used shockwave balloon lithotripsy 3.5 x 60 mm.  2 lesions were treated.  A total of 3 treatments per lesion.  Follow-up angiography demonstrated excellent result with resolution of flow-limiting stenosis.  I then moved to the right sided external neck artery which demonstrated stenosis greater than 70%.  I elected to use a 6 x 40 mm drug-coated balloon to prevent further restenosis.  Follow-up angiography demonstrated excellent result with resolution of the stenotic lesion.   Impression: Successful drug-coated balloon angioplasty of in-stent restenosis appreciated in the right external iliac artery, left superficial femoral artery.  Successful balloon lithotripsy of the left popliteal artery.  Successful balloon angioplasty of the left tibioperoneal trunk.  The patient asked to return to [facility] amputation as Dr. Gardiner Dougherty has performed them in the past.   My plan is to see him in 1 month's time with repeat ABI. Right iliac artery duplex ultrasound and left leg arterial duplex.  I discussed hte above with his daughter including restarting DAPT, cleared by neurosurgery.    James Sparrow MD [facility] Office: [phone]

## 2022-09-30 NOTE — H&P (Addendum)
HISTORY AND PHYSICAL    Patient seen and examined in preop holding.  No complaints. No changes to medication history or physical exam since last seen in clinic. After discussing the risks and benefits of LLE angiogram with possible intervention for CLI with tissue loss at the toe, [name] elected to proceed.   He and his daughter are aware that I plan to be aggressive from an endovascular standpoint as he is a poor open surgical candidate.   Victorino Sparrow MD  HPI: This is a 72 y.o. male who is here today as an urgent referral with tissue loss and new bone exposure on the left great toe.    Michelangelo is well-known to my practice being followed for bilateral lower extremity PVD, bilateral asymptomatic carotid stenosis.   Carotid stenosis is critical, however this has been placed on hold due to his comorbidities after recent fall, subdural hematoma, midline shift.  He presents today with critical limb ischemia of the left leg with tissue loss of the left great toe.  There is bone exposed. Denies fevers, chills He denies symptoms of TIA, stroke, amaurosis. 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   The pt is on a statin for cholesterol management.    The pt is on an aspirin.    Other AC:  Plavix The pt is on ARB, diuretic for hypertension.  The pt does not have diabetes. Tobacco hx:  former    Past Medical History:  Diagnosis Date   Arthritis    "hands" (12/27/2012)   High cholesterol    Hypertension    Neuropathic pain    PAD (peripheral artery disease) (HCC)    Psoriasis    Stroke (HCC) 1999   "still have some speech problems at times; sometimes forget what I was going to say" (12/27/2012)   Type II diabetes mellitus (HCC)    Ulcer    Left ankle/leg    Past Surgical History:  Procedure Laterality Date   ABDOMINAL AORTAGRAM N/A 03/30/2012   Procedure: ABDOMINAL Ronny Flurry;  Surgeon: Nada Libman, MD;  Location: [facility] CATH LAB;  Service:  Cardiovascular;  Laterality: N/A;   ABDOMINAL AORTAGRAM N/A 12/27/2012   Procedure: ABDOMINAL Ronny Flurry;  Surgeon: Nada Libman, MD;  Location: [facility] CATH LAB;  Service: Cardiovascular;  Laterality: N/A;   ABDOMINAL AORTOGRAM W/LOWER EXTREMITY N/A 04/05/2017   Procedure: ABDOMINAL AORTOGRAM W/LOWER EXTREMITY;  Surgeon: Sherren Kerns, MD;  Location: [facility] INVASIVE CV LAB;  Service: Cardiovascular;  Laterality: N/A;   AMPUTATION Right 04/26/2017   Procedure: AMPUTATION TRANSMETATARSAL RIGHT GREAT TOE;  Surgeon: Larina Earthly, MD;  Location: [facility] OR;  Service: Vascular;  Laterality: Right;   ANGIOPLASTY / STENTING FEMORAL Left 12/27/2012   APPLICATION OF WOUND VAC Right 04/26/2017   Procedure: APPLICATION OF WOUND VAC;  Surgeon: Larina Earthly, MD;  Location: [facility] OR;  Service: Vascular;  Laterality: Right;   FEMORAL ARTERY STENT  03/30/2012   LOWER EXTREMITY ANGIOGRAM  12/27/2012   Procedure: LOWER EXTREMITY ANGIOGRAM;  Surgeon: Nada Libman, MD;  Location: [facility] CATH LAB;  Service: Cardiovascular;;   LOWER EXTREMITY ANGIOGRAPHY N/A 04/19/2018   Procedure: LOWER EXTREMITY ANGIOGRAPHY;  Surgeon: Nada Libman, MD;  Location: [facility] INVASIVE CV LAB;  Service: Cardiovascular;  Laterality: N/A;   PERIPHERAL VASCULAR ATHERECTOMY Right 04/05/2017   Procedure: PERIPHERAL VASCULAR ATHERECTOMY;  Surgeon: Sherren Kerns, MD;  Location: [facility] INVASIVE CV LAB;  Service: Cardiovascular;  Laterality: Right;  superficial femoral  PERIPHERAL VASCULAR BALLOON ANGIOPLASTY  04/19/2018   Procedure: PERIPHERAL VASCULAR BALLOON ANGIOPLASTY;  Surgeon: Nada Libman, MD;  Location: [facility] INVASIVE CV LAB;  Service: Cardiovascular;;   PERIPHERAL VASCULAR INTERVENTION Right 04/05/2017   Procedure: PERIPHERAL VASCULAR INTERVENTION;  Surgeon: Sherren Kerns, MD;  Location: [facility] INVASIVE CV LAB;  Service: Cardiovascular;  Laterality: Right;   Superficial femorl and external iliac   POPLITEAL ARTERY STENT     TONSILLECTOMY AND ADENOIDECTOMY       Allergies  Allergen Reactions   Oxycodone Nausea And Vomiting   Glipizide     Bad headache and blurred vision   Other     Soft silicone dressing caused wound to get worse   Bactrim [Sulfamethoxazole-Trimethoprim] Itching and Rash    Current Facility-Administered Medications  Medication Dose Route Frequency Provider Last Rate Last Admin   0.9 %  sodium chloride infusion   Intravenous Continuous Victorino Sparrow, MD 100 mL/hr at 09/30/22 0749 New Bag at 09/30/22 0749    Family History  Problem Relation Age of Onset   Diabetes Sister        Bilateral amputation of lower legs   Diabetes Sister    Heart disease Sister 82       Heart disease before age 49   Hypertension Sister    Heart attack Sister    Hyperlipidemia Sister    Hypertension Father    Hyperlipidemia Father    Hyperlipidemia Mother    Hypertension Mother    Hypertension Daughter     Social History   Socioeconomic History   Marital status: Single    Spouse name: Not on file   Number of children: Not on file   Years of education: Not on file   Highest education level: Not on file  Occupational History   Not on file  Tobacco Use   Smoking status: Former    Current packs/day: 0.00    Average packs/day: 3.0 packs/day for 45.0 years (135.0 ttl pk-yrs)    Types: Cigarettes    Start date: 01/06/1966    Quit date: 01/07/2011    Years since quitting: 11.7    Passive exposure: Never   Smokeless tobacco: Never  Vaping Use   Vaping status: Never Used  Substance and Sexual Activity   Alcohol use: Not Currently    Comment: 12/27/2012 "couple times/yr I might have a drink"   Drug use: No   Sexual activity: Never  Other Topics Concern   Not on file  Social History Narrative   Not on file   Social Determinants of Health   Financial Resource Strain: Not on file  Food Insecurity: Not on file  Transportation Needs: Not on file  Physical Activity: Not on file  Stress: Not on file  Social Connections:  Unknown (08/03/2022)   Received from [facility]   Social Network    Social Network: Not on file  Intimate Partner Violence: Unknown (08/03/2022)   Received from [facility]   HITS    Physically Hurt: Not on file    Insult or Talk Down To: Not on file    Threaten Physical Harm: Not on file    Scream or Curse: Not on file     REVIEW OF SYSTEMS:   [X]  denotes positive finding, [ ]  denotes negative finding Cardiac  Comments:  Chest pain or chest pressure:    Shortness of breath upon exertion: x   Short of breath when lying flat:    Irregular heart rhythm:  Vascular    Pain in calf, thigh, or hip brought on by ambulation: x   Pain in feet at night that wakes you up from your sleep:     Blood clot in your veins:    Leg swelling:         Pulmonary    Oxygen at home:    Productive cough:     Wheezing:         Neurologic    Sudden weakness in arms or legs:     Sudden numbness in arms or legs:     Sudden onset of difficulty speaking or slurred speech:    Temporary loss of vision in one eye:     Problems with dizziness:         Gastrointestinal    Blood in stool:     Vomited blood:         Genitourinary    Burning when urinating:     Blood in urine:        Psychiatric    Major depression:         Hematologic    Bleeding problems:    Problems with blood clotting too easily:        Skin    Rashes or ulcers: x       Constitutional    Fever or chills:      PHYSICAL EXAMINATION:  Today's Vitals   09/30/22 0741 09/30/22 0752  BP:  130/87  Pulse:  69  Resp:  16  Temp:  97.7 F (36.5 C)  TempSrc:  Oral  SpO2:  98%  Weight:  96.6 kg  Height:  5\' 8"  (1.727 m)  PainSc: 0-No pain      Body mass index is 32.38 kg/m.   General:  WDWN in NAD; vital signs documented above Gait: Not observed HENT: WNL, normocephalic Pulmonary: normal non-labored breathing , without wheezing Cardiac: regular HR, with carotid bruit on the right Abdomen: soft,  NT Skin: with rash in left groin that is malodorous Vascular Exam/Pulses:  Right Left  Radial 2+ (normal) 2+ (normal)  Femoral Unable to palpate Unable to palpate  DP monophasic Dampened monophasic  PT Brisk biphasic Brisk monophasic  Peroneal monophasic Dampened monophasic   Extremities:      Musculoskeletal: no muscle wasting or atrophy  Neurologic: A&O X 3 Psychiatric:  The pt has Normal affect.   Non-Invasive Vascular Imaging:    Summary:  Right Carotid: Velocities in the right ICA are consistent with a 80-99%                 stenosis.   Left Carotid: Velocities in the left ICA are consistent with a 40-59%  stenosis.   Vertebrals: Bilateral vertebral arteries demonstrate antegrade flow.   *See table(s) above for measurements and observations.   _________________________________________________________  ABI Findings:  ABI Findings:  +---------+------------------+-----+----------+---------+  Right   Rt Pressure (mmHg)IndexWaveform  Comment    +---------+------------------+-----+----------+---------+  Brachial 135                                         +---------+------------------+-----+----------+---------+  PTA                            monophasic           +---------+------------------+-----+----------+---------+  DP  monophasic           +---------+------------------+-----+----------+---------+  Great Toe                                 amputated  +---------+------------------+-----+----------+---------+   +---------+------------------+-----+----------+-------+  Left    Lt Pressure (mmHg)IndexWaveform  Comment  +---------+------------------+-----+----------+-------+  Brachial 155                                       +---------+------------------+-----+----------+-------+  PTA                            monophasic         +---------+------------------+-----+----------+-------+   DP                             monophasic         +---------+------------------+-----+----------+-------+  Great Toe                                 bandage  +---------+------------------+-----+----------+-------+    +----------+--------+-----+---------------+--------+--------+  RIGHT    PSV cm/sRatioStenosis       WaveformComments  +----------+--------+-----+---------------+--------+--------+  EIA Prox  332          50-99% stenosis        stent     +----------+--------+-----+---------------+--------+--------+  EIA Mid   327          50-99% stenosis        stent     +----------+--------+-----+---------------+--------+--------+  EIA Distal181                                 stent     +----------+--------+-----+---------------+--------+--------+  CFA Distal151          30-49% stenosis                  +----------+--------+-----+---------------+--------+--------+  SFA Prox  111                                 stent     +----------+--------+-----+---------------+--------+--------+  SFA Mid                                       stent     +----------+--------+-----+---------------+--------+--------+  POP Prox  40                                            +----------+--------+-----+---------------+--------+--------+  PTA Distal32                                            +----------+--------+-----+---------------+--------+--------+       Right Stent(s):  +---------------+--------+--------+--------+--------+  SFA           PSV cm/sStenosisWaveformComments  +---------------+--------+--------+--------+--------+  Prox to Stent  151                               +---------------+--------+--------+--------+--------+  Proximal Stent 111                               +---------------+--------+--------+--------+--------+  Mid Stent      152                                +---------------+--------+--------+--------+--------+  Distal Stent   115                               +---------------+--------+--------+--------+--------+  Distal to Stent39                                +---------------+--------+--------+--------+--------+    +----------+--------+-----+---------------+----------+--------+  LEFT     PSV cm/sRatioStenosis       Waveform  Comments  +----------+--------+-----+---------------+----------+--------+  CFA Distal160          30-49% stenosis                    +----------+--------+-----+---------------+----------+--------+  POP Distal75                          monophasic          +----------+--------+-----+---------------+----------+--------+     Left Stent(s):  +---------------+--------+--------+--------+--------+  SFA           PSV cm/sStenosisWaveformComments  +---------------+--------+--------+--------+--------+  Prox to Stent  160                               +---------------+--------+--------+--------+--------+  Proximal Stent 200                               +---------------+--------+--------+--------+--------+  Mid Stent      155                               +---------------+--------+--------+--------+--------+  Distal Stent   80                                +---------------+--------+--------+--------+--------+  Distal to Stent91                                +---------------+--------+--------+--------+--------+     ASSESSMENT/PLAN:: 72 y.o. male who presents to clinic with left lower extremity critical limb ischemia with tissue loss at the left great toe, lateral aspect of the foot.  Patient also has asymptomatic bilateral carotid stenosis - The right side has been critically stenosed since his last visit.  Patient also has right-sided fourth toe amputation wound which appears to be healing nicely.  Unfortunately, Eural's left great toe wound has  deteriorated quickly.  He has exposed bone.  The wound has been nonhealing now for 3 weeks.  He would benefit from left lower extremity angiogram with possible intervention in an effort to improve distal perfusion for wound healing.  With his comorbidities, I think he has a high likelihood of having significant disease throughout his left lower extremity.  I am worried he will have significant small vessel disease  in the tibials and in the foot.  At the time of his intervention, my plan is to assess his right sided iliac artery stents.  Recent duplex ultrasound demonstrates elevated velocities concerning for pending occlusion.  Should there be severe stenosis, I would move forward with drug-coated balloon angioplasty for primary assisted patency.  After discussing the risks and benefits of the above, Ashley elected to proceed.  Will place asymptomatic critical right ICA stenosis on hold.  I plan to call his daughter regarding the above.     Victorino Sparrow MD

## 2022-10-01 ENCOUNTER — Encounter (HOSPITAL_COMMUNITY): Payer: Self-pay | Admitting: Vascular Surgery

## 2022-10-02 ENCOUNTER — Encounter (HOSPITAL_COMMUNITY): Payer: Medicare Other

## 2022-10-02 DIAGNOSIS — L97529 Non-pressure chronic ulcer of other part of left foot with unspecified severity: Secondary | ICD-10-CM | POA: Diagnosis not present

## 2022-10-02 DIAGNOSIS — E1142 Type 2 diabetes mellitus with diabetic polyneuropathy: Secondary | ICD-10-CM | POA: Diagnosis not present

## 2022-10-02 DIAGNOSIS — E1122 Type 2 diabetes mellitus with diabetic chronic kidney disease: Secondary | ICD-10-CM | POA: Diagnosis not present

## 2022-10-02 DIAGNOSIS — Z9181 History of falling: Secondary | ICD-10-CM | POA: Diagnosis not present

## 2022-10-02 DIAGNOSIS — I252 Old myocardial infarction: Secondary | ICD-10-CM | POA: Diagnosis not present

## 2022-10-02 DIAGNOSIS — Z7985 Long-term (current) use of injectable non-insulin antidiabetic drugs: Secondary | ICD-10-CM | POA: Diagnosis not present

## 2022-10-02 DIAGNOSIS — N1831 Chronic kidney disease, stage 3a: Secondary | ICD-10-CM | POA: Diagnosis not present

## 2022-10-02 DIAGNOSIS — E785 Hyperlipidemia, unspecified: Secondary | ICD-10-CM | POA: Diagnosis not present

## 2022-10-02 DIAGNOSIS — Z4800 Encounter for change or removal of nonsurgical wound dressing: Secondary | ICD-10-CM | POA: Diagnosis not present

## 2022-10-02 DIAGNOSIS — I255 Ischemic cardiomyopathy: Secondary | ICD-10-CM | POA: Diagnosis not present

## 2022-10-02 DIAGNOSIS — I87311 Chronic venous hypertension (idiopathic) with ulcer of right lower extremity: Secondary | ICD-10-CM | POA: Diagnosis not present

## 2022-10-02 DIAGNOSIS — Z7984 Long term (current) use of oral hypoglycemic drugs: Secondary | ICD-10-CM | POA: Diagnosis not present

## 2022-10-02 DIAGNOSIS — I890 Lymphedema, not elsewhere classified: Secondary | ICD-10-CM | POA: Diagnosis not present

## 2022-10-02 DIAGNOSIS — I130 Hypertensive heart and chronic kidney disease with heart failure and stage 1 through stage 4 chronic kidney disease, or unspecified chronic kidney disease: Secondary | ICD-10-CM | POA: Diagnosis not present

## 2022-10-02 DIAGNOSIS — M6281 Muscle weakness (generalized): Secondary | ICD-10-CM | POA: Diagnosis not present

## 2022-10-02 DIAGNOSIS — I5032 Chronic diastolic (congestive) heart failure: Secondary | ICD-10-CM | POA: Diagnosis not present

## 2022-10-02 DIAGNOSIS — E1151 Type 2 diabetes mellitus with diabetic peripheral angiopathy without gangrene: Secondary | ICD-10-CM | POA: Diagnosis not present

## 2022-10-02 DIAGNOSIS — M810 Age-related osteoporosis without current pathological fracture: Secondary | ICD-10-CM | POA: Diagnosis not present

## 2022-10-02 DIAGNOSIS — Z794 Long term (current) use of insulin: Secondary | ICD-10-CM | POA: Diagnosis not present

## 2022-10-09 ENCOUNTER — Ambulatory Visit: Payer: Medicare Other | Admitting: Vascular Surgery

## 2022-10-10 DIAGNOSIS — E785 Hyperlipidemia, unspecified: Secondary | ICD-10-CM | POA: Diagnosis not present

## 2022-10-10 DIAGNOSIS — L97529 Non-pressure chronic ulcer of other part of left foot with unspecified severity: Secondary | ICD-10-CM | POA: Diagnosis not present

## 2022-10-10 DIAGNOSIS — M6281 Muscle weakness (generalized): Secondary | ICD-10-CM | POA: Diagnosis not present

## 2022-10-10 DIAGNOSIS — I252 Old myocardial infarction: Secondary | ICD-10-CM | POA: Diagnosis not present

## 2022-10-10 DIAGNOSIS — Z794 Long term (current) use of insulin: Secondary | ICD-10-CM | POA: Diagnosis not present

## 2022-10-10 DIAGNOSIS — Z7985 Long-term (current) use of injectable non-insulin antidiabetic drugs: Secondary | ICD-10-CM | POA: Diagnosis not present

## 2022-10-10 DIAGNOSIS — I130 Hypertensive heart and chronic kidney disease with heart failure and stage 1 through stage 4 chronic kidney disease, or unspecified chronic kidney disease: Secondary | ICD-10-CM | POA: Diagnosis not present

## 2022-10-10 DIAGNOSIS — I5032 Chronic diastolic (congestive) heart failure: Secondary | ICD-10-CM | POA: Diagnosis not present

## 2022-10-10 DIAGNOSIS — I87311 Chronic venous hypertension (idiopathic) with ulcer of right lower extremity: Secondary | ICD-10-CM | POA: Diagnosis not present

## 2022-10-10 DIAGNOSIS — E1142 Type 2 diabetes mellitus with diabetic polyneuropathy: Secondary | ICD-10-CM | POA: Diagnosis not present

## 2022-10-10 DIAGNOSIS — Z9181 History of falling: Secondary | ICD-10-CM | POA: Diagnosis not present

## 2022-10-10 DIAGNOSIS — M810 Age-related osteoporosis without current pathological fracture: Secondary | ICD-10-CM | POA: Diagnosis not present

## 2022-10-10 DIAGNOSIS — Z4800 Encounter for change or removal of nonsurgical wound dressing: Secondary | ICD-10-CM | POA: Diagnosis not present

## 2022-10-10 DIAGNOSIS — Z7984 Long term (current) use of oral hypoglycemic drugs: Secondary | ICD-10-CM | POA: Diagnosis not present

## 2022-10-10 DIAGNOSIS — E1122 Type 2 diabetes mellitus with diabetic chronic kidney disease: Secondary | ICD-10-CM | POA: Diagnosis not present

## 2022-10-10 DIAGNOSIS — N1831 Chronic kidney disease, stage 3a: Secondary | ICD-10-CM | POA: Diagnosis not present

## 2022-10-10 DIAGNOSIS — E1151 Type 2 diabetes mellitus with diabetic peripheral angiopathy without gangrene: Secondary | ICD-10-CM | POA: Diagnosis not present

## 2022-10-10 DIAGNOSIS — I890 Lymphedema, not elsewhere classified: Secondary | ICD-10-CM | POA: Diagnosis not present

## 2022-10-10 DIAGNOSIS — I255 Ischemic cardiomyopathy: Secondary | ICD-10-CM | POA: Diagnosis not present

## 2022-10-12 DIAGNOSIS — L97529 Non-pressure chronic ulcer of other part of left foot with unspecified severity: Secondary | ICD-10-CM | POA: Diagnosis not present

## 2022-10-12 DIAGNOSIS — N1831 Chronic kidney disease, stage 3a: Secondary | ICD-10-CM | POA: Diagnosis not present

## 2022-10-12 DIAGNOSIS — I5032 Chronic diastolic (congestive) heart failure: Secondary | ICD-10-CM | POA: Diagnosis not present

## 2022-10-12 DIAGNOSIS — I130 Hypertensive heart and chronic kidney disease with heart failure and stage 1 through stage 4 chronic kidney disease, or unspecified chronic kidney disease: Secondary | ICD-10-CM | POA: Diagnosis not present

## 2022-10-12 DIAGNOSIS — Z9181 History of falling: Secondary | ICD-10-CM | POA: Diagnosis not present

## 2022-10-12 DIAGNOSIS — Z7984 Long term (current) use of oral hypoglycemic drugs: Secondary | ICD-10-CM | POA: Diagnosis not present

## 2022-10-12 DIAGNOSIS — I87311 Chronic venous hypertension (idiopathic) with ulcer of right lower extremity: Secondary | ICD-10-CM | POA: Diagnosis not present

## 2022-10-12 DIAGNOSIS — I255 Ischemic cardiomyopathy: Secondary | ICD-10-CM | POA: Diagnosis not present

## 2022-10-12 DIAGNOSIS — E1122 Type 2 diabetes mellitus with diabetic chronic kidney disease: Secondary | ICD-10-CM | POA: Diagnosis not present

## 2022-10-12 DIAGNOSIS — Z4800 Encounter for change or removal of nonsurgical wound dressing: Secondary | ICD-10-CM | POA: Diagnosis not present

## 2022-10-12 DIAGNOSIS — M810 Age-related osteoporosis without current pathological fracture: Secondary | ICD-10-CM | POA: Diagnosis not present

## 2022-10-12 DIAGNOSIS — I890 Lymphedema, not elsewhere classified: Secondary | ICD-10-CM | POA: Diagnosis not present

## 2022-10-12 DIAGNOSIS — E1142 Type 2 diabetes mellitus with diabetic polyneuropathy: Secondary | ICD-10-CM | POA: Diagnosis not present

## 2022-10-12 DIAGNOSIS — I252 Old myocardial infarction: Secondary | ICD-10-CM | POA: Diagnosis not present

## 2022-10-12 DIAGNOSIS — E785 Hyperlipidemia, unspecified: Secondary | ICD-10-CM | POA: Diagnosis not present

## 2022-10-12 DIAGNOSIS — Z7985 Long-term (current) use of injectable non-insulin antidiabetic drugs: Secondary | ICD-10-CM | POA: Diagnosis not present

## 2022-10-12 DIAGNOSIS — M6281 Muscle weakness (generalized): Secondary | ICD-10-CM | POA: Diagnosis not present

## 2022-10-12 DIAGNOSIS — E1151 Type 2 diabetes mellitus with diabetic peripheral angiopathy without gangrene: Secondary | ICD-10-CM | POA: Diagnosis not present

## 2022-10-12 DIAGNOSIS — Z794 Long term (current) use of insulin: Secondary | ICD-10-CM | POA: Diagnosis not present

## 2022-10-14 DIAGNOSIS — E785 Hyperlipidemia, unspecified: Secondary | ICD-10-CM | POA: Diagnosis not present

## 2022-10-14 DIAGNOSIS — I890 Lymphedema, not elsewhere classified: Secondary | ICD-10-CM | POA: Diagnosis not present

## 2022-10-14 DIAGNOSIS — I130 Hypertensive heart and chronic kidney disease with heart failure and stage 1 through stage 4 chronic kidney disease, or unspecified chronic kidney disease: Secondary | ICD-10-CM | POA: Diagnosis not present

## 2022-10-14 DIAGNOSIS — M6281 Muscle weakness (generalized): Secondary | ICD-10-CM | POA: Diagnosis not present

## 2022-10-14 DIAGNOSIS — E1142 Type 2 diabetes mellitus with diabetic polyneuropathy: Secondary | ICD-10-CM | POA: Diagnosis not present

## 2022-10-14 DIAGNOSIS — I5032 Chronic diastolic (congestive) heart failure: Secondary | ICD-10-CM | POA: Diagnosis not present

## 2022-10-14 DIAGNOSIS — I87311 Chronic venous hypertension (idiopathic) with ulcer of right lower extremity: Secondary | ICD-10-CM | POA: Diagnosis not present

## 2022-10-14 DIAGNOSIS — E1151 Type 2 diabetes mellitus with diabetic peripheral angiopathy without gangrene: Secondary | ICD-10-CM | POA: Diagnosis not present

## 2022-10-14 DIAGNOSIS — L97529 Non-pressure chronic ulcer of other part of left foot with unspecified severity: Secondary | ICD-10-CM | POA: Diagnosis not present

## 2022-10-14 DIAGNOSIS — Z4800 Encounter for change or removal of nonsurgical wound dressing: Secondary | ICD-10-CM | POA: Diagnosis not present

## 2022-10-14 DIAGNOSIS — Z7985 Long-term (current) use of injectable non-insulin antidiabetic drugs: Secondary | ICD-10-CM | POA: Diagnosis not present

## 2022-10-14 DIAGNOSIS — Z9181 History of falling: Secondary | ICD-10-CM | POA: Diagnosis not present

## 2022-10-14 DIAGNOSIS — M810 Age-related osteoporosis without current pathological fracture: Secondary | ICD-10-CM | POA: Diagnosis not present

## 2022-10-14 DIAGNOSIS — E1122 Type 2 diabetes mellitus with diabetic chronic kidney disease: Secondary | ICD-10-CM | POA: Diagnosis not present

## 2022-10-14 DIAGNOSIS — I252 Old myocardial infarction: Secondary | ICD-10-CM | POA: Diagnosis not present

## 2022-10-14 DIAGNOSIS — Z7984 Long term (current) use of oral hypoglycemic drugs: Secondary | ICD-10-CM | POA: Diagnosis not present

## 2022-10-14 DIAGNOSIS — N1831 Chronic kidney disease, stage 3a: Secondary | ICD-10-CM | POA: Diagnosis not present

## 2022-10-14 DIAGNOSIS — I255 Ischemic cardiomyopathy: Secondary | ICD-10-CM | POA: Diagnosis not present

## 2022-10-14 DIAGNOSIS — Z794 Long term (current) use of insulin: Secondary | ICD-10-CM | POA: Diagnosis not present

## 2022-10-16 DIAGNOSIS — E1122 Type 2 diabetes mellitus with diabetic chronic kidney disease: Secondary | ICD-10-CM | POA: Diagnosis not present

## 2022-10-16 DIAGNOSIS — M810 Age-related osteoporosis without current pathological fracture: Secondary | ICD-10-CM | POA: Diagnosis not present

## 2022-10-16 DIAGNOSIS — E1142 Type 2 diabetes mellitus with diabetic polyneuropathy: Secondary | ICD-10-CM | POA: Diagnosis not present

## 2022-10-16 DIAGNOSIS — I5032 Chronic diastolic (congestive) heart failure: Secondary | ICD-10-CM | POA: Diagnosis not present

## 2022-10-16 DIAGNOSIS — Z9181 History of falling: Secondary | ICD-10-CM | POA: Diagnosis not present

## 2022-10-16 DIAGNOSIS — Z7984 Long term (current) use of oral hypoglycemic drugs: Secondary | ICD-10-CM | POA: Diagnosis not present

## 2022-10-16 DIAGNOSIS — L97529 Non-pressure chronic ulcer of other part of left foot with unspecified severity: Secondary | ICD-10-CM | POA: Diagnosis not present

## 2022-10-16 DIAGNOSIS — M6281 Muscle weakness (generalized): Secondary | ICD-10-CM | POA: Diagnosis not present

## 2022-10-16 DIAGNOSIS — E785 Hyperlipidemia, unspecified: Secondary | ICD-10-CM | POA: Diagnosis not present

## 2022-10-16 DIAGNOSIS — N1831 Chronic kidney disease, stage 3a: Secondary | ICD-10-CM | POA: Diagnosis not present

## 2022-10-16 DIAGNOSIS — Z7985 Long-term (current) use of injectable non-insulin antidiabetic drugs: Secondary | ICD-10-CM | POA: Diagnosis not present

## 2022-10-16 DIAGNOSIS — I87311 Chronic venous hypertension (idiopathic) with ulcer of right lower extremity: Secondary | ICD-10-CM | POA: Diagnosis not present

## 2022-10-16 DIAGNOSIS — Z794 Long term (current) use of insulin: Secondary | ICD-10-CM | POA: Diagnosis not present

## 2022-10-16 DIAGNOSIS — I252 Old myocardial infarction: Secondary | ICD-10-CM | POA: Diagnosis not present

## 2022-10-16 DIAGNOSIS — E1151 Type 2 diabetes mellitus with diabetic peripheral angiopathy without gangrene: Secondary | ICD-10-CM | POA: Diagnosis not present

## 2022-10-16 DIAGNOSIS — I890 Lymphedema, not elsewhere classified: Secondary | ICD-10-CM | POA: Diagnosis not present

## 2022-10-16 DIAGNOSIS — I255 Ischemic cardiomyopathy: Secondary | ICD-10-CM | POA: Diagnosis not present

## 2022-10-16 DIAGNOSIS — Z4800 Encounter for change or removal of nonsurgical wound dressing: Secondary | ICD-10-CM | POA: Diagnosis not present

## 2022-10-16 DIAGNOSIS — I130 Hypertensive heart and chronic kidney disease with heart failure and stage 1 through stage 4 chronic kidney disease, or unspecified chronic kidney disease: Secondary | ICD-10-CM | POA: Diagnosis not present

## 2022-10-18 DIAGNOSIS — M6281 Muscle weakness (generalized): Secondary | ICD-10-CM | POA: Diagnosis not present

## 2022-10-18 DIAGNOSIS — L89323 Pressure ulcer of left buttock, stage 3: Secondary | ICD-10-CM | POA: Diagnosis not present

## 2022-10-20 DIAGNOSIS — L97526 Non-pressure chronic ulcer of other part of left foot with bone involvement without evidence of necrosis: Secondary | ICD-10-CM | POA: Diagnosis not present

## 2022-10-20 DIAGNOSIS — M6281 Muscle weakness (generalized): Secondary | ICD-10-CM | POA: Diagnosis not present

## 2022-10-20 DIAGNOSIS — Z89421 Acquired absence of other right toe(s): Secondary | ICD-10-CM | POA: Diagnosis not present

## 2022-10-20 DIAGNOSIS — N189 Chronic kidney disease, unspecified: Secondary | ICD-10-CM | POA: Diagnosis not present

## 2022-10-20 DIAGNOSIS — E785 Hyperlipidemia, unspecified: Secondary | ICD-10-CM | POA: Diagnosis not present

## 2022-10-20 DIAGNOSIS — Z89411 Acquired absence of right great toe: Secondary | ICD-10-CM | POA: Diagnosis not present

## 2022-10-20 DIAGNOSIS — Z87448 Personal history of other diseases of urinary system: Secondary | ICD-10-CM | POA: Diagnosis not present

## 2022-10-20 DIAGNOSIS — E11621 Type 2 diabetes mellitus with foot ulcer: Secondary | ICD-10-CM | POA: Diagnosis not present

## 2022-10-20 DIAGNOSIS — I130 Hypertensive heart and chronic kidney disease with heart failure and stage 1 through stage 4 chronic kidney disease, or unspecified chronic kidney disease: Secondary | ICD-10-CM | POA: Diagnosis not present

## 2022-10-20 DIAGNOSIS — I5020 Unspecified systolic (congestive) heart failure: Secondary | ICD-10-CM | POA: Diagnosis not present

## 2022-10-20 DIAGNOSIS — N1832 Chronic kidney disease, stage 3b: Secondary | ICD-10-CM | POA: Diagnosis not present

## 2022-10-20 DIAGNOSIS — S91302A Unspecified open wound, left foot, initial encounter: Secondary | ICD-10-CM | POA: Diagnosis not present

## 2022-10-20 DIAGNOSIS — Z7984 Long term (current) use of oral hypoglycemic drugs: Secondary | ICD-10-CM | POA: Diagnosis not present

## 2022-10-20 DIAGNOSIS — N179 Acute kidney failure, unspecified: Secondary | ICD-10-CM | POA: Diagnosis not present

## 2022-10-20 DIAGNOSIS — Z743 Need for continuous supervision: Secondary | ICD-10-CM | POA: Diagnosis not present

## 2022-10-20 DIAGNOSIS — A419 Sepsis, unspecified organism: Secondary | ICD-10-CM | POA: Diagnosis not present

## 2022-10-20 DIAGNOSIS — I252 Old myocardial infarction: Secondary | ICD-10-CM | POA: Diagnosis not present

## 2022-10-20 DIAGNOSIS — R5381 Other malaise: Secondary | ICD-10-CM | POA: Diagnosis not present

## 2022-10-20 DIAGNOSIS — R509 Fever, unspecified: Secondary | ICD-10-CM | POA: Diagnosis not present

## 2022-10-20 DIAGNOSIS — Z792 Long term (current) use of antibiotics: Secondary | ICD-10-CM | POA: Diagnosis not present

## 2022-10-20 DIAGNOSIS — M869 Osteomyelitis, unspecified: Secondary | ICD-10-CM | POA: Diagnosis not present

## 2022-10-20 DIAGNOSIS — I255 Ischemic cardiomyopathy: Secondary | ICD-10-CM | POA: Diagnosis not present

## 2022-10-20 DIAGNOSIS — I129 Hypertensive chronic kidney disease with stage 1 through stage 4 chronic kidney disease, or unspecified chronic kidney disease: Secondary | ICD-10-CM | POA: Diagnosis not present

## 2022-10-20 DIAGNOSIS — Z882 Allergy status to sulfonamides status: Secondary | ICD-10-CM | POA: Diagnosis not present

## 2022-10-20 DIAGNOSIS — Z1152 Encounter for screening for COVID-19: Secondary | ICD-10-CM | POA: Diagnosis not present

## 2022-10-20 DIAGNOSIS — Z87891 Personal history of nicotine dependence: Secondary | ICD-10-CM | POA: Diagnosis not present

## 2022-10-20 DIAGNOSIS — M62561 Muscle wasting and atrophy, not elsewhere classified, right lower leg: Secondary | ICD-10-CM | POA: Diagnosis not present

## 2022-10-20 DIAGNOSIS — G4700 Insomnia, unspecified: Secondary | ICD-10-CM | POA: Diagnosis not present

## 2022-10-20 DIAGNOSIS — M810 Age-related osteoporosis without current pathological fracture: Secondary | ICD-10-CM | POA: Diagnosis not present

## 2022-10-20 DIAGNOSIS — I5032 Chronic diastolic (congestive) heart failure: Secondary | ICD-10-CM | POA: Diagnosis not present

## 2022-10-20 DIAGNOSIS — Z89412 Acquired absence of left great toe: Secondary | ICD-10-CM | POA: Diagnosis not present

## 2022-10-20 DIAGNOSIS — R0989 Other specified symptoms and signs involving the circulatory and respiratory systems: Secondary | ICD-10-CM | POA: Diagnosis not present

## 2022-10-20 DIAGNOSIS — E1142 Type 2 diabetes mellitus with diabetic polyneuropathy: Secondary | ICD-10-CM | POA: Diagnosis not present

## 2022-10-20 DIAGNOSIS — M86172 Other acute osteomyelitis, left ankle and foot: Secondary | ICD-10-CM | POA: Diagnosis not present

## 2022-10-20 DIAGNOSIS — I890 Lymphedema, not elsewhere classified: Secondary | ICD-10-CM | POA: Diagnosis not present

## 2022-10-20 DIAGNOSIS — Z885 Allergy status to narcotic agent status: Secondary | ICD-10-CM | POA: Diagnosis not present

## 2022-10-20 DIAGNOSIS — Z7985 Long-term (current) use of injectable non-insulin antidiabetic drugs: Secondary | ICD-10-CM | POA: Diagnosis not present

## 2022-10-20 DIAGNOSIS — E1140 Type 2 diabetes mellitus with diabetic neuropathy, unspecified: Secondary | ICD-10-CM | POA: Diagnosis not present

## 2022-10-20 DIAGNOSIS — M7732 Calcaneal spur, left foot: Secondary | ICD-10-CM | POA: Diagnosis not present

## 2022-10-20 DIAGNOSIS — I739 Peripheral vascular disease, unspecified: Secondary | ICD-10-CM | POA: Diagnosis not present

## 2022-10-20 DIAGNOSIS — Z7902 Long term (current) use of antithrombotics/antiplatelets: Secondary | ICD-10-CM | POA: Diagnosis not present

## 2022-10-20 DIAGNOSIS — M1990 Unspecified osteoarthritis, unspecified site: Secondary | ICD-10-CM | POA: Diagnosis not present

## 2022-10-20 DIAGNOSIS — B9562 Methicillin resistant Staphylococcus aureus infection as the cause of diseases classified elsewhere: Secondary | ICD-10-CM | POA: Diagnosis not present

## 2022-10-20 DIAGNOSIS — Z9181 History of falling: Secondary | ICD-10-CM | POA: Diagnosis not present

## 2022-10-20 DIAGNOSIS — I2510 Atherosclerotic heart disease of native coronary artery without angina pectoris: Secondary | ICD-10-CM | POA: Diagnosis not present

## 2022-10-20 DIAGNOSIS — Z4800 Encounter for change or removal of nonsurgical wound dressing: Secondary | ICD-10-CM | POA: Diagnosis not present

## 2022-10-20 DIAGNOSIS — L97529 Non-pressure chronic ulcer of other part of left foot with unspecified severity: Secondary | ICD-10-CM | POA: Diagnosis not present

## 2022-10-20 DIAGNOSIS — E1169 Type 2 diabetes mellitus with other specified complication: Secondary | ICD-10-CM | POA: Diagnosis not present

## 2022-10-20 DIAGNOSIS — R6889 Other general symptoms and signs: Secondary | ICD-10-CM | POA: Diagnosis not present

## 2022-10-20 DIAGNOSIS — H9193 Unspecified hearing loss, bilateral: Secondary | ICD-10-CM | POA: Diagnosis not present

## 2022-10-20 DIAGNOSIS — E1151 Type 2 diabetes mellitus with diabetic peripheral angiopathy without gangrene: Secondary | ICD-10-CM | POA: Diagnosis not present

## 2022-10-20 DIAGNOSIS — Z79899 Other long term (current) drug therapy: Secondary | ICD-10-CM | POA: Diagnosis not present

## 2022-10-20 DIAGNOSIS — I110 Hypertensive heart disease with heart failure: Secondary | ICD-10-CM | POA: Diagnosis not present

## 2022-10-20 DIAGNOSIS — R6520 Severe sepsis without septic shock: Secondary | ICD-10-CM | POA: Diagnosis not present

## 2022-10-20 DIAGNOSIS — Z8673 Personal history of transient ischemic attack (TIA), and cerebral infarction without residual deficits: Secondary | ICD-10-CM | POA: Diagnosis not present

## 2022-10-20 DIAGNOSIS — Z8679 Personal history of other diseases of the circulatory system: Secondary | ICD-10-CM | POA: Diagnosis not present

## 2022-10-20 DIAGNOSIS — E1122 Type 2 diabetes mellitus with diabetic chronic kidney disease: Secondary | ICD-10-CM | POA: Diagnosis not present

## 2022-10-20 DIAGNOSIS — I10 Essential (primary) hypertension: Secondary | ICD-10-CM | POA: Diagnosis not present

## 2022-10-20 DIAGNOSIS — I6529 Occlusion and stenosis of unspecified carotid artery: Secondary | ICD-10-CM | POA: Diagnosis not present

## 2022-10-20 DIAGNOSIS — R531 Weakness: Secondary | ICD-10-CM | POA: Diagnosis not present

## 2022-10-20 DIAGNOSIS — A412 Sepsis due to unspecified staphylococcus: Secondary | ICD-10-CM | POA: Diagnosis not present

## 2022-10-20 DIAGNOSIS — N1831 Chronic kidney disease, stage 3a: Secondary | ICD-10-CM | POA: Diagnosis not present

## 2022-10-20 DIAGNOSIS — Z794 Long term (current) use of insulin: Secondary | ICD-10-CM | POA: Diagnosis not present

## 2022-10-20 DIAGNOSIS — Z4781 Encounter for orthopedic aftercare following surgical amputation: Secondary | ICD-10-CM | POA: Diagnosis not present

## 2022-10-20 DIAGNOSIS — M86072 Acute hematogenous osteomyelitis, left ankle and foot: Secondary | ICD-10-CM | POA: Diagnosis not present

## 2022-10-20 DIAGNOSIS — I87311 Chronic venous hypertension (idiopathic) with ulcer of right lower extremity: Secondary | ICD-10-CM | POA: Diagnosis not present

## 2022-10-20 DIAGNOSIS — M7989 Other specified soft tissue disorders: Secondary | ICD-10-CM | POA: Diagnosis not present

## 2022-10-21 DIAGNOSIS — E1169 Type 2 diabetes mellitus with other specified complication: Secondary | ICD-10-CM | POA: Diagnosis not present

## 2022-10-21 DIAGNOSIS — M869 Osteomyelitis, unspecified: Secondary | ICD-10-CM | POA: Diagnosis not present

## 2022-10-21 DIAGNOSIS — Z87448 Personal history of other diseases of urinary system: Secondary | ICD-10-CM | POA: Diagnosis not present

## 2022-10-21 DIAGNOSIS — Z792 Long term (current) use of antibiotics: Secondary | ICD-10-CM | POA: Diagnosis not present

## 2022-10-21 DIAGNOSIS — A419 Sepsis, unspecified organism: Secondary | ICD-10-CM | POA: Diagnosis not present

## 2022-10-21 DIAGNOSIS — I10 Essential (primary) hypertension: Secondary | ICD-10-CM | POA: Diagnosis not present

## 2022-10-21 DIAGNOSIS — M86072 Acute hematogenous osteomyelitis, left ankle and foot: Secondary | ICD-10-CM | POA: Diagnosis not present

## 2022-10-21 DIAGNOSIS — Z89412 Acquired absence of left great toe: Secondary | ICD-10-CM | POA: Diagnosis not present

## 2022-10-22 ENCOUNTER — Other Ambulatory Visit: Payer: Self-pay | Admitting: *Deleted

## 2022-10-22 DIAGNOSIS — A419 Sepsis, unspecified organism: Secondary | ICD-10-CM | POA: Diagnosis not present

## 2022-10-22 DIAGNOSIS — N189 Chronic kidney disease, unspecified: Secondary | ICD-10-CM | POA: Diagnosis not present

## 2022-10-22 DIAGNOSIS — Z8673 Personal history of transient ischemic attack (TIA), and cerebral infarction without residual deficits: Secondary | ICD-10-CM | POA: Diagnosis not present

## 2022-10-22 DIAGNOSIS — I739 Peripheral vascular disease, unspecified: Secondary | ICD-10-CM

## 2022-10-22 DIAGNOSIS — E1122 Type 2 diabetes mellitus with diabetic chronic kidney disease: Secondary | ICD-10-CM | POA: Diagnosis not present

## 2022-10-22 DIAGNOSIS — I129 Hypertensive chronic kidney disease with stage 1 through stage 4 chronic kidney disease, or unspecified chronic kidney disease: Secondary | ICD-10-CM | POA: Diagnosis not present

## 2022-10-22 DIAGNOSIS — N179 Acute kidney failure, unspecified: Secondary | ICD-10-CM | POA: Diagnosis not present

## 2022-10-22 DIAGNOSIS — M869 Osteomyelitis, unspecified: Secondary | ICD-10-CM | POA: Diagnosis not present

## 2022-10-22 DIAGNOSIS — Z95828 Presence of other vascular implants and grafts: Secondary | ICD-10-CM

## 2022-10-22 DIAGNOSIS — I70262 Atherosclerosis of native arteries of extremities with gangrene, left leg: Secondary | ICD-10-CM

## 2022-10-22 DIAGNOSIS — I70635 Atherosclerosis of nonbiological bypass graft(s) of the right leg with ulceration of other part of foot: Secondary | ICD-10-CM

## 2022-10-23 DIAGNOSIS — E1169 Type 2 diabetes mellitus with other specified complication: Secondary | ICD-10-CM | POA: Diagnosis not present

## 2022-10-23 DIAGNOSIS — Z89412 Acquired absence of left great toe: Secondary | ICD-10-CM | POA: Diagnosis not present

## 2022-10-23 DIAGNOSIS — E1122 Type 2 diabetes mellitus with diabetic chronic kidney disease: Secondary | ICD-10-CM | POA: Diagnosis not present

## 2022-10-23 DIAGNOSIS — M869 Osteomyelitis, unspecified: Secondary | ICD-10-CM | POA: Diagnosis not present

## 2022-10-23 DIAGNOSIS — I129 Hypertensive chronic kidney disease with stage 1 through stage 4 chronic kidney disease, or unspecified chronic kidney disease: Secondary | ICD-10-CM | POA: Diagnosis not present

## 2022-10-23 DIAGNOSIS — N179 Acute kidney failure, unspecified: Secondary | ICD-10-CM | POA: Diagnosis not present

## 2022-10-23 DIAGNOSIS — N189 Chronic kidney disease, unspecified: Secondary | ICD-10-CM | POA: Diagnosis not present

## 2022-10-24 DIAGNOSIS — M869 Osteomyelitis, unspecified: Secondary | ICD-10-CM | POA: Diagnosis not present

## 2022-10-24 DIAGNOSIS — I129 Hypertensive chronic kidney disease with stage 1 through stage 4 chronic kidney disease, or unspecified chronic kidney disease: Secondary | ICD-10-CM | POA: Diagnosis not present

## 2022-10-24 DIAGNOSIS — A419 Sepsis, unspecified organism: Secondary | ICD-10-CM | POA: Diagnosis not present

## 2022-10-24 DIAGNOSIS — N189 Chronic kidney disease, unspecified: Secondary | ICD-10-CM | POA: Diagnosis not present

## 2022-10-24 DIAGNOSIS — Z8673 Personal history of transient ischemic attack (TIA), and cerebral infarction without residual deficits: Secondary | ICD-10-CM | POA: Diagnosis not present

## 2022-10-24 DIAGNOSIS — Z79899 Other long term (current) drug therapy: Secondary | ICD-10-CM | POA: Diagnosis not present

## 2022-10-25 DIAGNOSIS — N179 Acute kidney failure, unspecified: Secondary | ICD-10-CM | POA: Diagnosis not present

## 2022-10-25 DIAGNOSIS — E1169 Type 2 diabetes mellitus with other specified complication: Secondary | ICD-10-CM | POA: Diagnosis not present

## 2022-10-25 DIAGNOSIS — R6520 Severe sepsis without septic shock: Secondary | ICD-10-CM | POA: Diagnosis not present

## 2022-10-25 DIAGNOSIS — A412 Sepsis due to unspecified staphylococcus: Secondary | ICD-10-CM | POA: Diagnosis not present

## 2022-10-25 DIAGNOSIS — M869 Osteomyelitis, unspecified: Secondary | ICD-10-CM | POA: Diagnosis not present

## 2022-10-25 DIAGNOSIS — Z792 Long term (current) use of antibiotics: Secondary | ICD-10-CM | POA: Diagnosis not present

## 2022-10-25 DIAGNOSIS — Z87448 Personal history of other diseases of urinary system: Secondary | ICD-10-CM | POA: Diagnosis not present

## 2022-10-26 DIAGNOSIS — I130 Hypertensive heart and chronic kidney disease with heart failure and stage 1 through stage 4 chronic kidney disease, or unspecified chronic kidney disease: Secondary | ICD-10-CM | POA: Diagnosis not present

## 2022-10-26 DIAGNOSIS — E119 Type 2 diabetes mellitus without complications: Secondary | ICD-10-CM | POA: Diagnosis not present

## 2022-10-26 DIAGNOSIS — I6529 Occlusion and stenosis of unspecified carotid artery: Secondary | ICD-10-CM | POA: Diagnosis not present

## 2022-10-26 DIAGNOSIS — E785 Hyperlipidemia, unspecified: Secondary | ICD-10-CM | POA: Diagnosis not present

## 2022-10-26 DIAGNOSIS — N1831 Chronic kidney disease, stage 3a: Secondary | ICD-10-CM | POA: Diagnosis not present

## 2022-10-26 DIAGNOSIS — I5022 Chronic systolic (congestive) heart failure: Secondary | ICD-10-CM | POA: Diagnosis not present

## 2022-10-26 DIAGNOSIS — E1140 Type 2 diabetes mellitus with diabetic neuropathy, unspecified: Secondary | ICD-10-CM | POA: Diagnosis not present

## 2022-10-26 DIAGNOSIS — I10 Essential (primary) hypertension: Secondary | ICD-10-CM | POA: Diagnosis not present

## 2022-10-26 DIAGNOSIS — H9193 Unspecified hearing loss, bilateral: Secondary | ICD-10-CM | POA: Diagnosis not present

## 2022-10-26 DIAGNOSIS — Z7401 Bed confinement status: Secondary | ICD-10-CM | POA: Diagnosis not present

## 2022-10-26 DIAGNOSIS — L97516 Non-pressure chronic ulcer of other part of right foot with bone involvement without evidence of necrosis: Secondary | ICD-10-CM | POA: Diagnosis not present

## 2022-10-26 DIAGNOSIS — Z7984 Long term (current) use of oral hypoglycemic drugs: Secondary | ICD-10-CM | POA: Diagnosis not present

## 2022-10-26 DIAGNOSIS — E11621 Type 2 diabetes mellitus with foot ulcer: Secondary | ICD-10-CM | POA: Diagnosis not present

## 2022-10-26 DIAGNOSIS — M1990 Unspecified osteoarthritis, unspecified site: Secondary | ICD-10-CM | POA: Diagnosis not present

## 2022-10-26 DIAGNOSIS — B9562 Methicillin resistant Staphylococcus aureus infection as the cause of diseases classified elsewhere: Secondary | ICD-10-CM | POA: Diagnosis not present

## 2022-10-26 DIAGNOSIS — F32A Depression, unspecified: Secondary | ICD-10-CM

## 2022-10-26 DIAGNOSIS — M62561 Muscle wasting and atrophy, not elsewhere classified, right lower leg: Secondary | ICD-10-CM | POA: Diagnosis not present

## 2022-10-26 DIAGNOSIS — Z89421 Acquired absence of other right toe(s): Secondary | ICD-10-CM | POA: Diagnosis not present

## 2022-10-26 DIAGNOSIS — I2510 Atherosclerotic heart disease of native coronary artery without angina pectoris: Secondary | ICD-10-CM | POA: Diagnosis not present

## 2022-10-26 DIAGNOSIS — I70262 Atherosclerosis of native arteries of extremities with gangrene, left leg: Secondary | ICD-10-CM | POA: Diagnosis not present

## 2022-10-26 DIAGNOSIS — E1169 Type 2 diabetes mellitus with other specified complication: Secondary | ICD-10-CM | POA: Diagnosis not present

## 2022-10-26 DIAGNOSIS — I509 Heart failure, unspecified: Secondary | ICD-10-CM | POA: Diagnosis not present

## 2022-10-26 DIAGNOSIS — Z89411 Acquired absence of right great toe: Secondary | ICD-10-CM | POA: Diagnosis not present

## 2022-10-26 DIAGNOSIS — Z882 Allergy status to sulfonamides status: Secondary | ICD-10-CM | POA: Diagnosis not present

## 2022-10-26 DIAGNOSIS — Z89412 Acquired absence of left great toe: Secondary | ICD-10-CM | POA: Diagnosis not present

## 2022-10-26 DIAGNOSIS — L97529 Non-pressure chronic ulcer of other part of left foot with unspecified severity: Secondary | ICD-10-CM | POA: Diagnosis not present

## 2022-10-26 DIAGNOSIS — Z8673 Personal history of transient ischemic attack (TIA), and cerebral infarction without residual deficits: Secondary | ICD-10-CM | POA: Diagnosis not present

## 2022-10-26 DIAGNOSIS — R6520 Severe sepsis without septic shock: Secondary | ICD-10-CM | POA: Diagnosis not present

## 2022-10-26 DIAGNOSIS — I70222 Atherosclerosis of native arteries of extremities with rest pain, left leg: Secondary | ICD-10-CM | POA: Diagnosis not present

## 2022-10-26 DIAGNOSIS — R5381 Other malaise: Secondary | ICD-10-CM | POA: Diagnosis not present

## 2022-10-26 DIAGNOSIS — Z95828 Presence of other vascular implants and grafts: Secondary | ICD-10-CM | POA: Diagnosis not present

## 2022-10-26 DIAGNOSIS — I739 Peripheral vascular disease, unspecified: Secondary | ICD-10-CM | POA: Diagnosis not present

## 2022-10-26 DIAGNOSIS — Z4781 Encounter for orthopedic aftercare following surgical amputation: Secondary | ICD-10-CM | POA: Diagnosis not present

## 2022-10-26 DIAGNOSIS — E1151 Type 2 diabetes mellitus with diabetic peripheral angiopathy without gangrene: Secondary | ICD-10-CM | POA: Diagnosis not present

## 2022-10-26 DIAGNOSIS — Z743 Need for continuous supervision: Secondary | ICD-10-CM | POA: Diagnosis not present

## 2022-10-26 DIAGNOSIS — Z8679 Personal history of other diseases of the circulatory system: Secondary | ICD-10-CM | POA: Diagnosis not present

## 2022-10-26 DIAGNOSIS — G4700 Insomnia, unspecified: Secondary | ICD-10-CM | POA: Diagnosis not present

## 2022-10-26 DIAGNOSIS — I499 Cardiac arrhythmia, unspecified: Secondary | ICD-10-CM | POA: Diagnosis not present

## 2022-10-26 DIAGNOSIS — Z87891 Personal history of nicotine dependence: Secondary | ICD-10-CM | POA: Diagnosis not present

## 2022-10-26 DIAGNOSIS — I110 Hypertensive heart disease with heart failure: Secondary | ICD-10-CM | POA: Diagnosis not present

## 2022-10-26 DIAGNOSIS — A419 Sepsis, unspecified organism: Secondary | ICD-10-CM | POA: Diagnosis not present

## 2022-10-26 DIAGNOSIS — Z794 Long term (current) use of insulin: Secondary | ICD-10-CM | POA: Diagnosis not present

## 2022-10-26 DIAGNOSIS — E1122 Type 2 diabetes mellitus with diabetic chronic kidney disease: Secondary | ICD-10-CM | POA: Diagnosis not present

## 2022-10-26 DIAGNOSIS — I6523 Occlusion and stenosis of bilateral carotid arteries: Secondary | ICD-10-CM | POA: Diagnosis not present

## 2022-10-26 DIAGNOSIS — I70248 Atherosclerosis of native arteries of left leg with ulceration of other part of lower left leg: Secondary | ICD-10-CM | POA: Diagnosis not present

## 2022-10-26 DIAGNOSIS — E1142 Type 2 diabetes mellitus with diabetic polyneuropathy: Secondary | ICD-10-CM | POA: Diagnosis not present

## 2022-10-26 DIAGNOSIS — L97929 Non-pressure chronic ulcer of unspecified part of left lower leg with unspecified severity: Secondary | ICD-10-CM | POA: Diagnosis not present

## 2022-10-26 DIAGNOSIS — N1832 Chronic kidney disease, stage 3b: Secondary | ICD-10-CM | POA: Diagnosis not present

## 2022-10-26 DIAGNOSIS — I70245 Atherosclerosis of native arteries of left leg with ulceration of other part of foot: Secondary | ICD-10-CM | POA: Diagnosis not present

## 2022-10-26 DIAGNOSIS — M86072 Acute hematogenous osteomyelitis, left ankle and foot: Secondary | ICD-10-CM | POA: Diagnosis not present

## 2022-10-26 DIAGNOSIS — I890 Lymphedema, not elsewhere classified: Secondary | ICD-10-CM | POA: Diagnosis not present

## 2022-10-26 DIAGNOSIS — I70635 Atherosclerosis of nonbiological bypass graft(s) of the right leg with ulceration of other part of foot: Secondary | ICD-10-CM | POA: Diagnosis not present

## 2022-10-26 DIAGNOSIS — I5020 Unspecified systolic (congestive) heart failure: Secondary | ICD-10-CM | POA: Diagnosis not present

## 2022-10-26 DIAGNOSIS — M869 Osteomyelitis, unspecified: Secondary | ICD-10-CM | POA: Diagnosis not present

## 2022-10-26 DIAGNOSIS — R531 Weakness: Secondary | ICD-10-CM | POA: Diagnosis not present

## 2022-10-26 HISTORY — DX: Depression, unspecified: F32.A

## 2022-10-27 NOTE — Progress Notes (Unsigned)
HISTORY AND PHYSICAL   HPI: This is a 72 y.o. male who is here today as an urgent referral with tissue loss and new bone exposure on the left great toe.    James Dougherty is well-known to my practice being followed for bilateral lower extremity PVD, bilateral asymptomatic carotid stenosis.   Carotid stenosis is critical, however this has been placed on hold due to his comorbidities after recent fall, subdural hematoma, midline shift, and subsequent critical limb ischemia with tissue loss in the left foot  09/2022 intervention included DCB SFA, lithotripsy popliteal artery, PTX tibioperoneal trunk, DCB right external iliac artery. The patient asked to return to [facility] amputation as Dr. Gardiner Sleeper has performed them in the past.   ***  He presents today with critical limb ischemia of the left leg with tissue loss of the left great toe.  There is bone exposed. Denies fevers, chills He denies symptoms of TIA, stroke, amaurosis. 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   The pt is on a statin for cholesterol management.    The pt is on an aspirin.    Other AC:  Plavix The pt is on ARB, diuretic for hypertension.  The pt does not have diabetes. Tobacco hx:  former    Past Medical History:  Diagnosis Date   Arthritis    "hands" (12/27/2012)   High cholesterol    Hypertension    Neuropathic pain    PAD (peripheral artery disease) (HCC)    Psoriasis    Stroke (HCC) 1999   "still have some speech problems at times; sometimes forget what I was going to say" (12/27/2012)   Type II diabetes mellitus (HCC)    Ulcer    Left ankle/leg    Past Surgical History:  Procedure Laterality Date   ABDOMINAL AORTAGRAM N/A 03/30/2012   Procedure: ABDOMINAL Ronny Flurry;  Surgeon: Nada Libman, MD;  Location: [facility] CATH LAB;  Service: Cardiovascular;  Laterality: N/A;   ABDOMINAL AORTAGRAM N/A 12/27/2012   Procedure: ABDOMINAL Ronny Flurry;  Surgeon: Nada Libman, MD;  Location: [facility] CATH  LAB;  Service: Cardiovascular;  Laterality: N/A;   ABDOMINAL AORTOGRAM W/LOWER EXTREMITY N/A 04/05/2017   Procedure: ABDOMINAL AORTOGRAM W/LOWER EXTREMITY;  Surgeon: Sherren Kerns, MD;  Location: [facility] INVASIVE CV LAB;  Service: Cardiovascular;  Laterality: N/A;   ABDOMINAL AORTOGRAM W/LOWER EXTREMITY N/A 09/30/2022   Procedure: ABDOMINAL AORTOGRAM W/LOWER EXTREMITY;  Surgeon: Victorino Sparrow, MD;  Location: [facility] INVASIVE CV LAB;  Service: Cardiovascular;  Laterality: N/A;   AMPUTATION Right 04/26/2017   Procedure: AMPUTATION TRANSMETATARSAL RIGHT GREAT TOE;  Surgeon: Larina Earthly, MD;  Location: [facility] OR;  Service: Vascular;  Laterality: Right;   ANGIOPLASTY / STENTING FEMORAL Left 12/27/2012   APPLICATION OF WOUND VAC Right 04/26/2017   Procedure: APPLICATION OF WOUND VAC;  Surgeon: Larina Earthly, MD;  Location: [facility] OR;  Service: Vascular;  Laterality: Right;   FEMORAL ARTERY STENT  03/30/2012   LOWER EXTREMITY ANGIOGRAM  12/27/2012   Procedure: LOWER EXTREMITY ANGIOGRAM;  Surgeon: Nada Libman, MD;  Location: [facility] CATH LAB;  Service: Cardiovascular;;   LOWER EXTREMITY ANGIOGRAPHY N/A 04/19/2018   Procedure: LOWER EXTREMITY ANGIOGRAPHY;  Surgeon: Nada Libman, MD;  Location: [facility] INVASIVE CV LAB;  Service: Cardiovascular;  Laterality: N/A;   PERIPHERAL VASCULAR ATHERECTOMY Right 04/05/2017   Procedure: PERIPHERAL VASCULAR ATHERECTOMY;  Surgeon: Sherren Kerns, MD;  Location: [facility] INVASIVE CV LAB;  Service: Cardiovascular;  Laterality: Right;  superficial femoral  PERIPHERAL VASCULAR BALLOON ANGIOPLASTY  04/19/2018   Procedure: PERIPHERAL VASCULAR BALLOON ANGIOPLASTY;  Surgeon: Nada Libman, MD;  Location: [facility] INVASIVE CV LAB;  Service: Cardiovascular;;   PERIPHERAL VASCULAR BALLOON ANGIOPLASTY Left 09/30/2022   Procedure: PERIPHERAL VASCULAR BALLOON ANGIOPLASTY;  Surgeon: Victorino Sparrow, MD;  Location: [facility] INVASIVE CV LAB;  Service: Cardiovascular;  Laterality: Left;  SFA, Pop, TP trunk & Rt Ext Iliac    PERIPHERAL VASCULAR INTERVENTION Right 04/05/2017   Procedure: PERIPHERAL VASCULAR INTERVENTION;  Surgeon: Sherren Kerns, MD;  Location: [facility] INVASIVE CV LAB;  Service: Cardiovascular;  Laterality: Right;   Superficial femorl and external iliac   POPLITEAL ARTERY STENT     TONSILLECTOMY AND ADENOIDECTOMY      Allergies  Allergen Reactions   Oxycodone Nausea And Vomiting   Glipizide     Bad headache and blurred vision   Other     Soft silicone dressing caused wound to get worse   Bactrim [Sulfamethoxazole-Trimethoprim] Itching and Rash    Current Outpatient Medications  Medication Sig Dispense Refill   aspirin EC 81 MG tablet Take 1 tablet (81 mg total) by mouth daily. Swallow whole.     atorvastatin (LIPITOR) 20 MG tablet Take 1 tablet (20 mg total) by mouth at bedtime. Too soon to refill 30 tablet 0   bisacodyl (DULCOLAX) 5 MG EC tablet Take 10 mg by mouth daily as needed for moderate constipation.     bisoprolol (ZEBETA) 5 MG tablet Take 0.5 tablets (2.5 mg total) by mouth daily. 30 tablet 0   clopidogrel (PLAVIX) 75 MG tablet Take 1 tablet (75 mg total) by mouth daily. 30 tablet 11   Dulaglutide (TRULICITY) 1.5 MG/0.5ML SOPN Inject 1.5 mg into the skin every Saturday.     empagliflozin (JARDIANCE) 10 MG TABS tablet Take 1 tablet (10 mg total) by mouth daily. 90 tablet 3   furosemide (LASIX) 20 MG tablet Take 1 tablet (20 mg total) by mouth daily. (Patient taking differently: Take 40 mg by mouth daily.) 30 tablet 0   gabapentin (NEURONTIN) 600 MG tablet Take 1 tablet (600 mg total) by mouth every evening. 30 tablet 0   Podiatric Products (GOLD BOND FOOT) CREA Apply 1 application topically 2 (two) times daily as needed (DRY SKIN). DIABETIC CREAM     Povidone-Iodine (BETADINE EX) Apply 1 Application topically 3 (three) times a week.     No current facility-administered medications for this visit.    Family History  Problem Relation Age of Onset   Diabetes Sister         Bilateral amputation of lower legs   Diabetes Sister    Heart disease Sister 70       Heart disease before age 64   Hypertension Sister    Heart attack Sister    Hyperlipidemia Sister    Hypertension Father    Hyperlipidemia Father    Hyperlipidemia Mother    Hypertension Mother    Hypertension Daughter     Social History   Socioeconomic History   Marital status: Single    Spouse name: Not on file   Number of children: Not on file   Years of education: Not on file   Highest education level: Not on file  Occupational History   Not on file  Tobacco Use   Smoking status: Former    Current packs/day: 0.00    Average packs/day: 3.0 packs/day for 45.0 years (135.0 ttl pk-yrs)    Types: Cigarettes    Start date:  01/06/1966    Quit date: 01/07/2011    Years since quitting: 11.8    Passive exposure: Never   Smokeless tobacco: Never  Vaping Use   Vaping status: Never Used  Substance and Sexual Activity   Alcohol use: Not Currently    Comment: 12/27/2012 "couple times/yr I might have a drink"   Drug use: No   Sexual activity: Never  Other Topics Concern   Not on file  Social History Narrative   Not on file   Social Determinants of Health   Financial Resource Strain: Patient Declined (10/20/2022)   Received from [facility]   Overall Financial Resource Strain (CARDIA)    Difficulty of Paying Living Expenses: Patient declined  Food Insecurity: Patient Declined (10/20/2022)   Received from [facility]   Hunger Vital Sign    Worried About Running Out of Food in the Last Year: Patient declined    Ran Out of Food in the Last Year: Patient declined  Transportation Needs: Patient Declined (10/20/2022)   Received from [facility]   PRAPARE - Transportation    Lack of Transportation (Medical): Patient declined    Lack of Transportation (Non-Medical): Patient declined  Physical Activity: Inactive (10/20/2022)   Received from [facility]   Exercise Vital Sign     Days of Exercise per Week: 0 days    Minutes of Exercise per Session: 0 min  Stress: No Stress Concern Present (10/20/2022)   Received from [facility] - Occupational Stress Questionnaire    Feeling of Stress : Not at all  Social Connections: Patient Declined (10/20/2022)   Received from [facility]   Social Connection and Isolation Panel [NHANES]    Frequency of Communication with Friends and Family: Patient declined    Frequency of Social Gatherings with Friends and Family: Patient declined    Attends Religious Services: Patient declined    Database administrator or Organizations: Patient declined    Attends Banker Meetings: Patient declined    Marital Status: Patient declined  Intimate Partner Violence: Patient Declined (10/20/2022)   Received from [facility]   Humiliation, Afraid, Rape, and Kick questionnaire    Fear of Current or Ex-Partner: Patient declined    Emotionally Abused: Patient declined    Physically Abused: Patient declined    Sexually Abused: Patient declined     REVIEW OF SYSTEMS:   [X]  denotes positive finding, [ ]  denotes negative finding Cardiac  Comments:  Chest pain or chest pressure:    Shortness of breath upon exertion: x   Short of breath when lying flat:    Irregular heart rhythm:        Vascular    Pain in calf, thigh, or hip brought on by ambulation: x   Pain in feet at night that wakes you up from your sleep:     Blood clot in your veins:    Leg swelling:         Pulmonary    Oxygen at home:    Productive cough:     Wheezing:         Neurologic    Sudden weakness in arms or legs:     Sudden numbness in arms or legs:     Sudden onset of difficulty speaking or slurred speech:    Temporary loss of vision in one eye:     Problems with dizziness:  Gastrointestinal    Blood in stool:     Vomited blood:         Genitourinary    Burning when urinating:     Blood  in urine:        Psychiatric    Major depression:         Hematologic    Bleeding problems:    Problems with blood clotting too easily:        Skin    Rashes or ulcers: x       Constitutional    Fever or chills:      PHYSICAL EXAMINATION:  There were no vitals filed for this visit.   There is no height or weight on file to calculate BMI.   General:  WDWN in NAD; vital signs documented above Gait: Not observed HENT: WNL, normocephalic Pulmonary: normal non-labored breathing , without wheezing Cardiac: regular HR, with carotid bruit on the right Abdomen: soft, NT Skin: with rash in left groin that is malodorous Vascular Exam/Pulses:  Right Left  Radial 2+ (normal) 2+ (normal)  Femoral Unable to palpate Unable to palpate  DP monophasic Dampened monophasic  PT Brisk biphasic Brisk monophasic  Peroneal monophasic Dampened monophasic   Extremities:      Musculoskeletal: no muscle wasting or atrophy  Neurologic: A&O X 3 Psychiatric:  The pt has Normal affect.   Non-Invasive Vascular Imaging:    Summary:  Right Carotid: Velocities in the right ICA are consistent with a 80-99%                 stenosis.   Left Carotid: Velocities in the left ICA are consistent with a 40-59%  stenosis.   Vertebrals: Bilateral vertebral arteries demonstrate antegrade flow.   *See table(s) above for measurements and observations.   _________________________________________________________  ABI Findings:  ABI Findings:  +---------+------------------+-----+----------+---------+  Right   Rt Pressure (mmHg)IndexWaveform  Comment    +---------+------------------+-----+----------+---------+  Brachial 135                                         +---------+------------------+-----+----------+---------+  PTA                            monophasic           +---------+------------------+-----+----------+---------+  DP                             monophasic            +---------+------------------+-----+----------+---------+  Great Toe                                 amputated  +---------+------------------+-----+----------+---------+   +---------+------------------+-----+----------+-------+  Left    Lt Pressure (mmHg)IndexWaveform  Comment  +---------+------------------+-----+----------+-------+  Brachial 155                                       +---------+------------------+-----+----------+-------+  PTA                            monophasic         +---------+------------------+-----+----------+-------+  DP  monophasic         +---------+------------------+-----+----------+-------+  Great Toe                                 bandage  +---------+------------------+-----+----------+-------+    +----------+--------+-----+---------------+--------+--------+  RIGHT    PSV cm/sRatioStenosis       WaveformComments  +----------+--------+-----+---------------+--------+--------+  EIA Prox  332          50-99% stenosis        stent     +----------+--------+-----+---------------+--------+--------+  EIA Mid   327          50-99% stenosis        stent     +----------+--------+-----+---------------+--------+--------+  EIA Distal181                                 stent     +----------+--------+-----+---------------+--------+--------+  CFA Distal151          30-49% stenosis                  +----------+--------+-----+---------------+--------+--------+  SFA Prox  111                                 stent     +----------+--------+-----+---------------+--------+--------+  SFA Mid                                       stent     +----------+--------+-----+---------------+--------+--------+  POP Prox  40                                            +----------+--------+-----+---------------+--------+--------+  PTA Distal32                                             +----------+--------+-----+---------------+--------+--------+       Right Stent(s):  +---------------+--------+--------+--------+--------+  SFA           PSV cm/sStenosisWaveformComments  +---------------+--------+--------+--------+--------+  Prox to Stent  151                               +---------------+--------+--------+--------+--------+  Proximal Stent 111                               +---------------+--------+--------+--------+--------+  Mid Stent      152                               +---------------+--------+--------+--------+--------+  Distal Stent   115                               +---------------+--------+--------+--------+--------+  Distal to Stent39                                +---------------+--------+--------+--------+--------+    +----------+--------+-----+---------------+----------+--------+  LEFT     PSV cm/sRatioStenosis       Waveform  Comments  +----------+--------+-----+---------------+----------+--------+  CFA Distal160          30-49% stenosis                    +----------+--------+-----+---------------+----------+--------+  POP Distal75                          monophasic          +----------+--------+-----+---------------+----------+--------+     Left Stent(s):  +---------------+--------+--------+--------+--------+  SFA           PSV cm/sStenosisWaveformComments  +---------------+--------+--------+--------+--------+  Prox to Stent  160                               +---------------+--------+--------+--------+--------+  Proximal Stent 200                               +---------------+--------+--------+--------+--------+  Mid Stent      155                               +---------------+--------+--------+--------+--------+  Distal Stent   80                                +---------------+--------+--------+--------+--------+   Distal to Stent91                                +---------------+--------+--------+--------+--------+     ASSESSMENT/PLAN:: 72 y.o. male who presents to clinic with left lower extremity critical limb ischemia with tissue loss at the left great toe, lateral aspect of the foot.  Patient also has asymptomatic bilateral carotid stenosis - The right side has been critically stenosed since his last visit.  Patient also has right-sided fourth toe amputation wound which appears to be healing nicely.  Unfortunately, James Dougherty's left great toe wound has deteriorated quickly.  He has exposed bone.  The wound has been nonhealing now for 3 weeks.  He would benefit from left lower extremity angiogram with possible intervention in an effort to improve distal perfusion for wound healing.  With his comorbidities, I think he has a high likelihood of having significant disease throughout his left lower extremity.  I am worried he will have significant small vessel disease in the tibials and in the foot.  At the time of his intervention, my plan is to assess his right sided iliac artery stents.  Recent duplex ultrasound demonstrates elevated velocities concerning for pending occlusion.  Should there be severe stenosis, I would move forward with drug-coated balloon angioplasty for primary assisted patency.  After discussing the risks and benefits of the above, James Dougherty elected to proceed.  Will place asymptomatic critical right ICA stenosis on hold.  I plan to call his daughter regarding the above.     Victorino Sparrow MD

## 2022-10-28 DIAGNOSIS — E1122 Type 2 diabetes mellitus with diabetic chronic kidney disease: Secondary | ICD-10-CM | POA: Diagnosis not present

## 2022-10-28 DIAGNOSIS — I10 Essential (primary) hypertension: Secondary | ICD-10-CM | POA: Diagnosis not present

## 2022-10-28 DIAGNOSIS — M869 Osteomyelitis, unspecified: Secondary | ICD-10-CM | POA: Diagnosis not present

## 2022-10-29 ENCOUNTER — Encounter: Payer: Medicare Other | Admitting: Vascular Surgery

## 2022-10-29 ENCOUNTER — Ambulatory Visit (HOSPITAL_COMMUNITY)
Admission: RE | Admit: 2022-10-29 | Discharge: 2022-10-29 | Disposition: A | Discharge status: Home / Self Care | Payer: Medicare Other | Source: Ambulatory Visit | Attending: Vascular Surgery | Admitting: Vascular Surgery

## 2022-10-29 ENCOUNTER — Encounter: Payer: Self-pay | Admitting: Vascular Surgery

## 2022-10-29 ENCOUNTER — Ambulatory Visit (INDEPENDENT_AMBULATORY_CARE_PROVIDER_SITE_OTHER): Payer: Medicare Other | Admitting: Vascular Surgery

## 2022-10-29 VITALS — BP 123/73 | HR 79 | Temp 98.0°F | Resp 20 | Ht 68.0 in | Wt 212.0 lb

## 2022-10-29 DIAGNOSIS — Z95828 Presence of other vascular implants and grafts: Secondary | ICD-10-CM

## 2022-10-29 DIAGNOSIS — I739 Peripheral vascular disease, unspecified: Secondary | ICD-10-CM

## 2022-10-29 DIAGNOSIS — I6523 Occlusion and stenosis of bilateral carotid arteries: Secondary | ICD-10-CM

## 2022-10-29 DIAGNOSIS — I70262 Atherosclerosis of native arteries of extremities with gangrene, left leg: Secondary | ICD-10-CM | POA: Insufficient documentation

## 2022-10-29 DIAGNOSIS — I70635 Atherosclerosis of nonbiological bypass graft(s) of the right leg with ulceration of other part of foot: Secondary | ICD-10-CM

## 2022-10-29 DIAGNOSIS — I70245 Atherosclerosis of native arteries of left leg with ulceration of other part of foot: Secondary | ICD-10-CM

## 2022-10-29 LAB — VAS US ABI WITH/WO TBI
Left ABI: 1.05
Right ABI: 0.93

## 2022-10-30 ENCOUNTER — Other Ambulatory Visit: Payer: Self-pay

## 2022-10-30 DIAGNOSIS — I6523 Occlusion and stenosis of bilateral carotid arteries: Secondary | ICD-10-CM

## 2022-10-30 DIAGNOSIS — I739 Peripheral vascular disease, unspecified: Secondary | ICD-10-CM

## 2022-11-02 ENCOUNTER — Ambulatory Visit: Payer: Medicare Other | Attending: Cardiology | Admitting: Cardiology

## 2022-11-02 VITALS — BP 118/75 | HR 112 | Wt 232.0 lb

## 2022-11-02 DIAGNOSIS — I5022 Chronic systolic (congestive) heart failure: Secondary | ICD-10-CM | POA: Diagnosis not present

## 2022-11-02 DIAGNOSIS — I739 Peripheral vascular disease, unspecified: Secondary | ICD-10-CM

## 2022-11-02 NOTE — Progress Notes (Signed)
Clinical Summary James Dougherty is a 72 y.o.male seen today for follow up of the following medical problems.       1.Abnormal stress test/Systolic dysfunction - ordered for preop for TCAR - 04/2021 nuclear stress: apical to basal anteroseptal scar with very mild ishcemia toward apex. Likely inferior artifact but cannot exclude component of scar. LVEF 23% - 05/2021 echo: poor visualization, LVEF roughly 30-40%. Grade III dd.  - 06/2021 echo with contrast: LVEF 35%, global hypokiensis - did not pursue cath due to SDH and inability to take plavix. Patient also decided would not want to pursue at any point as well       - no SOB/DOE.  No recent edema - compliant with meds. Low dose losartan and aldactone separtely led to high potassium levels, due to this did not try entresto. SOB on toprol, tolerated bisprolol - no recent chest pains  - No chest pains, no SOB/DOE - compliant with meds   2. 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  - at d/c plan for repeat CT in 2 weeks, continue to hold ASA and plavix until f/u - has f/u Dec 01/14/22, CT few days before - remains off ASA and plavix       - ok to start ASA, but not plavix per neurosurgery according to family report       2.PAD - multiple prior interventions Left SFA-pop stenting by Dr. Myra Gianotti Right great toe ray amputation by Dr. Arbie Cookey Right SFA atherectomy and stenting by Dr. Darrick Penna  - 09/2022 angioplasty SFA, popliteal, tibioperoneal trunk - left great toe amputation     3. Carotid stenosis bilateral carotid duplex ultrasound demonstrated greater than 80% stenosis of the right internal carotid artery.  He had a follow-up CT a demonstrating roughly 80% stenosis   - no plans for surgery given advanced comorbidities and poor functional status.    4. Pressure ulcer - admit 03/2022 with left buttock/sacrum ulcer   5. CKD  - Cr up to 1.7 from  03/2022 admission.  - repeat labs with pcp Thursday Past Medical History:  Diagnosis Date   Arthritis    "hands" (12/27/2012)   High cholesterol    Hypertension    Neuropathic pain    PAD (peripheral artery disease) (HCC)    Psoriasis    Stroke (HCC) 1999   "still have some speech problems at times; sometimes forget what I was going to say" (12/27/2012)   Type II diabetes mellitus (HCC)    Ulcer    Left ankle/leg     Allergies  Allergen Reactions   Oxycodone Nausea And Vomiting   Glipizide     Bad headache and blurred vision   Other     Soft silicone dressing caused wound to get worse   Bactrim [Sulfamethoxazole-Trimethoprim] Itching and Rash     Current Outpatient Medications  Medication Sig Dispense Refill   aspirin EC 81 MG tablet Take 1 tablet (81 mg total) by mouth daily. Swallow whole.     atorvastatin (LIPITOR) 20 MG tablet Take 1 tablet (20 mg total) by mouth at bedtime. Too soon to refill 30 tablet 0   bisacodyl (DULCOLAX) 5 MG EC tablet Take 10 mg by mouth daily as needed for moderate constipation.     bisoprolol (ZEBETA) 5 MG tablet Take 0.5 tablets (2.5 mg total) by mouth daily. 30 tablet 0   clopidogrel (PLAVIX) 75 MG tablet  Take 1 tablet (75 mg total) by mouth daily. 30 tablet 11   Dulaglutide (TRULICITY) 1.5 MG/0.5ML SOPN Inject 1.5 mg into the skin every Saturday.     empagliflozin (JARDIANCE) 10 MG TABS tablet Take 1 tablet (10 mg total) by mouth daily. 90 tablet 3   furosemide (LASIX) 20 MG tablet Take 1 tablet (20 mg total) by mouth daily. (Patient taking differently: Take 40 mg by mouth daily.) 30 tablet 0   gabapentin (NEURONTIN) 600 MG tablet Take 1 tablet (600 mg total) by mouth every evening. 30 tablet 0   Podiatric Products (GOLD BOND FOOT) CREA Apply 1 application topically 2 (two) times daily as needed (DRY SKIN). DIABETIC CREAM     Povidone-Iodine (BETADINE EX) Apply 1 Application topically 3 (three) times a week.     No current  facility-administered medications for this visit.     Past Surgical History:  Procedure Laterality Date   ABDOMINAL AORTAGRAM N/A 03/30/2012   Procedure: ABDOMINAL AORTAGRAM;  Surgeon: Nada Libman, MD;  Location: [facility] CATH LAB;  Service: Cardiovascular;  Laterality: N/A;   ABDOMINAL AORTAGRAM N/A 12/27/2012   Procedure: ABDOMINAL Ronny Flurry;  Surgeon: Nada Libman, MD;  Location: [facility] CATH LAB;  Service: Cardiovascular;  Laterality: N/A;   ABDOMINAL AORTOGRAM W/LOWER EXTREMITY N/A 04/05/2017   Procedure: ABDOMINAL AORTOGRAM W/LOWER EXTREMITY;  Surgeon: Sherren Kerns, MD;  Location: [facility] INVASIVE CV LAB;  Service: Cardiovascular;  Laterality: N/A;   ABDOMINAL AORTOGRAM W/LOWER EXTREMITY N/A 09/30/2022   Procedure: ABDOMINAL AORTOGRAM W/LOWER EXTREMITY;  Surgeon: Victorino Sparrow, MD;  Location[facility] INVASIVE CV LAB;  Service: Cardiovascular;  Laterality: N/A;   AMPUTATION Right 04/26/2017   Procedure: AMPUTATION TRANSMETATARSAL RIGHT GREAT TOE;  Surgeon: Larina Earthly, MD;  Location: [facility] OR;  Service: Vascular;  Laterality: Right;   ANGIOPLASTY / STENTING FEMORAL Left 12/27/2012   APPLICATION OF WOUND VAC Right 04/26/2017   Procedure: APPLICATION OF WOUND VAC;  Surgeon: Larina Earthly, MD;  Location: [facility] OR;  Service: Vascular;  Laterality: Right;   FEMORAL ARTERY STENT  03/30/2012   LOWER EXTREMITY ANGIOGRAM  12/27/2012   Procedure: LOWER EXTREMITY ANGIOGRAM;  Surgeon: Nada Libman, MD;  Location: [facility] CATH LAB;  Service: Cardiovascular;;   LOWER EXTREMITY ANGIOGRAPHY N/A 04/19/2018   Procedure: LOWER EXTREMITY ANGIOGRAPHY;  Surgeon: Nada Libman, MD;  Location: [facility] INVASIVE CV LAB;  Service: Cardiovascular;  Laterality: N/A;   PERIPHERAL VASCULAR ATHERECTOMY Right 04/05/2017   Procedure: PERIPHERAL VASCULAR ATHERECTOMY;  Surgeon: Sherren Kerns, MD;  Location[facility] INVASIVE CV LAB;  Service: Cardiovascular;  Laterality: Right;  superficial femoral   PERIPHERAL VASCULAR BALLOON ANGIOPLASTY  04/19/2018    Procedure: PERIPHERAL VASCULAR BALLOON ANGIOPLASTY;  Surgeon: Nada Libman, MD;  Location: [facility] INVASIVE CV LAB;  Service: Cardiovascular;;   PERIPHERAL VASCULAR BALLOON ANGIOPLASTY Left 09/30/2022   Procedure: PERIPHERAL VASCULAR BALLOON ANGIOPLASTY;  Surgeon: Victorino Sparrow, MD;  Location: [facility] INVASIVE CV LAB;  Service: Cardiovascular;  Laterality: Left;  SFA, Pop, TP trunk & Rt Ext Iliac   PERIPHERAL VASCULAR INTERVENTION Right 04/05/2017   Procedure: PERIPHERAL VASCULAR INTERVENTION;  Surgeon: Sherren Kerns, MD;  Location: [facility] INVASIVE CV LAB;  Service: Cardiovascular;  Laterality: Right;   Superficial femorl and external iliac   POPLITEAL ARTERY STENT     TONSILLECTOMY AND ADENOIDECTOMY       Allergies  Allergen Reactions   Oxycodone Nausea And Vomiting   Glipizide     Bad headache and blurred vision  Other     Soft silicone dressing caused wound to get worse   Bactrim [Sulfamethoxazole-Trimethoprim] Itching and Rash      Family History  Problem Relation Age of Onset   Diabetes Sister        Bilateral amputation of lower legs   Diabetes Sister    Heart disease Sister 60       Heart disease before age 42   Hypertension Sister    Heart attack Sister    Hyperlipidemia Sister    Hypertension Father    Hyperlipidemia Father    Hyperlipidemia Mother    Hypertension Mother    Hypertension Daughter      Social History James Dougherty reports that he quit smoking about 11 years ago. His smoking use included cigarettes. He started smoking about 56 years ago. He has a 135 pack-year smoking history. He has never been exposed to tobacco smoke. He has never used smokeless tobacco. James Dougherty reports that he does not currently use alcohol.   Review of Systems CONSTITUTIONAL: No weight loss, fever, chills, weakness or fatigue.  HEENT: Eyes: No visual loss, blurred vision, double vision or yellow sclerae.No hearing loss, sneezing, congestion, runny nose or sore throat.  SKIN:  No rash or itching.  CARDIOVASCULAR: per hpi RESPIRATORY:per hpi GASTROINTESTINAL: No anorexia, nausea, vomiting or diarrhea. No abdominal pain or blood.  GENITOURINARY: No burning on urination, no polyuria NEUROLOGICAL: No headache, dizziness, syncope, paralysis, ataxia, numbness or tingling in the extremities. No change in bowel or bladder control.  MUSCULOSKELETAL: No muscle, back pain, joint pain or stiffness.  LYMPHATICS: No enlarged nodes. No history of splenectomy.  PSYCHIATRIC: No history of depression or anxiety.  ENDOCRINOLOGIC: No reports of sweating, cold or heat intolerance. No polyuria or polydipsia.  [name]   Physical Examination Today's Vitals   11/02/22 1601  BP: 118/75  Pulse: (!) 112  SpO2: 97%  Weight: 232 lb (105.2 kg)   Body mass index is 35.28 kg/m.  Gen: resting comfortably, no acute distress HEENT: no scleral icterus, pupils equal round and reactive, no palptable cervical adenopathy,  CV: RRR, no m/rg, no jvd Resp: Clear to auscultation bilaterally GI: abdomen is soft, non-tender, non-distended, normal bowel sounds, no hepatosplenomegaly MSK: extremities are warm, no edema.  Skin: warm, no rash Psych: appropriate affect   Diagnostic Studies 04/2021 nuclear stress  Findings are consistent with prior myocardial infarction with peri-infarct ischemia. The study is high risk.   No ST deviation was noted. Arrhythmias during stress: rare PACs, rare PVCs. The ECG was negative for ischemia.   LV perfusion is abnormal.  Small, moderate intensity, apical to basal anteroseptal defect that is fixed with the exception of partial reversibility at the very apex.  There is also a small, mild intensity inferior defect from apex to base that is most prominent on rest imaging and consistent with soft tissue attenuation, although cannot completely exclude component of scar.   Left ventricular function is abnormal. Global function is severely reduced. Nuclear stress EF: 23 %. End  diastolic cavity size is moderately enlarged. End systolic cavity size is moderately enlarged.   High risk study with calculated LVEF of 23% and moderate LV chamber dilatation.  Possibility of anteroseptal scar with mild apical ischemia raises possibility of ischemic cardiomyopathy.  Suggest echocardiogram for further evaluation of cardiac structure and function.    Assessment and Plan  1.Chronic systolic HF -side effects on toprol, tolerating bisoprolol - high K on both low dose losartan and separtely on aldactone,  discontinued - tolerating jardiance.  - he has turned down cath, also from family report due to SDH neurosurg has recommended against restarting plavix at any point but OK with aspirin alone. With recent bed sores, decreased renal function would also argue against cath.    - denies recent symptoms, continue current meds    EKG computer read is afib however on reviewe sinus tach with PACs   Antoine Poche, M.D.

## 2022-11-02 NOTE — Patient Instructions (Addendum)
Medication Instructions:  Continue all current medications.  Labwork: none  Testing/Procedures: none  Follow-Up: 3 months   Any Other Special Instructions Will Be Listed Below (If Applicable).  If you need a refill on your cardiac medications before your next appointment, please call your pharmacy.  

## 2022-11-10 ENCOUNTER — Telehealth: Payer: Self-pay

## 2022-11-10 NOTE — Telephone Encounter (Signed)
James Dougherty at [facility] in [place] called stating that the pt was still having a lot of leg pain.  Reviewed pt's chart, returned call for clarification, two identifiers used. She stated that the pt is c/o of burning pain and his feet are more flushed in appearance. Reviewed his last office visit note with her and Dr.Robins's assessment. Informed her that those symptoms were normal following an AGM and he's likely having some nerve pain. Asked her to talk to the provider in house to see about adjusting his Gabapentin for better symptom relief. She will call back if no improvement in the next few days. Confirmed understanding.

## 2022-11-18 DIAGNOSIS — I110 Hypertensive heart disease with heart failure: Secondary | ICD-10-CM | POA: Diagnosis not present

## 2022-11-18 DIAGNOSIS — I2510 Atherosclerotic heart disease of native coronary artery without angina pectoris: Secondary | ICD-10-CM | POA: Diagnosis not present

## 2022-11-18 DIAGNOSIS — Z794 Long term (current) use of insulin: Secondary | ICD-10-CM | POA: Diagnosis not present

## 2022-11-18 DIAGNOSIS — Z882 Allergy status to sulfonamides status: Secondary | ICD-10-CM | POA: Diagnosis not present

## 2022-11-18 DIAGNOSIS — M869 Osteomyelitis, unspecified: Secondary | ICD-10-CM | POA: Diagnosis not present

## 2022-11-18 DIAGNOSIS — L97529 Non-pressure chronic ulcer of other part of left foot with unspecified severity: Secondary | ICD-10-CM | POA: Diagnosis not present

## 2022-11-18 DIAGNOSIS — Z89421 Acquired absence of other right toe(s): Secondary | ICD-10-CM | POA: Diagnosis not present

## 2022-11-18 DIAGNOSIS — E1140 Type 2 diabetes mellitus with diabetic neuropathy, unspecified: Secondary | ICD-10-CM | POA: Diagnosis not present

## 2022-11-18 DIAGNOSIS — E1169 Type 2 diabetes mellitus with other specified complication: Secondary | ICD-10-CM | POA: Diagnosis not present

## 2022-11-18 DIAGNOSIS — E785 Hyperlipidemia, unspecified: Secondary | ICD-10-CM | POA: Diagnosis not present

## 2022-11-18 DIAGNOSIS — Z87891 Personal history of nicotine dependence: Secondary | ICD-10-CM | POA: Diagnosis not present

## 2022-11-18 DIAGNOSIS — I509 Heart failure, unspecified: Secondary | ICD-10-CM | POA: Diagnosis not present

## 2022-11-18 DIAGNOSIS — E11621 Type 2 diabetes mellitus with foot ulcer: Secondary | ICD-10-CM | POA: Diagnosis not present

## 2022-11-18 DIAGNOSIS — L97516 Non-pressure chronic ulcer of other part of right foot with bone involvement without evidence of necrosis: Secondary | ICD-10-CM | POA: Diagnosis not present

## 2022-11-18 DIAGNOSIS — I890 Lymphedema, not elsewhere classified: Secondary | ICD-10-CM | POA: Diagnosis not present

## 2022-11-18 DIAGNOSIS — E1151 Type 2 diabetes mellitus with diabetic peripheral angiopathy without gangrene: Secondary | ICD-10-CM | POA: Diagnosis not present

## 2022-11-18 DIAGNOSIS — Z89412 Acquired absence of left great toe: Secondary | ICD-10-CM | POA: Diagnosis not present

## 2022-11-20 ENCOUNTER — Telehealth: Payer: Self-pay

## 2022-11-20 NOTE — Telephone Encounter (Signed)
Sierra with [facility] called requesting an earlier appt d/t necrotic tissue on pt's L foot.  Worked with the [place] lab and [facility] to get [place] appts moved to 11/7.  Reviewed pt's chart, called and spoke with Herbert Seta at [facility], two identifiers used. Informed her of new appt dates and times. Added Dr. Karin Lieu appt to urgent wait list to hopefully have availability for that same day. Confirmed understanding.

## 2022-11-25 ENCOUNTER — Telehealth: Payer: Self-pay

## 2022-11-25 DIAGNOSIS — I110 Hypertensive heart disease with heart failure: Secondary | ICD-10-CM | POA: Diagnosis not present

## 2022-11-25 DIAGNOSIS — Z882 Allergy status to sulfonamides status: Secondary | ICD-10-CM | POA: Diagnosis not present

## 2022-11-25 DIAGNOSIS — E1140 Type 2 diabetes mellitus with diabetic neuropathy, unspecified: Secondary | ICD-10-CM | POA: Diagnosis not present

## 2022-11-25 DIAGNOSIS — M869 Osteomyelitis, unspecified: Secondary | ICD-10-CM | POA: Diagnosis not present

## 2022-11-25 DIAGNOSIS — I509 Heart failure, unspecified: Secondary | ICD-10-CM | POA: Diagnosis not present

## 2022-11-25 DIAGNOSIS — L97516 Non-pressure chronic ulcer of other part of right foot with bone involvement without evidence of necrosis: Secondary | ICD-10-CM | POA: Diagnosis not present

## 2022-11-25 DIAGNOSIS — Z89412 Acquired absence of left great toe: Secondary | ICD-10-CM | POA: Diagnosis not present

## 2022-11-25 DIAGNOSIS — E1169 Type 2 diabetes mellitus with other specified complication: Secondary | ICD-10-CM | POA: Diagnosis not present

## 2022-11-25 DIAGNOSIS — E1151 Type 2 diabetes mellitus with diabetic peripheral angiopathy without gangrene: Secondary | ICD-10-CM | POA: Diagnosis not present

## 2022-11-25 DIAGNOSIS — Z87891 Personal history of nicotine dependence: Secondary | ICD-10-CM | POA: Diagnosis not present

## 2022-11-25 DIAGNOSIS — Z794 Long term (current) use of insulin: Secondary | ICD-10-CM | POA: Diagnosis not present

## 2022-11-25 DIAGNOSIS — Z89421 Acquired absence of other right toe(s): Secondary | ICD-10-CM | POA: Diagnosis not present

## 2022-11-25 DIAGNOSIS — E11621 Type 2 diabetes mellitus with foot ulcer: Secondary | ICD-10-CM | POA: Diagnosis not present

## 2022-11-25 DIAGNOSIS — I2510 Atherosclerotic heart disease of native coronary artery without angina pectoris: Secondary | ICD-10-CM | POA: Diagnosis not present

## 2022-11-25 DIAGNOSIS — E785 Hyperlipidemia, unspecified: Secondary | ICD-10-CM | POA: Diagnosis not present

## 2022-11-25 NOTE — Telephone Encounter (Signed)
Caller: Dr. Arlyn Dunning, Lupita Leash, RN  Concern: Pt seen in Dr. Novella Rob office today. Last week when she saw him, she noted that the L great toe amp site was black. Today, she debrided more necrotic tissue and there is exposed bone. His other foot wound on the lateral aspect has worsened as well.   Location: L foot  Resolution: Appointment scheduled for first available triage appt  Next Appt: Appointment scheduled for 10/31 @ 1200 for [place] and 1300 for [place]

## 2022-11-26 ENCOUNTER — Encounter: Payer: Self-pay | Admitting: Physician Assistant

## 2022-11-26 ENCOUNTER — Encounter (HOSPITAL_COMMUNITY): Payer: Medicare Other

## 2022-11-26 ENCOUNTER — Ambulatory Visit (INDEPENDENT_AMBULATORY_CARE_PROVIDER_SITE_OTHER)
Admission: RE | Admit: 2022-11-26 | Discharge: 2022-11-26 | Disposition: A | Discharge status: Home / Self Care | Payer: Medicare Other | Source: Ambulatory Visit | Attending: Vascular Surgery | Admitting: Vascular Surgery

## 2022-11-26 ENCOUNTER — Ambulatory Visit (INDEPENDENT_AMBULATORY_CARE_PROVIDER_SITE_OTHER): Payer: Medicare Other | Admitting: Physician Assistant

## 2022-11-26 ENCOUNTER — Ambulatory Visit (HOSPITAL_COMMUNITY)
Admission: RE | Admit: 2022-11-26 | Discharge: 2022-11-26 | Disposition: A | Discharge status: Home / Self Care | Payer: Medicare Other | Source: Ambulatory Visit | Attending: Vascular Surgery | Admitting: Vascular Surgery

## 2022-11-26 VITALS — BP 118/70 | HR 71 | Temp 98.0°F

## 2022-11-26 DIAGNOSIS — L97929 Non-pressure chronic ulcer of unspecified part of left lower leg with unspecified severity: Secondary | ICD-10-CM | POA: Diagnosis not present

## 2022-11-26 DIAGNOSIS — I739 Peripheral vascular disease, unspecified: Secondary | ICD-10-CM | POA: Insufficient documentation

## 2022-11-26 DIAGNOSIS — I70248 Atherosclerosis of native arteries of left leg with ulceration of other part of lower left leg: Secondary | ICD-10-CM

## 2022-11-26 DIAGNOSIS — I70262 Atherosclerosis of native arteries of extremities with gangrene, left leg: Secondary | ICD-10-CM | POA: Diagnosis not present

## 2022-11-26 LAB — VAS US ABI WITH/WO TBI: Left ABI: ABSENT

## 2022-11-26 NOTE — Progress Notes (Signed)
Office Note     CC:  follow up Requesting Provider:  [name], MD  HPI: James Dougherty is a 72 y.o. (15-Jun-1950) male who presents as an urgent add-on to clinic due to worsening tissue loss of the left foot.  He recently underwent  DCB SFA, lithotripsy popliteal artery, PTX tibioperoneal trunk, DCB right external iliac artery by Dr. Karin Lieu on 10/20/2022.  Unfortunately he has developed necrotic wounds on the foot and the great toe amputation site as well as the lateral foot position.  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  Patient states he walks with the help of the therapy team with a walker however admittedly is not very many steps prior to having to stop and sit.  He denies fevers, chills, nausea/vomiting.  It should also be noted he has a known critical right ICA stenosis.  This remains asymptomatic.  Given his multiple comorbidities and current clinical state, the patient and his family as well as Dr. Karin Lieu have agreed to manage this conservatively.   Past Medical History:  Diagnosis Date   Arthritis    "hands" (12/27/2012)   High cholesterol    Hypertension    Neuropathic pain    PAD (peripheral artery disease) (HCC)    Psoriasis    Stroke (HCC) 1999   "still have some speech problems at times; sometimes forget what I was going to say" (12/27/2012)   Type II diabetes mellitus (HCC)    Ulcer    Left ankle/leg    Past Surgical History:  Procedure Laterality Date   ABDOMINAL AORTAGRAM N/A 03/30/2012   Procedure: ABDOMINAL Ronny Flurry;  Surgeon: Nada Libman, MD;  Location: [facility] CATH LAB;  Service: Cardiovascular;  Laterality: N/A;   ABDOMINAL AORTAGRAM N/A 12/27/2012   Procedure: ABDOMINAL Ronny Flurry;  Surgeon: Nada Libman, MD;  Location: [facility] CATH LAB;  Service: Cardiovascular;  Laterality: N/A;   ABDOMINAL AORTOGRAM W/LOWER EXTREMITY N/A 04/05/2017   Procedure: ABDOMINAL AORTOGRAM W/LOWER EXTREMITY;  Surgeon: Sherren Kerns, MD;   Location: [facility] INVASIVE CV LAB;  Service: Cardiovascular;  Laterality: N/A;   ABDOMINAL AORTOGRAM W/LOWER EXTREMITY N/A 09/30/2022   Procedure: ABDOMINAL AORTOGRAM W/LOWER EXTREMITY;  Surgeon: Victorino Sparrow, MD;  Location: [facility] INVASIVE CV LAB;  Service: Cardiovascular;  Laterality: N/A;   AMPUTATION Right 04/26/2017   Procedure: AMPUTATION TRANSMETATARSAL RIGHT GREAT TOE;  Surgeon: Larina Earthly, MD;  Location: [facility] OR;  Service: Vascular;  Laterality: Right;   ANGIOPLASTY / STENTING FEMORAL Left 12/27/2012   APPLICATION OF WOUND VAC Right 04/26/2017   Procedure: APPLICATION OF WOUND VAC;  Surgeon: Larina Earthly, MD;  Location: [facility] OR;  Service: Vascular;  Laterality: Right;   FEMORAL ARTERY STENT  03/30/2012   LOWER EXTREMITY ANGIOGRAM  12/27/2012   Procedure: LOWER EXTREMITY ANGIOGRAM;  Surgeon: Nada Libman, MD;  Location: [facility] CATH LAB;  Service: Cardiovascular;;   LOWER EXTREMITY ANGIOGRAPHY N/A 04/19/2018   Procedure: LOWER EXTREMITY ANGIOGRAPHY;  Surgeon: Nada Libman, MD;  Location: [facility] INVASIVE CV LAB;  Service: Cardiovascular;  Laterality: N/A;   PERIPHERAL VASCULAR ATHERECTOMY Right 04/05/2017   Procedure: PERIPHERAL VASCULAR ATHERECTOMY;  Surgeon: Sherren Kerns, MD;  Location: [facility] INVASIVE CV LAB;  Service: Cardiovascular;  Laterality: Right;  superficial femoral   PERIPHERAL VASCULAR BALLOON ANGIOPLASTY  04/19/2018   Procedure: PERIPHERAL VASCULAR BALLOON ANGIOPLASTY;  Surgeon: Nada Libman, MD;  Location: [facility] INVASIVE CV LAB;  Service: Cardiovascular;;   PERIPHERAL VASCULAR  BALLOON ANGIOPLASTY Left 09/30/2022   Procedure: PERIPHERAL VASCULAR BALLOON ANGIOPLASTY;  Surgeon: Victorino Sparrow, MD;  Location: [facility] INVASIVE CV LAB;  Service: Cardiovascular;  Laterality: Left;  SFA, Pop, TP trunk & Rt Ext Iliac   PERIPHERAL VASCULAR INTERVENTION Right 04/05/2017   Procedure: PERIPHERAL VASCULAR INTERVENTION;  Surgeon: Sherren Kerns, MD;  Location: [facility] INVASIVE CV LAB;  Service: Cardiovascular;   Laterality: Right;   Superficial femorl and external iliac   POPLITEAL ARTERY STENT     TONSILLECTOMY AND ADENOIDECTOMY      Social History   Socioeconomic History   Marital status: Single    Spouse name: Not on file   Number of children: Not on file   Years of education: Not on file   Highest education level: Not on file  Occupational History   Not on file  Tobacco Use   Smoking status: Former    Current packs/day: 0.00    Average packs/day: 3.0 packs/day for 45.0 years (135.0 ttl pk-yrs)    Types: Cigarettes    Start date: 01/06/1966    Quit date: 01/07/2011    Years since quitting: 11.8    Passive exposure: Never   Smokeless tobacco: Never  Vaping Use   Vaping status: Never Used  Substance and Sexual Activity   Alcohol use: Not Currently    Comment: 12/27/2012 "couple times/yr I might have a drink"   Drug use: No   Sexual activity: Never  Other Topics Concern   Not on file  Social History Narrative   Not on file   Social Determinants of Health   Financial Resource Strain: Patient Declined (10/20/2022)   Received from [facility]   Overall Financial Resource Strain (CARDIA)    Difficulty of Paying Living Expenses: Patient declined  Food Insecurity: Patient Declined (10/20/2022)   Received from [facility]   Hunger Vital Sign    Worried About Running Out of Food in the Last Year: Patient declined    Ran Out of Food in the Last Year: Patient declined  Transportation Needs: Patient Declined (10/20/2022)   Received from [facility]   PRAPARE - Transportation    Lack of Transportation (Medical): Patient declined    Lack of Transportation (Non-Medical): Patient declined  Physical Activity: Inactive (10/20/2022)   Received from [facility]   Exercise Vital Sign    Days of Exercise per Week: 0 days    Minutes of Exercise per Session: 0 min  Stress: No Stress Concern Present (10/20/2022)   Received from [facility] - Occupational Stress Questionnaire    Feeling of Stress : Not at all  Social Connections: Patient Declined (10/20/2022)   Received from [facility]   Social Connection and Isolation Panel [NHANES]    Frequency of Communication with Friends and Family: Patient declined    Frequency of Social Gatherings with Friends and Family: Patient declined    Attends Religious Services: Patient declined    Database administrator or Organizations: Patient declined    Attends Banker Meetings: Patient declined    Marital Status: Patient declined  Intimate Partner Violence: Patient Declined (10/20/2022)   Received from [facility]   Humiliation, Afraid, Rape, and Kick questionnaire    Fear of Current or Ex-Partner: Patient declined    Emotionally Abused: Patient declined    Physically Abused: Patient declined    Sexually Abused: Patient declined    Family History  Problem Relation Age of Onset   Diabetes Sister        Bilateral amputation of lower legs   Diabetes Sister    Heart disease Sister 68       Heart disease before age 2   Hypertension Sister    Heart attack Sister    Hyperlipidemia Sister    Hypertension Father    Hyperlipidemia Father    Hyperlipidemia Mother    Hypertension Mother    Hypertension Daughter     Current Outpatient Medications  Medication Sig Dispense Refill   aspirin EC 81 MG tablet Take 1 tablet (81 mg total) by mouth daily. Swallow whole.     atorvastatin (LIPITOR) 20 MG tablet Take 1 tablet (20 mg total) by mouth at bedtime. Too soon to refill 30 tablet 0   bisacodyl (DULCOLAX) 5 MG EC tablet Take 10 mg by mouth daily as needed for moderate constipation.     bisoprolol (ZEBETA) 5 MG tablet Take 0.5 tablets (2.5 mg total) by mouth daily. 30 tablet 0   clopidogrel (PLAVIX) 75 MG tablet Take 1 tablet (75 mg total) by mouth daily. 30 tablet 11   Dulaglutide (TRULICITY) 1.5 MG/0.5ML SOPN Inject 1.5 mg into the skin every Saturday.      empagliflozin (JARDIANCE) 10 MG TABS tablet Take 1 tablet (10 mg total) by mouth daily. 90 tablet 3   furosemide (LASIX) 20 MG tablet Take 1 tablet (20 mg total) by mouth daily. (Patient taking differently: Take 40 mg by mouth daily.) 30 tablet 0   gabapentin (NEURONTIN) 600 MG tablet Take 1 tablet (600 mg total) by mouth every evening. 30 tablet 0   insulin detemir (LEVEMIR) 100 UNIT/ML injection Inject 21 Units into the skin at bedtime.     metoprolol succinate (TOPROL-XL) 50 MG 24 hr tablet Take 50 mg by mouth daily. Take with or immediately following a meal.     Podiatric Products (GOLD BOND FOOT) CREA Apply 1 application  topically 2 (two) times daily as needed (DRY SKIN). DIABETIC CREAM     Povidone-Iodine (BETADINE EX) Apply 1 Application topically 3 (three) times a week.     No current facility-administered medications for this visit.    Allergies  Allergen Reactions   Oxycodone Nausea And Vomiting   Glipizide     Bad headache and blurred vision   Other     Soft silicone dressing caused wound to get worse   Bactrim [Sulfamethoxazole-Trimethoprim] Itching and Rash     REVIEW OF SYSTEMS:   [X]  denotes positive finding, [ ]  denotes negative finding Cardiac  Comments:  Chest pain or chest pressure:    Shortness of breath upon exertion:    Short of breath when lying flat:    Irregular heart rhythm:        Vascular    Pain in calf, thigh, or hip brought on by ambulation:    Pain in feet at night that wakes you up from your sleep:     Blood clot in your veins:    Leg swelling:         Pulmonary    Oxygen at home:    Productive cough:     Wheezing:         Neurologic    Sudden weakness in arms or legs:     Sudden numbness in arms or legs:     Sudden onset of difficulty speaking or slurred speech:    Temporary loss of vision in one eye:  Problems with dizziness:         Gastrointestinal    Blood in stool:     Vomited blood:         Genitourinary    Burning when  urinating:     Blood in urine:        Psychiatric    Major depression:         Hematologic    Bleeding problems:    Problems with blood clotting too easily:        Skin    Rashes or ulcers:        Constitutional    Fever or chills:      PHYSICAL EXAMINATION:  Vitals:   11/26/22 1137  BP: 118/70  Pulse: 71  Temp: 98 F (36.7 C)  TempSrc: Temporal  SpO2: 96%    General:  WDWN in NAD; vital signs documented above Gait: Not observed HENT: WNL, normocephalic Pulmonary: normal non-labored breathing , without Rales, rhonchi,  wheezing Cardiac: regular HR Abdomen: soft, NT, no masses Skin: without rashes Vascular Exam/Pulses: Venous leg left PT signal Extremities: Necrotic great toe amp site with exposed bone as well as necrotic lateral left foot wound Musculoskeletal: no muscle wasting or atrophy  Neurologic: A&O X 3 Psychiatric:  The pt has Normal affect.   Non-Invasive Vascular Imaging:   SFA stent patent with monophasic sluggish flow Left ABI of 0  ASSESSMENT/PLAN:: 72 y.o. male who returns to clinic as an urgent add-on due to worsening tissue loss of the left foot  Mr. Tilghman Aquilar is a 72 year old man who currently resides in a nursing facility.  He is minimally ambulatory with a walker requiring 1 person assist.  He underwent extensive endovascular revascularization of left lower extremity last month.  Unfortunately he now has extensive tissue loss of left foot.  He even with improved blood flow to the left foot he will not be able to recover from these wounds due to the severity of tissue loss.  He was also evaluated by Dr. Karin Lieu today.  If willing, the patient will require a left above-the-knee amputation.  Dr. Karin Lieu will discuss this with his daughter over the phone.  If agreeable we will schedule this for the near future.   Emilie Rutter, PA-C [facility] [phone]  Clinic MD:   Karin Lieu

## 2022-11-27 DIAGNOSIS — E1122 Type 2 diabetes mellitus with diabetic chronic kidney disease: Secondary | ICD-10-CM | POA: Diagnosis not present

## 2022-11-27 DIAGNOSIS — I10 Essential (primary) hypertension: Secondary | ICD-10-CM | POA: Diagnosis not present

## 2022-11-27 DIAGNOSIS — M869 Osteomyelitis, unspecified: Secondary | ICD-10-CM | POA: Diagnosis not present

## 2022-12-02 ENCOUNTER — Telehealth: Payer: Self-pay

## 2022-12-02 DIAGNOSIS — I70222 Atherosclerosis of native arteries of extremities with rest pain, left leg: Secondary | ICD-10-CM | POA: Diagnosis not present

## 2022-12-02 DIAGNOSIS — Z89412 Acquired absence of left great toe: Secondary | ICD-10-CM | POA: Diagnosis not present

## 2022-12-02 DIAGNOSIS — E119 Type 2 diabetes mellitus without complications: Secondary | ICD-10-CM | POA: Diagnosis not present

## 2022-12-02 DIAGNOSIS — Z7984 Long term (current) use of oral hypoglycemic drugs: Secondary | ICD-10-CM | POA: Diagnosis not present

## 2022-12-02 DIAGNOSIS — Z794 Long term (current) use of insulin: Secondary | ICD-10-CM | POA: Diagnosis not present

## 2022-12-02 DIAGNOSIS — I110 Hypertensive heart disease with heart failure: Secondary | ICD-10-CM | POA: Diagnosis not present

## 2022-12-02 DIAGNOSIS — I509 Heart failure, unspecified: Secondary | ICD-10-CM | POA: Diagnosis not present

## 2022-12-02 NOTE — Telephone Encounter (Signed)
Contacted Rosanne Sack., nurse at wound care center and informed of providers response. She voiced understanding and stated will update her provider with this information.

## 2022-12-02 NOTE — Telephone Encounter (Signed)
-----   Message from Victorino Sparrow sent at 12/02/2022 11:53 AM EST ----- Regarding: RE: Please advise He and his daughter were offered amp - they said they would call when ready ----- Message ----- From: Primitivo Gauze, RN Sent: 12/02/2022  11:03 AM EST To: Victorino Sparrow, MD Subject: Please advise                                  Patient was seen on 10/31 and per note, "If willing, the patient will require a left above-the-knee amputation.  Dr. Karin Lieu will discuss this with his daughter over the phone.  If agreeable we will schedule this for the near future." [facility] called to inquire about amputation plans. Can you please advise. Thanks  Yahoo

## 2022-12-03 ENCOUNTER — Ambulatory Visit (HOSPITAL_COMMUNITY): Admission: RE | Admit: 2022-12-03 | Payer: Medicare Other | Source: Ambulatory Visit

## 2022-12-03 ENCOUNTER — Ambulatory Visit (HOSPITAL_COMMUNITY): Payer: Medicare Other

## 2022-12-03 ENCOUNTER — Encounter (HOSPITAL_COMMUNITY): Payer: Self-pay

## 2022-12-03 ENCOUNTER — Other Ambulatory Visit: Payer: Self-pay

## 2022-12-03 DIAGNOSIS — I70245 Atherosclerosis of native arteries of left leg with ulceration of other part of foot: Secondary | ICD-10-CM

## 2022-12-03 NOTE — Telephone Encounter (Signed)
Received a call from patient's daughter, Carlton Adam requesting to proceed with scheduling patient for a left AKA. Scheduled patient for 11/15. Instructions provided. She voiced understanding. Spoke with Makayla at [facility] to also provide her with surgery information. Instructions will be faxed to facility. She voiced understanding.

## 2022-12-09 ENCOUNTER — Other Ambulatory Visit: Payer: Self-pay

## 2022-12-09 ENCOUNTER — Encounter (HOSPITAL_COMMUNITY): Payer: Self-pay | Admitting: Vascular Surgery

## 2022-12-09 NOTE — Progress Notes (Addendum)
Surgical Instructions for James Dougherty. [name]    Your procedure is scheduled on Friday, 12/11/22.  Report to [facility] Main Entrance "A" at 10:15 A.M., then check in with the Admitting office.  Call this number if you have problems the morning of surgery:  [phone]   If you have any questions prior to your surgery date call [phone]: Open Monday-Friday 8am-4pm If you experience any cold or flu symptoms such as cough, fever, chills, shortness of breath, etc. between now and your scheduled surgery, please notify us at the above number     Remember:  Do not eat or drink after midnight the night before your surgery-Thursday     Take these medicines the morning of surgery with A SIP OF WATER:  Aspirin 81 mg DULoxetine (CYMBALTA)  gabapentin (NEURONTIN)  metoprolol succinate (TOPROL-XL  traMADol (ULTRAM) if needed   As of today, STOP taking any Aleve, Naproxen, Ibuprofen, Motrin, Advil, Goody's, BC's, all herbal medications, fish oil, and all vitamins.           Do not wear jewelry  Do not wear lotions, powders, cologne or deodorant. Men may shave face and neck. Do not bring valuables to the hospital.   [facility] is not responsible for any belongings or valuables.    Do NOT Smoke (Tobacco/Vaping)  24 hours prior to your procedure   Contacts, glasses, hearing aids, dentures or partials may not be worn into surgery, please bring cases for these belongings   For patients admitted to the hospital, discharge time will be determined by your treatment team.    SURGICAL WAITING ROOM VISITATION Patients having surgery or a procedure may have no more than 2 support people in the waiting area - these visitors may rotate.   Children under the age of 40 must have an adult with them who is not the patient. If the patient needs to stay at the hospital during part of their recovery, the visitor guidelines for inpatient rooms apply. Pre-op nurse will coordinate an appropriate time for  1 support person to accompany patient in pre-op.  This support person may not rotate.   Please refer to [email or web address] for the visitor guidelines for Inpatients (after your surgery is over and you are in a regular room).    Special instructions:    Oral Hygiene is also important to reduce your risk of infection.  Remember - BRUSH YOUR TEETH THE MORNING OF SURGERY WITH YOUR REGULAR TOOTHPASTE

## 2022-12-09 NOTE — Anesthesia Preprocedure Evaluation (Addendum)
Anesthesia Evaluation  Patient identified by MRN, date of birth, ID band Patient awake    Reviewed: Allergy & Precautions, NPO status , Patient's Chart, lab work & pertinent test results, reviewed documented beta blocker date and time   Airway Mallampati: II  TM Distance: >3 FB Neck ROM: Full    Dental  (+) Teeth Intact, Dental Advisory Given   Pulmonary former smoker   Pulmonary exam normal breath sounds clear to auscultation       Cardiovascular hypertension, Pt. on home beta blockers + Peripheral Vascular Disease (Critical limb ischemia of left lower extremity with ulceration of foot)  Normal cardiovascular exam Rhythm:Regular Rate:Normal     Neuro/Psych  PSYCHIATRIC DISORDERS  Depression     Neuromuscular disease CVA    GI/Hepatic negative GI ROS, Neg liver ROS,,,  Endo/Other  diabetes, Type 2, Oral Hypoglycemic Agents, Insulin Dependent  Class 3 obesity  Renal/GU Renal InsufficiencyRenal disease     Musculoskeletal  (+) Arthritis ,    Abdominal   Peds  Hematology  (+) Blood dyscrasia (Plavix)   Anesthesia Other Findings Day of surgery medications reviewed with the patient.  Reproductive/Obstetrics                              Anesthesia Physical Anesthesia Plan  ASA: 4  Anesthesia Plan: General   Post-op Pain Management: Tylenol PO (pre-op)*   Induction: Intravenous  PONV Risk Score and Plan: 2 and Dexamethasone and Ondansetron  Airway Management Planned: Oral ETT  Additional Equipment:   Intra-op Plan:   Post-operative Plan: Extubation in OR  Informed Consent: I have reviewed the patients History and Physical, chart, labs and discussed the procedure including the risks, benefits and alternatives for the proposed anesthesia with the patient or authorized representative who has indicated his/her understanding and acceptance.     Dental advisory given  Plan Discussed  with: CRNA  Anesthesia Plan Comments: (PAT note by James Poles, PA-C: Follows with cardiology for hx of HFrEF, initially discovered on preop workup for proposed TCAR. 04/2021 nuclear stress: apical to basal anteroseptal scar with very mild ishcemia toward apex. Likely inferior artifact but cannot exclude component of scar. LVEF 23% - 05/2021 echo: poor visualization, LVEF roughly 30-40%. Grade III dd.  - 06/2021 echo with contrast: LVEF 35%, global hypokiensis.  Last seen by James Dougherty on 11/02/2022.  Per note, "he has turned down cath, also from family report due to [facility] neurosurg has recommended against restarting plavix at any point but OK with aspirin alone. With recent bed sores, decreased renal function would also argue against cath."  Follows with vascular surgery for history of PAD s/p right SFA atherectomy, DCB, stent 03/2017; right SFA PTX 03/2018; DCB SFA, lithotripsy popliteal artery, PTX tibioperoneal trunk, DCB right external iliac artery 09/2022.  He has known bilateral asymptomatic high-grade carotid stenosis, however, no plans for intervention given advanced comorbidities and poor functional status.  Currently has worsening tissue loss of the left foot.  Per note by James Rutter, PA-C 11/26/2022, "[name] is a 72 year old man who currently resides in a nursing facility.  He is minimally ambulatory with a walker requiring 1 person assist.  He underwent extensive endovascular revascularization of left lower extremity last month.  Unfortunately he now has extensive tissue loss of left foot.  He even with improved blood flow to the left foot he will not be able to recover from these wounds due to the severity  of tissue loss.  He was also evaluated by James Dougherty today.  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   IDDM2. On once weekly GLP1a dulaglutide.   Pt will  need DOS labs and eval.  EKG 11/02/22: Read per James Dougherty note same date:  "computer read is afib however on reviewe sinus tach with PACs"  Carotid duplex 08/28/22: Summary:  Right Carotid: Velocities in the right ICA are consistent with a 80-99%                 Left Carotid: Velocities in the left ICA are consistent with a 40-59% stenosis.   Vertebrals: Bilateral vertebral arteries demonstrate antegrade flow.   Limited echo 07/11/21: 1. Limited exam.   2. Left ventricular ejection fraction, by estimation, is 35%. The left  ventricle has moderately decreased function. The left ventricle  demonstrates global hypokinesis.   3. Right ventricular systolic function is low normal. The right  ventricular size is normal.   TTE 06/18/21: 1. Endocardium poorly visualized,difficult assessment of LV function and  wall motion. LVEF roughly estimated around 30-40%. Recommend limited study  with echocontrast to better visualize. [name] Left ventricular ejection  fraction, by estimation, is 30-40%.  The left ventricle has mild to moderately decreased function. Left  ventricular endocardial border not optimally defined to evaluate regional  wall motion. There is mild left ventricular hypertrophy. Left ventricular  diastolic parameters are consistent  with Grade III diastolic dysfunction (restrictive). Elevated left atrial  pressure.   2. Right ventricular systolic function is normal. The right ventricular  size is normal.   3. Left atrial size was mildly dilated.   4. The mitral valve was not well visualized. Mild mitral valve  regurgitation. No evidence of mitral stenosis.   5. The tricuspid valve is abnormal.   6. The aortic valve has an indeterminant number of cusps. Aortic valve  regurgitation is not visualized. No aortic stenosis is present.   7. The inferior vena cava is normal in size with greater than 50%  respiratory variability, suggesting right atrial pressure of 3 mmHg.   Comparison(s):  Nuclear stress test donw 05/23/21 showed an EF od 23%.   Nuclear stress 05/23/21:    Findings are consistent with prior myocardial infarction with peri-infarct ischemia. The study is high risk.   No ST deviation was noted. Arrhythmias during stress: rare PACs, rare PVCs. The ECG was negative for ischemia.   LV perfusion is abnormal.  Small, moderate intensity, apical to basal anteroseptal defect that is fixed with the exception of partial reversibility at the very apex.  There is also a small, mild intensity inferior defect from apex to base that is most prominent on rest imaging and consistent with soft tissue attenuation, although cannot completely exclude component of scar.   Left ventricular function is abnormal. Global function is severely reduced. Nuclear stress EF: 23 %. End diastolic cavity size is moderately enlarged. End systolic cavity size is moderately enlarged.   High risk study with calculated LVEF of 23% and moderate LV chamber dilatation.  Possibility of anteroseptal scar with mild apical ischemia raises possibility of ischemic cardiomyopathy.  Suggest echocardiogram for further evaluation of cardiac structure and function.   )         Anesthesia Quick Evaluation

## 2022-12-09 NOTE — Progress Notes (Signed)
Received phone call from Kia at [facility] regarding ASA.  When patient was seen in MD's office on 11/26/22, ASA 81 mg was listed as one of his medications.  However, when I worked up the patient's chart today, ASA was not listed under medications.  Per MD instructions, patient to take ASA 81 mg on DOS. Facility informed.

## 2022-12-09 NOTE — Progress Notes (Signed)
Patient resides at [facility], [employer] (phone [phone]).  Spoke with patient's nurse Ladona Ridgel, LPN for information and instructions for DOS.  PCP - Dr Fara Chute Cardiologist - Dr Dina Rich  Chest x-ray - 10/20/22 CE EKG - 10/20/22 CE - Requested Stress Test - 05/23/21 ECHO Limited - 07/11/21 Cardiac Cath - n/a  ICD Pacemaker/Loop - n/a  Sleep Study -  n/a  Diabetes Type 2 Fasting Blood Sugar - 140-210's  Last dose of Trulicity was on 11/28/22 and facility d/c med from med list.  I added Trulicity back to med list with last dose taken on 11/28/22.  MD's instructions were last dose on 11/28/22.  Hold Jardiance 72 hours prior to procedure.  Last dose was on 12/09/22.  THE NIGHT BEFORE SURGERY, take 10 Units of Levemir Insulin.        If your blood sugar is less than 70 mg/dL, you will need to treat for low blood sugar: Treat a low blood sugar (less than 70 mg/dL) with  cup of clear juice (cranberry or apple), 4 glucose tablets, OR glucose gel. Recheck blood sugar in 15 minutes after treatment (to make sure it is greater than 70 mg/dL). If your blood sugar is not greater than 70 mg/dL on recheck, call [phone] for further instructions.  Blood Thinner Instructions:  Stop Plavix 5 days prior to procedure per MD.  Last dose was on 12/04/22  Aspirin Instructions: Continue Aspirin 81 mg per MD.  Aspirin was not on patient's med record.  Verified with patient's nurse Ladona Ridgel, LPN.  Called MD to see if they want patient to start ASA.  Will let facility know once I have an answer from MD.    NPO  Anesthesia review: Yes  STOP now taking any Aspirin (unless otherwise instructed by your surgeon), Aleve, Naproxen, Ibuprofen, Motrin, Advil, Goody's, BC's, all herbal medications, fish oil, and all vitamins.   Coronavirus Screening Does the patient have any of the following symptoms:  Cough yes/no: No Fever (>100.63F)  yes/no: No Runny nose yes/no: No Sore throat yes/no:  No Difficulty breathing/shortness of breath  yes/no: No  Has the patient traveled in the last 14 days and where? yes/no: No  Patient's nurse Ladona Ridgel, LPN verbalized understanding of instructions that were given via phone.  Instructions were also faxed to [facility], LPN at [phone].

## 2022-12-09 NOTE — Progress Notes (Signed)
Anesthesia Chart Review: Same day workup  Follows with cardiology for hx of HFrEF, initially discovered on preop workup for proposed TCAR. 04/2021 nuclear stress: apical to basal anteroseptal scar with very mild ishcemia toward apex. Likely inferior artifact but cannot exclude component of scar. LVEF 23% - 05/2021 echo: poor visualization, LVEF roughly 30-40%. Grade III dd.  - 06/2021 echo with contrast: LVEF 35%, global hypokiensis.  Last seen by Dr. Wyline Dougherty on 11/02/2022.  Per note, "he has turned down cath, also from family report due to [facility] neurosurg has recommended against restarting plavix at any point but OK with aspirin alone. With recent bed sores, decreased renal function would also argue against cath."  Follows with vascular surgery for history of PAD s/p right SFA atherectomy, DCB, stent 03/2017; right SFA PTX 03/2018; DCB SFA, lithotripsy popliteal artery, PTX tibioperoneal trunk, DCB right external iliac artery 09/2022.  He has known bilateral asymptomatic high-grade carotid stenosis, however, no plans for intervention given advanced comorbidities and poor functional status.  Currently has worsening tissue loss of the left foot.  Per note by James Rutter, PA-C 11/26/2022, "[name] is a 72 year old man who currently resides in a nursing facility.  He is minimally ambulatory with a walker requiring 1 person assist.  He underwent extensive endovascular revascularization of left lower extremity last month.  Unfortunately he now has extensive tissue loss of left foot.  He even with improved blood flow to the left foot he will not be able to recover from these wounds due to the severity of tissue loss.  He was also evaluated by Dr. Karin Dougherty today.  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   IDDM2. On once weekly GLP1a dulaglutide.   Pt will need DOS labs and eval.  EKG  11/02/22: Read per Dr. Verna Dougherty note same date:  "computer read is afib however on reviewe sinus tach with PACs"  Carotid duplex 08/28/22: Summary:  Right Carotid: Velocities in the right ICA are consistent with a 80-99%                 Left Carotid: Velocities in the left ICA are consistent with a 40-59% stenosis.   Vertebrals: Bilateral vertebral arteries demonstrate antegrade flow.   Limited echo 07/11/21: 1. Limited exam.   2. Left ventricular ejection fraction, by estimation, is 35%. The left  ventricle has moderately decreased function. The left ventricle  demonstrates global hypokinesis.   3. Right ventricular systolic function is low normal. The right  ventricular size is normal.   TTE 06/18/21: 1. Endocardium poorly visualized,difficult assessment of LV function and  wall motion. LVEF roughly estimated around 30-40%. Recommend limited study  with echocontrast to better visualize. [name] Left ventricular ejection  fraction, by estimation, is 30-40%.  The left ventricle has mild to moderately decreased function. Left  ventricular endocardial border not optimally defined to evaluate regional  wall motion. There is mild left ventricular hypertrophy. Left ventricular  diastolic parameters are consistent  with Grade III diastolic dysfunction (restrictive). Elevated left atrial  pressure.   2. Right ventricular systolic function is normal. The right ventricular  size is normal.   3. Left atrial size was mildly dilated.   4. The mitral valve was not well visualized. Mild mitral valve  regurgitation. No evidence of mitral stenosis.   5. The tricuspid valve is abnormal.   6. The aortic valve has  an indeterminant number of cusps. Aortic valve  regurgitation is not visualized. No aortic stenosis is present.   7. The inferior vena cava is normal in size with greater than 50%  respiratory variability, suggesting right atrial pressure of 3 mmHg.   Comparison(s): Nuclear stress test donw  05/23/21 showed an EF od 23%.   Nuclear stress 05/23/21:    Findings are consistent with prior myocardial infarction with peri-infarct ischemia. The study is high risk.   No ST deviation was noted. Arrhythmias during stress: rare PACs, rare PVCs. The ECG was negative for ischemia.   LV perfusion is abnormal.  Small, moderate intensity, apical to basal anteroseptal defect that is fixed with the exception of partial reversibility at the very apex.  There is also a small, mild intensity inferior defect from apex to base that is most prominent on rest imaging and consistent with soft tissue attenuation, although cannot completely exclude component of scar.   Left ventricular function is abnormal. Global function is severely reduced. Nuclear stress EF: 23 %. End diastolic cavity size is moderately enlarged. End systolic cavity size is moderately enlarged.   High risk study with calculated LVEF of 23% and moderate LV chamber dilatation.  Possibility of anteroseptal scar with mild apical ischemia raises possibility of ischemic cardiomyopathy.  Suggest echocardiogram for further evaluation of cardiac structure and function.    James, Dougherty [facility]/Anesthesiology Phone [phone] 12/09/2022 2:08 PM

## 2022-12-11 ENCOUNTER — Other Ambulatory Visit: Payer: Self-pay

## 2022-12-11 ENCOUNTER — Inpatient Hospital Stay (HOSPITAL_COMMUNITY): Payer: Medicare Other | Admitting: Physician Assistant

## 2022-12-11 ENCOUNTER — Inpatient Hospital Stay (HOSPITAL_COMMUNITY)
Admission: RE | Admit: 2022-12-11 | Discharge: 2022-12-18 | DRG: 240 | Disposition: A | Discharge status: Skilled Nursing Facility | Payer: Medicare Other | Source: Skilled Nursing Facility | Attending: Internal Medicine | Admitting: Internal Medicine

## 2022-12-11 ENCOUNTER — Encounter (HOSPITAL_COMMUNITY)
Admission: RE | Disposition: A | Discharge status: Skilled Nursing Facility | Payer: Self-pay | Source: Skilled Nursing Facility | Attending: Internal Medicine

## 2022-12-11 ENCOUNTER — Encounter (HOSPITAL_COMMUNITY): Payer: Self-pay | Admitting: Vascular Surgery

## 2022-12-11 DIAGNOSIS — I5042 Chronic combined systolic (congestive) and diastolic (congestive) heart failure: Secondary | ICD-10-CM | POA: Diagnosis not present

## 2022-12-11 DIAGNOSIS — E7800 Pure hypercholesterolemia, unspecified: Secondary | ICD-10-CM | POA: Diagnosis not present

## 2022-12-11 DIAGNOSIS — Z89619 Acquired absence of unspecified leg above knee: Secondary | ICD-10-CM | POA: Diagnosis not present

## 2022-12-11 DIAGNOSIS — Z6831 Body mass index (BMI) 31.0-31.9, adult: Secondary | ICD-10-CM

## 2022-12-11 DIAGNOSIS — E1152 Type 2 diabetes mellitus with diabetic peripheral angiopathy with gangrene: Secondary | ICD-10-CM | POA: Diagnosis not present

## 2022-12-11 DIAGNOSIS — N1831 Chronic kidney disease, stage 3a: Secondary | ICD-10-CM | POA: Diagnosis not present

## 2022-12-11 DIAGNOSIS — M62562 Muscle wasting and atrophy, not elsewhere classified, left lower leg: Secondary | ICD-10-CM | POA: Diagnosis not present

## 2022-12-11 DIAGNOSIS — Z8249 Family history of ischemic heart disease and other diseases of the circulatory system: Secondary | ICD-10-CM | POA: Diagnosis not present

## 2022-12-11 DIAGNOSIS — R140 Abdominal distension (gaseous): Secondary | ICD-10-CM | POA: Diagnosis not present

## 2022-12-11 DIAGNOSIS — Z89612 Acquired absence of left leg above knee: Secondary | ICD-10-CM

## 2022-12-11 DIAGNOSIS — Z89411 Acquired absence of right great toe: Secondary | ICD-10-CM | POA: Diagnosis not present

## 2022-12-11 DIAGNOSIS — E669 Obesity, unspecified: Secondary | ICD-10-CM | POA: Diagnosis present

## 2022-12-11 DIAGNOSIS — Z7985 Long-term (current) use of injectable non-insulin antidiabetic drugs: Secondary | ICD-10-CM

## 2022-12-11 DIAGNOSIS — I739 Peripheral vascular disease, unspecified: Secondary | ICD-10-CM | POA: Diagnosis not present

## 2022-12-11 DIAGNOSIS — I499 Cardiac arrhythmia, unspecified: Secondary | ICD-10-CM | POA: Diagnosis not present

## 2022-12-11 DIAGNOSIS — Z833 Family history of diabetes mellitus: Secondary | ICD-10-CM | POA: Diagnosis not present

## 2022-12-11 DIAGNOSIS — R2689 Other abnormalities of gait and mobility: Secondary | ICD-10-CM | POA: Diagnosis not present

## 2022-12-11 DIAGNOSIS — N1832 Chronic kidney disease, stage 3b: Secondary | ICD-10-CM | POA: Diagnosis not present

## 2022-12-11 DIAGNOSIS — Z8679 Personal history of other diseases of the circulatory system: Secondary | ICD-10-CM | POA: Diagnosis not present

## 2022-12-11 DIAGNOSIS — Z89421 Acquired absence of other right toe(s): Secondary | ICD-10-CM | POA: Diagnosis not present

## 2022-12-11 DIAGNOSIS — Z7984 Long term (current) use of oral hypoglycemic drugs: Secondary | ICD-10-CM

## 2022-12-11 DIAGNOSIS — I5020 Unspecified systolic (congestive) heart failure: Secondary | ICD-10-CM | POA: Diagnosis not present

## 2022-12-11 DIAGNOSIS — M1990 Unspecified osteoarthritis, unspecified site: Secondary | ICD-10-CM | POA: Diagnosis not present

## 2022-12-11 DIAGNOSIS — E1165 Type 2 diabetes mellitus with hyperglycemia: Secondary | ICD-10-CM | POA: Diagnosis not present

## 2022-12-11 DIAGNOSIS — L89152 Pressure ulcer of sacral region, stage 2: Secondary | ICD-10-CM | POA: Diagnosis not present

## 2022-12-11 DIAGNOSIS — I6521 Occlusion and stenosis of right carotid artery: Secondary | ICD-10-CM | POA: Diagnosis present

## 2022-12-11 DIAGNOSIS — Z89412 Acquired absence of left great toe: Secondary | ICD-10-CM | POA: Diagnosis not present

## 2022-12-11 DIAGNOSIS — E118 Type 2 diabetes mellitus with unspecified complications: Principal | ICD-10-CM | POA: Diagnosis present

## 2022-12-11 DIAGNOSIS — S81802A Unspecified open wound, left lower leg, initial encounter: Secondary | ICD-10-CM | POA: Insufficient documentation

## 2022-12-11 DIAGNOSIS — E1151 Type 2 diabetes mellitus with diabetic peripheral angiopathy without gangrene: Secondary | ICD-10-CM

## 2022-12-11 DIAGNOSIS — I70245 Atherosclerosis of native arteries of left leg with ulceration of other part of foot: Secondary | ICD-10-CM | POA: Diagnosis not present

## 2022-12-11 DIAGNOSIS — Z87891 Personal history of nicotine dependence: Secondary | ICD-10-CM

## 2022-12-11 DIAGNOSIS — Z9889 Other specified postprocedural states: Secondary | ICD-10-CM

## 2022-12-11 DIAGNOSIS — Z8673 Personal history of transient ischemic attack (TIA), and cerebral infarction without residual deficits: Secondary | ICD-10-CM | POA: Diagnosis not present

## 2022-12-11 DIAGNOSIS — Z794 Long term (current) use of insulin: Secondary | ICD-10-CM | POA: Diagnosis not present

## 2022-12-11 DIAGNOSIS — Z83438 Family history of other disorder of lipoprotein metabolism and other lipidemia: Secondary | ICD-10-CM

## 2022-12-11 DIAGNOSIS — Z4781 Encounter for orthopedic aftercare following surgical amputation: Secondary | ICD-10-CM | POA: Diagnosis not present

## 2022-12-11 DIAGNOSIS — L089 Local infection of the skin and subcutaneous tissue, unspecified: Secondary | ICD-10-CM | POA: Diagnosis not present

## 2022-12-11 DIAGNOSIS — E1142 Type 2 diabetes mellitus with diabetic polyneuropathy: Secondary | ICD-10-CM | POA: Diagnosis present

## 2022-12-11 DIAGNOSIS — I890 Lymphedema, not elsewhere classified: Secondary | ICD-10-CM | POA: Diagnosis not present

## 2022-12-11 DIAGNOSIS — Z7982 Long term (current) use of aspirin: Secondary | ICD-10-CM

## 2022-12-11 DIAGNOSIS — I130 Hypertensive heart and chronic kidney disease with heart failure and stage 1 through stage 4 chronic kidney disease, or unspecified chronic kidney disease: Secondary | ICD-10-CM | POA: Diagnosis not present

## 2022-12-11 DIAGNOSIS — Z7401 Bed confinement status: Secondary | ICD-10-CM | POA: Diagnosis not present

## 2022-12-11 DIAGNOSIS — I70262 Atherosclerosis of native arteries of extremities with gangrene, left leg: Secondary | ICD-10-CM | POA: Diagnosis not present

## 2022-12-11 DIAGNOSIS — K5900 Constipation, unspecified: Secondary | ICD-10-CM | POA: Diagnosis not present

## 2022-12-11 DIAGNOSIS — I2510 Atherosclerotic heart disease of native coronary artery without angina pectoris: Secondary | ICD-10-CM | POA: Diagnosis not present

## 2022-12-11 DIAGNOSIS — E1122 Type 2 diabetes mellitus with diabetic chronic kidney disease: Secondary | ICD-10-CM | POA: Diagnosis present

## 2022-12-11 DIAGNOSIS — I129 Hypertensive chronic kidney disease with stage 1 through stage 4 chronic kidney disease, or unspecified chronic kidney disease: Secondary | ICD-10-CM | POA: Diagnosis not present

## 2022-12-11 DIAGNOSIS — I70222 Atherosclerosis of native arteries of extremities with rest pain, left leg: Secondary | ICD-10-CM | POA: Diagnosis not present

## 2022-12-11 DIAGNOSIS — G4700 Insomnia, unspecified: Secondary | ICD-10-CM | POA: Diagnosis not present

## 2022-12-11 DIAGNOSIS — Z79899 Other long term (current) drug therapy: Secondary | ICD-10-CM

## 2022-12-11 DIAGNOSIS — N189 Chronic kidney disease, unspecified: Secondary | ICD-10-CM | POA: Diagnosis not present

## 2022-12-11 DIAGNOSIS — I252 Old myocardial infarction: Secondary | ICD-10-CM

## 2022-12-11 DIAGNOSIS — I96 Gangrene, not elsewhere classified: Secondary | ICD-10-CM | POA: Diagnosis present

## 2022-12-11 DIAGNOSIS — L97129 Non-pressure chronic ulcer of left thigh with unspecified severity: Secondary | ICD-10-CM | POA: Diagnosis not present

## 2022-12-11 DIAGNOSIS — E11621 Type 2 diabetes mellitus with foot ulcer: Secondary | ICD-10-CM | POA: Diagnosis not present

## 2022-12-11 DIAGNOSIS — I10 Essential (primary) hypertension: Secondary | ICD-10-CM | POA: Diagnosis not present

## 2022-12-11 DIAGNOSIS — I6529 Occlusion and stenosis of unspecified carotid artery: Secondary | ICD-10-CM | POA: Diagnosis not present

## 2022-12-11 DIAGNOSIS — M62561 Muscle wasting and atrophy, not elsewhere classified, right lower leg: Secondary | ICD-10-CM | POA: Diagnosis not present

## 2022-12-11 DIAGNOSIS — R339 Retention of urine, unspecified: Secondary | ICD-10-CM | POA: Diagnosis present

## 2022-12-11 DIAGNOSIS — E785 Hyperlipidemia, unspecified: Secondary | ICD-10-CM | POA: Diagnosis not present

## 2022-12-11 HISTORY — DX: Myoneural disorder, unspecified: G70.9

## 2022-12-11 HISTORY — DX: Chronic kidney disease, unspecified: N18.9

## 2022-12-11 HISTORY — PX: AMPUTATION: SHX166

## 2022-12-11 LAB — GLUCOSE, CAPILLARY
Glucose-Capillary: 174 mg/dL — ABNORMAL HIGH (ref 70–99)
Glucose-Capillary: 152 mg/dL — ABNORMAL HIGH (ref 70–99)
Glucose-Capillary: 172 mg/dL — ABNORMAL HIGH (ref 70–99)
Glucose-Capillary: 211 mg/dL — ABNORMAL HIGH (ref 70–99)

## 2022-12-11 LAB — CBC
HCT: 47.6 % (ref 39.0–52.0)
HCT: 43.2 % (ref 39.0–52.0)
Hemoglobin: 15 g/dL (ref 13.0–17.0)
Hemoglobin: 14 g/dL (ref 13.0–17.0)
MCH: 30.4 pg (ref 26.0–34.0)
MCH: 31 pg (ref 26.0–34.0)
MCHC: 31.5 g/dL (ref 30.0–36.0)
MCHC: 32.4 g/dL (ref 30.0–36.0)
MCV: 96.6 fL (ref 80.0–100.0)
MCV: 95.8 fL (ref 80.0–100.0)
Platelets: 365 10*3/uL (ref 150–400)
Platelets: 375 10*3/uL (ref 150–400)
RBC: 4.93 MIL/uL (ref 4.22–5.81)
RBC: 4.51 MIL/uL (ref 4.22–5.81)
RDW: 13.2 % (ref 11.5–15.5)
RDW: 13.4 % (ref 11.5–15.5)
WBC: 10.6 10*3/uL — ABNORMAL HIGH (ref 4.0–10.5)
WBC: 11 10*3/uL — ABNORMAL HIGH (ref 4.0–10.5)
nRBC: 0 % (ref 0.0–0.2)
nRBC: 0 % (ref 0.0–0.2)

## 2022-12-11 LAB — COMPREHENSIVE METABOLIC PANEL
ALT: 22 U/L (ref 0–44)
AST: 21 U/L (ref 15–41)
Albumin: 3.1 g/dL — ABNORMAL LOW (ref 3.5–5.0)
Alkaline Phosphatase: 99 U/L (ref 38–126)
Anion gap: 12 (ref 5–15)
BUN: 15 mg/dL (ref 8–23)
CO2: 27 mmol/L (ref 22–32)
Calcium: 9 mg/dL (ref 8.9–10.3)
Chloride: 98 mmol/L (ref 98–111)
Creatinine, Ser: 1.62 mg/dL — ABNORMAL HIGH (ref 0.61–1.24)
GFR, Estimated: 45 mL/min — ABNORMAL LOW (ref >60)
Glucose, Bld: 171 mg/dL — ABNORMAL HIGH (ref 70–99)
Potassium: 4.6 mmol/L (ref 3.5–5.1)
Sodium: 137 mmol/L (ref 135–145)
Total Bilirubin: 1 mg/dL (ref <1.2)
Total Protein: 7.4 g/dL (ref 6.5–8.1)

## 2022-12-11 LAB — TYPE AND SCREEN
ABO/RH(D): O POS
Antibody Screen: NEGATIVE

## 2022-12-11 LAB — SURGICAL PCR SCREEN
MRSA, PCR: POSITIVE — AB
Staphylococcus aureus: POSITIVE — AB

## 2022-12-11 LAB — PROTIME-INR
INR: 1.2 (ref 0.8–1.2)
Prothrombin Time: 15.4 s — ABNORMAL HIGH (ref 11.4–15.2)

## 2022-12-11 LAB — CREATININE, SERUM
Creatinine, Ser: 1.37 mg/dL — ABNORMAL HIGH (ref 0.61–1.24)
GFR, Estimated: 55 mL/min — ABNORMAL LOW (ref >60)

## 2022-12-11 LAB — HEMOGLOBIN A1C
Hgb A1c MFr Bld: 8.4 % — ABNORMAL HIGH (ref 4.8–5.6)
Mean Plasma Glucose: 194.38 mg/dL

## 2022-12-11 LAB — APTT: aPTT: 35 s (ref 24–36)

## 2022-12-11 SURGERY — AMPUTATION, ABOVE KNEE
Anesthesia: General | Site: Knee | Laterality: Left

## 2022-12-11 MED ORDER — INSULIN ASPART 100 UNIT/ML IJ SOLN
0.0000 [IU] | Freq: Three times a day (TID) | INTRAMUSCULAR | Status: DC
  Administered 2022-12-12 – 2022-12-13 (×6): 3 [IU] via SUBCUTANEOUS
  Administered 2022-12-14 – 2022-12-17 (×4): 2 [IU] via SUBCUTANEOUS

## 2022-12-11 MED ORDER — FENTANYL CITRATE (PF) 100 MCG/2ML IJ SOLN
INTRAMUSCULAR | Status: AC
  Filled 2022-12-11: qty 2

## 2022-12-11 MED ORDER — INSULIN ASPART 100 UNIT/ML IJ SOLN
0.0000 [IU] | Freq: Every day | INTRAMUSCULAR | Status: DC
  Administered 2022-12-11: 2 [IU] via SUBCUTANEOUS

## 2022-12-11 MED ORDER — MIDAZOLAM HCL 2 MG/2ML IJ SOLN
INTRAMUSCULAR | Status: AC
Start: 2022-12-11 — End: ?
  Filled 2022-12-11: qty 2

## 2022-12-11 MED ORDER — FENTANYL CITRATE (PF) 100 MCG/2ML IJ SOLN
25.0000 ug | INTRAMUSCULAR | Status: DC | PRN
  Administered 2022-12-11 (×2): 50 ug via INTRAVENOUS

## 2022-12-11 MED ORDER — ETOMIDATE 2 MG/ML IV SOLN
INTRAVENOUS | Status: DC | PRN
  Administered 2022-12-11: 16 mg via INTRAVENOUS

## 2022-12-11 MED ORDER — ATORVASTATIN CALCIUM 10 MG PO TABS
20.0000 mg | ORAL_TABLET | Freq: Every day | ORAL | Status: DC
  Administered 2022-12-11 – 2022-12-17 (×7): 20 mg via ORAL
  Filled 2022-12-11 (×7): qty 2

## 2022-12-11 MED ORDER — ONDANSETRON HCL 4 MG/2ML IJ SOLN
4.0000 mg | Freq: Once | INTRAMUSCULAR | Status: DC | PRN
Start: 2022-12-11 — End: 2022-12-11

## 2022-12-11 MED ORDER — ENOXAPARIN SODIUM 40 MG/0.4ML IJ SOSY
40.0000 mg | PREFILLED_SYRINGE | INTRAMUSCULAR | Status: DC
  Administered 2022-12-12 – 2022-12-17 (×6): 40 mg via SUBCUTANEOUS
  Filled 2022-12-11 (×6): qty 0.4

## 2022-12-11 MED ORDER — FENTANYL CITRATE (PF) 250 MCG/5ML IJ SOLN
INTRAMUSCULAR | Status: AC
  Filled 2022-12-11: qty 5

## 2022-12-11 MED ORDER — ROCURONIUM BROMIDE 10 MG/ML (PF) SYRINGE
PREFILLED_SYRINGE | INTRAVENOUS | Status: AC
Start: 2022-12-11 — End: ?
  Filled 2022-12-11: qty 10

## 2022-12-11 MED ORDER — BISACODYL 5 MG PO TBEC
5.0000 mg | DELAYED_RELEASE_TABLET | Freq: Every day | ORAL | Status: DC | PRN
  Administered 2022-12-14: 5 mg via ORAL
  Filled 2022-12-11: qty 1

## 2022-12-11 MED ORDER — ACETAMINOPHEN 325 MG PO TABS
650.0000 mg | ORAL_TABLET | Freq: Four times a day (QID) | ORAL | Status: DC | PRN
Start: 2022-12-11 — End: 2022-12-17
  Administered 2022-12-12 – 2022-12-13 (×2): 650 mg via ORAL
  Filled 2022-12-11 (×3): qty 2

## 2022-12-11 MED ORDER — FENTANYL CITRATE (PF) 100 MCG/2ML IJ SOLN
INTRAMUSCULAR | Status: AC
  Administered 2022-12-11: 50 ug via INTRAVENOUS
  Filled 2022-12-11: qty 2

## 2022-12-11 MED ORDER — DEXAMETHASONE SODIUM PHOSPHATE 10 MG/ML IJ SOLN
INTRAMUSCULAR | Status: DC | PRN
  Administered 2022-12-11: 5 mg via INTRAVENOUS

## 2022-12-11 MED ORDER — INSULIN GLARGINE-YFGN 100 UNIT/ML ~~LOC~~ SOLN
15.0000 [IU] | Freq: Every day | SUBCUTANEOUS | Status: DC
  Administered 2022-12-11 – 2022-12-16 (×6): 15 [IU] via SUBCUTANEOUS
  Filled 2022-12-11 (×8): qty 0.15

## 2022-12-11 MED ORDER — CHLORHEXIDINE GLUCONATE CLOTH 2 % EX PADS: 6.0000 | MEDICATED_PAD | Freq: Once | CUTANEOUS | Status: DC

## 2022-12-11 MED ORDER — CEFAZOLIN SODIUM-DEXTROSE 2-4 GM/100ML-% IV SOLN
2.0000 g | INTRAVENOUS | Status: AC
Start: 2022-12-11 — End: 2022-12-11
  Administered 2022-12-11: 2 g via INTRAVENOUS
  Filled 2022-12-11: qty 100

## 2022-12-11 MED ORDER — KETAMINE HCL 50 MG/5ML IJ SOSY
PREFILLED_SYRINGE | INTRAMUSCULAR | Status: AC
Start: 2022-12-11 — End: ?
  Filled 2022-12-11: qty 5

## 2022-12-11 MED ORDER — LIDOCAINE 2% (20 MG/ML) 5 ML SYRINGE
INTRAMUSCULAR | Status: DC | PRN
  Administered 2022-12-11: 100 mg via INTRAVENOUS

## 2022-12-11 MED ORDER — SODIUM CHLORIDE 0.9 % IV SOLN
INTRAVENOUS | Status: DC
  Administered 2022-12-12: 100 mL/h via INTRAVENOUS

## 2022-12-11 MED ORDER — SODIUM CHLORIDE 0.9 % IV SOLN
INTRAVENOUS | Status: DC
Start: 2022-12-11 — End: 2022-12-11

## 2022-12-11 MED ORDER — ACETAMINOPHEN 500 MG PO TABS
1000.0000 mg | ORAL_TABLET | Freq: Once | ORAL | Status: AC
  Administered 2022-12-11: 1000 mg via ORAL
  Filled 2022-12-11: qty 2

## 2022-12-11 MED ORDER — DEXAMETHASONE SODIUM PHOSPHATE 10 MG/ML IJ SOLN
INTRAMUSCULAR | Status: AC
  Filled 2022-12-11: qty 1

## 2022-12-11 MED ORDER — 0.9 % SODIUM CHLORIDE (POUR BTL) OPTIME
TOPICAL | Status: DC | PRN
  Administered 2022-12-11: 1000 mL

## 2022-12-11 MED ORDER — ACETAMINOPHEN 650 MG RE SUPP
650.0000 mg | Freq: Four times a day (QID) | RECTAL | Status: DC | PRN
Start: 2022-12-11 — End: 2022-12-17

## 2022-12-11 MED ORDER — ONDANSETRON HCL 4 MG/2ML IJ SOLN
INTRAMUSCULAR | Status: AC
  Filled 2022-12-11: qty 2

## 2022-12-11 MED ORDER — CHLORHEXIDINE GLUCONATE 0.12 % MT SOLN
15.0000 mL | Freq: Once | OROMUCOSAL | Status: AC
  Administered 2022-12-11: 15 mL via OROMUCOSAL
  Filled 2022-12-11: qty 15

## 2022-12-11 MED ORDER — ORAL CARE MOUTH RINSE: 15.0000 mL | Freq: Once | OROMUCOSAL | Status: AC

## 2022-12-11 MED ORDER — SUGAMMADEX SODIUM 200 MG/2ML IV SOLN
INTRAVENOUS | Status: DC | PRN
  Administered 2022-12-11: 200 mg via INTRAVENOUS

## 2022-12-11 MED ORDER — ONDANSETRON HCL 4 MG/2ML IJ SOLN
INTRAMUSCULAR | Status: DC | PRN
  Administered 2022-12-11: 4 mg via INTRAVENOUS

## 2022-12-11 MED ORDER — KETAMINE HCL 10 MG/ML IJ SOLN
INTRAMUSCULAR | Status: DC | PRN
  Administered 2022-12-11: 30 mg via INTRAVENOUS

## 2022-12-11 MED ORDER — FENTANYL CITRATE (PF) 250 MCG/5ML IJ SOLN
INTRAMUSCULAR | Status: DC | PRN
  Administered 2022-12-11: 100 ug via INTRAVENOUS

## 2022-12-11 MED ORDER — METOPROLOL SUCCINATE ER 25 MG PO TB24
50.0000 mg | ORAL_TABLET | Freq: Every day | ORAL | Status: DC
  Administered 2022-12-12 – 2022-12-18 (×7): 50 mg via ORAL
  Filled 2022-12-11 (×2): qty 2
  Filled 2022-12-11 (×2): qty 1
  Filled 2022-12-11: qty 2
  Filled 2022-12-11 (×2): qty 1

## 2022-12-11 MED ORDER — HYDROMORPHONE HCL 1 MG/ML IJ SOLN
INTRAMUSCULAR | Status: AC
  Filled 2022-12-11: qty 1

## 2022-12-11 MED ORDER — HYDROMORPHONE HCL 1 MG/ML IJ SOLN
0.5000 mg | INTRAMUSCULAR | Status: DC | PRN
  Administered 2022-12-11: 1 mg via INTRAVENOUS
  Administered 2022-12-11: 0.5 mg via INTRAVENOUS
  Administered 2022-12-12: 1 mg via INTRAVENOUS
  Administered 2022-12-12: 0.5 mg via INTRAVENOUS
  Administered 2022-12-12 (×4): 1 mg via INTRAVENOUS
  Administered 2022-12-15 (×2): 0.5 mg via INTRAVENOUS
  Administered 2022-12-16: 1 mg via INTRAVENOUS
  Administered 2022-12-16: 0.5 mg via INTRAVENOUS
  Filled 2022-12-11 (×3): qty 1
  Filled 2022-12-11: qty 0.5
  Filled 2022-12-11 (×3): qty 1
  Filled 2022-12-11: qty 0.5
  Filled 2022-12-11 (×3): qty 1

## 2022-12-11 MED ORDER — PROPOFOL 10 MG/ML IV BOLUS
INTRAVENOUS | Status: AC
Start: 2022-12-11 — End: ?
  Filled 2022-12-11: qty 20

## 2022-12-11 MED ORDER — ROCURONIUM BROMIDE 10 MG/ML (PF) SYRINGE
PREFILLED_SYRINGE | INTRAVENOUS | Status: DC | PRN
  Administered 2022-12-11: 50 mg via INTRAVENOUS

## 2022-12-11 MED ORDER — DULOXETINE HCL 30 MG PO CPEP
30.0000 mg | ORAL_CAPSULE | Freq: Every day | ORAL | Status: DC
Start: 2022-12-12 — End: 2022-12-18
  Administered 2022-12-12 – 2022-12-18 (×7): 30 mg via ORAL
  Filled 2022-12-11 (×7): qty 1

## 2022-12-11 MED ORDER — LACTATED RINGERS IV SOLN: INTRAVENOUS | Status: DC

## 2022-12-11 MED ORDER — GABAPENTIN 300 MG PO CAPS
600.0000 mg | ORAL_CAPSULE | Freq: Three times a day (TID) | ORAL | Status: DC
  Administered 2022-12-11 – 2022-12-18 (×20): 600 mg via ORAL
  Filled 2022-12-11 (×20): qty 2

## 2022-12-11 SURGICAL SUPPLY — 67 items
APPLIER CLIP 9.375 MED OPEN (MISCELLANEOUS) ×1
APPLIER CLIP 9.375 SM OPEN (CLIP) ×1
BAG COUNTER SPONGE SURGICOUNT (BAG) ×1 IMPLANT
BANDAGE ESMARK 6X9 LF (GAUZE/BANDAGES/DRESSINGS) ×1 IMPLANT
BLADE SAW SGTL 73X25 THK (BLADE) ×1 IMPLANT
BNDG COHESIVE 6X5 TAN ST LF (GAUZE/BANDAGES/DRESSINGS) ×1 IMPLANT
BNDG ELASTIC 4INX 5YD STR LF (GAUZE/BANDAGES/DRESSINGS) IMPLANT
BNDG ELASTIC 4X5.8 VLCR STR LF (GAUZE/BANDAGES/DRESSINGS) ×1 IMPLANT
BNDG ELASTIC 6INX 5YD STR LF (GAUZE/BANDAGES/DRESSINGS) IMPLANT
BNDG ELASTIC 6X5.8 VLCR STR LF (GAUZE/BANDAGES/DRESSINGS) ×1 IMPLANT
BNDG ESMARK 6X9 LF (GAUZE/BANDAGES/DRESSINGS) ×1
BNDG GAUZE DERMACEA FLUFF 4 (GAUZE/BANDAGES/DRESSINGS) ×2 IMPLANT
BUR DISC 0.8X25 (BURR) IMPLANT
CANISTER SUCT 3000ML PPV (MISCELLANEOUS) ×1 IMPLANT
CLIP APPLIE 9.375 MED OPEN (MISCELLANEOUS) ×1 IMPLANT
CLIP APPLIE 9.375 SM OPEN (CLIP) ×1 IMPLANT
CLIP TI LARGE 6 (CLIP) IMPLANT
CLIP TI MEDIUM 24 (CLIP) ×1 IMPLANT
CLIP TI MEDIUM 6 (CLIP) ×1 IMPLANT
CLIP TI WIDE RED SMALL 24 (CLIP) ×1 IMPLANT
COVER BACK TABLE 60X90IN (DRAPES) ×1 IMPLANT
COVER SURGICAL LIGHT HANDLE (MISCELLANEOUS) ×2 IMPLANT
DRAIN CHANNEL 19F RND (DRAIN) IMPLANT
DRAPE HALF SHEET 40X57 (DRAPES) ×1 IMPLANT
DRAPE INCISE 23X17 STRL (DRAPES) ×1 IMPLANT
DRAPE INCISE IOBAN 23X17 STRL (DRAPES) ×1 IMPLANT
DRAPE INCISE IOBAN 66X45 STRL (DRAPES) ×1 IMPLANT
DRAPE SURG ORHT 6 SPLT 77X108 (DRAPES) ×2 IMPLANT
DRAPE U-SHAPE 47X51 STRL (DRAPES) IMPLANT
ELECT CAUTERY BLADE 6.4 (BLADE) ×1 IMPLANT
ELECT REM PT RETURN 9FT ADLT (ELECTROSURGICAL) ×1
ELECTRODE REM PT RTRN 9FT ADLT (ELECTROSURGICAL) ×1 IMPLANT
EVACUATOR SILICONE 100CC (DRAIN) IMPLANT
GAUZE PAD ABD 8X10 STRL (GAUZE/BANDAGES/DRESSINGS) IMPLANT
GAUZE SPONGE 4X4 12PLY STRL (GAUZE/BANDAGES/DRESSINGS) ×2 IMPLANT
GAUZE XEROFORM 5X9 LF (GAUZE/BANDAGES/DRESSINGS) IMPLANT
GLOVE BIOGEL PI IND STRL 6 (GLOVE) IMPLANT
GLOVE BIOGEL PI IND STRL 6.5 (GLOVE) IMPLANT
GLOVE BIOGEL PI IND STRL 7.5 (GLOVE) IMPLANT
GLOVE BIOGEL PI IND STRL 8 (GLOVE) ×1 IMPLANT
GLOVE SS BIOGEL STRL SZ 7 (GLOVE) IMPLANT
GOWN STRL REUS W/TWL 2XL LVL3 (GOWN DISPOSABLE) ×2 IMPLANT
KIT BASIN OR (CUSTOM PROCEDURE TRAY) ×1 IMPLANT
KIT TURNOVER KIT B (KITS) ×1 IMPLANT
NS IRRIG 1000ML POUR BTL (IV SOLUTION) ×1 IMPLANT
PACK GENERAL/GYN (CUSTOM PROCEDURE TRAY) ×1 IMPLANT
PAD ARMBOARD 7.5X6 YLW CONV (MISCELLANEOUS) ×2 IMPLANT
RASP HELIOCORDIAL MED (MISCELLANEOUS) IMPLANT
STAPLER VISISTAT 35W (STAPLE) ×1 IMPLANT
STOCKINETTE IMPERVIOUS LG (DRAPES) ×1 IMPLANT
SUT ETHILON 3 0 PS 1 (SUTURE) IMPLANT
SUT SILK 0 TIES 10X30 (SUTURE) ×1 IMPLANT
SUT SILK 2 0 (SUTURE) ×1
SUT SILK 2 0 SH CR/8 (SUTURE) ×1 IMPLANT
SUT SILK 2 0 TIES 10X30 (SUTURE) IMPLANT
SUT SILK 2-0 18XBRD TIE 12 (SUTURE) ×1 IMPLANT
SUT SILK 3 0 TIES 10X30 (SUTURE) IMPLANT
SUT VIC AB 0 CT1 27 (SUTURE) ×1
SUT VIC AB 0 CT1 27XBRD ANBCTR (SUTURE) ×1 IMPLANT
SUT VIC AB 2-0 CT1 18 (SUTURE) ×2 IMPLANT
SUT VIC AB 3-0 SH 18 (SUTURE) IMPLANT
SUT VIC AB 3-0 SH 8-18 (SUTURE) IMPLANT
SUT VIC AB CT1 27XBRD ANBCTRL (SUTURE) ×1
SUT VICRYL 0 TIES 12 18 (SUTURE) IMPLANT
TOWEL GREEN STERILE (TOWEL DISPOSABLE) ×2 IMPLANT
UNDERPAD 30X36 HEAVY ABSORB (UNDERPADS AND DIAPERS) ×1 IMPLANT
WATER STERILE IRR 1000ML POUR (IV SOLUTION) ×1 IMPLANT

## 2022-12-11 NOTE — Op Note (Signed)
    NAME: [name]    MRN: [id number] DOB: [birth year]    DATE OF OPERATION: 12/11/2022  PREOP DIAGNOSIS:    Left lower extremity critical limb ischemia with unreconstructable vascular disease, and significant tissue loss  POSTOP DIAGNOSIS:    Same  PROCEDURE:    Left above-knee amputation  SURGEON: Victorino Sparrow  ASSIST: Kayren Eaves, PA  ANESTHESIA: General  EBL: 150 mL  INDICATIONS:    James Dougherty is a 72 y.o. male with history of multiple interventions to bilateral lower extremities and nonhealing left first toe ray amputation.  He has no revascularization options at this time.  After discussing risks and benefits of left-sided above-knee amputation as this provides the greatest opportunity of healing, [name] elected to proceed.  FINDINGS:   Healthy thigh muscle  TECHNIQUE:   Patient was brought to the OR laid in the supine position.  General anesthesia was induced and the patient was prepped and draped in standard fashion.  A timeout was performed.  A fish-mouth incision line was drawn 7cm from the patella for the left above-knee amputation.  Sharp dissection and cautery were used.  Healthy bleeding was encountered. Thigh muscles were divided and femoral artery and vein identified.  These were oversewn using two, 2-0 silk pop stick ties. Once the circumferential exposure of the femur was complete, a reciprocating saw was used to divide the femur. This was followed by amputation knife through the back of the thigh.     The sciatic nerve was identified, pulled to length and ligated with a zero silk tie. Once hemostasis was achieved, warm saline was brought to the field and the wound bed was irrigated.  I used a 0 Vicryl to close the tissue around the femur.  There was no bleeding from the marrow.  Next, my attention turned to closure of the fascial layer.  This was done using 2-0 Vicryl pop suture.  The fascial layer was closed along the entirety of the incision.   A stapler was brought to the field to oppose the dermis.  An occlusive dressing was placed and the patient was taken the PACU in stable condition.  Given the complexity of the case a first assistant was necessary in order to expedient the procedure and safely perform the technical aspects of the operation.   Victorino Sparrow, MD [facility] DATE OF DICTATION:   12/11/2022

## 2022-12-11 NOTE — Anesthesia Procedure Notes (Signed)
Procedure Name: Intubation Date/Time: 12/11/2022 1:40 PM  Performed by: Thomasene Ripple, CRNAPre-anesthesia Checklist: Patient identified, Emergency Drugs available, Suction available and Patient being monitored Patient Re-evaluated:Patient Re-evaluated prior to induction Oxygen Delivery Method: Circle System Utilized Preoxygenation: Pre-oxygenation with 100% oxygen Induction Type: IV induction Ventilation: Mask ventilation without difficulty and Oral airway inserted - appropriate to patient size Laryngoscope Size: Mac and 3 Grade View: Grade I Tube type: Oral Tube size: 8.0 mm Number of attempts: 1 Airway Equipment and Method: Stylet and Oral airway Placement Confirmation: ETT inserted through vocal cords under direct vision, positive ETCO2 and breath sounds checked- equal and bilateral Secured at: 23 cm Tube secured with: Tape Dental Injury: Teeth and Oropharynx as per pre-operative assessment

## 2022-12-11 NOTE — Progress Notes (Signed)
Pt received from OR, L AKA unremarkable. CHG performed, tele applied and CCMD notified.  VSS and charted.  Pt assisted to set up for meal, appreciative and appropriate.  Will report to night RN

## 2022-12-11 NOTE — H&P (Signed)
History and Physical    James Dougherty:[id number] DOB: September 17, 1950 DOA: 12/11/2022  PCP: [name], MD  Patient coming from: PACU  I have personally briefly reviewed patient's old medical records in [facility]  Chief Complaint: Left AKA  HPI: James Dougherty is a 72 y.o. male with medical history significant of chronic BLE wounds with lymphedema, T2DM with neuropathy, PAD, hyperlipidemia who was brought into the hospital today for elective AKA due to nonhealing left lower extremity wounds.  Hospitalist service was requested for the admission and vascular surgery will follow along.  Reportedly, He recently underwent DCB SFA, lithotripsy popliteal artery, PTX tibioperoneal trunk, DCB right external iliac artery by Dr. Karin Lieu on 10/20/2022, unfortunately he developed necrotic wounds on the foot and great toe amputation site as well as lateral foot position.  He is a Nurse, adult patient residing at a facility.  He walks with the help of therapy team using walker.  Patient did not have any fever, chills, sweating or any other complaint.  He was urgently added on for the surgery for left AKA today.  Please note that he also has history of critical right ICA stenosis.  Although he is asymptomatic from that standpoint.  He just underwent AKA.  Patient seen and examined in the PACU.  By the time I saw him, patient was still recovering from his anesthesia and was unable to have conversation and appeared comfortable.  ED Course: No ED course  Review of Systems: Unable to review of system since patient is recovering from anesthesia.   Past Medical History:  Diagnosis Date   Arthritis    "hands" (12/27/2012)   Chronic kidney disease    stage 3, in [place]   Depression 10/26/2022   in [place]   High cholesterol    Hypertension    Neuromuscular disorder (HCC)    feet neuropathy   Neuropathic pain    PAD (peripheral artery disease) (HCC)    Psoriasis    Stroke (HCC) 1999   "still have some  speech problems at times; sometimes forget what I was going to say" (12/27/2012)   Type II diabetes mellitus (HCC)    Ulcer    Left ankle/leg   Walker as ambulation aid 12/02/2021   or wheelchair    Past Surgical History:  Procedure Laterality Date   ABDOMINAL AORTAGRAM N/A 03/30/2012   Procedure: ABDOMINAL Ronny Flurry;  Surgeon: Nada Libman, MD;  Location: [facility] CATH LAB;  Service: Cardiovascular;  Laterality: N/A;   ABDOMINAL AORTAGRAM N/A 12/27/2012   Procedure: ABDOMINAL Ronny Flurry;  Surgeon: Nada Libman, MD;  Location: [facility] CATH LAB;  Service: Cardiovascular;  Laterality: N/A;   ABDOMINAL AORTOGRAM W/LOWER EXTREMITY N/A 04/05/2017   Procedure: ABDOMINAL AORTOGRAM W/LOWER EXTREMITY;  Surgeon: Sherren Kerns, MD;  Location: [facility] INVASIVE CV LAB;  Service: Cardiovascular;  Laterality: N/A;   ABDOMINAL AORTOGRAM W/LOWER EXTREMITY N/A 09/30/2022   Procedure: ABDOMINAL AORTOGRAM W/LOWER EXTREMITY;  Surgeon: Victorino Sparrow, MD;  Location: [facility] INVASIVE CV LAB;  Service: Cardiovascular;  Laterality: N/A;   AMPUTATION Right 04/26/2017   Procedure: AMPUTATION TRANSMETATARSAL RIGHT GREAT TOE;  Surgeon: Larina Earthly, MD;  Location: [facility] OR;  Service: Vascular;  Laterality: Right;   ANGIOPLASTY / STENTING FEMORAL Left 12/27/2012   APPLICATION OF WOUND VAC Right 04/26/2017   Procedure: APPLICATION OF WOUND VAC;  Surgeon: Larina Earthly, MD;  Location: [facility] OR;  Service: Vascular;  Laterality: Right;   FEMORAL ARTERY STENT  03/30/2012  LOWER EXTREMITY ANGIOGRAM  12/27/2012   Procedure: LOWER EXTREMITY ANGIOGRAM;  Surgeon: Nada Libman, MD;  Location: [facility] CATH LAB;  Service: Cardiovascular;;   LOWER EXTREMITY ANGIOGRAPHY N/A 04/19/2018   Procedure: LOWER EXTREMITY ANGIOGRAPHY;  Surgeon: Nada Libman, MD;  Location: [facility] INVASIVE CV LAB;  Service: Cardiovascular;  Laterality: N/A;   PERIPHERAL VASCULAR ATHERECTOMY Right 04/05/2017   Procedure: PERIPHERAL VASCULAR ATHERECTOMY;  Surgeon: Sherren Kerns, MD;   Location: [facility] INVASIVE CV LAB;  Service: Cardiovascular;  Laterality: Right;  superficial femoral   PERIPHERAL VASCULAR BALLOON ANGIOPLASTY  04/19/2018   Procedure: PERIPHERAL VASCULAR BALLOON ANGIOPLASTY;  Surgeon: Nada Libman, MD;  Location: [facility] INVASIVE CV LAB;  Service: Cardiovascular;;   PERIPHERAL VASCULAR BALLOON ANGIOPLASTY Left 09/30/2022   Procedure: PERIPHERAL VASCULAR BALLOON ANGIOPLASTY;  Surgeon: Victorino Sparrow, MD;  Location: [facility] INVASIVE CV LAB;  Service: Cardiovascular;  Laterality: Left;  SFA, Pop, TP trunk & Rt Ext Iliac   PERIPHERAL VASCULAR INTERVENTION Right 04/05/2017   Procedure: PERIPHERAL VASCULAR INTERVENTION;  Surgeon: Sherren Kerns, MD;  Location: [facility] INVASIVE CV LAB;  Service: Cardiovascular;  Laterality: Right;   Superficial femorl and external iliac   POPLITEAL ARTERY STENT     TONSILLECTOMY AND ADENOIDECTOMY       reports that he quit smoking about 11 years ago. His smoking use included cigarettes. He started smoking about 56 years ago. He has a 135 pack-year smoking history. He has never been exposed to tobacco smoke. He has never used smokeless tobacco. He reports that he does not currently use alcohol. He reports that he does not use drugs.  Allergies  Allergen Reactions   Oxycodone Nausea And Vomiting   Glipizide     Bad headache and blurred vision   Other     Soft silicone dressing caused wound to get worse   Bactrim [Sulfamethoxazole-Trimethoprim] Itching and Rash    Family History  Problem Relation Age of Onset   Diabetes Sister        Bilateral amputation of lower legs   Diabetes Sister    Heart disease Sister 77       Heart disease before age 31   Hypertension Sister    Heart attack Sister    Hyperlipidemia Sister    Hypertension Father    Hyperlipidemia Father    Hyperlipidemia Mother    Hypertension Mother    Hypertension Daughter     Prior to Admission medications   Medication Sig Start Date End Date Taking? Authorizing Provider   aspirin EC 81 MG tablet Take 81 mg by mouth daily. Swallow whole.   Yes [provider]  atorvastatin (LIPITOR) 20 MG tablet Take 1 tablet (20 mg total) by mouth at bedtime. Too soon to refill 11/12/21  Yes Love, Evlyn Kanner, PA-C  Dulaglutide (TRULICITY) 1.5 MG/0.5ML SOAJ Inject 1.5 mg into the skin once a week. Takes weekly on Saturday   Yes [provider]  DULoxetine (CYMBALTA) 30 MG capsule Take 30 mg by mouth daily.   Yes [provider]  empagliflozin (JARDIANCE) 10 MG TABS tablet Take 1 tablet (10 mg total) by mouth daily. 02/26/22  Yes Branch, Dorothe Pea, MD  furosemide (LASIX) 20 MG tablet Take 1 tablet (20 mg total) by mouth daily. Patient taking differently: Take 20 mg by mouth in the morning and at bedtime. 11/12/21  Yes Love, Evlyn Kanner, PA-C  gabapentin (NEURONTIN) 300 MG capsule Take 600 mg by mouth 3 (three) times daily.   Yes [provider]  insulin detemir (LEVEMIR) 100 UNIT/ML injection Inject 21 Units into the skin at bedtime.   Yes [provider]  metoprolol succinate (TOPROL-XL) 50 MG 24 hr tablet Take 50 mg by mouth daily. Take with or immediately following a meal.   Yes [provider]  Protein POWD Take 6 g by mouth in the morning, at noon, and at bedtime.   Yes [provider]  traMADol (ULTRAM) 50 MG tablet Take 50 mg by mouth every 6 (six) hours as needed (pain.).   Yes [provider]  bisacodyl (DULCOLAX) 5 MG EC tablet Take 5 mg by mouth daily as needed for moderate constipation.    [provider]  clopidogrel (PLAVIX) 75 MG tablet Take 1 tablet (75 mg total) by mouth daily. Patient not taking: Reported on 12/09/2022 09/30/22 09/30/23  Victorino Sparrow, MD  Melatonin 3 MG CAPS Take 3 mg by mouth at bedtime as needed (sleep).    [provider]  Menthol-Zinc Oxide (CALMOSEPTINE EX) Apply 1 Application topically in the morning and at bedtime.    [provider]    Physical  Exam: Vitals:   12/09/22 1006 12/11/22 1014  BP:  (!) 147/79  Pulse:  62  Resp:  17  Temp:  97.8 F (36.6 C)  TempSrc:  Oral  SpO2:  97%  Weight: 92.5 kg 92 kg  Height: 5\' 8"  (1.727 m) 5\' 8"  (1.727 m)    Constitutional: NAD, calm, comfortable Vitals:   12/09/22 1006 12/11/22 1014  BP:  (!) 147/79  Pulse:  62  Resp:  17  Temp:  97.8 F (36.6 C)  TempSrc:  Oral  SpO2:  97%  Weight: 92.5 kg 92 kg  Height: 5\' 8"  (1.727 m) 5\' 8"  (1.727 m)   Eyes: PERRL, lids and conjunctivae normal ENMT: Mucous membranes are dry. Posterior pharynx clear of any exudate or lesions.Normal dentition.  Neck: normal, supple, no masses, no thyromegaly Respiratory: clear to auscultation bilaterally, no wheezing, no crackles. Normal respiratory effort. No accessory muscle use.  Cardiovascular: Regular rate and rhythm, no murmurs / rubs / gallops. No extremity edema. 2+ pedal pulses. No carotid bruits.  Abdomen: no tenderness, no masses palpated. No hepatosplenomegaly. Bowel sounds positive.  Musculoskeletal: Left AKA  Labs on Admission: I have personally reviewed following labs and imaging studies  CBC: Recent Labs  Lab 12/11/22 1105  WBC 10.6*  HGB 15.0  HCT 47.6  MCV 96.6  PLT 365   Basic Metabolic Panel: Recent Labs  Lab 12/11/22 1105  NA 137  K 4.6  CL 98  CO2 27  GLUCOSE 171*  BUN 15  CREATININE 1.62*  CALCIUM 9.0   GFR: Estimated Creatinine Clearance: 45.4 mL/min (A) (by C-G formula based on SCr of 1.62 mg/dL (H)). Liver Function Tests: Recent Labs  Lab 12/11/22 1105  AST 21  ALT 22  ALKPHOS 99  BILITOT 1.0  PROT 7.4  ALBUMIN 3.1*   No results for input(s): "LIPASE", "AMYLASE" in the last 168 hours. No results for input(s): "AMMONIA" in the last 168 hours. Coagulation Profile: Recent Labs  Lab 12/11/22 1105  INR 1.2   Cardiac Enzymes: No results for input(s): "CKTOTAL", "CKMB", "CKMBINDEX", "TROPONINI" in the last 168 hours. BNP (last 3 results) No results  for input(s): "PROBNP" in the last 8760 hours. HbA1C: No results for input(s): "HGBA1C" in the last 72 hours. CBG: Recent Labs  Lab 12/11/22 1019 12/11/22 1224  GLUCAP 174* 152*   Lipid Profile:  No results for input(s): "CHOL", "HDL", "LDLCALC", "TRIG", "CHOLHDL", "LDLDIRECT" in the last 72 hours. Thyroid Function Tests: No results for input(s): "TSH", "T4TOTAL", "FREET4", "T3FREE", "THYROIDAB" in the last 72 hours. Anemia Panel: No results for input(s): "VITAMINB12", "FOLATE", "FERRITIN", "TIBC", "IRON", "RETICCTPCT" in the last 72 hours. Urine analysis:    Component Value Date/Time   COLORURINE YELLOW 10/28/2021 2206   APPEARANCEUR CLEAR 10/28/2021 2206   LABSPEC 1.015 10/28/2021 2206   PHURINE 7.0 10/28/2021 2206   GLUCOSEU 50 (A) 10/28/2021 2206   HGBUR NEGATIVE 10/28/2021 2206   BILIRUBINUR NEGATIVE 10/28/2021 2206   KETONESUR 20 (A) 10/28/2021 2206   PROTEINUR NEGATIVE 10/28/2021 2206   NITRITE NEGATIVE 10/28/2021 2206   LEUKOCYTESUR NEGATIVE 10/28/2021 2206    Radiological Exams on Admission: No results found.   Assessment/Plan Active Problems:   PVD (peripheral vascular disease) (HCC)   PAD (peripheral artery disease) (HCC)   Type II diabetes mellitus with complication (HCC)   Dry gangrene (HCC)   Multiple open wounds of left lower extremity   S/P AKA (above knee amputation) unilateral, left (HCC)   History of PAD with diet and clean and nonhealing multiple wounds of the left lower extremity status post AKA: Management per vascular surgery.  Holding aspirin and Plavix for now.  Will likely resume tomorrow.  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  Patient takes Lasix at home.  Currently appears dry, unsure how much he is going to eat or drink, will hold his Lasix as well as Jardiance for now.  This will need to be readdressed in the morning as  well.  Right ICA stenosis: Known to vascular surgery.  Management per them.  Hyperlipidemia: Resume atorvastatin.  Type 2 diabetes mellitus with peripheral neuropathy: On 21 units of Levemir at bedtime and Trulicity and Jardiance.  Will start him on 15 units of Semglee at night along with SSI for now.  Resume gabapentin  Essential hypertension: Will resume Toprol XL  DVT prophylaxis: None for now.  Avoiding Lovenox/heparin products for at least until 24 hours after the surgery.  Order placed to start at 3 PM 12/12/2022. Code Status: Full code by default.  Patient unable to have any conversation.  This will need to be readdressed once patient is more alert and oriented. Family Communication: None present at bedside.   Disposition Plan: Discharge when cleared by vascular surgery. Consults called: Vascular surgery  Hughie Closs MD [facility]  *Please note that this is a verbal dictation therefore any spelling or grammatical errors are due to the "[employer] One" system interpretation.  Please page via Amion and do not message via secure chat for urgent patient care matters. Secure chat can be used for non urgent patient care matters. 12/11/2022, 3:16 PM  To contact the attending provider between 7A-7P or the covering provider during after hours 7P-7A, please log into the web site [email or web address]

## 2022-12-11 NOTE — H&P (Signed)
Office Note   Patient seen and examined in preop holding.  No complaints. No changes to medication history or physical exam since last seen in clinic. After discussing the risks and benefits of left AKA for nonsalvagable tissue loss, James Dougherty elected to proceed.   Victorino Sparrow MD   CC:  follow up Requesting Provider:  No ref. provider found  HPI: James Dougherty is a 72 y.o. ([birth year]) male who presents as an urgent add-on to clinic due to worsening tissue loss of the left foot.  He recently underwent  DCB SFA, lithotripsy popliteal artery, PTX tibioperoneal trunk, DCB right external iliac artery by Dr. Karin Lieu on 10/20/2022.  Unfortunately he has developed necrotic wounds on the foot and the great toe amputation site as well as the lateral foot position.  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  Patient states he walks with the help of the therapy team with a walker however admittedly is not very many steps prior to having to stop and sit.  He denies fevers, chills, nausea/vomiting.  It should also be noted he has a known critical right ICA stenosis.  This remains asymptomatic.  Given his multiple comorbidities and current clinical state, the patient and his family as well as Dr. Karin Lieu have agreed to manage this conservatively.   Past Medical History:  Diagnosis Date   Arthritis    "hands" (12/27/2012)   Chronic kidney disease    stage 3, in [place]   Depression 10/26/2022   in [place]   High cholesterol    Hypertension    Neuromuscular disorder (HCC)    feet neuropathy   Neuropathic pain    PAD (peripheral artery disease) (HCC)    Psoriasis    Stroke (HCC) 1999   "still have some speech problems at times; sometimes forget what I was going to say" (12/27/2012)   Type II diabetes mellitus (HCC)    Ulcer    Left ankle/leg   Walker as ambulation aid 12/02/2021   or wheelchair    Past Surgical History:  Procedure Laterality Date    ABDOMINAL AORTAGRAM N/A 03/30/2012   Procedure: ABDOMINAL Ronny Flurry;  Surgeon: Nada Libman, MD;  Location: [facility] CATH LAB;  Service: Cardiovascular;  Laterality: N/A;   ABDOMINAL AORTAGRAM N/A 12/27/2012   Procedure: ABDOMINAL Ronny Flurry;  Surgeon: Nada Libman, MD;  Location: [facility] CATH LAB;  Service: Cardiovascular;  Laterality: N/A;   ABDOMINAL AORTOGRAM W/LOWER EXTREMITY N/A 04/05/2017   Procedure: ABDOMINAL AORTOGRAM W/LOWER EXTREMITY;  Surgeon: Sherren Kerns, MD;  Location: [facility] INVASIVE CV LAB;  Service: Cardiovascular;  Laterality: N/A;   ABDOMINAL AORTOGRAM W/LOWER EXTREMITY N/A 09/30/2022   Procedure: ABDOMINAL AORTOGRAM W/LOWER EXTREMITY;  Surgeon: Victorino Sparrow, MD;  Location: [facility] INVASIVE CV LAB;  Service: Cardiovascular;  Laterality: N/A;   AMPUTATION Right 04/26/2017   Procedure: AMPUTATION TRANSMETATARSAL RIGHT GREAT TOE;  Surgeon: Larina Earthly, MD;  Location: [facility] OR;  Service: Vascular;  Laterality: Right;   ANGIOPLASTY / STENTING FEMORAL Left 12/27/2012   APPLICATION OF WOUND VAC Right 04/26/2017   Procedure: APPLICATION OF WOUND VAC;  Surgeon: Larina Earthly, MD;  Location: [facility] OR;  Service: Vascular;  Laterality: Right;   FEMORAL ARTERY STENT  03/30/2012   LOWER EXTREMITY ANGIOGRAM  12/27/2012   Procedure: LOWER EXTREMITY ANGIOGRAM;  Surgeon: Nada Libman, MD;  Location: [facility] CATH LAB;  Service: Cardiovascular;;   LOWER EXTREMITY ANGIOGRAPHY N/A 04/19/2018   Procedure:  LOWER EXTREMITY ANGIOGRAPHY;  Surgeon: Nada Libman, MD;  Location: [facility] INVASIVE CV LAB;  Service: Cardiovascular;  Laterality: N/A;   PERIPHERAL VASCULAR ATHERECTOMY Right 04/05/2017   Procedure: PERIPHERAL VASCULAR ATHERECTOMY;  Surgeon: Sherren Kerns, MD;  Location: [facility] INVASIVE CV LAB;  Service: Cardiovascular;  Laterality: Right;  superficial femoral   PERIPHERAL VASCULAR BALLOON ANGIOPLASTY  04/19/2018   Procedure: PERIPHERAL VASCULAR BALLOON ANGIOPLASTY;  Surgeon: Nada Libman, MD;  Location: [facility] INVASIVE  CV LAB;  Service: Cardiovascular;;   PERIPHERAL VASCULAR BALLOON ANGIOPLASTY Left 09/30/2022   Procedure: PERIPHERAL VASCULAR BALLOON ANGIOPLASTY;  Surgeon: Victorino Sparrow, MD;  Location: [facility] INVASIVE CV LAB;  Service: Cardiovascular;  Laterality: Left;  SFA, Pop, TP trunk & Rt Ext Iliac   PERIPHERAL VASCULAR INTERVENTION Right 04/05/2017   Procedure: PERIPHERAL VASCULAR INTERVENTION;  Surgeon: Sherren Kerns, MD;  Location: [facility] INVASIVE CV LAB;  Service: Cardiovascular;  Laterality: Right;   Superficial femorl and external iliac   POPLITEAL ARTERY STENT     TONSILLECTOMY AND ADENOIDECTOMY      Social History   Socioeconomic History   Marital status: Single    Spouse name: Not on file   Number of children: Not on file   Years of education: Not on file   Highest education level: Not on file  Occupational History   Not on file  Tobacco Use   Smoking status: Former    Current packs/day: 0.00    Average packs/day: 3.0 packs/day for 45.0 years (135.0 ttl pk-yrs)    Types: Cigarettes    Start date: 01/06/1966    Quit date: 01/07/2011    Years since quitting: 11.9    Passive exposure: Never   Smokeless tobacco: Never  Vaping Use   Vaping status: Never Used  Substance and Sexual Activity   Alcohol use: Not Currently    Comment: 12/27/2012 "couple times/yr I might have a drink"   Drug use: No   Sexual activity: Not Currently  Other Topics Concern   Not on file  Social History Narrative   Not on file   Social Determinants of Health   Financial Resource Strain: Patient Declined (10/20/2022)   Received from [facility]   Overall Financial Resource Strain (CARDIA)    Difficulty of Paying Living Expenses: Patient declined  Food Insecurity: Patient Declined (10/20/2022)   Received from [facility]   Hunger Vital Sign    Worried About Running Out of Food in the Last Year: Patient declined    Ran Out of Food in the Last Year: Patient declined  Transportation Needs: Patient  Declined (10/20/2022)   Received from [facility]   PRAPARE - Transportation    Lack of Transportation (Medical): Patient declined    Lack of Transportation (Non-Medical): Patient declined  Physical Activity: Inactive (10/20/2022)   Received from [facility]   Exercise Vital Sign    Days of Exercise per Week: 0 days    Minutes of Exercise per Session: 0 min  Stress: No Stress Concern Present (10/20/2022)   Received from [facility] - Occupational Stress Questionnaire    Feeling of Stress : Not at all  Social Connections: Patient Declined (10/20/2022)   Received from [facility]   Social Connection and Isolation Panel [NHANES]    Frequency of Communication with Friends and Family: Patient declined    Frequency of Social Gatherings with Friends and Family: Patient declined    Attends  Religious Services: Patient declined    Active Member of Clubs or Organizations: Patient declined    Attends Banker Meetings: Patient declined    Marital Status: Patient declined  Intimate Partner Violence: Patient Declined (10/20/2022)   Received from [facility]   Humiliation, Afraid, Rape, and Kick questionnaire    Fear of Current or Ex-Partner: Patient declined    Emotionally Abused: Patient declined    Physically Abused: Patient declined    Sexually Abused: Patient declined    Family History  Problem Relation Age of Onset   Diabetes Sister        Bilateral amputation of lower legs   Diabetes Sister    Heart disease Sister 63       Heart disease before age 48   Hypertension Sister    Heart attack Sister    Hyperlipidemia Sister    Hypertension Father    Hyperlipidemia Father    Hyperlipidemia Mother    Hypertension Mother    Hypertension Daughter     Current Facility-Administered Medications  Medication Dose Route Frequency Provider Last Rate Last Admin   0.9 %  sodium chloride infusion   Intravenous Continuous  Victorino Sparrow, MD       ceFAZolin (ANCEF) IVPB 2g/100 mL premix  2 g Intravenous 30 min Pre-Op Victorino Sparrow, MD       Chlorhexidine Gluconate Cloth 2 % PADS 6 each  6 each Topical Once Victorino Sparrow, MD       And   Chlorhexidine Gluconate Cloth 2 % PADS 6 each  6 each Topical Once Victorino Sparrow, MD       lactated ringers infusion   Intravenous Continuous Collene Schlichter, MD 10 mL/hr at 12/11/22 1122 New Bag at 12/11/22 1122    Allergies  Allergen Reactions   Oxycodone Nausea And Vomiting   Glipizide     Bad headache and blurred vision   Other     Soft silicone dressing caused wound to get worse   Bactrim [Sulfamethoxazole-Trimethoprim] Itching and Rash     REVIEW OF SYSTEMS:   [X]  denotes positive finding, [ ]  denotes negative finding Cardiac  Comments:  Chest pain or chest pressure:    Shortness of breath upon exertion:    Short of breath when lying flat:    Irregular heart rhythm:        Vascular    Pain in calf, thigh, or hip brought on by ambulation:    Pain in feet at night that wakes you up from your sleep:     Blood clot in your veins:    Leg swelling:         Pulmonary    Oxygen at home:    Productive cough:     Wheezing:         Neurologic    Sudden weakness in arms or legs:     Sudden numbness in arms or legs:     Sudden onset of difficulty speaking or slurred speech:    Temporary loss of vision in one eye:     Problems with dizziness:         Gastrointestinal    Blood in stool:     Vomited blood:         Genitourinary    Burning when urinating:     Blood in urine:        Psychiatric    Major depression:  Hematologic    Bleeding problems:    Problems with blood clotting too easily:        Skin    Rashes or ulcers:        Constitutional    Fever or chills:      PHYSICAL EXAMINATION:  Vitals:   12/09/22 1006 12/11/22 1014  BP:  (!) 147/79  Pulse:  62  Resp:  17  Temp:  97.8 F (36.6 C)  TempSrc:  Oral  SpO2:   97%  Weight: 92.5 kg 92 kg  Height: 5\' 8"  (1.727 m) 5\' 8"  (1.727 m)    General:  WDWN in NAD; vital signs documented above Gait: Not observed HENT: WNL, normocephalic Pulmonary: normal non-labored breathing , without Rales, rhonchi,  wheezing Cardiac: regular HR Abdomen: soft, NT, no masses Skin: without rashes Vascular Exam/Pulses: Venous leg left PT signal Extremities: Necrotic great toe amp site with exposed bone as well as necrotic lateral left foot wound Musculoskeletal: no muscle wasting or atrophy  Neurologic: A&O X 3 Psychiatric:  The pt has Normal affect.   Non-Invasive Vascular Imaging:   SFA stent patent with monophasic sluggish flow Left ABI of 0  ASSESSMENT/PLAN:: 72 y.o. male who returns to clinic as an urgent add-on due to worsening tissue loss of the left foot  Mr. Tsuneo Sonnenberg is a 72 year old man who currently resides in a nursing facility.  He is minimally ambulatory with a walker requiring 1 person assist.  He underwent extensive endovascular revascularization of left lower extremity last month.  Unfortunately he now has extensive tissue loss of left foot.  He even with improved blood flow to the left foot he will not be able to recover from these wounds due to the severity of tissue loss.  He was also evaluated by Dr. Karin Lieu today.  If willing, the patient will require a left above-the-knee amputation.  Dr. Karin Lieu will discuss this with his daughter over the phone.  If agreeable we will schedule this for the near future.   Victorino Sparrow, PA-C [facility] [phone]  Clinic MD:   Karin Lieu

## 2022-12-11 NOTE — Transfer of Care (Signed)
Immediate Anesthesia Transfer of Care Note  Patient: James Dougherty  Procedure(s) Performed: AMPUTATION ABOVE KNEE (Left: Knee)  Patient Location: PACU  Anesthesia Type:General  Level of Consciousness: drowsy  Airway & Oxygen Therapy: Patient Spontanous Breathing and Patient connected to face mask oxygen  Post-op Assessment: Report given to RN and Post -op Vital signs reviewed and stable  Post vital signs: Reviewed and stable  Last Vitals:  Vitals Value Taken Time  BP 145/67 12/11/22 1515  Temp    Pulse 68 12/11/22 1517  Resp 21 12/11/22 1517  SpO2 100 % 12/11/22 1517  Vitals shown include unfiled device data.  Last Pain:  Vitals:   12/11/22 1111  TempSrc:   PainSc: 0-No pain      Patients Stated Pain Goal: 3 (12/11/22 1111)  Complications: No notable events documented.

## 2022-12-12 DIAGNOSIS — Z89619 Acquired absence of unspecified leg above knee: Secondary | ICD-10-CM | POA: Diagnosis not present

## 2022-12-12 LAB — GLUCOSE, CAPILLARY
Glucose-Capillary: 197 mg/dL — ABNORMAL HIGH (ref 70–99)
Glucose-Capillary: 168 mg/dL — ABNORMAL HIGH (ref 70–99)
Glucose-Capillary: 175 mg/dL — ABNORMAL HIGH (ref 70–99)
Glucose-Capillary: 150 mg/dL — ABNORMAL HIGH (ref 70–99)

## 2022-12-12 LAB — COMPREHENSIVE METABOLIC PANEL
ALT: 19 U/L (ref 0–44)
AST: 16 U/L (ref 15–41)
Albumin: 2.8 g/dL — ABNORMAL LOW (ref 3.5–5.0)
Alkaline Phosphatase: 91 U/L (ref 38–126)
Anion gap: 13 (ref 5–15)
BUN: 21 mg/dL (ref 8–23)
CO2: 24 mmol/L (ref 22–32)
Calcium: 8.7 mg/dL — ABNORMAL LOW (ref 8.9–10.3)
Chloride: 100 mmol/L (ref 98–111)
Creatinine, Ser: 1.32 mg/dL — ABNORMAL HIGH (ref 0.61–1.24)
GFR, Estimated: 57 mL/min — ABNORMAL LOW (ref >60)
Glucose, Bld: 194 mg/dL — ABNORMAL HIGH (ref 70–99)
Potassium: 5.1 mmol/L (ref 3.5–5.1)
Sodium: 137 mmol/L (ref 135–145)
Total Bilirubin: 0.8 mg/dL (ref <1.2)
Total Protein: 6.7 g/dL (ref 6.5–8.1)

## 2022-12-12 LAB — CBC
HCT: 42.5 % (ref 39.0–52.0)
Hemoglobin: 13.6 g/dL (ref 13.0–17.0)
MCH: 30.8 pg (ref 26.0–34.0)
MCHC: 32 g/dL (ref 30.0–36.0)
MCV: 96.4 fL (ref 80.0–100.0)
Platelets: 346 10*3/uL (ref 150–400)
RBC: 4.41 MIL/uL (ref 4.22–5.81)
RDW: 13.2 % (ref 11.5–15.5)
WBC: 11.9 10*3/uL — ABNORMAL HIGH (ref 4.0–10.5)
nRBC: 0 % (ref 0.0–0.2)

## 2022-12-12 MED ORDER — MUPIROCIN 2 % EX OINT
1.0000 | TOPICAL_OINTMENT | Freq: Two times a day (BID) | CUTANEOUS | Status: AC
  Administered 2022-12-12 – 2022-12-16 (×10): 1 via NASAL
  Filled 2022-12-12 (×4): qty 22

## 2022-12-12 MED ORDER — CHLORHEXIDINE GLUCONATE CLOTH 2 % EX PADS
6.0000 | MEDICATED_PAD | Freq: Every day | CUTANEOUS | Status: AC
  Administered 2022-12-12 – 2022-12-16 (×5): 6 via TOPICAL

## 2022-12-12 MED ORDER — TRAMADOL HCL 50 MG PO TABS
50.0000 mg | ORAL_TABLET | Freq: Four times a day (QID) | ORAL | Status: DC
  Administered 2022-12-12 – 2022-12-17 (×17): 50 mg via ORAL
  Filled 2022-12-12 (×20): qty 1

## 2022-12-12 NOTE — Progress Notes (Signed)
Pt bladder scan revealed 683 in bladder. Order received for in/out cath.  In/out cath performed without difficulty revealing 750 clear, yellow urine.

## 2022-12-12 NOTE — Progress Notes (Addendum)
  Progress Note    12/12/2022 8:18 AM 1 Day Post-Op  Subjective:  crying in pain in left AKA   Vitals:   12/12/22 0616 12/12/22 0729  BP:  (!) 135/59  Pulse: 77 67  Resp: 20 20  Temp:  97.8 F (36.6 C)  SpO2: 98% 98%   Physical Exam: Cardiac:  regular Lungs:  non labored Incisions:  Left AKA dressings clean, dry and intact Neurologic: alert and oriented  CBC    Component Value Date/Time   WBC 11.9 (H) 12/12/2022 0330   RBC 4.41 12/12/2022 0330   HGB 13.6 12/12/2022 0330   HCT 42.5 12/12/2022 0330   PLT 346 12/12/2022 0330   MCV 96.4 12/12/2022 0330   MCH 30.8 12/12/2022 0330   MCHC 32.0 12/12/2022 0330   RDW 13.2 12/12/2022 0330   LYMPHSABS 1.5 11/05/2021 0743   MONOABS 0.8 11/05/2021 0743   EOSABS 0.5 11/05/2021 0743   BASOSABS 0.0 11/05/2021 0743    BMET    Component Value Date/Time   NA 137 12/12/2022 0330   K 5.1 12/12/2022 0330   CL 100 12/12/2022 0330   CO2 24 12/12/2022 0330   GLUCOSE 194 (H) 12/12/2022 0330   BUN 21 12/12/2022 0330   CREATININE 1.32 (H) 12/12/2022 0330   CALCIUM 8.7 (L) 12/12/2022 0330   GFRNONAA 57 (L) 12/12/2022 0330   GFRAA 47 (L) 04/28/2017 0238    INR    Component Value Date/Time   INR 1.2 12/11/2022 1105     Intake/Output Summary (Last 24 hours) at 12/12/2022 0818 Last data filed at 12/12/2022 7322 Gross per 24 hour  Intake 300 ml  Output 750 ml  Net -450 ml     Assessment/Plan:  72 y.o. male is s/p left AKA 1 Day Post-Op    Left AKA dressings intact. This will stay on until 12/16/22 Pain not well controlled. Dilaudid and Acetaminophen only initial orders for pain control. Added Tramadol Hemodynamically stable PT/OT to eval with recs  Graceann Congress, PA-C [facility] [phone] 12/12/2022 8:18 AM  I have independently interviewed and examined patient and agree with PA assessment and plan above.  Plan to take dressing down on postop day 5.  Julieanna Geraci C. Randie Heinz, MD [facility] [place] Office: [phone] Pager: [phone]

## 2022-12-12 NOTE — Hospital Course (Addendum)
72 yom w/ chronic BLE wounds with lymphedema, T2DM with neuropathy, PAD, HLD,critical right ICA stenosis. But asymptomatic brought into the hospital for elective AKA due to nonhealing left lower extremity wounds and hospitalist service was requested for the admission  post op.He had underwent DCB SFA, lithotripsy popliteal artery, PTX tibioperoneal trunk, DCB right external iliac artery by Dr. 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  He is s/p left AKA 11/15.he has  from that standpoint.  He just underwent AKA.

## 2022-12-12 NOTE — Progress Notes (Signed)
PROGRESS NOTE James Dougherty  RUE:[id number] DOB: [birth year] DOA: 12/11/2022 PCP: [name], MD  Brief Narrative/Hospital Course: 72 yom w/ chronic BLE wounds with lymphedema, T2DM with neuropathy, PAD, HLD,critical right ICA stenosis. But asymptomatic brought into the hospital for elective AKA due to nonhealing left lower extremity wounds and hospitalist service was requested for the admission  post op.He had underwent DCB SFA, lithotripsy popliteal artery, PTX tibioperoneal trunk, DCB right external iliac artery by Dr. 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  He is s/p left AKA 11/15.he has  from that standpoint.  He just underwent AKA.    Subjective: Seen and examined, Overnight vitals/labs/events reviewed  Heart rate varying but no complaints   Assessment and Plan: Principal Problem:   S/P AKA (above knee amputation) unilateral (HCC) Active Problems:   PVD (peripheral vascular disease) (HCC)   PAD (peripheral artery disease) (HCC)   Type II diabetes mellitus with complication (HCC)   Dry gangrene (HCC)   Multiple open wounds of left lower extremity   S/P AKA (above knee amputation) unilateral, left (HCC)   PAD Nonhealing multiple wounds of left lower extremity S/p AKA 11/15: On a statin. Antiplatelets on hold-continue postop care, wound care PT OT as per  Vascular surgery.  Chronic combined systolic and diastolic congestive heart failure: Last TTE May/2023-LVEF 30-40% , g3dd, PTA on Lasix.holding Lasix in postop state, once oral intake okay we will resume, monitor intake output Daily weight    Right ICA stenosis: Known issue, asymptomatic.  Continue per vascular   HLD: Continue statin   T2DM W/ Peripheral neuropathy: On 21 units of Levemir at bedtime and Trulicity and  Jardiance. Started on 15 u Semglee ,SSI and home gabapentin   Essential hypertension: Continue Toprol  CKD3b: B/l creat abt the same.  Obesity:Patient's Body mass index is 30.07 kg/m. : Will benefit with PCP follow-up, weight loss  healthy lifestyle and outpatient sleep evaluation. Pressure injury POA coccyx stage II continue wound care Pressure Injury 12/11/22 Coccyx Stage 2 -  Partial thickness loss of dermis presenting as a shallow open injury with a red, pink wound bed without slough. (Active)  12/11/22 1915  Location: Coccyx  Location Orientation:   Staging: Stage 2 -  Partial thickness loss of dermis presenting as a shallow open injury with a red, pink wound bed without slough.  Wound Description (Comments):   Present on Admission: Yes  Dressing Type Foam - Lift dressing to assess site every shift 12/12/22 0900   DVT prophylaxis: enoxaparin (LOVENOX) injection 40 mg Start: 12/12/22 1500 Code Status:   Code Status: Full Code Family Communication: plan of care discussed with patient at bedside. Patient status is: Inpatient because of postop Level of care: Progressive   Dispo: The patient is from: SNF            Anticipated disposition: SNF ONCE acute vascular  Objective: Vitals last 24 hrs: Vitals:   12/12/22 0855 12/12/22 1010 12/12/22 1019 12/12/22 1118  BP: (!) 143/43 127/66  (!) 141/68  Pulse: 81 76 80 75  Resp:  18 20 17   Temp:    98.4 F (36.9 C)  TempSrc:    Oral  SpO2:  100%  97%  Weight:      Height:       Weight  change:   Physical Examination: General exam: alert awake, older than stated age HEENT:Oral mucosa moist, Ear/Nose WNL grossly Respiratory system: bilaterally clear BS, no use of accessory muscle Cardiovascular system: S1 & S2 +, No JVD. Gastrointestinal system: Abdomen soft,NT,ND, BS+ Nervous System:Alert, awake, moving extremities. Extremities: LE edema neg, lt AKA with w/ occlusive dressing Skin: No rashes,no icterus. MSK: Normal muscle  bulk,tone, power  Medications reviewed:  Scheduled Meds:  atorvastatin  20 mg Oral QHS   Chlorhexidine Gluconate Cloth  6 each Topical Q0600   DULoxetine  30 mg Oral Daily   enoxaparin (LOVENOX) injection  40 mg Subcutaneous Q24H   gabapentin  600 mg Oral TID   insulin aspart  0-15 Units Subcutaneous TID WC   insulin aspart  0-5 Units Subcutaneous QHS   insulin glargine-yfgn  15 Units Subcutaneous QHS   metoprolol succinate  50 mg Oral Daily   mupirocin ointment  1 Application Nasal BID   traMADol  50 mg Oral Q6H   Continuous Infusions:  sodium chloride 100 mL/hr (12/12/22 1030)    Diet Order             Diet heart healthy/carb modified Room service appropriate? No; Fluid consistency: Thin  Diet effective now                   Intake/Output Summary (Last 24 hours) at 12/12/2022 1137 Last data filed at 12/12/2022 1027 Gross per 24 hour  Intake 300 ml  Output 750 ml  Net -450 ml   Net IO Since Admission: -450 mL [12/12/22 1137]  Wt Readings from Last 3 Encounters:  12/12/22 89.7 kg  11/02/22 105.2 kg  10/29/22 96.2 kg     Unresulted Labs (From admission, onward)     Start     Ordered   12/18/22 0500  Creatinine, serum  (enoxaparin (LOVENOX)    CrCl >/= 30 ml/min)  Weekly,   R     Comments: while on enoxaparin therapy    12/11/22 1521          Data Reviewed: I have personally reviewed following labs and imaging studies CBC: Recent Labs  Lab 12/11/22 1105 12/11/22 1934 12/12/22 0330  WBC 10.6* 11.0* 11.9*  HGB 15.0 14.0 13.6  HCT 47.6 43.2 42.5  MCV 96.6 95.8 96.4  PLT 365 375 346   Basic Metabolic Panel: Recent Labs  Lab 12/11/22 1105 12/11/22 1934 12/12/22 0330  NA 137  --  137  K 4.6  --  5.1  CL 98  --  100  CO2 27  --  24  GLUCOSE 171*  --  194*  BUN 15  --  21  CREATININE 1.62* 1.37* 1.32*  CALCIUM 9.0  --  8.7*   GFR: Estimated Creatinine Clearance: 55 mL/min (A) (by C-G formula based on SCr of 1.32 mg/dL (H)). Liver Function  Tests: Recent Labs  Lab 12/11/22 1105 12/12/22 0330  AST 21 16  ALT 22 19  ALKPHOS 99 91  BILITOT 1.0 0.8  PROT 7.4 6.7  ALBUMIN 3.1* 2.8*   No results for input(s): "LIPASE", "AMYLASE" in the last 168 hours. No results for input(s): "AMMONIA" in the last 168 hours. Coagulation Profile: Recent Labs  Lab 12/11/22 1105  INR 1.2   BNP (last 3 results) No results for input(s): "PROBNP" in the last 8760 hours. HbA1C: Recent Labs    12/11/22 1934  HGBA1C 8.4*   CBG: Recent Labs  Lab 12/11/22 1224 12/11/22 1518 12/11/22 2059 12/12/22  7829 12/12/22 1117  GLUCAP 152* 172* 211* 197* 168*   Recent Results (from the past 240 hour(s))  Surgical pcr screen     Status: Abnormal   Collection Time: 12/11/22 11:11 AM   Specimen: Nasal Mucosa; Nasal Swab  Result Value Ref Range Status   MRSA, PCR POSITIVE (A) NEGATIVE Final    Comment: RESULT CALLED TO, READ BACK BY AND VERIFIED WITH: RN R RAPHINOVA [id number] AT 1314 BY CM    Staphylococcus aureus POSITIVE (A) NEGATIVE Final    Comment: (NOTE) The Xpert SA Assay (FDA approved for NASAL specimens in patients 101 years of age and older), is one component of a comprehensive surveillance program. It is not intended to diagnose infection nor to guide or monitor treatment. Performed at [facility], [address] [place], [place] [address]     Antimicrobials: Anti-infectives (From admission, onward)    Start     Dose/Rate Route Frequency Ordered Stop   12/11/22 1128  ceFAZolin (ANCEF) IVPB 2g/100 mL premix        2 g 200 mL/hr over 30 Minutes Intravenous 30 min pre-op 12/11/22 1043 12/11/22 1351      Culture/Microbiology    Component Value Date/Time   SDES BLOOD LEFT HAND 04/01/2017 2304   SPECREQUEST AEROBIC BOTTLE ONLY Blood Culture adequate volume 04/01/2017 2304   CULT  04/01/2017 2304    NO GROWTH 5 DAYS Performed at [facility], [address], [place], [place] [address]    REPTSTATUS 04/07/2017  FINAL 04/01/2017 2304  Radiology Studies: No results found.   LOS: 1 day   Lanae Boast, MD [facility]  12/12/2022, 11:37 AM

## 2022-12-13 DIAGNOSIS — Z89619 Acquired absence of unspecified leg above knee: Secondary | ICD-10-CM | POA: Diagnosis not present

## 2022-12-13 LAB — BASIC METABOLIC PANEL
Anion gap: 8 (ref 5–15)
BUN: 26 mg/dL — ABNORMAL HIGH (ref 8–23)
CO2: 23 mmol/L (ref 22–32)
Calcium: 8.2 mg/dL — ABNORMAL LOW (ref 8.9–10.3)
Chloride: 101 mmol/L (ref 98–111)
Creatinine, Ser: 1.22 mg/dL (ref 0.61–1.24)
GFR, Estimated: >60 mL/min (ref >60)
Glucose, Bld: 184 mg/dL — ABNORMAL HIGH (ref 70–99)
Potassium: 4.6 mmol/L (ref 3.5–5.1)
Sodium: 132 mmol/L — ABNORMAL LOW (ref 135–145)

## 2022-12-13 LAB — GLUCOSE, CAPILLARY
Glucose-Capillary: 171 mg/dL — ABNORMAL HIGH (ref 70–99)
Glucose-Capillary: 162 mg/dL — ABNORMAL HIGH (ref 70–99)
Glucose-Capillary: 153 mg/dL — ABNORMAL HIGH (ref 70–99)
Glucose-Capillary: 174 mg/dL — ABNORMAL HIGH (ref 70–99)
Glucose-Capillary: 195 mg/dL — ABNORMAL HIGH (ref 70–99)

## 2022-12-13 LAB — CBC
HCT: 39 % (ref 39.0–52.0)
Hemoglobin: 12.4 g/dL — ABNORMAL LOW (ref 13.0–17.0)
MCH: 31.1 pg (ref 26.0–34.0)
MCHC: 31.8 g/dL (ref 30.0–36.0)
MCV: 97.7 fL (ref 80.0–100.0)
Platelets: 309 10*3/uL (ref 150–400)
RBC: 3.99 MIL/uL — ABNORMAL LOW (ref 4.22–5.81)
RDW: 13.6 % (ref 11.5–15.5)
WBC: 12.4 10*3/uL — ABNORMAL HIGH (ref 4.0–10.5)
nRBC: 0 % (ref 0.0–0.2)

## 2022-12-13 NOTE — Consult Note (Signed)
WOC Nurse Consult Note: L AKA 11/2022 in the setting of PAD and nonhealing vascular wounds.   Stage 2 pressure injury, present on admission Reason for Consult: Stage 2 pressure injury Wound type: pressure Surgical left AKA site.  Pressure Injury POA: Yes Measurement: 2 cm x 4 cm x 0.1 cm  Wound bed:red and moist Drainage (amount, consistency, odor) scant serosanguinous  no odor Periwound: intact Dressing procedure/placement/frequency: Sacral wound orders per skin care order set for stage 2 pressure injury Silicone foam dressing, change every three days and PRN soilage.  Will not follow at this time.  Please re-consult if needed.  Mike Gip MSN, RN, FNP-BC CWON Wound, Ostomy, Continence Nurse Outpatient [facility] [phone] Pager [phone]

## 2022-12-13 NOTE — Progress Notes (Signed)
PROGRESS NOTE James Dougherty  [name]:[id number] DOB: [birth year] DOA: 12/11/2022 PCP: James Pandy, MD  Brief Narrative/Hospital Course: 72 yom w/ chronic BLE wounds with lymphedema, T2DM with neuropathy, PAD, HLD,critical right ICA stenosis. But asymptomatic brought into the hospital for elective AKA due to nonhealing left lower extremity wounds and hospitalist service was requested for the admission  post op.He had underwent DCB SFA, lithotripsy popliteal artery, PTX tibioperoneal trunk, DCB right external iliac artery by Dr. Karin Dougherty on 10/20/2022, unfortunately he developed necrotic wounds on the foot and great toe amputation site as well as lateral foot position.He is a Nurse, adult patient residing at a facility. He walks with the help of therapy team using walker.  He is s/p left AKA 11/15.he has  from that standpoint.  He just underwent AKA.    Subjective: Patient seen and examined Alert awake, reports his pain is better controlled today Overnight afebrile blood pressure  110s, ON RA Patient had URINE Retention needing In-N-Out cath -750 cc urine removed Labs with mild leukocytosis with a stable hemoglobin and renal function  Assessment and Plan: Principal Problem:   S/P AKA (above knee amputation) unilateral (HCC) Active Problems:   PVD (peripheral vascular disease) (HCC)   PAD (peripheral artery disease) (HCC)   Type II diabetes mellitus with complication (HCC)   Dry gangrene (HCC)   Multiple open wounds of left lower extremity   S/P AKA (above knee amputation) unilateral, left (HCC)   PAD Nonhealing multiple wounds of left lower extremity S/p Left AKA 11/15: On a statin. Antiplatelets on hold.  On occlusive dressing which will stay until 11/20, pain meds being adjusted per vascular surgery on tramadol Dilaudid Tylenol and pain is better controlled.Remains stable monitor hemodynamics, labs, continue PT OT.  Chronic combined systolic and diastolic congestive heart failure: Last TTE  May/2023-LVEF 30-40% , g3dd, PTA on Lasix.Holding Lasix in postop state, once oral intake okay we will resume soon, monitor intake output Daily weight.  Monitor renal functions as well   Right ICA stenosis: Known issue, asymptomatic.  Continue per vascular   HLD: Continue statin   T2DM W/ Peripheral neuropathy: On 21 units of Levemir at bedtime and Trulicity and Jardiance. Started on 15 u Semglee,SSI and home gabapentin. Continue the same. A1c at 8.4 Recent Labs  Lab 12/11/22 1934 12/11/22 2059 12/12/22 1117 12/12/22 1721 12/12/22 2115 12/13/22 0602 12/13/22 0826  GLUCAP  --    < > 168* 175* 150* 171* 162*  HGBA1C 8.4*  --   --   --   --   --   --    < > = values in this interval not displayed.     Essential hypertension: Stable, continue Toprol  CKD3b: B/l creat abt the same. Holding 1.2-1.3. Monitor.  Urine retention: needed In-N-Out cath 11/16. Monitor.  Obesity:Patient's Body mass index is 30.07 kg/m. : Will benefit with PCP follow-up, weight loss  healthy lifestyle and outpatient sleep evaluation. Pressure injury POA coccyx stage II continue wound care Pressure Injury 12/11/22 Coccyx Stage 2 -  Partial thickness loss of dermis presenting as a shallow open injury with a red, pink wound bed without slough. (Active)  12/11/22 1915  Location: Coccyx  Location Orientation:   Staging: Stage 2 -  Partial thickness loss of dermis presenting as a shallow open injury with a red, pink wound bed without slough.  Wound Description (Comments):   Present on Admission: Yes  Dressing Type Foam - Lift dressing to assess site every shift 12/12/22  0900   DVT prophylaxis: enoxaparin (LOVENOX) injection 40 mg Start: 12/12/22 1500 Code Status:   Code Status: Full Code Family Communication: plan of care discussed with patient at bedside. Patient status is: Inpatient because of postop Level of care: Progressive   Dispo: The patient is from: SNF            Anticipated disposition: SNF  ONCE acute vascular  Objective: Vitals last 24 hrs: Vitals:   12/12/22 2353 12/13/22 0327 12/13/22 0805 12/13/22 0806  BP: (!) 118/51 (!) 117/46 (!) 113/95   Pulse: 70 63 63   Resp: 12 15 14    Temp: 97.6 F (36.4 C) 98 F (36.7 C) (!) 97.2 F (36.2 C) 97.6 F (36.4 C)  TempSrc: Axillary Axillary Axillary   SpO2: 100% 97% 100%   Weight:      Height:       Weight change:   Physical Examination: General exam: alert awake, pleasant HEENT:Oral mucosa moist, Ear/Nose WNL grossly Respiratory system: Bilaterally clear BS,no use of accessory muscle Cardiovascular system: S1 & S2 +, No JVD. Gastrointestinal system: Abdomen soft,NT,ND, BS+ Nervous System: Alert, awake, moving all extremities,and following commands. Extremities: LE edema neg, lt LLE w/ AKA w/ occlusive dressing.  Skin: No rashes,no icterus. MSK: Normal muscle bulk,tone, power   Medications reviewed:  Scheduled Meds:  atorvastatin  20 mg Oral QHS   Chlorhexidine Gluconate Cloth  6 each Topical Q0600   DULoxetine  30 mg Oral Daily   enoxaparin (LOVENOX) injection  40 mg Subcutaneous Q24H   gabapentin  600 mg Oral TID   insulin aspart  0-15 Units Subcutaneous TID WC   insulin aspart  0-5 Units Subcutaneous QHS   insulin glargine-yfgn  15 Units Subcutaneous QHS   metoprolol succinate  50 mg Oral Daily   mupirocin ointment  1 Application Nasal BID   traMADol  50 mg Oral Q6H   Continuous Infusions:  sodium chloride 100 mL/hr at 12/13/22 4010    Diet Order             Diet heart healthy/carb modified Room service appropriate? No; Fluid consistency: Thin  Diet effective now                   Intake/Output Summary (Last 24 hours) at 12/13/2022 1035 Last data filed at 12/13/2022 0037 Gross per 24 hour  Intake --  Output 1350 ml  Net -1350 ml   Net IO Since Admission: -1,800 mL [12/13/22 1035]  Wt Readings from Last 3 Encounters:  12/12/22 89.7 kg  11/02/22 105.2 kg  10/29/22 96.2 kg      Unresulted Labs (From admission, onward)     Start     Ordered   12/18/22 0500  Creatinine, serum  (enoxaparin (LOVENOX)    CrCl >/= 30 ml/min)  Weekly,   R     Comments: while on enoxaparin therapy    12/11/22 1521   12/14/22 0500  Basic metabolic panel  Daily,   R      12/13/22 0904   12/14/22 0500  CBC  Daily,   R      12/13/22 0904          Data Reviewed: I have personally reviewed following labs and imaging studies CBC: Recent Labs  Lab 12/11/22 1105 12/11/22 1934 12/12/22 0330 12/13/22 0925  WBC 10.6* 11.0* 11.9* 12.4*  HGB 15.0 14.0 13.6 12.4*  HCT 47.6 43.2 42.5 39.0  MCV 96.6 95.8 96.4 97.7  PLT 365 375 346  309   Basic Metabolic Panel: Recent Labs  Lab 12/11/22 1105 12/11/22 1934 12/12/22 0330 12/13/22 0925  NA 137  --  137 132*  K 4.6  --  5.1 4.6  CL 98  --  100 101  CO2 27  --  24 23  GLUCOSE 171*  --  194* 184*  BUN 15  --  21 26*  CREATININE 1.62* 1.37* 1.32* 1.22  CALCIUM 9.0  --  8.7* 8.2*   GFR: Estimated Creatinine Clearance: 59.5 mL/min (by C-G formula based on SCr of 1.22 mg/dL). Liver Function Tests: Recent Labs  Lab 12/11/22 1105 12/12/22 0330  AST 21 16  ALT 22 19  ALKPHOS 99 91  BILITOT 1.0 0.8  PROT 7.4 6.7  ALBUMIN 3.1* 2.8*   No results for input(s): "LIPASE", "AMYLASE" in the last 168 hours. No results for input(s): "AMMONIA" in the last 168 hours. Coagulation Profile: Recent Labs  Lab 12/11/22 1105  INR 1.2   BNP (last 3 results) No results for input(s): "PROBNP" in the last 8760 hours. HbA1C: Recent Labs    12/11/22 1934  HGBA1C 8.4*   CBG: Recent Labs  Lab 12/12/22 1117 12/12/22 1721 12/12/22 2115 12/13/22 0602 12/13/22 0826  GLUCAP 168* 175* 150* 171* 162*   Recent Results (from the past 240 hour(s))  Surgical pcr screen     Status: Abnormal   Collection Time: 12/11/22 11:11 AM   Specimen: Nasal Mucosa; Nasal Swab  Result Value Ref Range Status   MRSA, PCR POSITIVE (A) NEGATIVE Final     Comment: RESULT CALLED TO, READ BACK BY AND VERIFIED WITH: RN R RAPHINOVA [id number] AT 1314 BY CM    Staphylococcus aureus POSITIVE (A) NEGATIVE Final    Comment: (NOTE) The Xpert SA Assay (FDA approved for NASAL specimens in patients 29 years of age and older), is one component of a comprehensive surveillance program. It is not intended to diagnose infection nor to guide or monitor treatment. Performed at [facility], [address], [place], [place] [address]     Antimicrobials: Anti-infectives (From admission, onward)    Start     Dose/Rate Route Frequency Ordered Stop   12/11/22 1128  ceFAZolin (ANCEF) IVPB 2g/100 mL premix        2 g 200 mL/hr over 30 Minutes Intravenous 30 min pre-op 12/11/22 1043 12/11/22 1351      Culture/Microbiology    Component Value Date/Time   SDES BLOOD LEFT HAND 04/01/2017 2304   SPECREQUEST AEROBIC BOTTLE ONLY Blood Culture adequate volume 04/01/2017 2304   CULT  04/01/2017 2304    NO GROWTH 5 DAYS Performed at [facility], [address] [place], [place] [address]    REPTSTATUS 04/07/2017 FINAL 04/01/2017 2304  Radiology Studies: No results found.   LOS: 2 days   Lanae Boast, MD [facility]  12/13/2022, 10:35 AM

## 2022-12-13 NOTE — Progress Notes (Signed)
Patient was bladder scanned, , on the scan, MD on call made aware and orders received for inserting urethral catheter. Catheter inserted as ordered and  clear urine returned. Tyrae Alcoser, Randall An RN

## 2022-12-14 ENCOUNTER — Encounter (HOSPITAL_COMMUNITY): Payer: Self-pay | Admitting: Vascular Surgery

## 2022-12-14 DIAGNOSIS — Z89619 Acquired absence of unspecified leg above knee: Secondary | ICD-10-CM | POA: Diagnosis not present

## 2022-12-14 LAB — BASIC METABOLIC PANEL
Anion gap: 7 (ref 5–15)
BUN: 23 mg/dL (ref 8–23)
CO2: 21 mmol/L — ABNORMAL LOW (ref 22–32)
Calcium: 7.8 mg/dL — ABNORMAL LOW (ref 8.9–10.3)
Chloride: 106 mmol/L (ref 98–111)
Creatinine, Ser: 1.29 mg/dL — ABNORMAL HIGH (ref 0.61–1.24)
GFR, Estimated: 59 mL/min — ABNORMAL LOW (ref >60)
Glucose, Bld: 152 mg/dL — ABNORMAL HIGH (ref 70–99)
Potassium: 4.6 mmol/L (ref 3.5–5.1)
Sodium: 134 mmol/L — ABNORMAL LOW (ref 135–145)

## 2022-12-14 LAB — CBC
HCT: 39.5 % (ref 39.0–52.0)
Hemoglobin: 12.5 g/dL — ABNORMAL LOW (ref 13.0–17.0)
MCH: 31 pg (ref 26.0–34.0)
MCHC: 31.6 g/dL (ref 30.0–36.0)
MCV: 98 fL (ref 80.0–100.0)
Platelets: 294 10*3/uL (ref 150–400)
RBC: 4.03 MIL/uL — ABNORMAL LOW (ref 4.22–5.81)
RDW: 13.8 % (ref 11.5–15.5)
WBC: 13.1 10*3/uL — ABNORMAL HIGH (ref 4.0–10.5)
nRBC: 0 % (ref 0.0–0.2)

## 2022-12-14 LAB — GLUCOSE, CAPILLARY
Glucose-Capillary: 143 mg/dL — ABNORMAL HIGH (ref 70–99)
Glucose-Capillary: 108 mg/dL — ABNORMAL HIGH (ref 70–99)
Glucose-Capillary: 117 mg/dL — ABNORMAL HIGH (ref 70–99)
Glucose-Capillary: 137 mg/dL — ABNORMAL HIGH (ref 70–99)

## 2022-12-14 LAB — SURGICAL PATHOLOGY

## 2022-12-14 MED ORDER — ASPIRIN 81 MG PO TBEC
81.0000 mg | DELAYED_RELEASE_TABLET | Freq: Every day | ORAL | Status: DC
  Administered 2022-12-14 – 2022-12-18 (×5): 81 mg via ORAL
  Filled 2022-12-14 (×5): qty 1

## 2022-12-14 MED ORDER — POLYETHYLENE GLYCOL 3350 17 G PO PACK
17.0000 g | PACK | Freq: Every day | ORAL | Status: DC
  Administered 2022-12-14 – 2022-12-18 (×5): 17 g via ORAL
  Filled 2022-12-14 (×5): qty 1

## 2022-12-14 NOTE — Progress Notes (Addendum)
  Progress Note    12/14/2022 7:11 AM 3 Days Post-Op  Subjective:  no major complaints. Says he has not had BM since Tuesday   Vitals:   12/13/22 2300 12/14/22 0400  BP: (!) 121/45 (!) 122/58  Pulse: 77 78  Resp: 12 20  Temp: 98 F (36.7 C) 97.9 F (36.6 C)  SpO2: 97% 94%   Physical Exam: Cardiac:  regular Lungs:  non labored Extremities:  left AKA dressings intact, no evidence of bleeding Neurologic: alert and oriented  CBC    Component Value Date/Time   WBC 13.1 (H) 12/14/2022 0521   RBC 4.03 (L) 12/14/2022 0521   HGB 12.5 (L) 12/14/2022 0521   HCT 39.5 12/14/2022 0521   PLT 294 12/14/2022 0521   MCV 98.0 12/14/2022 0521   MCH 31.0 12/14/2022 0521   MCHC 31.6 12/14/2022 0521   RDW 13.8 12/14/2022 0521   LYMPHSABS 1.5 11/05/2021 0743   MONOABS 0.8 11/05/2021 0743   EOSABS 0.5 11/05/2021 0743   BASOSABS 0.0 11/05/2021 0743    BMET    Component Value Date/Time   NA 134 (L) 12/14/2022 0521   K 4.6 12/14/2022 0521   CL 106 12/14/2022 0521   CO2 21 (L) 12/14/2022 0521   GLUCOSE 152 (H) 12/14/2022 0521   BUN 23 12/14/2022 0521   CREATININE 1.29 (H) 12/14/2022 0521   CALCIUM 7.8 (L) 12/14/2022 0521   GFRNONAA 59 (L) 12/14/2022 0521   GFRAA 47 (L) 04/28/2017 0238    INR    Component Value Date/Time   INR 1.2 12/11/2022 1105     Intake/Output Summary (Last 24 hours) at 12/14/2022 0711 Last data filed at 12/14/2022 0600 Gross per 24 hour  Intake 5666.21 ml  Output 1750 ml  Net 3916.21 ml     Assessment/Plan:  72 y.o. male is s/p left AKA 3 Days Post-Op   Pain much better controlled Left AKA dressing intact, no signs of bleeding  Hemodynamically stable PT/OT ordered to eval Dressing will stay on left AKA until 11/20   DVT prophylaxis:  Lovenox   Graceann Congress, PA-C [facility] [phone] 12/14/2022 7:11 AM  VASCULAR STAFF ADDENDUM: I have independently interviewed and examined the patient. I agree with the  above.  Dressing down Wednesday or prior to DC if earlier. Appreciate WOCN recs regarding sacral wound that was present at admission PT OT for outpt recommendations  Victorino Sparrow MD [facility] Phone Number: [phone] 12/14/2022 9:41 AM

## 2022-12-14 NOTE — Anesthesia Postprocedure Evaluation (Signed)
Anesthesia Post Note  Patient: James Dougherty  Procedure(s) Performed: AMPUTATION ABOVE KNEE (Left: Knee)     Patient location during evaluation: PACU Anesthesia Type: General Level of consciousness: awake and alert Pain management: pain level controlled Vital Signs Assessment: post-procedure vital signs reviewed and stable Respiratory status: spontaneous breathing, nonlabored ventilation, respiratory function stable and patient connected to nasal cannula oxygen Cardiovascular status: blood pressure returned to baseline and stable Postop Assessment: no apparent nausea or vomiting Anesthetic complications: no   No notable events documented.  Last Vitals:    Last Pain:   Pain Goal:                  Collene Schlichter

## 2022-12-14 NOTE — Progress Notes (Signed)
PROGRESS NOTE James Dougherty  XBM:[id number] DOB: 05-17-1950 DOA: 12/11/2022 PCP: [name], MD  Brief Narrative/Hospital Course: 72 yom w/ chronic BLE wounds with lymphedema, T2DM with neuropathy, PAD, HLD,critical right ICA stenosis. But asymptomatic brought into the hospital for elective AKA due to nonhealing left lower extremity wounds and hospitalist service was requested for the admission  post op.He had underwent DCB SFA, lithotripsy popliteal artery, PTX tibioperoneal trunk, DCB right external iliac artery by Dr. 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  He is s/p left AKA 11/15.he has  from that standpoint.  He just underwent AKA.    Subjective: Patient seen and examined this morning is resting comfortably having constipation pain better managed now  Dressing in place on his left AKA  Remains afebrile BP stable Hemoglobin stable  Assessment and Plan: Principal Problem:   S/P AKA (above knee amputation) unilateral (HCC) Active Problems:   PVD (peripheral vascular disease) (HCC)   PAD (peripheral artery disease) (HCC)   Type II diabetes mellitus with complication (HCC)   Dry gangrene (HCC)   Multiple open wounds of left lower extremity   S/P AKA (above knee amputation) unilateral, left (HCC)   PAD Nonhealing multiple wounds of left lower extremity 72 p Left AKA 11/15: Continue atorvastatin.  Continue pain management, continue occlusive dressing as per vascular-planning for dressing takedown on Wednesday. Cont pain control with tramadol Dilaudid Tylenol cont PTOT. Discussed with vascular PA this morning.  Chronic combined systolic and diastolic congestive heart failure: Last TTE May/2023-LVEF 30-40% , g3dd, PTA on Lasix.Holding Lasix in postop state, once oral intake okay we  will resume soon, monitor intake output Daily weight.  Monitor renal functions as well   Right ICA stenosis: Known issue, asymptomatic.  Continue per vascular-patient's aspirin and Plavix.  He has not S/P discussed with vascular-okay to resume   HLD: Continue statin  Constipation: Nurse to give PR Dulcolax, start MiraLAX daily.  T2DM W/ Peripheral neuropathy: On 21 units of Levemir at bedtime and Trulicity and Jardiance. Started on 15 u Semglee,SSI and home gabapentin. Continue the same. A1c at 8.4 Recent Labs  Lab 12/11/22 1934 12/11/22 2059 12/13/22 0826 12/13/22 1206 12/13/22 1611 12/13/22 2038 12/14/22 0549  GLUCAP  --    < > 162* 153* 174* 195* 143*  HGBA1C 8.4*  --   --   --   --   --   --    < > = values in this interval not displayed.     Essential hypertension: Well-controlled on Toprol  CKD3b: B/l creat abt the same. Holding 1.2-1.3.-Continue to monitor.   Urine retention: needed In-N-Out cath 11/16. Monitor.  Obesity:Patient's Body mass index is 31.51 kg/m. : Will benefit with PCP follow-up, weight loss  healthy lifestyle and outpatient sleep evaluation. Pressure injury POA coccyx stage II continue wound care Pressure Injury 12/11/22 Coccyx Stage 2 -  Partial thickness loss of dermis presenting as a shallow open injury with a red, pink wound bed without slough. (Active)  12/11/22 1915  Location: Coccyx  Location Orientation:   Staging: Stage 2 -  Partial thickness loss of dermis presenting as a shallow open injury with a red, pink wound bed without slough.  Wound Description (Comments):   Present on Admission: Yes  Dressing Type Foam - Lift dressing to  assess site every shift 12/12/22 0900   DVT prophylaxis: enoxaparin (LOVENOX) injection 40 mg Start: 12/12/22 1500 Code Status:   Code Status: Full Code Family Communication: plan of care discussed with patient at bedside. Patient status is: Inpatient because of postop Level of care: Progressive   Dispo:  The patient is from: SNF            Anticipated disposition: SNF [facility] OR EARLIER ONCE DRESSING IS REMOVED  Objective: Vitals last 24 hrs: Vitals:   12/13/22 2300 12/14/22 0400 12/14/22 0525 12/14/22 0806  BP: (!) 121/45 (!) 122/58  (!) 123/93  Pulse: 77 78  84  Resp: 12 20  16   Temp: 98 F (36.7 C) 97.9 F (36.6 C)  98.4 F (36.9 C)  TempSrc: Axillary Axillary  Oral  SpO2: 97% 94%  95%  Weight:   94 kg   Height:       Weight change:   Physical Examination: General exam: alert awake, oriented  HEENT:Oral mucosa moist, Ear/Nose WNL grossly Respiratory system: Bilaterally clear BS,no use of accessory muscle Cardiovascular system: S1 & S2 +, No JVD. Gastrointestinal system: Abdomen soft,NT,ND, BS+ Nervous System: Alert, awake, moving all extremities,and following commands. Extremities: LT aka W/ DRESSING IN PLACE  Skin: No rashes,no icterus. MSK: Normal muscle bulk,tone, power   Medications reviewed:  Scheduled Meds:  atorvastatin  20 mg Oral QHS   Chlorhexidine Gluconate Cloth  6 each Topical Q0600   DULoxetine  30 mg Oral Daily   enoxaparin (LOVENOX) injection  40 mg Subcutaneous Q24H   gabapentin  600 mg Oral TID   insulin aspart  0-15 Units Subcutaneous TID WC   insulin aspart  0-5 Units Subcutaneous QHS   insulin glargine-yfgn  15 Units Subcutaneous QHS   metoprolol succinate  50 mg Oral Daily   mupirocin ointment  1 Application Nasal BID   polyethylene glycol  17 g Oral Daily   traMADol  50 mg Oral Q6H   Continuous Infusions:  Diet Order             Diet heart healthy/carb modified Room service appropriate? No; Fluid consistency: Thin  Diet effective now                   Intake/Output Summary (Last 24 hours) at 12/14/2022 1116 Last data filed at 12/14/2022 0600 Gross per 24 hour  Intake 5426.21 ml  Output 1750 ml  Net 3676.21 ml   Net IO Since Admission: 2,116.21 mL [12/14/22 1116]  Wt Readings from Last 3 Encounters:  12/14/22 94 kg   11/02/22 105.2 kg  10/29/22 96.2 kg     Unresulted Labs (From admission, onward)     Start     Ordered   12/18/22 0500  Creatinine, serum  (enoxaparin (LOVENOX)    CrCl >/= 30 ml/min)  Weekly,   R     Comments: while on enoxaparin therapy    12/11/22 1521   12/14/22 0500  Basic metabolic panel  Daily,   R      12/13/22 0904   12/14/22 0500  CBC  Daily,   R      12/13/22 0904          Data Reviewed: I have personally reviewed following labs and imaging studies CBC: Recent Labs  Lab 12/11/22 1105 12/11/22 1934 12/12/22 0330 12/13/22 0925 12/14/22 0521  WBC 10.6* 11.0* 11.9* 12.4* 13.1*  HGB 15.0 14.0 13.6 12.4* 12.5*  HCT 47.6 43.2 42.5 39.0 39.5  MCV 96.6  95.8 96.4 97.7 98.0  PLT 365 375 346 309 294   Basic Metabolic Panel: Recent Labs  Lab 12/11/22 1105 12/11/22 1934 12/12/22 0330 12/13/22 0925 12/14/22 0521  NA 137  --  137 132* 134*  K 4.6  --  5.1 4.6 4.6  CL 98  --  100 101 106  CO2 27  --  24 23 21*  GLUCOSE 171*  --  194* 184* 152*  BUN 15  --  21 26* 23  CREATININE 1.62* 1.37* 1.32* 1.22 1.29*  CALCIUM 9.0  --  8.7* 8.2* 7.8*   GFR: Estimated Creatinine Clearance: 57.5 mL/min (A) (by C-G formula based on SCr of 1.29 mg/dL (H)). Liver Function Tests: Recent Labs  Lab 12/11/22 1105 12/12/22 0330  AST 21 16  ALT 22 19  ALKPHOS 99 91  BILITOT 1.0 0.8  PROT 7.4 6.7  ALBUMIN 3.1* 2.8*   No results for input(s): "LIPASE", "AMYLASE" in the last 168 hours. No results for input(s): "AMMONIA" in the last 168 hours. Coagulation Profile: Recent Labs  Lab 12/11/22 1105  INR 1.2   BNP (last 3 results) No results for input(s): "PROBNP" in the last 8760 hours. HbA1C: Recent Labs    12/11/22 1934  HGBA1C 8.4*   CBG: Recent Labs  Lab 12/13/22 0826 12/13/22 1206 12/13/22 1611 12/13/22 2038 12/14/22 0549  GLUCAP 162* 153* 174* 195* 143*   Recent Results (from the past 240 hour(s))  Surgical pcr screen     Status: Abnormal   Collection  Time: 12/11/22 11:11 AM   Specimen: Nasal Mucosa; Nasal Swab  Result Value Ref Range Status   MRSA, PCR POSITIVE (A) NEGATIVE Final    Comment: RESULT CALLED TO, READ BACK BY AND VERIFIED WITH: RN R RAPHINOVA [id number] AT 1314 BY CM    Staphylococcus aureus POSITIVE (A) NEGATIVE Final    Comment: (NOTE) The Xpert SA Assay (FDA approved for NASAL specimens in patients 29 years of age and older), is one component of a comprehensive surveillance program. It is not intended to diagnose infection nor to guide or monitor treatment. Performed at [facility], [address] [place], [place] [address]     Antimicrobials: Anti-infectives (From admission, onward)    Start     Dose/Rate Route Frequency Ordered Stop   12/11/22 1128  ceFAZolin (ANCEF) IVPB 2g/100 mL premix        2 g 200 mL/hr over 30 Minutes Intravenous 30 min pre-op 12/11/22 1043 12/11/22 1351      Culture/Microbiology    Component Value Date/Time   SDES BLOOD LEFT HAND 04/01/2017 2304   SPECREQUEST AEROBIC BOTTLE ONLY Blood Culture adequate volume 04/01/2017 2304   CULT  04/01/2017 2304    NO GROWTH 5 DAYS Performed at [facility], [address], [place], [place] [address]    REPTSTATUS 04/07/2017 FINAL 04/01/2017 2304  Radiology Studies: No results found.   LOS: 3 days   Lanae Boast, MD [facility]  12/14/2022, 11:16 AM

## 2022-12-14 NOTE — Care Management Important Message (Signed)
Important Message  Patient Details  Name: James Dougherty MRN: [id number] Date of Birth: 05/11/1950   Important Message Given:  Yes - Medicare IM     Dorena Bodo 12/14/2022, 3:44 PM

## 2022-12-14 NOTE — Plan of Care (Signed)
POC progressing.  

## 2022-12-15 DIAGNOSIS — Z89619 Acquired absence of unspecified leg above knee: Secondary | ICD-10-CM | POA: Diagnosis not present

## 2022-12-15 LAB — BASIC METABOLIC PANEL
Anion gap: 7 (ref 5–15)
BUN: 19 mg/dL (ref 8–23)
CO2: 23 mmol/L (ref 22–32)
Calcium: 8.1 mg/dL — ABNORMAL LOW (ref 8.9–10.3)
Chloride: 104 mmol/L (ref 98–111)
Creatinine, Ser: 1.09 mg/dL (ref 0.61–1.24)
GFR, Estimated: >60 mL/min (ref >60)
Glucose, Bld: 118 mg/dL — ABNORMAL HIGH (ref 70–99)
Potassium: 4.4 mmol/L (ref 3.5–5.1)
Sodium: 134 mmol/L — ABNORMAL LOW (ref 135–145)

## 2022-12-15 LAB — CBC
HCT: 40.3 % (ref 39.0–52.0)
Hemoglobin: 12.9 g/dL — ABNORMAL LOW (ref 13.0–17.0)
MCH: 30.7 pg (ref 26.0–34.0)
MCHC: 32 g/dL (ref 30.0–36.0)
MCV: 96 fL (ref 80.0–100.0)
Platelets: 304 10*3/uL (ref 150–400)
RBC: 4.2 MIL/uL — ABNORMAL LOW (ref 4.22–5.81)
RDW: 13.9 % (ref 11.5–15.5)
WBC: 11.6 10*3/uL — ABNORMAL HIGH (ref 4.0–10.5)
nRBC: 0 % (ref 0.0–0.2)

## 2022-12-15 LAB — GLUCOSE, CAPILLARY
Glucose-Capillary: 119 mg/dL — ABNORMAL HIGH (ref 70–99)
Glucose-Capillary: 138 mg/dL — ABNORMAL HIGH (ref 70–99)
Glucose-Capillary: 116 mg/dL — ABNORMAL HIGH (ref 70–99)
Glucose-Capillary: 115 mg/dL — ABNORMAL HIGH (ref 70–99)

## 2022-12-15 MED ORDER — HYDROMORPHONE HCL 1 MG/ML IJ SOLN
0.5000 mg | Freq: Once | INTRAMUSCULAR | Status: AC
  Administered 2022-12-15: 0.5 mg via INTRAVENOUS
  Filled 2022-12-15: qty 0.5

## 2022-12-15 NOTE — TOC Initial Note (Addendum)
Transition of Care [facility]) - Initial/Assessment Note    Patient Details  Name: James Dougherty MRN: [id number] Date of Birth: 01-10-1951  Transition of Care [facility]) CM/SW Contact:    Eduard Roux, LCSW Phone Number: 12/15/2022, 5:08 PM  Clinical Narrative:                  CSW met with patient at bedside. CSW introduced self and explained role. Patient confirmed he arrived from [facility].  Patient was confused about discharge plan SNF verses CIR Patient requested CSW call his daughter.  CSW spoke patient's daughter. She states the patient was at [facility] for short term rehab. His son works during the day and home with patient at night. She states she was hopeful for the patient to go to CIR as recommended, per her report, by the Vascular Surgeon. CSW explained, no recommendations by therapy, including, CIR has their own protocol/screening process, would need insurance approval and patient would not be able to d/c to CIR and then return SNF, etc. Including, patient would not have 24/7 care as requested by CIR. After further discussion and clarification of the family needs/goals she requested to move forward with patient returning to SNF once medically stable. She states he is applying for LTC medicaid. She states the goal is for the patient to return home but she can not say with certainty that will be the plan.  TOC will follow up  w/ [facility] for availability once stable for d/c.  TOC will continue to follow an assist with discharge planning.  Antony Blackbird, MSW, LCSW Clinical Social Worker     Expected Discharge Plan: Skilled Nursing Facility Barriers to Discharge: Continued Medical Work up   Patient Goals and CMS Choice            Expected Discharge Plan and Services In-house Referral: Clinical Social Work     Living arrangements for the past 2 months: Skilled Nursing Facility                                      Prior Living  Arrangements/Services Living arrangements for the past 2 months: Skilled Nursing Facility Lives with:: Self Patient language and need for interpreter reviewed:: No        Need for Family Participation in Patient Care: Yes (Comment) Care giver support system in place?: Yes (comment)   Criminal Activity/Legal Involvement Pertinent to Current Situation/Hospitalization: No - Comment as needed  Activities of Daily Living   ADL Screening (condition at time of admission) Independently performs ADLs?: Yes (appropriate for developmental age) Is the patient deaf or have difficulty hearing?: Yes Does the patient have difficulty seeing, even when wearing glasses/contacts?: No Does the patient have difficulty concentrating, remembering, or making decisions?: No  Permission Sought/Granted Permission sought to share information with : Family Supports Permission granted to share information with : Yes, Verbal Permission Granted  Share Information with NAME: Carlton Adam  Permission granted to share info w AGENCY: SNFs  Permission granted to share info w Relationship: daughter  Permission granted to share info w Contact Information: [phone]  Emotional Assessment Appearance:: Appears stated age Attitude/Demeanor/Rapport: Engaged Affect (typically observed): Appropriate Orientation: : Oriented to Self, Oriented to Place, Oriented to  Time, Oriented to Situation Alcohol / Substance Use: Not Applicable Psych Involvement: No (comment)  Admission diagnosis:  S/P AKA (above knee amputation) unilateral (HCC) [Z89.619] Patient Active Problem  List   Diagnosis Date Noted   Multiple open wounds of left lower extremity 12/11/2022   S/P AKA (above knee amputation) unilateral, left (HCC) 12/11/2022   S/P AKA (above knee amputation) unilateral (HCC) 12/11/2022   Lymphedema 12/03/2021   Neuropathic pain 12/03/2021   TBI (traumatic brain injury) (HCC) 11/04/2021   Subdural hematoma (HCC) 10/28/2021    Gangrene (HCC) 04/26/2017   Cellulitis 04/01/2017   Gangrene of toe (HCC) 04/01/2017   Anemia 04/01/2017   Hyperkalemia 04/01/2017   Chronic kidney disease 04/01/2017   Type II diabetes mellitus with complication (HCC) 04/01/2017   Dry gangrene (HCC) 04/01/2017   Groin pain 07/04/2014   Peripheral vascular disease, unspecified (HCC) 06/27/2013   PAD (peripheral artery disease) (HCC) 12/27/2012   PVD (peripheral vascular disease) (HCC) 12/12/2012   Atherosclerosis of native arteries of the extremities with ulceration (HCC) 03/21/2012   Pain in limb 03/21/2012   PCP:  [name], MD Pharmacy:   [facility] [address] [place] [address] [place] [address] Phone: [phone] Fax: [phone]  [facility] [place], [place] - [address] [place] [address] Phone: [phone] Fax: [phone]  [facility] [address] [place] [address] Phone: [phone] Fax: [phone]     Social Determinants of Health (SDOH) Social History: SDOH Screenings   Food Insecurity: No Food Insecurity (12/15/2022)  Housing: Low Risk  (12/15/2022)  Transportation Needs: No Transportation Needs (12/15/2022)  Utilities: Not At Risk (12/15/2022)  Depression (PHQ2-9): Low Risk  (12/03/2021)  Financial Resource Strain: Patient Declined (10/20/2022)   Received from [facility]  Physical Activity: Inactive (10/20/2022)   Received from [facility]  Social Connections: Patient Declined (10/20/2022)   Received from [facility]  Stress: No Stress Concern Present (10/20/2022)   Received from [facility]  Tobacco Use: Medium Risk (12/11/2022)  Health Literacy: Medium Risk (11/02/2022)   Received from [facility]   SDOH Interventions:     Readmission Risk Interventions     No data to display

## 2022-12-15 NOTE — Evaluation (Signed)
Physical Therapy Evaluation Patient Details Name: James Dougherty MRN: [id number] DOB: December 23, 1950 Today's Date: 12/15/2022  History of Present Illness  72 yo SNF resident admitted 11/15 with nonhealing Lt 1st ray amputation s/p Lt AKA same date. PMhx: HTN, HLD, neuropathy, PAD, psoriasis, CVA, T2DM  Clinical Impression  Pt presenting with decreased bed mobility and unable to ambulate at this time. Pt reports being at a SNF for 3 weeks prior to admission, stated he was getting around with RW and some assistance to get OOB. Pt required mod+2 assistance to get seated EOB, was unsteady seated EOB requiring support to stay upright, shows weakness in RLE and would likely not tolerate standing on single extremity at this time. Pt would benefit continued skilled acute physical therapy to maximize function and independence. Patient will benefit from continued inpatient follow up therapy, <3 hours/day         If plan is discharge home, recommend the following: Two people to help with walking and/or transfers;A lot of help with bathing/dressing/bathroom;Assist for transportation;Assistance with cooking/housework   Can travel by private vehicle   No    Equipment Recommendations None recommended by PT  Recommendations for Other Services  OT consult    Functional Status Assessment Patient has had a recent decline in their functional status and demonstrates the ability to make significant improvements in function in a reasonable and predictable amount of time.     Precautions / Restrictions Precautions Precautions: Fall Restrictions Weight Bearing Restrictions: Yes LLE Weight Bearing: Non weight bearing      Mobility  Bed Mobility Overal bed mobility: Needs Assistance Bed Mobility: Rolling, Sidelying to Sit, Sit to Supine Rolling: Mod assist Sidelying to sit: Mod assist, +2 for physical assistance, HOB elevated, Used rails   Sit to supine: Mod assist, +2 for physical assistance   General  bed mobility comments: Pt required assistance to move RLE OOB, modA +2 to elevate trunk, modA+ to return to supine with pad and trunk guidance    Transfers                   General transfer comment: Did not attempt, pt unable to complete LAQ and unsteady at EOB    Ambulation/Gait                  Stairs            Wheelchair Mobility     Tilt Bed    Modified Rankin (Stroke Patients Only)       Balance Overall balance assessment: Needs assistance Sitting-balance support: Bilateral upper extremity supported, Feet supported Sitting balance-Leahy Scale: Poor Sitting balance - Comments: Pt seated EOB required therapist to maintain upright posture, posterior bias Postural control: Posterior lean   Standing balance-Leahy Scale: Zero                               Pertinent Vitals/Pain Pain Assessment Pain Assessment: Faces Faces Pain Scale: Hurts whole lot Pain Descriptors / Indicators: Constant, Guarding Pain Intervention(s): Limited activity within patient's tolerance, Monitored during session, Repositioned    Home Living Family/patient expects to be discharged to:: Skilled nursing facility                        Prior Function Prior Level of Function : Patient poor historian/Family not available;Needs assist             Mobility Comments: use  RW, stated at SNF would occasionally use lift when moving to chair and after soiled       Extremity/Trunk Assessment   Upper Extremity Assessment Upper Extremity Assessment: Defer to OT evaluation    Lower Extremity Assessment Lower Extremity Assessment: Generalized weakness;LLE deficits/detail LLE Deficits / Details: AKA       Communication   Communication Communication: Hearing impairment Cueing Techniques: Verbal cues;Tactile cues  Cognition Arousal: Alert Behavior During Therapy: Anxious Overall Cognitive Status: No family/caregiver present to determine baseline  cognitive functioning                                 General Comments: HOH, difficult to get PLOF        General Comments      Exercises General Exercises - Lower Extremity Long Arc Quad: AROM, Right, 10 reps, Seated (required therapist support to maintain upright seated posture, limited ROM)   Assessment/Plan    PT Assessment Patient needs continued PT services  PT Problem List Decreased strength;Decreased range of motion;Decreased activity tolerance;Decreased balance;Decreased mobility;Decreased coordination;Decreased knowledge of use of DME;Decreased safety awareness       PT Treatment Interventions Gait training;Functional mobility training;DME instruction;Therapeutic activities;Therapeutic exercise;Balance training;Wheelchair mobility training;Patient/family education    PT Goals (Current goals can be found in the Care Plan section)       Frequency Min 1X/week     Co-evaluation               AM-PAC PT "6 Clicks" Mobility  Outcome Measure Help needed turning from your back to your side while in a flat bed without using bedrails?: A Lot Help needed moving from lying on your back to sitting on the side of a flat bed without using bedrails?: A Lot Help needed moving to and from a bed to a chair (including a wheelchair)?: Total Help needed standing up from a chair using your arms (e.g., wheelchair or bedside chair)?: Total Help needed to walk in hospital room?: Total Help needed climbing 3-5 steps with a railing? : Total 6 Click Score: 8    End of Session   Activity Tolerance: Patient limited by pain Patient left: in bed;with bed alarm set;with call bell/phone within reach Nurse Communication: Patient requests pain meds PT Visit Diagnosis: Unsteadiness on feet (R26.81);Other abnormalities of gait and mobility (R26.89);Muscle weakness (generalized) (M62.81);Difficulty in walking, not elsewhere classified (R26.2)    Time: 6387-5643 PT Time  Calculation (min) (ACUTE ONLY): 21 min   Charges:   PT Evaluation $PT Eval Moderate Complexity: 1 Mod   PT General Charges $$ ACUTE PT VISIT: 1 Visit         Sedonia Small, W. SPT Secure chat preferred   Darlin Drop 12/15/2022, 1:12 PM

## 2022-12-15 NOTE — Progress Notes (Addendum)
  Progress Note    12/15/2022 7:30 AM 4 Days Post-Op  Subjective:  says pain is much better controlled. Feels like he is in bad position in bed, can't see TV    Vitals:   12/14/22 2311 12/15/22 0335  BP: 133/76 (!) 142/93  Pulse: (!) 57 70  Resp: 16 15  Temp: 98.6 F (37 C) 98.2 F (36.8 C)  SpO2: 93% 96%   Physical Exam: Cardiac:  regular Lungs:  non labored Incisions:  left AKA dressings in place Neurologic: alert and oriented  CBC    Component Value Date/Time   WBC 11.6 (H) 12/15/2022 0535   RBC 4.20 (L) 12/15/2022 0535   HGB 12.9 (L) 12/15/2022 0535   HCT 40.3 12/15/2022 0535   PLT 304 12/15/2022 0535   MCV 96.0 12/15/2022 0535   MCH 30.7 12/15/2022 0535   MCHC 32.0 12/15/2022 0535   RDW 13.9 12/15/2022 0535   LYMPHSABS 1.5 11/05/2021 0743   MONOABS 0.8 11/05/2021 0743   EOSABS 0.5 11/05/2021 0743   BASOSABS 0.0 11/05/2021 0743    BMET    Component Value Date/Time   NA 134 (L) 12/15/2022 0535   K 4.4 12/15/2022 0535   CL 104 12/15/2022 0535   CO2 23 12/15/2022 0535   GLUCOSE 118 (H) 12/15/2022 0535   BUN 19 12/15/2022 0535   CREATININE 1.09 12/15/2022 0535   CALCIUM 8.1 (L) 12/15/2022 0535   GFRNONAA >60 12/15/2022 0535   GFRAA 47 (L) 04/28/2017 0238    INR    Component Value Date/Time   INR 1.2 12/11/2022 1105     Intake/Output Summary (Last 24 hours) at 12/15/2022 0730 Last data filed at 12/15/2022 0541 Gross per 24 hour  Intake --  Output 2400 ml  Net -2400 ml     Assessment/Plan:  72 y.o. male is s/p left AKA 4 Days Post-Op   Pain well controlled Left AKA dressings intact, take down tomorrow Recommend PT/OT to eval   Graceann Congress, PA-C [facility] [phone] 12/15/2022 7:30 AM  VASCULAR STAFF ADDENDUM: I have independently interviewed and examined the patient. I agree with the above.  Dressing down tomorrow. Will call daughter today  Pending PT/ OT recs  Victorino Sparrow MD [facility] Phone Number: [phone] 12/15/2022 8:23 AM

## 2022-12-15 NOTE — Progress Notes (Signed)
PROGRESS NOTE DIOGO RYDALCH  ZOX:[id number] DOB: 1950-11-04 DOA: 12/11/2022 PCP: [name], MD  Brief Narrative/Hospital Course: 72 yom w/ chronic BLE wounds with lymphedema, T2DM with neuropathy, PAD, HLD,critical right ICA stenosis. But asymptomatic brought into the hospital for elective AKA due to nonhealing left lower extremity wounds and hospitalist service was requested for the admission  post op.He had underwent DCB SFA, lithotripsy popliteal artery, PTX tibioperoneal trunk, DCB right external iliac artery by Dr. 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  He is s/p left AKA 11/15.he has  from that standpoint.  He just underwent AKA.    Subjective: Patient seen and examined.  Resting comfortably Patient having a lot of pain this morning extra dose of 0.5 mg Dilaudid IV given  No other issues overnight  Hemoglobin stable  Assessment and Plan: Principal Problem:   S/P AKA (above knee amputation) unilateral (HCC) Active Problems:   PVD (peripheral vascular disease) (HCC)   PAD (peripheral artery disease) (HCC)   Type II diabetes mellitus with complication (HCC)   Dry gangrene (HCC)   Multiple open wounds of left lower extremity   S/P AKA (above knee amputation) unilateral, left (HCC)   PAD Nonhealing multiple wounds of left lower extremity S/p Left AKA 11/15: Asa started 11/18. Continue atorvastatin.  Continue pain control with oral/IV narcotics and opiates. Continue occlusive dressing as per vascular-planning for dressing takedown on Wednesday. Cont PTOT.   Chronic combined systolic and diastolic congestive heart failure: Last TTE May/2023-LVEF 30-40% , g3dd, PTA on Lasix.Holding Lasix in postop state, once oral intake okay we will resume soon, monitor intake output Daily  weight.  Monitor renal functions as well   Right ICA stenosis: Known issue, asymptomatic. Asa resume 11/18 and okay to resume Plavix 11/20 per vascular.    HLD: Continue statin  Constipation: Cont PR Dulcolax, started MiraLAX daily 11/18. Last BM 11/12.  T2DM W/ Peripheral neuropathy: PTA on levemir at bedtime and Trulicity and Jardiance. Started on 15 u Semglee,SSI and home gabapentin. Continue the same. A1c at 8.4 Recent Labs  Lab 12/11/22 1934 12/11/22 2059 12/14/22 1135 12/14/22 1633 12/14/22 2120 12/15/22 0544 12/15/22 1116  GLUCAP  --    < > 108* 117* 137* 119* 138*  HGBA1C 8.4*  --   --   --   --   --   --    < > = values in this interval not displayed.     Essential hypertension: Stable on Toprol  CKD3b: B/l creat abt the same. Holding 1.2-1.3.-Continue to monitor.   Urine retention: needed In-N-Out cath 11/16.  And subsequently Foley placed monitor. VOIDING TRAIL in few days vs cont Foley at SNF for 1 wk  Obesity:Patient's Body mass index is 31.51 kg/m. : Will benefit with PCP follow-up, weight loss  healthy lifestyle and outpatient sleep evaluation. Pressure injury POA coccyx stage II continue wound care Pressure Injury 12/11/22 Coccyx Stage 2 -  Partial thickness loss of dermis presenting as a shallow open injury with a red, pink wound bed without slough. (Active)  12/11/22 1915  Location: Coccyx  Location Orientation:   Staging: Stage 2 -  Partial thickness loss of dermis presenting as a shallow open injury with a red, pink wound bed without slough.  Wound Description (Comments):   Present on  Admission: Yes  Dressing Type Foam - Lift dressing to assess site every shift 12/12/22 0900   DVT prophylaxis: enoxaparin (LOVENOX) injection 40 mg Start: 12/12/22 1500 Code Status:   Code Status: Full Code Family Communication: plan of care discussed with patient at bedside. Patient status is: Inpatient because of postop Level of care: Progressive   Dispo: The  patient is from: SNF            Anticipated disposition: SNF [facility] OR EARLIER ONCE DRESSING IS REMOVED  Objective: Vitals last 24 hrs: Vitals:   12/15/22 1000 12/15/22 1100 12/15/22 1103 12/15/22 1118  BP:   132/66   Pulse: 64 (!) 56 65 86  Resp: 13 15 (!) 21 16  Temp:   97.6 F (36.4 C)   TempSrc:   Oral   SpO2: 94% 93% 96% 93%  Weight:      Height:       Weight change:   Physical Examination: General exam: alert awake, oriented at baseline, 72 older than stated age HEENT:Oral mucosa moist, Ear/Nose WNL grossly Respiratory system: Bilaterally clear BS,no use of accessory muscle Cardiovascular system: S1 & S2 +, No JVD. Gastrointestinal system: Abdomen soft,NT,ND, BS+ Nervous System: Alert, awake, moving all extremities,and following commands. Extremities: Lt AKA w/ occlusive dressing in place Skin: No rashes,no icterus. MSK: Normal muscle bulk,tone, power   Medications reviewed:  Scheduled Meds:  aspirin EC  81 mg Oral Daily   atorvastatin  20 mg Oral QHS   Chlorhexidine Gluconate Cloth  6 each Topical Q0600   DULoxetine  30 mg Oral Daily   enoxaparin (LOVENOX) injection  40 mg Subcutaneous Q24H   gabapentin  600 mg Oral TID   insulin aspart  0-15 Units Subcutaneous TID WC   insulin aspart  0-5 Units Subcutaneous QHS   insulin glargine-yfgn  15 Units Subcutaneous QHS   metoprolol succinate  50 mg Oral Daily   mupirocin ointment  1 Application Nasal BID   polyethylene glycol  17 g Oral Daily   traMADol  50 mg Oral Q6H   Continuous Infusions:  Diet Order             Diet heart healthy/carb modified Room service appropriate? No; Fluid consistency: Thin  Diet effective now                   Intake/Output Summary (Last 24 hours) at 12/15/2022 1143 Last data filed at 12/15/2022 1118 Gross per 24 hour  Intake 0 ml  Output 2960 ml  Net -2960 ml   Net IO Since Admission: -843.79 mL [12/15/22 1143]  Wt Readings from Last 3 Encounters:  12/14/22 94 kg   11/02/22 105.2 kg  10/29/22 96.2 kg     Unresulted Labs (From admission, onward)     Start     Ordered   12/18/22 0500  Creatinine, serum  (enoxaparin (LOVENOX)    CrCl >/= 30 ml/min)  Weekly,   R     Comments: while on enoxaparin therapy    12/11/22 1521   12/14/22 0500  Basic metabolic panel  Daily,   R      12/13/22 0904   12/14/22 0500  CBC  Daily,   R      12/13/22 0904          Data Reviewed: I have personally reviewed following labs and imaging studies CBC: Recent Labs  Lab 12/11/22 1934 12/12/22 0330 12/13/22 0925 12/14/22 0521 12/15/22 0535  WBC 11.0* 11.9* 12.4* 13.1* 11.6*  HGB 14.0 13.6 12.4* 12.5* 12.9*  HCT 43.2 42.5 39.0 39.5 40.3  MCV 95.8 96.4 97.7 98.0 96.0  PLT 375 346 309 294 304   Basic Metabolic Panel: Recent Labs  Lab 12/11/22 1105 12/11/22 1934 12/12/22 0330 12/13/22 0925 12/14/22 0521 12/15/22 0535  NA 137  --  137 132* 134* 134*  K 4.6  --  5.1 4.6 4.6 4.4  CL 98  --  100 101 106 104  CO2 27  --  24 23 21* 23  GLUCOSE 171*  --  194* 184* 152* 118*  BUN 15  --  21 26* 23 19  CREATININE 1.62* 1.37* 1.32* 1.22 1.29* 1.09  CALCIUM 9.0  --  8.7* 8.2* 7.8* 8.1*   GFR: Estimated Creatinine Clearance: 68.1 mL/min (by C-G formula based on SCr of 1.09 mg/dL). Liver Function Tests: Recent Labs  Lab 12/11/22 1105 12/12/22 0330  AST 21 16  ALT 22 19  ALKPHOS 99 91  BILITOT 1.0 0.8  PROT 7.4 6.7  ALBUMIN 3.1* 2.8*   No results for input(s): "LIPASE", "AMYLASE" in the last 168 hours. No results for input(s): "AMMONIA" in the last 168 hours. Coagulation Profile: Recent Labs  Lab 12/11/22 1105  INR 1.2   BNP (last 3 results) No results for input(s): "PROBNP" in the last 8760 hours. HbA1C: No results for input(s): "HGBA1C" in the last 72 hours.  CBG: Recent Labs  Lab 12/14/22 1135 12/14/22 1633 12/14/22 2120 12/15/22 0544 12/15/22 1116  GLUCAP 108* 117* 137* 119* 138*   Recent Results (from the past 240 hour(s))   Surgical pcr screen     Status: Abnormal   Collection Time: 12/11/22 11:11 AM   Specimen: Nasal Mucosa; Nasal Swab  Result Value Ref Range Status   MRSA, PCR POSITIVE (A) NEGATIVE Final    Comment: RESULT CALLED TO, READ BACK BY AND VERIFIED WITH: RN R RAPHINOVA [id number] AT 1314 BY CM    Staphylococcus aureus POSITIVE (A) NEGATIVE Final    Comment: (NOTE) The Xpert SA Assay (FDA approved for NASAL specimens in patients 53 years of age and older), is one component of a comprehensive surveillance program. It is not intended to diagnose infection nor to guide or monitor treatment. Performed at [facility], [address] [place], [place] [address]     Antimicrobials: Anti-infectives (From admission, onward)    Start     Dose/Rate Route Frequency Ordered Stop   12/11/22 1128  ceFAZolin (ANCEF) IVPB 2g/100 mL premix        2 g 200 mL/hr over 30 Minutes Intravenous 30 min pre-op 12/11/22 1043 12/11/22 1351      Culture/Microbiology    Component Value Date/Time   SDES BLOOD LEFT HAND 04/01/2017 2304   SPECREQUEST AEROBIC BOTTLE ONLY Blood Culture adequate volume 04/01/2017 2304   CULT  04/01/2017 2304    NO GROWTH 5 DAYS Performed at [facility], [address] [place], [place] [address]    REPTSTATUS 04/07/2017 FINAL 04/01/2017 2304  Radiology Studies: No results found.   LOS: 4 days   Lanae Boast, MD [facility]  12/15/2022, 11:43 AM

## 2022-12-16 DIAGNOSIS — Z89619 Acquired absence of unspecified leg above knee: Secondary | ICD-10-CM | POA: Diagnosis not present

## 2022-12-16 LAB — CBC
HCT: 41.5 % (ref 39.0–52.0)
Hemoglobin: 13.3 g/dL (ref 13.0–17.0)
MCH: 31 pg (ref 26.0–34.0)
MCHC: 32 g/dL (ref 30.0–36.0)
MCV: 96.7 fL (ref 80.0–100.0)
Platelets: 324 10*3/uL (ref 150–400)
RBC: 4.29 MIL/uL (ref 4.22–5.81)
RDW: 14 % (ref 11.5–15.5)
WBC: 9.8 10*3/uL (ref 4.0–10.5)
nRBC: 0 % (ref 0.0–0.2)

## 2022-12-16 LAB — BASIC METABOLIC PANEL
Anion gap: 8 (ref 5–15)
BUN: 20 mg/dL (ref 8–23)
CO2: 25 mmol/L (ref 22–32)
Calcium: 8.5 mg/dL — ABNORMAL LOW (ref 8.9–10.3)
Chloride: 104 mmol/L (ref 98–111)
Creatinine, Ser: 1.17 mg/dL (ref 0.61–1.24)
GFR, Estimated: >60 mL/min (ref >60)
Glucose, Bld: 105 mg/dL — ABNORMAL HIGH (ref 70–99)
Potassium: 4.8 mmol/L (ref 3.5–5.1)
Sodium: 137 mmol/L (ref 135–145)

## 2022-12-16 LAB — GLUCOSE, CAPILLARY
Glucose-Capillary: 106 mg/dL — ABNORMAL HIGH (ref 70–99)
Glucose-Capillary: 134 mg/dL — ABNORMAL HIGH (ref 70–99)
Glucose-Capillary: 116 mg/dL — ABNORMAL HIGH (ref 70–99)
Glucose-Capillary: 126 mg/dL — ABNORMAL HIGH (ref 70–99)

## 2022-12-16 MED ORDER — CHLORHEXIDINE GLUCONATE CLOTH 2 % EX PADS
6.0000 | MEDICATED_PAD | Freq: Every day | CUTANEOUS | Status: DC
  Administered 2022-12-16: 6 via TOPICAL

## 2022-12-16 MED ORDER — CLOPIDOGREL BISULFATE 75 MG PO TABS
75.0000 mg | ORAL_TABLET | Freq: Every day | ORAL | Status: DC
  Administered 2022-12-16 – 2022-12-18 (×3): 75 mg via ORAL
  Filled 2022-12-16 (×3): qty 1

## 2022-12-16 NOTE — Plan of Care (Signed)
  Problem: Elimination: Goal: Will not experience complications related to urinary retention Outcome: Progressing   Problem: Pain Management: Goal: General experience of comfort will improve Outcome: Progressing   Problem: Safety: Goal: Ability to remain free from injury will improve Outcome: Progressing

## 2022-12-16 NOTE — Progress Notes (Signed)
Orthopedic Tech Progress Note Patient Details:  HENOK DELOZIER 22-Mar-1950 [id number] Left stump sock has been ordered from [facility]  Patient ID: VICTORY FEICKERT, male   DOB: January 23, 1951, 72 y.o.   MRN: [id number]  Smitty Pluck 12/16/2022, 7:33 AM

## 2022-12-16 NOTE — TOC Progression Note (Addendum)
Transition of Care [facility]) - Progression Note    Patient Details  Name: James Dougherty MRN: [id number] Date of Birth: 07-09-1950  Transition of Care [facility]) CM/SW Contact  Carley Hammed, LCSW Phone Number: 12/16/2022, 11:25 AM  Clinical Narrative:    CSW was informed pt is medically stable to DC back to [facility]. Authorization pending at this time. Facility able to accept pt as soon as Berkley Harvey has been approved. TOC will continue to follow.   2:00 CSW spoke with pt's dtr who stated that she believes there was a miscommunication with planning. Per dtr, Dr. Sherral Hammers wants pt to go to [facility]. In lieu of 24/7 care, they would like pt to return to [facility] as a LTC pt until he is able to get a prosthetic. Per dtr they are working with [facility] to get this arranged. CSW confirmed with [facility] they are not going to move forward with admission at this time. CSW updated dtr and she is agreeable to SNF at [facility]. Berkley Harvey continues to pend. TOC will continue to follow.  Expected Discharge Plan: Skilled Nursing Facility Barriers to Discharge: Continued Medical Work up  Expected Discharge Plan and Services In-house Referral: Clinical Social Work     Living arrangements for the past 2 months: Skilled Nursing Facility                                       Social Determinants of Health (SDOH) Interventions SDOH Screenings   Food Insecurity: No Food Insecurity (12/15/2022)  Housing: Low Risk  (12/15/2022)  Transportation Needs: No Transportation Needs (12/15/2022)  Utilities: Not At Risk (12/15/2022)  Depression (PHQ2-9): Low Risk  (12/03/2021)  Financial Resource Strain: Patient Declined (10/20/2022)   Received from [facility]  Physical Activity: Inactive (10/20/2022)   Received from [facility]  Social Connections: Patient Declined (10/20/2022)   Received from [facility]  Stress: No Stress Concern Present (10/20/2022)   Received from [facility]  Tobacco Use: Medium  Risk (12/11/2022)  Health Literacy: Medium Risk (11/02/2022)   Received from [facility]    Readmission Risk Interventions     No data to display

## 2022-12-16 NOTE — Plan of Care (Signed)
  Problem: Pain Management: Goal: General experience of comfort will improve Outcome: Progressing   Problem: Safety: Goal: Ability to remain free from injury will improve Outcome: Progressing

## 2022-12-16 NOTE — Progress Notes (Signed)
Foley catheter removed , 750 ml output noted in bag.

## 2022-12-16 NOTE — Progress Notes (Signed)
  Progress Note    12/16/2022 6:53 AM 5 Days Post-Op  Subjective:  no complaints; pain only if they move him.  Afebrile  Vitals:   12/16/22 0011 12/16/22 0335  BP: (!) 127/58 (!) 131/59  Pulse: (!) 55 (!) 55  Resp: 18 18  Temp: 97.6 F (36.4 C) 97.6 F (36.4 C)  SpO2: 96% 90%    Physical Exam: Incisions:  clean     CBC    Component Value Date/Time   WBC 11.6 (H) 12/15/2022 0535   RBC 4.20 (L) 12/15/2022 0535   HGB 12.9 (L) 12/15/2022 0535   HCT 40.3 12/15/2022 0535   PLT 304 12/15/2022 0535   MCV 96.0 12/15/2022 0535   MCH 30.7 12/15/2022 0535   MCHC 32.0 12/15/2022 0535   RDW 13.9 12/15/2022 0535   LYMPHSABS 1.5 11/05/2021 0743   MONOABS 0.8 11/05/2021 0743   EOSABS 0.5 11/05/2021 0743   BASOSABS 0.0 11/05/2021 0743    BMET    Component Value Date/Time   NA 134 (L) 12/15/2022 0535   K 4.4 12/15/2022 0535   CL 104 12/15/2022 0535   CO2 23 12/15/2022 0535   GLUCOSE 118 (H) 12/15/2022 0535   BUN 19 12/15/2022 0535   CREATININE 1.09 12/15/2022 0535   CALCIUM 8.1 (L) 12/15/2022 0535   GFRNONAA >60 12/15/2022 0535   GFRAA 47 (L) 04/28/2017 0238    INR    Component Value Date/Time   INR 1.2 12/11/2022 1105     Intake/Output Summary (Last 24 hours) at 12/16/2022 0653 Last data filed at 12/15/2022 1956 Gross per 24 hour  Intake 240 ml  Output 800 ml  Net -560 ml     Assessment/Plan:  72 y.o. male is s/p left above knee amputation   5 Days Post-Op   -left AKA stump looks good.   -tried to re-wrap with ace, but he did not tolerate this.  I have ordered a stump sock. -pt will f/u in 4-5 weeks for staple removal.    Doreatha Massed, PA-C [facility] [phone] 12/16/2022 6:53 AM

## 2022-12-16 NOTE — Evaluation (Signed)
Occupational Therapy Evaluation Patient Details Name: James Dougherty MRN: [id number] DOB: [birth year] Today's Date: 12/16/2022   History of Present Illness 72 y.o. admitted 11/15 with nonhealing Lt 1st ray amputation s/p Lt AKA same date. PMhx: HTN, HLD, neuropathy, PAD, psoriasis, CVA, T2DM.   Clinical Impression   Patient is s/p L AKA surgery resulting in functional limitations due to the deficits listed below (see OT Problem List). Pt is a short term rehab patient at SNF and reports that he has been there for the past 3 weeks. Prior to L foot toe amputation, pt states he was independent-mod I for BADL tasks. Pt is currently experiencing sever pain in L residual limb with any touch or movement. Hypersensitivity to touch is present. Post retention sock was donned during session. Pt was educated on the purpose and donning technique, education provided on pain management techniques, techniques to decrease hypersensitivity to touch. Pt verbalized understanding. Recommend pt return to SNF when discharged to continue working with skilled OT services to increase functional performance during ADL tasks and progress mobility.  Patient will benefit from acute skilled OT to increase their safety and independence with ADL and functional mobility for ADL to allow facilitate discharge. OT will continue to work with patient acutely.         If plan is discharge home, recommend the following: Two people to help with walking and/or transfers;Two people to help with bathing/dressing/bathroom    Functional Status Assessment  Patient has had a recent decline in their functional status and demonstrates the ability to make significant improvements in function in a reasonable and predictable amount of time.  Equipment Recommendations  Other (comment) (defer to next venue of care)       Precautions / Restrictions Precautions Precautions: Fall Precaution Comments: HOH, L AKA, rectal tube Restrictions Weight  Bearing Restrictions: Yes LLE Weight Bearing: Non weight bearing      Mobility Bed Mobility Overal bed mobility: Needs Assistance Bed Mobility: Rolling Rolling: Max assist, +2 for physical assistance, Used rails         General bed mobility comments: Pt required VC to initiate and visual cues to use rail with both hands to pull self onto his side. Increased pain felt with both directions although rolling L more painful than R. Bed pad used to assist, pt with rolling.    Transfers  General transfer comment: Not assessed due to safety concerns and pain.      Balance Overall balance assessment:  (Unable to assess sitting balance unsupported during session. See PT eval for overall balance assessment)       ADL either performed or assessed with clinical judgement   ADL     General ADL Comments: Pt requires total A x2 for LB dressing and bathing at bed level. Min A for UB bathing and dressing at bed level, set up for grooming at bed level, and set-up for self-feeding at bed level. Post op retention sock had been delivered in pt's room. Pt was educated on use. Total A for donning sock with increased time needed due to pain and hypersensitivity.     Vision Baseline Vision/History: 1 Wears glasses Ability to See in Adequate Light: 0 Adequate Patient Visual Report: No change from baseline Vision Assessment?: No apparent visual deficits     Perception Perception: Not tested       Praxis Praxis: Not tested       Pertinent Vitals/Pain Pain Assessment Pain Assessment: Faces Faces Pain Scale: Hurts worst Pain Location:  L residual limb during bed mobility or when touched Pain Descriptors / Indicators: Grimacing, Guarding Pain Intervention(s): Limited activity within patient's tolerance, Monitored during session, Repositioned, Other (comment) (instructed on breathing techniques)     Extremity/Trunk Assessment Upper Extremity Assessment Upper Extremity Assessment: Generalized  weakness   Lower Extremity Assessment Lower Extremity Assessment: Defer to PT evaluation;Generalized weakness LLE Deficits / Details: AKA   Cervical / Trunk Assessment Cervical / Trunk Assessment:  (Unable to assess during session)   Communication Communication Communication: Hearing impairment Cueing Techniques: Verbal cues;Visual cues;Tactile cues   Cognition Arousal: Alert Behavior During Therapy: Anxious, Flat affect Overall Cognitive Status: No family/caregiver present to determine baseline cognitive functioning      General Comments: Pt was oriented x3. Verbalized understanding regarding post op AKA education provided regarding pain expectations, use of post retention sock, breathing techniques for pain, and recommendation to work on hip extension while in bed.     General Comments  Gauze wrap falling off of L residual limb. Per MD note, ace wrap was not tolerated on earlier today. OT replaced gauze wrap prior to donning post op retention sock. Did not attempt ace wrap prior to sock due to increased sensitivity. Pain level increased with any light touch. pt provided verbal education on desensitization techniques that he may perform on his own in bed.            Home Living Family/patient expects to be discharged to:: Skilled nursing facility    Additional Comments: Pt reports that he was at a SNF for 3 weeks prior to admit for L AKA focusing on transfers, ambulation, and strength after his toe was amputated on his left foot.      Prior Functioning/Environment Prior Level of Function : Needs assist;Independent/Modified Independent    Mobility Comments: Pt reports that he was using a RW at home prior to going to SNF for rehab. ADLs Comments: At home, before SNF, pt reports that he was independent with all ADL tasks and lived alone. At SNF, pt requires assist from staff to complete BADL tasks.        OT Problem List: Decreased strength;Decreased safety awareness;Decreased  knowledge of use of DME or AE;Decreased activity tolerance;Impaired balance (sitting and/or standing);Decreased knowledge of precautions;Pain      OT Treatment/Interventions: Self-care/ADL training;Therapeutic activities;Therapeutic exercise;Patient/family education;Energy conservation;DME and/or AE instruction;Balance training;Manual therapy;Modalities    OT Goals(Current goals can be found in the care plan section) Acute Rehab OT Goals Patient Stated Goal: None stated OT Goal Formulation: Patient unable to participate in goal setting Time For Goal Achievement: 12/30/22 Potential to Achieve Goals: Fair  OT Frequency: Min 1X/week       AM-PAC OT "6 Clicks" Daily Activity     Outcome Measure Help from another person eating meals?: None Help from another person taking care of personal grooming?: A Little Help from another person toileting, which includes using toliet, bedpan, or urinal?: Total Help from another person bathing (including washing, rinsing, drying)?: A Lot Help from another person to put on and taking off regular upper body clothing?: A Lot Help from another person to put on and taking off regular lower body clothing?: Total 6 Click Score: 13   End of Session    Activity Tolerance: Patient limited by pain Patient left: in bed;with call bell/phone within reach;with bed alarm set  OT Visit Diagnosis: Muscle weakness (generalized) (M62.81);Unsteadiness on feet (R26.81);Pain Pain - Right/Left: Left Pain - part of body: Leg  Time: 1096-0454 OT Time Calculation (min): 40 min Charges:  OT General Charges $OT Visit: 1 Visit OT Treatments $Self Care/Home Management : 8-22 mins $Therapeutic Activity: 23-37 mins  Limmie Patricia, OTR/L,CBIS  Supplemental OT - [facility] and [facility] Secure Chat Preferred    Kristi Hyer, Charisse March 12/16/2022, 4:14 PM

## 2022-12-16 NOTE — Discharge Instructions (Signed)
Cleanse left above knee amputation with soap and water daily.  Pat dry and apply dressing.

## 2022-12-16 NOTE — Progress Notes (Signed)
PROGRESS NOTE ABB TWIGG  ZOX:[id number] DOB: 17-Mar-1950 DOA: 12/11/2022 PCP: [name], MD  Brief Narrative/Hospital Course: 72 yom w/ chronic BLE wounds with lymphedema, T2DM with neuropathy, PAD, HLD,critical right ICA stenosis. But asymptomatic brought into the hospital for elective AKA due to nonhealing left lower extremity wounds and hospitalist service was requested for the admission  post op.He had underwent DCB SFA, lithotripsy popliteal artery, PTX tibioperoneal trunk, DCB right external iliac artery by Dr. 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  He is s/p left AKA 11/15.he has  from that standpoint.  He just underwent AKA.  Patient underwent takedown of the dressing on 11/20 stump looks good, stump sock has been ordered and planning for follow-up in 4 to 5 weeks for staple removal.  He remains medically stable.    Subjective: Patient seen and examined this morning complains of pain at the stump site,  no new complaints   Assessment and Plan: Principal Problem:   S/P AKA (above knee amputation) unilateral (HCC) Active Problems:   PVD (peripheral vascular disease) (HCC)   PAD (peripheral artery disease) (HCC)   Type II diabetes mellitus with complication (HCC)   Dry gangrene (HCC)   Multiple open wounds of left lower extremity   S/P AKA (above knee amputation) unilateral, left (HCC)   PAD Nonhealing multiple wounds of left lower extremity S/p Left AKA 11/15: Asa started 11/18. Continue atorvastatin.  Continue pain control with oral/IV narcotics and opiates. Dressing removed this morning 921.   Cont pain management avoid IV   Chronic combined systolic and diastolic congestive heart failure: Last TTE May/2023-LVEF 30-40% , g3dd, PTA on Lasix.Holding Lasix in postop state,  once oral intake okay we will resume soon, monitor intake output Daily weight.  Monitor renal functions as well   Right ICA stenosis: Known issue, asymptomatic. Asa resume 11/18 and okay to resume Plavix 11/20 per vascular.    HLD: Continue statin  Constipation: Cont PR Dulcolax, started MiraLAX daily 11/18. Last BM 11/12.  T2DM W/ Peripheral neuropathy: PTA on levemir at bedtime and Trulicity and Jardiance. Started on 15 u Semglee,SSI and home gabapentin. Continue the same. A1c at 8.4 Recent Labs  Lab 12/11/22 1934 12/11/22 2059 12/15/22 0544 12/15/22 1116 12/15/22 1835 12/15/22 1957 12/16/22 0842  GLUCAP  --    < > 119* 138* 116* 115* 106*  HGBA1C 8.4*  --   --   --   --   --   --    < > = values in this interval not displayed.     Essential hypertension: Stable on Toprol  CKD3b: B/l creat abt the same. Holding 1.2-1.3.-Continue to monitor.   Urine retention: needed In-N-Out cath 11/16.  And subsequently Foley placed monitor. VOIDING TRAIL in few days vs cont Foley at SNF for 1 wk  Obesity:Patient's Body mass index is 31.58 kg/m. : Will benefit with PCP follow-up, weight loss  healthy lifestyle and outpatient sleep evaluation. Pressure injury POA coccyx stage II continue wound care Pressure Injury 12/11/22 Coccyx Stage 2 -  Partial thickness loss of dermis presenting as a shallow open injury with a red, pink wound bed without slough. (Active)  12/11/22 1915  Location: Coccyx  Location Orientation:   Staging: Stage 2 -  Partial thickness loss of dermis presenting as  a shallow open injury with a red, pink wound bed without slough.  Wound Description (Comments):   Present on Admission: Yes  Dressing Type Foam - Lift dressing to assess site every shift 12/12/22 0900   DVT prophylaxis: enoxaparin (LOVENOX) injection 40 mg Start: 12/12/22 1500 Code Status:   Code Status: Full Code Family Communication: plan of care discussed with patient at bedside. Patient status is:  Inpatient because of postop Level of care: Med-Surg   Dispo: The patient is from: SNF            Anticipated disposition: Back to  SNF  once approval obtained and VVS ok  Objective: Vitals last 24 hrs: Vitals:   12/16/22 0011 12/16/22 0335 12/16/22 0337 12/16/22 0841  BP: (!) 127/58 (!) 131/59  131/75  Pulse: (!) 55 (!) 55  (!) 55  Resp: 18 18  18   Temp: 97.6 F (36.4 C) 97.6 F (36.4 C)  (!) 97.5 F (36.4 C)  TempSrc: Oral Oral    SpO2: 96% 90%  94%  Weight:   94.2 kg   Height:       Weight change:   Physical Examination: General exam: alert awake, pleasant HEENT:Oral mucosa moist, Ear/Nose WNL grossly Respiratory system: Bilaterally clear BS,no use of accessory muscle Cardiovascular system: S1 & S2 +, No JVD. Gastrointestinal system: Abdomen soft,NT,ND, BS+ Nervous System: Alert, awake, moving all extremities,and following commands. Extremities: LE edema neg lt AKA stump staples c/d/I Skin: No rashes,no icterus. MSK: Normal muscle bulk,tone, power    Medications reviewed:  Scheduled Meds:  aspirin EC  81 mg Oral Daily   atorvastatin  20 mg Oral QHS   Chlorhexidine Gluconate Cloth  6 each Topical Daily   DULoxetine  30 mg Oral Daily   enoxaparin (LOVENOX) injection  40 mg Subcutaneous Q24H   gabapentin  600 mg Oral TID   insulin aspart  0-15 Units Subcutaneous TID WC   insulin aspart  0-5 Units Subcutaneous QHS   insulin glargine-yfgn  15 Units Subcutaneous QHS   metoprolol succinate  50 mg Oral Daily   mupirocin ointment  1 Application Nasal BID   polyethylene glycol  17 g Oral Daily   traMADol  50 mg Oral Q6H   Continuous Infusions:  Diet Order             Diet heart healthy/carb modified Room service appropriate? No; Fluid consistency: Thin  Diet effective now                   Intake/Output Summary (Last 24 hours) at 12/16/2022 1139 Last data filed at 12/16/2022 1100 Gross per 24 hour  Intake 580 ml  Output 2070 ml  Net -1490 ml   Net IO  Since Admission: -2,333.79 mL [12/16/22 1139]  Wt Readings from Last 3 Encounters:  12/16/22 94.2 kg  11/02/22 105.2 kg  10/29/22 96.2 kg     Unresulted Labs (From admission, onward)     Start     Ordered   12/18/22 0500  Creatinine, serum  (enoxaparin (LOVENOX)    CrCl >/= 30 ml/min)  Weekly,   R     Comments: while on enoxaparin therapy    12/11/22 1521   12/14/22 0500  Basic metabolic panel  Daily,   R      12/13/22 0904   12/14/22 0500  CBC  Daily,   R      12/13/22 1610          Data Reviewed: I have  personally reviewed following labs and imaging studies CBC: Recent Labs  Lab 12/12/22 0330 12/13/22 0925 12/14/22 0521 12/15/22 0535 12/16/22 0749  WBC 11.9* 12.4* 13.1* 11.6* 9.8  HGB 13.6 12.4* 12.5* 12.9* 13.3  HCT 42.5 39.0 39.5 40.3 41.5  MCV 96.4 97.7 98.0 96.0 96.7  PLT 346 309 294 304 324   Basic Metabolic Panel: Recent Labs  Lab 12/12/22 0330 12/13/22 0925 12/14/22 0521 12/15/22 0535 12/16/22 0749  NA 137 132* 134* 134* 137  K 5.1 4.6 4.6 4.4 4.8  CL 100 101 106 104 104  CO2 24 23 21* 23 25  GLUCOSE 194* 184* 152* 118* 105*  BUN 21 26* 23 19 20   CREATININE 1.32* 1.22 1.29* 1.09 1.17  CALCIUM 8.7* 8.2* 7.8* 8.1* 8.5*  GFR: Estimated Creatinine Clearance: 63.5 mL/min (by C-G formula based on SCr of 1.17 mg/dL). Liver Function Tests: Recent Labs  Lab 12/11/22 1105 12/12/22 0330  AST 21 16  ALT 22 19  ALKPHOS 99 91  BILITOT 1.0 0.8  PROT 7.4 6.7  ALBUMIN 3.1* 2.8*  No results for input(s): "LIPASE", "AMYLASE" in the last 168 hours. No results for input(s): "AMMONIA" in the last 168 hours. Coagulation Profile: Recent Labs  Lab 12/11/22 1105  INR 1.2  CBG: Recent Labs  Lab 12/15/22 0544 12/15/22 1116 12/15/22 1835 12/15/22 1957 12/16/22 0842  GLUCAP 119* 138* 116* 115* 106*   Recent Results (from the past 240 hour(s))  Surgical pcr screen     Status: Abnormal   Collection Time: 12/11/22 11:11 AM   Specimen: Nasal Mucosa; Nasal  Swab  Result Value Ref Range Status   MRSA, PCR POSITIVE (A) NEGATIVE Final    Comment: RESULT CALLED TO, READ BACK BY AND VERIFIED WITH: RN R RAPHINOVA [id number] AT 1314 BY CM    Staphylococcus aureus POSITIVE (A) NEGATIVE Final    Comment: (NOTE) The Xpert SA Assay (FDA approved for NASAL specimens in patients 75 years of age and older), is one component of a comprehensive surveillance program. It is not intended to diagnose infection nor to guide or monitor treatment. Performed at [facility], [address] [place], [place] [address]     Antimicrobials: Anti-infectives (From admission, onward)    Start     Dose/Rate Route Frequency Ordered Stop   12/11/22 1128  ceFAZolin (ANCEF) IVPB 2g/100 mL premix        2 g 200 mL/hr over 30 Minutes Intravenous 30 min pre-op 12/11/22 1043 12/11/22 1351      Culture/Microbiology    Component Value Date/Time   SDES BLOOD LEFT HAND 04/01/2017 2304   SPECREQUEST AEROBIC BOTTLE ONLY Blood Culture adequate volume 04/01/2017 2304   CULT  04/01/2017 2304    NO GROWTH 5 DAYS Performed at [facility], [address], [place], [place] [address]    REPTSTATUS 04/07/2017 FINAL 04/01/2017 2304  Radiology Studies: No results found.  LOS: 5 days  Lanae Boast, MD [facility]  12/16/2022, 11:39 AM

## 2022-12-16 NOTE — Progress Notes (Addendum)
  Progress Note    12/16/2022 1:54 PM 5 Days Post-Op  Subjective:  no complaints; pain only if they move him.  Afebrile  Vitals:   12/16/22 0335 12/16/22 0841  BP: (!) 131/59 131/75  Pulse: (!) 55 (!) 55  Resp: 18 18  Temp: 97.6 F (36.4 C) (!) 97.5 F (36.4 C)  SpO2: 90% 94%    Physical Exam: Incisions:  clean     CBC    Component Value Date/Time   WBC 9.8 12/16/2022 0749   RBC 4.29 12/16/2022 0749   HGB 13.3 12/16/2022 0749   HCT 41.5 12/16/2022 0749   PLT 324 12/16/2022 0749   MCV 96.7 12/16/2022 0749   MCH 31.0 12/16/2022 0749   MCHC 32.0 12/16/2022 0749   RDW 14.0 12/16/2022 0749   LYMPHSABS 1.5 11/05/2021 0743   MONOABS 0.8 11/05/2021 0743   EOSABS 0.5 11/05/2021 0743   BASOSABS 0.0 11/05/2021 0743    BMET    Component Value Date/Time   NA 137 12/16/2022 0749   K 4.8 12/16/2022 0749   CL 104 12/16/2022 0749   CO2 25 12/16/2022 0749   GLUCOSE 105 (H) 12/16/2022 0749   BUN 20 12/16/2022 0749   CREATININE 1.17 12/16/2022 0749   CALCIUM 8.5 (L) 12/16/2022 0749   GFRNONAA >60 12/16/2022 0749   GFRAA 47 (L) 04/28/2017 0238    INR    Component Value Date/Time   INR 1.2 12/11/2022 1105     Intake/Output Summary (Last 24 hours) at 12/16/2022 1354 Last data filed at 12/16/2022 1100 Gross per 24 hour  Intake 580 ml  Output 2070 ml  Net -1490 ml     Assessment/Plan:  72 y.o. male is s/p left above knee amputation   5 Days Post-Op   -left AKA stump looks good.   -tried to re-wrap with ace, but he did not tolerate this.  I have ordered a stump sock. -pt will f/u in 4-5 weeks for staple removal.    Doreatha Massed, PA-C [facility] [phone] 12/16/2022 1:54 PM   VASCULAR STAFF ADDENDUM: I have independently interviewed and examined the patient. I agree with the above.  States he feels constipated.  Daughter worried he will get poor rehab at his SNF [facility]. Incision CDI. Skin opposed.  Will follow  up with me in 4 weeks.   Victorino Sparrow MD [facility] Phone Number: [phone] 12/16/2022 1:54 PM

## 2022-12-17 ENCOUNTER — Inpatient Hospital Stay (HOSPITAL_COMMUNITY): Payer: Medicare Other

## 2022-12-17 DIAGNOSIS — Z89619 Acquired absence of unspecified leg above knee: Secondary | ICD-10-CM | POA: Diagnosis not present

## 2022-12-17 LAB — CBC
HCT: 42.2 % (ref 39.0–52.0)
Hemoglobin: 13.6 g/dL (ref 13.0–17.0)
MCH: 30.6 pg (ref 26.0–34.0)
MCHC: 32.2 g/dL (ref 30.0–36.0)
MCV: 95 fL (ref 80.0–100.0)
Platelets: 294 10*3/uL (ref 150–400)
RBC: 4.44 MIL/uL (ref 4.22–5.81)
RDW: 14.1 % (ref 11.5–15.5)
WBC: 8.1 10*3/uL (ref 4.0–10.5)
nRBC: 0 % (ref 0.0–0.2)

## 2022-12-17 LAB — BASIC METABOLIC PANEL
Anion gap: 12 (ref 5–15)
BUN: 18 mg/dL (ref 8–23)
CO2: 22 mmol/L (ref 22–32)
Calcium: 8.4 mg/dL — ABNORMAL LOW (ref 8.9–10.3)
Chloride: 104 mmol/L (ref 98–111)
Creatinine, Ser: 1.38 mg/dL — ABNORMAL HIGH (ref 0.61–1.24)
GFR, Estimated: 54 mL/min — ABNORMAL LOW (ref >60)
Glucose, Bld: 168 mg/dL — ABNORMAL HIGH (ref 70–99)
Potassium: 4.8 mmol/L (ref 3.5–5.1)
Sodium: 138 mmol/L (ref 135–145)

## 2022-12-17 LAB — GLUCOSE, CAPILLARY
Glucose-Capillary: 191 mg/dL — ABNORMAL HIGH (ref 70–99)
Glucose-Capillary: 116 mg/dL — ABNORMAL HIGH (ref 70–99)
Glucose-Capillary: 102 mg/dL — ABNORMAL HIGH (ref 70–99)
Glucose-Capillary: 137 mg/dL — ABNORMAL HIGH (ref 70–99)

## 2022-12-17 MED ORDER — INSULIN GLARGINE-YFGN 100 UNIT/ML ~~LOC~~ SOLN
21.0000 [IU] | Freq: Every day | SUBCUTANEOUS | Status: DC
  Administered 2022-12-17: 21 [IU] via SUBCUTANEOUS
  Filled 2022-12-17 (×2): qty 0.21

## 2022-12-17 MED ORDER — MAGNESIUM HYDROXIDE 400 MG/5ML PO SUSP
30.0000 mL | Freq: Two times a day (BID) | ORAL | Status: AC
  Administered 2022-12-17 – 2022-12-18 (×2): 30 mL via ORAL
  Filled 2022-12-17 (×3): qty 30

## 2022-12-17 MED ORDER — SODIUM CHLORIDE 0.9 % IV SOLN: INTRAVENOUS | Status: DC

## 2022-12-17 MED ORDER — BISACODYL 5 MG PO TBEC
10.0000 mg | DELAYED_RELEASE_TABLET | Freq: Once | ORAL | Status: AC
  Administered 2022-12-17: 10 mg via ORAL
  Filled 2022-12-17: qty 2

## 2022-12-17 MED ORDER — OXYCODONE HCL 5 MG PO TABS
5.0000 mg | ORAL_TABLET | Freq: Four times a day (QID) | ORAL | Status: DC | PRN
  Administered 2022-12-17: 5 mg via ORAL
  Filled 2022-12-17: qty 1

## 2022-12-17 MED ORDER — ACETAMINOPHEN 500 MG PO TABS
1000.0000 mg | ORAL_TABLET | Freq: Three times a day (TID) | ORAL | Status: DC
  Administered 2022-12-17 – 2022-12-18 (×4): 1000 mg via ORAL
  Filled 2022-12-17 (×4): qty 2

## 2022-12-17 MED ORDER — OXYCODONE HCL 5 MG PO TABS
10.0000 mg | ORAL_TABLET | Freq: Four times a day (QID) | ORAL | Status: DC | PRN
  Administered 2022-12-17 – 2022-12-18 (×2): 10 mg via ORAL
  Filled 2022-12-17 (×2): qty 2

## 2022-12-17 NOTE — Progress Notes (Signed)
  Progress Note    12/17/2022 10:10 AM 6 Days Post-Op  Subjective:  no complaints, smiling  Afebrile  Vitals:   12/17/22 0405 12/17/22 0845  BP: 115/60 120/62  Pulse: (!) 56 (!) 56  Resp: 18   Temp: 97.8 F (36.6 C) 97.6 F (36.4 C)  SpO2: 99% 98%    Physical Exam: Incisions:  clean  Nonlabored breathing   CBC    Component Value Date/Time   WBC 8.1 12/17/2022 0756   RBC 4.44 12/17/2022 0756   HGB 13.6 12/17/2022 0756   HCT 42.2 12/17/2022 0756   PLT 294 12/17/2022 0756   MCV 95.0 12/17/2022 0756   MCH 30.6 12/17/2022 0756   MCHC 32.2 12/17/2022 0756   RDW 14.1 12/17/2022 0756   LYMPHSABS 1.5 11/05/2021 0743   MONOABS 0.8 11/05/2021 0743   EOSABS 0.5 11/05/2021 0743   BASOSABS 0.0 11/05/2021 0743    BMET    Component Value Date/Time   NA 138 12/17/2022 0756   K 4.8 12/17/2022 0756   CL 104 12/17/2022 0756   CO2 22 12/17/2022 0756   GLUCOSE 168 (H) 12/17/2022 0756   BUN 18 12/17/2022 0756   CREATININE 1.38 (H) 12/17/2022 0756   CALCIUM 8.4 (L) 12/17/2022 0756   GFRNONAA 54 (L) 12/17/2022 0756   GFRAA 47 (L) 04/28/2017 0238    INR    Component Value Date/Time   INR 1.2 12/11/2022 1105     Intake/Output Summary (Last 24 hours) at 12/17/2022 1010 Last data filed at 12/17/2022 0915 Gross per 24 hour  Intake 480 ml  Output 2080 ml  Net -1600 ml     Assessment/Plan:  72 y.o. male is s/p left above knee amputation   6 Days Post-Op   -left AKA stump looks good.   -stump sock in place   Victorino Sparrow MD [facility] [phone] 12/17/2022 10:10 AM

## 2022-12-17 NOTE — TOC Progression Note (Signed)
Transition of Care [facility]) - Progression Note    Patient Details  Name: James Dougherty MRN: [id number] Date of Birth: [birth year]  Transition [facility] Care [facility]) CM/SW Contact  Carley Hammed, LCSW Phone Number: 12/17/2022, 12:34 PM  Clinical Narrative:    Pt's authorization approved this morning, facility noting they can take pt when ready. Medical team noting DC will be held for today, plan for tomorrow. VM left for dtr. TOC will continue to follow for DC needs.    Expected Discharge Plan: Skilled Nursing Facility Barriers to Discharge: Continued Medical Work up  Expected Discharge Plan and Services In-house Referral: Clinical Social Work     Living arrangements for the past 2 months: Skilled Nursing Facility                                       Social Determinants of Health (SDOH) Interventions SDOH Screenings   Food Insecurity: No Food Insecurity (12/15/2022)  Housing: Low Risk  (12/15/2022)  Transportation Needs: No Transportation Needs (12/15/2022)  Utilities: Not At Risk (12/15/2022)  Depression (PHQ2-9): Low Risk  (12/03/2021)  Financial Resource Strain: Patient Declined (10/20/2022)   Received from [facility]  Physical Activity: Inactive (10/20/2022)   Received from [facility]  Social Connections: Patient Declined (10/20/2022)   Received from [facility]  Stress: No Stress Concern Present (10/20/2022)   Received from [facility]  Tobacco Use: Medium Risk (12/11/2022)  Health Literacy: Medium Risk (11/02/2022)   Received from [facility]    Readmission Risk Interventions     No data to display

## 2022-12-17 NOTE — Progress Notes (Signed)
PROGRESS NOTE  James Dougherty  DOB: 12-26-1950  PCP: [name], MD ZOX:[id number]  DOA: 12/11/2022  LOS: 6 days  Hospital Day: 7  Brief narrative: James Dougherty is a 72 y.o. male with PMH significant for obesity, DM2, HTN, HLD, stroke, CKD, neuropathy, PAD, lymphedema, critical right ICA stenosis. 11/15, patient was brought to the hospital for elective AKA for a nonhealing left lower extremity wound.  In September, he had revascularization attempt by Dr. Sherral Hammers but unfortunately he developed necrotic wounds on the foot and ultimately required left AKA on 11/15. Postop, patient was seen by [facility] for medical management and admitted to hospital. See below for details  Subjective: Patient was seen and examined this morning. Pleasant elderly Caucasian male.  Lying on bed.  Not in distress Nurses of trying to give him enema but there is no success. Chart reviewed.  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   Pain control with scheduled Tylenol, as needed oxycodone.  Avoid IV Dilaudid Followed by vascular surgery while in the hospital.  Per vascular surgery note from 11/20, left AKA stump looks good.  Patient did not tolerate rewrap attempt with ace wrap..  Stump sock has been ordered.   Patient to follow-up with vascular surgery in 4 to 5 weeks.  Removal.     Chronic combined systolic and diastolic congestive heart failure Hypertension Last TTE May/2023-LVEF 30-40% , g3dd PTA meds- Toprol 50 mg daily, Lasix 20 mg twice daily, Jardiance 10 mg daily Currently continued on Toprol 50 mg daily.  Lasix and Jardiance on hold.   H/o stroke  right ICA stenosis HLD Continue aspirin, Plavix and statin  Type 2 diabetes mellitus  uncontrolled A1c 8.4 on 12/11/2022 PTA meds-Trulicity weekly, Levemir at bedtime, Jardiance 10 mg daily Currently on Semglee 15 units nightly, SSI Increase Semglee to 21 units which is his previous requirement..  Continue SSI/Accu-Cheks Continue gabapentin for neuropathy Recent Labs  Lab 12/16/22 1155 12/16/22 1535 12/16/22 1948 12/17/22 0852 12/17/22 1152  GLUCAP 134* 116* 126* 191* 116*   CKD 3B Creatinine slightly elevated today. Stop tramadol.  Start normal saline at 75 mL/h. Recent Labs    09/30/22 0745 12/11/22 1105 12/11/22 1934 12/12/22 0330 12/13/22 0925 12/14/22 0521 12/15/22 0535 12/16/22 0749 12/17/22 0756  BUN 17 15  --  21 26* 23 19 20 18   CREATININE 1.60* 1.62* 1.37* 1.32* 1.22 1.29* 1.09 1.17 1.38*   Constipation: Despite daily MiraLAX, patient has not had a bowel movement in the last 9 days.  Last BM 11/12. Given 1 enema today without response.   Obtain KUB to rule out bowel obstruction.  Ordered Dulcolax oral 1 dose.    Urine retention: Required Foley catheter on 11/16 Pased voiding trial yesterday 11/20.   Obesity  Body mass index is 30.24 kg/m. Patient has been advised to make an attempt to improve diet and exercise patterns to aid in weight loss.  Pressure injury POA coccyx stage II  continue wound care   Mobility: PT eval obtained.  SNF recommended  Goals of care   Code Status: Full Code    DVT prophylaxis:  enoxaparin (LOVENOX) injection 40 mg Start: 12/12/22 1500   Antimicrobials: None currently Fluid: NS at 75 mL/h Consultants: Vascular surgery Family  Communication: None at bedside  Status: Inpatient Level of care:  Med-Surg   Patient is from: SNF Needs to continue in-hospital care: Constipated for last 1 week, creatinine elevated Anticipated d/c to: Back to SNF hopefully tomorrow   Diet:  Diet Order             Diet heart healthy/carb modified Room service appropriate? No; Fluid consistency: Thin  Diet effective now                    Scheduled Meds:  acetaminophen  1,000 mg Oral TID   aspirin EC  81 mg Oral Daily   atorvastatin  20 mg Oral QHS   bisacodyl  10 mg Oral Once   Chlorhexidine Gluconate Cloth  6 each Topical Daily   clopidogrel  75 mg Oral Daily   DULoxetine  30 mg Oral Daily   enoxaparin (LOVENOX) injection  40 mg Subcutaneous Q24H   gabapentin  600 mg Oral TID   insulin aspart  0-15 Units Subcutaneous TID WC   insulin aspart  0-5 Units Subcutaneous QHS   insulin glargine-yfgn  21 Units Subcutaneous QHS   metoprolol succinate  50 mg Oral Daily   polyethylene glycol  17 g Oral Daily    PRN meds: oxyCODONE   Infusions:   sodium chloride      Antimicrobials: Anti-infectives (From admission, onward)    Start     Dose/Rate Route Frequency Ordered Stop   12/11/22 1128  ceFAZolin (ANCEF) IVPB 2g/100 mL premix        2 g 200 mL/hr over 30 Minutes Intravenous 30 min pre-op 12/11/22 1043 12/11/22 1351       Objective: Vitals:   12/17/22 0405 12/17/22 0845  BP: 115/60 120/62  Pulse: (!) 56 (!) 56  Resp: 18   Temp: 97.8 F (36.6 C) 97.6 F (36.4 C)  SpO2: 99% 98%    Intake/Output Summary (Last 24 hours) at 12/17/2022 1356 Last data filed at 12/17/2022 1100 Gross per 24 hour  Intake 480 ml  Output 1050 ml  Net -570 ml   Filed Weights   12/16/22 0337 12/17/22 0406 12/17/22 0500  Weight: 94.2 kg 92.1 kg 90.2 kg   Weight change: -2.1 kg Body mass index is 30.24 kg/m.   Physical Exam: General exam: Pleasant elderly Caucasian male.  Not in pain Skin: No rashes, lesions or ulcers. HEENT: Atraumatic, normocephalic, no obvious bleeding Lungs: Clear to auscultation bilaterally CVS: Regular rate and rhythm, no murmur GI/Abd soft, distended but nontender, bowel sound present CNS: Alert, awake, oriented to place and person Psychiatry: Mood appropriate Extremities: Left leg stump right leg without pedal edema or calf tenderness  Data Review: I have personally  reviewed the laboratory data and studies available.  F/u labs ordered Unresulted Labs (From admission, onward)     Start     Ordered   12/18/22 0500  Creatinine, serum  (enoxaparin (LOVENOX)    CrCl >/= 30 ml/min)  Weekly,   R     Comments: while on enoxaparin therapy    12/11/22 1521   12/14/22 0500  Basic metabolic panel  Daily,   R      12/13/22 0904   12/14/22 0500  CBC  Daily,   R      12/13/22 0904            Total time spent in review of labs and imaging, patient evaluation, formulation of plan, documentation and communication with family: 55 minutes  Signed,  Lorin Glass, MD [facility] 12/17/2022

## 2022-12-18 DIAGNOSIS — I6529 Occlusion and stenosis of unspecified carotid artery: Secondary | ICD-10-CM | POA: Diagnosis not present

## 2022-12-18 DIAGNOSIS — M869 Osteomyelitis, unspecified: Secondary | ICD-10-CM | POA: Diagnosis not present

## 2022-12-18 DIAGNOSIS — M1990 Unspecified osteoarthritis, unspecified site: Secondary | ICD-10-CM | POA: Diagnosis not present

## 2022-12-18 DIAGNOSIS — Z7401 Bed confinement status: Secondary | ICD-10-CM | POA: Diagnosis not present

## 2022-12-18 DIAGNOSIS — R2689 Other abnormalities of gait and mobility: Secondary | ICD-10-CM | POA: Diagnosis not present

## 2022-12-18 DIAGNOSIS — M62561 Muscle wasting and atrophy, not elsewhere classified, right lower leg: Secondary | ICD-10-CM | POA: Diagnosis not present

## 2022-12-18 DIAGNOSIS — I5020 Unspecified systolic (congestive) heart failure: Secondary | ICD-10-CM | POA: Diagnosis not present

## 2022-12-18 DIAGNOSIS — Z794 Long term (current) use of insulin: Secondary | ICD-10-CM | POA: Diagnosis not present

## 2022-12-18 DIAGNOSIS — I890 Lymphedema, not elsewhere classified: Secondary | ICD-10-CM | POA: Diagnosis not present

## 2022-12-18 DIAGNOSIS — Z8673 Personal history of transient ischemic attack (TIA), and cerebral infarction without residual deficits: Secondary | ICD-10-CM | POA: Diagnosis not present

## 2022-12-18 DIAGNOSIS — L258 Unspecified contact dermatitis due to other agents: Secondary | ICD-10-CM | POA: Diagnosis not present

## 2022-12-18 DIAGNOSIS — I5042 Chronic combined systolic (congestive) and diastolic (congestive) heart failure: Secondary | ICD-10-CM | POA: Diagnosis not present

## 2022-12-18 DIAGNOSIS — I10 Essential (primary) hypertension: Secondary | ICD-10-CM | POA: Diagnosis not present

## 2022-12-18 DIAGNOSIS — E1142 Type 2 diabetes mellitus with diabetic polyneuropathy: Secondary | ICD-10-CM | POA: Diagnosis not present

## 2022-12-18 DIAGNOSIS — Z8679 Personal history of other diseases of the circulatory system: Secondary | ICD-10-CM | POA: Diagnosis not present

## 2022-12-18 DIAGNOSIS — N1831 Chronic kidney disease, stage 3a: Secondary | ICD-10-CM | POA: Diagnosis not present

## 2022-12-18 DIAGNOSIS — I2510 Atherosclerotic heart disease of native coronary artery without angina pectoris: Secondary | ICD-10-CM | POA: Diagnosis not present

## 2022-12-18 DIAGNOSIS — Z4781 Encounter for orthopedic aftercare following surgical amputation: Secondary | ICD-10-CM | POA: Diagnosis not present

## 2022-12-18 DIAGNOSIS — E785 Hyperlipidemia, unspecified: Secondary | ICD-10-CM | POA: Diagnosis not present

## 2022-12-18 DIAGNOSIS — E11621 Type 2 diabetes mellitus with foot ulcer: Secondary | ICD-10-CM | POA: Diagnosis not present

## 2022-12-18 DIAGNOSIS — Z89619 Acquired absence of unspecified leg above knee: Secondary | ICD-10-CM | POA: Diagnosis not present

## 2022-12-18 DIAGNOSIS — E1122 Type 2 diabetes mellitus with diabetic chronic kidney disease: Secondary | ICD-10-CM | POA: Diagnosis not present

## 2022-12-18 DIAGNOSIS — G4700 Insomnia, unspecified: Secondary | ICD-10-CM | POA: Diagnosis not present

## 2022-12-18 DIAGNOSIS — I499 Cardiac arrhythmia, unspecified: Secondary | ICD-10-CM | POA: Diagnosis not present

## 2022-12-18 DIAGNOSIS — I739 Peripheral vascular disease, unspecified: Secondary | ICD-10-CM | POA: Diagnosis not present

## 2022-12-18 DIAGNOSIS — Z89421 Acquired absence of other right toe(s): Secondary | ICD-10-CM | POA: Diagnosis not present

## 2022-12-18 DIAGNOSIS — Z89411 Acquired absence of right great toe: Secondary | ICD-10-CM | POA: Diagnosis not present

## 2022-12-18 DIAGNOSIS — Z89612 Acquired absence of left leg above knee: Secondary | ICD-10-CM | POA: Diagnosis not present

## 2022-12-18 DIAGNOSIS — M62562 Muscle wasting and atrophy, not elsewhere classified, left lower leg: Secondary | ICD-10-CM | POA: Diagnosis not present

## 2022-12-18 DIAGNOSIS — K5900 Constipation, unspecified: Secondary | ICD-10-CM | POA: Diagnosis not present

## 2022-12-18 DIAGNOSIS — L97509 Non-pressure chronic ulcer of other part of unspecified foot with unspecified severity: Secondary | ICD-10-CM | POA: Diagnosis not present

## 2022-12-18 LAB — BASIC METABOLIC PANEL
Anion gap: 4 — ABNORMAL LOW (ref 5–15)
BUN: 16 mg/dL (ref 8–23)
CO2: 28 mmol/L (ref 22–32)
Calcium: 8.4 mg/dL — ABNORMAL LOW (ref 8.9–10.3)
Chloride: 106 mmol/L (ref 98–111)
Creatinine, Ser: 1.17 mg/dL (ref 0.61–1.24)
GFR, Estimated: >60 mL/min (ref >60)
Glucose, Bld: 102 mg/dL — ABNORMAL HIGH (ref 70–99)
Potassium: 5.6 mmol/L — ABNORMAL HIGH (ref 3.5–5.1)
Sodium: 138 mmol/L (ref 135–145)

## 2022-12-18 LAB — GLUCOSE, CAPILLARY
Glucose-Capillary: 90 mg/dL (ref 70–99)
Glucose-Capillary: 96 mg/dL (ref 70–99)

## 2022-12-18 LAB — CBC
HCT: 43.8 % (ref 39.0–52.0)
Hemoglobin: 13.6 g/dL (ref 13.0–17.0)
MCH: 30.4 pg (ref 26.0–34.0)
MCHC: 31.1 g/dL (ref 30.0–36.0)
MCV: 97.8 fL (ref 80.0–100.0)
Platelets: 329 10*3/uL (ref 150–400)
RBC: 4.48 MIL/uL (ref 4.22–5.81)
RDW: 14.3 % (ref 11.5–15.5)
WBC: 9.5 10*3/uL (ref 4.0–10.5)
nRBC: 0 % (ref 0.0–0.2)

## 2022-12-18 MED ORDER — OXYCODONE HCL 10 MG PO TABS: 10.0000 mg | ORAL_TABLET | Freq: Four times a day (QID) | ORAL | 0 refills | Status: AC | PRN

## 2022-12-18 MED ORDER — SODIUM ZIRCONIUM CYCLOSILICATE 10 G PO PACK
10.0000 g | PACK | Freq: Once | ORAL | Status: AC
  Administered 2022-12-18: 10 g via ORAL
  Filled 2022-12-18: qty 1

## 2022-12-18 MED ORDER — SMOG ENEMA
960.0000 mL | Freq: Once | RECTAL | Status: AC
  Administered 2022-12-18: 960 mL via RECTAL
  Filled 2022-12-18: qty 960

## 2022-12-18 MED ORDER — INSULIN ASPART 100 UNIT/ML IJ SOLN: 0.0000 [IU] | Freq: Three times a day (TID) | INTRAMUSCULAR | Status: DC

## 2022-12-18 MED ORDER — FUROSEMIDE 20 MG PO TABS: 20.0000 mg | ORAL_TABLET | Freq: Every day | ORAL | Status: DC

## 2022-12-18 MED ORDER — POLYETHYLENE GLYCOL 3350 17 G PO PACK: 17.0000 g | PACK | Freq: Every day | ORAL | Status: DC | PRN

## 2022-12-18 MED ORDER — INSULIN ASPART 100 UNIT/ML IJ SOLN: 0.0000 [IU] | Freq: Every day | INTRAMUSCULAR | Status: DC

## 2022-12-18 MED ORDER — SENNOSIDES-DOCUSATE SODIUM 8.6-50 MG PO TABS: 1.0000 | ORAL_TABLET | Freq: Every day | ORAL | Status: DC

## 2022-12-18 NOTE — NC FL2 (Signed)
[place] MEDICAID FL2 LEVEL OF CARE FORM     IDENTIFICATION  Patient Name: James Dougherty Birthdate: [birth year] Sex: male Admission Date (Current Location): 12/11/2022  [facility] and [place] Number:  Producer, television/film/video and Address:  [facility] [place], [address] [place], [place] [address]      Provider Number: [id number]  Attending Physician Name and Address:  Lorin Glass, MD  Relative Name and Phone Number:       Current Level of Care: Hospital Recommended Level of Care: Skilled Nursing Facility Prior Approval Number:    Date Approved/Denied:   PASRR Number: [id number]  Discharge Plan: SNF    Current Diagnoses: Patient Active Problem List   Diagnosis Date Noted   Multiple open wounds of left lower extremity 12/11/2022   S/P AKA (above knee amputation) unilateral, left (HCC) 12/11/2022   S/P AKA (above knee amputation) unilateral (HCC) 12/11/2022   Lymphedema 12/03/2021   Neuropathic pain 12/03/2021   TBI (traumatic brain injury) (HCC) 11/04/2021   Subdural hematoma (HCC) 10/28/2021   Gangrene (HCC) 04/26/2017   Cellulitis 04/01/2017   Gangrene of toe (HCC) 04/01/2017   Anemia 04/01/2017   Hyperkalemia 04/01/2017   Chronic kidney disease 04/01/2017   Type II diabetes mellitus with complication (HCC) 04/01/2017   Dry gangrene (HCC) 04/01/2017   Groin pain 07/04/2014   Peripheral vascular disease, unspecified (HCC) 06/27/2013   PAD (peripheral artery disease) (HCC) 12/27/2012   PVD (peripheral vascular disease) (HCC) 12/12/2012   Atherosclerosis of native arteries of the extremities with ulceration (HCC) 03/21/2012   Pain in limb 03/21/2012    Orientation RESPIRATION BLADDER Height & Weight     Self, Time, Situation, Place  Normal Continent Weight: 203 lb 4.2 oz (92.2 kg) Height:  5\' 8"  (172.7 cm)  BEHAVIORAL SYMPTOMS/MOOD NEUROLOGICAL BOWEL NUTRITION STATUS      Continent Diet (See DC summary)  AMBULATORY STATUS COMMUNICATION  OF NEEDS Skin   Extensive Assist Verbally PU Stage and Appropriate Care, Surgical wounds (St 2 Coccyx PI, L AKA)                       Personal Care Assistance Level of Assistance  Bathing, Feeding, Dressing Bathing Assistance: Maximum assistance Feeding assistance: Limited assistance Dressing Assistance: Maximum assistance     Functional Limitations Info  Sight, Hearing, Speech Sight Info: Adequate Hearing Info: Impaired Speech Info: Adequate    SPECIAL CARE FACTORS FREQUENCY  PT (By licensed PT), OT (By licensed OT)     PT Frequency: 5x week OT Frequency: 5x week            Contractures Contractures Info: Not present    Additional Factors Info  Code Status, Allergies, Psychotropic, Insulin Sliding Scale Code Status Info: Full Allergies Info: Oxycodone  Glipizide  Other  Bactrim (Sulfamethoxazole-trimethoprim) Psychotropic Info: Duloxetine Insulin Sliding Scale Info: See DC summary       Current Medications (12/18/2022):  This is the current hospital active medication list Current Facility-Administered Medications  Medication Dose Route Frequency Provider Last Rate Last Admin   0.9 %  sodium chloride infusion   Intravenous Continuous Dahal, Binaya, MD 75 mL/hr at 12/18/22 0449 Infusion Verify at 12/18/22 0449   acetaminophen (TYLENOL) tablet 1,000 mg  1,000 mg Oral TID Lorin Glass, MD   1,000 mg at 12/17/22 2233   aspirin EC tablet 81 mg  81 mg Oral Daily Kc, Dayna Barker, MD   81 mg at 12/17/22 0840   atorvastatin (  LIPITOR) tablet 20 mg  20 mg Oral QHS Hughie Closs, MD   20 mg at 12/17/22 2233   Chlorhexidine Gluconate Cloth 2 % PADS 6 each  6 each Topical Daily Lanae Boast, MD   6 each at 12/16/22 0935   clopidogrel (PLAVIX) tablet 75 mg  75 mg Oral Daily Kc, Ramesh, MD   75 mg at 12/17/22 0841   DULoxetine (CYMBALTA) DR capsule 30 mg  30 mg Oral Daily Pahwani, Daleen Bo, MD   30 mg at 12/17/22 0840   enoxaparin (LOVENOX) injection 40 mg  40 mg Subcutaneous Q24H  Pahwani, Daleen Bo, MD   40 mg at 12/17/22 1555   gabapentin (NEURONTIN) capsule 600 mg  600 mg Oral TID Hughie Closs, MD   600 mg at 12/17/22 2233   insulin aspart (novoLOG) injection 0-15 Units  0-15 Units Subcutaneous TID WC Hughie Closs, MD   2 Units at 12/17/22 0846   insulin aspart (novoLOG) injection 0-5 Units  0-5 Units Subcutaneous QHS Hughie Closs, MD   2 Units at 12/11/22 2148   insulin glargine-yfgn (SEMGLEE) injection 21 Units  21 Units Subcutaneous QHS Lorin Glass, MD   21 Units at 12/17/22 2233   magnesium hydroxide (MILK OF MAGNESIA) suspension 30 mL  30 mL Oral BID Dahal, Melina Schools, MD   30 mL at 12/17/22 2232   metoprolol succinate (TOPROL-XL) 24 hr tablet 50 mg  50 mg Oral Daily Hughie Closs, MD   50 mg at 12/17/22 0841   oxyCODONE (Oxy IR/ROXICODONE) immediate release tablet 10 mg  10 mg Oral Q6H PRN Lorin Glass, MD   10 mg at 12/18/22 0438   polyethylene glycol (MIRALAX / GLYCOLAX) packet 17 g  17 g Oral Daily Lanae Boast, MD   17 g at 12/17/22 0844     Discharge Medications: Please see discharge summary for a list of discharge medications.  Relevant Imaging Results:  Relevant Lab Results:   Additional Information SS# [id number]  Carley Hammed, LCSW

## 2022-12-18 NOTE — TOC Transition Note (Signed)
Transition of Care [facility]) - CM/SW Discharge Note   Patient Details  Name: James Dougherty MRN: [id number] Date of Birth: [birth year]  Transition of Care [facility]) CM/SW Contact:  Carley Hammed, LCSW Phone Number: 12/18/2022, 11:54 AM   Clinical Narrative:    Pt to be transported to [facility] Nurse to call report to [phone]   Final next level of care: Skilled Nursing Facility Barriers to Discharge: Barriers Resolved   Patient Goals and CMS Choice      Discharge Placement                Patient chooses bed at:  [facility]) Patient to be transferred to facility by: [facility] Name of family member notified: [name] Patient and family notified of of transfer: 12/18/22  Discharge Plan and Services Additional resources added to the After Visit Summary for   In-house Referral: Clinical Social Work                                   Social Determinants of Health (SDOH) Interventions SDOH Screenings   Food Insecurity: No Food Insecurity (12/15/2022)  Housing: Low Risk  (12/15/2022)  Transportation Needs: No Transportation Needs (12/15/2022)  Utilities: Not At Risk (12/15/2022)  Depression (PHQ2-9): Low Risk  (12/03/2021)  Financial Resource Strain: Patient Declined (10/20/2022)   [facility]  Physical Activity: Inactive (10/20/2022)   Received from [facility]  Social Connections: Patient Declined (10/20/2022)   Received [facility]  Stress: No Stress Concern Present (10/20/2022)   Received from [facility]  Tobacco Use: Medium Risk (12/11/2022)  Health Literacy: Medium Risk (11/02/2022)   Received from [facility]     Readmission Risk Interventions     No data to display

## 2022-12-18 NOTE — Progress Notes (Signed)
  Progress Note    12/18/2022 7:16 AM 7 Days Post-Op  Subjective:  says his pain is better as long as he doesn't move.  Says he had 2 stump socks and there is only one in the room.  Says he had to sign for 2 of them.    Afebrile  Vitals:   12/17/22 1958 12/18/22 0619  BP: 104/69 (!) 128/55  Pulse:  (!) 55  Resp: 17 17  Temp: 97.7 F (36.5 C) (!) 97.4 F (36.3 C)  SpO2: 98% 97%    Physical Exam: Incisions:  looks good with staples in tact.  Unchanged from Wednesday.   CBC    Component Value Date/Time   WBC 8.1 12/17/2022 0756   RBC 4.44 12/17/2022 0756   HGB 13.6 12/17/2022 0756   HCT 42.2 12/17/2022 0756   PLT 294 12/17/2022 0756   MCV 95.0 12/17/2022 0756   MCH 30.6 12/17/2022 0756   MCHC 32.2 12/17/2022 0756   RDW 14.1 12/17/2022 0756   LYMPHSABS 1.5 11/05/2021 0743   MONOABS 0.8 11/05/2021 0743   EOSABS 0.5 11/05/2021 0743   BASOSABS 0.0 11/05/2021 0743    BMET    Component Value Date/Time   NA 138 12/17/2022 0756   K 4.8 12/17/2022 0756   CL 104 12/17/2022 0756   CO2 22 12/17/2022 0756   GLUCOSE 168 (H) 12/17/2022 0756   BUN 18 12/17/2022 0756   CREATININE 1.38 (H) 12/17/2022 0756   CALCIUM 8.4 (L) 12/17/2022 0756   GFRNONAA 54 (L) 12/17/2022 0756   GFRAA 47 (L) 04/28/2017 0238    INR    Component Value Date/Time   INR 1.2 12/11/2022 1105     Intake/Output Summary (Last 24 hours) at 12/18/2022 0716 Last data filed at 12/18/2022 0449 Gross per 24 hour  Intake 1746.25 ml  Output 1400 ml  Net 346.25 ml     Assessment/Plan:  72 y.o. male is s/p left above knee amputation   7 Days Post-Op   -incision looks good.  Clean with soap and water daily and apply clean bandage or stump sock.  -back to SNF probably today.  -f/u with [facility] in 4-5 weeks for staple removal.  Our office will arrange appt.     Doreatha Massed, PA-C [facility] [phone] 12/18/2022 7:16 AM

## 2022-12-18 NOTE — Progress Notes (Signed)
Called report to [employer] on [employer] at [facility].  Erick Blinks, RN

## 2022-12-18 NOTE — Plan of Care (Signed)

## 2022-12-18 NOTE — Discharge Summary (Signed)
Physician Discharge Summary  [name] ZVJ:[id number] DOB: [birth year] DOA: 12/11/2022  PCP: [name], MD  Admit date: 12/11/2022 Discharge date: 12/18/2022  Admitted From: SNF Discharge disposition: Back to SNF   Brief narrative: James Dougherty is a 72 y.o. male with PMH significant for obesity, DM2, HTN, HLD, stroke, CKD, neuropathy, PAD, lymphedema, critical right ICA stenosis. 11/15, patient was brought to the hospital for elective AKA for a nonhealing left lower extremity wound.  In September, he had revascularization attempt by Dr. Sherral Hammers but unfortunately he developed necrotic wounds on the foot and ultimately required left AKA on 11/15. Postop, patient was seen by [facility] for medical management and admitted to hospital. See below for details  Subjective: Patient was seen and examined this morning..  Lying on bed.  Not in distress no new symptoms.  Had 2 brief bowel movements in the last 24 hours. 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   Pain control with scheduled Tylenol, as needed oxycodone.  Avoid IV Dilaudid Followed by vascular surgery while in the hospital.  Per vascular surgery note from 11/20, left AKA stump looks good.  Patient did not tolerate rewrap attempt with ace wrap..  Stump sock has been ordered.   Patient to follow-up with vascular surgery in 4 to 5 weeks.  Removal.     Chronic combined systolic and diastolic congestive heart failure Hypertension Last TTE May/2023-LVEF 30-40% , g3dd PTA meds- Toprol 50 mg daily, Lasix 20 mg twice daily, Jardiance 10 mg daily Currently continued on Toprol 50 mg daily.  Lasix and Jardiance on hold. Looks euvolemic but blood pressures trending up.. At discharge, I would resume Lasix at a lower dose of 20 mg daily and Jardiance 10 mg daily.  H/o stroke  right ICA  stenosis HLD Continue aspirin, Plavix and statin  Type 2 diabetes mellitus uncontrolled A1c 8.4 on 12/11/2022 PTA meds-Trulicity weekly, Levemir at bedtime, Jardiance 10 mg daily Resume with SSI/Accu-Cheks. Continue gabapentin for neuropathy Recent Labs  Lab 12/17/22 1152 12/17/22 1657 12/17/22 2001 12/18/22 0735 12/18/22 1145  GLUCAP 116* 102* 137* 90 96   CKD 3B Creatinine better today with hydration. Recent Labs    09/30/22 0745 12/11/22 1105 12/11/22 1934 12/12/22 0330 12/13/22 0925 12/14/22 0521 12/15/22 0535 12/16/22 0749 12/17/22 0756 12/18/22 0656  BUN 17 15  --  21 26* 23 19 20 18 16   CREATININE 1.60* 1.62* 1.37* 1.32* 1.22 1.29* 1.09 1.17 1.38* 1.17   Constipation: Constipation resolved with scheduled and as needed bowel regimen.  Continue same post discharge.  Urine retention: Required Foley catheter on 11/16 Pased voiding trial 11/20.   Obesity  Body mass index is 30.91 kg/m. Patient has been advised to make an attempt to improve diet and exercise patterns to aid in weight loss.  Pressure injury POA coccyx stage II  continue wound care  Goals of care   Code Status: Full Code   Wounds:  - Wound / Incision (Open or Dehisced) 11/04/21 (ITD) Intertriginous Dermatitis Breast Left;Lower Underneath left breast (Active)  Date First Assessed/Time First Assessed: 11/04/21 1634   Wound Type: (ITD) Intertriginous Dermatitis  Location: Breast  Location Orientation: Left;Lower  Wound Description (Comments): Underneath left breast  Present on Admission: Yes    Assessments 11/06/2021  8:15 PM 11/14/2021 10:00 AM  Dressing Type None None  Site / Wound Assessment -- Clean;Dry  Drainage Amount -- None     No associated orders.     Wound / Incision (Open or Dehisced) 11/04/21 (ITD) Intertriginous Dermatitis Hip Anterior;Left left lower abdominal fold (Active)  Date First Assessed/Time First Assessed: 11/04/21 1634   Wound Type: (ITD) Intertriginous Dermatitis   Location: Hip  Location Orientation: Anterior;Left  Wound Description (Comments): left lower abdominal fold  Present on Admission: Yes    Assessments 11/08/2021  9:06 PM 11/14/2021 10:00 AM  Dressing Type -- None  Site / Wound Assessment -- Clean;Dry  Peri-wound Assessment -- Intact  Drainage Amount None None     No associated orders.     Wound / Incision (Open or Dehisced) 11/04/21 (ITD) Intertriginous Dermatitis Groin Anterior;Left;Proximal extention from left abdominal fold ITD (Active)  Date First Assessed/Time First Assessed: 11/04/21 1634   Wound Type: (ITD) Intertriginous Dermatitis  Location: Groin  Location Orientation: Anterior;Left;Proximal  Wound Description (Comments): extention from left abdominal fold ITD  Present on Admis...    Assessments 11/09/2021  7:26 AM 11/14/2021 10:00 AM  Dressing Type None None  Site / Wound Assessment Clean;Dry;Pink;Red Pink;Clean  Drainage Amount -- None  Treatment Other (Comment) --     No associated orders.     Pressure Injury 11/04/21 Coccyx Mid Deep Tissue Pressure Injury - Purple or maroon localized area of discolored intact skin or blood-filled blister due to damage of underlying soft tissue from pressure and/or shear. non-blanchable purple area surrounded  (Active)  Date First Assessed/Time First Assessed: 11/04/21 1634   Location: Coccyx  Location Orientation: Mid  Staging: Deep Tissue Pressure Injury - Purple or maroon localized area of discolored intact skin or blood-filled blister due to damage of underlying ...    Assessments 11/04/2021  4:52 PM 11/14/2021 10:00 AM  Dressing Type -- None  Dressing -- Clean, Dry, Intact  Site / Wound Assessment -- Dry;Pink  Peri-wound Assessment -- Intact  Wound Length (cm) 5 cm --  Wound Width (cm) 4.3 cm --  Wound Surface Area (cm^2) 21.5 cm^2 --  Margins -- Attached edges (approximated)  Drainage Amount -- None     No associated orders.     Wound / Incision (Open or Dehisced) 11/10/21  Non-pressure wound Toe (Comment  which one) Anterior;Right two abrasions (Active)  Date First Assessed/Time First Assessed: 11/10/21 0730   Wound Type: Non-pressure wound  Location: (c) Toe (Comment  which one)  Location Orientation: Anterior;Right  Wound Description (Comments): two abrasions  Present on Admission: No    Assessments 11/10/2021  7:35 AM 11/14/2021 10:00 AM  Dressing Type Foam - Lift dressing to assess site every shift None  Dressing Changed New New  Dressing Status Clean, Dry, Intact Clean, Dry, Intact  Dressing Change Frequency PRN PRN  Site / Wound Assessment Pink Clean;Dry;Pink  Peri-wound Assessment -- Pink  Closure None None  Drainage Amount None None  Treatment Cleansed --     No associated orders.     Incision (Closed) 12/11/22 Leg (Active)  Date First Assessed/Time First Assessed: 12/11/22 1448   Location: Leg    Assessments 12/11/2022  3:10 PM 12/17/2022  8:07 PM  Dressing Type Thin film;Abdominal pads Gauze (Comment)  Dressing Clean, Dry, Intact Clean, Dry, Intact  Dressing Change Frequency -- PRN  Site / Wound Assessment Dressing in place / Unable to assess Clean;Dry  Margins -- Attached edges (approximated)  Closure -- Staples  Drainage Amount None None  Treatment -- Off loading     No associated orders.  Pressure Injury 12/11/22 Coccyx Stage 2 -  Partial thickness loss of dermis presenting as a shallow open injury with a red, pink wound bed without slough. (Active)  Date First Assessed/Time First Assessed: 12/11/22 1915   Location: Coccyx  Staging: Stage 2 -  Partial thickness loss of dermis presenting as a shallow open injury with a red, pink wound bed without slough.  Present on Admission: Yes    Assessments 12/11/2022  7:45 PM 12/17/2022  8:07 PM  Dressing Type Foam - Lift dressing to assess site every shift Foam - Lift dressing to assess site every shift  Dressing Clean, Dry, Intact Changed;Clean, Dry, Intact  Dressing Change Frequency -- Every  3 days  Site / Wound Assessment Dry;Painful Pink;Red;Bleeding  Peri-wound Assessment -- Erythema (blanchable)  Drainage Amount None --  Treatment Cleansed Off loading     No associated orders.    Discharge Exam:   Vitals:   12/17/22 1958 12/18/22 0500 12/18/22 0619 12/18/22 0734  BP: 104/69  (!) 128/55 (!) 138/57  Pulse:   (!) 55 (!) 58  Resp: 17  17 18   Temp: 97.7 F (36.5 C)  (!) 97.4 F (36.3 C) 98.2 F (36.8 C)  TempSrc: Oral     SpO2: 98%  97% 97%  Weight:  92.2 kg    Height:        Body mass index is 30.91 kg/m.   General exam: Pleasant elderly Caucasian male.  Not in pain Skin: No rashes, lesions or ulcers. HEENT: Atraumatic, normocephalic, no obvious bleeding Lungs: Clear to auscultation bilaterally CVS: Regular rate and rhythm, no murmur GI/Abd soft, distended but nontender, bowel sound present CNS: Alert, awake, oriented to place and person Psychiatry: Mood appropriate Extremities: Left leg stump right leg without pedal edema or calf tenderness  Follow ups:    Contact information for follow-up providers     [facility] Follow up in 5 week(s).   Specialty: Vascular Surgery Why: Office will call you to arrange your appt (sent). Contact information: [address] [place] [address] [phone]        [name], MD Follow up.   Specialty: Family Medicine Contact information: [address] [place] [address] [phone]         [name], MD Follow up.   Specialty: Family Medicine Contact information: [address] [place] [address] [phone]              Contact information for after-discharge care     Destination     [facility] Preferred SNF .   Service: Skilled Nursing Contact information: [address] [place] [address] [phone]                     Discharge Instructions:   Discharge Instructions      Call MD for:  difficulty breathing, headache or visual disturbances   Complete by: As directed    Call MD for:  extreme fatigue   Complete by: As directed    Call MD for:  hives   Complete by: As directed    Call MD for:  persistant dizziness or light-headedness   Complete by: As directed    Call MD for:  persistant nausea and vomiting   Complete by: As directed    Call MD for:  severe uncontrolled pain   Complete by: As directed    Call MD for:  temperature >100.4   Complete by: As directed    Diet Carb Modified   Complete by: As directed    Discharge instructions   Complete by: As directed    General discharge instructions: Follow with Primary MD Neita Carp, Clarene Critchley, MD in 7 days  Please request your PCP  to go over your hospital tests, procedures, radiology results at the follow up. Please get your medicines reviewed and adjusted.  Your PCP may decide to repeat certain labs or tests as needed. Do not drive, operate heavy machinery, perform activities at heights, swimming or participation in water activities or provide baby sitting services if your were admitted for syncope or siezures until you have seen by Primary MD or a Neurologist and advised to do so again. [place] Controlled Substance Reporting System database was reviewed. Do not drive, operate heavy machinery, perform activities at heights, swim, participate in water activities or provide baby-sitting services while on medications for pain, sleep and mood until your outpatient physician has reevaluated you and advised to do so again.  You are strongly recommended to comply with the dose, frequency and duration of prescribed medications. Activity: As tolerated with Full fall precautions use walker/cane & assistance as needed Avoid using any recreational substances like cigarette, tobacco, alcohol, or non-prescribed drug. If you experience worsening of your admission symptoms, develop shortness of breath, life threatening  emergency, suicidal or homicidal thoughts you must seek medical attention immediately by calling 911 or calling your MD immediately  if symptoms less severe. You must read complete instructions/literature along with all the possible adverse reactions/side effects for all the medicines you take and that have been prescribed to you. Take any new medicine only after you have completely understood and accepted all the possible adverse reactions/side effects.  Wear Seat belts while driving. You were cared for by a hospitalist during your hospital stay. If you have any questions about your discharge medications or the care you received while you were in the hospital after you are discharged, you can call the unit and ask to speak with the hospitalist or the covering physician. Once you are discharged, your primary care physician will handle any further medical issues. Please note that NO REFILLS for any discharge medications will be authorized once you are discharged, as it is imperative that you return to your primary care physician (or establish a relationship with a primary care physician if you do not have one).   Discharge wound care:   Complete by: As directed    Increase activity slowly   Complete by: As directed        Discharge Medications:   Allergies as of 12/18/2022       Reactions   Oxycodone Nausea And Vomiting   Glipizide    Bad headache and blurred vision   Other    Soft silicone dressing caused wound to get worse   Bactrim [sulfamethoxazole-trimethoprim] Itching, Rash        Medication List     STOP taking these medications    traMADol 50 MG tablet Commonly known as: ULTRAM       TAKE these medications    aspirin EC 81 MG tablet Take 81 mg by mouth daily. Swallow whole.   atorvastatin 20 MG tablet Commonly known as: LIPITOR Take 1 tablet (20 mg total) by mouth at bedtime. Too soon to refill   bisacodyl 5 MG EC tablet Commonly known as: DULCOLAX Take 5 mg by  mouth daily as needed for moderate  constipation.   CALMOSEPTINE EX Apply 1 Application topically in the morning and at bedtime.   clopidogrel 75 MG tablet Commonly known as: Plavix Take 1 tablet (75 mg total) by mouth daily.   DULoxetine 30 MG capsule Commonly known as: CYMBALTA Take 30 mg by mouth daily.   empagliflozin 10 MG Tabs tablet Commonly known as: JARDIANCE Take 1 tablet (10 mg total) by mouth daily.   furosemide 20 MG tablet Commonly known as: LASIX Take 1 tablet (20 mg total) by mouth daily. What changed: when to take this   gabapentin 300 MG capsule Commonly known as: NEURONTIN Take 600 mg by mouth 3 (three) times daily.   insulin aspart 100 UNIT/ML injection Commonly known as: novoLOG Inject 0-15 Units into the skin 3 (three) times daily with meals.   insulin aspart 100 UNIT/ML injection Commonly known as: novoLOG Inject 0-5 Units into the skin at bedtime.   insulin detemir 100 UNIT/ML injection Commonly known as: LEVEMIR Inject 21 Units into the skin at bedtime.   Melatonin 3 MG Caps Take 3 mg by mouth at bedtime as needed (sleep).   metoprolol succinate 50 MG 24 hr tablet Commonly known as: TOPROL-XL Take 50 mg by mouth daily. Take with or immediately following a meal.   Oxycodone HCl 10 MG Tabs Take 1 tablet (10 mg total) by mouth every 6 (six) hours as needed for up to 5 days for moderate pain (pain score 4-6).   polyethylene glycol 17 g packet Commonly known as: MIRALAX / GLYCOLAX Take 17 g by mouth daily as needed.   Protein Powd Take 6 g by mouth in the morning, at noon, and at bedtime.   senna-docusate 8.6-50 MG tablet Commonly known as: Senokot-S Take 1 tablet by mouth daily.   Trulicity 1.5 MG/0.5ML Soaj Generic drug: Dulaglutide Inject 1.5 mg into the skin once a week. Takes weekly on Saturday               Discharge Care Instructions  (From admission, onward)           Start     Ordered   12/18/22 0000  Discharge  wound care:        12/18/22 1151             The results of significant diagnostics from this hospitalization (including imaging, microbiology, ancillary and laboratory) are listed below for reference.    Procedures and Diagnostic Studies:   No results found.   Labs:   Basic Metabolic Panel: Recent Labs  Lab 12/14/22 0521 12/15/22 0535 12/16/22 0749 12/17/22 0756 12/18/22 0656  NA 134* 134* 137 138 138  K 4.6 4.4 4.8 4.8 5.6*  CL 106 104 104 104 106  CO2 21* 23 25 22 28   GLUCOSE 152* 118* 105* 168* 102*  BUN 23 19 20 18 16   CREATININE 1.29* 1.09 1.17 1.38* 1.17  CALCIUM 7.8* 8.1* 8.5* 8.4* 8.4*   GFR Estimated Creatinine Clearance: 62.9 mL/min (by C-G formula based on SCr of 1.17 mg/dL). Liver Function Tests: Recent Labs  Lab 12/12/22 0330  AST 16  ALT 19  ALKPHOS 91  BILITOT 0.8  PROT 6.7  ALBUMIN 2.8*   No results for input(s): "LIPASE", "AMYLASE" in the last 168 hours. No results for input(s): "AMMONIA" in the last 168 hours. Coagulation profile No results for input(s): "INR", "PROTIME" in the last 168 hours.  CBC: Recent Labs  Lab 12/14/22 0521 12/15/22 0535 12/16/22 0749 12/17/22 0756 12/18/22 0656  WBC 13.1*  11.6* 9.8 8.1 9.5  HGB 12.5* 12.9* 13.3 13.6 13.6  HCT 39.5 40.3 41.5 42.2 43.8  MCV 98.0 96.0 96.7 95.0 97.8  PLT 294 304 324 294 329   Cardiac Enzymes: No results for input(s): "CKTOTAL", "CKMB", "CKMBINDEX", "TROPONINI" in the last 168 hours. BNP: Invalid input(s): "POCBNP" CBG: Recent Labs  Lab 12/17/22 1152 12/17/22 1657 12/17/22 2001 12/18/22 0735 12/18/22 1145  GLUCAP 116* 102* 137* 90 96   D-Dimer No results for input(s): "DDIMER" in the last 72 hours. Hgb A1c No results for input(s): "HGBA1C" in the last 72 hours. Lipid Profile No results for input(s): "CHOL", "HDL", "LDLCALC", "TRIG", "CHOLHDL", "LDLDIRECT" in the last 72 hours. Thyroid function studies No results for input(s): "TSH", "T4TOTAL", "T3FREE",  "THYROIDAB" in the last 72 hours.  Invalid input(s): "FREET3" Anemia work up No results for input(s): "VITAMINB12", "FOLATE", "FERRITIN", "TIBC", "IRON", "RETICCTPCT" in the last 72 hours. Microbiology Recent Results (from the past 240 hour(s))  Surgical pcr screen     Status: Abnormal   Collection Time: 12/11/22 11:11 AM   Specimen: Nasal Mucosa; Nasal Swab  Result Value Ref Range Status   MRSA, PCR POSITIVE (A) NEGATIVE Final    Comment: RESULT CALLED TO, READ BACK BY AND VERIFIED WITH: RN R RAPHINOVA [id number] AT 1314 BY CM    Staphylococcus aureus POSITIVE (A) NEGATIVE Final    Comment: (NOTE) The Xpert SA Assay (FDA approved for NASAL specimens in patients 56 years of age and older), is one component of a comprehensive surveillance program. It is not intended to diagnose infection nor to guide or monitor treatment. Performed at [facility], [address] [place], [place] [address]     Time coordinating discharge: 45 minutes  Signed: Jamye Balicki  [facility] 12/18/2022, 11:51 AM

## 2022-12-18 NOTE — Care Management Important Message (Signed)
Important Message  Patient Details  Name: James Dougherty MRN: [id number] Date of Birth: 1950/08/10   Important Message Given:  Yes - Medicare IM     Sherilyn Banker 12/18/2022, 3:48 PM

## 2022-12-19 DIAGNOSIS — M869 Osteomyelitis, unspecified: Secondary | ICD-10-CM | POA: Diagnosis not present

## 2022-12-19 DIAGNOSIS — E1122 Type 2 diabetes mellitus with diabetic chronic kidney disease: Secondary | ICD-10-CM | POA: Diagnosis not present

## 2022-12-19 DIAGNOSIS — I10 Essential (primary) hypertension: Secondary | ICD-10-CM | POA: Diagnosis not present

## 2022-12-28 DIAGNOSIS — Z794 Long term (current) use of insulin: Secondary | ICD-10-CM | POA: Diagnosis not present

## 2022-12-28 DIAGNOSIS — E11621 Type 2 diabetes mellitus with foot ulcer: Secondary | ICD-10-CM | POA: Diagnosis not present

## 2022-12-28 DIAGNOSIS — L97509 Non-pressure chronic ulcer of other part of unspecified foot with unspecified severity: Secondary | ICD-10-CM | POA: Diagnosis not present

## 2022-12-28 DIAGNOSIS — L258 Unspecified contact dermatitis due to other agents: Secondary | ICD-10-CM | POA: Diagnosis not present

## 2022-12-28 DIAGNOSIS — I739 Peripheral vascular disease, unspecified: Secondary | ICD-10-CM | POA: Diagnosis not present

## 2023-01-14 DIAGNOSIS — E785 Hyperlipidemia, unspecified: Secondary | ICD-10-CM | POA: Diagnosis not present

## 2023-01-15 ENCOUNTER — Telehealth: Payer: Self-pay

## 2023-01-15 NOTE — Telephone Encounter (Signed)
Caller: Heather at [facility] transportation  Concern: surgical site is red, bloody drainage, and is likely infected (no further info was available)  Location: left leg  Procedure: Amputation L AKA  Resolution:  Appt sch for 12/26 moved to 12/23

## 2023-01-16 DIAGNOSIS — I10 Essential (primary) hypertension: Secondary | ICD-10-CM | POA: Diagnosis not present

## 2023-01-16 DIAGNOSIS — E1122 Type 2 diabetes mellitus with diabetic chronic kidney disease: Secondary | ICD-10-CM | POA: Diagnosis not present

## 2023-01-16 DIAGNOSIS — M869 Osteomyelitis, unspecified: Secondary | ICD-10-CM | POA: Diagnosis not present

## 2023-01-18 ENCOUNTER — Encounter: Payer: Self-pay | Admitting: Physician Assistant

## 2023-01-18 ENCOUNTER — Ambulatory Visit (INDEPENDENT_AMBULATORY_CARE_PROVIDER_SITE_OTHER): Payer: Medicare Other

## 2023-01-18 VITALS — BP 140/68 | HR 50 | Temp 98.1°F | Resp 24 | Ht 68.0 in | Wt 204.0 lb

## 2023-01-18 DIAGNOSIS — I739 Peripheral vascular disease, unspecified: Secondary | ICD-10-CM

## 2023-01-18 NOTE — Progress Notes (Signed)
POST OPERATIVE OFFICE NOTE    CC:  F/u for surgery  HPI:  This is a 72 y.o. 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 Karin Lieu on 12/11/22. He has no revascularization options.  He resides at a SNF for rehab.  They called our office with reports of  surgical site appearance of erythema, bloody drainage, and is likely infected.  He was schedule to come to the office today for exam.      Pt returns today for follow up.  Pt states the staff does not clean his stump very often, but they do change the dressing and the MD there did place him on oral antibiotics for 7 days.  He has also noticed new toe darkening on the third and 4th toes on the right foot.  He has a history of right toe amputation by DPM in the past.  He is medically managed for DM and is insulin dependent.  The pt is on a statin for cholesterol management.    The pt is on an aspirin.    Other AC:  Plavix The pt is on ARB, diuretic for hypertension.    Allergies  Allergen Reactions   Oxycodone Nausea And Vomiting   Glipizide     Bad headache and blurred vision   Other     Soft silicone dressing caused wound to get worse   Bactrim [Sulfamethoxazole-Trimethoprim] Itching and Rash    Current Outpatient Medications  Medication Sig Dispense Refill   aspirin EC 81 MG tablet Take 81 mg by mouth daily. Swallow whole.     atorvastatin (LIPITOR) 20 MG tablet Take 1 tablet (20 mg total) by mouth at bedtime. Too soon to refill 30 tablet 0   bisacodyl (DULCOLAX) 5 MG EC tablet Take 5 mg by mouth daily as needed for moderate constipation.     clopidogrel (PLAVIX) 75 MG tablet Take 1 tablet (75 mg total) by mouth daily. (Patient not taking: Reported on 12/09/2022) 30 tablet 11   Dulaglutide (TRULICITY) 1.5 MG/0.5ML SOAJ Inject 1.5 mg into the skin once a week. Takes weekly on Saturday     DULoxetine (CYMBALTA) 30 MG capsule Take 30 mg by mouth daily.     empagliflozin (JARDIANCE) 10  MG TABS tablet Take 1 tablet (10 mg total) by mouth daily. 90 tablet 3   furosemide (LASIX) 20 MG tablet Take 1 tablet (20 mg total) by mouth daily.     gabapentin (NEURONTIN) 300 MG capsule Take 600 mg by mouth 3 (three) times daily.     insulin aspart (NOVOLOG) 100 UNIT/ML injection Inject 0-15 Units into the skin 3 (three) times daily with meals.     insulin aspart (NOVOLOG) 100 UNIT/ML injection Inject 0-5 Units into the skin at bedtime.     insulin detemir (LEVEMIR) 100 UNIT/ML injection Inject 21 Units into the skin at bedtime.     Melatonin 3 MG CAPS Take 3 mg by mouth at bedtime as needed (sleep).     Menthol-Zinc Oxide (CALMOSEPTINE EX) Apply 1 Application topically in the morning and at bedtime.     metoprolol succinate (TOPROL-XL) 50 MG 24 hr tablet Take 50 mg by mouth daily. Take with or immediately following a meal.     polyethylene glycol (MIRALAX / GLYCOLAX) 17 g packet Take 17 g by mouth daily as needed.     Protein POWD Take 6 g by mouth in the morning, at noon, and  at bedtime.     senna-docusate (SENOKOT-S) 8.6-50 MG tablet Take 1 tablet by mouth daily.     No current facility-administered medications for this visit.     ROS:  See HPI  Physical Exam:         Incision:  medial left AKA incision with superficial wounds.  The staples were removed.  The incision did not sperate.  I cleaned the skin and placed steri strips over the medial aspect of the incision.  There was bleeding from the staple removal without signs of purulent drainage.   Extremities:  right foot third toe dry gangrene.  No open wounds. Neuro: decreased sensation right LE     Assessment/Plan:  This is a 72 y.o. male who is s/p:left AKA after multiple interventions.  I placed steri strips over medial incision area and dry guaze.   He denies fever and chills.  He has a new dry gangrene wound on the third toe on the right foot.  He has dampened monophasic flow on the right.  I will have him keep his  follow up in 2 weeks.  If he develops an open wound, fever or chills he should report to [facility]/call our office.  I wanted to give the left stump more time to heal and since the toes were dry I was going to allow them to demarcate.  He is at very high risk of right limb loss as well.  He is on Dr. Karin Lieu schedule for January 9th.  Of note he has not had and angiogram on the right LE just the left LE since 04/19/18.  At that time he was treated for SFA stenosis  Balloon angioplasty, right superficial femoral artery.      Mosetta Pigeon PA-C [facility] [phone]   Clinic MD:  Myra Gianotti

## 2023-01-22 ENCOUNTER — Emergency Department (HOSPITAL_COMMUNITY): Payer: Medicare Other | Admitting: Anesthesiology

## 2023-01-22 ENCOUNTER — Telehealth: Payer: Self-pay

## 2023-01-22 ENCOUNTER — Other Ambulatory Visit: Payer: Self-pay

## 2023-01-22 ENCOUNTER — Inpatient Hospital Stay (HOSPITAL_COMMUNITY)
Admission: EM | Admit: 2023-01-22 | Discharge: 2023-02-13 | DRG: 475 | Disposition: A | Discharge status: Skilled Nursing Facility | Payer: Medicare Other | Source: Skilled Nursing Facility | Attending: Internal Medicine | Admitting: Internal Medicine

## 2023-01-22 ENCOUNTER — Encounter (HOSPITAL_COMMUNITY): Payer: Self-pay | Admitting: Emergency Medicine

## 2023-01-22 ENCOUNTER — Encounter (HOSPITAL_COMMUNITY)
Admission: EM | Disposition: A | Discharge status: Skilled Nursing Facility | Payer: Self-pay | Source: Skilled Nursing Facility | Attending: Internal Medicine

## 2023-01-22 DIAGNOSIS — A419 Sepsis, unspecified organism: Principal | ICD-10-CM

## 2023-01-22 DIAGNOSIS — Z79899 Other long term (current) drug therapy: Secondary | ICD-10-CM | POA: Diagnosis not present

## 2023-01-22 DIAGNOSIS — Z87891 Personal history of nicotine dependence: Secondary | ICD-10-CM | POA: Diagnosis not present

## 2023-01-22 DIAGNOSIS — Z7982 Long term (current) use of aspirin: Secondary | ICD-10-CM

## 2023-01-22 DIAGNOSIS — T8740 Infection of amputation stump, unspecified extremity: Secondary | ICD-10-CM

## 2023-01-22 DIAGNOSIS — N189 Chronic kidney disease, unspecified: Secondary | ICD-10-CM | POA: Diagnosis not present

## 2023-01-22 DIAGNOSIS — I130 Hypertensive heart and chronic kidney disease with heart failure and stage 1 through stage 4 chronic kidney disease, or unspecified chronic kidney disease: Secondary | ICD-10-CM | POA: Diagnosis not present

## 2023-01-22 DIAGNOSIS — Z881 Allergy status to other antibiotic agents status: Secondary | ICD-10-CM

## 2023-01-22 DIAGNOSIS — E1165 Type 2 diabetes mellitus with hyperglycemia: Secondary | ICD-10-CM | POA: Diagnosis not present

## 2023-01-22 DIAGNOSIS — L97519 Non-pressure chronic ulcer of other part of right foot with unspecified severity: Secondary | ICD-10-CM | POA: Diagnosis present

## 2023-01-22 DIAGNOSIS — L89150 Pressure ulcer of sacral region, unstageable: Secondary | ICD-10-CM | POA: Diagnosis not present

## 2023-01-22 DIAGNOSIS — I6521 Occlusion and stenosis of right carotid artery: Secondary | ICD-10-CM | POA: Diagnosis present

## 2023-01-22 DIAGNOSIS — I509 Heart failure, unspecified: Secondary | ICD-10-CM

## 2023-01-22 DIAGNOSIS — E7800 Pure hypercholesterolemia, unspecified: Secondary | ICD-10-CM | POA: Diagnosis present

## 2023-01-22 DIAGNOSIS — E872 Acidosis, unspecified: Secondary | ICD-10-CM | POA: Diagnosis present

## 2023-01-22 DIAGNOSIS — E1142 Type 2 diabetes mellitus with diabetic polyneuropathy: Secondary | ICD-10-CM | POA: Diagnosis not present

## 2023-01-22 DIAGNOSIS — Z7902 Long term (current) use of antithrombotics/antiplatelets: Secondary | ICD-10-CM

## 2023-01-22 DIAGNOSIS — Z743 Need for continuous supervision: Secondary | ICD-10-CM | POA: Diagnosis not present

## 2023-01-22 DIAGNOSIS — B356 Tinea cruris: Secondary | ICD-10-CM | POA: Diagnosis present

## 2023-01-22 DIAGNOSIS — E11621 Type 2 diabetes mellitus with foot ulcer: Secondary | ICD-10-CM | POA: Diagnosis present

## 2023-01-22 DIAGNOSIS — Z8673 Personal history of transient ischemic attack (TIA), and cerebral infarction without residual deficits: Secondary | ICD-10-CM

## 2023-01-22 DIAGNOSIS — Z6830 Body mass index (BMI) 30.0-30.9, adult: Secondary | ICD-10-CM | POA: Diagnosis not present

## 2023-01-22 DIAGNOSIS — I739 Peripheral vascular disease, unspecified: Secondary | ICD-10-CM | POA: Diagnosis present

## 2023-01-22 DIAGNOSIS — H9190 Unspecified hearing loss, unspecified ear: Secondary | ICD-10-CM | POA: Diagnosis present

## 2023-01-22 DIAGNOSIS — I110 Hypertensive heart disease with heart failure: Secondary | ICD-10-CM | POA: Diagnosis not present

## 2023-01-22 DIAGNOSIS — E66811 Obesity, class 1: Secondary | ICD-10-CM | POA: Diagnosis present

## 2023-01-22 DIAGNOSIS — N183 Chronic kidney disease, stage 3 unspecified: Secondary | ICD-10-CM

## 2023-01-22 DIAGNOSIS — E1122 Type 2 diabetes mellitus with diabetic chronic kidney disease: Secondary | ICD-10-CM | POA: Diagnosis present

## 2023-01-22 DIAGNOSIS — I5042 Chronic combined systolic (congestive) and diastolic (congestive) heart failure: Secondary | ICD-10-CM | POA: Diagnosis present

## 2023-01-22 DIAGNOSIS — D62 Acute posthemorrhagic anemia: Secondary | ICD-10-CM | POA: Diagnosis not present

## 2023-01-22 DIAGNOSIS — T8744 Infection of amputation stump, left lower extremity: Secondary | ICD-10-CM | POA: Diagnosis not present

## 2023-01-22 DIAGNOSIS — E119 Type 2 diabetes mellitus without complications: Secondary | ICD-10-CM | POA: Diagnosis not present

## 2023-01-22 DIAGNOSIS — Z515 Encounter for palliative care: Secondary | ICD-10-CM | POA: Diagnosis not present

## 2023-01-22 DIAGNOSIS — R5381 Other malaise: Secondary | ICD-10-CM | POA: Diagnosis not present

## 2023-01-22 DIAGNOSIS — Z7984 Long term (current) use of oral hypoglycemic drugs: Secondary | ICD-10-CM

## 2023-01-22 DIAGNOSIS — R54 Age-related physical debility: Secondary | ICD-10-CM | POA: Diagnosis present

## 2023-01-22 DIAGNOSIS — Z89619 Acquired absence of unspecified leg above knee: Secondary | ICD-10-CM

## 2023-01-22 DIAGNOSIS — M869 Osteomyelitis, unspecified: Secondary | ICD-10-CM | POA: Diagnosis present

## 2023-01-22 DIAGNOSIS — E1169 Type 2 diabetes mellitus with other specified complication: Secondary | ICD-10-CM | POA: Diagnosis not present

## 2023-01-22 DIAGNOSIS — Z833 Family history of diabetes mellitus: Secondary | ICD-10-CM

## 2023-01-22 DIAGNOSIS — K5900 Constipation, unspecified: Secondary | ICD-10-CM | POA: Diagnosis not present

## 2023-01-22 DIAGNOSIS — Z7985 Long-term (current) use of injectable non-insulin antidiabetic drugs: Secondary | ICD-10-CM

## 2023-01-22 DIAGNOSIS — T8144XA Sepsis following a procedure, initial encounter: Secondary | ICD-10-CM

## 2023-01-22 DIAGNOSIS — Y835 Amputation of limb(s) as the cause of abnormal reaction of the patient, or of later complication, without mention of misadventure at the time of the procedure: Secondary | ICD-10-CM | POA: Diagnosis present

## 2023-01-22 DIAGNOSIS — R609 Edema, unspecified: Secondary | ICD-10-CM | POA: Diagnosis not present

## 2023-01-22 DIAGNOSIS — E1151 Type 2 diabetes mellitus with diabetic peripheral angiopathy without gangrene: Secondary | ICD-10-CM | POA: Diagnosis not present

## 2023-01-22 DIAGNOSIS — L089 Local infection of the skin and subcutaneous tissue, unspecified: Secondary | ICD-10-CM | POA: Diagnosis not present

## 2023-01-22 DIAGNOSIS — B9562 Methicillin resistant Staphylococcus aureus infection as the cause of diseases classified elsewhere: Secondary | ICD-10-CM | POA: Diagnosis present

## 2023-01-22 DIAGNOSIS — Z888 Allergy status to other drugs, medicaments and biological substances status: Secondary | ICD-10-CM

## 2023-01-22 DIAGNOSIS — Z1629 Resistance to other single specified antibiotic: Secondary | ICD-10-CM | POA: Diagnosis not present

## 2023-01-22 DIAGNOSIS — Z7189 Other specified counseling: Secondary | ICD-10-CM | POA: Diagnosis not present

## 2023-01-22 DIAGNOSIS — N1832 Chronic kidney disease, stage 3b: Secondary | ICD-10-CM

## 2023-01-22 DIAGNOSIS — D631 Anemia in chronic kidney disease: Secondary | ICD-10-CM | POA: Diagnosis not present

## 2023-01-22 DIAGNOSIS — Z8249 Family history of ischemic heart disease and other diseases of the circulatory system: Secondary | ICD-10-CM

## 2023-01-22 DIAGNOSIS — Z83438 Family history of other disorder of lipoprotein metabolism and other lipidemia: Secondary | ICD-10-CM

## 2023-01-22 DIAGNOSIS — A4902 Methicillin resistant Staphylococcus aureus infection, unspecified site: Secondary | ICD-10-CM | POA: Diagnosis not present

## 2023-01-22 DIAGNOSIS — Z885 Allergy status to narcotic agent status: Secondary | ICD-10-CM

## 2023-01-22 DIAGNOSIS — M79605 Pain in left leg: Secondary | ICD-10-CM | POA: Diagnosis present

## 2023-01-22 DIAGNOSIS — M792 Neuralgia and neuritis, unspecified: Secondary | ICD-10-CM | POA: Diagnosis present

## 2023-01-22 DIAGNOSIS — I2510 Atherosclerotic heart disease of native coronary artery without angina pectoris: Secondary | ICD-10-CM | POA: Diagnosis not present

## 2023-01-22 DIAGNOSIS — I129 Hypertensive chronic kidney disease with stage 1 through stage 4 chronic kidney disease, or unspecified chronic kidney disease: Secondary | ICD-10-CM | POA: Diagnosis not present

## 2023-01-22 DIAGNOSIS — Z794 Long term (current) use of insulin: Secondary | ICD-10-CM

## 2023-01-22 DIAGNOSIS — E118 Type 2 diabetes mellitus with unspecified complications: Secondary | ICD-10-CM | POA: Diagnosis not present

## 2023-01-22 HISTORY — PX: AMPUTATION: SHX166

## 2023-01-22 HISTORY — PX: APPLICATION OF WOUND VAC: SHX5189

## 2023-01-22 LAB — COMPREHENSIVE METABOLIC PANEL
ALT: 24 U/L (ref 0–44)
AST: 39 U/L (ref 15–41)
Albumin: 2.7 g/dL — ABNORMAL LOW (ref 3.5–5.0)
Alkaline Phosphatase: 86 U/L (ref 38–126)
Anion gap: 15 (ref 5–15)
BUN: 23 mg/dL (ref 8–23)
CO2: 23 mmol/L (ref 22–32)
Calcium: 8.5 mg/dL — ABNORMAL LOW (ref 8.9–10.3)
Chloride: 94 mmol/L — ABNORMAL LOW (ref 98–111)
Creatinine, Ser: 1.58 mg/dL — ABNORMAL HIGH (ref 0.61–1.24)
GFR, Estimated: 46 mL/min — ABNORMAL LOW (ref >60)
Glucose, Bld: 130 mg/dL — ABNORMAL HIGH (ref 70–99)
Potassium: 4.4 mmol/L (ref 3.5–5.1)
Sodium: 132 mmol/L — ABNORMAL LOW (ref 135–145)
Total Bilirubin: 1.5 mg/dL — ABNORMAL HIGH (ref <1.2)
Total Protein: 6.8 g/dL (ref 6.5–8.1)

## 2023-01-22 LAB — GLUCOSE, CAPILLARY
Glucose-Capillary: 141 mg/dL — ABNORMAL HIGH (ref 70–99)
Glucose-Capillary: 142 mg/dL — ABNORMAL HIGH (ref 70–99)
Glucose-Capillary: 204 mg/dL — ABNORMAL HIGH (ref 70–99)

## 2023-01-22 LAB — CBC WITH DIFFERENTIAL/PLATELET
Abs Immature Granulocytes: 0.04 10*3/uL (ref 0.00–0.07)
Basophils Absolute: 0.1 10*3/uL (ref 0.0–0.1)
Basophils Relative: 1 %
Eosinophils Absolute: 0.2 10*3/uL (ref 0.0–0.5)
Eosinophils Relative: 2 %
HCT: 44.3 % (ref 39.0–52.0)
Hemoglobin: 14.4 g/dL (ref 13.0–17.0)
Immature Granulocytes: 0 %
Lymphocytes Relative: 15 %
Lymphs Abs: 1.8 10*3/uL (ref 0.7–4.0)
MCH: 31.2 pg (ref 26.0–34.0)
MCHC: 32.5 g/dL (ref 30.0–36.0)
MCV: 96.1 fL (ref 80.0–100.0)
Monocytes Absolute: 1.6 10*3/uL — ABNORMAL HIGH (ref 0.1–1.0)
Monocytes Relative: 14 %
Neutro Abs: 8.3 10*3/uL — ABNORMAL HIGH (ref 1.7–7.7)
Neutrophils Relative %: 68 %
Platelets: 226 10*3/uL (ref 150–400)
RBC: 4.61 MIL/uL (ref 4.22–5.81)
RDW: 14.5 % (ref 11.5–15.5)
WBC: 12 10*3/uL — ABNORMAL HIGH (ref 4.0–10.5)
nRBC: 0 % (ref 0.0–0.2)

## 2023-01-22 LAB — I-STAT CG4 LACTIC ACID, ED: Lactic Acid, Venous: 2.7 mmol/L (ref 0.5–1.9)

## 2023-01-22 LAB — SURGICAL PCR SCREEN
MRSA, PCR: POSITIVE — AB
Staphylococcus aureus: POSITIVE — AB

## 2023-01-22 SURGERY — AMPUTATION, ABOVE KNEE
Anesthesia: General | Site: Knee | Laterality: Left

## 2023-01-22 MED ORDER — CHLORHEXIDINE GLUCONATE 0.12 % MT SOLN: 15.0000 mL | Freq: Once | OROMUCOSAL | Status: AC

## 2023-01-22 MED ORDER — SUCCINYLCHOLINE CHLORIDE 200 MG/10ML IV SOSY
PREFILLED_SYRINGE | INTRAVENOUS | Status: DC | PRN
  Administered 2023-01-22: 100 mg via INTRAVENOUS

## 2023-01-22 MED ORDER — CHLORHEXIDINE GLUCONATE 0.12 % MT SOLN
OROMUCOSAL | Status: AC
  Administered 2023-01-22: 15 mL via OROMUCOSAL
  Filled 2023-01-22: qty 15

## 2023-01-22 MED ORDER — HYDRALAZINE HCL 20 MG/ML IJ SOLN: 5.0000 mg | INTRAMUSCULAR | Status: DC | PRN

## 2023-01-22 MED ORDER — ORAL CARE MOUTH RINSE
15.0000 mL | Freq: Once | OROMUCOSAL | Status: AC
Start: 2023-01-22 — End: 2023-01-22

## 2023-01-22 MED ORDER — ACETAMINOPHEN 650 MG RE SUPP: 650.0000 mg | Freq: Four times a day (QID) | RECTAL | Status: DC | PRN

## 2023-01-22 MED ORDER — ROCURONIUM BROMIDE 10 MG/ML (PF) SYRINGE
PREFILLED_SYRINGE | INTRAVENOUS | Status: DC | PRN
  Administered 2023-01-22: 40 mg via INTRAVENOUS

## 2023-01-22 MED ORDER — ACETAMINOPHEN 325 MG PO TABS
650.0000 mg | ORAL_TABLET | Freq: Four times a day (QID) | ORAL | Status: DC | PRN
Start: 2023-01-22 — End: 2023-02-13

## 2023-01-22 MED ORDER — KETAMINE HCL 50 MG/5ML IJ SOSY
PREFILLED_SYRINGE | INTRAMUSCULAR | Status: DC | PRN
  Administered 2023-01-22 (×2): 25 mg via INTRAVENOUS

## 2023-01-22 MED ORDER — LACTATED RINGERS IV SOLN: INTRAVENOUS | Status: DC

## 2023-01-22 MED ORDER — CHLORHEXIDINE GLUCONATE CLOTH 2 % EX PADS
6.0000 | MEDICATED_PAD | Freq: Every day | CUTANEOUS | Status: AC
  Administered 2023-01-23 – 2023-01-27 (×5): 6 via TOPICAL

## 2023-01-22 MED ORDER — INSULIN ASPART 100 UNIT/ML IJ SOLN: 0.0000 [IU] | Freq: Every day | INTRAMUSCULAR | Status: DC

## 2023-01-22 MED ORDER — ONDANSETRON HCL 4 MG/2ML IJ SOLN
4.0000 mg | Freq: Once | INTRAMUSCULAR | Status: DC | PRN
Start: 2023-01-22 — End: 2023-01-22

## 2023-01-22 MED ORDER — SENNOSIDES-DOCUSATE SODIUM 8.6-50 MG PO TABS
1.0000 | ORAL_TABLET | Freq: Every day | ORAL | Status: DC
  Administered 2023-01-23 – 2023-02-12 (×20): 1 via ORAL
  Filled 2023-01-22 (×21): qty 1

## 2023-01-22 MED ORDER — SODIUM CHLORIDE 0.9 % IV SOLN: INTRAVENOUS | Status: AC

## 2023-01-22 MED ORDER — FENTANYL CITRATE (PF) 250 MCG/5ML IJ SOLN
INTRAMUSCULAR | Status: AC
  Filled 2023-01-22: qty 5

## 2023-01-22 MED ORDER — 0.9 % SODIUM CHLORIDE (POUR BTL) OPTIME
TOPICAL | Status: DC | PRN
  Administered 2023-01-22 (×2): 1000 mL

## 2023-01-22 MED ORDER — METOPROLOL SUCCINATE ER 50 MG PO TB24
50.0000 mg | ORAL_TABLET | Freq: Every day | ORAL | Status: DC
  Administered 2023-01-23 – 2023-02-12 (×20): 50 mg via ORAL
  Filled 2023-01-22 (×21): qty 1

## 2023-01-22 MED ORDER — MORPHINE SULFATE (PF) 2 MG/ML IV SOLN
2.0000 mg | INTRAVENOUS | Status: DC | PRN
Start: 2023-01-22 — End: 2023-02-13
  Administered 2023-01-22 – 2023-02-02 (×5): 2 mg via INTRAVENOUS
  Administered 2023-02-02: 4 mg via INTRAVENOUS
  Administered 2023-02-03 – 2023-02-11 (×5): 2 mg via INTRAVENOUS
  Filled 2023-01-22 (×15): qty 1

## 2023-01-22 MED ORDER — FENTANYL CITRATE (PF) 100 MCG/2ML IJ SOLN: 25.0000 ug | INTRAMUSCULAR | Status: DC | PRN

## 2023-01-22 MED ORDER — VANCOMYCIN HCL 1250 MG/250ML IV SOLN: 1250.0000 mg | INTRAVENOUS | Status: DC

## 2023-01-22 MED ORDER — ATORVASTATIN CALCIUM 80 MG PO TABS
80.0000 mg | ORAL_TABLET | Freq: Every day | ORAL | Status: DC
  Administered 2023-01-23 – 2023-02-12 (×21): 80 mg via ORAL
  Filled 2023-01-22 (×6): qty 1
  Filled 2023-01-22: qty 8
  Filled 2023-01-22 (×3): qty 1
  Filled 2023-01-22: qty 8
  Filled 2023-01-22 (×11): qty 1

## 2023-01-22 MED ORDER — PROPOFOL 10 MG/ML IV BOLUS
INTRAVENOUS | Status: AC
  Filled 2023-01-22: qty 20

## 2023-01-22 MED ORDER — CEFAZOLIN SODIUM-DEXTROSE 2-4 GM/100ML-% IV SOLN
INTRAVENOUS | Status: AC
  Filled 2023-01-22: qty 100

## 2023-01-22 MED ORDER — VANCOMYCIN HCL 1250 MG/250ML IV SOLN
1250.0000 mg | INTRAVENOUS | Status: DC
  Administered 2023-01-23 – 2023-01-24 (×2): 1250 mg via INTRAVENOUS
  Filled 2023-01-22 (×3): qty 250

## 2023-01-22 MED ORDER — SODIUM CHLORIDE 0.9 % IV SOLN
2.0000 g | Freq: Three times a day (TID) | INTRAVENOUS | Status: DC
  Filled 2023-01-22: qty 12.5

## 2023-01-22 MED ORDER — ONDANSETRON HCL 4 MG/2ML IJ SOLN
INTRAMUSCULAR | Status: DC | PRN
  Administered 2023-01-22: 4 mg via INTRAVENOUS

## 2023-01-22 MED ORDER — ETOMIDATE 2 MG/ML IV SOLN
INTRAVENOUS | Status: DC | PRN
  Administered 2023-01-22: 16 mg via INTRAVENOUS

## 2023-01-22 MED ORDER — GABAPENTIN 300 MG PO CAPS
600.0000 mg | ORAL_CAPSULE | Freq: Three times a day (TID) | ORAL | Status: DC
Start: 2023-01-23 — End: 2023-02-13
  Administered 2023-01-23 – 2023-02-12 (×62): 600 mg via ORAL
  Filled 2023-01-22 (×9): qty 2
  Filled 2023-01-22: qty 6
  Filled 2023-01-22 (×53): qty 2

## 2023-01-22 MED ORDER — HYDROCODONE-ACETAMINOPHEN 5-325 MG PO TABS
1.0000 | ORAL_TABLET | ORAL | Status: DC | PRN
  Administered 2023-01-22 – 2023-02-09 (×20): 2 via ORAL
  Administered 2023-02-09: 1 via ORAL
  Administered 2023-02-10 – 2023-02-12 (×7): 2 via ORAL
  Administered 2023-02-13: 1 via ORAL
  Filled 2023-01-22 (×14): qty 2
  Filled 2023-01-22: qty 1
  Filled 2023-01-22 (×5): qty 2
  Filled 2023-01-22: qty 1
  Filled 2023-01-22 (×9): qty 2

## 2023-01-22 MED ORDER — VANCOMYCIN HCL 1.5 G IV SOLR
1500.0000 mg | Freq: Once | INTRAVENOUS | Status: AC
  Administered 2023-01-22: 1500 mg via INTRAVENOUS
  Filled 2023-01-22 (×2): qty 30

## 2023-01-22 MED ORDER — POLYETHYLENE GLYCOL 3350 17 G PO PACK
17.0000 g | PACK | Freq: Every day | ORAL | Status: DC | PRN
  Administered 2023-01-28: 17 g via ORAL
  Filled 2023-01-22: qty 1

## 2023-01-22 MED ORDER — MUPIROCIN 2 % EX OINT
1.0000 | TOPICAL_OINTMENT | Freq: Two times a day (BID) | CUTANEOUS | Status: AC
  Administered 2023-01-22 – 2023-01-27 (×10): 1 via NASAL
  Filled 2023-01-22 (×3): qty 22

## 2023-01-22 MED ORDER — DEXAMETHASONE SODIUM PHOSPHATE 10 MG/ML IJ SOLN
INTRAMUSCULAR | Status: DC | PRN
  Administered 2023-01-22: 4 mg via INTRAVENOUS

## 2023-01-22 MED ORDER — SODIUM CHLORIDE 0.9 % IV SOLN
2.0000 g | Freq: Two times a day (BID) | INTRAVENOUS | Status: DC
  Administered 2023-01-22 – 2023-01-26 (×8): 2 g via INTRAVENOUS
  Filled 2023-01-22 (×7): qty 12.5

## 2023-01-22 MED ORDER — FENTANYL CITRATE (PF) 250 MCG/5ML IJ SOLN
INTRAMUSCULAR | Status: DC | PRN
  Administered 2023-01-22: 50 ug via INTRAVENOUS

## 2023-01-22 MED ORDER — ACETAMINOPHEN 10 MG/ML IV SOLN: 1000.0000 mg | Freq: Once | INTRAVENOUS | Status: DC | PRN

## 2023-01-22 MED ORDER — KETAMINE HCL 50 MG/5ML IJ SOSY
PREFILLED_SYRINGE | INTRAMUSCULAR | Status: AC
  Filled 2023-01-22: qty 5

## 2023-01-22 MED ORDER — INSULIN ASPART 100 UNIT/ML IJ SOLN
0.0000 [IU] | Freq: Three times a day (TID) | INTRAMUSCULAR | Status: DC
  Administered 2023-01-23 (×2): 3 [IU] via SUBCUTANEOUS
  Administered 2023-01-23: 2 [IU] via SUBCUTANEOUS
  Administered 2023-01-24 – 2023-01-25 (×2): 3 [IU] via SUBCUTANEOUS
  Administered 2023-01-26: 2 [IU] via SUBCUTANEOUS
  Administered 2023-01-26: 3 [IU] via SUBCUTANEOUS
  Administered 2023-01-26 – 2023-01-27 (×2): 2 [IU] via SUBCUTANEOUS
  Administered 2023-01-27 – 2023-01-28 (×2): 3 [IU] via SUBCUTANEOUS
  Administered 2023-01-28: 2 [IU] via SUBCUTANEOUS
  Administered 2023-01-29: 3 [IU] via SUBCUTANEOUS
  Administered 2023-01-29: 2 [IU] via SUBCUTANEOUS
  Administered 2023-01-31: 3 [IU] via SUBCUTANEOUS
  Administered 2023-02-02 – 2023-02-05 (×5): 2 [IU] via SUBCUTANEOUS
  Administered 2023-02-07 – 2023-02-09 (×2): 3 [IU] via SUBCUTANEOUS
  Administered 2023-02-11 – 2023-02-12 (×3): 2 [IU] via SUBCUTANEOUS

## 2023-01-22 MED ORDER — ASPIRIN 81 MG PO TBEC
81.0000 mg | DELAYED_RELEASE_TABLET | Freq: Every day | ORAL | Status: DC
  Administered 2023-01-23 – 2023-01-31 (×9): 81 mg via ORAL
  Filled 2023-01-22 (×10): qty 1

## 2023-01-22 MED ORDER — SUGAMMADEX SODIUM 200 MG/2ML IV SOLN
INTRAVENOUS | Status: DC | PRN
  Administered 2023-01-22: 200 mg via INTRAVENOUS

## 2023-01-22 MED ORDER — CEFAZOLIN SODIUM-DEXTROSE 2-3 GM-%(50ML) IV SOLR
INTRAVENOUS | Status: DC | PRN
  Administered 2023-01-22: 2 g via INTRAVENOUS

## 2023-01-22 MED ORDER — INSULIN GLARGINE-YFGN 100 UNIT/ML ~~LOC~~ SOLN
10.0000 [IU] | Freq: Every day | SUBCUTANEOUS | Status: DC
  Administered 2023-01-22 – 2023-02-12 (×19): 10 [IU] via SUBCUTANEOUS
  Filled 2023-01-22 (×23): qty 0.1

## 2023-01-22 MED ORDER — ENOXAPARIN SODIUM 40 MG/0.4ML IJ SOSY
40.0000 mg | PREFILLED_SYRINGE | INTRAMUSCULAR | Status: DC
  Administered 2023-01-23 – 2023-01-31 (×9): 40 mg via SUBCUTANEOUS
  Filled 2023-01-22 (×9): qty 0.4

## 2023-01-22 SURGICAL SUPPLY — 55 items
BAG COUNTER SPONGE SURGICOUNT (BAG) ×1 IMPLANT
BANDAGE ESMARK 6X9 LF (GAUZE/BANDAGES/DRESSINGS) IMPLANT
BLADE SAGITTAL 25.0X1.19X90 (BLADE) ×1 IMPLANT
BLADE SAW THK.89X75X18XSGTL (BLADE) IMPLANT
BLADE SURG 21 STRL SS (BLADE) ×1 IMPLANT
BNDG COHESIVE 6X5 TAN ST LF (GAUZE/BANDAGES/DRESSINGS) ×1 IMPLANT
BNDG ELASTIC 4X5.8 VLCR STR LF (GAUZE/BANDAGES/DRESSINGS) ×1 IMPLANT
BNDG ELASTIC 6X5.8 VLCR STR LF (GAUZE/BANDAGES/DRESSINGS) ×1 IMPLANT
BNDG ESMARK 6X9 LF (GAUZE/BANDAGES/DRESSINGS) IMPLANT
BNDG GAUZE DERMACEA FLUFF 4 (GAUZE/BANDAGES/DRESSINGS) ×1 IMPLANT
BUR DISC 0.8X25 (BURR) IMPLANT
CANISTER SUCT 3000ML PPV (MISCELLANEOUS) ×1 IMPLANT
CANISTER WOUND CARE 500ML ATS (WOUND CARE) ×1 IMPLANT
CHLORAPREP W/TINT 26 (MISCELLANEOUS) ×1 IMPLANT
COVER SURGICAL LIGHT HANDLE (MISCELLANEOUS) ×1 IMPLANT
CUFF TRNQT CYL 24X4X16.5-23 (TOURNIQUET CUFF) IMPLANT
CUFF TRNQT CYL 34X4.125X (TOURNIQUET CUFF) IMPLANT
DRAIN CHANNEL 19F RND (DRAIN) IMPLANT
DRAPE DERMATAC (DRAPES) IMPLANT
DRAPE HALF SHEET 40X57 (DRAPES) ×1 IMPLANT
DRAPE INCISE IOBAN 66X45 STRL (DRAPES) IMPLANT
DRAPE SURG ORHT 6 SPLT 77X108 (DRAPES) ×2 IMPLANT
DRESSING PREVENA PLUS CUSTOM (GAUZE/BANDAGES/DRESSINGS) IMPLANT
DRSG PREVENA PLUS CUSTOM (GAUZE/BANDAGES/DRESSINGS) IMPLANT
DRSG VAC GRANUFOAM MED (GAUZE/BANDAGES/DRESSINGS) IMPLANT
ELECT CAUTERY BLADE 6.4 (BLADE) ×1 IMPLANT
ELECT REM PT RETURN 9FT ADLT (ELECTROSURGICAL) ×1 IMPLANT
ELECTRODE REM PT RTRN 9FT ADLT (ELECTROSURGICAL) ×1 IMPLANT
EVACUATOR SILICONE 100CC (DRAIN) IMPLANT
GAUZE SPONGE 4X4 12PLY STRL (GAUZE/BANDAGES/DRESSINGS) ×1 IMPLANT
GAUZE XEROFORM 5X9 LF (GAUZE/BANDAGES/DRESSINGS) IMPLANT
GLOVE BIO SURGEON STRL SZ8 (GLOVE) ×1 IMPLANT
GOWN STRL REUS W/ TWL LRG LVL3 (GOWN DISPOSABLE) ×2 IMPLANT
GOWN STRL REUS W/ TWL XL LVL3 (GOWN DISPOSABLE) ×1 IMPLANT
KIT BASIN OR (CUSTOM PROCEDURE TRAY) ×1 IMPLANT
KIT PREVENA INCISION MGT20CM45 (CANNISTER) IMPLANT
KIT TURNOVER KIT B (KITS) ×1 IMPLANT
NS IRRIG 1000ML POUR BTL (IV SOLUTION) ×1 IMPLANT
PACK GENERAL/GYN (CUSTOM PROCEDURE TRAY) ×1 IMPLANT
PAD ARMBOARD 7.5X6 YLW CONV (MISCELLANEOUS) ×2 IMPLANT
PENCIL SMOKE EVACUATOR (MISCELLANEOUS) ×1 IMPLANT
PREVENA RESTOR ARTHOFORM 46X30 (CANNISTER) IMPLANT
RASP HELIOCORDIAL MED (MISCELLANEOUS) IMPLANT
STAPLER SKIN 35 REG (STAPLE) ×1 IMPLANT
STAPLER VISISTAT 35W (STAPLE) ×1 IMPLANT
STOCKINETTE IMPERVIOUS LG (DRAPES) ×1 IMPLANT
SUT ETHILON 2 0 PSLX (SUTURE) ×2 IMPLANT
SUT ETHILON 3 0 PS 1 (SUTURE) IMPLANT
SUT SILK 0 TIES 10X30 (SUTURE) ×1 IMPLANT
SUT SILK 2 0 SH CR/8 (SUTURE) ×1 IMPLANT
SUT VIC AB 2-0 CT1 18 (SUTURE) ×3 IMPLANT
TAPE UMBILICAL 1/8X30 (MISCELLANEOUS) IMPLANT
TOWEL GREEN STERILE (TOWEL DISPOSABLE) ×2 IMPLANT
UNDERPAD 30X36 HEAVY ABSORB (UNDERPADS AND DIAPERS) ×1 IMPLANT
WATER STERILE IRR 1000ML POUR (IV SOLUTION) ×1 IMPLANT

## 2023-01-22 NOTE — Anesthesia Preprocedure Evaluation (Addendum)
Anesthesia Evaluation  Patient identified by MRN, date of birth, ID band Patient awake    Reviewed: Allergy & Precautions, H&P , NPO status , Patient's Chart, lab work & pertinent test results  Airway Mallampati: II  TM Distance: >3 FB Neck ROM: Full    Dental no notable dental hx.    Pulmonary former smoker   Pulmonary exam normal breath sounds clear to auscultation       Cardiovascular hypertension, + Peripheral Vascular Disease and +CHF  Normal cardiovascular exam Rhythm:Regular Rate:Normal  Left Ventricle: Left ventricular ejection fraction, by estimation, is  35%. The left ventricle has moderately decreased function. The left  ventricle demonstrates global hypokinesis. Definity contrast agent was  given IV to delineate the left ventricular  endocardial borders.   Right Ventricle: The right ventricular size is normal. Right vetricular  wall thickness was not assessed. Right ventricular systolic function is  low normal.     Neuro/Psych CVA  negative psych ROS   GI/Hepatic negative GI ROS, Neg liver ROS,,,  Endo/Other  diabetes, Type 2    Renal/GU Renal InsufficiencyRenal disease  negative genitourinary   Musculoskeletal negative musculoskeletal ROS (+)    Abdominal   Peds negative pediatric ROS (+)  Hematology negative hematology ROS (+)   Anesthesia Other Findings   Reproductive/Obstetrics negative OB ROS                             Anesthesia Physical Anesthesia Plan  ASA: 4  Anesthesia Plan: General   Post-op Pain Management: Ketamine IV*   Induction: Intravenous and Rapid sequence  PONV Risk Score and Plan: 2 and Ondansetron, Dexamethasone and Treatment may vary due to age or medical condition  Airway Management Planned: Oral ETT  Additional Equipment:   Intra-op Plan:   Post-operative Plan: Extubation in OR  Informed Consent: I have reviewed the patients  History and Physical, chart, labs and discussed the procedure including the risks, benefits and alternatives for the proposed anesthesia with the patient or authorized representative who has indicated his/her understanding and acceptance.     Dental advisory given  Plan Discussed with: CRNA and Surgeon  Anesthesia Plan Comments: (Cereal at 0830  Etomidate for induction)       Anesthesia Quick Evaluation

## 2023-01-22 NOTE — H&P (Signed)
TRH H&P   Patient Demographics:    James Dougherty, is a 72 y.o. male  MRN: [id number]   DOB - [birth year]  Admit Date - 01/22/2023  Outpatient Primary MD for the patient is Dougherty, James Critchley, MD  Referring MD/NP/PA: Dr. Juanetta Dougherty from vascular surgery  Patient coming from: admitted from PACU after surgery, but he currently in subacute rehab at [facility] health [facility] re[facility]  Chief Complaint  Patient presents with   Leg Pain      HPI:    James Dougherty  is a 72 y.o. male, with PMH significant for obesity, DM2, HTN, HLD, stroke, CKD, neuropathy, PAD, lymphedema, critical right ICA stenosis.  With recent hospitalization member of this year, secondary to healing left lower extremity wound, status postelective AKA.  Patient was discharged to subacute rehab -Patient was seen by vascular surgery on Monday for postop evaluation, where he was noted to have left BKA with drainage from medial and lateral aspect of the stump, with concern of infection, currently somnolent in PACU cannot provide complaints, but apparently he had low-grade temperature at his facility, so he was sent for further evaluation by his vascular surgeon, where workup significant for elevated lactic acid, leukocytosis, there was clinical concern of stump infection, so patient was taking to the OR for I&D, wound VAC application, with purulent material drained and it was sent for cultures, patient was started on IV vancomycin, and cefepime by vascular surgery, and [facility] hospitalist consulted to admit.    Review of systems:    Patient in PACU, postanesthesia, still somnolent, cannot provide accurate review of system  With Past History of the following :    Past Medical History:  Diagnosis Date   Arthritis    "hands" (12/27/2012)   Chronic kidney disease    stage 3, in [place]   Depression 10/26/2022   in [place]   High cholesterol     Hypertension    Neuromuscular disorder (HCC)    feet neuropathy   Neuropathic pain    PAD (peripheral artery disease) (HCC)    Psoriasis    Stroke (HCC) 1999   "still have some speech problems at times; sometimes forget what I was going to say" (12/27/2012)   Type II diabetes mellitus (HCC)    Ulcer    Left ankle/leg   Walker as ambulation aid 12/02/2021   or wheelchair      Past Surgical History:  Procedure Laterality Date   ABDOMINAL AORTAGRAM N/A 03/30/2012   Procedure: ABDOMINAL Ronny Flurry;  Surgeon: Nada Libman, MD;  Location: [facility] CATH LAB;  Service: Cardiovascular;  Laterality: N/A;   ABDOMINAL AORTAGRAM N/A 12/27/2012   Procedure: ABDOMINAL Ronny Flurry;  Surgeon: Nada Libman, MD;  Location: [facility] CATH LAB;  Service: Cardiovascular;  Laterality: N/A;   ABDOMINAL AORTOGRAM W/LOWER EXTREMITY N/A 04/05/2017   Procedure: ABDOMINAL AORTOGRAM W/LOWER EXTREMITY;  Surgeon: Sherren Kerns, MD;  Location: [facility] INVASIVE CV LAB;  Service: Cardiovascular;  Laterality: N/A;   ABDOMINAL AORTOGRAM W/LOWER EXTREMITY N/A 09/30/2022   Procedure: ABDOMINAL AORTOGRAM W/LOWER EXTREMITY;  Surgeon: Victorino Sparrow, MD;  Location: [facility] INVASIVE CV LAB;  Service: Cardiovascular;  Laterality: N/A;   AMPUTATION Right 04/26/2017   Procedure: AMPUTATION TRANSMETATARSAL RIGHT GREAT TOE;  Surgeon: Larina Earthly, MD;  Location: [facility] OR;  Service: Vascular;  Laterality: Right;   AMPUTATION Left 12/11/2022   Procedure: AMPUTATION ABOVE KNEE;  Surgeon: Victorino Sparrow, MD;  Location: [facility] OR;  Service: Vascular;  Laterality: Left;   ANGIOPLASTY / STENTING FEMORAL Left 12/27/2012   APPLICATION OF WOUND VAC Right 04/26/2017   Procedure: APPLICATION OF WOUND VAC;  Surgeon: Larina Earthly, MD;  Location: [facility] OR;  Service: Vascular;  Laterality: Right;   FEMORAL ARTERY STENT  03/30/2012   LOWER EXTREMITY ANGIOGRAM  12/27/2012   Procedure: LOWER EXTREMITY ANGIOGRAM;  Surgeon: Nada Libman, MD;  Location: [facility] CATH LAB;  Service:  Cardiovascular;;   LOWER EXTREMITY ANGIOGRAPHY N/A 04/19/2018   Procedure: LOWER EXTREMITY ANGIOGRAPHY;  Surgeon: Nada Libman, MD;  Location: [facility] INVASIVE CV LAB;  Service: Cardiovascular;  Laterality: N/A;   PERIPHERAL VASCULAR ATHERECTOMY Right 04/05/2017   Procedure: PERIPHERAL VASCULAR ATHERECTOMY;  Surgeon: Sherren Kerns, MD;  Location: [facility] INVASIVE CV LAB;  Service: Cardiovascular;  Laterality: Right;  superficial femoral   PERIPHERAL VASCULAR BALLOON ANGIOPLASTY  04/19/2018   Procedure: PERIPHERAL VASCULAR BALLOON ANGIOPLASTY;  Surgeon: Nada Libman, MD;  Location: [facility] INVASIVE CV LAB;  Service: Cardiovascular;;   PERIPHERAL VASCULAR BALLOON ANGIOPLASTY Left 09/30/2022   Procedure: PERIPHERAL VASCULAR BALLOON ANGIOPLASTY;  Surgeon: Victorino Sparrow, MD;  Location: [facility] INVASIVE CV LAB;  Service: Cardiovascular;  Laterality: Left;  SFA, Pop, TP trunk & Rt Ext Iliac   PERIPHERAL VASCULAR INTERVENTION Right 04/05/2017   Procedure: PERIPHERAL VASCULAR INTERVENTION;  Surgeon: Sherren Kerns, MD;  Location: [facility] INVASIVE CV LAB;  Service: Cardiovascular;  Laterality: Right;   Superficial femorl and external iliac   POPLITEAL ARTERY STENT     TONSILLECTOMY AND ADENOIDECTOMY        Social History:     Social History   Tobacco Use   Smoking status: Former    Current packs/day: 0.00    Average packs/day: 3.0 packs/day for 45.0 years (135.0 ttl pk-yrs)    Types: Cigarettes    Start date: 01/06/1966    Quit date: 01/07/2011    Years since quitting: 12.0    Passive exposure: Never   Smokeless tobacco: Never  Substance Use Topics   Alcohol use: Not Currently    Comment: 12/27/2012 "couple times/yr I might have a drink"      Family History :     Family History  Problem Relation Age of Onset   Diabetes Sister        Bilateral amputation of lower legs   Diabetes Sister    Heart disease Sister 22       Heart disease before age 18   Hypertension Sister    Heart attack Sister     Hyperlipidemia Sister    Hypertension Father    Hyperlipidemia Father    Hyperlipidemia Mother    Hypertension Mother    Hypertension Daughter       Home Medications:   Prior to Admission medications   Medication Sig Start Date End Date Taking? Authorizing Provider  aspirin EC 81 MG tablet Take 81 mg by mouth daily. Swallow whole.  [provider]  atorvastatin (LIPITOR) 20 MG tablet Take 1 tablet (20 mg total) by mouth at bedtime. Too soon to refill 11/12/21   Love, Evlyn Kanner, PA-C  bisacodyl (DULCOLAX) 5 MG EC tablet Take 5 mg by mouth daily as needed for moderate constipation.    [provider]  clopidogrel (PLAVIX) 75 MG tablet Take 1 tablet (75 mg total) by mouth daily. 09/30/22 09/30/23  Victorino Sparrow, MD  Dulaglutide (TRULICITY) 1.5 MG/0.5ML SOAJ Inject 1.5 mg into the skin once a week. Takes weekly on Saturday    [provider]  DULoxetine (CYMBALTA) 30 MG capsule Take 30 mg by mouth daily.    [provider]  empagliflozin (JARDIANCE) 10 MG TABS tablet Take 1 tablet (10 mg total) by mouth daily. 02/26/22   Antoine Poche, MD  furosemide (LASIX) 20 MG tablet Take 1 tablet (20 mg total) by mouth daily. 12/18/22   Lorin Glass, MD  gabapentin (NEURONTIN) 300 MG capsule Take 600 mg by mouth 3 (three) times daily.    [provider]  insulin aspart (NOVOLOG) 100 UNIT/ML injection Inject 0-15 Units into the skin 3 (three) times daily with meals. 12/18/22   Dahal, Melina Schools, MD  insulin aspart (NOVOLOG) 100 UNIT/ML injection Inject 0-5 Units into the skin at bedtime. 12/18/22   Dahal, Melina Schools, MD  insulin detemir (LEVEMIR) 100 UNIT/ML injection Inject 21 Units into the skin at bedtime.    [provider]  Melatonin 3 MG CAPS Take 3 mg by mouth at bedtime as needed (sleep).    [provider]  Menthol-Zinc Oxide (CALMOSEPTINE EX) Apply 1 Application topically in the morning and at bedtime.    [provider]   metoprolol succinate (TOPROL-XL) 50 MG 24 hr tablet Take 50 mg by mouth daily. Take with or immediately following a meal.    [provider]  polyethylene glycol (MIRALAX / GLYCOLAX) 17 g packet Take 17 g by mouth daily as needed. 12/18/22   Lorin Glass, MD  Protein POWD Take 6 g by mouth in the morning, at noon, and at bedtime.    [provider]  senna-docusate (SENOKOT-S) 8.6-50 MG tablet Take 1 tablet by mouth daily. 12/18/22   Lorin Glass, MD     Allergies:     Allergies  Allergen Reactions   Oxycodone Nausea And Vomiting   Glipizide     Bad headache and blurred vision   Other     Soft silicone dressing caused wound to get worse   Bactrim [Sulfamethoxazole-Trimethoprim] Itching and Rash     Physical Exam:   Vitals  Blood pressure 119/61, pulse 90, temperature 99.6 F (37.6 C), resp. rate 20, height 5\' 8"  (1.727 m), weight 91.6 kg, SpO2 93%.   1. General Frail elderly male, laying in bed, no apparent distress  2.  In PACU, somnolent, wakes up answering questions to her name and location, but otherwise under some effective anesthesia   3. No F.N deficits, ALL C.Nerves Intact, Strength 5/5 all 4 extremities,   4. Ears and Eyes appear Normal, Conjunctivae clear, PERRLA. Moist Oral Mucosa.  5. Supple Neck, No JVD,  No Carotid Bruits.  6. Symmetrical Chest wall movement, Good air movement bilaterally, CTAB.  7. RRR, No Gallops, Rubs or Murmurs, No Parasternal Heave.  8. Positive Bowel Sounds, Abdomen Soft, has some suprapubic fullness, No organomegaly appriciated,No rebound -guarding or rigidity.  9.  No Cyanosis, Normal Skin Turgor,   10.  Left BKA, wound VAC with  good seal, please see picture below, right foot with gangrene in the third toe and great toe hold amputation, please see picture below       Data Review:    CBC Recent Labs  Lab 01/22/23 1300  WBC 12.0*  HGB 14.4  HCT 44.3  PLT 226  MCV 96.1  MCH 31.2  MCHC 32.5  RDW  14.5  LYMPHSABS 1.8  MONOABS 1.6*  EOSABS 0.2  BASOSABS 0.1   ------------------------------------------------------------------------------------------------------------------  Chemistries  Recent Labs  Lab 01/22/23 1300  NA 132*  K 4.4  CL 94*  CO2 23  GLUCOSE 130*  BUN 23  CREATININE 1.58*  CALCIUM 8.5*  AST 39  ALT 24  ALKPHOS 86  BILITOT 1.5*   ------------------------------------------------------------------------------------------------------------------ estimated creatinine clearance is 46.4 mL/min (A) (by C-G formula based on SCr of 1.58 mg/dL (H)). ------------------------------------------------------------------------------------------------------------------ No results for input(s): "TSH", "T4TOTAL", "T3FREE", "THYROIDAB" in the last 72 hours.  Invalid input(s): "FREET3"  Coagulation profile No results for input(s): "INR", "PROTIME" in the last 168 hours. ------------------------------------------------------------------------------------------------------------------- No results for input(s): "DDIMER" in the last 72 hours. -------------------------------------------------------------------------------------------------------------------  Cardiac Enzymes No results for input(s): "CKMB", "TROPONINI", "MYOGLOBIN" in the last 168 hours.  Invalid input(s): "CK" ------------------------------------------------------------------------------------------------------------------ No results found for: "BNP"   ---------------------------------------------------------------------------------------------------------------  Urinalysis    Component Value Date/Time   COLORURINE YELLOW 10/28/2021 2206   APPEARANCEUR CLEAR 10/28/2021 2206   LABSPEC 1.015 10/28/2021 2206   PHURINE 7.0 10/28/2021 2206   GLUCOSEU 50 (A) 10/28/2021 2206   HGBUR NEGATIVE 10/28/2021 2206   BILIRUBINUR NEGATIVE 10/28/2021 2206   KETONESUR 20 (A) 10/28/2021 2206   PROTEINUR NEGATIVE  10/28/2021 2206   NITRITE NEGATIVE 10/28/2021 2206   LEUKOCYTESUR NEGATIVE 10/28/2021 2206    ----------------------------------------------------------------------------------------------------------------   Imaging Results:    No results found.     Assessment & Plan:    Principal Problem:   Amputation stump infection (HCC) Active Problems:   Peripheral vascular disease, unspecified (HCC)   Chronic kidney disease   Type II diabetes mellitus with complication (HCC)   Neuropathic pain   S/P AKA (above knee amputation) unilateral (HCC)   Severe sepsis, present on admission, secondary to infected left stump wound Infected left stump wound -Sepsis present on admission, temperature 100, tachycardia, leukocytosis and elevated lactic acid. -This is secondary to infected left above-knee amputation incision, vascular surgery already took patient to the OR, status post I&D, and application of wound VAC. -Empirically on IV vancomycin and cefepime, follow on blood cultures, intraoperative cultures and adjust accordingly, with low threshold to change cefepime or narrow to avoid encephalopathy -As discussed with vascular surgery, patient would likely need to go back to the OR on Monday for further I&D and wound VAC change. -Continue with as needed pain med  PAD S/p Left AKA 11/15 -Patient on aspirin, Plavix and statin, as discussed with vascular surgery will hold Plavix in anticipation for further surgical procedures.  Chronic combined systolic and diastolic congestive heart failure Hypertension Last TTE May/2023-LVEF 30-40% , g3dd PTA meds- Toprol 50 mg daily, Lasix 20 mg twice daily, Jardiance 10 mg daily Continue with Toprol 50 mg oral daily -Due to sepsis will hold Lasix and Jardiance for now -Monitor volume status closely, will give gentle IV fluids in setting of sepsis   H/o stroke  right ICA stenosis HLD -Continue with aspirin and statin, hold Plavix in anticipation of  further procedures  Neuropathy -He is on Cymbalta and gabapentin, will put start date from tomorrow given he still somnolent  in PACU.   Type 2 diabetes mellitus uncontrolled A1c was 8.4 during recent admission Hold Jardiance and Trulicity Will keep an insulin sliding scale He is on Levemir 21 units at bedtime, I will resume at a lower dose, 10 units at bedtime as unclear if he will be awake enough to eat safely this evening  CKD 3B Renal function at baseline, continue to monitor closely   Obesity last 1 Body mass index is 30.71 kg/m.   DVT Prophylaxis  Lovenox  AM Labs Ordered, also please review Full Orders  Family Communication: Admission, patients condition and plan of care including tests being ordered have been discussed with the patient and daughter who indicate understanding and agree with the plan and Code Status.  Code Status full code  Likely DC to back to [facility] health [facility] rehab  Consults called: seen by vascular surgery  Admission status: Inpatient  Time spent in minutes : 70 minutes   Huey Bienenstock M.D on 01/22/2023 at 5:19 PM

## 2023-01-22 NOTE — Consult Note (Addendum)
  Consult Note    01/22/2023 1:28 PM   Subjective:  denies fever or chills.  Does have drainage    Vitals:   01/22/23 1116 01/22/23 1205  BP: 121/82 136/68  Pulse: 66 90  Resp: 18   Temp: 97.8 F (36.6 C)   SpO2: 99% 95%    Physical Exam: General:  no distress Lungs:  non labored Incisions:  left BKA with drainage from medial and lateral aspect of stump.     Extremities:  right foot with toe ulceration x 2    CBC    Component Value Date/Time   WBC 12.0 (H) 01/22/2023 1300   RBC 4.61 01/22/2023 1300   HGB 14.4 01/22/2023 1300   HCT 44.3 01/22/2023 1300   PLT 226 01/22/2023 1300   MCV 96.1 01/22/2023 1300   MCH 31.2 01/22/2023 1300   MCHC 32.5 01/22/2023 1300   RDW 14.5 01/22/2023 1300   LYMPHSABS 1.8 01/22/2023 1300   MONOABS 1.6 (H) 01/22/2023 1300   EOSABS 0.2 01/22/2023 1300   BASOSABS 0.1 01/22/2023 1300    BMET    Component Value Date/Time   NA 138 12/18/2022 0656   K 5.6 (H) 12/18/2022 0656   CL 106 12/18/2022 0656   CO2 28 12/18/2022 0656   GLUCOSE 102 (H) 12/18/2022 0656   BUN 16 12/18/2022 0656   CREATININE 1.17 12/18/2022 0656   CALCIUM 8.4 (L) 12/18/2022 0656   GFRNONAA >60 12/18/2022 0656   GFRAA 47 (L) 04/28/2017 0238    INR    Component Value Date/Time   INR 1.2 12/11/2022 1105    No intake or output data in the 24 hours ending 01/22/23 1328    Assessment/Plan:  72 y.o. male is s/p: left AKA 12/11/2022 by Dr. Karin Lieu. Pt has had history of multiple interventions to bilateral lower extremities and nonhealing left first toe ray amputation.  He did not have any revascularization options and underwent amputation.  He returns to the hospital today with drainage from the medial and lateral aspect of the stump and appears infected. He has a mild leukocytosis of 12k and mildly elevated lactic acid.      -pt seen with Dr. Karin Lieu and he attempted to debride at bedside.  Pt did not tolerate well.  Will plan for OR today for I&D to  clean up wound with probable wound vac placement vs wet to dry.  -npo as we will proceed to OR today.  -will need medical admission    Doreatha Massed, PA-C [facility] [phone] 01/22/2023 1:28 PM  VASCULAR STAFF ADDENDUM: I have independently interviewed and examined the patient. I agree with the above.   Rande Brunt. Lenell Antu, MD [facility] Phone Number: [phone] 01/22/2023 2:36 PM

## 2023-01-22 NOTE — ED Triage Notes (Signed)
Pt here from nursing home with c/o possible poet op wound infection to his left stump, had aka in November her at [facility]

## 2023-01-22 NOTE — Progress Notes (Addendum)
Pharmacy Antibiotic Note  James Dougherty is a 72 y.o. male admitted on 01/22/2023 with amputation site infection s/p I*D in OR 12/27.  Pharmacy has been consulted for vancomycin and piperacillin/tazobactam  dosing.  Discussed cefepime instead of piperacillin/tazobactam given source control and AKI risk, team agreed. ClCr 30ml/min.   12/27 Vancomycin 1250mg  Q 24 hr with Est AUC: 494 Scr used: 1.58 mg/dL; Vd coeff: 0.72 L/kg (BMI 30)  Plan: Vancomycin 1500mg  load then 1250 q24hr Cefepime 2g q12hr Monitor cultures, clinical status, renal function, vancomycin level Narrow abx as able and f/u duration    Height: 5\' 8"  (172.7 cm) Weight: 91.6 kg (202 lb) IBW/kg (Calculated) : 68.4  Temp (24hrs), Avg:98.8 F (37.1 C), Min:97.8 F (36.6 C), Max:99.7 F (37.6 C)  Recent Labs  Lab 01/22/23 1300 01/22/23 1324  WBC 12.0*  --   CREATININE 1.58*  --   LATICACIDVEN  --  2.7*    Estimated Creatinine Clearance: 46.4 mL/min (A) (by C-G formula based on SCr of 1.58 mg/dL (H)).    Allergies  Allergen Reactions   Oxycodone Nausea And Vomiting   Glipizide     Bad headache and blurred vision   Other     Soft silicone dressing caused wound to get worse   Bactrim [Sulfamethoxazole-Trimethoprim] Itching and Rash    Antimicrobials this admission: Cefe 12/27 >>  vanc 12/27 >>   Dose adjustments this admission: N/a  Microbiology results: 12/27 BCx:  12/27 OR cx:  12/27 MRSA:   Thank you for allowing pharmacy to be a part of this patient's care.  Alphia Moh, PharmD, BCPS, BCCP Clinical Pharmacist  Please check AMION for all [facility] phone numbers After 10:00 PM, call Main Pharmacy [phone]

## 2023-01-22 NOTE — Anesthesia Procedure Notes (Signed)
Procedure Name: Intubation Date/Time: 01/22/2023 3:35 PM  Performed by: Sandie Ano, CRNAPre-anesthesia Checklist: Patient identified, Emergency Drugs available, Suction available and Patient being monitored Patient Re-evaluated:Patient Re-evaluated prior to induction Oxygen Delivery Method: Circle System Utilized Preoxygenation: Pre-oxygenation with 100% oxygen Induction Type: IV induction Ventilation: Mask ventilation without difficulty Laryngoscope Size: Mac and 3 Grade View: Grade I Tube type: Oral Tube size: 7.5 mm Number of attempts: 1 Airway Equipment and Method: Stylet and Oral airway Placement Confirmation: ETT inserted through vocal cords under direct vision, positive ETCO2 and breath sounds checked- equal and bilateral Secured at: 22 cm Tube secured with: Tape Dental Injury: Teeth and Oropharynx as per pre-operative assessment

## 2023-01-22 NOTE — ED Provider Notes (Cosign Needed)
[facility] Provider Note   CSN: [id number] Arrival date & time: 01/22/23  1108     History  Chief Complaint  Patient presents with   Leg Pain    James Dougherty is a 72 y.o. male.  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 States that his wound is draining purulent fluid and he is having pain in his surgical site. Denies worsening pain today. No fevers or chills.   The history is provided by the patient. No language interpreter was used.  Leg Pain      Home Medications Prior to Admission medications   Medication Sig Start Date End Date Taking? Authorizing Provider  aspirin EC 81 MG tablet Take 81 mg by mouth daily. Swallow whole.    [provider]  atorvastatin (LIPITOR) 20 MG tablet Take 1 tablet (20 mg total) by mouth at bedtime. Too soon to refill 11/12/21   Love, Evlyn Kanner, PA-C  bisacodyl (DULCOLAX) 5 MG EC tablet Take 5 mg by mouth daily as needed for moderate constipation.    [provider]  clopidogrel (PLAVIX) 75 MG tablet Take 1 tablet (75 mg total) by mouth daily. 09/30/22 09/30/23  Victorino Sparrow, MD  Dulaglutide (TRULICITY) 1.5 MG/0.5ML SOAJ Inject 1.5 mg into the skin once a week. Takes weekly on Saturday    [provider]  DULoxetine (CYMBALTA) 30 MG capsule Take 30 mg by mouth daily.    [provider]  empagliflozin (JARDIANCE) 10 MG TABS tablet Take 1 tablet (10 mg total) by mouth daily. 02/26/22   Antoine Poche, MD  furosemide (LASIX) 20 MG tablet Take 1 tablet (20 mg total) by mouth daily. 12/18/22   Lorin Glass, MD  gabapentin (NEURONTIN) 300 MG capsule Take 600 mg by mouth 3 (three) times daily.    [provider]  insulin aspart (NOVOLOG) 100 UNIT/ML injection Inject 0-15 Units into the skin 3 (three) times daily with meals. 12/18/22   Dahal, Melina Schools, MD  insulin aspart (NOVOLOG) 100 UNIT/ML injection  Inject 0-5 Units into the skin at bedtime. 12/18/22   Dahal, Melina Schools, MD  insulin detemir (LEVEMIR) 100 UNIT/ML injection Inject 21 Units into the skin at bedtime.    [provider]  Melatonin 3 MG CAPS Take 3 mg by mouth at bedtime as needed (sleep).    [provider]  Menthol-Zinc Oxide (CALMOSEPTINE EX) Apply 1 Application topically in the morning and at bedtime.    [provider]  metoprolol succinate (TOPROL-XL) 50 MG 24 hr tablet Take 50 mg by mouth daily. Take with or immediately following a meal.    [provider]  polyethylene glycol (MIRALAX / GLYCOLAX) 17 g packet Take 17 g by mouth daily as needed. 12/18/22   Lorin Glass, MD  Protein POWD Take 6 g by mouth in the morning, at noon, and at bedtime.    [provider]  senna-docusate (SENOKOT-S) 8.6-50 MG tablet Take 1 tablet by mouth daily. 12/18/22   Lorin Glass, MD      Allergies    Oxycodone, Glipizide, Other, and Bactrim [sulfamethoxazole-trimethoprim]    Review of Systems   Review of Systems  Skin:  Positive for wound.  All other systems reviewed and are negative.   Physical Exam Updated Vital Signs BP (!) 140/70   Pulse 92   Temp 99.7 F (37.6 C) (Oral)   Resp 18  Ht 5\' 8"  (1.727 m)   Wt 91.6 kg   SpO2 96%   BMI 30.71 kg/m  Physical Exam Vitals and nursing note reviewed.  Constitutional:      General: He is not in acute distress.    Appearance: Normal appearance. He is normal weight. He is not ill-appearing, toxic-appearing or diaphoretic.  HENT:     Head: Normocephalic and atraumatic.  Cardiovascular:     Rate and Rhythm: Normal rate.  Pulmonary:     Effort: Pulmonary effort is normal. No respiratory distress.  Musculoskeletal:        General: Normal range of motion.     Cervical back: Normal range of motion.     Comments: Patient with left sided AKA with wound that is draining purulence with erythema and warmth. Tenderness noted as well. See images  below for further  Skin:    General: Skin is warm and dry.  Neurological:     General: No focal deficit present.     Mental Status: He is alert.  Psychiatric:        Mood and Affect: Mood normal.        Behavior: Behavior normal.     ED Results / Procedures / Treatments   Labs (all labs ordered are listed, but only abnormal results are displayed) Labs Reviewed  COMPREHENSIVE METABOLIC PANEL - Abnormal; Notable for the following components:      Result Value   Sodium 132 (*)    Chloride 94 (*)    Glucose, Bld 130 (*)    Creatinine, Ser 1.58 (*)    Calcium 8.5 (*)    Albumin 2.7 (*)    Total Bilirubin 1.5 (*)    GFR, Estimated 46 (*)    All other components within normal limits  CBC WITH DIFFERENTIAL/PLATELET - Abnormal; Notable for the following components:   WBC 12.0 (*)    Neutro Abs 8.3 (*)    Monocytes Absolute 1.6 (*)    All other components within normal limits  GLUCOSE, CAPILLARY - Abnormal; Notable for the following components:   Glucose-Capillary 141 (*)    All other components within normal limits  I-STAT CG4 LACTIC ACID, ED - Abnormal; Notable for the following components:   Lactic Acid, Venous 2.7 (*)    All other components within normal limits  CULTURE, BLOOD (ROUTINE X 2)  CULTURE, BLOOD (ROUTINE X 2)  SURGICAL PCR SCREEN    EKG None  Radiology No results found.  Procedures .Critical Care  Performed by: Silva Bandy, PA-C Authorized by: Silva Bandy, PA-C   Critical care provider statement:    Critical care time (minutes):  35   Critical care was necessary to treat or prevent imminent or life-threatening deterioration of the following conditions:  Sepsis   Critical care was time spent personally by me on the following activities:  Development of treatment plan with patient or surrogate, discussions with consultants, discussions with primary provider, evaluation of patient's response to treatment, examination of patient, obtaining history from  patient or surrogate, ordering and review of laboratory studies, ordering and review of radiographic studies, pulse oximetry, re-evaluation of patient's condition and review of old charts   Care discussed with: admitting provider       Medications Ordered in ED Medications  lactated ringers infusion (has no administration in time range)  chlorhexidine (PERIDEX) 0.12 % solution 15 mL (15 mLs Mouth/Throat Given 01/22/23 1432)    Or  Oral care mouth rinse ( Mouth Rinse See  Alternative 01/22/23 1432)    ED Course/ Medical Decision Making/ A&P                                 Medical Decision Making Amount and/or Complexity of Data Reviewed Labs: ordered.   This patient is a 72 y.o. male who presents to the ED for concern of post op problem, this involves an extensive number of treatment options, and is a complaint that carries with it a high risk of complications and morbidity. The emergent differential diagnosis prior to evaluation includes, but is not limited to,  sepsis, cellulitis, abscess, seroma, osteomyelitis . This is not an exhaustive differential.   Past Medical History / Co-morbidities / Social History:  has a past medical history of Arthritis, Chronic kidney disease, Depression (10/26/2022), High cholesterol, Hypertension, Neuromuscular disorder (HCC), Neuropathic pain, PAD (peripheral artery disease) (HCC), Psoriasis, Stroke (HCC) (1999), Type II diabetes mellitus (HCC), Ulcer, and Walker as ambulation aid (12/02/2021).  Additional history: Chart reviewed. Pertinent results include: AKA performed by vascular surgery Dr. Karin Lieu on 12/03/22  Physical Exam: Physical exam performed. The pertinent findings include: per above, stump of patients AKA with erythema, warmth, open wound draining purulence  Lab Tests: I ordered, and personally interpreted labs.  The pertinent results include:  WBC 12, Na 132, chloride 94, creatinine 1.58 (up from 1.17 1 month ago). Lactic 2.7. blood  cultures pending   Medications: I ordered medication including vanc and cefepime for sepsis/wound infection. Reevaluation of the patient after these medicines showed that the patient stayed the same. I have reviewed the patients home medicines and have made adjustments as needed.  Consultations Obtained: I requested consultation with the vascular surgery Dr. Karin Lieu,  and discussed lab and imaging findings as well as pertinent plan - they recommend: they will take the patient to the OR today, will need hospitalist admission   Disposition: After consideration of the diagnostic results and the patients response to treatment, I feel that patient will require admission for sepsis from patients wound.  Discussed patient with hospitalist Dr. Artis Flock, however patient already in the OR at time of consult. She requests hospitalist be repaged for admission when patient is out of the OR.     I discussed this case with my attending physician Dr. Donnald Garre who cosigned this note including patient's presenting symptoms, physical exam, and planned diagnostics and interventions. Attending physician stated agreement with plan or made changes to plan which were implemented.    Final Clinical Impression(s) / ED Diagnoses Final diagnoses:  Sepsis, due to unspecified organism, unspecified whether acute organ dysfunction present (HCC)  Sepsis following procedure, initial encounter [facility])    Rx / DC Orders ED Discharge Orders     None         Silva Bandy, PA-C 01/22/23 1628

## 2023-01-22 NOTE — Transfer of Care (Signed)
Immediate Anesthesia Transfer of Care Note  Patient: James Dougherty  Procedure(s) Performed: ABOVE KNEE AMPUTATION WASHOUT (Left: Knee) APPLICATION OF WOUND VAC (Left: Knee)  Patient Location: PACU  Anesthesia Type:General  Level of Consciousness: awake, alert , and oriented  Airway & Oxygen Therapy: Patient Spontanous Breathing  Post-op Assessment: Report given to RN and Post -op Vital signs reviewed and stable  Post vital signs: Reviewed and stable  Last Vitals:  Vitals Value Taken Time  BP 128/87 01/22/23 1616  Temp    Pulse 119 01/22/23 1618  Resp 24 01/22/23 1618  SpO2 100 % 01/22/23 1618  Vitals shown include unfiled device data.  Last Pain:  Vitals:   01/22/23 1426  TempSrc:   PainSc: 0-No pain         Complications: No notable events documented.

## 2023-01-22 NOTE — Progress Notes (Signed)
Pt. Arrived to unit alert to voice no c/o pain, wound vac in place. Bed in lowest position call bell within reach. Daughter notified via phone call

## 2023-01-22 NOTE — ED Triage Notes (Signed)
Pt does have drainage coming from stump

## 2023-01-22 NOTE — Op Note (Signed)
DATE OF SERVICE: 01/22/2023  PATIENT:  James Dougherty  72 y.o. male  PRE-OPERATIVE DIAGNOSIS:  infected left above knee amputation incision  POST-OPERATIVE DIAGNOSIS:  Same  PROCEDURE:   incision and drainage of left above knee amputation (CPT 27301) application of negative pressure wound therapy (21 x 3 x 5cm total volume) (CPT 95621)  SURGEON:  Surgeons and Role:    * Leonie Douglas, MD - Primary  ASSISTANT: Nathanial Rancher, PA-C  An experienced assistant was required given the complexity of this procedure and the standard of surgical care. My assistant helped with exposure through counter tension, suctioning, ligation and retraction to better visualize the surgical field.  My assistant expedited sewing during the case by following my sutures. Wherever I use the term "we" in the report, my assistant actively helped me with that portion of the procedure.  ANESTHESIA:   general  EBL: minimal  BLOOD ADMINISTERED:none  DRAINS: none   LOCAL MEDICATIONS USED:  NONE  SPECIMEN:  culture / gram stain from stump infection  COUNTS: confirmed correct.  TOURNIQUET:  none  PATIENT DISPOSITION:  PACU - hemodynamically stable.   Delay start of Pharmacological VTE agent (>24hrs) due to surgical blood loss or risk of bleeding: no  INDICATION FOR PROCEDURE: James Dougherty is a 72 y.o. male with left below knee amputation stump infection. After careful discussion of risks, benefits, and alternatives the patient was offered incision and drainage. The patient understood and wished to proceed.  OPERATIVE FINDINGS: deep infection. Successful I&D. Wound measured 21 x 5 x 3cm.   DESCRIPTION OF PROCEDURE: After identification of the patient in the pre-operative holding area, the patient was transferred to the operating room. The patient was positioned supine on the operating room table. Anesthesia was induced. The left leg was prepped and draped in standard fashion. A surgical pause was performed  confirming correct patient, procedure, and operative location.  The left above knee amputation wound was reopened bluntly. A fluid collection was noted deep in the wound adjacent to the bone. The bone did not appear infected or compromised when probed. The wound was copiously irrigated. The wound measured 21 x 5 x 3cm. A black sponge was applied and secured with ioban dressing.  Upon completion of the case instrument and sharps counts were confirmed correct. The patient was transferred to the PACU in good condition. I was present for all portions of the procedure.  Rande Brunt. Lenell Antu, MD [facility] Phone Number: [phone] 01/22/2023 4:05 PM

## 2023-01-22 NOTE — Progress Notes (Signed)
This nurse was notified by micro lab of MRSA in [place]

## 2023-01-22 NOTE — Telephone Encounter (Signed)
Caller: Heather in transportation, [place], Charity fundraiser at [employer] [facility]  Concern: Fever of 100', lots of drainage from stump, pt already finished ABX  Location: left leg  Description:  continuing to worsen  Procedure: Amputation L AKA  Resolution: Instructed patient to proceed to nearest ER

## 2023-01-22 NOTE — H&P (Incomplete)
TRH H&P   Patient Demographics:    James Dougherty, is a 72 y.o. male  MRN: [id number]   DOB - 1950-09-16  Admit Date - 01/22/2023  Outpatient Primary MD for the patient is Sasser, Clarene Critchley, MD  Referring MD/NP/PA: ***  Outpatient Specialists: ***    Patient coming from: ***  Chief Complaint  Patient presents with   Leg Pain      HPI:    James Dougherty  is a 72 y.o. male, ******    Review of systems:    In addition to the HPI above, **** No Fever-chills, No Headache, No changes with Vision or hearing, No problems swallowing food or Liquids, No Chest pain, Cough or Shortness of Breath, No Abdominal pain, No Nausea or Vommitting, Bowel movements are regular, No Blood in stool or Urine, No dysuria, No new skin rashes or bruises, No new joints pains-aches,  No new weakness, tingling, numbness in any extremity, No recent weight gain or loss, No polyuria, polydypsia or polyphagia, No significant Mental Stressors.  A full 10 point Review of Systems was done, except as stated above, all other Review of Systems were negative.   With Past History of the following :    Past Medical History:  Diagnosis Date   Arthritis    "hands" (12/27/2012)   Chronic kidney disease    stage 3, in [place]   Depression 10/26/2022   in [place]   High cholesterol    Hypertension    Neuromuscular disorder (HCC)    feet neuropathy   Neuropathic pain    PAD (peripheral artery disease) (HCC)    Psoriasis    Stroke (HCC) 1999   "still have some speech problems at times; sometimes forget what I was going to say" (12/27/2012)   Type II diabetes mellitus (HCC)    Ulcer    Left ankle/leg   Walker as ambulation aid 12/02/2021   or wheelchair      Past Surgical History:  Procedure Laterality Date   ABDOMINAL AORTAGRAM N/A 03/30/2012   Procedure: ABDOMINAL Ronny Flurry;  Surgeon: Nada Libman, MD;   Location: [facility] CATH LAB;  Service: Cardiovascular;  Laterality: N/A;   ABDOMINAL AORTAGRAM N/A 12/27/2012   Procedure: ABDOMINAL Ronny Flurry;  Surgeon: Nada Libman, MD;  Location: [facility] CATH LAB;  Service: Cardiovascular;  Laterality: N/A;   ABDOMINAL AORTOGRAM W/LOWER EXTREMITY N/A 04/05/2017   Procedure: ABDOMINAL AORTOGRAM W/LOWER EXTREMITY;  Surgeon: Sherren Kerns, MD;  Location: [facility] INVASIVE CV LAB;  Service: Cardiovascular;  Laterality: N/A;   ABDOMINAL AORTOGRAM W/LOWER EXTREMITY N/A 09/30/2022   Procedure: ABDOMINAL AORTOGRAM W/LOWER EXTREMITY;  Surgeon: Victorino Sparrow, MD;  Location: [facility] INVASIVE CV LAB;  Service: Cardiovascular;  Laterality: N/A;   AMPUTATION Right 04/26/2017   Procedure: AMPUTATION TRANSMETATARSAL RIGHT GREAT TOE;  Surgeon: Larina Earthly, MD;  Location: [facility] OR;  Service: Vascular;  Laterality: Right;  AMPUTATION Left 12/11/2022   Procedure: AMPUTATION ABOVE KNEE;  Surgeon: Victorino Sparrow, MD;  Location: [facility] OR;  Service: Vascular;  Laterality: Left;   ANGIOPLASTY / STENTING FEMORAL Left 12/27/2012   APPLICATION OF WOUND VAC Right 04/26/2017   Procedure: APPLICATION OF WOUND VAC;  Surgeon: Larina Earthly, MD;  Location: [facility] OR;  Service: Vascular;  Laterality: Right;   FEMORAL ARTERY STENT  03/30/2012   LOWER EXTREMITY ANGIOGRAM  12/27/2012   Procedure: LOWER EXTREMITY ANGIOGRAM;  Surgeon: Nada Libman, MD;  Location: [facility] CATH LAB;  Service: Cardiovascular;;   LOWER EXTREMITY ANGIOGRAPHY N/A 04/19/2018   Procedure: LOWER EXTREMITY ANGIOGRAPHY;  Surgeon: Nada Libman, MD;  Location: [facility] INVASIVE CV LAB;  Service: Cardiovascular;  Laterality: N/A;   PERIPHERAL VASCULAR ATHERECTOMY Right 04/05/2017   Procedure: PERIPHERAL VASCULAR ATHERECTOMY;  Surgeon: Sherren Kerns, MD;  Location: [facility] INVASIVE CV LAB;  Service: Cardiovascular;  Laterality: Right;  superficial femoral   PERIPHERAL VASCULAR BALLOON ANGIOPLASTY  04/19/2018   Procedure: PERIPHERAL VASCULAR BALLOON ANGIOPLASTY;   Surgeon: Nada Libman, MD;  Location: [facility] INVASIVE CV LAB;  Service: Cardiovascular;;   PERIPHERAL VASCULAR BALLOON ANGIOPLASTY Left 09/30/2022   Procedure: PERIPHERAL VASCULAR BALLOON ANGIOPLASTY;  Surgeon: Victorino Sparrow, MD;  Location: [facility] INVASIVE CV LAB;  Service: Cardiovascular;  Laterality: Left;  SFA, Pop, TP trunk & Rt Ext Iliac   PERIPHERAL VASCULAR INTERVENTION Right 04/05/2017   Procedure: PERIPHERAL VASCULAR INTERVENTION;  Surgeon: Sherren Kerns, MD;  Location: [facility] INVASIVE CV LAB;  Service: Cardiovascular;  Laterality: Right;   Superficial femorl and external iliac   POPLITEAL ARTERY STENT     TONSILLECTOMY AND ADENOIDECTOMY        Social History:     Social History   Tobacco Use   Smoking status: Former    Current packs/day: 0.00    Average packs/day: 3.0 packs/day for 45.0 years (135.0 ttl pk-yrs)    Types: Cigarettes    Start date: 01/06/1966    Quit date: 01/07/2011    Years since quitting: 12.0    Passive exposure: Never   Smokeless tobacco: Never  Substance Use Topics   Alcohol use: Not Currently    Comment: 12/27/2012 "couple times/yr I might have a drink"     Lives -   Mobility -   ********   Family History :     Family History  Problem Relation Age of Onset   Diabetes Sister        Bilateral amputation of lower legs   Diabetes Sister    Heart disease Sister 30       Heart disease before age 77   Hypertension Sister    Heart attack Sister    Hyperlipidemia Sister    Hypertension Father    Hyperlipidemia Father    Hyperlipidemia Mother    Hypertension Mother    Hypertension Daughter    ********   Home Medications:   Prior to Admission medications   Medication Sig Start Date End Date Taking? Authorizing Provider  aspirin EC 81 MG tablet Take 81 mg by mouth daily. Swallow whole.    [provider]  atorvastatin (LIPITOR) 20 MG tablet Take 1 tablet (20 mg total) by mouth at bedtime. Too soon to refill 11/12/21   Love, Evlyn Kanner, PA-C  bisacodyl (DULCOLAX) 5 MG EC tablet Take 5 mg by mouth daily as needed for moderate constipation.    [provider]  clopidogrel (PLAVIX) 75 MG tablet Take 1 tablet (  75 mg total) by mouth daily. 09/30/22 09/30/23  Victorino Sparrow, MD  Dulaglutide (TRULICITY) 1.5 MG/0.5ML SOAJ Inject 1.5 mg into the skin once a week. Takes weekly on Saturday    [provider]  DULoxetine (CYMBALTA) 30 MG capsule Take 30 mg by mouth daily.    [provider]  empagliflozin (JARDIANCE) 10 MG TABS tablet Take 1 tablet (10 mg total) by mouth daily. 02/26/22   Antoine Poche, MD  furosemide (LASIX) 20 MG tablet Take 1 tablet (20 mg total) by mouth daily. 12/18/22   Lorin Glass, MD  gabapentin (NEURONTIN) 300 MG capsule Take 600 mg by mouth 3 (three) times daily.    [provider]  insulin aspart (NOVOLOG) 100 UNIT/ML injection Inject 0-15 Units into the skin 3 (three) times daily with meals. 12/18/22   Dahal, Melina Schools, MD  insulin aspart (NOVOLOG) 100 UNIT/ML injection Inject 0-5 Units into the skin at bedtime. 12/18/22   Dahal, Melina Schools, MD  insulin detemir (LEVEMIR) 100 UNIT/ML injection Inject 21 Units into the skin at bedtime.    [provider]  Melatonin 3 MG CAPS Take 3 mg by mouth at bedtime as needed (sleep).    [provider]  Menthol-Zinc Oxide (CALMOSEPTINE EX) Apply 1 Application topically in the morning and at bedtime.    [provider]  metoprolol succinate (TOPROL-XL) 50 MG 24 hr tablet Take 50 mg by mouth daily. Take with or immediately following a meal.    [provider]  polyethylene glycol (MIRALAX / GLYCOLAX) 17 g packet Take 17 g by mouth daily as needed. 12/18/22   Lorin Glass, MD  Protein POWD Take 6 g by mouth in the morning, at noon, and at bedtime.    [provider]  senna-docusate (SENOKOT-S) 8.6-50 MG tablet Take 1 tablet by mouth daily. 12/18/22   Lorin Glass, MD     Allergies:     Allergies   Allergen Reactions   Oxycodone Nausea And Vomiting   Glipizide     Bad headache and blurred vision   Other     Soft silicone dressing caused wound to get worse   Bactrim [Sulfamethoxazole-Trimethoprim] Itching and Rash     Physical Exam:   Vitals  Blood pressure 126/84, pulse 94, temperature 99.6 F (37.6 C), resp. rate (!) 22, height 5\' 8"  (1.727 m), weight 91.6 kg, SpO2 92%.   1. General ******* lying in bed in NAD,  ***********  2. Normal affect and insight, Not Suicidal or Homicidal, Awake Alert, Oriented X 3.  3. No F.N deficits, ALL C.Nerves Intact, Strength 5/5 all 4 extremities, Sensation intact all 4 extremities, Plantars down going.  4. Ears and Eyes appear Normal, Conjunctivae clear, PERRLA. Moist Oral Mucosa.  5. Supple Neck, No JVD, No cervical lymphadenopathy appriciated, No Carotid Bruits.  6. Symmetrical Chest wall movement, Good air movement bilaterally, CTAB.  7. RRR, No Gallops, Rubs or Murmurs, No Parasternal Heave.  8. Positive Bowel Sounds, Abdomen Soft, No tenderness, No organomegaly appriciated,No rebound -guarding or rigidity.  9.  No Cyanosis, Normal Skin Turgor, No Skin Rash or Bruise.  10. Good muscle tone,  joints appear normal , no effusions, Normal ROM.  11. No Palpable Lymph Nodes in Neck or Axillae  **************   Data Review:    CBC Recent Labs  Lab 01/22/23 1300  WBC 12.0*  HGB 14.4  HCT 44.3  PLT 226  MCV 96.1  MCH 31.2  MCHC 32.5  RDW 14.5  LYMPHSABS  1.8  MONOABS 1.6*  EOSABS 0.2  BASOSABS 0.1   ------------------------------------------------------------------------------------------------------------------  Chemistries  Recent Labs  Lab 01/22/23 1300  NA 132*  K 4.4  CL 94*  CO2 23  GLUCOSE 130*  BUN 23  CREATININE 1.58*  CALCIUM 8.5*  AST 39  ALT 24  ALKPHOS 86  BILITOT 1.5*    ------------------------------------------------------------------------------------------------------------------ estimated creatinine clearance is 46.4 mL/min (A) (by C-G formula based on SCr of 1.58 mg/dL (H)). ------------------------------------------------------------------------------------------------------------------ No results for input(s): "TSH", "T4TOTAL", "T3FREE", "THYROIDAB" in the last 72 hours.  Invalid input(s): "FREET3"  Coagulation profile No results for input(s): "INR", "PROTIME" in the last 168 hours. ------------------------------------------------------------------------------------------------------------------- No results for input(s): "DDIMER" in the last 72 hours. -------------------------------------------------------------------------------------------------------------------  Cardiac Enzymes No results for input(s): "CKMB", "TROPONINI", "MYOGLOBIN" in the last 168 hours.  Invalid input(s): "CK" ------------------------------------------------------------------------------------------------------------------ No results found for: "BNP"   ---------------------------------------------------------------------------------------------------------------  Urinalysis    Component Value Date/Time   COLORURINE YELLOW 10/28/2021 2206   APPEARANCEUR CLEAR 10/28/2021 2206   LABSPEC 1.015 10/28/2021 2206   PHURINE 7.0 10/28/2021 2206   GLUCOSEU 50 (A) 10/28/2021 2206   HGBUR NEGATIVE 10/28/2021 2206   BILIRUBINUR NEGATIVE 10/28/2021 2206   KETONESUR 20 (A) 10/28/2021 2206   PROTEINUR NEGATIVE 10/28/2021 2206   NITRITE NEGATIVE 10/28/2021 2206   LEUKOCYTESUR NEGATIVE 10/28/2021 2206    ----------------------------------------------------------------------------------------------------------------   Imaging Results:    No results found.  My personal review of EKG: Rhythm NSR, Rate  ** /min, QTc *** , no Acute ST changes   Assessment & Plan:     Principal Problem:   Amputation stump infection (HCC) Active Problems:   Peripheral vascular disease, unspecified (HCC)   Chronic kidney disease   Type II diabetes mellitus with complication (HCC)   Neuropathic pain   S/P AKA (above knee amputation) unilateral (HCC)    1.    DVT Prophylaxis Heparin -  Lovenox - SCDs ***  AM Labs Ordered, also please review Full Orders  Family Communication: Admission, patients condition and plan of care including tests being ordered have been discussed with the patient and **** who indicate understanding and agree with the plan and Code Status.  Code Status ***  Likely DC to  ***  Condition GUARDED  *******  Consults called: ***    Admission status: ***    Time spent in minutes : Huey Bienenstock M.D on 01/22/2023 at 4:41 PM   [facility] - Office  [phone]

## 2023-01-23 ENCOUNTER — Other Ambulatory Visit: Payer: Self-pay

## 2023-01-23 DIAGNOSIS — T8740 Infection of amputation stump, unspecified extremity: Secondary | ICD-10-CM | POA: Diagnosis not present

## 2023-01-23 LAB — GLUCOSE, CAPILLARY
Glucose-Capillary: 175 mg/dL — ABNORMAL HIGH (ref 70–99)
Glucose-Capillary: 144 mg/dL — ABNORMAL HIGH (ref 70–99)
Glucose-Capillary: 155 mg/dL — ABNORMAL HIGH (ref 70–99)
Glucose-Capillary: 149 mg/dL — ABNORMAL HIGH (ref 70–99)
Glucose-Capillary: 146 mg/dL — ABNORMAL HIGH (ref 70–99)
Glucose-Capillary: 136 mg/dL — ABNORMAL HIGH (ref 70–99)

## 2023-01-23 LAB — BASIC METABOLIC PANEL
Anion gap: 13 (ref 5–15)
BUN: 27 mg/dL — ABNORMAL HIGH (ref 8–23)
CO2: 22 mmol/L (ref 22–32)
Calcium: 8.2 mg/dL — ABNORMAL LOW (ref 8.9–10.3)
Chloride: 102 mmol/L (ref 98–111)
Creatinine, Ser: 1.51 mg/dL — ABNORMAL HIGH (ref 0.61–1.24)
GFR, Estimated: 49 mL/min — ABNORMAL LOW (ref >60)
Glucose, Bld: 177 mg/dL — ABNORMAL HIGH (ref 70–99)
Potassium: 4.4 mmol/L (ref 3.5–5.1)
Sodium: 137 mmol/L (ref 135–145)

## 2023-01-23 LAB — CBC
HCT: 42.1 % (ref 39.0–52.0)
Hemoglobin: 13.5 g/dL (ref 13.0–17.0)
MCH: 30.8 pg (ref 26.0–34.0)
MCHC: 32.1 g/dL (ref 30.0–36.0)
MCV: 96.1 fL (ref 80.0–100.0)
Platelets: 198 10*3/uL (ref 150–400)
RBC: 4.38 MIL/uL (ref 4.22–5.81)
RDW: 14.3 % (ref 11.5–15.5)
WBC: 12.3 10*3/uL — ABNORMAL HIGH (ref 4.0–10.5)
nRBC: 0 % (ref 0.0–0.2)

## 2023-01-23 MED ORDER — ONDANSETRON HCL 4 MG/2ML IJ SOLN
4.0000 mg | Freq: Four times a day (QID) | INTRAMUSCULAR | Status: DC | PRN
  Administered 2023-01-23: 4 mg via INTRAVENOUS

## 2023-01-23 MED ORDER — ONDANSETRON HCL 4 MG/2ML IJ SOLN
INTRAMUSCULAR | Status: AC
  Filled 2023-01-23: qty 2

## 2023-01-23 NOTE — Evaluation (Addendum)
Physical Therapy Evaluation Patient Details Name: James Dougherty MRN: [id number] DOB: September 04, 1950 Today's Date: 01/23/2023  History of Present Illness  72 y.o. male presents to [facility] 01/22/23 from [facility] rehab for post-op evaluation of recent L AKA (11/15) w/ concern for infection and severe sepsis. S/p I&D of L AKA and application of wound vac 12/27. Plan for OR Monday for further I&D and wound vac change. Past medical history of arthritis, hyperlipidemia, hypertension, neuropathic pain, PAD, psoriasis, stroke, diabetes, CKD 3B, amputation R great toe 2019, R ICA stenosis   Clinical Impression  Pt in bed upon arrival and agreeable to PT eval. Prior to admission, pt was working on slide board transfers to a [facility] with therapy at [facility] rehab. In today's session, pt required MaxAx2 for all mobility. Pt was able to perform lateral scoots at EOB with MaxAx2 and use of bed pad. Pt mentioned having vertigo in the past and feeling nauseous when laying back and turning his head. Had difficulty getting pt into dix-Hallpike position as pt would resist neck extension and rotation due to feeling nauseous. Will continue to assess in future sessions. Pt presents to therapy session with decreased balance, strength, and mobility. Pt would benefit from acute skilled PT to address functional impairments. Recommending post-acute <3 hrs to work towards independence with mobility. Acute PT to follow.          If plan is discharge home, recommend the following: A lot of help with walking and/or transfers;A lot of help with bathing/dressing/bathroom;Direct supervision/assist for medications management;Direct supervision/assist for financial management;Assist for transportation;Help with stairs or ramp for entrance   Can travel by private vehicle   No    Equipment Recommendations Other (comment) (TBD at next venue)     Functional Status Assessment Patient has had a recent decline in their functional status and demonstrates the  ability to make significant improvements in function in a reasonable and predictable amount of time.     Precautions / Restrictions Precautions Precautions: Fall Precaution Comments: L AKA, wound vac Restrictions Weight Bearing Restrictions Per Provider Order: Yes LLE Weight Bearing Per Provider Order: Non weight bearing   Vestibular Smooth pursuits- [facility]  Attempted R and L Dix-Hallpike position, unable to get into position neck ext./rotation- symptomatic w/ no evidence of nystagmus. Will re-attempt in future sessions   Mobility  Bed Mobility Overal bed mobility: Needs Assistance Bed Mobility: Supine to Sit, Sit to Supine     Supine to sit: Max assist, HOB elevated, Used rails Sit to supine: Max assist, HOB elevated, Used rails   General bed mobility comments: MaxAx2 for LE management and trunk elevation usign helicopter method. MaxAx2 for lateral scoots towards [facility] w/ use of bed pad    Transfers  General transfer comment: deferred due to pain       Balance Overall balance assessment: Needs assistance Sitting-balance support: No upper extremity supported, Feet supported Sitting balance-Leahy Scale: Poor Sitting balance - Comments: able to sit on EOB without UE support, has poor posture, poor trunk/neck ROM.         Pertinent Vitals/Pain Pain Assessment Pain Assessment: Faces Faces Pain Scale: Hurts little more Pain Location: L AKA stump Pain Descriptors / Indicators: Aching, Discomfort Pain Intervention(s): Limited activity within patient's tolerance, Monitored during session, Repositioned    Home Living Family/patient expects to be discharged to:: Skilled nursing facility      Prior Function Prior Level of Function : Needs assist       Physical Assist : Mobility (physical);ADLs (  physical) Mobility (physical): Bed mobility;Transfers;Gait ADLs (physical): Dressing;Toileting;Bathing;IADLs Mobility Comments: working on [employer] transfers to chair at  SNF ADLs Comments: needs help for dressing, bathing, toileting at [facility]     Extremity/Trunk Assessment   Upper Extremity Assessment Upper Extremity Assessment: Defer to OT evaluation    Lower Extremity Assessment Lower Extremity Assessment: RLE deficits/detail;LLE deficits/detail RLE Deficits / Details: great toe amputation, at least 3/5 LLE Deficits / Details: AKA, minimal hip flexion and hip ABD/ADD. Alert to light touch    Cervical / Trunk Assessment Cervical / Trunk Assessment: Normal  Communication   Communication Communication: Difficulty communicating thoughts/reduced clarity of speech Cueing Techniques: Verbal cues  Cognition Arousal: Alert Behavior During Therapy: WFL for tasks assessed/performed Overall Cognitive Status: Within Functional Limits for tasks assessed      General Comments General comments (skin integrity, edema, etc.): VSS on RA, complained of nausea throughout the day w/ emesis. Mentioned having vertigo in the past treated by PT. Sacral wound     PT Assessment Patient needs continued PT services  PT Problem List Decreased strength;Decreased range of motion;Decreased activity tolerance;Decreased mobility;Decreased balance;Decreased coordination       PT Treatment Interventions DME instruction;Functional mobility training;Therapeutic activities;Therapeutic exercise;Balance training;Neuromuscular re-education;Patient/family education;Wheelchair mobility training    PT Goals (Current goals can be found in the Care Plan section)  Acute Rehab PT Goals Patient Stated Goal: to get stronger PT Goal Formulation: With patient Time For Goal Achievement: 02/06/23 Potential to Achieve Goals: Good    Frequency Min 1X/week     Co-evaluation   Reason for Co-Treatment: Complexity of the patient's impairments (multi-system involvement);For patient/therapist safety;To address functional/ADL transfers PT goals addressed during session: Mobility/safety with  mobility;Balance         AM-PAC PT "6 Clicks" Mobility  Outcome Measure Help needed turning from your back to your side while in a flat bed without using bedrails?: A Lot Help needed moving from lying on your back to sitting on the side of a flat bed without using bedrails?: Total Help needed moving to and from a bed to a chair (including a wheelchair)?: Total Help needed standing up from a chair using your arms (e.g., wheelchair or bedside chair)?: Total Help needed to walk in hospital room?: Total Help needed climbing 3-5 steps with a railing? : Total 6 Click Score: 7    End of Session   Activity Tolerance: Patient tolerated treatment well Patient left: in bed;with call bell/phone within reach;with bed alarm set Nurse Communication: Mobility status (needs sacral heart) PT Visit Diagnosis: Muscle weakness (generalized) (M62.81);Other abnormalities of gait and mobility (R26.89)    Time: 9147-8295 PT Time Calculation (min) (ACUTE ONLY): 38 min   Charges:   PT Evaluation $PT Eval Moderate Complexity: 1 Mod   PT General Charges $$ ACUTE PT VISIT: 1 Visit         Hilton Cork, PT, DPT Secure Chat Preferred  Rehab Office [phone]   Arturo Morton Brion Aliment 01/23/2023, 3:27 PM

## 2023-01-23 NOTE — Evaluation (Addendum)
Occupational Therapy Evaluation Patient Details Name: James Dougherty MRN: [id number] DOB: [birth year] Today's Date: 01/23/2023   History of Present Illness 72 y.o. 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 S/p I&D of L AKA and application of wound vac 12/27. Plan for OR Monday for further I&D and wound vac change. Past medical history of arthritis, hyperlipidemia, hypertension, neuropathic pain, PAD, psoriasis, stroke, diabetes, CKD 3B, amputation R great toe 2019, R ICA stenosis   Clinical Impression   Pt c/o pain at LLE surgical site, is very HOH, Co-treat with PT. Pt lives alone in [place], has been at [facility] receiving rehab for past few weeks, plans to return to SNF. Pt currently requires significant assistance for all ADLs and mobility, max A x2. Pt has been using transfer board to get to w/c at SNF, will continue to work on lateral scoot transfers and improve as able. Pt would benefit from continued postacute rehab <3hrs/day, will continue to see acutely.       If plan is discharge home, recommend the following: A lot of help with walking and/or transfers;A lot of help with bathing/dressing/bathroom;Direct supervision/assist for medications management;Assist for transportation    Functional Status Assessment  Patient has had a recent decline in their functional status and demonstrates the ability to make significant improvements in function in a reasonable and predictable amount of time.  Equipment Recommendations  None recommended by OT    Recommendations for Other Services       Precautions / Restrictions Precautions Precautions: Fall Precaution Comments: L AKA, wound vac Restrictions Weight Bearing Restrictions Per Provider Order: Yes LLE Weight Bearing Per Provider Order: Non weight bearing      Mobility Bed Mobility Overal bed mobility: Needs Assistance Bed Mobility: Supine to Sit,  Sit to Supine     Supine to sit: Max assist, HOB elevated, Used rails Sit to supine: Max assist, HOB elevated, Used rails   General bed mobility comments: lateral scoots max A x2, max A assist in/out of bed.    Transfers Overall transfer level: Needs assistance                 General transfer comment: did not attempt      Balance Overall balance assessment: Needs assistance Sitting-balance support: No upper extremity supported, Feet supported Sitting balance-Leahy Scale: Poor Sitting balance - Comments: able to sit on EOB without UE support, has poor posture, poor trunk/neck ROM.                                   ADL either performed or assessed with clinical judgement   ADL Overall ADL's : Needs assistance/impaired Eating/Feeding: Set up;Sitting   Grooming: Minimal assistance;Sitting   Upper Body Bathing: Minimal assistance;Sitting   Lower Body Bathing: Maximal assistance;Sitting/lateral leans   Upper Body Dressing : Minimal assistance;Sitting   Lower Body Dressing: Maximal assistance;Sitting/lateral leans       Toileting- Clothing Manipulation and Hygiene: Maximal assistance;Sitting/lateral lean         General ADL Comments: Pt max A for LB ADLs, min A for UB ADLs. Pt was recently using sliding board for transfers to w/c, required max A with lateral scooting up the bed.     Vision         Perception         Praxis  Pertinent Vitals/Pain       Extremity/Trunk Assessment Upper Extremity Assessment Upper Extremity Assessment: Defer to OT evaluation   Lower Extremity Assessment Lower Extremity Assessment: Generalized weakness;LLE deficits/detail;RLE deficits/detail RLE Deficits / Details: great toe amputation, at least 3/5 LLE Deficits / Details: AKA, minimal hip flexion and hip ABD/ADD. Alert to light touch       Communication Communication Communication: Difficulty communicating thoughts/reduced clarity of  speech Cueing Techniques: Verbal cues   Cognition                                             General Comments       Exercises     Shoulder Instructions      Home Living Family/patient expects to be discharged to:: Skilled nursing facility                                        Prior Functioning/Environment Prior Level of Function : Needs assist       Physical Assist : Mobility (physical);ADLs (physical) Mobility (physical): Bed mobility;Transfers;Gait ADLs (physical): Dressing;Toileting;Bathing;IADLs Mobility Comments: working on [employer] transfers to chair at SNF ADLs Comments: needs help for dressing, bathing, toileting at SNF        OT Problem List: Decreased strength;Decreased range of motion;Decreased activity tolerance;Impaired balance (sitting and/or standing);Pain      OT Treatment/Interventions: Self-care/ADL training;Therapeutic exercise;Energy conservation;DME and/or AE instruction;Therapeutic activities;Balance training    OT Goals(Current goals can be found in the care plan section) Acute Rehab OT Goals Patient Stated Goal: to improve strength OT Goal Formulation: With patient Time For Goal Achievement: 02/06/23 Potential to Achieve Goals: Good  OT Frequency: Min 1X/week    Co-evaluation   Reason for Co-Treatment: Complexity of the patient's impairments (multi-system involvement);For patient/therapist safety;To address functional/ADL transfers PT goals addressed during session: Mobility/safety with mobility;Balance        AM-PAC OT "6 Clicks" Daily Activity     Outcome Measure Help from another person eating meals?: A Little Help from another person taking care of personal grooming?: A Little Help from another person toileting, which includes using toliet, bedpan, or urinal?: A Lot Help from another person bathing (including washing, rinsing, drying)?: A Lot Help from another person to put on and taking off  regular upper body clothing?: A Little Help from another person to put on and taking off regular lower body clothing?: A Lot 6 Click Score: 15   End of Session Nurse Communication: Mobility status  Activity Tolerance: Patient tolerated treatment well Patient left: in bed;with call bell/phone within reach;with bed alarm set  OT Visit Diagnosis: Unsteadiness on feet (R26.81);Other abnormalities of gait and mobility (R26.89);Muscle weakness (generalized) (M62.81);Pain Pain - Right/Left: Left Pain - part of body: Leg                Time: 1353-1431 OT Time Calculation (min): 38 min Charges:  OT General Charges $OT Visit: 1 Visit OT Evaluation $OT Eval Moderate Complexity: 1 Mod OT Treatments $Self Care/Home Management : 8-22 mins  [address], OTR/L   Alexis Goodell 01/23/2023, 3:08 PM

## 2023-01-23 NOTE — Plan of Care (Signed)
  Problem: Education: Goal: Knowledge of General Education information will improve Description: Including pain rating scale, medication(s)/side effects and non-pharmacologic comfort measures Outcome: Progressing   Problem: Health Behavior/Discharge Planning: Goal: Ability to manage health-related needs will improve Outcome: Progressing   Problem: Clinical Measurements: Goal: Ability to maintain clinical measurements within normal limits will improve Outcome: Progressing Goal: Will remain free from infection Outcome: Progressing Goal: Diagnostic test results will improve Outcome: Progressing Goal: Respiratory complications will improve Outcome: Progressing Goal: Cardiovascular complication will be avoided Outcome: Progressing   Problem: Activity: Goal: Risk for activity intolerance will decrease Outcome: Progressing   Problem: Nutrition: Goal: Adequate nutrition will be maintained Outcome: Progressing   Problem: Coping: Goal: Level of anxiety will decrease Outcome: Progressing   Problem: Elimination: Goal: Will not experience complications related to bowel motility Outcome: Progressing Goal: Will not experience complications related to urinary retention Outcome: Progressing   Problem: Pain Management: Goal: General experience of comfort will improve Outcome: Progressing   Problem: Safety: Goal: Ability to remain free from injury will improve Outcome: Progressing   Problem: Skin Integrity: Goal: Risk for impaired skin integrity will decrease Outcome: Progressing   Problem: Fluid Volume: Goal: Hemodynamic stability will improve Outcome: Progressing   Problem: Clinical Measurements: Goal: Diagnostic test results will improve Outcome: Progressing Goal: Signs and symptoms of infection will decrease Outcome: Progressing   Problem: Respiratory: Goal: Ability to maintain adequate ventilation will improve Outcome: Progressing   Problem: Education: Goal:  Ability to describe self-care measures that may prevent or decrease complications (Diabetes Survival Skills Education) will improve Outcome: Progressing Goal: Individualized Educational Video(s) Outcome: Progressing   Problem: Coping: Goal: Ability to adjust to condition or change in health will improve Outcome: Progressing   Problem: Fluid Volume: Goal: Ability to maintain a balanced intake and output will improve Outcome: Progressing   Problem: Health Behavior/Discharge Planning: Goal: Ability to identify and utilize available resources and services will improve Outcome: Progressing Goal: Ability to manage health-related needs will improve Outcome: Progressing   Problem: Metabolic: Goal: Ability to maintain appropriate glucose levels will improve Outcome: Progressing   Problem: Nutritional: Goal: Maintenance of adequate nutrition will improve Outcome: Progressing Goal: Progress toward achieving an optimal weight will improve Outcome: Progressing   Problem: Skin Integrity: Goal: Risk for impaired skin integrity will decrease Outcome: Progressing   Problem: Tissue Perfusion: Goal: Adequacy of tissue perfusion will improve Outcome: Progressing

## 2023-01-23 NOTE — Progress Notes (Addendum)
  Progress Note    01/23/2023 9:31 AM 1 Day Post-Op  Subjective: having a lot of pain overnight. Frustrated that no one has brought him water and that he was told he is not allowed to eat   Vitals:   01/23/23 0443 01/23/23 0836  BP: (!) 118/48 108/62  Pulse: 78 81  Resp: 17 16  Temp: (!) 97.4 F (36.3 C) (!) 97.5 F (36.4 C)  SpO2: 100% 96%   Physical Exam: Cardiac:  regular Lungs:  non labored Incisions: Left AKA with VAC to suction, good seal, minimal SS output in canister Neurologic: alert and oriented  CBC    Component Value Date/Time   WBC 12.3 (H) 01/23/2023 0534   RBC 4.38 01/23/2023 0534   HGB 13.5 01/23/2023 0534   HCT 42.1 01/23/2023 0534   PLT 198 01/23/2023 0534   MCV 96.1 01/23/2023 0534   MCH 30.8 01/23/2023 0534   MCHC 32.1 01/23/2023 0534   RDW 14.3 01/23/2023 0534   LYMPHSABS 1.8 01/22/2023 1300   MONOABS 1.6 (H) 01/22/2023 1300   EOSABS 0.2 01/22/2023 1300   BASOSABS 0.1 01/22/2023 1300    BMET    Component Value Date/Time   NA 137 01/23/2023 0534   K 4.4 01/23/2023 0534   CL 102 01/23/2023 0534   CO2 22 01/23/2023 0534   GLUCOSE 177 (H) 01/23/2023 0534   BUN 27 (H) 01/23/2023 0534   CREATININE 1.51 (H) 01/23/2023 0534   CALCIUM 8.2 (L) 01/23/2023 0534   GFRNONAA 49 (L) 01/23/2023 0534   GFRAA 47 (L) 04/28/2017 0238    INR    Component Value Date/Time   INR 1.2 12/11/2022 1105     Intake/Output Summary (Last 24 hours) at 01/23/2023 0931 Last data filed at 01/23/2023 0653 Gross per 24 hour  Intake 1200.53 ml  Output 45 ml  Net 1155.53 ml     Assessment/Plan:  72 y.o. male is s/p  incision and drainage of left above knee amputation (CPT 27301) application of negative pressure wound therapy (21 x 3 x 5cm total volume) (CPT 97606) 1 Day Post-Op   Left AKA VAC with good seal Continue pain control  WBC 12k, afebrile Gram stain with rare GPC. Cx pending On Vanc and Cefepime Plan is for [facility] change again in the OR on Monday  12/30   James Congress, PA-C [facility] [phone] 01/23/2023 9:31 AM  VASCULAR STAFF ADDENDUM: I have independently interviewed and examined the patient. I agree with the above.   James Brunt. Lenell Antu, James Dougherty [facility] Phone Number: [phone] 01/23/2023 10:55 AM

## 2023-01-23 NOTE — Progress Notes (Signed)
PROGRESS NOTE  James Dougherty  DOB: 1950-09-23  PCP: [name], MD NWG:[id number]  DOA: 01/22/2023  LOS: 1 day  Hospital Day: 2  Brief narrative: James Dougherty is a 72 y.o. male with PMH significant for obesity, DM2, HTN, HLD, stroke, PAD, critical right ICA stenosis, CKD, neuropathy, lymphedema.   Recently hospitalized 11/15 - 11/22, for nonhealing left lower extremity wound, underwent left AKA and subsequent discharged to subacute rehab. 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  However he did not have any fever or chills and hence did not require hospitalization. 12/27, SNF sent patient to the ED with drainage from the stump, low-grade fever  In the ED, patient was afebrile, hemodynamically stable Initial labs with WBC count 12,000, lactic acid 2.7 Patient was taken to the OR by vascular surgery underwent I&D of the stump and application of wound VAC.  Purulent material was sent out for cultures Started on IV cefepime, IV vancomycin Admitted to [facility]  Subjective: Patient was seen and examined this morning.  Pleasant elderly male.  Propped up in bed.  Not in distress.   Had 1 episode of emesis earlier.  Given Zofran. Chart reviewed No fever Labs stable Lactic acid not repeated??  Assessment and plan: Severe sepsis POA Infected left AKA stump wound  S/p I&D of the stump and application of wound VAC - 12/27 Dr. Lenell Antu IntraOp culture sent  currently on empiric IV cefepime and IV vancomycin As discussed with vascular surgery, patient would likely need to go back to the OR on Monday for further I&D and wound VAC change. Continue with as needed pain med. Repeat labs tomorrow Recent Labs  Lab 01/22/23 1300 01/22/23 1324 01/23/23 0534  WBC 12.0*  --  12.3*  LATICACIDVEN  --  2.7*  --    PAD S/p Left AKA 11/15 Patient on aspirin, Plavix and  statin,  as discussed with vascular surgery will hold Plavix in anticipation for further surgical procedures. Continue aspirin and statin   Chronic combined systolic and diastolic congestive heart failure Hypertension Last TTE May/2023-LVEF 30-40% , g3dd PTA meds- Toprol 50 mg daily, Lasix 20 mg twice daily, Jardiance 10 mg daily Continue with Toprol 50 mg oral daily Continue to hold Lasix and Jardiance for now  Type 2 diabetes mellitus Diabetic neuropathy A1c 8.4 on November 2020 PTA meds-Levemir, Jardiance and Trulicity Currently on Levemir 10 units along with SSI/Accu-Cheks For neuropathy, continue Cymbalta and gabapentin Recent Labs  Lab 01/22/23 1413 01/22/23 1620 01/22/23 2143 01/23/23 0659 01/23/23 0837  GLUCAP 141* 142* 204* 175* 144*   H/o stroke  right ICA stenosis HLD Continue with aspirin and statin.  Plavix on hold  CKD 3B Renal function at baseline, continue to monitor closely Recent Labs    12/11/22 1105 12/11/22 1934 12/12/22 0330 12/13/22 0925 12/14/22 0521 12/15/22 0535 12/16/22 0749 12/17/22 0756 12/18/22 0656 01/22/23 1300 01/23/23 0534  BUN 15  --  21 26* 23 19 20 18 16 23  27*  CREATININE 1.62* 1.37* 1.32* 1.22 1.29* 1.09 1.17 1.38* 1.17 1.58* 1.51*    Obesity  Body mass index is 30.71 kg/m. Patient has been advised to make an attempt to improve diet and exercise patterns to aid in weight loss.  Mobility: PT eval  Goals of care   Code Status: Full Code     DVT prophylaxis:  enoxaparin (LOVENOX)  injection 40 mg Start: 01/23/23 2200   Antimicrobials: Cefepime IV Fluid: None Consultants: Orthopedics Family Communication: None at bedside  Status: Inpatient Level of care:  Med-Surg   Patient is from: SNF Needs to continue in-hospital care: POD1, pending PT eval Anticipated d/c to: May need reauthorization to go back to SNF.  Pending PT eval    Diet:  Diet Order             Diet heart healthy/carb modified Room service  appropriate? Yes; Fluid consistency: Thin  Diet effective now                   Scheduled Meds:  aspirin EC  81 mg Oral Daily   atorvastatin  80 mg Oral QHS   Chlorhexidine Gluconate Cloth  6 each Topical Q0600   enoxaparin (LOVENOX) injection  40 mg Subcutaneous Q24H   gabapentin  600 mg Oral TID   insulin aspart  0-15 Units Subcutaneous TID WC   insulin aspart  0-5 Units Subcutaneous QHS   insulin glargine-yfgn  10 Units Subcutaneous QHS   metoprolol succinate  50 mg Oral Daily   mupirocin ointment  1 Application Nasal BID   ondansetron       senna-docusate  1 tablet Oral Daily    PRN meds: acetaminophen **OR** acetaminophen, hydrALAZINE, HYDROcodone-acetaminophen, morphine injection, ondansetron, ondansetron (ZOFRAN) IV, polyethylene glycol   Infusions:   ceFEPime (MAXIPIME) IV Stopped (01/23/23 0829)   vancomycin      Antimicrobials: Anti-infectives (From admission, onward)    Start     Dose/Rate Route Frequency Ordered Stop   01/23/23 2100  vancomycin (VANCOREADY) IVPB 1250 mg/250 mL        1,250 mg 166.7 mL/hr over 90 Minutes Intravenous Every 24 hours 01/22/23 1722 01/29/23 2059   01/23/23 1800  vancomycin (VANCOREADY) IVPB 1250 mg/250 mL  Status:  Discontinued        1,250 mg 166.7 mL/hr over 90 Minutes Intravenous Every 24 hours 01/22/23 1617 01/22/23 1722   01/22/23 2055  ceFEPIme (MAXIPIME) 2 g in sodium chloride 0.9 % 100 mL IVPB        2 g 200 mL/hr over 30 Minutes Intravenous Every 12 hours 01/22/23 2055     01/22/23 1745  Vancomycin (VANCOCIN) 1,500 mg in sodium chloride 0.9 % 500 mL IVPB        1,500 mg 250 mL/hr over 120 Minutes Intravenous  Once 01/22/23 1617 01/23/23 0800   01/22/23 1730  ceFEPIme (MAXIPIME) 2 g in sodium chloride 0.9 % 100 mL IVPB  Status:  Discontinued        2 g 200 mL/hr over 30 Minutes Intravenous Every 8 hours 01/22/23 1617 01/22/23 2055   01/22/23 1519  ceFAZolin (ANCEF) 2-4 GM/100ML-% IVPB       Note to Pharmacy: Samuella Cota O: cabinet override      01/22/23 1519 01/23/23 0329       Objective: Vitals:   01/23/23 0443 01/23/23 0836  BP: (!) 118/48 108/62  Pulse: 78 81  Resp: 17 16  Temp: (!) 97.4 F (36.3 C) (!) 97.5 F (36.4 C)  SpO2: 100% 96%    Intake/Output Summary (Last 24 hours) at 01/23/2023 1152 Last data filed at 01/23/2023 1034 Gross per 24 hour  Intake 1420.53 ml  Output 45 ml  Net 1375.53 ml   Filed Weights   01/22/23 1410  Weight: 91.6 kg   Weight change:  Body mass index is 30.71 kg/m.   Physical Exam:  General exam: Pleasant, not in pain or distress Skin: No rashes, lesions or ulcers. HEENT: Atraumatic, normocephalic, no obvious bleeding Lungs: Clear to auscultation bilaterally,  CVS: S1, S2, no murmur,   GI/Abd: Soft, nontender, nondistended, bowel sound present,   CNS: Alert, awake, oriented x 3 Psychiatry: Mood appropriate,  Extremities: No pedal edema, no calf tenderness, left AKA stump postsurgical status  Data Review: I have personally reviewed the laboratory data and studies available.  F/u labs  Unresulted Labs (From admission, onward)     Start     Ordered   01/24/23 0500  Basic metabolic panel  Tomorrow morning,   R        01/23/23 1149   01/24/23 0500  CBC with Differential/Platelet  Tomorrow morning,   R        01/23/23 1149   01/24/23 0500  Lactic acid, plasma  (Lactic Acid)  Tomorrow morning,   R        01/23/23 1149           Total time spent in review of labs and imaging, patient evaluation, formulation of plan, documentation and communication with family: 55 minutes  Signed, Lorin Glass, MD [facility] 01/23/2023

## 2023-01-23 NOTE — Anesthesia Postprocedure Evaluation (Signed)
Anesthesia Post Note  Patient: James Dougherty  Procedure(s) Performed: ABOVE KNEE AMPUTATION WASHOUT (Left: Knee) APPLICATION OF WOUND VAC (Left: Knee)     Patient location during evaluation: PACU Anesthesia Type: General Level of consciousness: awake and alert Pain management: pain level controlled Vital Signs Assessment: post-procedure vital signs reviewed and stable Respiratory status: spontaneous breathing, nonlabored ventilation, respiratory function stable and patient connected to nasal cannula oxygen Cardiovascular status: blood pressure returned to baseline and stable Postop Assessment: no apparent nausea or vomiting Anesthetic complications: no  No notable events documented.  Last Vitals:  Vitals:   01/23/23 0443 01/23/23 0836  BP: (!) 118/48 108/62  Pulse: 78 81  Resp: 17 16  Temp: (!) 36.3 C (!) 36.4 C  SpO2: 100% 96%    Last Pain:  Vitals:   01/23/23 0836  TempSrc: Oral  PainSc:                  Camya Haydon S

## 2023-01-24 ENCOUNTER — Encounter (HOSPITAL_COMMUNITY): Payer: Self-pay | Admitting: Vascular Surgery

## 2023-01-24 DIAGNOSIS — T8740 Infection of amputation stump, unspecified extremity: Secondary | ICD-10-CM | POA: Diagnosis not present

## 2023-01-24 LAB — GLUCOSE, CAPILLARY
Glucose-Capillary: 100 mg/dL — ABNORMAL HIGH (ref 70–99)
Glucose-Capillary: 109 mg/dL — ABNORMAL HIGH (ref 70–99)
Glucose-Capillary: 180 mg/dL — ABNORMAL HIGH (ref 70–99)
Glucose-Capillary: 111 mg/dL — ABNORMAL HIGH (ref 70–99)
Glucose-Capillary: 123 mg/dL — ABNORMAL HIGH (ref 70–99)

## 2023-01-24 LAB — CBC WITH DIFFERENTIAL/PLATELET
Abs Immature Granulocytes: 0.04 10*3/uL (ref 0.00–0.07)
Basophils Absolute: 0.1 10*3/uL (ref 0.0–0.1)
Basophils Relative: 1 %
Eosinophils Absolute: 0.8 10*3/uL — ABNORMAL HIGH (ref 0.0–0.5)
Eosinophils Relative: 9 %
HCT: 37.2 % — ABNORMAL LOW (ref 39.0–52.0)
Hemoglobin: 12.2 g/dL — ABNORMAL LOW (ref 13.0–17.0)
Immature Granulocytes: 0 %
Lymphocytes Relative: 12 %
Lymphs Abs: 1.1 10*3/uL (ref 0.7–4.0)
MCH: 31.4 pg (ref 26.0–34.0)
MCHC: 32.8 g/dL (ref 30.0–36.0)
MCV: 95.9 fL (ref 80.0–100.0)
Monocytes Absolute: 0.9 10*3/uL (ref 0.1–1.0)
Monocytes Relative: 10 %
Neutro Abs: 6.2 10*3/uL (ref 1.7–7.7)
Neutrophils Relative %: 68 %
Platelets: 195 10*3/uL (ref 150–400)
RBC: 3.88 MIL/uL — ABNORMAL LOW (ref 4.22–5.81)
RDW: 14.5 % (ref 11.5–15.5)
WBC: 9.1 10*3/uL (ref 4.0–10.5)
nRBC: 0 % (ref 0.0–0.2)

## 2023-01-24 LAB — LACTIC ACID, PLASMA: Lactic Acid, Venous: 1.2 mmol/L (ref 0.5–1.9)

## 2023-01-24 LAB — BASIC METABOLIC PANEL
Anion gap: 9 (ref 5–15)
BUN: 33 mg/dL — ABNORMAL HIGH (ref 8–23)
CO2: 23 mmol/L (ref 22–32)
Calcium: 7.9 mg/dL — ABNORMAL LOW (ref 8.9–10.3)
Chloride: 105 mmol/L (ref 98–111)
Creatinine, Ser: 1.31 mg/dL — ABNORMAL HIGH (ref 0.61–1.24)
GFR, Estimated: 58 mL/min — ABNORMAL LOW (ref >60)
Glucose, Bld: 119 mg/dL — ABNORMAL HIGH (ref 70–99)
Potassium: 3.9 mmol/L (ref 3.5–5.1)
Sodium: 137 mmol/L (ref 135–145)

## 2023-01-24 NOTE — TOC Initial Note (Signed)
Transition of Care [facility]) - Initial/Assessment Note    Patient Details  Name: James Dougherty MRN: [id number] Date of Birth: 1950-02-07  Transition of Care [facility]) CM/SW Contact:    Delilah Shan, LCSWA Phone Number: 01/24/2023, 11:59 AM  Clinical Narrative:                  CSW received consult for possible SNF placement at time of discharge. CSW spoke with patient regarding PT recommendation of SNF placement at time of discharge. Patient request CSW give his daughter Kara Mead a call to discuss his dc plans.CSW spoke with patients daughter Kara Mead who expressed understanding of PT recommendation and is agreeable to SNF placement at time of discharge. Patients daughter informed CSW that patient comes from [facility] and agreeable for patient to return and receive some short term rehab when medically ready for dc. CSW discussed insurance authorization process. No further questions reported at this time. CSW to continue to follow and assist with discharge planning needs   Expected Discharge Plan: Skilled Nursing Facility Barriers to Discharge: Continued Medical Work up   Patient Goals and CMS Choice Patient states their goals for this hospitalization and ongoing recovery are:: SNF   Choice offered to / list presented to : Adult Children (daughter Kara Mead)      Expected Discharge Plan and Services In-house Referral: Clinical Social Work     Living arrangements for the past 2 months: Skilled Nursing Facility                                      Prior Living Arrangements/Services Living arrangements for the past 2 months: Skilled Nursing Facility Lives with::  (from [facility]) Patient language and need for interpreter reviewed:: Yes Do you feel safe going back to the place where you live?: No   SNF  Need for Family Participation in Patient Care: Yes (Comment) Care giver support system in place?: Yes (comment)   Criminal Activity/Legal Involvement Pertinent to Current  Situation/Hospitalization: No - Comment as needed  Activities of Daily Living   ADL Screening (condition at time of admission) Independently performs ADLs?: No Does the patient have a NEW difficulty with bathing/dressing/toileting/self-feeding that is expected to last >3 days?: No Does the patient have a NEW difficulty with getting in/out of bed, walking, or climbing stairs that is expected to last >3 days?: No Does the patient have a NEW difficulty with communication that is expected to last >3 days?: No Is the patient deaf or have difficulty hearing?: Yes Does the patient have difficulty seeing, even when wearing glasses/contacts?: No Does the patient have difficulty concentrating, remembering, or making decisions?: No  Permission Sought/Granted Permission sought to share information with : Case Manager, Family Supports, Oceanographer granted to share information with : Yes, Verbal Permission Granted  Share Information with NAME: Kara Mead  Permission granted to share info w AGENCY: SNF  Permission granted to share info w Relationship: daughter  Permission granted to share info w Contact Information: Kara Mead- [phone]  Emotional Assessment   Attitude/Demeanor/Rapport: Gracious Affect (typically observed): Calm Orientation: : Oriented to Self, Oriented to Place, Oriented to  Time, Oriented to Situation Alcohol / Substance Use: Not Applicable Psych Involvement: No (comment)  Admission diagnosis:  Amputation stump infection (HCC) [T87.40] Patient Active Problem List   Diagnosis Date Noted   Amputation stump infection (HCC) 01/22/2023   Multiple open wounds of left lower  extremity 12/11/2022   S/P AKA (above knee amputation) unilateral, left (HCC) 12/11/2022   S/P AKA (above knee amputation) unilateral (HCC) 12/11/2022   Lymphedema 12/03/2021   Neuropathic pain 12/03/2021   TBI (traumatic brain injury) (HCC) 11/04/2021   Subdural hematoma (HCC) 10/28/2021    Gangrene (HCC) 04/26/2017   Cellulitis 04/01/2017   Gangrene of toe (HCC) 04/01/2017   Anemia 04/01/2017   Hyperkalemia 04/01/2017   Chronic kidney disease 04/01/2017   Type II diabetes mellitus with complication (HCC) 04/01/2017   Dry gangrene (HCC) 04/01/2017   Groin pain 07/04/2014   Peripheral vascular disease, unspecified (HCC) 06/27/2013   PAD (peripheral artery disease) (HCC) 12/27/2012   PVD (peripheral vascular disease) (HCC) 12/12/2012   Atherosclerosis of native arteries of the extremities with ulceration (HCC) 03/21/2012   Pain in limb 03/21/2012   PCP:  [name], MD Pharmacy:   [facility] [address] [place] [address] [place] [address] Phone: [phone] Fax: [phone]  [facility] [place], [place] - [address] [place] [address] Phone: [phone] Fax: [phone]  [facility] [address] [place] [address] Phone: [phone] Fax: [phone]     Social Drivers of Health (SDOH) Social History: SDOH Screenings   Food Insecurity: No Food Insecurity (01/22/2023)  Housing: Unknown (01/22/2023)  Transportation Needs: No Transportation Needs (01/22/2023)  Utilities: Not At Risk (01/22/2023)  Depression (PHQ2-9): Low Risk  (12/03/2021)  Financial Resource Strain: Patient Declined (10/20/2022)   Received from [facility]  Physical Activity: Inactive (10/20/2022)   Received from [facility]  Social Connections: Patient Declined (10/20/2022)   Received from [facility]  Stress: No Stress Concern Present (10/20/2022)   Received from [facility]  Tobacco Use: Medium Risk (01/22/2023)  Health Literacy: Medium Risk (12/23/2022)   Received [facility]   SDOH Interventions:     Readmission Risk Interventions     No data to display

## 2023-01-24 NOTE — NC FL2 (Signed)
[place] MEDICAID FL2 LEVEL OF CARE FORM     IDENTIFICATION  Patient Name: James Dougherty Birthdate: [birth year] Sex: male Admission Date (Current Location): 01/22/2023  [facility] and [place] Number:  Producer, television/film/video and Address:  [facility] [place], [address] [place], [place] [address]      Provider Number: [id number]  Attending Physician Name and Address:  Lorin Glass, MD  Relative Name and Phone Number:  Kara Mead (daughter) [phone]    Current Level of Care: Hospital Recommended Level of Care: Skilled Nursing Facility Prior Approval Number:    Date Approved/Denied:   PASRR Number: [id number]  Discharge Plan: SNF    Current Diagnoses: Patient Active Problem List   Diagnosis Date Noted   Amputation stump infection (HCC) 01/22/2023   Multiple open wounds of left lower extremity 12/11/2022   S/P AKA (above knee amputation) unilateral, left (HCC) 12/11/2022   S/P AKA (above knee amputation) unilateral (HCC) 12/11/2022   Lymphedema 12/03/2021   Neuropathic pain 12/03/2021   TBI (traumatic brain injury) (HCC) 11/04/2021   Subdural hematoma (HCC) 10/28/2021   Gangrene (HCC) 04/26/2017   Cellulitis 04/01/2017   Gangrene of toe (HCC) 04/01/2017   Anemia 04/01/2017   Hyperkalemia 04/01/2017   Chronic kidney disease 04/01/2017   Type II diabetes mellitus with complication (HCC) 04/01/2017   Dry gangrene (HCC) 04/01/2017   Groin pain 07/04/2014   Peripheral vascular disease, unspecified (HCC) 06/27/2013   PAD (peripheral artery disease) (HCC) 12/27/2012   PVD (peripheral vascular disease) (HCC) 12/12/2012   Atherosclerosis of native arteries of the extremities with ulceration (HCC) 03/21/2012   Pain in limb 03/21/2012    Orientation RESPIRATION BLADDER Height & Weight     Self, Time, Situation, Place  Normal Incontinent, External catheter (External Urinary Catheter) Weight: 202 lb (91.6 kg) Height:  5\' 8"  (172.7 cm)  BEHAVIORAL  SYMPTOMS/MOOD NEUROLOGICAL BOWEL NUTRITION STATUS      Continent (WDL) Diet (Please see discharge summary)  AMBULATORY STATUS COMMUNICATION OF NEEDS Skin   Extensive Assist Verbally Other (Comment) (Ecchymosis,coccyx,Bil.,Erythema,Buttocks,Groin,sacrum,Bil.,Wound/Incision LDAs,PI Coccyx stage 2,Incision closed Leg,L,Wound/Incision open or dehisced (IAD) groin,Negative pressure wound therapy,knee,L)                       Personal Care Assistance Level of Assistance  Bathing, Feeding, Dressing Bathing Assistance: Maximum assistance Feeding assistance: Limited assistance Dressing Assistance: Limited assistance     Functional Limitations Info  Sight, Hearing, Speech Sight Info: Impaired Financial trader) Hearing Info: Impaired Speech Info: Adequate    SPECIAL CARE FACTORS FREQUENCY  PT (By licensed PT), OT (By licensed OT)     PT Frequency: 5x min weekly OT Frequency: 5x min weekly            Contractures Contractures Info: Not present    Additional Factors Info  Code Status, Allergies, Psychotropic, Insulin Sliding Scale Code Status Info: FULL Allergies Info: Oxycodone,Glipizide,other-Soft silicone dressing caused wound to get worse,Bactrim (sulfamethoxazole-trimethoprim) Psychotropic Info: gabapentin (NEURONTIN) capsule 600 mg 3 times daily Insulin Sliding Scale Info: insulin aspart (novoLOG) injection 0-15 Units 3 times daily with meals,insulin aspart (novoLOG) injection 0-5 Units daily at bedtime,insulin glargine-yfgn [facility]) injection 10 Units daily at bedtime       Current Medications (01/24/2023):  This is the current hospital active medication list Current Facility-Administered Medications  Medication Dose Route Frequency Provider Last Rate Last Admin   acetaminophen (TYLENOL) tablet 650 mg  650 mg Oral Q6H PRN Elgergawy, Leana Roe, MD  Or   acetaminophen (TYLENOL) suppository 650 mg  650 mg Rectal Q6H PRN Elgergawy, Leana Roe, MD       aspirin EC tablet 81 mg   81 mg Oral Daily Elgergawy, Leana Roe, MD   81 mg at 01/24/23 0820   atorvastatin (LIPITOR) tablet 80 mg  80 mg Oral QHS Elgergawy, Leana Roe, MD   80 mg at 01/23/23 2108   ceFEPIme (MAXIPIME) 2 g in sodium chloride 0.9 % 100 mL IVPB  2 g Intravenous Q12H Leander Rams, RPH 200 mL/hr at 01/24/23 0820 2 g at 01/24/23 0820   Chlorhexidine Gluconate Cloth 2 % PADS 6 each  6 each Topical Q0600 Elgergawy, Leana Roe, MD   6 each at 01/24/23 0821   enoxaparin (LOVENOX) injection 40 mg  40 mg Subcutaneous Q24H Elgergawy, Leana Roe, MD   40 mg at 01/23/23 2108   gabapentin (NEURONTIN) capsule 600 mg  600 mg Oral TID Elgergawy, Leana Roe, MD   600 mg at 01/24/23 0820   hydrALAZINE (APRESOLINE) injection 5 mg  5 mg Intravenous Q4H PRN Elgergawy, Leana Roe, MD       HYDROcodone-acetaminophen (NORCO/VICODIN) 5-325 MG per tablet 1-2 tablet  1-2 tablet Oral Q4H PRN Elgergawy, Leana Roe, MD   2 tablet at 01/22/23 2130   insulin aspart (novoLOG) injection 0-15 Units  0-15 Units Subcutaneous TID WC Elgergawy, Leana Roe, MD   2 Units at 01/23/23 1617   insulin aspart (novoLOG) injection 0-5 Units  0-5 Units Subcutaneous QHS Elgergawy, Leana Roe, MD       insulin glargine-yfgn (SEMGLEE) injection 10 Units  10 Units Subcutaneous QHS Elgergawy, Leana Roe, MD   10 Units at 01/23/23 2131   metoprolol succinate (TOPROL-XL) 24 hr tablet 50 mg  50 mg Oral Daily Elgergawy, Leana Roe, MD   50 mg at 01/24/23 0820   morphine (PF) 2 MG/ML injection 2 mg  2 mg Intravenous Q2H PRN Elgergawy, Leana Roe, MD   2 mg at 01/22/23 2250   mupirocin ointment (BACTROBAN) 2 % 1 Application  1 Application Nasal BID Elgergawy, Leana Roe, MD   1 Application at 01/24/23 0820   ondansetron (ZOFRAN) injection 4 mg  4 mg Intravenous Q6H PRN Dahal, Melina Schools, MD   4 mg at 01/23/23 1005   polyethylene glycol (MIRALAX / GLYCOLAX) packet 17 g  17 g Oral Daily PRN Elgergawy, Leana Roe, MD       senna-docusate (Senokot-S) tablet 1 tablet  1 tablet Oral Daily Elgergawy,  Leana Roe, MD   1 tablet at 01/24/23 0820   vancomycin (VANCOREADY) IVPB 1250 mg/250 mL  1,250 mg Intravenous Q24H Leander Rams, RPH 166.7 mL/hr at 01/23/23 2135 1,250 mg at 01/23/23 2135     Discharge Medications: Please see discharge summary for a list of discharge medications.  Relevant Imaging Results:  Relevant Lab Results:   Additional Information SSN-[id number]  Delilah Shan, LCSWA

## 2023-01-24 NOTE — Progress Notes (Signed)
PROGRESS NOTE  James Dougherty  DOB: 09-Oct-1950  PCP: [name], MD ZOX:[id number]  DOA: 01/22/2023  LOS: 2 days  Hospital Day: 3  Brief narrative: James Dougherty is a 72 y.o. male with PMH significant for obesity, DM2, HTN, HLD, stroke, PAD, critical right ICA stenosis, CKD, neuropathy, lymphedema.   Recently hospitalized 11/15 - 11/22, for nonhealing left lower extremity wound, underwent left AKA and subsequent discharged to subacute rehab. 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  However he did not have any fever or chills and hence did not require hospitalization. 12/27, SNF sent patient to the ED with drainage from the stump, low-grade fever  In the ED, patient was afebrile, hemodynamically stable Initial labs with WBC count 12,000, lactic acid 2.7 Patient was taken to the OR by vascular surgery underwent I&D of the stump and application of wound VAC.  Purulent material was sent out for cultures Started on IV cefepime, IV vancomycin Admitted to [facility]  Subjective: Patient was seen and examined this morning.   Pleasant elderly male.  Propped up in bed.  Not in distress.   Has wound VAC with serosanguineous drainage. Noted a plan from vascular surgery for repeat debridement in OR tomorrow.  Assessment and plan: Severe sepsis POA Infected left AKA stump wound  S/p I&D of the stump and application of wound VAC - 12/27 Dr. Lenell Antu IntraOp culture sent  currently on empiric IV cefepime and IV vancomycin Per vascular surgery, patient would likely need to go back to the OR on Monday for further I&D and wound VAC change. Continue with as needed pain med. Repeat labs this morning shows improvement in WBC count and lactic acid level. Recent Labs  Lab 01/22/23 1300 01/22/23 1324 01/23/23 0534 01/24/23 0448  WBC 12.0*  --  12.3* 9.1  LATICACIDVEN  --   2.7*  --  1.2   PAD S/p Left AKA 11/15 Patient on aspirin, Plavix and statin,  as discussed with vascular surgery will hold Plavix in anticipation for further surgical procedures. Continue aspirin and statin   Chronic combined systolic and diastolic congestive heart failure Hypertension Last TTE May/2023-LVEF 30-40% , g3dd PTA meds- Toprol 50 mg daily, Lasix 20 mg twice daily, Jardiance 10 mg daily Currently continued with Toprol 50 mg oral daily Continue to hold Lasix and Jardiance for now  Type 2 diabetes mellitus Diabetic neuropathy A1c 8.4 on November 2020 PTA meds-Levemir, Jardiance and Trulicity Currently on Levemir 10 units along with SSI/Accu-Cheks For neuropathy, continue Cymbalta and gabapentin Recent Labs  Lab 01/23/23 1612 01/23/23 1702 01/23/23 2131 01/24/23 0625 01/24/23 0832  GLUCAP 149* 146* 136* 100* 109*   H/o stroke  right ICA stenosis HLD Continue with aspirin and statin.  Plavix on hold  CKD 3B Renal function at baseline, continue to monitor closely Recent Labs    12/12/22 0330 12/13/22 0925 12/14/22 0521 12/15/22 0535 12/16/22 0749 12/17/22 0756 12/18/22 0656 01/22/23 1300 01/23/23 0534 01/24/23 0448  BUN 21 26* 23 19 20 18 16 23  27* 33*  CREATININE 1.32* 1.22 1.29* 1.09 1.17 1.38* 1.17 1.58* 1.51* 1.31*    Obesity  Body mass index is 30.71 kg/m. Patient has been advised to make an attempt to improve diet and exercise patterns to aid in weight loss.  Mobility: PT eval  Goals of care   Code Status: Full Code  DVT prophylaxis:  enoxaparin (LOVENOX) injection 40 mg Start: 01/23/23 2200   Antimicrobials: Cefepime IV Fluid: None Consultants: Orthopedics Family Communication: None at bedside  Status: Inpatient Level of care:  Med-Surg   Patient is from: SNF Needs to continue in-hospital care: POD1, pending repeat or tomorrow Anticipated d/c to: May need reauthorization to go back to SNF.  Pending PT eval    Diet:   Diet Order             Diet NPO time specified  Diet effective midnight           Diet heart healthy/carb modified Room service appropriate? Yes; Fluid consistency: Thin  Diet effective now                   Scheduled Meds:  aspirin EC  81 mg Oral Daily   atorvastatin  80 mg Oral QHS   Chlorhexidine Gluconate Cloth  6 each Topical Q0600   enoxaparin (LOVENOX) injection  40 mg Subcutaneous Q24H   gabapentin  600 mg Oral TID   insulin aspart  0-15 Units Subcutaneous TID WC   insulin aspart  0-5 Units Subcutaneous QHS   insulin glargine-yfgn  10 Units Subcutaneous QHS   metoprolol succinate  50 mg Oral Daily   mupirocin ointment  1 Application Nasal BID   senna-docusate  1 tablet Oral Daily    PRN meds: acetaminophen **OR** acetaminophen, hydrALAZINE, HYDROcodone-acetaminophen, morphine injection, ondansetron (ZOFRAN) IV, polyethylene glycol   Infusions:   ceFEPime (MAXIPIME) IV 2 g (01/24/23 0820)   vancomycin 1,250 mg (01/23/23 2135)    Antimicrobials: Anti-infectives (From admission, onward)    Start     Dose/Rate Route Frequency Ordered Stop   01/23/23 2100  vancomycin (VANCOREADY) IVPB 1250 mg/250 mL        1,250 mg 166.7 mL/hr over 90 Minutes Intravenous Every 24 hours 01/22/23 1722 01/29/23 2059   01/23/23 1800  vancomycin (VANCOREADY) IVPB 1250 mg/250 mL  Status:  Discontinued        1,250 mg 166.7 mL/hr over 90 Minutes Intravenous Every 24 hours 01/22/23 1617 01/22/23 1722   01/22/23 2055  ceFEPIme (MAXIPIME) 2 g in sodium chloride 0.9 % 100 mL IVPB        2 g 200 mL/hr over 30 Minutes Intravenous Every 12 hours 01/22/23 2055     01/22/23 1745  Vancomycin (VANCOCIN) 1,500 mg in sodium chloride 0.9 % 500 mL IVPB        1,500 mg 250 mL/hr over 120 Minutes Intravenous  Once 01/22/23 1617 01/23/23 0800   01/22/23 1730  ceFEPIme (MAXIPIME) 2 g in sodium chloride 0.9 % 100 mL IVPB  Status:  Discontinued        2 g 200 mL/hr over 30 Minutes Intravenous Every 8  hours 01/22/23 1617 01/22/23 2055   01/22/23 1519  ceFAZolin (ANCEF) 2-4 GM/100ML-% IVPB       Note to Pharmacy: Samuella Cota O: cabinet override      01/22/23 1519 01/23/23 0329       Objective: Vitals:   01/23/23 2017 01/24/23 0627  BP: (!) 116/56 113/68  Pulse: 68 63  Resp: 18 17  Temp: 97.9 F (36.6 C) 98.7 F (37.1 C)  SpO2: 95% 95%    Intake/Output Summary (Last 24 hours) at 01/24/2023 1123 Last data filed at 01/24/2023 0700 Gross per 24 hour  Intake 705 ml  Output 1750 ml  Net -1045 ml   Filed Weights   01/22/23 1410  Weight:  91.6 kg   Weight change:  Body mass index is 30.71 kg/m.   Physical Exam: General exam: Pleasant, not in pain or distress Skin: No rashes, lesions or ulcers. HEENT: Atraumatic, normocephalic, no obvious bleeding Lungs: Clear to auscultation bilaterally,  CVS: S1, S2, no murmur,   GI/Abd: Soft, nontender, nondistended, bowel sound present,   CNS: Alert, awake, oriented x 3 Psychiatry: Mood appropriate,  Extremities: No pedal edema, no calf tenderness, left AKA stump postsurgical status.  Wound VAC with serosanguineous drainage  Data Review: I have personally reviewed the laboratory data and studies available.  F/u labs  Unresulted Labs (From admission, onward)     Start     Ordered   01/25/23 0500  CBC with Differential/Platelet  Tomorrow morning,   R        01/24/23 1123   01/25/23 0500  Basic metabolic panel  Tomorrow morning,   R        01/24/23 1123           Total time spent in review of labs and imaging, patient evaluation, formulation of plan, documentation and communication with family: 45 minutes  Signed, Lorin Glass, MD [facility] 01/24/2023

## 2023-01-25 ENCOUNTER — Inpatient Hospital Stay (HOSPITAL_COMMUNITY): Payer: Medicare Other | Admitting: Anesthesiology

## 2023-01-25 ENCOUNTER — Encounter (HOSPITAL_COMMUNITY)
Admission: EM | Disposition: A | Discharge status: Skilled Nursing Facility | Payer: Self-pay | Source: Skilled Nursing Facility | Attending: Internal Medicine

## 2023-01-25 ENCOUNTER — Encounter (HOSPITAL_COMMUNITY): Payer: Self-pay | Admitting: Internal Medicine

## 2023-01-25 ENCOUNTER — Other Ambulatory Visit: Payer: Self-pay

## 2023-01-25 DIAGNOSIS — I110 Hypertensive heart disease with heart failure: Secondary | ICD-10-CM | POA: Diagnosis not present

## 2023-01-25 DIAGNOSIS — T8744 Infection of amputation stump, left lower extremity: Secondary | ICD-10-CM

## 2023-01-25 DIAGNOSIS — T8740 Infection of amputation stump, unspecified extremity: Secondary | ICD-10-CM

## 2023-01-25 DIAGNOSIS — I509 Heart failure, unspecified: Secondary | ICD-10-CM | POA: Diagnosis not present

## 2023-01-25 HISTORY — PX: I & D EXTREMITY: SHX5045

## 2023-01-25 HISTORY — PX: APPLICATION OF WOUND VAC: SHX5189

## 2023-01-25 LAB — GLUCOSE, CAPILLARY
Glucose-Capillary: 93 mg/dL (ref 70–99)
Glucose-Capillary: 96 mg/dL (ref 70–99)
Glucose-Capillary: 87 mg/dL (ref 70–99)
Glucose-Capillary: 78 mg/dL (ref 70–99)
Glucose-Capillary: 182 mg/dL — ABNORMAL HIGH (ref 70–99)
Glucose-Capillary: 200 mg/dL — ABNORMAL HIGH (ref 70–99)

## 2023-01-25 LAB — BASIC METABOLIC PANEL
Anion gap: 9 (ref 5–15)
BUN: 26 mg/dL — ABNORMAL HIGH (ref 8–23)
CO2: 22 mmol/L (ref 22–32)
Calcium: 8.1 mg/dL — ABNORMAL LOW (ref 8.9–10.3)
Chloride: 107 mmol/L (ref 98–111)
Creatinine, Ser: 1.21 mg/dL (ref 0.61–1.24)
GFR, Estimated: >60 mL/min (ref >60)
Glucose, Bld: 98 mg/dL (ref 70–99)
Potassium: 3.9 mmol/L (ref 3.5–5.1)
Sodium: 138 mmol/L (ref 135–145)

## 2023-01-25 LAB — CBC WITH DIFFERENTIAL/PLATELET
Abs Immature Granulocytes: 0.02 10*3/uL (ref 0.00–0.07)
Basophils Absolute: 0.1 10*3/uL (ref 0.0–0.1)
Basophils Relative: 1 %
Eosinophils Absolute: 1 10*3/uL — ABNORMAL HIGH (ref 0.0–0.5)
Eosinophils Relative: 14 %
HCT: 38.9 % — ABNORMAL LOW (ref 39.0–52.0)
Hemoglobin: 12.5 g/dL — ABNORMAL LOW (ref 13.0–17.0)
Immature Granulocytes: 0 %
Lymphocytes Relative: 21 %
Lymphs Abs: 1.5 10*3/uL (ref 0.7–4.0)
MCH: 31.1 pg (ref 26.0–34.0)
MCHC: 32.1 g/dL (ref 30.0–36.0)
MCV: 96.8 fL (ref 80.0–100.0)
Monocytes Absolute: 0.9 10*3/uL (ref 0.1–1.0)
Monocytes Relative: 12 %
Neutro Abs: 3.9 10*3/uL (ref 1.7–7.7)
Neutrophils Relative %: 52 %
Platelets: 216 10*3/uL (ref 150–400)
RBC: 4.02 MIL/uL — ABNORMAL LOW (ref 4.22–5.81)
RDW: 14.7 % (ref 11.5–15.5)
WBC: 7.3 10*3/uL (ref 4.0–10.5)
nRBC: 0 % (ref 0.0–0.2)

## 2023-01-25 SURGERY — IRRIGATION AND DEBRIDEMENT EXTREMITY
Anesthesia: General | Site: Leg Upper | Laterality: Left

## 2023-01-25 MED ORDER — VANCOMYCIN HCL 1.5 G IV SOLR
1500.0000 mg | INTRAVENOUS | Status: DC
  Administered 2023-01-25: 1500 mg via INTRAVENOUS
  Filled 2023-01-25 (×2): qty 30

## 2023-01-25 MED ORDER — OXYCODONE HCL 5 MG/5ML PO SOLN: 5.0000 mg | Freq: Once | ORAL | Status: DC | PRN

## 2023-01-25 MED ORDER — ONDANSETRON HCL 4 MG/2ML IJ SOLN: 4.0000 mg | Freq: Once | INTRAMUSCULAR | Status: DC | PRN

## 2023-01-25 MED ORDER — DEXAMETHASONE SODIUM PHOSPHATE 10 MG/ML IJ SOLN
INTRAMUSCULAR | Status: DC | PRN
  Administered 2023-01-25: 4 mg via INTRAVENOUS

## 2023-01-25 MED ORDER — CHLORHEXIDINE GLUCONATE 0.12 % MT SOLN: 15.0000 mL | Freq: Once | OROMUCOSAL | Status: AC

## 2023-01-25 MED ORDER — ALBUMIN HUMAN 5 % IV SOLN: INTRAVENOUS | Status: DC | PRN

## 2023-01-25 MED ORDER — CHLORHEXIDINE GLUCONATE 0.12 % MT SOLN
OROMUCOSAL | Status: AC
  Administered 2023-01-25: 15 mL via OROMUCOSAL
  Filled 2023-01-25: qty 15

## 2023-01-25 MED ORDER — LIDOCAINE 2% (20 MG/ML) 5 ML SYRINGE
INTRAMUSCULAR | Status: DC | PRN
  Administered 2023-01-25: 100 mg via INTRAVENOUS

## 2023-01-25 MED ORDER — PHENYLEPHRINE 80 MCG/ML (10ML) SYRINGE FOR IV PUSH (FOR BLOOD PRESSURE SUPPORT)
PREFILLED_SYRINGE | INTRAVENOUS | Status: DC | PRN
  Administered 2023-01-25 (×3): 160 ug via INTRAVENOUS

## 2023-01-25 MED ORDER — ONDANSETRON HCL 4 MG/2ML IJ SOLN
INTRAMUSCULAR | Status: DC | PRN
  Administered 2023-01-25: 4 mg via INTRAVENOUS

## 2023-01-25 MED ORDER — FENTANYL CITRATE (PF) 250 MCG/5ML IJ SOLN
INTRAMUSCULAR | Status: AC
  Filled 2023-01-25: qty 5

## 2023-01-25 MED ORDER — 0.9 % SODIUM CHLORIDE (POUR BTL) OPTIME
TOPICAL | Status: DC | PRN
  Administered 2023-01-25: 1000 mL

## 2023-01-25 MED ORDER — FENTANYL CITRATE (PF) 250 MCG/5ML IJ SOLN
INTRAMUSCULAR | Status: DC | PRN
  Administered 2023-01-25: 50 ug via INTRAVENOUS

## 2023-01-25 MED ORDER — FENTANYL CITRATE (PF) 100 MCG/2ML IJ SOLN
25.0000 ug | INTRAMUSCULAR | Status: DC | PRN
Start: 2023-01-25 — End: 2023-01-25
  Administered 2023-01-25: 25 ug via INTRAVENOUS

## 2023-01-25 MED ORDER — ACETAMINOPHEN 10 MG/ML IV SOLN: 1000.0000 mg | Freq: Once | INTRAVENOUS | Status: DC | PRN

## 2023-01-25 MED ORDER — ORAL CARE MOUTH RINSE: 15.0000 mL | Freq: Once | OROMUCOSAL | Status: AC

## 2023-01-25 MED ORDER — PROPOFOL 10 MG/ML IV BOLUS
INTRAVENOUS | Status: DC | PRN
  Administered 2023-01-25: 50 mg via INTRAVENOUS

## 2023-01-25 MED ORDER — SODIUM CHLORIDE 0.9 % IV SOLN: INTRAVENOUS | Status: DC

## 2023-01-25 MED ORDER — LACTATED RINGERS IV SOLN: INTRAVENOUS | Status: DC

## 2023-01-25 MED ORDER — PROPOFOL 10 MG/ML IV BOLUS
INTRAVENOUS | Status: AC
  Filled 2023-01-25: qty 20

## 2023-01-25 MED ORDER — OXYCODONE HCL 5 MG PO TABS: 5.0000 mg | ORAL_TABLET | Freq: Once | ORAL | Status: DC | PRN

## 2023-01-25 MED ORDER — ROCURONIUM BROMIDE 10 MG/ML (PF) SYRINGE
PREFILLED_SYRINGE | INTRAVENOUS | Status: DC | PRN
  Administered 2023-01-25: 70 mg via INTRAVENOUS

## 2023-01-25 MED ORDER — FENTANYL CITRATE (PF) 100 MCG/2ML IJ SOLN
INTRAMUSCULAR | Status: AC
  Filled 2023-01-25: qty 2

## 2023-01-25 MED ORDER — SUGAMMADEX SODIUM 200 MG/2ML IV SOLN
INTRAVENOUS | Status: DC | PRN
  Administered 2023-01-25: 200 mg via INTRAVENOUS

## 2023-01-25 SURGICAL SUPPLY — 31 items
BAG COUNTER SPONGE SURGICOUNT (BAG) ×1 IMPLANT
BNDG ELASTIC 4X5.8 VLCR STR LF (GAUZE/BANDAGES/DRESSINGS) IMPLANT
BNDG ELASTIC 6X5.8 VLCR STR LF (GAUZE/BANDAGES/DRESSINGS) IMPLANT
BNDG GAUZE DERMACEA FLUFF 4 (GAUZE/BANDAGES/DRESSINGS) IMPLANT
CANISTER SUCT 3000ML PPV (MISCELLANEOUS) ×1 IMPLANT
CANISTER WOUNDNEG PRESSURE 500 (CANNISTER) IMPLANT
COVER SURGICAL LIGHT HANDLE (MISCELLANEOUS) ×1 IMPLANT
DRAPE HALF SHEET 40X57 (DRAPES) IMPLANT
DRAPE INCISE IOBAN 66X45 STRL (DRAPES) IMPLANT
DRAPE SURG ORHT 6 SPLT 77X108 (DRAPES) IMPLANT
DRSG ADAPTIC 3X8 NADH LF (GAUZE/BANDAGES/DRESSINGS) IMPLANT
DRSG VAC GRANUFOAM MED (GAUZE/BANDAGES/DRESSINGS) IMPLANT
ELECT REM PT RETURN 9FT ADLT (ELECTROSURGICAL) ×1 IMPLANT
ELECTRODE REM PT RTRN 9FT ADLT (ELECTROSURGICAL) ×1 IMPLANT
GAUZE SPONGE 4X4 12PLY STRL LF (GAUZE/BANDAGES/DRESSINGS) ×1 IMPLANT
GLOVE BIO SURGEON STRL SZ7.5 (GLOVE) ×1 IMPLANT
GOWN STRL REUS W/ TWL LRG LVL3 (GOWN DISPOSABLE) ×2 IMPLANT
GOWN STRL REUS W/ TWL XL LVL3 (GOWN DISPOSABLE) ×1 IMPLANT
KIT BASIN OR (CUSTOM PROCEDURE TRAY) ×1 IMPLANT
KIT TURNOVER KIT B (KITS) ×1 IMPLANT
NS IRRIG 1000ML POUR BTL (IV SOLUTION) ×1 IMPLANT
PACK GENERAL/GYN (CUSTOM PROCEDURE TRAY) IMPLANT
PAD ARMBOARD 7.5X6 YLW CONV (MISCELLANEOUS) ×2 IMPLANT
SUT ETHILON 1 TP 1 60 (SUTURE) IMPLANT
SUT ETHILON 3 0 PS 1 (SUTURE) IMPLANT
SUT VIC AB 2-0 CT1 TAPERPNT 27 (SUTURE) IMPLANT
SUT VIC AB 3-0 SH 27X BRD (SUTURE) IMPLANT
SUT VICRYL 4-0 PS2 18IN ABS (SUTURE) IMPLANT
TOWEL GREEN STERILE (TOWEL DISPOSABLE) ×2 IMPLANT
TOWEL GREEN STERILE FF (TOWEL DISPOSABLE) ×1 IMPLANT
WATER STERILE IRR 1000ML POUR (IV SOLUTION) ×1 IMPLANT

## 2023-01-25 NOTE — Interval H&P Note (Signed)
History and Physical Interval Note:  01/25/2023 11:08 AM  James Dougherty  has presented today for surgery, with the diagnosis of infected left above knee amputation incision.  The various methods of treatment have been discussed with the patient and family. After consideration of risks, benefits and other options for treatment, the patient has consented to  Procedure(s): IRRIGATION AND DEBRIDEMENT AKA (Left) APPLICATION OF WOUND VAC (Left) as a surgical intervention.  The patient's history has been reviewed, patient examined, no change in status, stable for surgery.  I have reviewed the patient's chart and labs.  Questions were answered to the patient's satisfaction.     Leonie Douglas

## 2023-01-25 NOTE — Transfer of Care (Signed)
Immediate Anesthesia Transfer of Care Note  Patient: James Dougherty  Procedure(s) Performed: IRRIGATION AND DEBRIDEMENT AKA (Left: Leg Upper) APPLICATION OF WOUND VAC (Left: Leg Upper)  Patient Location: PACU  Anesthesia Type:General  Level of Consciousness: awake, alert , and oriented  Airway & Oxygen Therapy: Patient Spontanous Breathing  Post-op Assessment: Report given to RN and Post -op Vital signs reviewed and stable  Post vital signs: Reviewed and stable  Last Vitals:  Vitals Value Taken Time  BP 126/78 01/25/23 1220  Temp 36.9 C 01/25/23 1220  Pulse 98 01/25/23 1221  Resp 12 01/25/23 1221  SpO2 96 % 01/25/23 1221  Vitals shown include unfiled device data.  Last Pain:  Vitals:   01/25/23 1011  TempSrc:   PainSc: 0-No pain      Patients Stated Pain Goal: 2 (01/25/23 1011)  Complications: No notable events documented.

## 2023-01-25 NOTE — Care Management Important Message (Signed)
Important Message  Patient Details  Name: James Dougherty MRN: [id number] Date of Birth: 12-01-1950   Important Message Given:  Yes - Medicare IM     Dorena Bodo 01/25/2023, 3:59 PM

## 2023-01-25 NOTE — Op Note (Signed)
DATE OF SERVICE: 01/25/2023  PATIENT:  James Dougherty  72 y.o. male  PRE-OPERATIVE DIAGNOSIS:  left above knee amputation infected wound  POST-OPERATIVE DIAGNOSIS:  Same  PROCEDURE:   1) simple partial closure of left above knee amputation 25cm length (CPT 12004) 2) dressing change under anesthesia to left above knee amputation (CPT 15852)  SURGEON:  Surgeons and Role:    * Leonie Douglas, MD - Primary  ASSISTANT: Nathanial Rancher, PA-C  An experienced assistant was required given the complexity of this procedure and the standard of surgical care. My assistant helped with exposure through counter tension, suctioning, ligation and retraction to better visualize the surgical field.  My assistant expedited sewing during the case by following my sutures. Wherever I use the term "we" in the report, my assistant actively helped me with that portion of the procedure.  ANESTHESIA:   general  EBL: minimal  BLOOD ADMINISTERED:none  DRAINS:  VAC    LOCAL MEDICATIONS USED:  NONE  SPECIMEN:  none  COUNTS: confirmed correct.  TOURNIQUET:  none  PATIENT DISPOSITION:  PACU - hemodynamically stable.   Delay start of Pharmacological VTE agent (>24hrs) due to surgical blood loss or risk of bleeding: no  INDICATION FOR PROCEDURE: James Dougherty is a 72 y.o. male with left above knee amputation wound status post I&D. A vac was applied. After careful discussion of risks, benefits, and alternatives the patient was offered return to OR for dressing change under anesthesia. The patient understood and wished to proceed.  OPERATIVE FINDINGS: wound measured ~ 28cm x 6 x 4 cm. Wound partially reapproximated using 0 nylon sutures.  DESCRIPTION OF PROCEDURE: After identification of the patient in the pre-operative holding area, the patient was transferred to the operating room. The patient was positioned supine on the operating room table. Anesthesia was induced. The left leg was prepped and draped in  standard fashion. A surgical pause was performed confirming correct patient, procedure, and operative location.  A dressing material was removed and the wound prepped with sterile technique.  The wound was carefully inspected.  The wound appeared healthy.  No further debridement was necessary.  I reapproximated the lateral and medial margins of the wound using 0 nylon suture in a mattress technique.  This close the volume of the wound significantly.  A small sponge was applied to the wound.  This was secured with derma tack and Ioban.  Good suction was achieved.  Upon completion of the case instrument and sharps counts were confirmed correct. The patient was transferred to the PACU in good condition. I was present for all portions of the procedure.  Rande Brunt. Lenell Antu, MD [facility] Phone Number: [phone] 01/25/2023 1:48 PM

## 2023-01-25 NOTE — Anesthesia Postprocedure Evaluation (Signed)
Anesthesia Post Note  Patient: James Dougherty  Procedure(s) Performed: IRRIGATION AND DEBRIDEMENT AKA (Left: Leg Upper) APPLICATION OF WOUND VAC (Left: Leg Upper)     Patient location during evaluation: PACU Anesthesia Type: General Level of consciousness: awake and alert Pain management: pain level controlled Vital Signs Assessment: post-procedure vital signs reviewed and stable Respiratory status: spontaneous breathing, nonlabored ventilation, respiratory function stable and patient connected to nasal cannula oxygen Cardiovascular status: blood pressure returned to baseline and stable Postop Assessment: no apparent nausea or vomiting Anesthetic complications: no   No notable events documented.  Last Vitals:  Vitals:   01/25/23 1230 01/25/23 1245  BP: 125/69 110/65  Pulse: 92 (!) 106  Resp: (!) 22 14  Temp:    SpO2: 95% 97%    Last Pain:  Vitals:   01/25/23 1245  TempSrc:   PainSc: 0-No pain                 Mariann Barter

## 2023-01-25 NOTE — Anesthesia Preprocedure Evaluation (Signed)
Anesthesia Evaluation  Patient identified by MRN, date of birth, ID band Patient awake    Reviewed: Allergy & Precautions, NPO status , Patient's Chart, lab work & pertinent test results, reviewed documented beta blocker date and time   History of Anesthesia Complications Negative for: history of anesthetic complications  Airway Mallampati: II  TM Distance: >3 FB     Dental  (+) Edentulous Upper, Edentulous Lower   Pulmonary neg COPD, former smoker   breath sounds clear to auscultation       Cardiovascular hypertension, + Peripheral Vascular Disease and +CHF  (-) Past MI, (-) Cardiac Stents and (-) CABG  Rhythm:Regular Rate:Normal  LVEF ~35%, RV mildly reduced   Neuro/Psych neg Seizures PSYCHIATRIC DISORDERS  Depression     Neuromuscular disease CVA, No Residual Symptoms    GI/Hepatic ,neg GERD  ,,(+) neg Cirrhosis        Endo/Other  diabetes, Type 2    Renal/GU CRFRenal disease     Musculoskeletal  (+) Arthritis ,    Abdominal   Peds  Hematology  (+) Blood dyscrasia, anemia   Anesthesia Other Findings   Reproductive/Obstetrics                              Anesthesia Physical Anesthesia Plan  ASA: 3  Anesthesia Plan: General   Post-op Pain Management:    Induction: Intravenous  PONV Risk Score and Plan: 2 and Ondansetron  Airway Management Planned: Oral ETT  Additional Equipment:   Intra-op Plan:   Post-operative Plan: Extubation in OR  Informed Consent: I have reviewed the patients History and Physical, chart, labs and discussed the procedure including the risks, benefits and alternatives for the proposed anesthesia with the patient or authorized representative who has indicated his/her understanding and acceptance.     Dental advisory given  Plan Discussed with: CRNA  Anesthesia Plan Comments:          Anesthesia Quick Evaluation

## 2023-01-25 NOTE — Progress Notes (Signed)
PROGRESS NOTE  James Dougherty ZOX:[id number] DOB: Apr 30, 1950 DOA: 01/22/2023 PCP: [name], MD   LOS: 3 days   Brief Narrative / Interim history: 72 year old male with history of DM2, HTN, HLD, prior CVA, HOH, PAD, right ICA stenosis, CKD, neuropathy, lymphedema comes into the hospital with infected left AKA stump wound. Recently hospitalized 11/15 - 11/22, for nonhealing left lower extremity wound, underwent left AKA and subsequent discharged to subacute rehab. 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  However he did not have any fever or chills and hence did not require hospitalization. 12/27, SNF sent patient to the ED with drainage from the stump, low-grade fever.  He was taken to the OR by vascular surgery on 12/27 s/p  I&D and wound VAC placement, and repeat surgery 12/30  Subjective / 24h Interval events: Doing well this morning, awaiting surgery.  No significant events, feels overall well without chest pain, shortness of breath  Assesement and Plan: Principal Problem:   Amputation stump infection (HCC) Active Problems:   Peripheral vascular disease, unspecified (HCC)   Chronic kidney disease   Type II diabetes mellitus with complication (HCC)   Neuropathic pain   S/P AKA (above knee amputation) unilateral (HCC)   Principal problem Severe sepsis due to left AKA stump wound infection -vascular surgery consulted and following, he was taken to the OR on 12/27 status post I&D, repeat surgery today.  Intraoperative culture 2/27 with Staph aureus, susceptibilities pending  Active problems PAD - on aspirin, statin, Plavix  Chronic combined CHF-last echocardiogram in May 2022 he showed LVEF 30-40%, grade 3 diastolic dysfunction.  Continue Toprol, hold Lasix and Jardiance for now, resume postoperatively  Essential hypertension-continue  metoprolol  DM2, poorly controlled, with hyperglycemia-A1c 8.4, currently on Levemir and sliding scale.  Diabetic neuropathy-continue medications as below  Prior CVA, right ICA stenosis-continue aspirin and statin.  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.5, currently close to baseline  Scheduled Meds:  [MAR Hold] aspirin EC  81 mg Oral Daily   [MAR Hold] atorvastatin  80 mg Oral QHS   [MAR Hold] Chlorhexidine Gluconate Cloth  6 each Topical Q0600   [MAR Hold] enoxaparin (LOVENOX) injection  40 mg Subcutaneous Q24H   [MAR Hold] gabapentin  600 mg Oral TID   [MAR Hold] insulin aspart  0-15 Units Subcutaneous TID WC   [MAR Hold] insulin aspart  0-5 Units Subcutaneous QHS   [MAR Hold] insulin glargine-yfgn  10 Units Subcutaneous QHS   [MAR Hold] metoprolol succinate  50 mg Oral Daily   [MAR Hold] mupirocin ointment  1 Application Nasal BID   [MAR Hold] senna-docusate  1 tablet Oral Daily   Continuous Infusions:  sodium chloride 10 mL/hr at 01/25/23 1014   [MAR Hold] ceFEPime (MAXIPIME) IV 2 g (01/25/23 1034)   [MAR Hold] vancomycin 1,250 mg (01/24/23 2105)   PRN Meds:.[MAR Hold] acetaminophen **OR** [MAR Hold] acetaminophen, [MAR Hold] hydrALAZINE, [MAR Hold] HYDROcodone-acetaminophen, [MAR Hold]  morphine injection, [MAR Hold] ondansetron (ZOFRAN) IV, [MAR Hold] polyethylene glycol  Current Outpatient Medications  Medication Instructions   aspirin EC 81 mg, Daily   atorvastatin (LIPITOR) 80 mg, Oral, Daily at bedtime   Basaglar KwikPen 21 Units, Subcutaneous, Daily at bedtime   bisacodyl (DULCOLAX) 5 mg, Daily PRN   clopidogrel (PLAVIX) 75 mg, Oral, Daily   empagliflozin (JARDIANCE)  10 mg, Oral, Daily   furosemide (LASIX) 20 mg, Oral, Daily   gabapentin (NEURONTIN) 600 mg, 3 times daily   insulin aspart (NOVOLOG) 0-15 Units, Subcutaneous, 3 times daily with meals   insulin aspart (NOVOLOG) 0-5 Units, Subcutaneous,  Daily at bedtime   Melatonin 3 mg, At bedtime PRN   Menthol-Zinc Oxide (CALMOSEPTINE EX) 1 Application, 2 times daily   MENTHOL-ZINC OXIDE EX 1 Application, 4 times daily PRN   metoprolol succinate (TOPROL-XL) 50 mg, Daily   polyethylene glycol (MIRALAX / GLYCOLAX) 17 g, Oral, Daily PRN   Protein POWD 6 g, 3 times daily   senna-docusate (SENOKOT-S) 8.6-50 MG tablet 1 tablet, Oral, Daily   traMADol (ULTRAM) 50 mg, Oral, Every 6 hours PRN   Trulicity 1.5 mg, Weekly    Diet Orders (From admission, onward)     Start     Ordered   01/25/23 0001  Diet NPO time specified  Diet effective midnight        01/24/23 0807            DVT prophylaxis: enoxaparin (LOVENOX) injection 40 mg Start: 01/23/23 2200   Lab Results  Component Value Date   PLT 216 01/25/2023      Code Status: Full Code  Family Communication: no family at bedside   Status is: Inpatient Remains inpatient appropriate because: severity of illness  Level of care: Med-Surg  Consultants:  Vascular surgery  Objective: Vitals:   01/24/23 2107 01/25/23 0417 01/25/23 0928 01/25/23 1003  BP: (!) 115/52 109/65 117/76 108/66  Pulse: 82 78  62  Resp: 17 18 16 17   Temp: 97.6 F (36.4 C) 98 F (36.7 C) 97.9 F (36.6 C) 97.6 F (36.4 C)  TempSrc: Oral Oral Oral Oral  SpO2: 98% 97% 94% 95%  Weight:    89.8 kg  Height:    5\' 8"  (1.727 m)    Intake/Output Summary (Last 24 hours) at 01/25/2023 1138 Last data filed at 01/25/2023 0644 Gross per 24 hour  Intake 225 ml  Output 1150 ml  Net -925 ml   Wt Readings from Last 3 Encounters:  01/25/23 89.8 kg  01/18/23 92.5 kg  12/18/22 92.2 kg    Examination:  Constitutional: NAD Eyes: no scleral icterus ENMT: Mucous membranes are moist.  Neck: normal, supple Respiratory: clear to auscultation bilaterally, no wheezing, no crackles. Normal respiratory effort. Cardiovascular: Regular rate and rhythm, no murmurs / rubs / gallops. No LE edema.  Abdomen: non  distended, no tenderness. Bowel sounds positive.  Musculoskeletal: no clubbing / cyanosis.    Data Reviewed: I have independently reviewed following labs and imaging studies   CBC Recent Labs  Lab 01/22/23 1300 01/23/23 0534 01/24/23 0448 01/25/23 0528  WBC 12.0* 12.3* 9.1 7.3  HGB 14.4 13.5 12.2* 12.5*  HCT 44.3 42.1 37.2* 38.9*  PLT 226 198 195 216  MCV 96.1 96.1 95.9 96.8  MCH 31.2 30.8 31.4 31.1  MCHC 32.5 32.1 32.8 32.1  RDW 14.5 14.3 14.5 14.7  LYMPHSABS 1.8  --  1.1 1.5  MONOABS 1.6*  --  0.9 0.9  EOSABS 0.2  --  0.8* 1.0*  BASOSABS 0.1  --  0.1 0.1    Recent Labs  Lab 01/22/23 1300 01/22/23 1324 01/23/23 0534 01/24/23 0448 01/25/23 0528  NA 132*  --  137 137 138  K 4.4  --  4.4 3.9 3.9  CL 94*  --  102 105 107  CO2 23  --  22 23 22  GLUCOSE 130*  --  177* 119* 98  BUN 23  --  27* 33* 26*  CREATININE 1.58*  --  1.51* 1.31* 1.21  CALCIUM 8.5*  --  8.2* 7.9* 8.1*  AST 39  --   --   --   --   ALT 24  --   --   --   --   ALKPHOS 86  --   --   --   --   BILITOT 1.5*  --   --   --   --   ALBUMIN 2.7*  --   --   --   --   LATICACIDVEN  --  2.7*  --  1.2  --     ------------------------------------------------------------------------------------------------------------------ No results for input(s): "CHOL", "HDL", "LDLCALC", "TRIG", "CHOLHDL", "LDLDIRECT" in the last 72 hours.  Lab Results  Component Value Date   HGBA1C 8.4 (H) 12/11/2022   ------------------------------------------------------------------------------------------------------------------ No results for input(s): "TSH", "T4TOTAL", "T3FREE", "THYROIDAB" in the last 72 hours.  Invalid input(s): "FREET3"  Cardiac Enzymes No results for input(s): "CKMB", "TROPONINI", "MYOGLOBIN" in the last 168 hours.  Invalid input(s): "CK" ------------------------------------------------------------------------------------------------------------------ No results found for: "BNP"  CBG: Recent Labs   Lab 01/24/23 1627 01/24/23 2134 01/25/23 0631 01/25/23 0930 01/25/23 1122  GLUCAP 111* 123* 93 96 87    Recent Results (from the past 240 hours)  Blood Cultures x 2 sites     Status: None (Preliminary result)   Collection Time: 01/22/23 12:40 PM   Specimen: BLOOD  Result Value Ref Range Status   Specimen Description BLOOD SITE NOT SPECIFIED  Final   Special Requests   Final    BOTTLES DRAWN AEROBIC AND ANAEROBIC Blood Culture results may not be optimal due to an inadequate volume of blood received in culture bottles   Culture   Final    NO GROWTH 2 DAYS Performed at [facility], [address], [place], [place] [address]    Report Status PENDING  Incomplete  Blood Cultures x 2 sites     Status: None (Preliminary result)   Collection Time: 01/22/23  1:00 PM   Specimen: BLOOD  Result Value Ref Range Status   Specimen Description BLOOD SITE NOT SPECIFIED  Final   Special Requests   Final    BOTTLES DRAWN AEROBIC AND ANAEROBIC Blood Culture results may not be optimal due to an inadequate volume of blood received in culture bottles   Culture   Final    NO GROWTH 2 DAYS Performed at [facility], [address] [place], [place] [address]    Report Status PENDING  Incomplete  Surgical pcr screen     Status: Abnormal   Collection Time: 01/22/23  2:12 PM   Specimen: Nasal Mucosa; Nasal Swab  Result Value Ref Range Status   MRSA, PCR POSITIVE (A) NEGATIVE Final    Comment: RESULT CALLED TO, READ BACK BY AND VERIFIED WITH: RN Lamar Laundry [id number] @1804  FH    Staphylococcus aureus POSITIVE (A) NEGATIVE Final    Comment: (NOTE) The Xpert SA Assay (FDA approved for NASAL specimens in patients 44 years of age and older), is one component of a comprehensive surveillance program. It is not intended to diagnose infection nor to guide or monitor treatment. Performed at [facility], [address], [place], [place] [address]   Aerobic/Anaerobic Culture w Gram Stain  (surgical/deep wound)     Status: None (Preliminary result)   Collection Time: 01/22/23  3:51 PM  Specimen: Wound  Result Value Ref Range Status   Specimen Description WOUND  Final   Special Requests ABOVE LEFT KNEE AMPUTATION  Final   Gram Stain   Final    ABUNDANT WBC PRESENT, PREDOMINANTLY PMN RARE GRAM POSITIVE COCCI IN PAIRS Performed at [facility], [address], [place], [place] [address]    Culture   Final    ABUNDANT STAPHYLOCOCCUS AUREUS CONFIRMATION OF SUSCEPTIBILITIES IN PROGRESS NO ANAEROBES ISOLATED; CULTURE IN PROGRESS FOR 5 DAYS    Report Status PENDING  Incomplete     Radiology Studies: No results found.   Pamella Pert, MD, PhD [facility]  Between 7 am - 7 pm I am available, please contact me via Amion (for emergencies) or Securechat (non urgent messages)  Between 7 pm - 7 am I am not available, please contact night coverage MD/APP via Amion

## 2023-01-25 NOTE — Progress Notes (Signed)
Pharmacy Antibiotic Note  James Dougherty is a 72 y.o. male admitted on 01/22/2023 with amputation site infection s/p I&D in OR 12/27 with plan for repeat 12/30.  Pharmacy has been consulted for vancomycin and cefepime dosing.  Culture growing abundant Staph aureus, sensitivities pending. Afebrile, WBC down to normal, SCr back to baseline.  Plan: Increase vancomycin 1500 mg IV q24h  - eAUC 476.3, SCr 1.21, Vd 0.72 Cefepime 2g IV q12h Monitor cultures, clinical status, renal function, vancomycin level Narrow abx as able and f/u duration    Height: 5\' 8"  (172.7 cm) Weight: 89.8 kg (198 lb) IBW/kg (Calculated) : 68.4  Temp (24hrs), Avg:98.1 F (36.7 C), Min:97.6 F (36.4 C), Max:98.6 F (37 C)  Recent Labs  Lab 01/22/23 1300 01/22/23 1324 01/23/23 0534 01/24/23 0448 01/25/23 0528  WBC 12.0*  --  12.3* 9.1 7.3  CREATININE 1.58*  --  1.51* 1.31* 1.21  LATICACIDVEN  --  2.7*  --  1.2  --     Estimated Creatinine Clearance: 60.1 mL/min (by C-G formula based on SCr of 1.21 mg/dL).    Allergies  Allergen Reactions   Oxycodone Nausea And Vomiting   Glipizide     Bad headache and blurred vision   Other     Soft silicone dressing caused wound to get worse   Bactrim [Sulfamethoxazole-Trimethoprim] Itching and Rash    Antimicrobials this admission: Cefe 12/27 >>  vanc 12/27 >>   Dose adjustments this admission: N/a  Microbiology results: 12/27 BCx: ngtd 12/27 OR cx: abundant Staph aureus 12/27 MRSA: positive  Thank you for involving pharmacy in this patient's care.  Loura Back, PharmD, BCPS Clinical Pharmacist Clinical phone for 01/25/2023 is [phone] 01/25/2023 1:50 PM

## 2023-01-25 NOTE — Progress Notes (Signed)
  Progress Note    01/25/2023 8:11 AM 3 Days Post-Op  Subjective:  no complaints   Vitals:   01/24/23 2107 01/25/23 0417  BP: (!) 115/52 109/65  Pulse: 82 78  Resp: 17 18  Temp: 97.6 F (36.4 C) 98 F (36.7 C)  SpO2: 98% 97%   Physical Exam: Lungs:  non labored Incisions:  dressing left in place L AKA Neurologic: A&O  CBC    Component Value Date/Time   WBC 7.3 01/25/2023 0528   RBC 4.02 (L) 01/25/2023 0528   HGB 12.5 (L) 01/25/2023 0528   HCT 38.9 (L) 01/25/2023 0528   PLT 216 01/25/2023 0528   MCV 96.8 01/25/2023 0528   MCH 31.1 01/25/2023 0528   MCHC 32.1 01/25/2023 0528   RDW 14.7 01/25/2023 0528   LYMPHSABS 1.5 01/25/2023 0528   MONOABS 0.9 01/25/2023 0528   EOSABS 1.0 (H) 01/25/2023 0528   BASOSABS 0.1 01/25/2023 0528    BMET    Component Value Date/Time   NA 138 01/25/2023 0528   K 3.9 01/25/2023 0528   CL 107 01/25/2023 0528   CO2 22 01/25/2023 0528   GLUCOSE 98 01/25/2023 0528   BUN 26 (H) 01/25/2023 0528   CREATININE 1.21 01/25/2023 0528   CALCIUM 8.1 (L) 01/25/2023 0528   GFRNONAA >60 01/25/2023 0528   GFRAA 47 (L) 04/28/2017 0238    INR    Component Value Date/Time   INR 1.2 12/11/2022 1105     Intake/Output Summary (Last 24 hours) at 01/25/2023 0811 Last data filed at 01/25/2023 0644 Gross per 24 hour  Intake 432.44 ml  Output 1950 ml  Net -1517.56 ml     Assessment/Plan:  72 y.o. male is s/p L AKA with subsequent washout 3 Days Post-Op   Plan is for return to the OR today with Dr. Lenell Antu for L AKA debridement and possible wound vac placement.  He is agreeable to proceed.  Continue npo.  Consent ordered    Emilie Rutter, PA-C [facility] [phone] 01/25/2023 8:11 AM

## 2023-01-25 NOTE — Progress Notes (Signed)
CCC Pre-op Review  Pre-op checklist:  NOT complete time of note / RN asked to complete  NPO:  yes  Labs:  @0528   k 3.9  bun 26   wbc 7.3  h/h 12.5/38.9  Consent:  order written /  not com[pleted  H&P:  needs update  Vitals:  @ 0928    97.9 p (not recorded) r 16  117/76 ( 88)   94% RA  O2 requirements: room air  MAR/PTA review:  * insulin held this am  Maxipime 2g q12h Vanco 1250mg  q 24h   IV:   20g R FA  Floor nurse name:   jerica hartford RN  Additional info:   DM HTN  HLD CVA  NEUROPATHY  *CRITICAL R ICA STENOSIS*

## 2023-01-25 NOTE — Anesthesia Procedure Notes (Signed)
Procedure Name: Intubation Date/Time: 01/25/2023 11:37 AM  Performed by: Sandie Ano, CRNAPre-anesthesia Checklist: Patient identified, Emergency Drugs available, Suction available and Patient being monitored Patient Re-evaluated:Patient Re-evaluated prior to induction Oxygen Delivery Method: Circle System Utilized Preoxygenation: Pre-oxygenation with 100% oxygen Induction Type: IV induction Ventilation: Mask ventilation without difficulty Laryngoscope Size: Mac and 3 Grade View: Grade I Tube type: Oral Tube size: 7.0 mm Number of attempts: 1 Airway Equipment and Method: Stylet and Oral airway Placement Confirmation: ETT inserted through vocal cords under direct vision, positive ETCO2 and breath sounds checked- equal and bilateral Secured at: 23 cm Tube secured with: Tape Dental Injury: Teeth and Oropharynx as per pre-operative assessment

## 2023-01-25 NOTE — H&P (View-Only) (Signed)
  Progress Note    01/25/2023 8:11 AM 3 Days Post-Op  Subjective:  no complaints   Vitals:   01/24/23 2107 01/25/23 0417  BP: (!) 115/52 109/65  Pulse: 82 78  Resp: 17 18  Temp: 97.6 F (36.4 C) 98 F (36.7 C)  SpO2: 98% 97%   Physical Exam: Lungs:  non labored Incisions:  dressing left in place L AKA Neurologic: A&O  CBC    Component Value Date/Time   WBC 7.3 01/25/2023 0528   RBC 4.02 (L) 01/25/2023 0528   HGB 12.5 (L) 01/25/2023 0528   HCT 38.9 (L) 01/25/2023 0528   PLT 216 01/25/2023 0528   MCV 96.8 01/25/2023 0528   MCH 31.1 01/25/2023 0528   MCHC 32.1 01/25/2023 0528   RDW 14.7 01/25/2023 0528   LYMPHSABS 1.5 01/25/2023 0528   MONOABS 0.9 01/25/2023 0528   EOSABS 1.0 (H) 01/25/2023 0528   BASOSABS 0.1 01/25/2023 0528    BMET    Component Value Date/Time   NA 138 01/25/2023 0528   K 3.9 01/25/2023 0528   CL 107 01/25/2023 0528   CO2 22 01/25/2023 0528   GLUCOSE 98 01/25/2023 0528   BUN 26 (H) 01/25/2023 0528   CREATININE 1.21 01/25/2023 0528   CALCIUM 8.1 (L) 01/25/2023 0528   GFRNONAA >60 01/25/2023 0528   GFRAA 47 (L) 04/28/2017 0238    INR    Component Value Date/Time   INR 1.2 12/11/2022 1105     Intake/Output Summary (Last 24 hours) at 01/25/2023 0811 Last data filed at 01/25/2023 0644 Gross per 24 hour  Intake 432.44 ml  Output 1950 ml  Net -1517.56 ml     Assessment/Plan:  72 y.o. male is s/p L AKA with subsequent washout 3 Days Post-Op   Plan is for return to the OR today with Dr. Lenell Antu for L AKA debridement and possible wound vac placement.  He is agreeable to proceed.  Continue npo.  Consent ordered    Emilie Rutter, PA-C [facility] [phone] 01/25/2023 8:11 AM

## 2023-01-26 ENCOUNTER — Encounter (HOSPITAL_COMMUNITY): Payer: Self-pay | Admitting: Vascular Surgery

## 2023-01-26 DIAGNOSIS — T8740 Infection of amputation stump, unspecified extremity: Secondary | ICD-10-CM

## 2023-01-26 LAB — CBC
HCT: 37.3 % — ABNORMAL LOW (ref 39.0–52.0)
Hemoglobin: 12.6 g/dL — ABNORMAL LOW (ref 13.0–17.0)
MCH: 32.4 pg (ref 26.0–34.0)
MCHC: 33.8 g/dL (ref 30.0–36.0)
MCV: 95.9 fL (ref 80.0–100.0)
Platelets: 329 10*3/uL (ref 150–400)
RBC: 3.89 MIL/uL — ABNORMAL LOW (ref 4.22–5.81)
RDW: 15 % (ref 11.5–15.5)
WBC: 7.8 10*3/uL (ref 4.0–10.5)
nRBC: 0 % (ref 0.0–0.2)

## 2023-01-26 LAB — COMPREHENSIVE METABOLIC PANEL
ALT: 30 U/L (ref 0–44)
AST: 32 U/L (ref 15–41)
Albumin: 2.2 g/dL — ABNORMAL LOW (ref 3.5–5.0)
Alkaline Phosphatase: 68 U/L (ref 38–126)
Anion gap: 9 (ref 5–15)
BUN: 21 mg/dL (ref 8–23)
CO2: 20 mmol/L — ABNORMAL LOW (ref 22–32)
Calcium: 8.1 mg/dL — ABNORMAL LOW (ref 8.9–10.3)
Chloride: 111 mmol/L (ref 98–111)
Creatinine, Ser: 1.01 mg/dL (ref 0.61–1.24)
GFR, Estimated: >60 mL/min (ref >60)
Glucose, Bld: 139 mg/dL — ABNORMAL HIGH (ref 70–99)
Potassium: 4.3 mmol/L (ref 3.5–5.1)
Sodium: 140 mmol/L (ref 135–145)
Total Bilirubin: 0.5 mg/dL (ref 0.0–1.2)
Total Protein: 5.5 g/dL — ABNORMAL LOW (ref 6.5–8.1)

## 2023-01-26 LAB — MAGNESIUM: Magnesium: 2.3 mg/dL (ref 1.7–2.4)

## 2023-01-26 LAB — GLUCOSE, CAPILLARY
Glucose-Capillary: 148 mg/dL — ABNORMAL HIGH (ref 70–99)
Glucose-Capillary: 185 mg/dL — ABNORMAL HIGH (ref 70–99)
Glucose-Capillary: 122 mg/dL — ABNORMAL HIGH (ref 70–99)
Glucose-Capillary: 123 mg/dL — ABNORMAL HIGH (ref 70–99)

## 2023-01-26 MED ORDER — VANCOMYCIN HCL 10 G IV SOLR: 1500.0000 mg | Freq: Once | INTRAVENOUS | Status: DC

## 2023-01-26 MED ORDER — VANCOMYCIN HCL 1500 MG/300ML IV SOLN
1500.0000 mg | INTRAVENOUS | Status: AC
Start: 2023-01-26 — End: 2023-01-29
  Administered 2023-01-26 – 2023-01-28 (×3): 1500 mg via INTRAVENOUS
  Filled 2023-01-26 (×3): qty 300

## 2023-01-26 NOTE — Progress Notes (Addendum)
  Progress Note    01/26/2023 6:59 AM 1 Day Post-Op  Subjective:  resting-says he is feeling better.  Leg feels ok.  afebrile  Vitals:   01/26/23 0000 01/26/23 0543  BP: 102/85 110/61  Pulse: 83 77  Resp: 17 18  Temp: 97.8 F (36.6 C) (!) 97.5 F (36.4 C)  SpO2: 94% 99%    Physical Exam: General:  no distress Lungs:  non labored Incisions:  left AKA with wound vac with good seal   CBC    Component Value Date/Time   WBC 7.8 01/26/2023 0509   RBC 3.89 (L) 01/26/2023 0509   HGB 12.6 (L) 01/26/2023 0509   HCT 37.3 (L) 01/26/2023 0509   PLT 329 01/26/2023 0509   MCV 95.9 01/26/2023 0509   MCH 32.4 01/26/2023 0509   MCHC 33.8 01/26/2023 0509   RDW 15.0 01/26/2023 0509   LYMPHSABS 1.5 01/25/2023 0528   MONOABS 0.9 01/25/2023 0528   EOSABS 1.0 (H) 01/25/2023 0528   BASOSABS 0.1 01/25/2023 0528    BMET    Component Value Date/Time   NA 140 01/26/2023 0509   K 4.3 01/26/2023 0509   CL 111 01/26/2023 0509   CO2 20 (L) 01/26/2023 0509   GLUCOSE 139 (H) 01/26/2023 0509   BUN 21 01/26/2023 0509   CREATININE 1.01 01/26/2023 0509   CALCIUM  8.1 (L) 01/26/2023 0509   GFRNONAA >60 01/26/2023 0509   GFRAA 47 (L) 04/28/2017 0238    INR    Component Value Date/Time   INR 1.2 12/11/2022 1105     Intake/Output Summary (Last 24 hours) at 01/26/2023 0659 Last data filed at 01/25/2023 2350 Gross per 24 hour  Intake 1166.73 ml  Output 1460 ml  Net -293.27 ml    Specimen Description WOUND  Special Requests ABOVE LEFT KNEE AMPUTATION  Gram Stain ABUNDANT WBC PRESENT, PREDOMINANTLY PMN RARE GRAM POSITIVE COCCI IN PAIRS Performed at [facility], [address] [place], [place] [address]  Culture ABUNDANT STAPHYLOCOCCUS AUREUS CONFIRMATION OF SUSCEPTIBILITIES IN PROGRESS NO ANAEROBES ISOLATED; CULTURE IN PROGRESS FOR 5 DAYS  Report Status PENDING    Assessment/Plan:  72 y.o. male is s/p:  I&D left AKA and wound vac 01/22/2023 and simple partial closure  of left AKA and wound vac placement 01/25/2023 both by Dr. Magda. 1 Day Post-Op   -pt subjectively feels better -gram stain and cx growing abundant staph aureus.  Susceptibilities in progress.  Dr. Magda to determine plan about abx and future vac changes.  ? Return to OR.  -wound vac with good seal -further plan per Dr. Magda -will need SNF at discharge   Lucie Apt, PA-C [facility] [phone] 01/26/2023 6:59 AM

## 2023-01-26 NOTE — Progress Notes (Signed)
 Occupational Therapy Treatment Patient Details Name: James Dougherty MRN: [id number] DOB: August 02, 1950 Today's Date: 01/26/2023   History of present illness 72 y.o. male presents to [facility] 01/22/23 from [facility] rehab for post-op evaluation of recent L AKA (11/15) w/ concern for infection and severe sepsis. S/p I&D of L AKA and application of wound vac 12/27. s/pr I&D and wound vac change 12/30. Past medical history of arthritis, hyperlipidemia, hypertension, neuropathic pain, PAD, psoriasis, stroke, diabetes, CKD 3B, amputation R great toe 2019, R ICA stenosis   OT comments  OT and PT co-tx performed to attempt to progress OOB activity. Pt continues to require increased physical assist for all mobility. Sat on EOB to eat lunch without difficulty. Attempted to use Stedy to achieve sit to stand although even though sitting surface was elevated, pt was unable to clear buttocks from mattress and was assisted back into bed.       If plan is discharge home, recommend the following:  Direct supervision/assist for medications management;Assist for transportation;Two people to help with walking and/or transfers;Two people to help with bathing/dressing/bathroom   Equipment Recommendations  None recommended by OT       Precautions / Restrictions Precautions Precautions: Fall Precaution Comments: L AKA, wound vac Restrictions Weight Bearing Restrictions Per Provider Order: Yes LLE Weight Bearing Per Provider Order: Non weight bearing       Mobility Bed Mobility Overal bed mobility: Needs Assistance Bed Mobility: Sit to Supine       Sit to supine: Max assist, Used rails, +2 for physical assistance   General bed mobility comments: MaxAx2 for LE management and trunk elevation using helicopter method. MaxAx2 for lateral scoots towards [facility] w/ use of bed pad    Transfers Overall transfer level: Needs assistance   Transfers: Sit to/from Stand      General transfer comment: Attempted sit to stand X3  while seated at EOB using stedy to assist. Bed elevated. Pt unable to complete sit to stand wtih 2 person max-total A.     Balance Overall balance assessment: Needs assistance Sitting-balance support: No upper extremity supported, Feet supported Sitting balance-Leahy Scale: Poor Sitting balance - Comments: able to sit on EOB without UE support, has poor posture, poor trunk/neck ROM. Required increased time to adjust sitting posture and gain control of static sitting balance. Initially pushed hard posteriorly requiring max A to maintain sitting balance and transitioned to SBA. Postural control: Posterior lean     Standing balance comment: N/A        ADL either performed or assessed with clinical judgement   ADL   Eating/Feeding: Sitting;Modified independent (sitting EOB)   Grooming: Wash/dry hands;Set up;Sitting               Cognition Arousal: Alert Behavior During Therapy: WFL for tasks assessed/performed Overall Cognitive Status: Within Functional Limits for tasks assessed                General Comments VSS on RA. Oxygen removed. pt SpO2 remained at 98% with and without supplemental O2. O2 replaced at end of session.    Pertinent Vitals/ Pain       Pain Assessment Pain Assessment: No/denies pain         Frequency  Min 1X/week        Progress Toward Goals  OT Goals(current goals can now be found in the care plan section)  Progress towards OT goals: Progressing toward goals         Co-evaluation  PT/OT/SLP Co-Evaluation/Treatment: Yes Reason for Co-Treatment: Complexity of the patient's impairments (multi-system involvement);For patient/therapist safety;To address functional/ADL transfers PT goals addressed during session: Mobility/safety with mobility;Balance OT goals addressed during session: ADL's and self-care;Strengthening/ROM;Proper use of Adaptive equipment and DME      AM-PAC OT 6 Clicks Daily Activity     Outcome Measure   Help  from another person eating meals?: None Help from another person taking care of personal grooming?: A Little Help from another person toileting, which includes using toliet, bedpan, or urinal?: A Lot Help from another person bathing (including washing, rinsing, drying)?: A Lot Help from another person to put on and taking off regular upper body clothing?: A Little Help from another person to put on and taking off regular lower body clothing?: A Lot 6 Click Score: 16    End of Session Equipment Utilized During Treatment: Gait belt;Other (comment) Laurent)  OT Visit Diagnosis: Unsteadiness on feet (R26.81);Other abnormalities of gait and mobility (R26.89);Muscle weakness (generalized) (M62.81);Pain Pain - Right/Left: Left Pain - part of body: Leg   Activity Tolerance Patient tolerated treatment well   Patient Left in bed;with call bell/phone within reach;with bed alarm set   Nurse Communication          Time: 8847-8767 OT Time Calculation (min): 40 min  Charges: OT General Charges $OT Visit: 1 Visit OT Treatments $Self Care/Home Management : 8-22 mins  Leita Howell, OTR/L,CBIS  Supplemental OT - [facility] and [facility] Secure Chat Preferred    Deannah Rossi, Leita BIRCH 01/26/2023, 1:57 PM

## 2023-01-26 NOTE — Progress Notes (Signed)
 Physical Therapy Treatment Patient Details Name: James Dougherty MRN: [id number] DOB: 11-02-1950 Today's Date: 01/26/2023   History of Present Illness 72 y.o. male presents to [facility] 01/22/23 from [facility] rehab for post-op evaluation of recent L AKA (11/15) w/ concern for infection and severe sepsis. S/p I&D of L AKA and application of wound vac 12/27. s/pr I&D and wound vac change 12/30. Past medical history of arthritis, hyperlipidemia, hypertension, neuropathic pain, PAD, psoriasis, stroke, diabetes, CKD 3B, amputation R great toe 2019, R ICA stenosis    PT Comments  Attempted to complete Trenda Craze again however pt unable to tolerate laying flat and trying to return to sitting stating I'm going to get sick. Suspect pt with posterior canal BPPV however despite education provided to patient on what it is and how to attempt to fix it pt unable to tolerate. Pt states he sleeps in a recliner or with the HOB elevated. PT then co-tx with OT for mobility. Pt continues to require maxAX2 for bed mobility. Attempted to stand in stedy however x3 however unable despite bed raised and max/totalAX2. Pt to remain appropriate for inpatient rehab < 3 hrs/day. Acute PT to cont to follow.    If plan is discharge home, recommend the following: A lot of help with walking and/or transfers;A lot of help with bathing/dressing/bathroom;Direct supervision/assist for medications management;Direct supervision/assist for financial management;Assist for transportation;Help with stairs or ramp for entrance   Can travel by private vehicle     No  Equipment Recommendations  Other (comment) (TBD at next venue)    Recommendations for Other Services       Precautions / Restrictions Precautions Precautions: Fall Precaution Comments: L AKA, wound vac Restrictions Weight Bearing Restrictions Per Provider Order: Yes LLE Weight Bearing Per Provider Order: Non weight bearing     Mobility  Bed Mobility Overal bed mobility:  Needs Assistance Bed Mobility: Sit to Supine, Sidelying to Sit     Supine to sit: Max assist, HOB elevated, Used rails Sit to supine: Max assist, Used rails, +2 for physical assistance   General bed mobility comments: MaxAx2 for LE management and trunk elevation using helicopter method. MaxAx2 for lateral scoots towards [facility] w/ use of bed pad    Transfers Overall transfer level: Needs assistance Equipment used:  (stedy) Transfers: Sit to/from Stand Sit to Stand: Max assist (unable to achieve full stand)           General transfer comment: Attempted sit to stand X3 while seated at EOB using stedy to assist. Bed elevated. Pt unable to complete sit to stand wtih 2 person max-total A.    Ambulation/Gait                   Stairs             Wheelchair Mobility     Tilt Bed    Modified Rankin (Stroke Patients Only)       Balance Overall balance assessment: Needs assistance Sitting-balance support: No upper extremity supported, Feet supported Sitting balance-Leahy Scale: Fair Sitting balance - Comments: able to sit on EOB without UE support with increased neck and trunk flexion. Required increased time to adjust sitting posture and gain control of static sitting balance. Initially pushed hard posteriorly requiring max A to maintain sitting balance but progressed to SBA and was able to eat lunch while sitting on EOB without LOB Postural control: Posterior lean     Standing balance comment: N/A  Cognition Arousal: Alert Behavior During Therapy: WFL for tasks assessed/performed Overall Cognitive Status: Within Functional Limits for tasks assessed                                 General Comments: pt very HOH but gives great effort        Exercises      General Comments General comments (skin integrity, edema, etc.): VSS on RA      Pertinent Vitals/Pain Pain Assessment Pain Assessment: No/denies  pain    Home Living                          Prior Function            PT Goals (current goals can now be found in the care plan section) Acute Rehab PT Goals Patient Stated Goal: to get stronger PT Goal Formulation: With patient Time For Goal Achievement: 02/06/23 Potential to Achieve Goals: Good Progress towards PT goals: Progressing toward goals    Frequency    Min 1X/week      PT Plan      Co-evaluation PT/OT/SLP Co-Evaluation/Treatment: Yes Reason for Co-Treatment: Complexity of the patient's impairments (multi-system involvement);For patient/therapist safety;To address functional/ADL transfers PT goals addressed during session: Mobility/safety with mobility;Balance OT goals addressed during session: ADL's and self-care;Strengthening/ROM;Proper use of Adaptive equipment and DME      AM-PAC PT 6 Clicks Mobility   Outcome Measure  Help needed turning from your back to your side while in a flat bed without using bedrails?: A Lot Help needed moving from lying on your back to sitting on the side of a flat bed without using bedrails?: Total Help needed moving to and from a bed to a chair (including a wheelchair)?: Total Help needed standing up from a chair using your arms (e.g., wheelchair or bedside chair)?: Total Help needed to walk in hospital room?: Total Help needed climbing 3-5 steps with a railing? : Total 6 Click Score: 7    End of Session Equipment Utilized During Treatment: Gait belt Activity Tolerance: Patient tolerated treatment well Patient left: in bed;with call bell/phone within reach;with bed alarm set Nurse Communication: Mobility status PT Visit Diagnosis: Muscle weakness (generalized) (M62.81);Other abnormalities of gait and mobility (R26.89)     Time: 8860-8768 PT Time Calculation (min) (ACUTE ONLY): 52 min  Charges:    $Therapeutic Activity: 23-37 mins PT General Charges $$ ACUTE PT VISIT: 1 Visit                      Norene Ames, PT, DPT Acute Rehabilitation Services Secure chat preferred Office #: [phone]    Norene CHRISTELLA Ames 01/26/2023, 2:23 PM

## 2023-01-26 NOTE — Plan of Care (Signed)
   Problem: Education: Goal: Knowledge of General Education information will improve Description Including pain rating scale, medication(s)/side effects and non-pharmacologic comfort measures Outcome: Progressing   Problem: Health Behavior/Discharge Planning: Goal: Ability to manage health-related needs will improve Outcome: Progressing

## 2023-01-26 NOTE — Progress Notes (Signed)
 PROGRESS NOTE  James Dougherty FMW:[id number] DOB: 09-02-1950 DOA: 01/22/2023 PCP: Atilano Deward ORN, MD   LOS: 4 days   Brief Narrative / Interim history: 72 year old male with history of DM2, HTN, HLD, prior CVA, HOH, PAD, right ICA stenosis, CKD, neuropathy, lymphedema comes into the hospital with infected left AKA stump wound. Recently hospitalized 11/15 - 11/22, for nonhealing left lower extremity wound, underwent left AKA and subsequent discharged to subacute rehab. 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  However he did not have any fever or chills and hence did not require hospitalization. 12/27, SNF sent patient to the ED with drainage from the stump, low-grade fever.  He was taken to the OR by vascular surgery on 12/27 s/p  I&D and wound VAC placement, and repeat surgery 12/30  Subjective / 24h Interval events: Doing well, no significant pain.  Assesement and Plan: Principal Problem:   Amputation stump infection (HCC) Active Problems:   Peripheral vascular disease, unspecified (HCC)   Chronic kidney disease   Type II diabetes mellitus with complication (HCC)   Neuropathic pain   S/P AKA (above knee amputation) unilateral (HCC)   Principal problem Severe sepsis due to left AKA stump wound infection -vascular surgery consulted and following, he was taken to the OR on 12/27 status post I&D, repeat surgery 12/30 showing healthy tissue.  Intraoperative culture 2/27 with Staph aureus, susceptibilities still pending today -Vascular surgery will reevaluate the wound tomorrow  Active problems PAD - on aspirin , statin, Plavix   Chronic combined CHF-last echocardiogram in May 2022 he showed LVEF 30-40%, grade 3 diastolic dysfunction.  Blood pressures are soft, continue to hold Lasix   Essential hypertension-continue metoprolol   DM2, poorly controlled, with  hyperglycemia-A1c 8.4, currently on Levemir  and sliding scale.  Diabetic neuropathy-continue medications as below  Prior CVA, right ICA stenosis-continue aspirin  and statin.  Plavix  on hold perioperatively  Obesity, class I-mild, BMI 30.7  CKD 3B-baseline creatinine 1.3-1.5, currently better than baseline  Scheduled Meds:  aspirin  EC  81 mg Oral Daily   atorvastatin   80 mg Oral QHS   Chlorhexidine  Gluconate Cloth  6 each Topical Q0600   enoxaparin  (LOVENOX ) injection  40 mg Subcutaneous Q24H   gabapentin   600 mg Oral TID   insulin  aspart  0-15 Units Subcutaneous TID WC   insulin  aspart  0-5 Units Subcutaneous QHS   insulin  glargine-yfgn  10 Units Subcutaneous QHS   metoprolol  succinate  50 mg Oral Daily   mupirocin  ointment  1 Application Nasal BID   senna-docusate  1 tablet Oral Daily   Continuous Infusions:  ceFEPime  (MAXIPIME ) IV 2 g (01/26/23 0832)   vancomycin  250 mL/hr at 01/25/23 2240   PRN Meds:.acetaminophen  **OR** acetaminophen , hydrALAZINE , HYDROcodone -acetaminophen , morphine  injection, ondansetron  (ZOFRAN ) IV, polyethylene glycol  Current Outpatient Medications  Medication Instructions   aspirin  EC 81 mg, Daily   atorvastatin  (LIPITOR ) 80 mg, Oral, Daily at bedtime   Basaglar  KwikPen 21 Units, Subcutaneous, Daily at bedtime   bisacodyl  (DULCOLAX) 5 mg, Daily PRN   clopidogrel  (PLAVIX ) 75 mg, Oral, Daily   empagliflozin  (JARDIANCE ) 10 mg, Oral, Daily   furosemide  (LASIX ) 20 mg, Oral, Daily   gabapentin  (NEURONTIN ) 600 mg, 3 times daily   insulin  aspart (NOVOLOG ) 0-15 Units, Subcutaneous, 3 times daily with meals   insulin  aspart (NOVOLOG ) 0-5 Units, Subcutaneous, Daily at bedtime   Melatonin 3 mg, At bedtime PRN  Menthol-Zinc Oxide (CALMOSEPTINE EX) 1 Application, 2 times daily   MENTHOL-ZINC OXIDE EX 1 Application, 4 times daily PRN   metoprolol  succinate (TOPROL -XL) 50 mg, Daily   polyethylene glycol (MIRALAX  / GLYCOLAX ) 17 g, Oral, Daily PRN   Protein POWD  6 g, 3 times daily   senna-docusate (SENOKOT-S) 8.6-50 MG tablet 1 tablet, Oral, Daily   traMADol  (ULTRAM ) 50 mg, Oral, Every 6 hours PRN   Trulicity  1.5 mg, Weekly    Diet Orders (From admission, onward)     Start     Ordered   01/25/23 1312  Diet heart healthy/carb modified Room service appropriate? Yes; Fluid consistency: Thin  Diet effective now       Question Answer Comment  Diet-HS Snack? Nothing   Room service appropriate? Yes   Fluid consistency: Thin      01/25/23 1311            DVT prophylaxis: enoxaparin  (LOVENOX ) injection 40 mg Start: 01/23/23 2200   Lab Results  Component Value Date   PLT 329 01/26/2023      Code Status: Full Code  Family Communication: no family at bedside   Status is: Inpatient Remains inpatient appropriate because: severity of illness  Level of care: Med-Surg  Consultants:  Vascular surgery  Objective: Vitals:   01/25/23 2115 01/26/23 0000 01/26/23 0543 01/26/23 0736  BP: 108/79 102/85 110/61 106/70  Pulse: 94 83 77 87  Resp: 19 17 18 14   Temp: 97.6 F (36.4 C) 97.8 F (36.6 C) (!) 97.5 F (36.4 C) 97.6 F (36.4 C)  TempSrc:  Oral Oral Oral  SpO2: 93% 94% 99% 96%  Weight:      Height:        Intake/Output Summary (Last 24 hours) at 01/26/2023 1106 Last data filed at 01/25/2023 2350 Gross per 24 hour  Intake 1166.73 ml  Output 1460 ml  Net -293.27 ml   Wt Readings from Last 3 Encounters:  01/25/23 89.8 kg  01/18/23 92.5 kg  12/18/22 92.2 kg    Examination:  Constitutional: NAD Eyes: lids and conjunctivae normal, no scleral icterus ENMT: mmm Neck: normal, supple Respiratory: clear to auscultation bilaterally, no wheezing, no crackles. Normal respiratory effort.  Cardiovascular: Regular rate and rhythm, no murmurs / rubs / gallops. No LE edema. Abdomen: soft, no distention, no tenderness. Bowel sounds positive.   Data Reviewed: I have independently reviewed following labs and imaging studies    CBC Recent Labs  Lab 01/22/23 1300 01/23/23 0534 01/24/23 0448 01/25/23 0528 01/26/23 0509  WBC 12.0* 12.3* 9.1 7.3 7.8  HGB 14.4 13.5 12.2* 12.5* 12.6*  HCT 44.3 42.1 37.2* 38.9* 37.3*  PLT 226 198 195 216 329  MCV 96.1 96.1 95.9 96.8 95.9  MCH 31.2 30.8 31.4 31.1 32.4  MCHC 32.5 32.1 32.8 32.1 33.8  RDW 14.5 14.3 14.5 14.7 15.0  LYMPHSABS 1.8  --  1.1 1.5  --   MONOABS 1.6*  --  0.9 0.9  --   EOSABS 0.2  --  0.8* 1.0*  --   BASOSABS 0.1  --  0.1 0.1  --     Recent Labs  Lab 01/22/23 1300 01/22/23 1324 01/23/23 0534 01/24/23 0448 01/25/23 0528 01/26/23 0509  NA 132*  --  137 137 138 140  K 4.4  --  4.4 3.9 3.9 4.3  CL 94*  --  102 105 107 111  CO2 23  --  22 23 22  20*  GLUCOSE 130*  --  177* 119*  98 139*  BUN 23  --  27* 33* 26* 21  CREATININE 1.58*  --  1.51* 1.31* 1.21 1.01  CALCIUM  8.5*  --  8.2* 7.9* 8.1* 8.1*  AST 39  --   --   --   --  32  ALT 24  --   --   --   --  30  ALKPHOS 86  --   --   --   --  68  BILITOT 1.5*  --   --   --   --  0.5  ALBUMIN  2.7*  --   --   --   --  2.2*  MG  --   --   --   --   --  2.3  LATICACIDVEN  --  2.7*  --  1.2  --   --     ------------------------------------------------------------------------------------------------------------------ No results for input(s): CHOL, HDL, LDLCALC, TRIG, CHOLHDL, LDLDIRECT in the last 72 hours.  Lab Results  Component Value Date   HGBA1C 8.4 (H) 12/11/2022   ------------------------------------------------------------------------------------------------------------------ No results for input(s): TSH, T4TOTAL, T3FREE, THYROIDAB in the last 72 hours.  Invalid input(s): FREET3  Cardiac Enzymes No results for input(s): CKMB, TROPONINI, MYOGLOBIN in the last 168 hours.  Invalid input(s): CK ------------------------------------------------------------------------------------------------------------------ No results found for: BNP  CBG: Recent Labs   Lab 01/25/23 1122 01/25/23 1223 01/25/23 1809 01/25/23 2119 01/26/23 0546  GLUCAP 87 78 182* 200* 148*    Recent Results (from the past 240 hours)  Blood Cultures x 2 sites     Status: None (Preliminary result)   Collection Time: 01/22/23 12:40 PM   Specimen: BLOOD  Result Value Ref Range Status   Specimen Description BLOOD SITE NOT SPECIFIED  Final   Special Requests   Final    BOTTLES DRAWN AEROBIC AND ANAEROBIC Blood Culture results may not be optimal due to an inadequate volume of blood received in culture bottles   Culture   Final    NO GROWTH 4 DAYS Performed at [facility], [address] [place], [place] [address]    Report Status PENDING  Incomplete  Blood Cultures x 2 sites     Status: None (Preliminary result)   Collection Time: 01/22/23  1:00 PM   Specimen: BLOOD  Result Value Ref Range Status   Specimen Description BLOOD SITE NOT SPECIFIED  Final   Special Requests   Final    BOTTLES DRAWN AEROBIC AND ANAEROBIC Blood Culture results may not be optimal due to an inadequate volume of blood received in culture bottles   Culture   Final    NO GROWTH 4 DAYS Performed at [facility], [address] [place], [place] [address]    Report Status PENDING  Incomplete  Surgical pcr screen     Status: Abnormal   Collection Time: 01/22/23  2:12 PM   Specimen: Nasal Mucosa; Nasal Swab  Result Value Ref Range Status   MRSA, PCR POSITIVE (A) NEGATIVE Final    Comment: RESULT CALLED TO, READ BACK BY AND VERIFIED WITH: RN L. STAMPS [id number] @1804  FH    Staphylococcus aureus POSITIVE (A) NEGATIVE Final    Comment: (NOTE) The Xpert SA Assay (FDA approved for NASAL specimens in patients 42 years of age and older), is one component of a comprehensive surveillance program. It is not intended to diagnose infection nor to guide or monitor treatment. Performed at [facility], [address] [place], [place] [address]   Aerobic/Anaerobic  Culture w Gram Stain  (surgical/deep wound)     Status: None (Preliminary result)   Collection Time: 01/22/23  3:51 PM   Specimen: Wound  Result Value Ref Range Status   Specimen Description WOUND  Final   Special Requests ABOVE LEFT KNEE AMPUTATION  Final   Gram Stain   Final    ABUNDANT WBC PRESENT, PREDOMINANTLY PMN RARE GRAM POSITIVE COCCI IN PAIRS Performed at [facility], [address] [place], [place] [address]    Culture   Final    ABUNDANT STAPHYLOCOCCUS AUREUS CONFIRMATION OF SUSCEPTIBILITIES IN PROGRESS NO ANAEROBES ISOLATED; CULTURE IN PROGRESS FOR 5 DAYS    Report Status PENDING  Incomplete     Radiology Studies: No results found.   Nilda Fendt, MD, PhD [facility]  Between 7 am - 7 pm I am available, please contact me via Amion (for emergencies) or Securechat (non urgent messages)  Between 7 pm - 7 am I am not available, please contact night coverage MD/APP via Amion

## 2023-01-26 NOTE — Plan of Care (Signed)

## 2023-01-27 DIAGNOSIS — T8740 Infection of amputation stump, unspecified extremity: Secondary | ICD-10-CM | POA: Diagnosis not present

## 2023-01-27 LAB — CULTURE, BLOOD (ROUTINE X 2)
Culture: NO GROWTH
Culture: NO GROWTH

## 2023-01-27 LAB — GLUCOSE, CAPILLARY
Glucose-Capillary: 113 mg/dL — ABNORMAL HIGH (ref 70–99)
Glucose-Capillary: 142 mg/dL — ABNORMAL HIGH (ref 70–99)
Glucose-Capillary: 152 mg/dL — ABNORMAL HIGH (ref 70–99)
Glucose-Capillary: 164 mg/dL — ABNORMAL HIGH (ref 70–99)

## 2023-01-27 NOTE — TOC Progression Note (Signed)
 Transition of Care [facility]) - Progression Note    Patient Details  Name: James Dougherty MRN: [id number] Date of Birth: 1951-01-21  [facility] CM/SW Contact  Bridget Cordella Simmonds, LCSW Phone Number: 01/27/2023, 12:14 PM  Clinical Narrative:   CSW spoke with pt daughter James Dougherty who confirmed plan for pt to DC back to [name] when ready.  Pt is STR but has been there around 3 months already--they are aware that medicare coverage ends at 100 days, report pt has LTC medicaid (which is confirmed in epic) and that they have been told this will cover once medicare benefit is exhausted.  James Dougherty is planning to talk with [facility] about issues related to the care that may have contributed to infection.  TOC will continue to follow.      Expected Discharge Plan: Skilled Nursing Facility Barriers to Discharge: Continued Medical Work up  Expected Discharge Plan and Services In-house Referral: Clinical Social Work     Living arrangements for the past 2 months: Skilled Nursing Facility                                       Social Determinants of Health (SDOH) Interventions SDOH Screenings   Food Insecurity: No Food Insecurity (01/22/2023)  Housing: Unknown (01/22/2023)  Transportation Needs: No Transportation Needs (01/22/2023)  Utilities: Not At Risk (01/22/2023)  Depression (PHQ2-9): Low Risk  (12/03/2021)  Financial Resource Strain: Patient Declined (10/20/2022)   Received from [facility]  Physical Activity: Inactive (10/20/2022)   Received from [facility]  Social Connections: Unknown (01/26/2023)  Stress: No Stress Concern Present (10/20/2022)   Received from [facility]  Tobacco Use: Medium Risk (01/25/2023)  Health Literacy: Medium Risk (12/23/2022)   Received from [facility]    Readmission Risk Interventions     No data to display

## 2023-01-27 NOTE — Plan of Care (Signed)
   Problem: Education: Goal: Knowledge of General Education information will improve Description: Including pain rating scale, medication(s)/side effects and non-pharmacologic comfort measures Outcome: Progressing   Problem: Activity: Goal: Risk for activity intolerance will decrease Outcome: Progressing

## 2023-01-27 NOTE — Progress Notes (Signed)
 PROGRESS NOTE  James Dougherty FMW:[id number] DOB: 1950-08-09 DOA: 01/22/2023 PCP: Atilano Deward ORN, MD   LOS: 5 days   Brief Narrative / Interim history: 73 year old male with history of DM2, HTN, HLD, prior CVA, HOH, PAD, right ICA stenosis, CKD, neuropathy, lymphedema comes into the hospital with infected left AKA stump wound. Recently hospitalized 11/15 - 11/22, for nonhealing left lower extremity wound, underwent left AKA and subsequent discharged to subacute rehab. 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  However he did not have any fever or chills and hence did not require hospitalization. 12/27, SNF sent patient to the ED with drainage from the stump, low-grade fever.  He was taken to the OR by vascular surgery on 12/27 s/p  I&D and wound VAC placement, and repeat surgery 12/30  Subjective / 24h Interval events: Doing well, no significant pain  Assesement and Plan: Principal Problem:   Amputation stump infection (HCC) Active Problems:   Peripheral vascular disease, unspecified (HCC)   Chronic kidney disease   Type II diabetes mellitus with complication (HCC)   Neuropathic pain   S/P AKA (above knee amputation) unilateral (HCC)   Principal problem Severe sepsis due to left AKA stump wound infection -vascular surgery consulted and following, he was taken to the OR on 12/27 status post I&D, repeat surgery 12/30 showing healthy tissue.  Intraoperative culture 2/27 with MRSA -Vascular surgery will reevaluate the wound and change the wound VAC Thursday or Friday  Active problems PAD - on aspirin , statin, Plavix   Chronic combined CHF-last echocardiogram in May 2022 he showed LVEF 30-40%, grade 3 diastolic dysfunction.  Blood pressure soft at times, continue to hold Lasix   Essential hypertension-continue metoprolol   DM2, poorly controlled, with  hyperglycemia-A1c 8.4, currently on Levemir  and sliding scale.  Diabetic neuropathy-continue medications as below  Prior CVA, right ICA stenosis-continue aspirin  and statin.  Plavix  on hold perioperatively  Obesity, class I-mild, BMI 30.7  CKD 3B-baseline creatinine 1.3-1.5, currently better than baseline  Scheduled Meds:  aspirin  EC  81 mg Oral Daily   atorvastatin   80 mg Oral QHS   enoxaparin  (LOVENOX ) injection  40 mg Subcutaneous Q24H   gabapentin   600 mg Oral TID   insulin  aspart  0-15 Units Subcutaneous TID WC   insulin  aspart  0-5 Units Subcutaneous QHS   insulin  glargine-yfgn  10 Units Subcutaneous QHS   metoprolol  succinate  50 mg Oral Daily   senna-docusate  1 tablet Oral Daily   Continuous Infusions:  vancomycin  HCl 1,500 mg (01/26/23 2210)   PRN Meds:.acetaminophen  **OR** acetaminophen , hydrALAZINE , HYDROcodone -acetaminophen , morphine  injection, ondansetron  (ZOFRAN ) IV, polyethylene glycol  Current Outpatient Medications  Medication Instructions   aspirin  EC 81 mg, Daily   atorvastatin  (LIPITOR ) 80 mg, Oral, Daily at bedtime   Basaglar  KwikPen 21 Units, Subcutaneous, Daily at bedtime   bisacodyl  (DULCOLAX) 5 mg, Daily PRN   clopidogrel  (PLAVIX ) 75 mg, Oral, Daily   empagliflozin  (JARDIANCE ) 10 mg, Oral, Daily   furosemide  (LASIX ) 20 mg, Oral, Daily   gabapentin  (NEURONTIN ) 600 mg, 3 times daily   insulin  aspart (NOVOLOG ) 0-15 Units, Subcutaneous, 3 times daily with meals   insulin  aspart (NOVOLOG ) 0-5 Units, Subcutaneous, Daily at bedtime   Melatonin 3 mg, At bedtime PRN   Menthol-Zinc Oxide (CALMOSEPTINE EX) 1 Application, 2 times daily   MENTHOL-ZINC OXIDE EX 1 Application, 4 times daily PRN   metoprolol   succinate (TOPROL -XL) 50 mg, Daily   polyethylene glycol (MIRALAX  / GLYCOLAX ) 17 g, Oral, Daily PRN   Protein POWD 6 g, 3 times daily   senna-docusate (SENOKOT-S) 8.6-50 MG tablet 1 tablet, Oral, Daily   traMADol  (ULTRAM ) 50 mg, Oral, Every 6 hours PRN    Trulicity  1.5 mg, Weekly    Diet Orders (From admission, onward)     Start     Ordered   01/25/23 1312  Diet heart healthy/carb modified Room service appropriate? Yes; Fluid consistency: Thin  Diet effective now       Question Answer Comment  Diet-HS Snack? Nothing   Room service appropriate? Yes   Fluid consistency: Thin      01/25/23 1311            DVT prophylaxis: enoxaparin  (LOVENOX ) injection 40 mg Start: 01/23/23 2200   Lab Results  Component Value Date   PLT 329 01/26/2023      Code Status: Full Code  Family Communication: no family at bedside   Status is: Inpatient Remains inpatient appropriate because: severity of illness  Level of care: Med-Surg  Consultants:  Vascular surgery  Objective: Vitals:   01/26/23 1303 01/26/23 2030 01/27/23 0400 01/27/23 0939  BP: (!) 110/53 (!) 95/54 108/68 122/65  Pulse: 71 80 74 84  Resp: 16 15 13 16   Temp: 98 F (36.7 C) (!) 97.5 F (36.4 C) 98 F (36.7 C) 97.9 F (36.6 C)  TempSrc: Oral Oral Oral Axillary  SpO2: 95% 100% 99% 95%  Weight:      Height:        Intake/Output Summary (Last 24 hours) at 01/27/2023 1049 Last data filed at 01/27/2023 0942 Gross per 24 hour  Intake 240 ml  Output 1765 ml  Net -1525 ml   Wt Readings from Last 3 Encounters:  01/25/23 89.8 kg  01/18/23 92.5 kg  12/18/22 92.2 kg    Examination:  Constitutional: NAD Eyes: lids and conjunctivae normal, no scleral icterus ENMT: mmm Neck: normal, supple Respiratory: clear to auscultation bilaterally, no wheezing, no crackles. Normal respiratory effort.  Cardiovascular: Regular rate and rhythm, no murmurs / rubs / gallops. No LE edema. Abdomen: soft, no distention, no tenderness. Bowel sounds positive.   Data Reviewed: I have independently reviewed following labs and imaging studies   CBC Recent Labs  Lab 01/22/23 1300 01/23/23 0534 01/24/23 0448 01/25/23 0528 01/26/23 0509  WBC 12.0* 12.3* 9.1 7.3 7.8  HGB 14.4 13.5  12.2* 12.5* 12.6*  HCT 44.3 42.1 37.2* 38.9* 37.3*  PLT 226 198 195 216 329  MCV 96.1 96.1 95.9 96.8 95.9  MCH 31.2 30.8 31.4 31.1 32.4  MCHC 32.5 32.1 32.8 32.1 33.8  RDW 14.5 14.3 14.5 14.7 15.0  LYMPHSABS 1.8  --  1.1 1.5  --   MONOABS 1.6*  --  0.9 0.9  --   EOSABS 0.2  --  0.8* 1.0*  --   BASOSABS 0.1  --  0.1 0.1  --     Recent Labs  Lab 01/22/23 1300 01/22/23 1324 01/23/23 0534 01/24/23 0448 01/25/23 0528 01/26/23 0509  NA 132*  --  137 137 138 140  K 4.4  --  4.4 3.9 3.9 4.3  CL 94*  --  102 105 107 111  CO2 23  --  22 23 22  20*  GLUCOSE 130*  --  177* 119* 98 139*  BUN 23  --  27* 33* 26* 21  CREATININE 1.58*  --  1.51* 1.31* 1.21  1.01  CALCIUM  8.5*  --  8.2* 7.9* 8.1* 8.1*  AST 39  --   --   --   --  32  ALT 24  --   --   --   --  30  ALKPHOS 86  --   --   --   --  68  BILITOT 1.5*  --   --   --   --  0.5  ALBUMIN  2.7*  --   --   --   --  2.2*  MG  --   --   --   --   --  2.3  LATICACIDVEN  --  2.7*  --  1.2  --   --     ------------------------------------------------------------------------------------------------------------------ No results for input(s): CHOL, HDL, LDLCALC, TRIG, CHOLHDL, LDLDIRECT in the last 72 hours.  Lab Results  Component Value Date   HGBA1C 8.4 (H) 12/11/2022   ------------------------------------------------------------------------------------------------------------------ No results for input(s): TSH, T4TOTAL, T3FREE, THYROIDAB in the last 72 hours.  Invalid input(s): FREET3  Cardiac Enzymes No results for input(s): CKMB, TROPONINI, MYOGLOBIN in the last 168 hours.  Invalid input(s): CK ------------------------------------------------------------------------------------------------------------------ No results found for: BNP  CBG: Recent Labs  Lab 01/26/23 0546 01/26/23 1107 01/26/23 1556 01/26/23 2002 01/27/23 0601  GLUCAP 148* 185* 122* 123* 113*    Recent Results (from the  past 240 hours)  Blood Cultures x 2 sites     Status: None   Collection Time: 01/22/23 12:40 PM   Specimen: BLOOD  Result Value Ref Range Status   Specimen Description BLOOD SITE NOT SPECIFIED  Final   Special Requests   Final    BOTTLES DRAWN AEROBIC AND ANAEROBIC Blood Culture results may not be optimal due to an inadequate volume of blood received in culture bottles   Culture   Final    NO GROWTH 5 DAYS Performed at [facility], [address], [place], [place] [address]    Report Status 01/27/2023 FINAL  Final  Blood Cultures x 2 sites     Status: None   Collection Time: 01/22/23  1:00 PM   Specimen: BLOOD  Result Value Ref Range Status   Specimen Description BLOOD SITE NOT SPECIFIED  Final   Special Requests   Final    BOTTLES DRAWN AEROBIC AND ANAEROBIC Blood Culture results may not be optimal due to an inadequate volume of blood received in culture bottles   Culture   Final    NO GROWTH 5 DAYS Performed at [facility], [address] [place], [place] [address]    Report Status 01/27/2023 FINAL  Final  Surgical pcr screen     Status: Abnormal   Collection Time: 01/22/23  2:12 PM   Specimen: Nasal Mucosa; Nasal Swab  Result Value Ref Range Status   MRSA, PCR POSITIVE (A) NEGATIVE Final    Comment: RESULT CALLED TO, READ BACK BY AND VERIFIED WITH: RN FREDRIK SHEETS [id number] @1804  FH    Staphylococcus aureus POSITIVE (A) NEGATIVE Final    Comment: (NOTE) The Xpert SA Assay (FDA approved for NASAL specimens in patients 75 years of age and older), is one component of a comprehensive surveillance program. It is not intended to diagnose infection nor to guide or monitor treatment. Performed at [facility], [address], [place], [place] [address]   Aerobic/Anaerobic Culture w Gram Stain (surgical/deep wound)     Status: None   Collection Time: 01/22/23  3:51 PM   Specimen:  Wound  Result Value Ref Range Status   Specimen Description WOUND  Final   Special  Requests ABOVE LEFT KNEE AMPUTATION  Final   Gram Stain   Final    ABUNDANT WBC PRESENT, PREDOMINANTLY PMN RARE GRAM POSITIVE COCCI IN PAIRS    Culture   Final    ABUNDANT METHICILLIN RESISTANT STAPHYLOCOCCUS AUREUS NO ANAEROBES ISOLATED Performed at [facility], [address], [place], [place] [address]    Report Status 01/27/2023 FINAL  Final   Organism ID, Bacteria METHICILLIN RESISTANT STAPHYLOCOCCUS AUREUS  Final      Susceptibility   Methicillin resistant staphylococcus aureus - MIC*    CIPROFLOXACIN >=8 RESISTANT Resistant     ERYTHROMYCIN >=8 RESISTANT Resistant     GENTAMICIN <=0.5 SENSITIVE Sensitive     OXACILLIN >=4 RESISTANT Resistant     TETRACYCLINE >=16 RESISTANT Resistant     VANCOMYCIN  <=0.5 SENSITIVE Sensitive     TRIMETH/SULFA <=10 SENSITIVE Sensitive     CLINDAMYCIN RESISTANT Resistant     RIFAMPIN <=0.5 SENSITIVE Sensitive     Inducible Clindamycin POSITIVE Resistant     LINEZOLID 4 SENSITIVE Sensitive     * ABUNDANT METHICILLIN RESISTANT STAPHYLOCOCCUS AUREUS     Radiology Studies: No results found.   Nilda Fendt, MD, PhD [facility]  Between 7 am - 7 pm I am available, please contact me via Amion (for emergencies) or Securechat (non urgent messages)  Between 7 pm - 7 am I am not available, please contact night coverage MD/APP via Amion

## 2023-01-27 NOTE — Progress Notes (Signed)
    Patient resting comfortably on my exam today.  Wound VAC is to suction.  Plan will be for bedside change as of Thursday or Friday of this week.  Nakya Weyand C. Sheree, MD [facility] [place] Office: [phone] Pager: [phone]

## 2023-01-28 DIAGNOSIS — M792 Neuralgia and neuritis, unspecified: Secondary | ICD-10-CM

## 2023-01-28 DIAGNOSIS — A4902 Methicillin resistant Staphylococcus aureus infection, unspecified site: Secondary | ICD-10-CM | POA: Diagnosis not present

## 2023-01-28 DIAGNOSIS — T8740 Infection of amputation stump, unspecified extremity: Secondary | ICD-10-CM | POA: Diagnosis not present

## 2023-01-28 DIAGNOSIS — T8744 Infection of amputation stump, left lower extremity: Principal | ICD-10-CM

## 2023-01-28 DIAGNOSIS — I739 Peripheral vascular disease, unspecified: Secondary | ICD-10-CM | POA: Diagnosis not present

## 2023-01-28 LAB — CK: Total CK: 30 U/L — ABNORMAL LOW (ref 49–397)

## 2023-01-28 LAB — GLUCOSE, CAPILLARY
Glucose-Capillary: 96 mg/dL (ref 70–99)
Glucose-Capillary: 150 mg/dL — ABNORMAL HIGH (ref 70–99)
Glucose-Capillary: 170 mg/dL — ABNORMAL HIGH (ref 70–99)
Glucose-Capillary: 185 mg/dL — ABNORMAL HIGH (ref 70–99)

## 2023-01-28 LAB — HEPATITIS PANEL, ACUTE
HCV Ab: NONREACTIVE
Hep A IgM: NONREACTIVE
Hep B C IgM: NONREACTIVE
Hepatitis B Surface Ag: NONREACTIVE

## 2023-01-28 LAB — HIV ANTIBODY (ROUTINE TESTING W REFLEX): HIV Screen 4th Generation wRfx: NONREACTIVE

## 2023-01-28 LAB — C-REACTIVE PROTEIN: CRP: 1.1 mg/dL — ABNORMAL HIGH (ref <1.0)

## 2023-01-28 LAB — SEDIMENTATION RATE: Sed Rate: 19 mm/h — ABNORMAL HIGH (ref 0–16)

## 2023-01-28 MED ORDER — CLOPIDOGREL BISULFATE 75 MG PO TABS
75.0000 mg | ORAL_TABLET | Freq: Every day | ORAL | Status: DC
  Administered 2023-01-28 – 2023-01-31 (×4): 75 mg via ORAL
  Filled 2023-01-28 (×5): qty 1

## 2023-01-28 MED ORDER — GLYCERIN (LAXATIVE) 2 G RE SUPP
1.0000 | Freq: Once | RECTAL | Status: AC
  Administered 2023-01-28: 1 via RECTAL
  Filled 2023-01-28: qty 1

## 2023-01-28 NOTE — Progress Notes (Addendum)
 PHARMACY CONSULT NOTE FOR:  OUTPATIENT  PARENTERAL ANTIBIOTIC THERAPY (OPAT)  Indication: L AKA stump infection / osteomyelitis Regimen: Daptomycin  700 mg IV daily End date: 03/08/23 (6 weeks from 12/30 I&D)  IV antibiotic discharge orders are pended. To discharging provider:  please sign these orders via discharge navigator,  Select New Orders & click on the button choice - Manage This Unsigned Work.     Thank you for allowing pharmacy to be a part of this patient's care.  Con RAMAN Loris Seelye 01/28/2023, 1:14 PM

## 2023-01-28 NOTE — Progress Notes (Addendum)
 Progress Note    01/28/2023 6:53 AM 3 Days Post-Op  Subjective:  sleeping, I did not wake him.  afebrile  Vitals:   01/27/23 1431 01/27/23 1959  BP: 116/61 125/65  Pulse: 88 84  Resp:  18  Temp: 97.6 F (36.4 C) 97.9 F (36.6 C)  SpO2: 100% 98%    Physical Exam: General:  no distress Lungs:  non labored     CBC    Component Value Date/Time   WBC 7.8 01/26/2023 0509   RBC 3.89 (L) 01/26/2023 0509   HGB 12.6 (L) 01/26/2023 0509   HCT 37.3 (L) 01/26/2023 0509   PLT 329 01/26/2023 0509   MCV 95.9 01/26/2023 0509   MCH 32.4 01/26/2023 0509   MCHC 33.8 01/26/2023 0509   RDW 15.0 01/26/2023 0509   LYMPHSABS 1.5 01/25/2023 0528   MONOABS 0.9 01/25/2023 0528   EOSABS 1.0 (H) 01/25/2023 0528   BASOSABS 0.1 01/25/2023 0528    BMET    Component Value Date/Time   NA 140 01/26/2023 0509   K 4.3 01/26/2023 0509   CL 111 01/26/2023 0509   CO2 20 (L) 01/26/2023 0509   GLUCOSE 139 (H) 01/26/2023 0509   BUN 21 01/26/2023 0509   CREATININE 1.01 01/26/2023 0509   CALCIUM  8.1 (L) 01/26/2023 0509   GFRNONAA >60 01/26/2023 0509   GFRAA 47 (L) 04/28/2017 0238    INR    Component Value Date/Time   INR 1.2 12/11/2022 1105     Intake/Output Summary (Last 24 hours) at 01/28/2023 0653 Last data filed at 01/27/2023 1500 Gross per 24 hour  Intake 300 ml  Output 550 ml  Net -250 ml   Specimen Description WOUND  Special Requests ABOVE LEFT KNEE AMPUTATION  Gram Stain ABUNDANT WBC PRESENT, PREDOMINANTLY PMN RARE GRAM POSITIVE COCCI IN PAIRS  Culture ABUNDANT METHICILLIN RESISTANT STAPHYLOCOCCUS AUREUS NO ANAEROBES ISOLATED Performed at [facility], [address] [place], [place] [address]  Report Status 01/27/2023 FINAL  Organism ID, Bacteria METHICILLIN RESISTANT STAPHYLOCOCCUS AUREUS  Resulting Agency [facility]     Susceptibility   Methicillin resistant staphylococcus aureus    MIC    CIPROFLOXACIN >=8 RESISTANT Resistant    CLINDAMYCIN RESISTANT  Resistant    ERYTHROMYCIN >=8 RESISTANT Resistant    GENTAMICIN <=0.5 SENSI... Sensitive    Inducible Clindamycin POSITIVE Resistant    LINEZOLID 4 SENSITIVE Sensitive    OXACILLIN >=4 RESISTANT Resistant    RIFAMPIN <=0.5 SENSI... Sensitive    TETRACYCLINE >=16 RESIST... Resistant    TRIMETH/SULFA <=10 SENSIT... Sensitive    VANCOMYCIN  <=0.5 SENSI... Sensitive               Assessment/Plan:  73 y.o. male is s/p:  I&D left AKA and wound vac 01/22/2023 and simple partial closure of left AKA and wound vac placement 01/25/2023 both by Dr. Magda.   3 Days Post-Op   -pt currently sleeping and I did not wake him. -pt currently on Vancomycin  for wound infection.  Will d/w Dr. Lanis duration and po med for discharge.  Pt is allergic to Bactrim. -will plan for vac change tomorrow at bedside -will consult ID for 6 weeks of Abx and would benefit from palliative consult for goals of care.    Lucie Apt, PA-C [facility] [phone] 01/28/2023 6:53 AM  VASCULAR STAFF ADDENDUM: I have independently interviewed and examined the patient. I agree with the above.   Doing well this morning.  No complaints.  Pain under  control.  73 year old male now status post left AKA debridement x 2, partial closure with wound VAC for postop infection.  This was single organism MRSA, which is usually consistent with preop infection. Regardless, currently on vancomycin .  I would like him treated for 6 weeks.   Discussed with pharmacy, will reach out to infectious disease today.   Regarding the right lower extremity, he has critical and ischemia with tissue loss at the toes.  He will require angiography, with possible intervention.  The right lower extremity has been imaged before with EIA, SFA stenting.  Stenosis was appreciated in the external iliac artery in August of this year.  Patient will need right lower extremity angiogram, but I would like for him to have a few weeks of  antibiotics prior to intervention in case stent placement is necessary.  With Benny his multiple medical problems, and known small vessel disease, there is a high likelihood that the right leg will end up in above-knee amputation similar to the left.  I think it is reasonable to get a palliative care consult to discuss goals of care moving forward.  Zai also has critical ICA stenosis, which is not being treated due to life expectancy less than 3 years. I plan to call his daughter regarding the above.  Overall vascular plan for his stay: 6 weeks of antibiotics - we have reached out to ID [facility] dressing changes twice weekly -will assess the wound tomorrow.  If it is looking healthy, can work on discharge. Plan for right lower extremity angiography as an outpatient 02/09/2022.   Fonda FORBES Rim MD [facility] Phone Number: [phone] 01/28/2023 8:01 AM

## 2023-01-28 NOTE — Consult Note (Addendum)
 Date of Admission:  01/22/2023          Reason for Consult: infected AKA site with MRSA    Referring Provider: Nilda Fendt, MD and Fonda Rim, MD   Assessment:  AKA site infection with MRSA pVD DM 2 CKD  Screening for HIV, viral hepatides in low risk patient   Plan:  Would complete 6 weeks of postoperative antibiotics with IV daptomycin   ESR, CRP Check HIV, hepatitis panel   Diagnosis: L AKA stump infection / osteomyelitis   AKA site  Culture Result: MRSA  Allergies  Allergen Reactions   Oxycodone  Nausea And Vomiting   Glipizide     Bad headache and blurred vision   Other     Soft silicone dressing caused wound to get worse   Bactrim [Sulfamethoxazole-Trimethoprim] Itching and Rash    OPAT Orders Discharge antibiotics to be given via PICC line Discharge antibiotics:  Daptomycin  700 mg IV daily    Duration: 6 weeks  End Date: 03/08/23 (6 weeks from 12/30 I&D)    [facility] Care Per Protocol:  Home health RN for IV administration and teaching; PICC line care and labs.    Labs weekly while on IV antibiotics: _x_ CBC with differential _x_ BMP  _x_ CRP _x_ ESR  _x_ CK  _x_ Please pull PIC at completion of IV antibiotics __ Please leave PIC in place until doctor has seen patient or been notified  Fax weekly labs to [phone]  Clinic Follow Up Appt:   James Dougherty has an appointment on  02/16/2023 at  1045 AM  [facility] for Infectious Disease, which  is located in the [facility] at  [address] in [place].  [address], which is located to the left of the elevators.  Phone: [phone]  Fax: [phone]  [email or web address]  The patient should arrive 30 minutes prior to their appoitment.  I will sign off for now  Please call with further questions.   Principal Problem:   Amputation stump infection (HCC) Active Problems:   Peripheral vascular disease,  unspecified (HCC)   Chronic kidney disease   Type II diabetes mellitus with complication (HCC)   Neuropathic pain   S/P AKA (above knee amputation) unilateral (HCC)   Scheduled Meds:  aspirin  EC  81 mg Oral Daily   atorvastatin   80 mg Oral QHS   clopidogrel   75 mg Oral Daily   enoxaparin  (LOVENOX ) injection  40 mg Subcutaneous Q24H   gabapentin   600 mg Oral TID   insulin  aspart  0-15 Units Subcutaneous TID WC   insulin  aspart  0-5 Units Subcutaneous QHS   insulin  glargine-yfgn  10 Units Subcutaneous QHS   metoprolol  succinate  50 mg Oral Daily   senna-docusate  1 tablet Oral Daily   Continuous Infusions:  vancomycin  HCl 1,500 mg (01/27/23 2152)   PRN Meds:.acetaminophen  **OR** acetaminophen , hydrALAZINE , HYDROcodone -acetaminophen , morphine  injection, ondansetron  (ZOFRAN ) IV, polyethylene glycol  HPI: James Dougherty is a 73 y.o. male DM2, HTN, HLD, prior CVA, HOH, PAD, right ICA stenosis, CKD, neuropathy, lymphedema comes into the hospital with infected left AKA stump wound. Recently hospitalized 11/15 - 11/22, for nonhealing left lower extremity wound, underwent left AKA and subsequent discharged to subacute rehab.   Continue to suffer some drainage the medial lateral aspects.  He was not however systemically ill on the 27th he was sent to skilled facility to the ER with drainage from the stump low-grade  temperature.  Taken the operating room and underwent I&D of abscess without clear-cut bone involvement.  MRSA is unfortunately resistant to tetracycline sensitive to Bactrim to which she is allergic.  VVS would like him to have 6 weeks of postoperative antibiotics --would presume due to concerns that bone could be involved.  Currently scheduled to go to a skilled nursing facility.  I think the safest option for him would be him to go the skilled facility and receive 6 weeks of postoperative daptomycin  there with follow-up in our clinic.  Also the patient about the risk of  daptomycin  induced myositis which she will monitor with CPK levels but also the rare entity of eosinophilic pneumonia and how to monitor for respiratory symptoms that would suggest this.    I have personally spent 80 minutes involved in face-to-face and non-face-to-face activities for this patient on the day of the visit. Professional time spent includes the following activities: Preparing to see the patient (review of tests), Obtaining and/or reviewing separately obtained history (admission/discharge record), Performing a medically appropriate examination and/or evaluation , Ordering medications/tests/procedures, referring and communicating with other health care professionals, Documenting clinical information in the EMR, Independently interpreting results (not separately reported), Communicating results to the patient/family/caregiver, Counseling and educating the patient/family/caregiver and Care coordination (not separately reported).    Review of Systems: Review of Systems  Constitutional:  Negative for chills, fever, malaise/fatigue and weight loss.  HENT:  Negative for congestion and sore throat.   Eyes:  Negative for blurred vision and photophobia.  Respiratory:  Negative for cough, shortness of breath and wheezing.   Cardiovascular:  Negative for chest pain, palpitations and leg swelling.  Gastrointestinal:  Negative for abdominal pain, blood in stool, constipation, diarrhea, heartburn, melena, nausea and vomiting.  Genitourinary:  Negative for dysuria, flank pain and hematuria.  Musculoskeletal:  Positive for falls. Negative for back pain, joint pain and myalgias.  Skin:  Negative for itching and rash.  Neurological:  Negative for dizziness, focal weakness, loss of consciousness, weakness and headaches.  Endo/Heme/Allergies:  Does not bruise/bleed easily.  Psychiatric/Behavioral:  Negative for depression and suicidal ideas. The patient does not have insomnia.     Past Medical  History:  Diagnosis Date   Arthritis    hands (12/27/2012)   Chronic kidney disease    stage 3, in [place]   Depression 10/26/2022   in [place]   High cholesterol    Hypertension    Neuromuscular disorder (HCC)    feet neuropathy   Neuropathic pain    PAD (peripheral artery disease) (HCC)    Psoriasis    Stroke (HCC) 1999   still have some speech problems at times; sometimes forget what I was going to say (12/27/2012)   Type II diabetes mellitus (HCC)    Ulcer    Left ankle/leg   Walker as ambulation aid 12/02/2021   or wheelchair    Social History   Tobacco Use   Smoking status: Former    Current packs/day: 0.00    Average packs/day: 3.0 packs/day for 45.0 years (135.0 ttl pk-yrs)    Types: Cigarettes    Start date: 01/06/1966    Quit date: 01/07/2011    Years since quitting: 12.0    Passive exposure: Never   Smokeless tobacco: Never  Vaping Use   Vaping status: Never Used  Substance Use Topics   Alcohol use: Not Currently    Comment: 12/27/2012 couple times/yr I might have a drink   Drug use: No  Family History  Problem Relation Age of Onset   Diabetes Sister        Bilateral amputation of lower legs   Diabetes Sister    Heart disease Sister 4       Heart disease before age 2   Hypertension Sister    Heart attack Sister    Hyperlipidemia Sister    Hypertension Father    Hyperlipidemia Father    Hyperlipidemia Mother    Hypertension Mother    Hypertension Daughter    Allergies  Allergen Reactions   Oxycodone  Nausea And Vomiting   Glipizide     Bad headache and blurred vision   Other     Soft silicone dressing caused wound to get worse   Bactrim [Sulfamethoxazole-Trimethoprim] Itching and Rash    OBJECTIVE: Blood pressure 108/62, pulse 72, temperature 97.8 F (36.6 C), temperature source Oral, resp. rate 18, height 5' 8 (1.727 m), weight 89.8 kg, SpO2 98%.  Physical Exam Constitutional:      Appearance: He is well-developed.  HENT:     Head:  Normocephalic and atraumatic.  Eyes:     Conjunctiva/sclera: Conjunctivae normal.  Cardiovascular:     Rate and Rhythm: Normal rate and regular rhythm.  Pulmonary:     Effort: Pulmonary effort is normal. No respiratory distress.     Breath sounds: No wheezing.  Abdominal:     General: There is no distension.     Palpations: Abdomen is soft.  Musculoskeletal:        General: No tenderness.     Cervical back: Normal range of motion and neck supple.  Skin:    General: Skin is warm and dry.     Coloration: Skin is not pale.     Findings: No erythema or rash.  Neurological:     General: No focal deficit present.     Mental Status: He is alert and oriented to person, place, and time.  Psychiatric:        Mood and Affect: Mood normal.        Behavior: Behavior normal.        Thought Content: Thought content normal.        Judgment: Judgment normal.    Surgical site dressed  Lab Results Lab Results  Component Value Date   WBC 7.8 01/26/2023   HGB 12.6 (L) 01/26/2023   HCT 37.3 (L) 01/26/2023   MCV 95.9 01/26/2023   PLT 329 01/26/2023    Lab Results  Component Value Date   CREATININE 1.01 01/26/2023   BUN 21 01/26/2023   NA 140 01/26/2023   K 4.3 01/26/2023   CL 111 01/26/2023   CO2 20 (L) 01/26/2023    Lab Results  Component Value Date   ALT 30 01/26/2023   AST 32 01/26/2023   ALKPHOS 68 01/26/2023   BILITOT 0.5 01/26/2023     Microbiology: Recent Results (from the past 240 hours)  Blood Cultures x 2 sites     Status: None   Collection Time: 01/22/23 12:40 PM   Specimen: BLOOD  Result Value Ref Range Status   Specimen Description BLOOD SITE NOT SPECIFIED  Final   Special Requests   Final    BOTTLES DRAWN AEROBIC AND ANAEROBIC Blood Culture results may not be optimal due to an inadequate volume of blood received in culture bottles   Culture   Final    NO GROWTH 5 DAYS Performed at [facility], [address], [place], [place] [address]  Report  Status 01/27/2023 FINAL  Final  Blood Cultures x 2 sites     Status: None   Collection Time: 01/22/23  1:00 PM   Specimen: BLOOD  Result Value Ref Range Status   Specimen Description BLOOD SITE NOT SPECIFIED  Final   Special Requests   Final    BOTTLES DRAWN AEROBIC AND ANAEROBIC Blood Culture results may not be optimal due to an inadequate volume of blood received in culture bottles   Culture   Final    NO GROWTH 5 DAYS Performed at [facility], [address] [place], [place] [address]    Report Status 01/27/2023 FINAL  Final  Surgical pcr screen     Status: Abnormal   Collection Time: 01/22/23  2:12 PM   Specimen: Nasal Mucosa; Nasal Swab  Result Value Ref Range Status   MRSA, PCR POSITIVE (A) NEGATIVE Final    Comment: RESULT CALLED TO, READ BACK BY AND VERIFIED WITH: RN L. STAMPS [id number] @[id number]  FH    Staphylococcus aureus POSITIVE (A) NEGATIVE Final    Comment: (NOTE) The Xpert SA Assay (FDA approved for NASAL specimens in patients 67 years of age and older), is one component of a comprehensive surveillance program. It is not intended to diagnose infection nor to guide or monitor treatment. Performed at [facility], [address] [place], [place] [address]   Aerobic/Anaerobic Culture w Gram Stain (surgical/deep wound)     Status: None   Collection Time: 01/22/23  3:51 PM   Specimen: Wound  Result Value Ref Range Status   Specimen Description WOUND  Final   Special Requests ABOVE LEFT KNEE AMPUTATION  Final   Gram Stain   Final    ABUNDANT WBC PRESENT, PREDOMINANTLY PMN RARE GRAM POSITIVE COCCI IN PAIRS    Culture   Final    ABUNDANT METHICILLIN RESISTANT STAPHYLOCOCCUS AUREUS NO ANAEROBES ISOLATED Performed at [facility], [address], [place], [place] [address]    Report Status 01/27/2023 FINAL  Final   Organism ID, Bacteria METHICILLIN RESISTANT STAPHYLOCOCCUS AUREUS  Final      Susceptibility   Methicillin resistant staphylococcus  aureus - MIC*    CIPROFLOXACIN >=8 RESISTANT Resistant     ERYTHROMYCIN >=8 RESISTANT Resistant     GENTAMICIN <=0.5 SENSITIVE Sensitive     OXACILLIN >=4 RESISTANT Resistant     TETRACYCLINE >=16 RESISTANT Resistant     VANCOMYCIN  <=0.5 SENSITIVE Sensitive     TRIMETH/SULFA <=10 SENSITIVE Sensitive     CLINDAMYCIN RESISTANT Resistant     RIFAMPIN <=0.5 SENSITIVE Sensitive     Inducible Clindamycin POSITIVE Resistant     LINEZOLID 4 SENSITIVE Sensitive     * ABUNDANT METHICILLIN RESISTANT STAPHYLOCOCCUS AUREUS    Jomarie Fleeta Rothman, MD [facility] for Infectious Disease [facility] [phone] pager  01/28/2023, 1:31 PM

## 2023-01-28 NOTE — Plan of Care (Signed)
  Problem: Education: Goal: Knowledge of General Education information will improve Description: Including pain rating scale, medication(s)/side effects and non-pharmacologic comfort measures Outcome: Progressing   Problem: Health Behavior/Discharge Planning: Goal: Ability to manage health-related needs will improve Outcome: Progressing   Problem: Clinical Measurements: Goal: Ability to maintain clinical measurements within normal limits will improve Outcome: Progressing Goal: Will remain free from infection Outcome: Progressing Goal: Diagnostic test results will improve Outcome: Progressing Goal: Respiratory complications will improve Outcome: Progressing Goal: Cardiovascular complication will be avoided Outcome: Progressing   Problem: Activity: Goal: Risk for activity intolerance will decrease Outcome: Progressing   Problem: Nutrition: Goal: Adequate nutrition will be maintained Outcome: Progressing   Problem: Coping: Goal: Level of anxiety will decrease Outcome: Progressing   Problem: Elimination: Goal: Will not experience complications related to bowel motility Outcome: Progressing Goal: Will not experience complications related to urinary retention Outcome: Progressing   Problem: Pain Management: Goal: General experience of comfort will improve Outcome: Progressing   Problem: Safety: Goal: Ability to remain free from injury will improve Outcome: Progressing   Problem: Skin Integrity: Goal: Risk for impaired skin integrity will decrease Outcome: Progressing   Problem: Fluid Volume: Goal: Hemodynamic stability will improve Outcome: Progressing   Problem: Clinical Measurements: Goal: Diagnostic test results will improve Outcome: Progressing Goal: Signs and symptoms of infection will decrease Outcome: Progressing   Problem: Respiratory: Goal: Ability to maintain adequate ventilation will improve Outcome: Progressing   Problem: Education: Goal:  Ability to describe self-care measures that may prevent or decrease complications (Diabetes Survival Skills Education) will improve Outcome: Progressing Goal: Individualized Educational Video(s) Outcome: Progressing   Problem: Coping: Goal: Ability to adjust to condition or change in health will improve Outcome: Progressing   Problem: Fluid Volume: Goal: Ability to maintain a balanced intake and output will improve Outcome: Progressing   Problem: Health Behavior/Discharge Planning: Goal: Ability to identify and utilize available resources and services will improve Outcome: Progressing Goal: Ability to manage health-related needs will improve Outcome: Progressing   Problem: Metabolic: Goal: Ability to maintain appropriate glucose levels will improve Outcome: Progressing   Problem: Nutritional: Goal: Maintenance of adequate nutrition will improve Outcome: Progressing Goal: Progress toward achieving an optimal weight will improve Outcome: Progressing   Problem: Skin Integrity: Goal: Risk for impaired skin integrity will decrease Outcome: Progressing   Problem: Tissue Perfusion: Goal: Adequacy of tissue perfusion will improve Outcome: Progressing

## 2023-01-28 NOTE — TOC Progression Note (Signed)
 Transition of Care [facility]) - Progression Note    Patient Details  Name: James Dougherty MRN: [id number] Date of Birth: 12-26-1950  Transition of Care [facility]) CM/SW Contact  Bridget Cordella Simmonds, LCSW Phone Number: 01/28/2023, 12:05 PM  Clinical Narrative:    CSW informed pt will need to DC on daptomycin .  CSW message Destiny/[facility], who initially asked for alternative due to cost but spoke with her admin and have agreed for pt to return on the dapto.  Team updated.   Expected Discharge Plan: Skilled Nursing Facility Barriers to Discharge: Continued Medical Work up  Expected Discharge Plan and Services In-house Referral: Clinical Social Work     Living arrangements for the past 2 months: Skilled Nursing Facility                                       Social Determinants of Health (SDOH) Interventions SDOH Screenings   Food Insecurity: No Food Insecurity (01/22/2023)  Housing: Unknown (01/22/2023)  Transportation Needs: No Transportation Needs (01/22/2023)  Utilities: Not At Risk (01/22/2023)  Depression (PHQ2-9): Low Risk  (12/03/2021)  Financial Resource Strain: Patient Declined (10/20/2022)   Received from [facility]  Physical Activity: Inactive (10/20/2022)   Received from [facility]  Social Connections: Unknown (01/26/2023)  Stress: No Stress Concern Present (10/20/2022)   Received from [facility]  Tobacco Use: Medium Risk (01/25/2023)  Health Literacy: Medium Risk (12/23/2022)   Received from [facility]    Readmission Risk Interventions     No data to display

## 2023-01-28 NOTE — Telephone Encounter (Signed)
 Triage: Called for prescription for prosthetic.  Sent to [facility]

## 2023-01-28 NOTE — Progress Notes (Signed)
 PROGRESS NOTE  James Dougherty FMW:[id number] DOB: [birth year] DOA: 01/22/2023 PCP: Atilano Deward ORN, MD   LOS: 6 days   Brief Narrative / Interim history: 73 year old male with history of DM2, HTN, HLD, prior CVA, HOH, PAD, right ICA stenosis, CKD, neuropathy, lymphedema comes into the hospital with infected left AKA stump wound. Recently hospitalized 11/15 - 11/22, for nonhealing left lower extremity wound, underwent left AKA and subsequent discharged to subacute rehab. 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  However he did not have any fever or chills and hence did not require hospitalization. 12/27, SNF sent patient to the ED with drainage from the stump, low-grade fever.  He was taken to the OR by vascular surgery on 12/27 s/p  I&D and wound VAC placement, and repeat surgery 12/30  Subjective / 24h Interval events: He is doing well this morning, no significant pain.  Assesement and Plan: Principal Problem:   Amputation stump infection (HCC) Active Problems:   Peripheral vascular disease, unspecified (HCC)   Chronic kidney disease   Type II diabetes mellitus with complication (HCC)   Neuropathic pain   S/P AKA (above knee amputation) unilateral (HCC)   Principal problem Severe sepsis due to left AKA stump wound infection -vascular surgery consulted and following, he was taken to the OR on 12/27 status post I&D, repeat surgery 12/30 showing healthy tissue.  Intraoperative culture 12/27 with MRSA -Vascular surgery will reevaluate the wound and change the wound VAC tomorrow.   -ID consulted, vascular does recommend 6 weeks of IV antibiotics  Active problems PAD - on aspirin , statin, Plavix .  He does have critical ischemia on the right lower extremity as well, but this will need to be addressed as an outpatient after the left AKA stump infection is better  treated.  Will be scheduled for right lower extremity angiography 1/15 as an outpatient  Chronic combined CHF-last echocardiogram in May 2022 he showed LVEF 30-40%, grade 3 diastolic dysfunction.  Appears euvolemic  Essential hypertension-continue metoprolol , blood pressure stable  DM2, poorly controlled, with hyperglycemia-A1c 8.4, currently on Levemir  and sliding scale.  CBG (last 3)  Recent Labs    01/27/23 1630 01/27/23 2134 01/28/23 0621  GLUCAP 152* 164* 96   Lab Results  Component Value Date   HGBA1C 8.4 (H) 12/11/2022   Diabetic neuropathy-continue medications as below  Prior CVA, right ICA stenosis-continue aspirin  and statin.  Resume Plavix  per vascular  Obesity, class I-mild, BMI 30.7  CKD 3B-baseline creatinine 1.3-1.5, currently better than baseline  Scheduled Meds:  aspirin  EC  81 mg Oral Daily   atorvastatin   80 mg Oral QHS   enoxaparin  (LOVENOX ) injection  40 mg Subcutaneous Q24H   gabapentin   600 mg Oral TID   Glycerin  (Adult)  1 suppository Rectal Once   insulin  aspart  0-15 Units Subcutaneous TID WC   insulin  aspart  0-5 Units Subcutaneous QHS   insulin  glargine-yfgn  10 Units Subcutaneous QHS   metoprolol  succinate  50 mg Oral Daily   senna-docusate  1 tablet Oral Daily   Continuous Infusions:  vancomycin  HCl 1,500 mg (01/27/23 2152)   PRN Meds:.acetaminophen  **OR** acetaminophen , hydrALAZINE , HYDROcodone -acetaminophen , morphine  injection, ondansetron  (ZOFRAN ) IV, polyethylene glycol  Current Outpatient Medications  Medication Instructions   aspirin  EC 81 mg, Daily   atorvastatin  (LIPITOR ) 80 mg, Oral, Daily at bedtime   Basaglar  KwikPen 21 Units, Subcutaneous, Daily  at bedtime   bisacodyl  (DULCOLAX) 5 mg, Daily PRN   clopidogrel  (PLAVIX ) 75 mg, Oral, Daily   empagliflozin  (JARDIANCE ) 10 mg, Oral, Daily   furosemide  (LASIX ) 20 mg, Oral, Daily   gabapentin  (NEURONTIN ) 600 mg, 3 times daily   insulin  aspart (NOVOLOG ) 0-15 Units, Subcutaneous, 3  times daily with meals   insulin  aspart (NOVOLOG ) 0-5 Units, Subcutaneous, Daily at bedtime   Melatonin 3 mg, At bedtime PRN   Menthol-Zinc Oxide (CALMOSEPTINE EX) 1 Application, 2 times daily   MENTHOL-ZINC OXIDE EX 1 Application, 4 times daily PRN   metoprolol  succinate (TOPROL -XL) 50 mg, Daily   polyethylene glycol (MIRALAX  / GLYCOLAX ) 17 g, Oral, Daily PRN   Protein POWD 6 g, 3 times daily   senna-docusate (SENOKOT-S) 8.6-50 MG tablet 1 tablet, Oral, Daily   traMADol  (ULTRAM ) 50 mg, Oral, Every 6 hours PRN   Trulicity  1.5 mg, Weekly    Diet Orders (From admission, onward)     Start     Ordered   01/25/23 1312  Diet heart healthy/carb modified Room service appropriate? Yes; Fluid consistency: Thin  Diet effective now       Question Answer Comment  Diet-HS Snack? Nothing   Room service appropriate? Yes   Fluid consistency: Thin      01/25/23 1311            DVT prophylaxis: enoxaparin  (LOVENOX ) injection 40 mg Start: 01/23/23 2200   Lab Results  Component Value Date   PLT 329 01/26/2023      Code Status: Full Code  Family Communication: no family at bedside   Status is: Inpatient Remains inpatient appropriate because: severity of illness  Level of care: Med-Surg  Consultants:  Vascular surgery  Objective: Vitals:   01/27/23 0939 01/27/23 1431 01/27/23 1959 01/28/23 0712  BP: 122/65 116/61 125/65 128/70  Pulse: 84 88 84 99  Resp: 16  18   Temp: 97.9 F (36.6 C) 97.6 F (36.4 C) 97.9 F (36.6 C) 97.8 F (36.6 C)  TempSrc: Axillary Axillary  Oral  SpO2: 95% 100% 98% 100%  Weight:      Height:        Intake/Output Summary (Last 24 hours) at 01/28/2023 1045 Last data filed at 01/28/2023 0830 Gross per 24 hour  Intake 540 ml  Output --  Net 540 ml   Wt Readings from Last 3 Encounters:  01/25/23 89.8 kg  01/18/23 92.5 kg  12/18/22 92.2 kg    Examination:  Constitutional: NAD Eyes: lids and conjunctivae normal, no scleral icterus ENMT:  mmm Neck: normal, supple Respiratory: clear to auscultation bilaterally, no wheezing, no crackles. Normal respiratory effort.  Cardiovascular: Regular rate and rhythm, no murmurs / rubs / gallops. No LE edema. Abdomen: soft, no distention, no tenderness. Bowel sounds positive.   Data Reviewed: I have independently reviewed following labs and imaging studies   CBC Recent Labs  Lab 01/22/23 1300 01/23/23 0534 01/24/23 0448 01/25/23 0528 01/26/23 0509  WBC 12.0* 12.3* 9.1 7.3 7.8  HGB 14.4 13.5 12.2* 12.5* 12.6*  HCT 44.3 42.1 37.2* 38.9* 37.3*  PLT 226 198 195 216 329  MCV 96.1 96.1 95.9 96.8 95.9  MCH 31.2 30.8 31.4 31.1 32.4  MCHC 32.5 32.1 32.8 32.1 33.8  RDW 14.5 14.3 14.5 14.7 15.0  LYMPHSABS 1.8  --  1.1 1.5  --   MONOABS 1.6*  --  0.9 0.9  --   EOSABS 0.2  --  0.8* 1.0*  --   BASOSABS 0.1  --  0.1 0.1  --     Recent Labs  Lab 01/22/23 1300 01/22/23 1324 01/23/23 0534 01/24/23 0448 01/25/23 0528 01/26/23 0509  NA 132*  --  137 137 138 140  K 4.4  --  4.4 3.9 3.9 4.3  CL 94*  --  102 105 107 111  CO2 23  --  22 23 22  20*  GLUCOSE 130*  --  177* 119* 98 139*  BUN 23  --  27* 33* 26* 21  CREATININE 1.58*  --  1.51* 1.31* 1.21 1.01  CALCIUM  8.5*  --  8.2* 7.9* 8.1* 8.1*  AST 39  --   --   --   --  32  ALT 24  --   --   --   --  30  ALKPHOS 86  --   --   --   --  68  BILITOT 1.5*  --   --   --   --  0.5  ALBUMIN  2.7*  --   --   --   --  2.2*  MG  --   --   --   --   --  2.3  LATICACIDVEN  --  2.7*  --  1.2  --   --     ------------------------------------------------------------------------------------------------------------------ No results for input(s): CHOL, HDL, LDLCALC, TRIG, CHOLHDL, LDLDIRECT in the last 72 hours.  Lab Results  Component Value Date   HGBA1C 8.4 (H) 12/11/2022   ------------------------------------------------------------------------------------------------------------------ No results for input(s): TSH, T4TOTAL,  T3FREE, THYROIDAB in the last 72 hours.  Invalid input(s): FREET3  Cardiac Enzymes No results for input(s): CKMB, TROPONINI, MYOGLOBIN in the last 168 hours.  Invalid input(s): CK ------------------------------------------------------------------------------------------------------------------ No results found for: BNP  CBG: Recent Labs  Lab 01/27/23 0601 01/27/23 1138 01/27/23 1630 01/27/23 2134 01/28/23 0621  GLUCAP 113* 142* 152* 164* 96    Recent Results (from the past 240 hours)  Blood Cultures x 2 sites     Status: None   Collection Time: 01/22/23 12:40 PM   Specimen: BLOOD  Result Value Ref Range Status   Specimen Description BLOOD SITE NOT SPECIFIED  Final   Special Requests   Final    BOTTLES DRAWN AEROBIC AND ANAEROBIC Blood Culture results may not be optimal due to an inadequate volume of blood received in culture bottles   Culture   Final    NO GROWTH 5 DAYS Performed at [facility], [address] [place], [place] [address]    Report Status 01/27/2023 FINAL  Final  Blood Cultures x 2 sites     Status: None   Collection Time: 01/22/23  1:00 PM   Specimen: BLOOD  Result Value Ref Range Status   Specimen Description BLOOD SITE NOT SPECIFIED  Final   Special Requests   Final    BOTTLES DRAWN AEROBIC AND ANAEROBIC Blood Culture results may not be optimal due to an inadequate volume of blood received in culture bottles   Culture   Final    NO GROWTH 5 DAYS Performed at [facility], [address], [place], [place] [address]    Report Status 01/27/2023 FINAL  Final  Surgical pcr screen     Status: Abnormal   Collection Time: 01/22/23  2:12 PM   Specimen: Nasal Mucosa; Nasal Swab  Result Value Ref Range Status   MRSA, PCR POSITIVE (A) NEGATIVE Final    Comment: RESULT CALLED TO, READ BACK BY AND VERIFIED WITH: RN FREDRIK SHEETS [phone] @  22 FH    Staphylococcus aureus POSITIVE (A) NEGATIVE Final    Comment: (NOTE) The Xpert SA  Assay (FDA approved for NASAL specimens in patients 11 years of age and older), is one component of a comprehensive surveillance program. It is not intended to diagnose infection nor to guide or monitor treatment. Performed at [facility], [address], [place], [place] [address]   Aerobic/Anaerobic Culture w Gram Stain (surgical/deep wound)     Status: None   Collection Time: 01/22/23  3:51 PM   Specimen: Wound  Result Value Ref Range Status   Specimen Description WOUND  Final   Special Requests ABOVE LEFT KNEE AMPUTATION  Final   Gram Stain   Final    ABUNDANT WBC PRESENT, PREDOMINANTLY PMN RARE GRAM POSITIVE COCCI IN PAIRS    Culture   Final    ABUNDANT METHICILLIN RESISTANT STAPHYLOCOCCUS AUREUS NO ANAEROBES ISOLATED Performed at [facility], [address], [place], [place] [address]    Report Status 01/27/2023 FINAL  Final   Organism ID, Bacteria METHICILLIN RESISTANT STAPHYLOCOCCUS AUREUS  Final      Susceptibility   Methicillin resistant staphylococcus aureus - MIC*    CIPROFLOXACIN >=8 RESISTANT Resistant     ERYTHROMYCIN >=8 RESISTANT Resistant     GENTAMICIN <=0.5 SENSITIVE Sensitive     OXACILLIN >=4 RESISTANT Resistant     TETRACYCLINE >=16 RESISTANT Resistant     VANCOMYCIN  <=0.5 SENSITIVE Sensitive     TRIMETH/SULFA <=10 SENSITIVE Sensitive     CLINDAMYCIN RESISTANT Resistant     RIFAMPIN <=0.5 SENSITIVE Sensitive     Inducible Clindamycin POSITIVE Resistant     LINEZOLID 4 SENSITIVE Sensitive     * ABUNDANT METHICILLIN RESISTANT STAPHYLOCOCCUS AUREUS     Radiology Studies: No results found.   Nilda Fendt, MD, PhD [facility]  Between 7 am - 7 pm I am available, please contact me via Amion (for emergencies) or Securechat (non urgent messages)  Between 7 pm - 7 am I am not available, please contact night coverage MD/APP via Amion

## 2023-01-28 NOTE — Plan of Care (Signed)
Care Plan Implemented

## 2023-01-29 DIAGNOSIS — T8740 Infection of amputation stump, unspecified extremity: Secondary | ICD-10-CM | POA: Diagnosis not present

## 2023-01-29 LAB — COMPREHENSIVE METABOLIC PANEL
ALT: 48 U/L — ABNORMAL HIGH (ref 0–44)
AST: 35 U/L (ref 15–41)
Albumin: 2.4 g/dL — ABNORMAL LOW (ref 3.5–5.0)
Alkaline Phosphatase: 78 U/L (ref 38–126)
Anion gap: 11 (ref 5–15)
BUN: 10 mg/dL (ref 8–23)
CO2: 21 mmol/L — ABNORMAL LOW (ref 22–32)
Calcium: 8.3 mg/dL — ABNORMAL LOW (ref 8.9–10.3)
Chloride: 107 mmol/L (ref 98–111)
Creatinine, Ser: 1.01 mg/dL (ref 0.61–1.24)
GFR, Estimated: >60 mL/min (ref >60)
Glucose, Bld: 118 mg/dL — ABNORMAL HIGH (ref 70–99)
Potassium: 5 mmol/L (ref 3.5–5.1)
Sodium: 139 mmol/L (ref 135–145)
Total Bilirubin: 0.9 mg/dL (ref 0.0–1.2)
Total Protein: 5.7 g/dL — ABNORMAL LOW (ref 6.5–8.1)

## 2023-01-29 LAB — GLUCOSE, CAPILLARY
Glucose-Capillary: 99 mg/dL (ref 70–99)
Glucose-Capillary: 192 mg/dL — ABNORMAL HIGH (ref 70–99)
Glucose-Capillary: 135 mg/dL — ABNORMAL HIGH (ref 70–99)
Glucose-Capillary: 111 mg/dL — ABNORMAL HIGH (ref 70–99)

## 2023-01-29 LAB — CBC
HCT: 44.1 % (ref 39.0–52.0)
Hemoglobin: 14.1 g/dL (ref 13.0–17.0)
MCH: 31.3 pg (ref 26.0–34.0)
MCHC: 32 g/dL (ref 30.0–36.0)
MCV: 97.8 fL (ref 80.0–100.0)
Platelets: 334 10*3/uL (ref 150–400)
RBC: 4.51 MIL/uL (ref 4.22–5.81)
RDW: 15.5 % (ref 11.5–15.5)
WBC: 10.2 10*3/uL (ref 4.0–10.5)
nRBC: 0 % (ref 0.0–0.2)

## 2023-01-29 LAB — MAGNESIUM: Magnesium: 2.2 mg/dL (ref 1.7–2.4)

## 2023-01-29 MED ORDER — FLEET ENEMA RE ENEM
1.0000 | ENEMA | Freq: Once | RECTAL | Status: AC
  Administered 2023-01-29: 1 via RECTAL
  Filled 2023-01-29: qty 1

## 2023-01-29 MED ORDER — POLYETHYLENE GLYCOL 3350 17 G PO PACK
17.0000 g | PACK | Freq: Two times a day (BID) | ORAL | Status: DC
  Administered 2023-01-29 – 2023-01-30 (×3): 17 g via ORAL
  Filled 2023-01-29 (×3): qty 1

## 2023-01-29 MED ORDER — DAPTOMYCIN-SODIUM CHLORIDE 700-0.9 MG/100ML-% IV SOLN
8.0000 mg/kg | Freq: Every day | INTRAVENOUS | Status: DC
  Administered 2023-01-29 – 2023-02-12 (×15): 700 mg via INTRAVENOUS
  Filled 2023-01-29 (×16): qty 100

## 2023-01-29 MED ORDER — PROPOFOL 1000 MG/100ML IV EMUL
INTRAVENOUS | Status: AC
Start: 2023-01-29 — End: ?
  Filled 2023-01-29: qty 100

## 2023-01-29 NOTE — Progress Notes (Signed)
 Wound vac seems to trouble, blockage alert was alarming. This RN changed the canister and removed a clot of blood from the connection and reenforced the wound site with transparent tape. Ordered a wound consult. PA of vascular team was informed. Wound vac was changed by Wound RN.

## 2023-01-29 NOTE — Progress Notes (Signed)
 James Dougherty PROGRESS NOTE  James Dougherty  DOB: Jul 19, 1950  PCP: Atilano Deward ORN, MD FMW:[id number]  DOA: 01/22/2023  LOS: 7 days  Hospital Day: 8  Brief narrative: James Dougherty is a 73 y.o. male with PMH significant for obesity, DM2, HTN, HLD, stroke, PAD, critical right ICA stenosis, CKD, neuropathy, lymphedema.   Recently hospitalized 11/15 - 11/22, for nonhealing left lower extremity wound, underwent left AKA and subsequent discharged to subacute rehab. 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  However he did not have any fever or chills and hence did not require hospitalization. 12/27, SNF sent patient to the ED with drainage from the stump, low-grade fever  In the ED, patient was afebrile, hemodynamically stable Initial labs with WBC count 12,000, lactic acid 2.7 Admitted to [facility] Vascular surgery consulted 12/27 patient was taken to the OR by vascular surgery underwent I&D of the stump and application of wound. 12/23, repeat I&D and wound VAC placement done   Subjective: Patient was seen and examined this morning.   Complaining of abdominal pain.  He has not had a bowel movement in several days Vascular surgery note from this morning reviewed.  Noted plan to take him to the OR again on Monday  Assessment and plan: Severe sepsis POA Infected left AKA stump wound  S/p I&D of the stump and application of wound VAC - 12/27 Dr. Magda IntraOp culture from 12/27 showed MRSA. ID consulted.  Currently on oxygen 6 course of IV daptomycin . Per vascular surgery, patient would likely need to go back to the OR on Monday 1/for further I&D and wound VAC change. Continue with as needed pain med. Recent Labs  Lab 01/23/23 0534 01/24/23 0448 01/25/23 0528 01/26/23 0509 01/29/23 0710  WBC 12.3* 9.1 7.3 7.8 10.2  LATICACIDVEN  --  1.2  --   --   --    PAD S/p Left  AKA 11/15 Patient on aspirin , Plavix  and statin,  He does have critical ischemia on the right lower extremity as well, but this will need to be addressed as an outpatient after the left AKA stump infection is better treated.  Will be scheduled for right lower extremity angiography 1/15 as an outpatient    Chronic combined systolic and diastolic congestive heart failure Hypertension Last TTE May/2023-LVEF 30-40% , g3dd PTA meds- Toprol  50 mg daily, Lasix  20 mg twice daily, Jardiance  10 mg daily Currently continued with Toprol  50 mg oral daily Continue to hold Lasix  and Jardiance  for now  Type 2 diabetes mellitus Diabetic neuropathy A1c 8.4 on November 2020 PTA meds-Levemir , Jardiance  and Trulicity  Currently on Levemir  10 units along with SSI/Accu-Cheks For neuropathy, continue Cymbalta  and gabapentin  Recent Labs  Lab 01/28/23 1120 01/28/23 1625 01/28/23 2050 01/29/23 0619 01/29/23 1115  GLUCAP 150* 170* 185* 99 192*   H/o stroke  right ICA stenosis HLD Continue with aspirin  Plavix  and statin.    CKD 3B Renal function at baseline, continue to monitor closely Recent Labs    12/15/22 0535 12/16/22 0749 12/17/22 0756 12/18/22 0656 01/22/23 1300 01/23/23 0534 01/24/23 0448 01/25/23 0528 01/26/23 0509 01/29/23 0710  BUN 19 20 18 16 23  27* 33* 26* 21 10  CREATININE 1.09 1.17 1.38* 1.17 1.58* 1.51* 1.31* 1.21 1.01 1.01    Obesity  Body mass index is 30.03 kg/m. Patient has been advised to make an attempt to improve  diet and exercise patterns to aid in weight loss.  Constipation Reports abdominal pain and no BM in last several days Give 1 dose of Fleet enema today.  Started scheduled Senokot and MiraLAX  twice daily.  Mobility: PT eval  Goals of care   Code Status: Full Code     DVT prophylaxis:  enoxaparin  (LOVENOX ) injection 40 mg Start: 01/23/23 2200   Antimicrobials: Daptomycin  IV Fluid: None Consultants: Orthopedics, ID Family Communication: None at  bedside  Status: Inpatient Level of care:  Med-Surg   Patient is from: SNF Needs to continue in-hospital care: Pending repeat debridement in OR on Monday 1/6. Anticipated d/c to: May need reauthorization to go back to SNF.  Pending PT eval    Diet:  Diet Order             Diet heart healthy/carb modified Room service appropriate? Yes; Fluid consistency: Thin  Diet effective now                   Scheduled Meds:  aspirin  EC  81 mg Oral Daily   atorvastatin   80 mg Oral QHS   clopidogrel   75 mg Oral Daily   enoxaparin  (LOVENOX ) injection  40 mg Subcutaneous Q24H   gabapentin   600 mg Oral TID   insulin  aspart  0-15 Units Subcutaneous TID WC   insulin  aspart  0-5 Units Subcutaneous QHS   insulin  glargine-yfgn  10 Units Subcutaneous QHS   metoprolol  succinate  50 mg Oral Daily   polyethylene glycol  17 g Oral BID   senna-docusate  1 tablet Oral Daily    PRN meds: acetaminophen  **OR** acetaminophen , hydrALAZINE , HYDROcodone -acetaminophen , morphine  injection, ondansetron  (ZOFRAN ) IV   Infusions:   DAPTOmycin  700 mg (01/29/23 1136)    Antimicrobials: Anti-infectives (From admission, onward)    Start     Dose/Rate Route Frequency Ordered Stop   01/29/23 1030  DAPTOmycin  (CUBICIN ) IVPB 700 mg/112mL premix        8 mg/kg  89.6 kg 200 mL/hr over 30 Minutes Intravenous Daily 01/29/23 0933     01/26/23 2100  vancomycin  (VANCOCIN ) 1,500 mg in sodium chloride  0.9 % 500 mL IVPB  Status:  Discontinued        1,500 mg 257.5 mL/hr over 120 Minutes Intravenous  Once 01/26/23 1328 01/26/23 1330   01/26/23 2100  vancomycin  (VANCOREADY) IVPB 1500 mg/300 mL        1,500 mg 150 mL/hr over 120 Minutes Intravenous Every 24 hours 01/26/23 1331 01/28/23 2257   01/25/23 2100  Vancomycin  (VANCOCIN ) 1,500 mg in sodium chloride  0.9 % 500 mL IVPB  Status:  Discontinued        1,500 mg 250 mL/hr over 120 Minutes Intravenous Every 24 hours 01/25/23 1354 01/26/23 1328   01/23/23 2100   vancomycin  (VANCOREADY) IVPB 1250 mg/250 mL  Status:  Discontinued        1,250 mg 166.7 mL/hr over 90 Minutes Intravenous Every 24 hours 01/22/23 1722 01/25/23 1354   01/23/23 1800  vancomycin  (VANCOREADY) IVPB 1250 mg/250 mL  Status:  Discontinued        1,250 mg 166.7 mL/hr over 90 Minutes Intravenous Every 24 hours 01/22/23 1617 01/22/23 1722   01/22/23 2055  ceFEPIme  (MAXIPIME ) 2 g in sodium chloride  0.9 % 100 mL IVPB  Status:  Discontinued        2 g 200 mL/hr over 30 Minutes Intravenous Every 12 hours 01/22/23 2055 01/26/23 1500   01/22/23 1745  Vancomycin  (VANCOCIN ) 1,500 mg in sodium  chloride 0.9 % 500 mL IVPB        1,500 mg 250 mL/hr over 120 Minutes Intravenous  Once 01/22/23 1617 01/23/23 0800   01/22/23 1730  ceFEPIme  (MAXIPIME ) 2 g in sodium chloride  0.9 % 100 mL IVPB  Status:  Discontinued        2 g 200 mL/hr over 30 Minutes Intravenous Every 8 hours 01/22/23 1617 01/22/23 2055   01/22/23 1519  ceFAZolin  (ANCEF ) 2-4 GM/100ML-% IVPB       Note to Pharmacy: Effie Rily O: cabinet override      01/22/23 1519 01/23/23 0329       Objective: Vitals:   01/29/23 0833 01/29/23 1340  BP: 130/70 (!) 125/51  Pulse: 95 80  Resp: 18 16  Temp: 97.7 F (36.5 C) 98 F (36.7 C)  SpO2: 96% 100%    Intake/Output Summary (Last 24 hours) at 01/29/2023 1439 Last data filed at 01/29/2023 0500 Gross per 24 hour  Intake --  Output 850 ml  Net -850 ml   Filed Weights   01/22/23 1410 01/25/23 1003 01/29/23 0507  Weight: 91.6 kg 89.8 kg 89.6 kg   Weight change:  Body mass index is 30.03 kg/m.   Physical Exam: General exam: Pleasant, not in pain or distress. Skin: No rashes, lesions or ulcers. HEENT: Atraumatic, normocephalic, no obvious bleeding Lungs: Clear to auscultation bilaterally,  CVS: S1, S2, no murmur,   GI/Abd: Soft, mildly distended abdomen with mild tenderness in lower abdomen, bowel sound present,   CNS: Alert, awake, oriented x 3 Psychiatry: Mood appropriate,   Extremities: No pedal edema, no calf tenderness, left AKA stump postsurgical status.  Wound VAC with serosanguineous drainage  Data Review: I have personally reviewed the laboratory data and studies available.  F/u labs  Unresulted Labs (From admission, onward)     Start     Ordered   01/28/23 0906  MIC (1 Drug)-Wound culture; 01/22/2023; Wound; MRSA / Staph aureus; Daptomycin ; Patient immune status: Normal  Once,   R       Question Answer Comment  Original Culture Order (Name): Wound culture   Collection Date 01/22/2023   Collection Time 3:51 PM   Culture Source Wound   Organism MRSA / Staph aureus   Antibiotic 1: Daptomycin    Patient immune status Normal      01/28/23 0910           Total time spent in review of labs and imaging, patient evaluation, formulation of plan, documentation and communication with family: 45 minutes  Signed, Chapman Rota, MD [facility] 01/29/2023

## 2023-01-29 NOTE — Consult Note (Addendum)
 WOC team was consulted because the [facility] machine was alarming, and the Vascular team change this morning.  Removed old NPWT dressing Cleansed wound with normal saline Periwound skin protected with skin barrier wipe or window framed with drape Skin protected to the hip with VAC drape for foam bridge  Filled wound with 1 piece of black foam  Sealed NPWT dressing at HG/137mmHG  Patient received IV/PO pain medication per bedside nurse prior to dressing change Patient tolerated procedure well  Last dressing was covering by clogs and leaking blood close to the canister plug. Notice a continuous bleeding when took the dressing off. The bedside nurse told that he removed a big clog on the tube. He change the Canister.   WOC nurse will continue to provide NPWT dressing changed due to the complexity of the dressing change.  Next Change on Monday.  The Vascular need to be call if is a suction problem with the machine. The WOC team does not work on weekends.   Thank-you,  Lela Holm BSN, RN, [employer], WOC  (Pager: [phone])

## 2023-01-29 NOTE — Progress Notes (Addendum)
  Progress Note    01/29/2023 7:13 AM 4 Days Post-Op  Subjective:  says his leg feels better unless someone starts messing with it.  Afebrile  Vitals:   01/28/23 2038 01/29/23 0507  BP: (!) 136/97 106/60  Pulse: 92 62  Resp: 17 16  Temp: 97.6 F (36.4 C) (!) 97.4 F (36.3 C)  SpO2: 100% 97%    Physical Exam: Incisions:  wound vac removed and as pictured:       CBC    Component Value Date/Time   WBC 7.8 01/26/2023 0509   RBC 3.89 (L) 01/26/2023 0509   HGB 12.6 (L) 01/26/2023 0509   HCT 37.3 (L) 01/26/2023 0509   PLT 329 01/26/2023 0509   MCV 95.9 01/26/2023 0509   MCH 32.4 01/26/2023 0509   MCHC 33.8 01/26/2023 0509   RDW 15.0 01/26/2023 0509   LYMPHSABS 1.5 01/25/2023 0528   MONOABS 0.9 01/25/2023 0528   EOSABS 1.0 (H) 01/25/2023 0528   BASOSABS 0.1 01/25/2023 0528    BMET    Component Value Date/Time   NA 140 01/26/2023 0509   K 4.3 01/26/2023 0509   CL 111 01/26/2023 0509   CO2 20 (L) 01/26/2023 0509   GLUCOSE 139 (H) 01/26/2023 0509   BUN 21 01/26/2023 0509   CREATININE 1.01 01/26/2023 0509   CALCIUM  8.1 (L) 01/26/2023 0509   GFRNONAA >60 01/26/2023 0509   GFRAA 47 (L) 04/28/2017 0238    INR    Component Value Date/Time   INR 1.2 12/11/2022 1105     Intake/Output Summary (Last 24 hours) at 01/29/2023 0713 Last data filed at 01/29/2023 0500 Gross per 24 hour  Intake 480 ml  Output 1350 ml  Net -870 ml     Assessment/Plan:  73 y.o. male is s/p I&D left AKA and wound vac 01/22/2023 and simple partial closure of left AKA and wound vac placement 01/25/2023 both by Dr. Magda.  4 Days Post-Op   -vac removed today and as pictured above.  Some granulation tissue.  No frank pus when vac removed.  Most likely will have WOC place wound vac back but for now, there is wet to dry dressing in place.  -plan for twice weekly vac changes. -appreciate ID consult.  Plan for 6 weeks of IV daptomycin . -Dr. Lanis to provide further recommendations after  he sees pt this am -he is planning for agm on 02/09/2022 as outpatient to evaluate right leg   Lucie Apt, PA-C [facility] [phone] 01/29/2023 7:13 AM   VASCULAR STAFF ADDENDUM: I have independently interviewed and examined the patient. I agree with the above.  Main complaint constipation. VAC changed today. Retention sutures removed. Some nonviable tissue appreciated. Bedside debridement.  Vac replaced.  Will change Monday in the OR. Will attempt partial closure.  Please make NPO Sunday midnight.    Fonda FORBES Lanis MD [facility] Phone Number: [phone] 01/29/2023 8:28 AM

## 2023-01-29 NOTE — Progress Notes (Signed)
 Physical Therapy Treatment Patient Details Name: James Dougherty MRN: [id number] DOB: 04/16/1950 Today's Date: 01/29/2023   History of Present Illness 73 y.o. male presents to [facility] 01/22/23 from 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 S/p I&D of L AKA and application of wound vac 12/27. s/pr I&D and wound vac change 12/30. Past medical history of arthritis, hyperlipidemia, hypertension, neuropathic pain, PAD, psoriasis, stroke, diabetes, CKD 3B, amputation R great toe 2019, R ICA stenosis    PT Comments  Pt progressing slowly toward goals, limited by fear and anxiety.  Emphasis on exercise, transition to EOB, sitting balance with guard and UE to limited UE assist, sit to stand into STEDY x2 before transition back to supine.  Ended with pt in bed's sitting position.     If plan is discharge home, recommend the following: A lot of help with walking and/or transfers;A lot of help with bathing/dressing/bathroom;Direct supervision/assist for medications management;Direct supervision/assist for financial management;Assist for transportation;Help with stairs or ramp for entrance   Can travel by private vehicle     No  Equipment Recommendations  Other (comment) (TBD at next venue)    Recommendations for Other Services       Precautions / Restrictions Precautions Precautions: Fall Precaution Comments: L AKA, wound vac     Mobility  Bed Mobility Overal bed mobility: Needs Assistance Bed Mobility: Sidelying to Sit, Sit to Supine   Sidelying to sit: Max assist, +2 for physical assistance   Sit to supine: Max assist, +2 for physical assistance   General bed mobility comments: Worked on transition to EOB and desensitization of balance at EOB    Transfers Overall transfer level: Needs assistance Equipment used: Ambulation equipment used (STEDY) Transfers: Sit to/from Stand Sit to Stand: Max assist, Total assist, +2 physical  assistance           General transfer comment: use of stedy to achieve a total assist of 2 persons stand into the STEDY x2.  Pt was anxiety ridden and was yelling out in fear instead of pain. Transfer via Lift Equipment: Stedy  Ambulation/Gait               General Gait Details: not able   Stairs             Wheelchair Mobility     Tilt Bed    Modified Rankin (Stroke Patients Only)       Balance Overall balance assessment: Needs assistance   Sitting balance-Leahy Scale: Poor (to fair) Sitting balance - Comments: can sit for short period at EOB with no UE's, but anxiety and fear generally keeps pt for trying to balance without UE's   Standing balance support: Bilateral upper extremity supported, During functional activity Standing balance-Leahy Scale: Zero                              Cognition Arousal: Alert Behavior During Therapy: WFL for tasks assessed/performed, Anxious Overall Cognitive Status:  (Likely at baseline [facility])                                 General Comments: HOH        Exercises Other Exercises Other Exercises: bil bicep/tricep presses with graded resistance x10 reps Other Exercises: R hip/knee flex/ext x10 reps with graded resistance, L A/AAROM    General  Comments General comments (skin integrity, edema, etc.): pt mentioned vertigo, but stated he has had dizziness, spinning for a long time.  Kept movements/transition slower.      Pertinent Vitals/Pain Pain Assessment Pain Assessment: Faces Faces Pain Scale: Hurts little more Pain Location: L AKA stump Pain Descriptors / Indicators: Aching, Discomfort Pain Intervention(s): Monitored during session    Home Living                          Prior Function            PT Goals (current goals can now be found in the care plan section) Acute Rehab PT Goals Patient Stated Goal: to get stronger PT Goal Formulation: With patient Time For  Goal Achievement: 02/06/23 Potential to Achieve Goals: Good Progress towards PT goals: Progressing toward goals    Frequency    Min 1X/week      PT Plan      Co-evaluation              AM-PAC PT 6 Clicks Mobility   Outcome Measure  Help needed turning from your back to your side while in a flat bed without using bedrails?: A Lot Help needed moving from lying on your back to sitting on the side of a flat bed without using bedrails?: Total Help needed moving to and from a bed to a chair (including a wheelchair)?: Total Help needed standing up from a chair using your arms (e.g., wheelchair or bedside chair)?: A Lot Help needed to walk in hospital room?: A Little   6 Click Score: 9    End of Session   Activity Tolerance: Patient tolerated treatment well Patient left: in bed;with call bell/phone within reach;with bed alarm set Nurse Communication: Mobility status PT Visit Diagnosis: Muscle weakness (generalized) (M62.81);Other abnormalities of gait and mobility (R26.89)     Time: 1520-1601 PT Time Calculation (min) (ACUTE ONLY): 41 min  Charges:    $Therapeutic Exercise: 8-22 mins $Therapeutic Activity: 23-37 mins PT General Charges $$ ACUTE PT VISIT: 1 Visit                     01/29/2023  India HERO., PT Acute Rehabilitation Services [phone]  (office)   Vinie GAILS Aneta Hendershott 01/29/2023, 7:38 PM

## 2023-01-30 DIAGNOSIS — T8740 Infection of amputation stump, unspecified extremity: Secondary | ICD-10-CM | POA: Diagnosis not present

## 2023-01-30 LAB — GLUCOSE, CAPILLARY
Glucose-Capillary: 117 mg/dL — ABNORMAL HIGH (ref 70–99)
Glucose-Capillary: 113 mg/dL — ABNORMAL HIGH (ref 70–99)
Glucose-Capillary: 128 mg/dL — ABNORMAL HIGH (ref 70–99)
Glucose-Capillary: 119 mg/dL — ABNORMAL HIGH (ref 70–99)

## 2023-01-30 MED ORDER — POLYETHYLENE GLYCOL 3350 17 G PO PACK: 17.0000 g | PACK | Freq: Every day | ORAL | Status: DC | PRN

## 2023-01-30 NOTE — Progress Notes (Signed)
  Progress Note    01/30/2023 7:30 AM 5 Days Post-Op  Subjective:  no complaints  Vitals:   01/29/23 2008 01/30/23 0537  BP: (!) 139/56 (!) 140/50  Pulse: 71 65  Resp: 18 18  Temp: 98.2 F (36.8 C) 97.9 F (36.6 C)  SpO2: 99% 97%    Physical Exam: Incisions:  vac in place on suction     CBC    Component Value Date/Time   WBC 10.2 01/29/2023 0710   RBC 4.51 01/29/2023 0710   HGB 14.1 01/29/2023 0710   HCT 44.1 01/29/2023 0710   PLT 334 01/29/2023 0710   MCV 97.8 01/29/2023 0710   MCH 31.3 01/29/2023 0710   MCHC 32.0 01/29/2023 0710   RDW 15.5 01/29/2023 0710   LYMPHSABS 1.5 01/25/2023 0528   MONOABS 0.9 01/25/2023 0528   EOSABS 1.0 (H) 01/25/2023 0528   BASOSABS 0.1 01/25/2023 0528    BMET    Component Value Date/Time   NA 139 01/29/2023 0710   K 5.0 01/29/2023 0710   CL 107 01/29/2023 0710   CO2 21 (L) 01/29/2023 0710   GLUCOSE 118 (H) 01/29/2023 0710   BUN 10 01/29/2023 0710   CREATININE 1.01 01/29/2023 0710   CALCIUM  8.3 (L) 01/29/2023 0710   GFRNONAA >60 01/29/2023 0710   GFRAA 47 (L) 04/28/2017 0238    INR    Component Value Date/Time   INR 1.2 12/11/2022 1105     Intake/Output Summary (Last 24 hours) at 01/30/2023 0730 Last data filed at 01/30/2023 0100 Gross per 24 hour  Intake 550 ml  Output 900 ml  Net -350 ml     Assessment/Plan:  73 y.o. male is s/p I&D left AKA and wound vac 01/22/2023 and simple partial closure of left AKA and wound vac placement 01/25/2023 both by Dr. Magda.  5 Days Post-Op   -plan for vac change with Dr. Lanis Monday in the OR and possible attempt partial closure.  -Please make NPO Sunday midnight.  -appreciate ID consult.  Plan for 6 weeks of IV daptomycin .  Norman GORMAN Serve MD [facility] Phone Number: [phone] 01/30/2023 7:31 AM

## 2023-01-30 NOTE — Progress Notes (Signed)
 [facility] PROGRESS NOTE  James Dougherty  DOB: Jul 08, 1950  PCP: Atilano Deward ORN, MD FMW:[id number]  DOA: 01/22/2023  LOS: 8 days  Hospital Day: 9  Brief narrative: James Dougherty is a 73 y.o. male with PMH significant for obesity, DM2, HTN, HLD, stroke, PAD, critical right ICA stenosis, CKD, neuropathy, lymphedema.   Recently hospitalized 11/15 - 11/22, for nonhealing left lower extremity wound, underwent left AKA and subsequent discharged to subacute rehab. 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  However he did not have any fever or chills and hence did not require hospitalization. 12/27, SNF sent patient to the ED with drainage from the stump, low-grade fever  In the ED, patient was afebrile, hemodynamically stable Initial labs with WBC count 12,000, lactic acid 2.7 Admitted to [facility] Vascular surgery consulted 12/27 patient was taken to the OR by vascular surgery underwent I&D of the stump and application of wound. 12/23, repeat I&D and wound VAC placement done.  Subjective: Patient was seen and examined this morning.   Feels better after constipation resolved with enema.  Assessment and plan: Severe sepsis POA Infected left AKA stump wound  S/p I&D of the stump and application of wound VAC - 12/27 Dr. Magda IntraOp culture from 12/27 showed MRSA. ID consulted.  Currently on oxygen 6 course of IV daptomycin . Per vascular surgery, patient would likely need to go back to the OR on Monday 1/for further I&D and wound VAC change. Continue with as needed pain med. Recent Labs  Lab 01/24/23 0448 01/25/23 0528 01/26/23 0509 01/29/23 0710  WBC 9.1 7.3 7.8 10.2  LATICACIDVEN 1.2  --   --   --    PAD S/p Left AKA 11/15 Patient on aspirin , Plavix  and statin,  He does have critical ischemia on the right lower extremity as well, but this will need to be  addressed as an outpatient after the left AKA stump infection is better treated.  Will be scheduled for right lower extremity angiography 1/15 as an outpatient    Chronic combined systolic and diastolic congestive heart failure Hypertension Last TTE May/2023-LVEF 30-40% , g3dd PTA meds- Toprol  50 mg daily, Lasix  20 mg twice daily, Jardiance  10 mg daily Currently continued with Toprol  50 mg oral daily Continue to hold Lasix  and Jardiance  for now  Type 2 diabetes mellitus Diabetic neuropathy A1c 8.4 on November 2020 PTA meds-Levemir , Jardiance  and Trulicity  Currently on Levemir  10 units along with SSI/Accu-Cheks For neuropathy, continue Cymbalta  and gabapentin  Recent Labs  Lab 01/29/23 0619 01/29/23 1115 01/29/23 1605 01/29/23 2116 01/30/23 0701  GLUCAP 99 192* 135* 111* 117*   H/o stroke  right ICA stenosis HLD Continue with aspirin  Plavix  and statin.    CKD 3B Renal function at baseline, continue to monitor closely Recent Labs    12/15/22 0535 12/16/22 0749 12/17/22 0756 12/18/22 0656 01/22/23 1300 01/23/23 0534 01/24/23 0448 01/25/23 0528 01/26/23 0509 01/29/23 0710  BUN 19 20 18 16 23  27* 33* 26* 21 10  CREATININE 1.09 1.17 1.38* 1.17 1.58* 1.51* 1.31* 1.21 1.01 1.01    Obesity  Body mass index is 30.03 kg/m. Patient has been advised to make an attempt to improve diet and exercise patterns to aid in weight loss.  Constipation Yesterday, reported abdominal pain and no BM in last several days.  Had successful bowel movement with 1 dose of enema.  Continue scheduled Senokot and MiraLAX  PRN  Mobility: PT eval was obtained.  Recommend SNF  Goals of care   Code Status: Full Code     DVT prophylaxis:  enoxaparin  (LOVENOX ) injection 40 mg Start: 01/23/23 2200   Antimicrobials: Daptomycin  IV Fluid: None Consultants: Orthopedics, ID Family Communication: None at bedside  Status: Inpatient Level of care:  Med-Surg   Patient is from: SNF Needs to  continue in-hospital care: Pending repeat debridement in OR on Monday 1/6. Anticipated d/c to: May need reauthorization to go back to SNF.  Pending PT eval    Diet:  Diet Order             Diet heart healthy/carb modified Room service appropriate? Yes; Fluid consistency: Thin  Diet effective now                   Scheduled Meds:  aspirin  EC  81 mg Oral Daily   atorvastatin   80 mg Oral QHS   clopidogrel   75 mg Oral Daily   enoxaparin  (LOVENOX ) injection  40 mg Subcutaneous Q24H   gabapentin   600 mg Oral TID   insulin  aspart  0-15 Units Subcutaneous TID WC   insulin  aspart  0-5 Units Subcutaneous QHS   insulin  glargine-yfgn  10 Units Subcutaneous QHS   metoprolol  succinate  50 mg Oral Daily   senna-docusate  1 tablet Oral Daily    PRN meds: acetaminophen  **OR** acetaminophen , hydrALAZINE , HYDROcodone -acetaminophen , morphine  injection, ondansetron  (ZOFRAN ) IV, polyethylene glycol   Infusions:   DAPTOmycin  700 mg (01/29/23 1136)    Antimicrobials: Anti-infectives (From admission, onward)    Start     Dose/Rate Route Frequency Ordered Stop   01/29/23 1030  DAPTOmycin  (CUBICIN ) IVPB 700 mg/122mL premix        8 mg/kg  89.6 kg 200 mL/hr over 30 Minutes Intravenous Daily 01/29/23 0933     01/26/23 2100  vancomycin  (VANCOCIN ) 1,500 mg in sodium chloride  0.9 % 500 mL IVPB  Status:  Discontinued        1,500 mg 257.5 mL/hr over 120 Minutes Intravenous  Once 01/26/23 1328 01/26/23 1330   01/26/23 2100  vancomycin  (VANCOREADY) IVPB 1500 mg/300 mL        1,500 mg 150 mL/hr over 120 Minutes Intravenous Every 24 hours 01/26/23 1331 01/28/23 2257   01/25/23 2100  Vancomycin  (VANCOCIN ) 1,500 mg in sodium chloride  0.9 % 500 mL IVPB  Status:  Discontinued        1,500 mg 250 mL/hr over 120 Minutes Intravenous Every 24 hours 01/25/23 1354 01/26/23 1328   01/23/23 2100  vancomycin  (VANCOREADY) IVPB 1250 mg/250 mL  Status:  Discontinued        1,250 mg 166.7 mL/hr over 90 Minutes  Intravenous Every 24 hours 01/22/23 1722 01/25/23 1354   01/23/23 1800  vancomycin  (VANCOREADY) IVPB 1250 mg/250 mL  Status:  Discontinued        1,250 mg 166.7 mL/hr over 90 Minutes Intravenous Every 24 hours 01/22/23 1617 01/22/23 1722   01/22/23 2055  ceFEPIme  (MAXIPIME ) 2 g in sodium chloride  0.9 % 100 mL IVPB  Status:  Discontinued        2 g 200 mL/hr over 30 Minutes Intravenous Every 12 hours 01/22/23 2055 01/26/23 1500   01/22/23 1745  Vancomycin  (VANCOCIN ) 1,500 mg in sodium chloride  0.9 % 500 mL IVPB        1,500 mg 250 mL/hr over 120 Minutes Intravenous  Once 01/22/23 1617 01/23/23 0800   01/22/23 1730  ceFEPIme  (MAXIPIME ) 2 g in sodium chloride  0.9 % 100 mL IVPB  Status:  Discontinued        2 g 200 mL/hr over 30 Minutes Intravenous Every 8 hours 01/22/23 1617 01/22/23 2055   01/22/23 1519  ceFAZolin  (ANCEF ) 2-4 GM/100ML-% IVPB       Note to Pharmacy: Effie Rily O: cabinet override      01/22/23 1519 01/23/23 0329       Objective: Vitals:   01/30/23 0917 01/30/23 0931  BP: (!) 120/96 (!) 120/96  Pulse:  81  Resp:    Temp:    SpO2:      Intake/Output Summary (Last 24 hours) at 01/30/2023 1116 Last data filed at 01/30/2023 0800 Gross per 24 hour  Intake 800 ml  Output 1850 ml  Net -1050 ml   Filed Weights   01/22/23 1410 01/25/23 1003 01/29/23 0507  Weight: 91.6 kg 89.8 kg 89.6 kg   Weight change:  Body mass index is 30.03 kg/m.   Physical Exam: General exam: Pleasant, not in pain or distress. Skin: No rashes, lesions or ulcers. HEENT: Atraumatic, normocephalic, no obvious bleeding Lungs: Clear to auscultation bilaterally,  CVS: S1, S2, no murmur,   GI/Abd: Soft, nontender, nondistended, bowel sound present,   CNS: Alert, awake, oriented x 3 Psychiatry: Mood appropriate,  Extremities: No pedal edema, no calf tenderness, left AKA stump postsurgical status.  Wound VAC with serosanguineous drainage  Data Review: I have personally reviewed the laboratory  data and studies available.  F/u labs  Unresulted Labs (From admission, onward)    None      Total time spent in review of labs and imaging, patient evaluation, formulation of plan, documentation and communication with family: 45 minutes  Signed, Chapman Rota, MD [facility] 01/30/2023

## 2023-01-31 DIAGNOSIS — T8740 Infection of amputation stump, unspecified extremity: Secondary | ICD-10-CM | POA: Diagnosis not present

## 2023-01-31 LAB — GLUCOSE, CAPILLARY
Glucose-Capillary: 84 mg/dL (ref 70–99)
Glucose-Capillary: 118 mg/dL — ABNORMAL HIGH (ref 70–99)
Glucose-Capillary: 110 mg/dL — ABNORMAL HIGH (ref 70–99)
Glucose-Capillary: 153 mg/dL — ABNORMAL HIGH (ref 70–99)
Glucose-Capillary: 126 mg/dL — ABNORMAL HIGH (ref 70–99)

## 2023-01-31 MED ORDER — SODIUM CHLORIDE 0.9 % IV BOLUS
250.0000 mL | Freq: Once | INTRAVENOUS | Status: AC
  Administered 2023-01-31: 250 mL via INTRAVENOUS

## 2023-01-31 NOTE — Progress Notes (Signed)
  Progress Note    01/31/2023 8:49 AM 6 Days Post-Op  Subjective:  no complaints  Vitals:   01/30/23 2111 01/31/23 0631  BP: 130/62 104/82  Pulse: 76 67  Resp: 18 18  Temp: 98.3 F (36.8 C) 97.6 F (36.4 C)  SpO2: 100% 99%    Physical Exam: Incisions:  vac in place on suction     CBC    Component Value Date/Time   WBC 10.2 01/29/2023 0710   RBC 4.51 01/29/2023 0710   HGB 14.1 01/29/2023 0710   HCT 44.1 01/29/2023 0710   PLT 334 01/29/2023 0710   MCV 97.8 01/29/2023 0710   MCH 31.3 01/29/2023 0710   MCHC 32.0 01/29/2023 0710   RDW 15.5 01/29/2023 0710   LYMPHSABS 1.5 01/25/2023 0528   MONOABS 0.9 01/25/2023 0528   EOSABS 1.0 (H) 01/25/2023 0528   BASOSABS 0.1 01/25/2023 0528    BMET    Component Value Date/Time   NA 139 01/29/2023 0710   K 5.0 01/29/2023 0710   CL 107 01/29/2023 0710   CO2 21 (L) 01/29/2023 0710   GLUCOSE 118 (H) 01/29/2023 0710   BUN 10 01/29/2023 0710   CREATININE 1.01 01/29/2023 0710   CALCIUM  8.3 (L) 01/29/2023 0710   GFRNONAA >60 01/29/2023 0710   GFRAA 47 (L) 04/28/2017 0238    INR    Component Value Date/Time   INR 1.2 12/11/2022 1105     Intake/Output Summary (Last 24 hours) at 01/31/2023 0849 Last data filed at 01/30/2023 1700 Gross per 24 hour  Intake 240 ml  Output 1600 ml  Net -1360 ml     Assessment/Plan:  73 y.o. male is s/p I&D left AKA and wound vac 01/22/2023 and simple partial closure of left AKA and wound vac placement 01/25/2023 both by Dr. Magda.  6 Days Post-Op   -plan for vac change with Dr. Lanis tomorrow in the OR and possible attempt partial closure.  -Please make NPO midnight.  -Consent ordered  James Dougherty Serve MD [facility] Phone Number: [phone] 01/31/2023 8:49 AM

## 2023-01-31 NOTE — Plan of Care (Signed)
  Problem: Education: Goal: Knowledge of General Education information will improve Description: Including pain rating scale, medication(s)/side effects and non-pharmacologic comfort measures Outcome: Not Progressing   Problem: Health Behavior/Discharge Planning: Goal: Ability to manage health-related needs will improve Outcome: Not Progressing   Problem: Clinical Measurements: Goal: Ability to maintain clinical measurements within normal limits will improve Outcome: Not Progressing Goal: Will remain free from infection Outcome: Not Progressing Goal: Diagnostic test results will improve Outcome: Not Progressing Goal: Respiratory complications will improve Outcome: Not Progressing Goal: Cardiovascular complication will be avoided Outcome: Not Progressing   Problem: Activity: Goal: Risk for activity intolerance will decrease Outcome: Not Progressing   Problem: Nutrition: Goal: Adequate nutrition will be maintained Outcome: Not Progressing   Problem: Coping: Goal: Level of anxiety will decrease Outcome: Not Progressing   Problem: Elimination: Goal: Will not experience complications related to bowel motility Outcome: Not Progressing Goal: Will not experience complications related to urinary retention Outcome: Not Progressing   Problem: Pain Management: Goal: General experience of comfort will improve Outcome: Not Progressing   Problem: Safety: Goal: Ability to remain free from injury will improve Outcome: Not Progressing   Problem: Skin Integrity: Goal: Risk for impaired skin integrity will decrease Outcome: Not Progressing   Problem: Fluid Volume: Goal: Hemodynamic stability will improve Outcome: Not Progressing   Problem: Clinical Measurements: Goal: Diagnostic test results will improve Outcome: Not Progressing Goal: Signs and symptoms of infection will decrease Outcome: Not Progressing   Problem: Respiratory: Goal: Ability to maintain adequate ventilation  will improve Outcome: Not Progressing   Problem: Education: Goal: Ability to describe self-care measures that may prevent or decrease complications (Diabetes Survival Skills Education) will improve Outcome: Not Progressing Goal: Individualized Educational Video(s) Outcome: Not Progressing   Problem: Coping: Goal: Ability to adjust to condition or change in health will improve Outcome: Not Progressing   Problem: Fluid Volume: Goal: Ability to maintain a balanced intake and output will improve Outcome: Not Progressing   Problem: Health Behavior/Discharge Planning: Goal: Ability to identify and utilize available resources and services will improve Outcome: Not Progressing Goal: Ability to manage health-related needs will improve Outcome: Not Progressing   Problem: Metabolic: Goal: Ability to maintain appropriate glucose levels will improve Outcome: Not Progressing   Problem: Nutritional: Goal: Maintenance of adequate nutrition will improve Outcome: Not Progressing Goal: Progress toward achieving an optimal weight will improve Outcome: Not Progressing   Problem: Skin Integrity: Goal: Risk for impaired skin integrity will decrease Outcome: Not Progressing   Problem: Tissue Perfusion: Goal: Adequacy of tissue perfusion will improve Outcome: Not Progressing

## 2023-01-31 NOTE — Progress Notes (Signed)
 [facility] PROGRESS NOTE  James Dougherty  DOB: September 21, 1950  PCP: Atilano Deward ORN, MD FMW:[id number]  DOA: 01/22/2023  LOS: 9 days  Hospital Day: 10  Brief narrative: James Dougherty is a 73 y.o. male with PMH significant for obesity, DM2, HTN, HLD, stroke, PAD, critical right ICA stenosis, CKD, neuropathy, lymphedema.   Recently hospitalized 11/15 - 11/22, for nonhealing left lower extremity wound, underwent left AKA and subsequent discharged to subacute rehab. 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  However he did not have any fever or chills and hence did not require hospitalization. 12/27, SNF sent patient to the ED with drainage from the stump, low-grade fever  In the ED, patient was afebrile, hemodynamically stable Initial labs with WBC count 12,000, lactic acid 2.7 Admitted to [facility] Vascular surgery consulted 12/27 patient was taken to the OR by vascular surgery underwent I&D of the stump and application of wound. 12/23, repeat I&D and wound VAC placement done.  Subjective: Patient was seen and examined this morning.   Lying down in bed.  Not in distress.  Feels better after constipation was resolved.  Had 3 bowel movements in last 24 hours.  Assessment and plan: Severe sepsis POA Infected left AKA stump wound  S/p I&D of the stump and application of wound VAC - 12/27 Dr. Magda IntraOp culture from 12/27 showed MRSA. ID consulted.  Currently on oxygen 6 course of IV daptomycin . Per vascular surgery, patient would likely need to go back to the OR on Monday 1/for further I&D and wound VAC change. Continue with as needed pain med. Recent Labs  Lab 01/25/23 0528 01/26/23 0509 01/29/23 0710  WBC 7.3 7.8 10.2   PAD S/p Left AKA 11/15 Patient on aspirin , Plavix  and statin,  He does have critical ischemia on the right lower extremity as well, but this  will need to be addressed as an outpatient after the left AKA stump infection is better treated.  Will be scheduled for right lower extremity angiography 1/15 as an outpatient    Chronic combined systolic and diastolic congestive heart failure Hypertension Last TTE May/2023-LVEF 30-40% , g3dd PTA meds- Toprol  50 mg daily, Lasix  20 mg twice daily, Jardiance  10 mg daily Currently continued with Toprol  50 mg oral daily Continue to hold Lasix  and Jardiance  for now  Type 2 diabetes mellitus Diabetic neuropathy A1c 8.4 on November 2020 PTA meds-Levemir , Jardiance  and Trulicity  Currently on Levemir  10 units along with SSI/Accu-Cheks For neuropathy, continue Cymbalta  and gabapentin  Recent Labs  Lab 01/30/23 1149 01/30/23 1644 01/30/23 2218 01/31/23 0627 01/31/23 0854  GLUCAP 113* 128* 119* 84 118*   H/o stroke  right ICA stenosis HLD Continue with aspirin , Plavix  and statin.    CKD 3B Renal function at baseline, continue to monitor closely Recent Labs    12/15/22 0535 12/16/22 0749 12/17/22 0756 12/18/22 0656 01/22/23 1300 01/23/23 0534 01/24/23 0448 01/25/23 0528 01/26/23 0509 01/29/23 0710  BUN 19 20 18 16 23  27* 33* 26* 21 10  CREATININE 1.09 1.17 1.38* 1.17 1.58* 1.51* 1.31* 1.21 1.01 1.01    Obesity  Body mass index is 30.03 kg/m. Patient has been advised to make an attempt to improve diet and exercise patterns to aid in weight loss.  Constipation Feels better after constipation was resolved.  Had 3 bowel movements in last 24 hours. Continue scheduled Senokot and MiraLAX  PRN  Mobility: PT eval was obtained.  Recommend SNF  Goals of care   Code Status: Full Code     DVT prophylaxis:  enoxaparin  (LOVENOX ) injection 40 mg Start: 01/23/23 2200   Antimicrobials: Daptomycin  IV Fluid: None Consultants: ID, vascular surgery Family Communication: None at bedside  Status: Inpatient Level of care:  Med-Surg   Patient is from: SNF Needs to continue  in-hospital care: Pending repeat debridement in OR on Monday 1/6. Anticipated d/c to: May need reauthorization to go back to SNF.  Pending PT eval    Diet:  Diet Order             Diet NPO time specified Except for: Sips with Meds  Diet effective midnight           Diet heart healthy/carb modified Room service appropriate? Yes; Fluid consistency: Thin  Diet effective now                   Scheduled Meds:  aspirin  EC  81 mg Oral Daily   atorvastatin   80 mg Oral QHS   clopidogrel   75 mg Oral Daily   enoxaparin  (LOVENOX ) injection  40 mg Subcutaneous Q24H   gabapentin   600 mg Oral TID   insulin  aspart  0-15 Units Subcutaneous TID WC   insulin  aspart  0-5 Units Subcutaneous QHS   insulin  glargine-yfgn  10 Units Subcutaneous QHS   metoprolol  succinate  50 mg Oral Daily   senna-docusate  1 tablet Oral Daily    PRN meds: acetaminophen  **OR** acetaminophen , hydrALAZINE , HYDROcodone -acetaminophen , morphine  injection, ondansetron  (ZOFRAN ) IV, polyethylene glycol   Infusions:   DAPTOmycin  700 mg (01/30/23 1359)    Antimicrobials: Anti-infectives (From admission, onward)    Start     Dose/Rate Route Frequency Ordered Stop   01/29/23 1030  DAPTOmycin  (CUBICIN ) IVPB 700 mg/178mL premix        8 mg/kg  89.6 kg 200 mL/hr over 30 Minutes Intravenous Daily 01/29/23 0933     01/26/23 2100  vancomycin  (VANCOCIN ) 1,500 mg in sodium chloride  0.9 % 500 mL IVPB  Status:  Discontinued        1,500 mg 257.5 mL/hr over 120 Minutes Intravenous  Once 01/26/23 1328 01/26/23 1330   01/26/23 2100  vancomycin  (VANCOREADY) IVPB 1500 mg/300 mL        1,500 mg 150 mL/hr over 120 Minutes Intravenous Every 24 hours 01/26/23 1331 01/28/23 2257   01/25/23 2100  Vancomycin  (VANCOCIN ) 1,500 mg in sodium chloride  0.9 % 500 mL IVPB  Status:  Discontinued        1,500 mg 250 mL/hr over 120 Minutes Intravenous Every 24 hours 01/25/23 1354 01/26/23 1328   01/23/23 2100  vancomycin  (VANCOREADY) IVPB 1250  mg/250 mL  Status:  Discontinued        1,250 mg 166.7 mL/hr over 90 Minutes Intravenous Every 24 hours 01/22/23 1722 01/25/23 1354   01/23/23 1800  vancomycin  (VANCOREADY) IVPB 1250 mg/250 mL  Status:  Discontinued        1,250 mg 166.7 mL/hr over 90 Minutes Intravenous Every 24 hours 01/22/23 1617 01/22/23 1722   01/22/23 2055  ceFEPIme  (MAXIPIME ) 2 g in sodium chloride  0.9 % 100 mL IVPB  Status:  Discontinued        2 g 200 mL/hr over 30 Minutes Intravenous Every 12 hours 01/22/23 2055 01/26/23 1500   01/22/23 1745  Vancomycin  (VANCOCIN ) 1,500 mg in sodium chloride  0.9 % 500 mL IVPB        1,500 mg  250 mL/hr over 120 Minutes Intravenous  Once 01/22/23 1617 01/23/23 0800   01/22/23 1730  ceFEPIme  (MAXIPIME ) 2 g in sodium chloride  0.9 % 100 mL IVPB  Status:  Discontinued        2 g 200 mL/hr over 30 Minutes Intravenous Every 8 hours 01/22/23 1617 01/22/23 2055   01/22/23 1519  ceFAZolin  (ANCEF ) 2-4 GM/100ML-% IVPB       Note to Pharmacy: Effie Rily O: cabinet override      01/22/23 1519 01/23/23 0329       Objective: Vitals:   01/31/23 0631 01/31/23 0852  BP: 104/82 (!) 101/54  Pulse: 67 (!) 59  Resp: 18 16  Temp: 97.6 F (36.4 C)   SpO2: 99%     Intake/Output Summary (Last 24 hours) at 01/31/2023 1126 Last data filed at 01/31/2023 0919 Gross per 24 hour  Intake 240 ml  Output 2500 ml  Net -2260 ml   Filed Weights   01/22/23 1410 01/25/23 1003 01/29/23 0507  Weight: 91.6 kg 89.8 kg 89.6 kg   Weight change:  Body mass index is 30.03 kg/m.   Physical Exam: General exam: Pleasant, not in pain or distress. Skin: No rashes, lesions or ulcers. HEENT: Atraumatic, normocephalic, no obvious bleeding Lungs: Clear to auscultation bilaterally,  CVS: S1, S2, no murmur,   GI/Abd: Soft, nontender, nondistended, bowel sound present,   CNS: Alert, awake, oriented x 3 Psychiatry: Mood appropriate,  Extremities: No pedal edema, no calf tenderness, left AKA stump postsurgical  status.  Wound VAC with serosanguineous drainage  Data Review: I have personally reviewed the laboratory data and studies available.  F/u labs  Unresulted Labs (From admission, onward)     Start     Ordered   02/01/23 0500  Basic metabolic panel  Tomorrow morning,   R       Question:  Specimen collection method  Answer:  Lab=Lab collect   01/31/23 0854   02/01/23 0500  CBC with Differential/Platelet  Tomorrow morning,   R       Question:  Specimen collection method  Answer:  Lab=Lab collect   01/31/23 0854           Total time spent in review of labs and imaging, patient evaluation, formulation of plan, documentation and communication with family: 45 minutes  Signed, Chapman Rota, MD [facility] 01/31/2023

## 2023-01-31 NOTE — Plan of Care (Signed)
  Problem: Nutrition: Goal: Adequate nutrition will be maintained Outcome: Progressing   Problem: Elimination: Goal: Will not experience complications related to urinary retention Outcome: Progressing   Problem: Pain Management: Goal: General experience of comfort will improve Outcome: Progressing

## 2023-02-01 ENCOUNTER — Other Ambulatory Visit: Payer: Self-pay

## 2023-02-01 ENCOUNTER — Encounter (HOSPITAL_COMMUNITY): Payer: Medicare Other

## 2023-02-01 ENCOUNTER — Inpatient Hospital Stay (HOSPITAL_COMMUNITY): Payer: Medicare Other | Admitting: Anesthesiology

## 2023-02-01 ENCOUNTER — Encounter (HOSPITAL_COMMUNITY)
Admission: EM | Disposition: A | Discharge status: Skilled Nursing Facility | Payer: Self-pay | Source: Skilled Nursing Facility | Attending: Internal Medicine

## 2023-02-01 ENCOUNTER — Encounter (HOSPITAL_COMMUNITY): Payer: Self-pay | Admitting: Internal Medicine

## 2023-02-01 DIAGNOSIS — I509 Heart failure, unspecified: Secondary | ICD-10-CM | POA: Diagnosis not present

## 2023-02-01 DIAGNOSIS — A419 Sepsis, unspecified organism: Secondary | ICD-10-CM | POA: Diagnosis not present

## 2023-02-01 DIAGNOSIS — N1832 Chronic kidney disease, stage 3b: Secondary | ICD-10-CM | POA: Diagnosis not present

## 2023-02-01 DIAGNOSIS — I110 Hypertensive heart disease with heart failure: Secondary | ICD-10-CM | POA: Diagnosis not present

## 2023-02-01 DIAGNOSIS — E119 Type 2 diabetes mellitus without complications: Secondary | ICD-10-CM

## 2023-02-01 DIAGNOSIS — T8744 Infection of amputation stump, left lower extremity: Secondary | ICD-10-CM

## 2023-02-01 DIAGNOSIS — T8144XA Sepsis following a procedure, initial encounter: Secondary | ICD-10-CM | POA: Diagnosis not present

## 2023-02-01 DIAGNOSIS — E118 Type 2 diabetes mellitus with unspecified complications: Secondary | ICD-10-CM

## 2023-02-01 DIAGNOSIS — T8740 Infection of amputation stump, unspecified extremity: Secondary | ICD-10-CM | POA: Diagnosis not present

## 2023-02-01 HISTORY — PX: I & D EXTREMITY: SHX5045

## 2023-02-01 HISTORY — PX: DRESSING CHANGE UNDER ANESTHESIA: SHX5237

## 2023-02-01 HISTORY — PX: STUMP REVISION: SHX6102

## 2023-02-01 LAB — CBC WITH DIFFERENTIAL/PLATELET
Abs Immature Granulocytes: 0.1 10*3/uL — ABNORMAL HIGH (ref 0.00–0.07)
Basophils Absolute: 0.1 10*3/uL (ref 0.0–0.1)
Basophils Relative: 1 %
Eosinophils Absolute: 0.7 10*3/uL — ABNORMAL HIGH (ref 0.0–0.5)
Eosinophils Relative: 9 %
HCT: 35 % — ABNORMAL LOW (ref 39.0–52.0)
Hemoglobin: 11 g/dL — ABNORMAL LOW (ref 13.0–17.0)
Immature Granulocytes: 1 %
Lymphocytes Relative: 30 %
Lymphs Abs: 2.4 10*3/uL (ref 0.7–4.0)
MCH: 31.3 pg (ref 26.0–34.0)
MCHC: 31.4 g/dL (ref 30.0–36.0)
MCV: 99.7 fL (ref 80.0–100.0)
Monocytes Absolute: 0.7 10*3/uL (ref 0.1–1.0)
Monocytes Relative: 9 %
Neutro Abs: 3.9 10*3/uL (ref 1.7–7.7)
Neutrophils Relative %: 50 %
Platelets: 309 10*3/uL (ref 150–400)
RBC: 3.51 MIL/uL — ABNORMAL LOW (ref 4.22–5.81)
RDW: 15.7 % — ABNORMAL HIGH (ref 11.5–15.5)
WBC: 7.8 10*3/uL (ref 4.0–10.5)
nRBC: 0 % (ref 0.0–0.2)

## 2023-02-01 LAB — BASIC METABOLIC PANEL
Anion gap: 8 (ref 5–15)
BUN: 11 mg/dL (ref 8–23)
CO2: 23 mmol/L (ref 22–32)
Calcium: 8 mg/dL — ABNORMAL LOW (ref 8.9–10.3)
Chloride: 108 mmol/L (ref 98–111)
Creatinine, Ser: 0.99 mg/dL (ref 0.61–1.24)
GFR, Estimated: >60 mL/min (ref >60)
Glucose, Bld: 93 mg/dL (ref 70–99)
Potassium: 4.5 mmol/L (ref 3.5–5.1)
Sodium: 139 mmol/L (ref 135–145)

## 2023-02-01 LAB — GLUCOSE, CAPILLARY
Glucose-Capillary: 91 mg/dL (ref 70–99)
Glucose-Capillary: 99 mg/dL (ref 70–99)
Glucose-Capillary: 89 mg/dL (ref 70–99)
Glucose-Capillary: 108 mg/dL — ABNORMAL HIGH (ref 70–99)
Glucose-Capillary: 169 mg/dL — ABNORMAL HIGH (ref 70–99)

## 2023-02-01 LAB — MINIMUM INHIBITORY CONC. (1 DRUG)

## 2023-02-01 LAB — MIC RESULT

## 2023-02-01 SURGERY — IRRIGATION AND DEBRIDEMENT EXTREMITY
Anesthesia: General | Site: Leg Upper | Laterality: Left

## 2023-02-01 MED ORDER — PROPOFOL 500 MG/50ML IV EMUL
INTRAVENOUS | Status: AC
  Filled 2023-02-01: qty 100

## 2023-02-01 MED ORDER — LIDOCAINE 2% (20 MG/ML) 5 ML SYRINGE
INTRAMUSCULAR | Status: DC | PRN
  Administered 2023-02-01: 40 mg via INTRAVENOUS

## 2023-02-01 MED ORDER — INSULIN ASPART 100 UNIT/ML IJ SOLN
0.0000 [IU] | INTRAMUSCULAR | Status: DC | PRN
Start: 2023-02-01 — End: 2023-02-01

## 2023-02-01 MED ORDER — FENTANYL CITRATE (PF) 100 MCG/2ML IJ SOLN
25.0000 ug | INTRAMUSCULAR | Status: DC | PRN
  Administered 2023-02-01 (×3): 50 ug via INTRAVENOUS

## 2023-02-01 MED ORDER — ONDANSETRON HCL 4 MG/2ML IJ SOLN
INTRAMUSCULAR | Status: DC | PRN
  Administered 2023-02-01: 4 mg via INTRAVENOUS

## 2023-02-01 MED ORDER — MIDAZOLAM HCL 2 MG/2ML IJ SOLN
INTRAMUSCULAR | Status: AC
Start: 2023-02-01 — End: ?
  Filled 2023-02-01: qty 2

## 2023-02-01 MED ORDER — ROCURONIUM BROMIDE 10 MG/ML (PF) SYRINGE
PREFILLED_SYRINGE | INTRAVENOUS | Status: DC | PRN
  Administered 2023-02-01: 60 mg via INTRAVENOUS

## 2023-02-01 MED ORDER — PHENYLEPHRINE 80 MCG/ML (10ML) SYRINGE FOR IV PUSH (FOR BLOOD PRESSURE SUPPORT)
PREFILLED_SYRINGE | INTRAVENOUS | Status: AC
  Filled 2023-02-01: qty 10

## 2023-02-01 MED ORDER — FENTANYL CITRATE (PF) 100 MCG/2ML IJ SOLN
INTRAMUSCULAR | Status: AC
  Filled 2023-02-01: qty 2

## 2023-02-01 MED ORDER — PHENYLEPHRINE HCL-NACL 20-0.9 MG/250ML-% IV SOLN
INTRAVENOUS | Status: DC | PRN
  Administered 2023-02-01: 40 ug/min via INTRAVENOUS

## 2023-02-01 MED ORDER — LIDOCAINE 2% (20 MG/ML) 5 ML SYRINGE
INTRAMUSCULAR | Status: AC
  Filled 2023-02-01: qty 5

## 2023-02-01 MED ORDER — HYDROCODONE-ACETAMINOPHEN 5-325 MG PO TABS
ORAL_TABLET | ORAL | Status: AC
  Filled 2023-02-01: qty 2

## 2023-02-01 MED ORDER — ORAL CARE MOUTH RINSE: 15.0000 mL | Freq: Once | OROMUCOSAL | Status: AC

## 2023-02-01 MED ORDER — ACETAMINOPHEN 10 MG/ML IV SOLN: 1000.0000 mg | Freq: Once | INTRAVENOUS | Status: DC | PRN

## 2023-02-01 MED ORDER — SUGAMMADEX SODIUM 200 MG/2ML IV SOLN
INTRAVENOUS | Status: DC | PRN
  Administered 2023-02-01: 200 mg via INTRAVENOUS

## 2023-02-01 MED ORDER — MIDAZOLAM HCL 2 MG/2ML IJ SOLN
INTRAMUSCULAR | Status: DC | PRN
  Administered 2023-02-01: .5 mg via INTRAVENOUS

## 2023-02-01 MED ORDER — FENTANYL CITRATE (PF) 250 MCG/5ML IJ SOLN
INTRAMUSCULAR | Status: AC
  Filled 2023-02-01: qty 5

## 2023-02-01 MED ORDER — ROCURONIUM BROMIDE 10 MG/ML (PF) SYRINGE
PREFILLED_SYRINGE | INTRAVENOUS | Status: AC
  Filled 2023-02-01: qty 10

## 2023-02-01 MED ORDER — FENTANYL CITRATE (PF) 250 MCG/5ML IJ SOLN
INTRAMUSCULAR | Status: DC | PRN
  Administered 2023-02-01 (×5): 50 ug via INTRAVENOUS

## 2023-02-01 MED ORDER — LACTATED RINGERS IV SOLN: INTRAVENOUS | Status: DC

## 2023-02-01 MED ORDER — ONDANSETRON HCL 4 MG/2ML IJ SOLN
INTRAMUSCULAR | Status: AC
  Filled 2023-02-01: qty 2

## 2023-02-01 MED ORDER — CHLORHEXIDINE GLUCONATE 0.12 % MT SOLN
15.0000 mL | Freq: Once | OROMUCOSAL | Status: AC
Start: 2023-02-01 — End: 2023-02-01

## 2023-02-01 MED ORDER — PROPOFOL 10 MG/ML IV BOLUS
INTRAVENOUS | Status: AC
  Filled 2023-02-01: qty 20

## 2023-02-01 MED ORDER — PROPOFOL 10 MG/ML IV BOLUS
INTRAVENOUS | Status: DC | PRN
  Administered 2023-02-01: 20 mg via INTRAVENOUS
  Administered 2023-02-01: 50 mg via INTRAVENOUS

## 2023-02-01 MED ORDER — PHENYLEPHRINE 80 MCG/ML (10ML) SYRINGE FOR IV PUSH (FOR BLOOD PRESSURE SUPPORT)
PREFILLED_SYRINGE | INTRAVENOUS | Status: DC | PRN
  Administered 2023-02-01: 80 ug via INTRAVENOUS

## 2023-02-01 MED ORDER — PROPOFOL 1000 MG/100ML IV EMUL
INTRAVENOUS | Status: AC
  Filled 2023-02-01: qty 100

## 2023-02-01 MED ORDER — 0.9 % SODIUM CHLORIDE (POUR BTL) OPTIME
TOPICAL | Status: DC | PRN
  Administered 2023-02-01 (×2): 1000 mL

## 2023-02-01 MED ORDER — CEFAZOLIN SODIUM 1 G IJ SOLR
INTRAMUSCULAR | Status: AC
  Filled 2023-02-01: qty 20

## 2023-02-01 MED ORDER — CEFAZOLIN SODIUM-DEXTROSE 2-3 GM-%(50ML) IV SOLR
INTRAVENOUS | Status: DC | PRN
  Administered 2023-02-01: 2 g via INTRAVENOUS

## 2023-02-01 MED ORDER — POLYETHYLENE GLYCOL 3350 17 G PO PACK
17.0000 g | PACK | Freq: Two times a day (BID) | ORAL | Status: AC
  Administered 2023-02-01 – 2023-02-02 (×4): 17 g via ORAL
  Filled 2023-02-01 (×5): qty 1

## 2023-02-01 MED ORDER — CHLORHEXIDINE GLUCONATE 0.12 % MT SOLN
OROMUCOSAL | Status: AC
  Administered 2023-02-01: 15 mL via OROMUCOSAL
  Filled 2023-02-01: qty 15

## 2023-02-01 MED ORDER — ONDANSETRON HCL 4 MG/2ML IJ SOLN: 4.0000 mg | Freq: Once | INTRAMUSCULAR | Status: DC | PRN

## 2023-02-01 SURGICAL SUPPLY — 26 items
BAG COUNTER SPONGE SURGICOUNT (BAG) ×2 IMPLANT
BLADE SAW SGTL 73X25 THK (BLADE) ×2 IMPLANT
CANISTER SUCT 3000ML PPV (MISCELLANEOUS) ×2 IMPLANT
COVER SURGICAL LIGHT HANDLE (MISCELLANEOUS) ×2 IMPLANT
DRAPE HALF SHEET 40X57 (DRAPES) ×2 IMPLANT
DRAPE INCISE IOBAN 66X45 STRL (DRAPES) ×2 IMPLANT
DRAPE SURG ORHT 6 SPLT 77X108 (DRAPES) ×4 IMPLANT
DRESSING MORCELLS FINE 1000 (Tissue) ×2 IMPLANT
DRESSING VERAFLO CLEANS CC MED (GAUZE/BANDAGES/DRESSINGS) IMPLANT
DRSG VERAFLO CLEANSE CC MED (GAUZE/BANDAGES/DRESSINGS) ×2 IMPLANT
ELECT REM PT RETURN 9FT ADLT (ELECTROSURGICAL) ×2 IMPLANT
ELECTRODE REM PT RTRN 9FT ADLT (ELECTROSURGICAL) ×1 IMPLANT
GLOVE BIOGEL PI IND STRL 8 (GLOVE) ×2 IMPLANT
GOWN STRL REUS W/ TWL LRG LVL3 (GOWN DISPOSABLE) ×4 IMPLANT
GOWN STRL REUS W/TWL 2XL LVL3 (GOWN DISPOSABLE) ×4 IMPLANT
KIT BASIN OR (CUSTOM PROCEDURE TRAY) ×2 IMPLANT
KIT TURNOVER KIT B (KITS) ×2 IMPLANT
NS IRRIG 1000ML POUR BTL (IV SOLUTION) ×3 IMPLANT
PACK GENERAL/GYN (CUSTOM PROCEDURE TRAY) ×2 IMPLANT
PAD ARMBOARD 7.5X6 YLW CONV (MISCELLANEOUS) ×4 IMPLANT
PAD NEG PRESSURE SENSATRAC (MISCELLANEOUS) ×1 IMPLANT
SUT BONE WAX W31G (SUTURE) ×1 IMPLANT
SUT VIC AB 2-0 CT1 18 (SUTURE) ×4 IMPLANT
TOWEL GREEN STERILE (TOWEL DISPOSABLE) ×4 IMPLANT
UNDERPAD 30X36 HEAVY ABSORB (UNDERPADS AND DIAPERS) ×2 IMPLANT
WATER STERILE IRR 1000ML POUR (IV SOLUTION) ×2 IMPLANT

## 2023-02-01 NOTE — Transfer of Care (Signed)
 Immediate Anesthesia Transfer of Care Note  Patient: James Dougherty  Procedure(s) Performed: AMPUTATION BELOW KNEE DEBRIDMENT (Left: Knee)  Patient Location: PACU  Anesthesia Type:General  Level of Consciousness: awake, drowsy, patient cooperative, and responds to stimulation  Airway & Oxygen Therapy: Patient Spontanous Breathing and Patient connected to nasal cannula oxygen  Post-op Assessment: Report given to RN and Post -op Vital signs reviewed and stable  Post vital signs: Reviewed and stable  Last Vitals:  Vitals Value Taken Time  BP 127/61 02/01/23 0947  Temp    Pulse 66 02/01/23 0948  Resp 12 02/01/23 0948  SpO2 100 % 02/01/23 0948  Vitals shown include unfiled device data.  Last Pain:  Vitals:   02/01/23 0755  TempSrc:   PainSc: 2       Patients Stated Pain Goal: 0 (01/31/23 2041)  Complications: No notable events documented.

## 2023-02-01 NOTE — Plan of Care (Signed)
  Problem: Education: Goal: Knowledge of General Education information will improve Description: Including pain rating scale, medication(s)/side effects and non-pharmacologic comfort measures Outcome: Not Progressing   Problem: Health Behavior/Discharge Planning: Goal: Ability to manage health-related needs will improve Outcome: Not Progressing   Problem: Clinical Measurements: Goal: Ability to maintain clinical measurements within normal limits will improve Outcome: Not Progressing Goal: Will remain free from infection Outcome: Not Progressing Goal: Diagnostic test results will improve Outcome: Not Progressing Goal: Respiratory complications will improve Outcome: Not Progressing Goal: Cardiovascular complication will be avoided Outcome: Not Progressing   Problem: Activity: Goal: Risk for activity intolerance will decrease Outcome: Not Progressing   Problem: Nutrition: Goal: Adequate nutrition will be maintained Outcome: Not Progressing   Problem: Coping: Goal: Level of anxiety will decrease Outcome: Not Progressing   Problem: Pain Management: Goal: General experience of comfort will improve Outcome: Not Progressing   Problem: Safety: Goal: Ability to remain free from injury will improve Outcome: Not Progressing   Problem: Skin Integrity: Goal: Risk for impaired skin integrity will decrease Outcome: Not Progressing   Problem: Fluid Volume: Goal: Hemodynamic stability will improve Outcome: Not Progressing   Problem: Respiratory: Goal: Ability to maintain adequate ventilation will improve Outcome: Not Progressing   Problem: Education: Goal: Ability to describe self-care measures that may prevent or decrease complications (Diabetes Survival Skills Education) will improve Outcome: Not Progressing Goal: Individualized Educational Video(s) Outcome: Not Progressing   Problem: Coping: Goal: Ability to adjust to condition or change in health will improve Outcome:  Not Progressing   Problem: Fluid Volume: Goal: Ability to maintain a balanced intake and output will improve Outcome: Not Progressing   Problem: Health Behavior/Discharge Planning: Goal: Ability to identify and utilize available resources and services will improve Outcome: Not Progressing Goal: Ability to manage health-related needs will improve Outcome: Not Progressing   Problem: Metabolic: Goal: Ability to maintain appropriate glucose levels will improve Outcome: Not Progressing   Problem: Nutritional: Goal: Maintenance of adequate nutrition will improve Outcome: Not Progressing Goal: Progress toward achieving an optimal weight will improve Outcome: Not Progressing   Problem: Skin Integrity: Goal: Risk for impaired skin integrity will decrease Outcome: Not Progressing   Problem: Tissue Perfusion: Goal: Adequacy of tissue perfusion will improve Outcome: Not Progressing

## 2023-02-01 NOTE — Anesthesia Postprocedure Evaluation (Signed)
 Anesthesia Post Note  Patient: James Dougherty  Procedure(s) Performed: IRRIGATION AND DEBRIDMENT LEFT ABOVE KNEE AMPUTATION (Left: Leg Upper) AMPUTATION REVISION (Left: Leg Upper) WOUND VAC DRESSING CHANGE (Left: Leg Upper)     Patient location during evaluation: PACU Anesthesia Type: General Level of consciousness: awake and alert Pain management: pain level controlled Vital Signs Assessment: post-procedure vital signs reviewed and stable Respiratory status: spontaneous breathing, nonlabored ventilation, respiratory function stable and patient connected to nasal cannula oxygen Cardiovascular status: blood pressure returned to baseline and stable Postop Assessment: no apparent nausea or vomiting Anesthetic complications: no   No notable events documented.  Last Vitals:  Vitals:   02/01/23 1045 02/01/23 1112  BP: (!) 123/47 116/64  Pulse: 61 64  Resp: 12 20  Temp: 37.2 C 36.4 C  SpO2: 94% 100%    Last Pain:  Vitals:   02/01/23 1112  TempSrc: Oral  PainSc:                  Lynwood MARLA Cornea

## 2023-02-01 NOTE — Progress Notes (Addendum)
 VAC clotted with some postop bleeding.  Dressing taken down, suture placed in the sub cutaneous fat.  No surgical bleeding. IV pain meds administered.  Dry dressing placed.   Hold plavix  at this time.  Dressing to be removed Tuesday v Wednesday.    Appreciate nursing help  Fonda FORBES Rim MD

## 2023-02-01 NOTE — Progress Notes (Addendum)
  Progress Note    02/01/2023 7:11 AM 7 Days Post-Op  Subjective:  sleeping-I did not wake him  afebrile  Vitals:   01/31/23 2027 02/01/23 0440  BP: (!) 82/56 (!) 86/58  Pulse:  (!) 57  Resp: 14 16  Temp: 98.5 F (36.9 C) 98.2 F (36.8 C)  SpO2: 97% 97%    Physical Exam: General:  no distress Lungs:  non labored   CBC    Component Value Date/Time   WBC 7.8 02/01/2023 0459   RBC 3.51 (L) 02/01/2023 0459   HGB 11.0 (L) 02/01/2023 0459   HCT 35.0 (L) 02/01/2023 0459   PLT 309 02/01/2023 0459   MCV 99.7 02/01/2023 0459   MCH 31.3 02/01/2023 0459   MCHC 31.4 02/01/2023 0459   RDW 15.7 (H) 02/01/2023 0459   LYMPHSABS 2.4 02/01/2023 0459   MONOABS 0.7 02/01/2023 0459   EOSABS 0.7 (H) 02/01/2023 0459   BASOSABS 0.1 02/01/2023 0459    BMET    Component Value Date/Time   NA 139 02/01/2023 0459   K 4.5 02/01/2023 0459   CL 108 02/01/2023 0459   CO2 23 02/01/2023 0459   GLUCOSE 93 02/01/2023 0459   BUN 11 02/01/2023 0459   CREATININE 0.99 02/01/2023 0459   CALCIUM  8.0 (L) 02/01/2023 0459   GFRNONAA >60 02/01/2023 0459   GFRAA 47 (L) 04/28/2017 0238    INR    Component Value Date/Time   INR 1.2 12/11/2022 1105     Intake/Output Summary (Last 24 hours) at 02/01/2023 0711 Last data filed at 01/31/2023 2041 Gross per 24 hour  Intake 340 ml  Output 900 ml  Net -560 ml      Assessment/Plan:  73 y.o. male is s/p:  I&D left AKA and wound vac 01/22/2023 and simple partial closure of left AKA and wound vac placement 01/25/2023 both by Dr. Magda.   7 Days Post-Op   -plan for OR this morning with Dr. Lanis for washout of left AKA and possible attempt at closure.     Lucie Apt, PA-C [facility] [phone] 02/01/2023 7:11 AM  VASCULAR STAFF ADDENDUM: I have independently interviewed and examined the patient. I agree with the above.  Plan for left leg washout and debridement today. After discussing the risks and benefits of AKA  washout, James Dougherty elected to proceed.   Fonda FORBES Lanis MD [facility] Phone Number: [phone] 02/01/2023 7:51 AM

## 2023-02-01 NOTE — Consult Note (Signed)
 WOC Nurse wound follow up Wound type: left AKA and wound VAC placed follow by the vascular team.  Today, the pt was assisted on the ED, the vascular team suture the wound bed and suspended the VAC, after clot on the machine again, interrupting the suction.  WOC team will not plan to follow further.  Please reconsult if further assistance is needed. Thank-you,  Lela Holm BSN, RN, [employer], WOC  (Pager: [phone])

## 2023-02-01 NOTE — Progress Notes (Signed)
 OT Cancellation Note  Patient Details Name: James Dougherty MRN: [id number] DOB: [birth year]   Cancelled Treatment:    Reason Eval/Treat Not Completed: Patient at procedure or test/ unavailable- will follow and see as able.   Etta NOVAK, OT Acute Rehabilitation Services Office [phone]   Etta GORMAN Hope 02/01/2023, 8:44 AM

## 2023-02-01 NOTE — Progress Notes (Signed)
 [facility] PROGRESS NOTE    Progress Note  James Dougherty  FMW:[id number] DOB: 28-Sep-1950 DOA: 01/22/2023 PCP: Atilano Deward ORN, MD     Brief Narrative:   James Dougherty is an 73 y.o. male past medical history significant for obesity, diabetes mellitus type 2, essential hypertension stroke critical right ICA stenosis, chronic kidney disease, lymphedema recently discharged from the hospital on 12/18/2022 for nonhealing left lower extremity wound, underwent left AKA and subsequently discharged to rehab on 01/18/2023 was seen by vascular surgery at the office noted that the left BKA was draining and the lateral aspect of the stump was concerning for infection on 01/22/2023 was sent from skilled due to draining of the stump and low-grade fever   Assessment/Plan:   Amputation stump infection (HCC) Started empirically on IV antibiotics on admission. Status post I&D of AKA and wound VAC on 01/22/2023 and simple partial closure of left AKA wound VAC placement on 01/25/2023. Intraoperative cultures on 01/22/2023 grew MRSA. ID was consulted recommended IV daptomycin . Vascular surgery recommended repeat OR intervention on 02/01/2023 partial closure and wound VAC change.    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 Patient on aspirin  Plavix  and statin. He will need angiogram of the left AKA as an outpatient on 02/10/2023.  Chronic combined systolic and diastolic heart failure/essential hypertension: Currently on metoprolol . Lasix  and Jardiance  are on hold for surgical procedure.  Diabetes mellitus type 2 uncontrolled with peripheral neuropathy: With an A1c of 8.4. Continue long-acting insulin  plus sliding scale. Continue Cymbalta  and Neurontin .  History of CVA/right ICA stenosis/hyperlipidemia: Continue aspirin  Plavix  and statins.  Chronic kidney disease stage IIIb: His creatinine is at baseline.  Obesity: Counseling.  Constipation: Had  multiple bowel movements he feels better. Continue MiraLAX  twice a day.  Coccygeal pressure ulcer unstageable present on admission: RN Pressure Injury Documentation: Pressure Injury 11/04/21 Coccyx Mid Deep Tissue Pressure Injury - Purple or maroon localized area of discolored intact skin or blood-filled blister due to damage of underlying soft tissue from pressure and/or shear. non-blanchable purple area surrounded  (Active)  11/04/21 1634  Location: Coccyx  Location Orientation: Mid  Staging: Deep Tissue Pressure Injury - Purple or maroon localized area of discolored intact skin or blood-filled blister due to damage of underlying soft tissue from pressure and/or shear.  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 (Active)  12/11/22 1915  Location: Coccyx  Location Orientation:   Staging: Stage 2 -  Partial thickness loss of dermis presenting as a shallow open injury with a red, pink wound bed without slough.  Wound Description (Comments):   Present on Admission: Yes  Dressing Type Negative pressure wound therapy 01/31/23 0000    Estimated body mass index is 30.03 kg/m as calculated from the following:   Height as of this encounter: 5' 8 (1.727 m).   Weight as of this encounter: 89.6 kg.   DVT prophylaxis: lovenox  Family Communication:none Status is: Inpatient Remains inpatient appropriate because: Stump infection    Code Status:     Code Status Orders  (From admission, onward)           Start     Ordered   01/22/23 1639  Full code  Continuous       Question:  By:  Answer:  Consent: discussion documented in EHR   01/22/23 1640           Code Status History     Date Active Date Inactive Code Status Order ID Comments User Context    12/11/2022 1522 12/18/2022 1906 Full Code [id number]  Vernon Ranks, MD Inpatient   09/30/2022 1153 09/30/2022 1943 Full Code [id number]  Lanis Fonda BRAVO, MD Inpatient   11/04/2021 1600 11/14/2021 1944 Full Code [id number]  Maurice Sharlet RAMAN, PA-C Inpatient   10/28/2021 1953 11/04/2021 1545 Full Code [id number]  Sebastian Moles, MD ED   04/19/2018 1017 04/19/2018 1744 Full Code [id number]  Serene Gaile ORN, MD Inpatient   04/26/2017 1848 04/28/2017 1826 Full Code [id number]  Bethanie Donnice RIGGERS Inpatient   04/01/2017 2231 04/06/2017 2009 Full Code [id number]  Luke Agent, MD Inpatient      Advance Directive Documentation    Flowsheet Row Most Recent Value  Type of Advance Directive Healthcare Power of Attorney  Pre-existing out of facility DNR order (yellow form or pink MOST form) --  MOST Form in Place? --         IV Access:   Peripheral IV   Procedures and diagnostic studies:   No results found.   Medical Consultants:   None.   Subjective:    CAIDE CAMPI he relates he is in pain this morning.  Objective:    Vitals:   02/01/23 1015 02/01/23 1030 02/01/23 1045 02/01/23 1112  BP: 114/63 (!) 119/39 (!) 123/47 116/64  Pulse: 82 67 61 64  Resp: 17 14 12 20   Temp:   98.9 F (37.2 C) 97.6 F (36.4 C)  TempSrc:    Oral  SpO2: 98% 100% 94% 100%  Weight:      Height:       SpO2: 100 % O2 Flow Rate (L/min): 3 L/min   Intake/Output Summary (Last 24 hours) at 02/01/2023 1144 Last data filed at 02/01/2023 1052 Gross per 24 hour  Intake 650 ml  Output 120 ml  Net 530 ml   Filed Weights   01/22/23 1410 01/25/23 1003 01/29/23 0507  Weight: 91.6 kg 89.8 kg 89.6 kg    Exam: General exam: In no acute distress. Respiratory system: Good air movement and clear to auscultation. Cardiovascular system: S1 & S2 heard, RRR. No JVD. Gastrointestinal system: Abdomen is nondistended, soft and nontender.  Extremities: No pedal edema. Skin: No rashes, lesions or ulcers Psychiatry:  Judgement and insight appear normal. Mood & affect appropriate.    Data Reviewed:    Labs: Basic Metabolic Panel: Recent Labs  Lab 01/26/23 0509 01/29/23 0710 02/01/23 0459  NA 140 139 139  K 4.3 5.0 4.5  CL 111 107 108  CO2 20* 21* 23  GLUCOSE 139* 118* 93  BUN 21 10 11   CREATININE 1.01 1.01 0.99  CALCIUM  8.1* 8.3* 8.0*  MG 2.3 2.2  --    GFR Estimated Creatinine Clearance: 73.4 mL/min (by C-G formula based on SCr of 0.99 mg/dL). Liver Function Tests: Recent Labs  Lab 01/26/23 0509 01/29/23 0710  AST 32 35  ALT 30 48*  ALKPHOS 68 78  BILITOT 0.5 0.9  PROT 5.5* 5.7*  ALBUMIN  2.2* 2.4*   No results for input(s): LIPASE, AMYLASE in the last 168 hours. No results for input(s): AMMONIA in the last 168 hours. Coagulation profile No results for input(s): INR, PROTIME in the last 168 hours. COVID-19 Labs  No results for input(s): DDIMER, FERRITIN, LDH, CRP in the last 72  hours.  Lab Results  Component Value Date   SARSCOV2NAA NEGATIVE 10/28/2021    CBC: Recent Labs  Lab 01/26/23 0509 01/29/23 0710 02/01/23 0459  WBC 7.8 10.2 7.8  NEUTROABS  --   --  3.9  HGB 12.6* 14.1 11.0*  HCT 37.3* 44.1 35.0*  MCV 95.9 97.8 99.7  PLT 329 334 309   Cardiac Enzymes: Recent Labs  Lab 01/28/23 1135  CKTOTAL 30*   BNP (last 3 results) No results for input(s): PROBNP in the last 8760 hours. CBG: Recent Labs  Lab 01/31/23 1653 01/31/23 2104 02/01/23 0803 02/01/23 0949 02/01/23 1111  GLUCAP 153* 126* 91 99 89   D-Dimer: No results for input(s): DDIMER in the last 72 hours. Hgb A1c: No results for input(s): HGBA1C in the last 72 hours. Lipid Profile: No results for input(s): CHOL, HDL, LDLCALC, TRIG, CHOLHDL, LDLDIRECT in the last 72 hours. Thyroid  function studies: No results for input(s): TSH, T4TOTAL, T3FREE, THYROIDAB in the last 72 hours.  Invalid input(s): FREET3 Anemia work up: No results for input(s):  VITAMINB12, FOLATE, FERRITIN, TIBC, IRON, RETICCTPCT in the last 72 hours. Sepsis Labs: Recent Labs  Lab 01/26/23 0509 01/29/23 0710 02/01/23 0459  WBC 7.8 10.2 7.8   Microbiology Recent Results (from the past 240 hours)  Blood Cultures x 2 sites     Status: None   Collection Time: 01/22/23 12:40 PM   Specimen: BLOOD  Result Value Ref Range Status   Specimen Description BLOOD SITE NOT SPECIFIED  Final   Special Requests   Final    BOTTLES DRAWN AEROBIC AND ANAEROBIC Blood Culture results may not be optimal due to an inadequate volume of blood received in culture bottles   Culture   Final    NO GROWTH 5 DAYS Performed at [facility], [address], [place], [place] [address]    Report Status 01/27/2023 FINAL  Final  Blood Cultures x 2 sites     Status: None   Collection Time: 01/22/23  1:00 PM   Specimen: BLOOD  Result Value Ref Range Status   Specimen Description BLOOD SITE NOT SPECIFIED  Final   Special Requests   Final    BOTTLES DRAWN AEROBIC AND ANAEROBIC Blood Culture results may not be optimal due to an inadequate volume of blood received in culture bottles   Culture   Final    NO GROWTH 5 DAYS Performed at [facility], [address] [place], [place] [address]    Report Status 01/27/2023 FINAL  Final  Surgical pcr screen     Status: Abnormal   Collection Time: 01/22/23  2:12 PM   Specimen: Nasal Mucosa; Nasal Swab  Result Value Ref Range Status   MRSA, PCR POSITIVE (A) NEGATIVE Final    Comment: RESULT CALLED TO, READ BACK BY AND VERIFIED WITH: RN FREDRIK SHEETS [id number] @[phone]  FH    Staphylococcus aureus POSITIVE (A) NEGATIVE Final    Comment: (NOTE) The Xpert SA Assay (FDA approved for NASAL specimens in patients 26 years of age and older), is one component of a comprehensive surveillance program. It is not intended to diagnose infection nor to guide or monitor treatment. Performed at [facility], [address] [place],  [place] [address]   Aerobic/Anaerobic Culture w Gram Stain (surgical/deep wound)     Status: None (Preliminary result)   Collection Time: 01/22/23  3:51 PM   Specimen: Wound  Result Value Ref Range Status   Specimen Description WOUND  Final   Special Requests ABOVE LEFT KNEE AMPUTATION  Final   Gram Stain   Final    ABUNDANT WBC PRESENT, PREDOMINANTLY PMN RARE GRAM POSITIVE COCCI IN PAIRS    Culture   Final    ABUNDANT METHICILLIN RESISTANT STAPHYLOCOCCUS AUREUS NO ANAEROBES ISOLATED Sent to [facility] for further susceptibility testing. Performed at [facility], [address], [place], [place] [address]    Report Status PENDING  Incomplete   Organism ID, Bacteria METHICILLIN RESISTANT STAPHYLOCOCCUS AUREUS  Final      Susceptibility   Methicillin resistant staphylococcus aureus - MIC*    CIPROFLOXACIN >=8 RESISTANT Resistant     ERYTHROMYCIN >=8 RESISTANT Resistant     GENTAMICIN <=0.5 SENSITIVE Sensitive     OXACILLIN >=4 RESISTANT Resistant     TETRACYCLINE >=16 RESISTANT Resistant     VANCOMYCIN  <=0.5 SENSITIVE Sensitive     TRIMETH/SULFA <=10 SENSITIVE Sensitive     CLINDAMYCIN RESISTANT Resistant     RIFAMPIN <=0.5 SENSITIVE Sensitive     Inducible Clindamycin POSITIVE Resistant     LINEZOLID 4 SENSITIVE Sensitive     * ABUNDANT METHICILLIN RESISTANT STAPHYLOCOCCUS AUREUS  MIC (1 Drug)-Wound culture; 01/22/2023; Wound; MRSA / Staph aureus; Daptomycin ; Patient immune status: Normal     Status: Abnormal   Collection Time: 01/28/23  9:06 AM   Specimen: Wound  Result Value Ref Range Status   Min Inhibitory Conc (1 Drug) Final report (A)  Corrected    Comment: (NOTE) Performed At: [facility] [address] [place], [place] [id number] Jennette Shorter MD Ey:[id number] CORRECTED ON 01/06 AT 0735: PREVIOUSLY REPORTED AS Preliminary report    Source WOUND  Final    Comment: LEFT KNEE AMPUTATION Performed at [facility], [address] [place], [place]  [address]      Medications:    aspirin  EC  81 mg Oral Daily   atorvastatin   80 mg Oral QHS   clopidogrel   75 mg Oral Daily   enoxaparin  (LOVENOX ) injection  40 mg Subcutaneous Q24H   fentaNYL        fentaNYL        gabapentin   600 mg Oral TID   HYDROcodone -acetaminophen        insulin  aspart  0-15 Units Subcutaneous TID WC   insulin  aspart  0-5 Units Subcutaneous QHS   insulin  glargine-yfgn  10 Units Subcutaneous QHS   metoprolol  succinate  50 mg Oral Daily   senna-docusate  1 tablet Oral Daily   Continuous Infusions:  DAPTOmycin  700 mg (01/31/23 1315)      LOS: 10 days   Erle Odell Castor  [facility]  02/01/2023, 11:44 AM

## 2023-02-01 NOTE — Progress Notes (Signed)
 Daughter Kara Mead was called and updated regarding the status of patient.

## 2023-02-01 NOTE — Anesthesia Preprocedure Evaluation (Addendum)
 Anesthesia Evaluation  Patient identified by MRN, date of birth, ID band Patient awake    Reviewed: Allergy & Precautions, NPO status , Patient's Chart, lab work & pertinent test results, reviewed documented beta blocker date and time   History of Anesthesia Complications Negative for: history of anesthetic complications  Airway Mallampati: II  TM Distance: >3 FB     Dental  (+) Edentulous Upper, Edentulous Lower   Pulmonary neg COPD, former smoker   breath sounds clear to auscultation       Cardiovascular hypertension, + Peripheral Vascular Disease and +CHF  (-) Past MI, (-) Cardiac Stents and (-) CABG  Rhythm:Regular Rate:Normal  LVEF ~35%, RV mildly reduced   Neuro/Psych neg Seizures PSYCHIATRIC DISORDERS  Depression     Neuromuscular disease CVA, No Residual Symptoms    GI/Hepatic ,neg GERD  ,,(+) neg Cirrhosis        Endo/Other  diabetes, Type 2    Renal/GU CRFRenal disease     Musculoskeletal  (+) Arthritis ,    Abdominal   Peds  Hematology  (+) Blood dyscrasia, anemia   Anesthesia Other Findings   Reproductive/Obstetrics                              Anesthesia Physical Anesthesia Plan  ASA: 3  Anesthesia Plan: General   Post-op Pain Management:    Induction: Intravenous  PONV Risk Score and Plan: 2 and Ondansetron  Airway Management Planned: Oral ETT  Additional Equipment:   Intra-op Plan:   Post-operative Plan: Extubation in OR  Informed Consent: I have reviewed the patients History and Physical, chart, labs and discussed the procedure including the risks, benefits and alternatives for the proposed anesthesia with the patient or authorized representative who has indicated his/her understanding and acceptance.     Dental advisory given  Plan Discussed with: CRNA  Anesthesia Plan Comments:          Anesthesia Quick Evaluation

## 2023-02-01 NOTE — Op Note (Signed)
    NAME: James Dougherty    MRN: [id number] DOB: 11/30/1950    DATE OF OPERATION: 02/01/2023  PREOP DIAGNOSIS:    Open left above-knee amputation  POSTOP DIAGNOSIS:    Same  PROCEDURE:    Left above-knee amputation revision 2000 mg myriad morcel placement Vacuum-assisted dressing  SURGEON: Fonda FORBES Rim  ASSIST: Sherrilee Holster, PA  ANESTHESIA: General  EBL: 25 mL  INDICATIONS:    [name] is a 73 y.o. male with multiple medical comorbidities status post left above-knee amputation roughly 6 weeks ago.  He presented to the ED several days ago with above-knee amputation wound infection.  He has been taken back to the OR twice for wound washout.  After discussing the risks and benefits of left AKA washout, possible revision today, [name] elected to proceed.  FINDINGS:   4 cm exposed femur in the wound bed, resulting in 6cm femur resection to healthy bone Debridement of skin, adipose tissue, muscle Partial closure Healthy muscle appreciated.  TECHNIQUE:   Patient is brought to the OR laid in supine position.  General anesthesia was induced and the patient was prepped draped standard fashion.  The case began with assessment of wound bed.  On this assessment, 4 cm of the femur was appreciated.  There was also devitalized soft tissue including skin, adipose tissue, and muscle.  Sharp dissection was used to resect all of the devitalized tissue.  Next, healthy femur was exposed proximal to the portion that was in the wound bed.  A bone saw was brought onto the field and 8 cm of femoral resected.  Wound was irrigated with copious amounts of saline.  Soft tissue was closed over the femur using 2-0 Vicryl suture.  In total, two layers were closed.  Next, a blue VAC sponge was brought onto the field and placed in the wound bed.  The total wound size was 20 cm x 6 cm x 4 cm.  The [place] machine was started.  No leak was appreciated.  The patient was taken back in stable  condition.  Fonda FORBES Rim, MD [facility] DATE OF DICTATION:   02/01/2023

## 2023-02-01 NOTE — Anesthesia Procedure Notes (Signed)
 Procedure Name: Intubation Date/Time: 02/01/2023 8:42 AM  Performed by: Myrna Homer, CRNAPre-anesthesia Checklist: Patient identified, Emergency Drugs available, Suction available and Patient being monitored Patient Re-evaluated:Patient Re-evaluated prior to induction Oxygen Delivery Method: Circle System Utilized Preoxygenation: Pre-oxygenation with 100% oxygen Induction Type: IV induction Ventilation: Mask ventilation without difficulty Laryngoscope Size: Mac and 4 Grade View: Grade I Tube type: Oral Tube size: 7.5 mm Number of attempts: 1 Airway Equipment and Method: Stylet and Oral airway Placement Confirmation: ETT inserted through vocal cords under direct vision, positive ETCO2 and breath sounds checked- equal and bilateral Secured at: 23 cm Tube secured with: Tape Dental Injury: Teeth and Oropharynx as per pre-operative assessment

## 2023-02-01 NOTE — Progress Notes (Signed)
 Upon assessment patient was found with gross bleeding to left lower extremity with wound vac alarming blockage. Linens saturated. Dr. Sherral Hammers page and arrived to the bedside shortly.

## 2023-02-02 ENCOUNTER — Encounter (HOSPITAL_COMMUNITY): Payer: Self-pay | Admitting: Vascular Surgery

## 2023-02-02 DIAGNOSIS — N1832 Chronic kidney disease, stage 3b: Secondary | ICD-10-CM | POA: Diagnosis not present

## 2023-02-02 DIAGNOSIS — T8740 Infection of amputation stump, unspecified extremity: Secondary | ICD-10-CM | POA: Diagnosis not present

## 2023-02-02 DIAGNOSIS — A419 Sepsis, unspecified organism: Secondary | ICD-10-CM | POA: Diagnosis not present

## 2023-02-02 DIAGNOSIS — T8144XA Sepsis following a procedure, initial encounter: Secondary | ICD-10-CM | POA: Diagnosis not present

## 2023-02-02 LAB — BASIC METABOLIC PANEL
Anion gap: 9 (ref 5–15)
BUN: 10 mg/dL (ref 8–23)
CO2: 25 mmol/L (ref 22–32)
Calcium: 8.3 mg/dL — ABNORMAL LOW (ref 8.9–10.3)
Chloride: 102 mmol/L (ref 98–111)
Creatinine, Ser: 1 mg/dL (ref 0.61–1.24)
GFR, Estimated: >60 mL/min (ref >60)
Glucose, Bld: 202 mg/dL — ABNORMAL HIGH (ref 70–99)
Potassium: 5.1 mmol/L (ref 3.5–5.1)
Sodium: 136 mmol/L (ref 135–145)

## 2023-02-02 LAB — GLUCOSE, CAPILLARY
Glucose-Capillary: 122 mg/dL — ABNORMAL HIGH (ref 70–99)
Glucose-Capillary: 101 mg/dL — ABNORMAL HIGH (ref 70–99)
Glucose-Capillary: 122 mg/dL — ABNORMAL HIGH (ref 70–99)
Glucose-Capillary: 120 mg/dL — ABNORMAL HIGH (ref 70–99)

## 2023-02-02 LAB — CBC
HCT: 35.2 % — ABNORMAL LOW (ref 39.0–52.0)
Hemoglobin: 11.1 g/dL — ABNORMAL LOW (ref 13.0–17.0)
MCH: 31.4 pg (ref 26.0–34.0)
MCHC: 31.5 g/dL (ref 30.0–36.0)
MCV: 99.7 fL (ref 80.0–100.0)
Platelets: 336 10*3/uL (ref 150–400)
RBC: 3.53 MIL/uL — ABNORMAL LOW (ref 4.22–5.81)
RDW: 15.2 % (ref 11.5–15.5)
WBC: 12.4 10*3/uL — ABNORMAL HIGH (ref 4.0–10.5)
nRBC: 0 % (ref 0.0–0.2)

## 2023-02-02 MED ORDER — MICONAZOLE NITRATE POWD
Freq: Two times a day (BID) | Status: DC
  Administered 2023-02-03: 1 via TOPICAL

## 2023-02-02 MED ORDER — FLUCONAZOLE 150 MG PO TABS
150.0000 mg | ORAL_TABLET | ORAL | Status: AC
Start: 2023-02-02 — End: 2023-02-05
  Administered 2023-02-02 – 2023-02-05 (×2): 150 mg via ORAL
  Filled 2023-02-02 (×2): qty 1

## 2023-02-02 MED ORDER — ENOXAPARIN SODIUM 40 MG/0.4ML IJ SOSY
40.0000 mg | PREFILLED_SYRINGE | INTRAMUSCULAR | Status: DC
  Administered 2023-02-02 – 2023-02-12 (×11): 40 mg via SUBCUTANEOUS
  Filled 2023-02-02 (×11): qty 0.4

## 2023-02-02 NOTE — Progress Notes (Addendum)
  Progress Note    02/02/2023 6:51 AM 1 Day Post-Op  Subjective:  says he had some pain last night in his leg and had pain pills.  He says his butt has sores and it hurts.  afebrile Vitals:   02/02/23 0237 02/02/23 0500  BP: 121/68 (!) 114/45  Pulse: 69 77  Resp: 18 16  Temp: 97.8 F (36.6 C) 98.4 F (36.9 C)  SpO2: 98% 99%    Physical Exam: Incisions:  left AKA dressing in tact Extremities:  right foot is stable  CBC    Component Value Date/Time   WBC 7.8 02/01/2023 0459   RBC 3.51 (L) 02/01/2023 0459   HGB 11.0 (L) 02/01/2023 0459   HCT 35.0 (L) 02/01/2023 0459   PLT 309 02/01/2023 0459   MCV 99.7 02/01/2023 0459   MCH 31.3 02/01/2023 0459   MCHC 31.4 02/01/2023 0459   RDW 15.7 (H) 02/01/2023 0459   LYMPHSABS 2.4 02/01/2023 0459   MONOABS 0.7 02/01/2023 0459   EOSABS 0.7 (H) 02/01/2023 0459   BASOSABS 0.1 02/01/2023 0459    BMET    Component Value Date/Time   NA 139 02/01/2023 0459   K 4.5 02/01/2023 0459   CL 108 02/01/2023 0459   CO2 23 02/01/2023 0459   GLUCOSE 93 02/01/2023 0459   BUN 11 02/01/2023 0459   CREATININE 0.99 02/01/2023 0459   CALCIUM  8.0 (L) 02/01/2023 0459   GFRNONAA >60 02/01/2023 0459   GFRAA 47 (L) 04/28/2017 0238    INR    Component Value Date/Time   INR 1.2 12/11/2022 1105     Intake/Output Summary (Last 24 hours) at 02/02/2023 0651 Last data filed at 02/02/2023 0600 Gross per 24 hour  Intake 610 ml  Output 520 ml  Net 90 ml     Assessment/Plan:  73 y.o. male is s/p I&D left AKA and wound vac 01/22/2023 and simple partial closure of left AKA and wound vac placement 01/25/2023 both by Dr. Magda and left AKA revision with myriad placement 02/01/2023 by Dr. Lanis  -left AKA dressing in tact.  Most likely will remove tomorrow -4cm exposed femur in wound bed and resulted in 6cm resection to healthy bone.  -right foot is stable currently and Dr. Lanis plans to address it when he is getting over the left AKA -abx per ID x 6  weeks  Lucie Apt, PA-C [facility] [phone] 02/02/2023 6:51 AM   VASCULAR STAFF ADDENDUM: I have independently interviewed and examined the patient. I agree with the above.  Pt with less stump pain than yesterday. Dressing changed. Wound bed healthy. Severe fungal rash in the groin. Will order diflucan  and powder.  Pt with condom cath, if leaking around it, may need catheter.  Pt with sacral dressing, complaining of bottom pain. Unable to turn him to the see if there is a wound, will discuss with nursing, if wound present will have WOCN make recommendations.  Will be transferring to 4E - primary vascular floor for wound management and s/p aka with multiple washouts.     Fonda FORBES Lanis MD [facility] Phone Number: [phone] 02/02/2023 9:54 AM

## 2023-02-02 NOTE — Progress Notes (Addendum)
 [facility] PROGRESS NOTE    Progress Note  James Dougherty  FMW:[id number] DOB: [birth year] DOA: 01/22/2023 PCP: Atilano Deward ORN, MD     Brief Narrative:   James Dougherty is an 73 y.o. male past medical history significant for obesity, diabetes mellitus type 2, essential hypertension stroke critical right ICA stenosis, chronic kidney disease, lymphedema recently discharged from the hospital on 12/18/2022 for nonhealing left lower extremity wound, underwent left AKA and subsequently discharged to rehab on 01/18/2023 was seen by vascular surgery at the office noted that the left BKA was draining and the lateral aspect of the stump was concerning for infection, but I see remain afebrile with no leukocytosis and asymptomatic it was recommended to follow closely.  On 01/22/2023 was sent from skilled due to draining of the stump and low-grade fever.  Started empirically on IV antibiotics on admission   Assessment/Plan:  Amputation stump infection (HCC) Status post I&D of AKA and wound VAC on 01/22/2023 and simple partial closure of left AKA wound VAC placement on 01/25/2023. Intraoperative cultures on 01/22/2023 grew MRSA. ID was consulted recommended IV daptomycin . Vascular surgery recommended left above-knee amputation revision and wound VAC exchange on 02/01/2023. PT evaluated the patient, will need skilled nursing facility placement. Lovenox  held due to bleeding from stump.  Wound VAC has been discontinued. Further management per vascular surgery. Orthopedic surgery to dictate when to re-start aspirin , Plavix . Restart Lovenox  for DVT prophylaxis.  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 Aspirin  and Plavix  were held due to bleeding from stump, continue statins. He will need angiogram of the left AKA as an outpatient on 02/10/2023.  Chronic combined systolic and diastolic heart failure/essential hypertension: Currently on metoprolol . Lasix   and Jardiance  are on hold for surgical procedure.  Diabetes mellitus type 2 uncontrolled with peripheral neuropathy: With an A1c of 8.4. Continue long-acting insulin  plus sliding scale. Continue Cymbalta  and Neurontin .  History of CVA/right ICA stenosis/hyperlipidemia: Continue statins.  Hold aspirin  and Plavix  as well as Lovenox .  Chronic kidney disease stage IIIb: His creatinine is at baseline.  Acute blood loss anemia: Lovenox  was held along with aspirin  and Plavix . Hemoglobin has remained stable this morning continue to monitor intermittently.  Obesity: Counseling.  Constipation: Had multiple bowel movements he feels better. Continue MiraLAX  twice a day.  Coccygeal pressure ulcer unstageable present on admission: RN Pressure Injury Documentation: Pressure Injury 11/04/21 Coccyx Mid Deep Tissue Pressure Injury - Purple or maroon localized area of discolored intact skin or blood-filled blister due to damage of underlying soft tissue from pressure and/or shear. non-blanchable purple area surrounded  (Active)  11/04/21 1634  Location: Coccyx  Location Orientation: Mid  Staging: Deep Tissue Pressure Injury - Purple or maroon localized area of discolored intact skin or blood-filled blister due to damage of underlying soft tissue from pressure and/or shear.  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 (Active)  12/11/22 1915  Location: Coccyx  Location Orientation:   Staging: Stage 2 -  Partial thickness loss of dermis presenting as a shallow open injury with a red, pink wound bed without slough.  Wound Description (Comments):   Present on Admission: Yes  Dressing Type None 02/01/23 1313    Estimated body mass index is 30.03 kg/m as calculated from  the following:   Height as of this encounter: 5' 8 (1.727 m).   Weight as of this encounter: 89.6 kg.   DVT prophylaxis: None Family Communication:none Status is: Inpatient Remains inpatient appropriate because: Stump infection    Code Status:     Code Status Orders  (From admission, onward)           Start     Ordered   01/22/23 1639  Full code  Continuous       Question:  By:  Answer:  Consent: discussion documented in EHR   01/22/23 1640           Code Status History     Date Active Date Inactive Code Status Order ID Comments User Context   12/11/2022 1522 12/18/2022 1906 Full Code [id number]  Vernon Ranks, MD Inpatient   09/30/2022 1153 09/30/2022 1943 Full Code [id number]  Lanis Fonda BRAVO, MD Inpatient   11/04/2021 1600 11/14/2021 1944 Full Code [id number]  Maurice Sharlet RAMAN, PA-C Inpatient   10/28/2021 1953 11/04/2021 1545 Full Code [id number]  Sebastian Moles, MD ED   04/19/2018 1017 04/19/2018 1744 Full Code [id number]  Serene Gaile ORN, MD Inpatient   04/26/2017 1848 04/28/2017 1826 Full Code [id number]  Bethanie Donnice RIGGERS Inpatient   04/01/2017 2231 04/06/2017 2009 Full Code [id number]  Luke Agent, MD Inpatient      Advance Directive Documentation    Flowsheet Row Most Recent Value  Type of Advance Directive Healthcare Power of Attorney  Pre-existing out of facility DNR order (yellow form or pink MOST form) --  MOST Form in Place? --         IV Access:   Peripheral IV   Procedures and diagnostic studies:   No results found.   Medical Consultants:   None.   Subjective:    James Dougherty he is in pain this morning has not had a bowel movement.  Objective:    Vitals:   02/01/23 2201 02/02/23 0237 02/02/23 0500 02/02/23 0728  BP: (!) 131/50 121/68 (!) 114/45 115/86  Pulse: 63 69 77 72  Resp: 17 18 16 20   Temp: 98.3 F (36.8 C) 97.8 F (36.6 C) 98.4 F (36.9 C) 98 F (36.7 C)  TempSrc: Oral Oral Oral Oral  SpO2: 99% 98% 99% 94%   Weight:      Height:       SpO2: 94 % O2 Flow Rate (L/min): 0 L/min   Intake/Output Summary (Last 24 hours) at 02/02/2023 0904 Last data filed at 02/02/2023 0600 Gross per 24 hour  Intake 600 ml  Output 520 ml  Net 80 ml   Filed Weights   01/22/23 1410 01/25/23 1003 01/29/23 0507  Weight: 91.6 kg 89.8 kg 89.6 kg    Exam: General exam: In no acute distress. Respiratory system: Good air movement and clear to auscultation. Cardiovascular system: S1 & S2 heard, RRR. No JVD. Gastrointestinal system: Abdomen is nondistended, soft and nontender.  Extremities: Left knee above amputation with dressing Skin: No rashes, lesions or ulcers Psychiatry: Judgement and insight appear normal. Mood & affect appropriate.  Data Reviewed:    Labs: Basic Metabolic Panel: Recent Labs  Lab 01/29/23 0710 02/01/23 0459  NA 139 139  K 5.0 4.5  CL 107 108  CO2 21* 23  GLUCOSE 118* 93  BUN 10 11  CREATININE 1.01 0.99  CALCIUM  8.3* 8.0*  MG 2.2  --    GFR Estimated Creatinine Clearance: 73.4 mL/min (by C-G formula based on SCr of  0.99 mg/dL). Liver Function Tests: Recent Labs  Lab 01/29/23 0710  AST 35  ALT 48*  ALKPHOS 78  BILITOT 0.9  PROT 5.7*  ALBUMIN  2.4*   No results for input(s): LIPASE, AMYLASE in the last 168 hours. No results for input(s): AMMONIA in the last 168 hours. Coagulation profile No results for input(s): INR, PROTIME in the last 168 hours. COVID-19 Labs  No results for input(s): DDIMER, FERRITIN, LDH, CRP in the last 72 hours.  Lab Results  Component Value Date   SARSCOV2NAA NEGATIVE 10/28/2021    CBC: Recent Labs  Lab 01/29/23 0710 02/01/23 0459  WBC 10.2 7.8  NEUTROABS  --  3.9  HGB 14.1 11.0*  HCT 44.1 35.0*  MCV 97.8 99.7  PLT 334 309   Cardiac Enzymes: Recent Labs  Lab 01/28/23 1135  CKTOTAL 30*   BNP (last 3 results) No results for input(s): PROBNP in the last 8760 hours. CBG: Recent Labs  Lab 02/01/23 0949  02/01/23 1111 02/01/23 1609 02/01/23 2017 02/02/23 0613  GLUCAP 99 89 108* 169* 122*   D-Dimer: No results for input(s): DDIMER in the last 72 hours. Hgb A1c: No results for input(s): HGBA1C in the last 72 hours. Lipid Profile: No results for input(s): CHOL, HDL, LDLCALC, TRIG, CHOLHDL, LDLDIRECT in the last 72 hours. Thyroid  function studies: No results for input(s): TSH, T4TOTAL, T3FREE, THYROIDAB in the last 72 hours.  Invalid input(s): FREET3 Anemia work up: No results for input(s): VITAMINB12, FOLATE, FERRITIN, TIBC, IRON, RETICCTPCT in the last 72 hours. Sepsis Labs: Recent Labs  Lab 01/29/23 0710 02/01/23 0459  WBC 10.2 7.8   Microbiology Recent Results (from the past 240 hours)  MIC (1 Drug)-Wound culture; 01/22/2023; Wound; MRSA / Staph aureus; Daptomycin ; Patient immune status: Normal     Status: Abnormal   Collection Time: 01/28/23  9:06 AM   Specimen: Wound  Result Value Ref Range Status   Min Inhibitory Conc (1 Drug) Final report (A)  Corrected    Comment: (NOTE) Performed At: [facility] [address] [place], [place] [id number] Jennette Shorter MD Ey:[id number] CORRECTED ON 01/06 AT 0735: PREVIOUSLY REPORTED AS Preliminary report    Source WOUND  Final    Comment: LEFT KNEE AMPUTATION Performed at [facility], [address] [place],  [address]      Medications:    atorvastatin   80 mg Oral QHS   gabapentin   600 mg Oral TID   insulin  aspart  0-15 Units Subcutaneous TID WC   insulin  aspart  0-5 Units Subcutaneous QHS   insulin  glargine-yfgn  10 Units Subcutaneous QHS   metoprolol  succinate  50 mg Oral Daily   polyethylene glycol  17 g Oral BID   senna-docusate  1 tablet Oral Daily   Continuous Infusions:  DAPTOmycin  700 mg (02/01/23 1517)      LOS: 11 days   Erle Odell Castor  [facility]  02/02/2023, 9:04 AM

## 2023-02-02 NOTE — Progress Notes (Signed)
 [facility] PROGRESS NOTE    Progress Note  James Dougherty  FMW:[id number] DOB: 29-Oct-1950 DOA: 01/22/2023 PCP: Atilano Deward ORN, MD     Brief Narrative:   James Dougherty is an 73 y.o. male past medical history significant for obesity, diabetes mellitus type 2, essential hypertension stroke critical right ICA stenosis, chronic kidney disease, lymphedema recently discharged from the hospital on 12/18/2022 for nonhealing left lower extremity wound, underwent left AKA and subsequently discharged to rehab on 01/18/2023 was seen by vascular surgery at the office noted that the left BKA was draining and the lateral aspect of the stump was concerning for infection, but I see remain afebrile with no leukocytosis and asymptomatic it was recommended to follow closely.  On 01/22/2023 was sent from skilled due to draining of the stump and low-grade fever.  Started empirically on IV antibiotics on admission   Assessment/Plan:  Amputation stump infection (HCC) Status post I&D of AKA and wound VAC on 01/22/2023 and simple partial closure of left AKA wound VAC placement on 01/25/2023. Intraoperative cultures on 01/22/2023 grew MRSA. ID was consulted recommended IV daptomycin . Vascular surgery recommended left above-knee amputation revision and wound VAC exchange on 02/01/2023. PT evaluated the patient, will need skilled nursing facility placement. Lovenox  held due to bleeding from stump.  Wound VAC has been discontinued. Further management per vascular surgery. Orthopedic surgery to dictate when to re-start aspirin , Plavix . Restart Lovenox  for DVT prophylaxis.  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 Aspirin  and Plavix  were held due to bleeding from stump, continue statins. He will need angiogram of the left AKA as an outpatient on 02/10/2023.  Chronic combined systolic and diastolic heart failure/essential hypertension: Currently on metoprolol . Lasix   and Jardiance  are on hold for surgical procedure.  Diabetes mellitus type 2 uncontrolled with peripheral neuropathy: With an A1c of 8.4. Continue long-acting insulin  plus sliding scale. Continue Cymbalta  and Neurontin .  History of CVA/right ICA stenosis/hyperlipidemia: Continue statins.  Hold aspirin  and Plavix  as well as Lovenox .  Chronic kidney disease stage IIIb: His creatinine is at baseline.  Acute blood loss anemia: Lovenox  was held along with aspirin  and Plavix . Hemoglobin has remained stable this morning continue to monitor intermittently.  Obesity: Counseling.  Tinea Cruris: Cont miconazole  powder.  Constipation: Had multiple bowel movements he feels better. Continue MiraLAX  twice a day.  Coccygeal pressure ulcer unstageable present on admission: RN Pressure Injury Documentation: Pressure Injury 11/04/21 Coccyx Mid Deep Tissue Pressure Injury - Purple or maroon localized area of discolored intact skin or blood-filled blister due to damage of underlying soft tissue from pressure and/or shear. non-blanchable purple area surrounded  (Active)  11/04/21 1634  Location: Coccyx  Location Orientation: Mid  Staging: Deep Tissue Pressure Injury - Purple or maroon localized area of discolored intact skin or blood-filled blister due to damage of underlying soft tissue from pressure and/or shear.  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 (Active)  12/11/22 1915  Location: Coccyx  Location Orientation:   Staging: Stage 2 -  Partial thickness loss of dermis presenting as a shallow open injury with a red, pink wound bed without slough.  Wound Description (Comments):   Present on Admission: Yes  Dressing Type None 02/01/23 1313    Estimated body mass  index is 30.03 kg/m as calculated from the following:   Height as of this encounter: 5' 8 (1.727 m).   Weight as of this encounter: 89.6 kg.   DVT prophylaxis: None Family Communication:none Status is: Inpatient Remains inpatient appropriate because: Stump infection    Code Status:     Code Status Orders  (From admission, onward)           Start     Ordered   01/22/23 1639  Full code  Continuous       Question:  By:  Answer:  Consent: discussion documented in EHR   01/22/23 1640           Code Status History     Date Active Date Inactive Code Status Order ID Comments User Context   12/11/2022 1522 12/18/2022 1906 Full Code [id number]  Vernon Ranks, MD Inpatient   09/30/2022 1153 09/30/2022 1943 Full Code [id number]  Lanis Fonda BRAVO, MD Inpatient   11/04/2021 1600 11/14/2021 1944 Full Code [id number]  Maurice Sharlet RAMAN, PA-C Inpatient   10/28/2021 1953 11/04/2021 1545 Full Code [id number]  Sebastian Moles, MD ED   04/19/2018 1017 04/19/2018 1744 Full Code [id number]  Serene Gaile ORN, MD Inpatient   04/26/2017 1848 04/28/2017 1826 Full Code [id number]  Bethanie Donnice RIGGERS Inpatient   04/01/2017 2231 04/06/2017 2009 Full Code [id number]  Luke Agent, MD Inpatient      Advance Directive Documentation    Flowsheet Row Most Recent Value  Type of Advance Directive Healthcare Power of Attorney  Pre-existing out of facility DNR order (yellow form or pink MOST form) --  MOST Form in Place? --         IV Access:   Peripheral IV   Procedures and diagnostic studies:   No results found.   Medical Consultants:   None.   Subjective:    James Dougherty he is in pain this morning has not had a bowel movement.  Objective:    Vitals:   02/01/23 2201 02/02/23 0237 02/02/23 0500 02/02/23 0728  BP: (!) 131/50 121/68 (!) 114/45 115/86  Pulse: 63 69 77 72  Resp: 17 18 16 20   Temp: 98.3 F (36.8 C) 97.8 F (36.6 C) 98.4 F (36.9 C) 98 F (36.7 C)  TempSrc: Oral Oral  Oral Oral  SpO2: 99% 98% 99% 94%  Weight:      Height:       SpO2: 94 % O2 Flow Rate (L/min): 0 L/min   Intake/Output Summary (Last 24 hours) at 02/02/2023 1157 Last data filed at 02/02/2023 0600 Gross per 24 hour  Intake 300 ml  Output 400 ml  Net -100 ml   Filed Weights   01/22/23 1410 01/25/23 1003 01/29/23 0507  Weight: 91.6 kg 89.8 kg 89.6 kg    Exam: General exam: In no acute distress. Respiratory system: Good air movement and clear to auscultation. Cardiovascular system: S1 & S2 heard, RRR. No JVD. Gastrointestinal system: Abdomen is nondistended, soft and nontender.  Extremities: Left knee above amputation with dressing Skin: No rashes, lesions or ulcers Psychiatry: Judgement and insight appear normal. Mood & affect appropriate.  Data Reviewed:    Labs: Basic Metabolic Panel: Recent Labs  Lab 01/29/23 0710 02/01/23 0459 02/02/23 0827  NA 139 139 136  K 5.0 4.5 5.1  CL 107 108 102  CO2 21* 23 25  GLUCOSE 118* 93 202*  BUN 10 11 10   CREATININE 1.01 0.99 1.00  CALCIUM  8.3* 8.0* 8.3*  MG 2.2  --   --  GFR Estimated Creatinine Clearance: 72.6 mL/min (by C-G formula based on SCr of 1 mg/dL). Liver Function Tests: Recent Labs  Lab 01/29/23 0710  AST 35  ALT 48*  ALKPHOS 78  BILITOT 0.9  PROT 5.7*  ALBUMIN  2.4*   No results for input(s): LIPASE, AMYLASE in the last 168 hours. No results for input(s): AMMONIA in the last 168 hours. Coagulation profile No results for input(s): INR, PROTIME in the last 168 hours. COVID-19 Labs  No results for input(s): DDIMER, FERRITIN, LDH, CRP in the last 72 hours.  Lab Results  Component Value Date   SARSCOV2NAA NEGATIVE 10/28/2021    CBC: Recent Labs  Lab 01/29/23 0710 02/01/23 0459 02/02/23 0827  WBC 10.2 7.8 12.4*  NEUTROABS  --  3.9  --   HGB 14.1 11.0* 11.1*  HCT 44.1 35.0* 35.2*  MCV 97.8 99.7 99.7  PLT 334 309 336   Cardiac Enzymes: Recent Labs  Lab 01/28/23 1135   CKTOTAL 30*   BNP (last 3 results) No results for input(s): PROBNP in the last 8760 hours. CBG: Recent Labs  Lab 02/01/23 1111 02/01/23 1609 02/01/23 2017 02/02/23 0613 02/02/23 1130  GLUCAP 89 108* 169* 122* 101*   D-Dimer: No results for input(s): DDIMER in the last 72 hours. Hgb A1c: No results for input(s): HGBA1C in the last 72 hours. Lipid Profile: No results for input(s): CHOL, HDL, LDLCALC, TRIG, CHOLHDL, LDLDIRECT in the last 72 hours. Thyroid  function studies: No results for input(s): TSH, T4TOTAL, T3FREE, THYROIDAB in the last 72 hours.  Invalid input(s): FREET3 Anemia work up: No results for input(s): VITAMINB12, FOLATE, FERRITIN, TIBC, IRON, RETICCTPCT in the last 72 hours. Sepsis Labs: Recent Labs  Lab 01/29/23 0710 02/01/23 0459 02/02/23 0827  WBC 10.2 7.8 12.4*   Microbiology Recent Results (from the past 240 hours)  MIC (1 Drug)-Wound culture; 01/22/2023; Wound; MRSA / Staph aureus; Daptomycin ; Patient immune status: Normal     Status: Abnormal   Collection Time: 01/28/23  9:06 AM   Specimen: Wound  Result Value Ref Range Status   Min Inhibitory Conc (1 Drug) Final report (A)  Corrected    Comment: (NOTE) Performed At: [facility] [address] [place], [place] [id number] Jennette Shorter MD Ey:[id number] CORRECTED ON 01/06 AT 0735: PREVIOUSLY REPORTED AS Preliminary report    Source WOUND  Final    Comment: LEFT KNEE AMPUTATION Performed at [facility], [address] [place], [place] [address]      Medications:    atorvastatin   80 mg Oral QHS   enoxaparin  (LOVENOX ) injection  40 mg Subcutaneous Q24H   fluconazole   150 mg Oral Q72H   gabapentin   600 mg Oral TID   insulin  aspart  0-15 Units Subcutaneous TID WC   insulin  aspart  0-5 Units Subcutaneous QHS   insulin  glargine-yfgn  10 Units Subcutaneous QHS   metoprolol  succinate  50 mg Oral Daily   miconazole  nitrate   Topical  BID   polyethylene glycol  17 g Oral BID   senna-docusate  1 tablet Oral Daily   Continuous Infusions:  DAPTOmycin  700 mg (02/01/23 1517)      LOS: 11 days   Erle Odell Castor  [facility]  02/02/2023, 11:57 AM

## 2023-02-02 NOTE — Progress Notes (Signed)
 Physical Therapy Treatment Patient Details Name: James Dougherty MRN: [id number] DOB: [birth year] Today's Date: 02/02/2023   History of Present Illness 73 y.o. male presents to [facility] 01/22/23 from [facility] rehab for post-op evaluation of recent L AKA (11/15) w/ concern for infection and severe sepsis. S/p I&D of L AKA and application of wound vac 12/27. s/p I&D and wound vac change 12/30. s/p wound washout and revision of AKA 1/6. Past medical history of arthritis, hyperlipidemia, hypertension, neuropathic pain, PAD, psoriasis, stroke, diabetes, CKD 3B, amputation R great toe 2019, R ICA stenosis    PT Comments  Session limited as pt transferring to 4E. Worked on BUE/BLE strengthening exercises with emphasis on breathing. SpO2 95% on RA. Pt performing bed mobility with maximal assist. Will benefit from further transfer training next session.    If plan is discharge home, recommend the following: A lot of help with walking and/or transfers;A lot of help with bathing/dressing/bathroom;Direct supervision/assist for medications management;Direct supervision/assist for financial management;Assist for transportation;Help with stairs or ramp for entrance   Can travel by private vehicle     No  Equipment Recommendations  Other (comment) (defer)    Recommendations for Other Services       Precautions / Restrictions Precautions Precautions: Fall Precaution Comments: L AKA Restrictions Weight Bearing Restrictions Per Provider Order: Yes LLE Weight Bearing Per Provider Order: Non weight bearing     Mobility  Bed Mobility Overal bed mobility: Needs Assistance Bed Mobility: Rolling Rolling: Max assist         General bed mobility comments: MaxA for rolling to left    Transfers                   General transfer comment: deferred as pt transferring to cardiac unit    Ambulation/Gait                   Stairs             Wheelchair Mobility     Tilt Bed     Modified Rankin (Stroke Patients Only)       Balance                                            Cognition Arousal: Alert Behavior During Therapy: WFL for tasks assessed/performed, Anxious Overall Cognitive Status: No family/caregiver present to determine baseline cognitive functioning                                 General Comments: Very HOH but able to follow commands when understanding        Exercises General Exercises - Upper Extremity Shoulder Flexion: AROM, Both, 10 reps, Supine General Exercises - Lower Extremity Short Arc Quad: Right, 10 reps, Supine, AROM Heel Slides: Right, 10 reps, Supine, AROM Hip ABduction/ADduction: Right, 10 reps, Supine, AAROM Straight Leg Raises: AROM, Right, Supine (3 reps) Amputee Exercises Hip Extension: Left, 5 reps, Supine Straight Leg Raises: Left, 5 reps, Supine (limited)    General Comments        Pertinent Vitals/Pain Pain Assessment Pain Assessment: Faces Faces Pain Scale: Hurts even more Pain Location: L residual limb Pain Descriptors / Indicators: Aching, Discomfort Pain Intervention(s): Limited activity within patient's tolerance, Monitored during session    Home Living  Prior Function            PT Goals (current goals can now be found in the care plan section) Acute Rehab PT Goals Patient Stated Goal: to get stronger Potential to Achieve Goals: Fair    Frequency    Min 1X/week      PT Plan      Co-evaluation              AM-PAC PT 6 Clicks Mobility   Outcome Measure  Help needed turning from your back to your side while in a flat bed without using bedrails?: A Lot Help needed moving from lying on your back to sitting on the side of a flat bed without using bedrails?: Total Help needed moving to and from a bed to a chair (including a wheelchair)?: Total Help needed standing up from a chair using your arms (e.g.,  wheelchair or bedside chair)?: Total Help needed to walk in hospital room?: Total Help needed climbing 3-5 steps with a railing? : Total 6 Click Score: 7    End of Session   Activity Tolerance: Patient tolerated treatment well Patient left: in bed;with call bell/phone within reach;with bed alarm set Nurse Communication: Mobility status PT Visit Diagnosis: Muscle weakness (generalized) (M62.81);Other abnormalities of gait and mobility (R26.89)     Time: 8654-8592 PT Time Calculation (min) (ACUTE ONLY): 22 min  Charges:    $Therapeutic Exercise: 8-22 mins PT General Charges $$ ACUTE PT VISIT: 1 Visit                     Aleck Dougherty, PT, DPT Acute Rehabilitation Services Office [phone]    James Dougherty 02/02/2023, 4:17 PM

## 2023-02-02 NOTE — Progress Notes (Signed)
 Lovenox  for vte ppx consult received  Plan:  Lovenox  40 mg [place] qday Monitor for signs of bleeding Pharmacy will sign off consult Thank you  Benedetta Heath BS, PharmD, BCPS Clinical Pharmacist 02/02/2023 11:20 AM  Contact: [phone] after 3 PM  Be curious, not judgmental... -Davina Sprinkles

## 2023-02-02 NOTE — Plan of Care (Signed)
  Problem: Health Behavior/Discharge Planning: Goal: Ability to manage health-related needs will improve Outcome: Not Progressing   Problem: Clinical Measurements: Goal: Ability to maintain clinical measurements within normal limits will improve Outcome: Progressing Goal: Will remain free from infection Outcome: Progressing Goal: Diagnostic test results will improve Outcome: Progressing   Problem: Clinical Measurements: Goal: Will remain free from infection Outcome: Progressing   Problem: Clinical Measurements: Goal: Diagnostic test results will improve Outcome: Progressing

## 2023-02-02 NOTE — Progress Notes (Signed)
 Pt. Transferred to 0Q65

## 2023-02-03 DIAGNOSIS — T8740 Infection of amputation stump, unspecified extremity: Secondary | ICD-10-CM | POA: Diagnosis not present

## 2023-02-03 LAB — GLUCOSE, CAPILLARY
Glucose-Capillary: 105 mg/dL — ABNORMAL HIGH (ref 70–99)
Glucose-Capillary: 122 mg/dL — ABNORMAL HIGH (ref 70–99)
Glucose-Capillary: 115 mg/dL — ABNORMAL HIGH (ref 70–99)
Glucose-Capillary: 159 mg/dL — ABNORMAL HIGH (ref 70–99)

## 2023-02-03 LAB — BASIC METABOLIC PANEL
Anion gap: 6 (ref 5–15)
BUN: 11 mg/dL (ref 8–23)
CO2: 24 mmol/L (ref 22–32)
Calcium: 7.9 mg/dL — ABNORMAL LOW (ref 8.9–10.3)
Chloride: 106 mmol/L (ref 98–111)
Creatinine, Ser: 1.11 mg/dL (ref 0.61–1.24)
GFR, Estimated: >60 mL/min (ref >60)
Glucose, Bld: 116 mg/dL — ABNORMAL HIGH (ref 70–99)
Potassium: 4.6 mmol/L (ref 3.5–5.1)
Sodium: 136 mmol/L (ref 135–145)

## 2023-02-03 MED ORDER — ACETAMINOPHEN 500 MG PO TABS
825.0000 mg | ORAL_TABLET | Freq: Three times a day (TID) | ORAL | Status: DC
  Administered 2023-02-03 – 2023-02-12 (×17): 825 mg via ORAL
  Filled 2023-02-03 (×23): qty 1

## 2023-02-03 MED ORDER — ACETAMINOPHEN 500 MG PO TABS: 1000.0000 mg | ORAL_TABLET | Freq: Three times a day (TID) | ORAL | Status: DC

## 2023-02-03 NOTE — Plan of Care (Signed)
  Problem: Education: Goal: Knowledge of General Education information will improve Description: Including pain rating scale, medication(s)/side effects and non-pharmacologic comfort measures Outcome: Progressing   Problem: Health Behavior/Discharge Planning: Goal: Ability to manage health-related needs will improve Outcome: Progressing   Problem: Clinical Measurements: Goal: Ability to maintain clinical measurements within normal limits will improve Outcome: Progressing Goal: Will remain free from infection Outcome: Progressing Goal: Diagnostic test results will improve Outcome: Progressing Goal: Respiratory complications will improve Outcome: Progressing Goal: Cardiovascular complication will be avoided Outcome: Progressing   Problem: Activity: Goal: Risk for activity intolerance will decrease Outcome: Progressing   Problem: Nutrition: Goal: Adequate nutrition will be maintained Outcome: Progressing   Problem: Coping: Goal: Level of anxiety will decrease Outcome: Progressing   Problem: Elimination: Goal: Will not experience complications related to bowel motility Outcome: Progressing Goal: Will not experience complications related to urinary retention Outcome: Progressing   Problem: Pain Management: Goal: General experience of comfort will improve Outcome: Progressing   Problem: Safety: Goal: Ability to remain free from injury will improve Outcome: Progressing   Problem: Skin Integrity: Goal: Risk for impaired skin integrity will decrease Outcome: Progressing   Problem: Fluid Volume: Goal: Hemodynamic stability will improve Outcome: Progressing   Problem: Clinical Measurements: Goal: Diagnostic test results will improve Outcome: Progressing Goal: Signs and symptoms of infection will decrease Outcome: Progressing   Problem: Respiratory: Goal: Ability to maintain adequate ventilation will improve Outcome: Progressing   Problem: Education: Goal:  Ability to describe self-care measures that may prevent or decrease complications (Diabetes Survival Skills Education) will improve Outcome: Progressing Goal: Individualized Educational Video(s) Outcome: Progressing   Problem: Coping: Goal: Ability to adjust to condition or change in health will improve Outcome: Progressing   Problem: Fluid Volume: Goal: Ability to maintain a balanced intake and output will improve Outcome: Progressing   Problem: Health Behavior/Discharge Planning: Goal: Ability to identify and utilize available resources and services will improve Outcome: Progressing Goal: Ability to manage health-related needs will improve Outcome: Progressing   Problem: Metabolic: Goal: Ability to maintain appropriate glucose levels will improve Outcome: Progressing   Problem: Nutritional: Goal: Maintenance of adequate nutrition will improve Outcome: Progressing Goal: Progress toward achieving an optimal weight will improve Outcome: Progressing   Problem: Skin Integrity: Goal: Risk for impaired skin integrity will decrease Outcome: Progressing   Problem: Tissue Perfusion: Goal: Adequacy of tissue perfusion will improve Outcome: Progressing

## 2023-02-03 NOTE — Plan of Care (Signed)
  Problem: Education: Goal: Knowledge of General Education information will improve Description: Including pain rating scale, medication(s)/side effects and non-pharmacologic comfort measures Outcome: Not Progressing   Problem: Health Behavior/Discharge Planning: Goal: Ability to manage health-related needs will improve Outcome: Not Progressing   Problem: Clinical Measurements: Goal: Ability to maintain clinical measurements within normal limits will improve Outcome: Not Progressing Goal: Will remain free from infection Outcome: Not Progressing Goal: Diagnostic test results will improve Outcome: Not Progressing Goal: Respiratory complications will improve Outcome: Not Progressing Goal: Cardiovascular complication will be avoided Outcome: Not Progressing   Problem: Activity: Goal: Risk for activity intolerance will decrease Outcome: Not Progressing   Problem: Nutrition: Goal: Adequate nutrition will be maintained Outcome: Not Progressing   Problem: Coping: Goal: Level of anxiety will decrease Outcome: Not Progressing   Problem: Elimination: Goal: Will not experience complications related to bowel motility Outcome: Not Progressing Goal: Will not experience complications related to urinary retention Outcome: Not Progressing   Problem: Pain Management: Goal: General experience of comfort will improve Outcome: Not Progressing   Problem: Safety: Goal: Ability to remain free from injury will improve Outcome: Not Progressing   Problem: Skin Integrity: Goal: Risk for impaired skin integrity will decrease Outcome: Not Progressing   Problem: Fluid Volume: Goal: Hemodynamic stability will improve Outcome: Not Progressing   Problem: Clinical Measurements: Goal: Diagnostic test results will improve Outcome: Not Progressing Goal: Signs and symptoms of infection will decrease Outcome: Not Progressing   Problem: Respiratory: Goal: Ability to maintain adequate ventilation  will improve Outcome: Not Progressing   Problem: Education: Goal: Ability to describe self-care measures that may prevent or decrease complications (Diabetes Survival Skills Education) will improve Outcome: Not Progressing Goal: Individualized Educational Video(s) Outcome: Not Progressing   Problem: Coping: Goal: Ability to adjust to condition or change in health will improve Outcome: Not Progressing   Problem: Fluid Volume: Goal: Ability to maintain a balanced intake and output will improve Outcome: Not Progressing   Problem: Health Behavior/Discharge Planning: Goal: Ability to identify and utilize available resources and services will improve Outcome: Not Progressing Goal: Ability to manage health-related needs will improve Outcome: Not Progressing   Problem: Metabolic: Goal: Ability to maintain appropriate glucose levels will improve Outcome: Not Progressing   Problem: Nutritional: Goal: Maintenance of adequate nutrition will improve Outcome: Not Progressing Goal: Progress toward achieving an optimal weight will improve Outcome: Not Progressing   Problem: Skin Integrity: Goal: Risk for impaired skin integrity will decrease Outcome: Not Progressing   Problem: Tissue Perfusion: Goal: Adequacy of tissue perfusion will improve Outcome: Not Progressing

## 2023-02-03 NOTE — Progress Notes (Signed)
 [facility] PROGRESS NOTE    Progress Note  James Dougherty  FMW:[id number] DOB: 01-Sep-1950 DOA: 01/22/2023 PCP: Atilano Deward ORN, MD     Brief Narrative:   James Dougherty is an 73 y.o. male past medical history significant for obesity, diabetes mellitus type 2, essential hypertension stroke critical right ICA stenosis, chronic kidney disease, lymphedema recently discharged from the hospital on 12/18/2022 for nonhealing left lower extremity wound, underwent left AKA and subsequently discharged to rehab on 01/18/2023 was seen by vascular surgery at the office noted that the left BKA was draining and the lateral aspect of the stump was concerning for infection, but I see remain afebrile with no leukocytosis and asymptomatic it was recommended to follow closely.  On 01/22/2023 was sent from skilled due to draining of the stump and low-grade fever.  Started empirically on IV antibiotics on admission.  Now de-escalated to IV daptomycin . He has an extremely poor prognosis we will go ahead and consult palliative care.   Assessment/Plan:  Amputation stump infection (HCC) Status post I&D of AKA and wound VAC on 01/22/2023 and simple partial closure of left AKA wound VAC placement on 01/25/2023. Intraoperative cultures on 01/22/2023 grew MRSA. ID was consulted recommended IV daptomycin . Vascular surgery recommended left above-knee amputation revision and wound VAC exchange on 02/01/2023. PT evaluated the patient, will need skilled nursing facility placement. Lovenox  aspirin  and Plavix  were held due to bleeding from stump.  Wound VAC has been discontinued. Orthopedic surgery to dictate when to re-start aspirin , Plavix . Restart Lovenox  for DVT prophylaxis.  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 Aspirin  and Plavix  were held due to bleeding from stump, continue statins. He will need angiogram of the left AKA as an outpatient on 02/10/2023.  Chronic  combined systolic and diastolic heart failure/essential hypertension: Currently on metoprolol . Lasix  and Jardiance  are on hold for surgical procedure.  Diabetes mellitus type 2 uncontrolled with peripheral neuropathy: With an A1c of 8.4. Continue long-acting insulin  plus sliding scale. Continue Cymbalta  and Neurontin .  History of CVA/right ICA stenosis/hyperlipidemia: Continue statins.  Hold aspirin  and Plavix  as well as Lovenox .  Chronic kidney disease stage IIIb: His creatinine is at baseline.  Acute blood loss anemia: Lovenox  was held along with aspirin  and Plavix . Hemoglobin has remained stable this morning continue to monitor intermittently.  Obesity: Counseling.  Tinea Cruris: Cont miconazole  powder.  Constipation: Had multiple bowel movements he feels better. Continue MiraLAX  twice a day.  Coccygeal pressure ulcer unstageable present on admission: RN Pressure Injury Documentation: Pressure Injury 11/04/21 Coccyx Mid Deep Tissue Pressure Injury - Purple or maroon localized area of discolored intact skin or blood-filled blister due to damage of underlying soft tissue from pressure and/or shear. non-blanchable purple area surrounded  (Active)  11/04/21 1634  Location: Coccyx  Location Orientation: Mid  Staging: Deep Tissue Pressure Injury - Purple or maroon localized area of discolored intact skin or blood-filled blister due to damage of underlying soft tissue from pressure and/or shear.  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 (Active)  12/11/22 1915  Location: Coccyx  Location Orientation:   Staging: Stage 2 -  Partial thickness loss of dermis presenting as a shallow open injury with a red, pink wound bed without slough.  Wound Description  (  Comments):   Present on Admission: Yes  Dressing Type Foam - Lift dressing to assess site every shift 02/02/23 2215    Estimated body mass index is 28.33 kg/m as calculated from the following:   Height as of this encounter: 5' 8 (1.727 m).   Weight as of this encounter: 84.5 kg.   DVT prophylaxis: None Family Communication:none Status is: Inpatient Remains inpatient appropriate because: Stump infection    Code Status:     Code Status Orders  (From admission, onward)           Start     Ordered   01/22/23 1639  Full code  Continuous       Question:  By:  Answer:  Consent: discussion documented in EHR   01/22/23 1640           Code Status History     Date Active Date Inactive Code Status Order ID Comments User Context   12/11/2022 1522 12/18/2022 1906 Full Code [id number]  Vernon Ranks, MD Inpatient   09/30/2022 1153 09/30/2022 1943 Full Code [id number]  Lanis Fonda BRAVO, MD Inpatient   11/04/2021 1600 11/14/2021 1944 Full Code [id number]  Maurice Sharlet RAMAN, PA-C Inpatient   10/28/2021 1953 11/04/2021 1545 Full Code [id number]  Sebastian Moles, MD ED   04/19/2018 1017 04/19/2018 1744 Full Code [id number]  Serene Gaile ORN, MD Inpatient   04/26/2017 1848 04/28/2017 1826 Full Code [id number]  Bethanie Donnice RIGGERS Inpatient   04/01/2017 2231 04/06/2017 2009 Full Code [id number]  Luke Agent, MD Inpatient      Advance Directive Documentation    Flowsheet Row Most Recent Value  Type of Advance Directive Healthcare Power of Attorney  Pre-existing out of facility DNR order (yellow form or pink MOST form) --  MOST Form in Place? --         IV Access:   Peripheral IV   Procedures and diagnostic studies:   No results found.   Medical Consultants:   None.   Subjective:    James Dougherty pain is controlled.  Objective:    Vitals:   02/02/23 2048 02/02/23 2215 02/02/23 2308 02/03/23 0314  BP: (!) 104/52 (!) 104/58 (!) 91/48 (!) 117/38  Pulse: 73 87 75 67   Resp: 19 18 18 20   Temp: 98.2 F (36.8 C) 98.3 F (36.8 C) 98.3 F (36.8 C) (!) 97.5 F (36.4 C)  TempSrc:  Oral Oral Oral  SpO2: 95% 94% 99% 100%  Weight:  84.5 kg    Height:       SpO2: 100 % O2 Flow Rate (L/min): 2 L/min   Intake/Output Summary (Last 24 hours) at 02/03/2023 0637 Last data filed at 02/03/2023 0100 Gross per 24 hour  Intake 100 ml  Output --  Net 100 ml   Filed Weights   01/25/23 1003 01/29/23 0507 02/02/23 2215  Weight: 89.8 kg 89.6 kg 84.5 kg    Exam: General exam: In no acute distress. Respiratory system: Good air movement and clear to auscultation. Cardiovascular system: S1 & S2 heard, RRR. No JVD. Gastrointestinal system: Abdomen is nondistended, soft and nontender.  Extremities: No pedal edema. Skin: No rashes, lesions or ulcers Psychiatry: Judgement and insight appear normal. Mood & affect appropriate.  Data Reviewed:    Labs: Basic Metabolic Panel: Recent Labs  Lab 01/29/23 0710 02/01/23 0459 02/02/23 0827 02/03/23 0352  NA 139 139 136 136  K 5.0 4.5 5.1 4.6  CL 107 108 102 106  CO2 21* 23 25 24  GLUCOSE 118* 93 202* 116*  BUN 10 11 10 11   CREATININE 1.01 0.99 1.00 1.11  CALCIUM  8.3* 8.0* 8.3* 7.9*  MG 2.2  --   --   --    GFR Estimated Creatinine Clearance: 63.6 mL/min (by C-G formula based on SCr of 1.11 mg/dL). Liver Function Tests: Recent Labs  Lab 01/29/23 0710  AST 35  ALT 48*  ALKPHOS 78  BILITOT 0.9  PROT 5.7*  ALBUMIN  2.4*   No results for input(s): LIPASE, AMYLASE in the last 168 hours. No results for input(s): AMMONIA in the last 168 hours. Coagulation profile No results for input(s): INR, PROTIME in the last 168 hours. COVID-19 Labs  No results for input(s): DDIMER, FERRITIN, LDH, CRP in the last 72 hours.  Lab Results  Component Value Date   SARSCOV2NAA NEGATIVE 10/28/2021    CBC: Recent Labs  Lab 01/29/23 0710 02/01/23 0459 02/02/23 0827  WBC 10.2 7.8 12.4*  NEUTROABS  --   3.9  --   HGB 14.1 11.0* 11.1*  HCT 44.1 35.0* 35.2*  MCV 97.8 99.7 99.7  PLT 334 309 336   Cardiac Enzymes: Recent Labs  Lab 01/28/23 1135  CKTOTAL 30*   BNP (last 3 results) No results for input(s): PROBNP in the last 8760 hours. CBG: Recent Labs  Lab 02/02/23 0613 02/02/23 1130 02/02/23 1607 02/02/23 2115 02/03/23 0606  GLUCAP 122* 101* 122* 120* 105*   D-Dimer: No results for input(s): DDIMER in the last 72 hours. Hgb A1c: No results for input(s): HGBA1C in the last 72 hours. Lipid Profile: No results for input(s): CHOL, HDL, LDLCALC, TRIG, CHOLHDL, LDLDIRECT in the last 72 hours. Thyroid  function studies: No results for input(s): TSH, T4TOTAL, T3FREE, THYROIDAB in the last 72 hours.  Invalid input(s): FREET3 Anemia work up: No results for input(s): VITAMINB12, FOLATE, FERRITIN, TIBC, IRON, RETICCTPCT in the last 72 hours. Sepsis Labs: Recent Labs  Lab 01/29/23 0710 02/01/23 0459 02/02/23 0827  WBC 10.2 7.8 12.4*   Microbiology Recent Results (from the past 240 hours)  MIC (1 Drug)-Wound culture; 01/22/2023; Wound; MRSA / Staph aureus; Daptomycin ; Patient immune status: Normal     Status: Abnormal   Collection Time: 01/28/23  9:06 AM   Specimen: Wound  Result Value Ref Range Status   Min Inhibitory Conc (1 Drug) Final report (A)  Corrected    Comment: (NOTE) Performed At: [facility] [address] [place], [place] [id number] Jennette Shorter MD Ey:[id number] CORRECTED ON 01/06 AT 0735: PREVIOUSLY REPORTED AS Preliminary report    Source WOUND  Final    Comment: LEFT KNEE AMPUTATION Performed at [facility], [address] [place], [place] [address]      Medications:    atorvastatin   80 mg Oral QHS   enoxaparin  (LOVENOX ) injection  40 mg Subcutaneous Q24H   fluconazole   150 mg Oral Q72H   gabapentin   600 mg Oral TID   insulin  aspart  0-15 Units Subcutaneous TID WC   insulin  aspart  0-5  Units Subcutaneous QHS   insulin  glargine-yfgn  10 Units Subcutaneous QHS   metoprolol  succinate  50 mg Oral Daily   miconazole  nitrate   Topical BID   senna-docusate  1 tablet Oral Daily   Continuous Infusions:  DAPTOmycin  700 mg (02/02/23 1427)      LOS: 12 days   Erle Odell Castor  [facility]  02/03/2023, 6:37 AM

## 2023-02-03 NOTE — Progress Notes (Addendum)
  Progress Note    02/03/2023 8:16 AM 2 Days Post-Op  Subjective:  says he had a good night of sleep, one of the best since he has been here. He says he has leg pain only if he moves his stump or if someone presses on it or lifts it   Vitals:   02/02/23 2308 02/03/23 0314  BP: (!) 91/48 (!) 117/38  Pulse: 75 67  Resp: 18 20  Temp: 98.3 F (36.8 C) (!) 97.5 F (36.4 C)  SpO2: 99% 100%   Physical Exam: Cardiac:  regular Lungs:  non labored Incisions:  left AKA dressings clean, dry and intact Extremities:  right foot stable Neurologic: alert and oriented  CBC    Component Value Date/Time   WBC 12.4 (H) 02/02/2023 0827   RBC 3.53 (L) 02/02/2023 0827   HGB 11.1 (L) 02/02/2023 0827   HCT 35.2 (L) 02/02/2023 0827   PLT 336 02/02/2023 0827   MCV 99.7 02/02/2023 0827   MCH 31.4 02/02/2023 0827   MCHC 31.5 02/02/2023 0827   RDW 15.2 02/02/2023 0827   LYMPHSABS 2.4 02/01/2023 0459   MONOABS 0.7 02/01/2023 0459   EOSABS 0.7 (H) 02/01/2023 0459   BASOSABS 0.1 02/01/2023 0459    BMET    Component Value Date/Time   NA 136 02/03/2023 0352   K 4.6 02/03/2023 0352   CL 106 02/03/2023 0352   CO2 24 02/03/2023 0352   GLUCOSE 116 (H) 02/03/2023 0352   BUN 11 02/03/2023 0352   CREATININE 1.11 02/03/2023 0352   CALCIUM  7.9 (L) 02/03/2023 0352   GFRNONAA >60 02/03/2023 0352   GFRAA 47 (L) 04/28/2017 0238    INR    Component Value Date/Time   INR 1.2 12/11/2022 1105     Intake/Output Summary (Last 24 hours) at 02/03/2023 0816 Last data filed at 02/03/2023 0300 Gross per 24 hour  Intake 100 ml  Output 500 ml  Net -400 ml     Assessment/Plan:  73 y.o. male is s/p  I&D left AKA and wound vac 01/22/2023 and simple partial closure of left AKA and wound vac placement 01/25/2023 both by Dr. Magda and left AKA revision with myriad placement by Dr. Lanis  2 Days Post-Op   Still having stump pain but slowly improving Dressings on stump changed yesterday due to superficial  continuous skin bleeding Dressings are in place and dry this morning. Did not take these down to hopefully get some hemostasis Next dressing change per Dr. Lanis Continue Abx   Teretha Damme, PA-C [facility] [phone] 02/03/2023 8:16 AM  VASCULAR STAFF ADDENDUM: I have independently interviewed and examined the patient. I agree with the above.  Stump dry today. Pt seen twice. Pain control improving.  Will order air bed for bottom wounds.  Dressing removal tomorrow with plan for [facility] replacement    Fonda FORBES Lanis MD [facility] Phone Number: [phone] 02/03/2023 6:50 PM

## 2023-02-03 NOTE — Progress Notes (Signed)
 Daughter called to speak to RN.  She inquired re: her dad's pain.  This nurse explained that her dad did not complain of pain prior to lunch time.  Around 11:30am, patient stated that he had a little bit of pain in his leg but only when he moves.  He was provided with 2 tablets of Norco/Vicodin for a 6/10 pain.  Patient was enjoying lunch when RN left patient room.   Upon a visit from OT/PT, when patient was rolled in order to freshen sheets after heavy soil, did patient complain of intense pain.  Patient was then crying and moaning with 10/10 pain in his  R leg.  2mg  of morphine  was provided.  When reassessing patient's pain, this nurse found patient sleeping peacefully.  Care continues.

## 2023-02-03 NOTE — Plan of Care (Signed)
  Problem: Skin Integrity: Goal: Risk for impaired skin integrity will decrease Outcome: Not Progressing   Problem: Skin Integrity: Goal: Risk for impaired skin integrity will decrease Outcome: Not Progressing

## 2023-02-03 NOTE — Consult Note (Signed)
 Consultation Note Date: 02/03/2023   Patient Name: James Dougherty  DOB: 06/29/1950  MRN: [id number]  Age / Sex: 73 y.o., male  PCP: James Deward ORN, MD Referring Physician: Odell Dougherty, Erle, MD  Reason for Consultation: Establishing goals of care  HPI/Patient Profile: 73 y.o. male  with past medical history of DM II, essential hypertension, CVA, right ICA stenosis, CKD stage III, lymphedema and combined chronic systolic and diastolic heart failure.  He was previously discharged from the hospital 12/18/2022 with nonhealing left lower extremity wound.  He underwent left AKA and was subsequently discharged to rehab on 01/18/2023. He was readmitted on 01/22/2023 from SNF with concern for infection in L AKA and sepsis. He underwent I&D and wound vac placement to L AKA 01/22/2023 and simple closure and wound vac change 01/25/23. Collected cx grew MRSA. He underwent L AKA revision and wound vac exchange 02/01/23. Due to his poor prognosis, the patient was referred to palliative care.   Subjective: Patient reports pain in L AKA when touched or if he is moved or turned. He denies nausea and states he has appetite and eating most of his meals when the food is good. He reports his goal is walk again.   We discussed a brief life review of the patient. He shares that prior to his L AKA, he was independent at home, cooking and able to take care of himself. He adds that he just needed help with heavy cleaning.  He reports son, James Dougherty, and daughter, James Dougherty, live in [place], [place]. The patient resides in [place], [place].  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   Clinical Assessment and Goals of Care: I have reviewed medical records including EPIC notes, labs and imaging, and met with patient  to discuss diagnosis prognosis, GOC, EOL wishes, disposition and options. The patient states that he wants to have his daughter, James Dougherty, to hear discussion for goals  of care. The patient contacted daughter via phone. I introduced Palliative Medicine as specialized medical care for people living with serious illness. It focuses on providing relief from the symptoms and stress of a serious illness. The goal is to improve quality of life for both the patient and the family. James Dougherty states that she is not able to discuss goals of care today since she is at work, but will be available tomorrow at noon. Pt and daughter advised that we will have GOC meeting 02/04/2023 @ 1200 via phone. All are in agreement. Daughter to send HPOA form via fax. Palliative care fax number provided.      Primary Decision Maker PATIENT    SUMMARY OF RECOMMENDATIONS   Ongoing goals of care conversation 02/04/2023 Pain management Palliative to follow   Code Status/Advance Care Planning: Full code   Symptom Management:   Tylenol  825mg  PO TID, Initiated today   Norco 5/325 mg q4h PRN  Morphine  2 mg q2h PRN for L AKA pain control.   Zofran  for nausea   Polyethylene glycol, senna-docusate for bowel regimen    Psycho-social/Spiritual:  Additional Recommendations: Caregiving  Support/Resources  Prognosis:  Unable to determine  Discharge Planning: To Be Determined      Primary Diagnoses: Present on Admission:  Amputation stump infection (HCC)  Chronic kidney disease  Neuropathic pain  Peripheral vascular disease, unspecified (HCC)  Type II diabetes mellitus with complication (HCC)   I have reviewed the medical record, interviewed the patient and family, and examined the patient. The following aspects are pertinent.  Past Medical  History:  Diagnosis Date   Arthritis    hands (12/27/2012)   Chronic kidney disease    stage 3, in [place]   Depression 10/26/2022   in [place]   High cholesterol    Hypertension    Neuromuscular disorder (HCC)    feet neuropathy   Neuropathic pain    PAD (peripheral artery disease) (HCC)    Psoriasis    Stroke (HCC) 1999   still have some speech  problems at times; sometimes forget what I was going to say (12/27/2012)   Type II diabetes mellitus (HCC)    Ulcer    Left ankle/leg   Walker as ambulation aid 12/02/2021   or wheelchair   Social History   Socioeconomic History   Marital status: Single    Spouse name: Not on file   Number of children: Not on file   Years of education: Not on file   Highest education level: Not on file  Occupational History   Not on file  Tobacco Use   Smoking status: Former    Current packs/day: 0.00    Average packs/day: 3.0 packs/day for 45.0 years (135.0 ttl pk-yrs)    Types: Cigarettes    Start date: 01/06/1966    Quit date: 01/07/2011    Years since quitting: 12.0    Passive exposure: Never   Smokeless tobacco: Never  Vaping Use   Vaping status: Never Used  Substance and Sexual Activity   Alcohol use: Not Currently    Comment: 12/27/2012 couple times/yr I might have a drink   Drug use: No   Sexual activity: Not Currently  Other Topics Concern   Not on file  Social History Narrative   Not on file   Social Drivers of Health   Financial Resource Strain: Patient Declined (10/20/2022)   Received from [facility]   Overall Financial Resource Strain (CARDIA)    Difficulty of Paying Living Expenses: Patient declined  Food Insecurity: No Food Insecurity (01/22/2023)   Hunger Vital Sign    Worried About Running Out of Food in the Last Year: Never true    Ran Out of Food in the Last Year: Never true  Transportation Needs: No Transportation Needs (01/22/2023)   PRAPARE - Administrator, Civil Service (Medical): No    Lack of Transportation (Non-Medical): No  Physical Activity: Inactive (10/20/2022)   [facility]   Exercise Vital Sign    Days of Exercise per Week: 0 days    Minutes of Exercise per Session: 0 min  Stress: No Stress Concern Present (10/20/2022)   Received from [facility] - Occupational  Stress Questionnaire    Feeling of Stress : Not at all  Social Connections: Unknown (02/01/2023)   Social Connection and Isolation Panel [NHANES]    Frequency of Communication with Friends and Family: Patient declined    Frequency of Social Gatherings with Friends and Family: Patient declined    Attends Religious Services: Patient declined    Database Administrator or Organizations: Patient declined    Attends Banker Meetings: Never    Marital Status: Patient declined   Family History  Problem Relation Age of Onset   Diabetes Sister        Bilateral amputation of lower legs   Diabetes Sister    Heart disease Sister 50       Heart disease before age 75   Hypertension Sister  Heart attack Sister    Hyperlipidemia Sister    Hypertension Father    Hyperlipidemia Father    Hyperlipidemia Mother    Hypertension Mother    Hypertension Daughter    Scheduled Meds:  atorvastatin   80 mg Oral QHS   enoxaparin  (LOVENOX ) injection  40 mg Subcutaneous Q24H   fluconazole   150 mg Oral Q72H   gabapentin   600 mg Oral TID   insulin  aspart  0-15 Units Subcutaneous TID WC   insulin  aspart  0-5 Units Subcutaneous QHS   insulin  glargine-yfgn  10 Units Subcutaneous QHS   metoprolol  succinate  50 mg Oral Daily   miconazole  nitrate   Topical BID   senna-docusate  1 tablet Oral Daily   Continuous Infusions:  DAPTOmycin  700 mg (02/02/23 1427)   PRN Meds:.acetaminophen  **OR** acetaminophen , hydrALAZINE , HYDROcodone -acetaminophen , morphine  injection, ondansetron  (ZOFRAN ) IV Allergies  Allergen Reactions   Oxycodone  Nausea And Vomiting   Glipizide     Bad headache and blurred vision   Other     Soft silicone dressing caused wound to get worse   Bactrim [Sulfamethoxazole-Trimethoprim] Itching and Rash   Review of Systems  Constitutional:  Negative for appetite change.  Gastrointestinal:  Negative for nausea.  Musculoskeletal:        L AKA pain   Skin:  Positive for wound.     Physical Exam Vitals reviewed.  Constitutional:      General: He is not in acute distress.    Appearance: He is not ill-appearing.  Pulmonary:     Effort: Pulmonary effort is normal. No respiratory distress.  Skin:    General: Skin is warm and dry.  Neurological:     Mental Status: He is alert and oriented to person, place, and time.  Psychiatric:        Mood and Affect: Mood normal.        Behavior: Behavior normal.        Thought Content: Thought content normal.        Judgment: Judgment normal.     Vital Signs: BP 111/67 (BP Location: Left Arm)   Pulse 82   Temp 98.8 F (37.1 C) (Oral)   Resp 16   Ht 5' 8 (1.727 m)   Wt 84.5 kg   SpO2 98%   BMI 28.33 kg/m  Pain Scale: 0-10 POSS *See Group Information*: 1-Acceptable,Awake and alert Pain Score: 0-No pain   SpO2: SpO2: 98 % O2 Device:SpO2: 98 % O2 Flow Rate: .O2 Flow Rate (L/min): 2 L/min  IO: Intake/output summary:  Intake/Output Summary (Last 24 hours) at 02/03/2023 1204 Last data filed at 02/03/2023 1128 Gross per 24 hour  Intake 220 ml  Output 1550 ml  Net -1330 ml    LBM: Last BM Date : 01/31/23 Baseline Weight: Weight: 91.6 kg Most recent weight: Weight: 84.5 kg      Palliative Assessment/Data: PPS 50%     Time Total: 60 minutes   Time spent includes: Detailed review of medical records (labs, imaging, vital signs), medically appropriate exam, discussion with treatment team, counseling and educating patient, family and/or staff, documenting clinical information, medication management and coordination of care.     Devere Sacks, ELNITA- [facility] Palliative Medicine Team  02/03/2023 12:04 PM  Office [phone]  Pager [phone]

## 2023-02-03 NOTE — Progress Notes (Signed)
 Occupational Therapy Treatment Patient Details Name: James Dougherty MRN: [id number] DOB: [birth year] Today's Date: 02/03/2023   History of present illness 73 y.o. male presents to [facility] 01/22/23 from [facility] 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 S/p I&D of L AKA and application of wound vac 12/27. s/p I&D and wound vac change 12/30. s/p wound washout and revision of AKA 1/6. Past medical history of arthritis, hyperlipidemia, hypertension, neuropathic pain, PAD, psoriasis, stroke, diabetes, CKD 3B, amputation R great toe 2019, R ICA stenosis   OT comments  Pt in bed upon therapy arrival finishing lunch and agreeable to participate in OT treatment session. Session focused on BUE strength while participating in bed mobility in order to progress OOB activity. Pt assisted with boosting self up in the bed with OT assisting with bed pad. Bed linen was noted to be soiled. With assist from nursing, bed linens were changed. Pt experienced increased pain with any movement and mobility. As OT found pt with pillow under LLE, pt education was provided on using bed feature to lay completely flat for 5 minutes several times a day to decrease/prevent hip flexor tightness and pain. Pt with limited insight in deficits and understanding of education as he requested a pillow to elevate his left leg. Pt educated again on why a pillow under his leg would not be recommended due to the muscle tightness that has already started to set in based on pt's performance during session. Patient will benefit from continued inpatient follow up therapy, <3 hours/day. OT will continue to follow acutely.        If plan is discharge home, recommend the following:  Direct supervision/assist for medications management;Assist for transportation;Two people to help with walking and/or transfers;Two people to help with bathing/dressing/bathroom   Equipment Recommendations  None recommended by  OT       Precautions / Restrictions Precautions Precautions: Fall Precaution Comments: L AKA Restrictions Weight Bearing Restrictions Per Provider Order: Yes LLE Weight Bearing Per Provider Order: Non weight bearing       Mobility Bed Mobility Overal bed mobility: Needs Assistance Bed Mobility: Rolling Rolling: Mod assist, +2 for physical assistance, Used rails, Max assist         General bed mobility comments: Mod A required to roll towards the right using bed rails. Max A with 2 person assist needed for rolling to the left while using rails. Pt was able to assist with boosting self in bed while placed in trendelenburg and set up to use headboard. OT assisted by using bed pads.    Transfers       General transfer comment: Not performed this date     Balance Overall balance assessment: Needs assistance     Sitting balance - Comments: While in bed eating lunch, arrived to pt's room with his trunk leaning all the way to the left in the bed rail and his legs positioned to the right side of the bed. Pt demonstrated poor trunk control and sitting balance even when supported.       ADL either performed or assessed with clinical judgement   ADL Overall ADL's : Needs assistance/impaired Eating/Feeding: Set up;Bed level   Grooming: Wash/dry face;Set up;Bed level    Upper Body Dressing : Maximal assistance;Bed level                      Cognition Arousal: Alert Behavior During Therapy: Anxious Overall Cognitive Status: No  family/caregiver present to determine baseline cognitive functioning   General Comments: Very HOH and self limited by pain and fear of falling. Able to follow commands intermittently when understandable.              General Comments VSS on 2L O2 [place]    Pertinent Vitals/ Pain       Pain Assessment Pain Assessment: Faces Faces Pain Scale: Hurts worst Pain Location: L residual limb and generalized pain with no indicatio of location during any  movement and mobility Pain Descriptors / Indicators: Moaning, Grimacing, Discomfort, Aching, Other (Comment) (yelling) Pain Intervention(s): Limited activity within patient's tolerance, Monitored during session, Repositioned, Premedicated before session         Frequency  Min 1X/week        Progress Toward Goals  OT Goals(current goals can now be found in the care plan section)  Progress towards OT goals: Not progressing toward goals - comment (pt is self limited by pain)            AM-PAC OT 6 Clicks Daily Activity     Outcome Measure   Help from another person eating meals?: A Little Help from another person taking care of personal grooming?: A Little Help from another person toileting, which includes using toliet, bedpan, or urinal?: Total Help from another person bathing (including washing, rinsing, drying)?: Total Help from another person to put on and taking off regular upper body clothing?: A Lot Help from another person to put on and taking off regular lower body clothing?: Total 6 Click Score: 11    End of Session    OT Visit Diagnosis: Unsteadiness on feet (R26.81);Other abnormalities of gait and mobility (R26.89);Muscle weakness (generalized) (M62.81);Pain Pain - Right/Left: Left Pain - part of body: Leg   Activity Tolerance Patient limited by pain   Patient Left in bed;with call bell/phone within reach;with bed alarm set   Nurse Communication Other (comment) (Nursing arrived in room during session and assisted with led linen change during therapy session)        Time: 8774-8745 OT Time Calculation (min): 29 min  Charges: OT General Charges $OT Visit: 1 Visit OT Treatments $Self Care/Home Management : 8-22 mins $Therapeutic Activity: 8-22 mins  Leita Howell, OTR/L,CBIS  Supplemental OT - [facility] and [facility] Secure Chat Preferred    Lorel Lembo, Leita BIRCH 02/03/2023, 1:43 PM

## 2023-02-03 NOTE — Progress Notes (Signed)
 Patient arrived to the floor via bed. AO x4. CHG bath given. Connected to tele and CCMD notified. Oriented pt to room and call bell system. Call bell within reach. Will continue to monitor.

## 2023-02-04 ENCOUNTER — Ambulatory Visit: Payer: Medicare Other | Admitting: Vascular Surgery

## 2023-02-04 DIAGNOSIS — T8740 Infection of amputation stump, unspecified extremity: Secondary | ICD-10-CM | POA: Diagnosis not present

## 2023-02-04 LAB — GLUCOSE, CAPILLARY
Glucose-Capillary: 104 mg/dL — ABNORMAL HIGH (ref 70–99)
Glucose-Capillary: 128 mg/dL — ABNORMAL HIGH (ref 70–99)
Glucose-Capillary: 85 mg/dL (ref 70–99)
Glucose-Capillary: 145 mg/dL — ABNORMAL HIGH (ref 70–99)

## 2023-02-04 LAB — BASIC METABOLIC PANEL
Anion gap: 9 (ref 5–15)
BUN: 16 mg/dL (ref 8–23)
CO2: 27 mmol/L (ref 22–32)
Calcium: 8.4 mg/dL — ABNORMAL LOW (ref 8.9–10.3)
Chloride: 104 mmol/L (ref 98–111)
Creatinine, Ser: 1.1 mg/dL (ref 0.61–1.24)
GFR, Estimated: >60 mL/min (ref >60)
Glucose, Bld: 119 mg/dL — ABNORMAL HIGH (ref 70–99)
Potassium: 4.6 mmol/L (ref 3.5–5.1)
Sodium: 140 mmol/L (ref 135–145)

## 2023-02-04 LAB — CK: Total CK: 474 U/L — ABNORMAL HIGH (ref 49–397)

## 2023-02-04 MED ORDER — NYSTATIN 100000 UNIT/GM EX POWD
Freq: Three times a day (TID) | CUTANEOUS | Status: DC
  Administered 2023-02-11: 1 via TOPICAL
  Filled 2023-02-04 (×2): qty 15

## 2023-02-04 NOTE — TOC Progression Note (Addendum)
 Transition of Care [facility]) - Progression Note    Patient Details  Name: James Dougherty MRN: [id number] Date of Birth: 07-14-1950  Transition of [name] CM/SW Contact  Montie LOISE Louder, [place] Phone Number: 02/04/2023, 2:35 PM  Clinical Narrative:     Patient is form [facility] and is expected to return once stable.  TOC continues to follow and will assist with discharge planning.   Montie Louder, MSW, LCSW Clinical Social Worker    Expected Discharge Plan: Skilled Nursing Facility Barriers to Discharge: Continued Medical Work up  Expected Discharge Plan and Services In-house Referral: Clinical Social Work     Living arrangements for the past 2 months: Skilled Nursing Facility                                       Social Determinants of Health (SDOH) Interventions SDOH Screenings   Food Insecurity: No Food Insecurity (01/22/2023)  Housing: Unknown (01/22/2023)  Transportation Needs: No Transportation Needs (01/22/2023)  Utilities: Not At Risk (01/22/2023)  Depression (PHQ2-9): Low Risk  (12/03/2021)  Financial Resource Strain: Patient Declined (10/20/2022)   Received from [facility]  Physical Activity: Inactive (10/20/2022)   Received from [facility]  Social Connections: Unknown (02/01/2023)  Stress: No Stress Concern Present (10/20/2022)   Received from [facility]  Tobacco Use: Medium Risk (02/01/2023)  Health Literacy: Medium Risk (12/23/2022)   Received from [facility]    Readmission Risk Interventions     No data to display

## 2023-02-04 NOTE — Progress Notes (Signed)
 Physical Therapy Treatment Patient Details Name: James Dougherty MRN: [id number] DOB: February 13, 1950 Today's Date: 02/04/2023   History of Present Illness 73 y.o. 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 S/p I&D of L AKA and application of wound vac 12/27. s/p I&D and wound vac change 12/30. s/p wound washout and revision of AKA 1/6. Past medical history of arthritis, hyperlipidemia, hypertension, neuropathic pain, PAD, psoriasis, stroke, diabetes, CKD 3B, amputation R great toe 2019, R ICA stenosis    PT Comments  Pt received in bed, had recently received pain meds. Pt moaning in pain but able to redirect to task and pt able to participate showing good effort. Max A needed to come to sitting EOB. Pt tolerated EOB sitting >10 mins as well as there ex for LLE and RLE in supine and sitting. Pt attempted sit>stand with use of stedy 10x. Pt given max A and able to relieve pressure from buttocks but unable to achieve full standing on RLE. Discussed proper positioning of LLE and pt showed good carryover from education for last session. Patient will benefit from continued inpatient follow up therapy, <3 hours/day. PT will continue to follow.     If plan is discharge home, recommend the following: A lot of help with walking and/or transfers;A lot of help with bathing/dressing/bathroom;Direct supervision/assist for medications management;Direct supervision/assist for financial management;Assist for transportation;Help with stairs or ramp for entrance   Can travel by private vehicle     No  Equipment Recommendations  Other (comment) (defer)    Recommendations for Other Services       Precautions / Restrictions Precautions Precautions: Fall Precaution Comments: L AKA Restrictions Weight Bearing Restrictions Per Provider Order: Yes LLE Weight Bearing Per Provider Order: Non weight bearing Other  Position/Activity Restrictions: wound vac     Mobility  Bed Mobility Overal bed mobility: Needs Assistance Bed Mobility: Supine to Sit, Sit to Supine     Supine to sit: Max assist, HOB elevated, Used rails Sit to supine: Max assist, +2 for physical assistance   General bed mobility comments: pt able to reach across self to grasp L rail with R hand and assist bringing upper body to L. Max A for RLE and hips to EOB and max A at trunk to come to sitting. Pt needed max A to RLE to return to supine. Assisted with scooting up to [facility] with B hands on headboard.    Transfers Overall transfer level: Needs assistance Equipment used: Ambulation equipment used (STEDY) Transfers: Sit to/from Stand Sit to Stand: Max assist           General transfer comment: pt attempted sit>stand 10x with max A given and pt able to get pressure relief from buttocks but unable to achieve full standing. Pt with good effort Transfer via Lift Equipment: Stedy  Ambulation/Gait               General Gait Details: not able   Stairs             Wheelchair Mobility     Tilt Bed    Modified Rankin (Stroke Patients Only)       Balance Overall balance assessment: Needs assistance Sitting-balance support: No upper extremity supported, Feet supported Sitting balance-Leahy Scale: Fair Sitting balance - Comments: pt maintained sitting EOB >10 mins  holding to rail   Standing balance support: Bilateral upper extremity supported, During functional activity Standing balance-Leahy  Scale: Zero Standing balance comment: unable to achieve full standing                            Cognition Arousal: Alert Behavior During Therapy: Anxious Overall Cognitive Status: No family/caregiver present to determine baseline cognitive functioning                                 General Comments: Very HOH, needs lot of encouragement        Exercises General Exercises - Lower  Extremity Quad Sets: AROM, Right, 10 reps, Supine Gluteal Sets: AROM, Both, 10 reps, Supine (instructed to perform 10x each hour) Amputee Exercises Hip Extension: Left, 5 reps, Supine    General Comments        Pertinent Vitals/Pain Pain Assessment Pain Assessment: Faces Faces Pain Scale: Hurts whole lot Pain Location: L residual limb Pain Descriptors / Indicators: Moaning, Grimacing, Discomfort, Aching, Other (Comment) Pain Intervention(s): Limited activity within patient's tolerance, Monitored during session, Premedicated before session    Home Living                          Prior Function            PT Goals (current goals can now be found in the care plan section) Acute Rehab PT Goals Patient Stated Goal: to get stronger PT Goal Formulation: With patient Time For Goal Achievement: 02/06/23 Potential to Achieve Goals: Fair Progress towards PT goals: Progressing toward goals    Frequency    Min 1X/week      PT Plan      Co-evaluation              AM-PAC PT 6 Clicks Mobility   Outcome Measure  Help needed turning from your back to your side while in a flat bed without using bedrails?: A Lot Help needed moving from lying on your back to sitting on the side of a flat bed without using bedrails?: Total Help needed moving to and from a bed to a chair (including a wheelchair)?: Total Help needed standing up from a chair using your arms (e.g., wheelchair or bedside chair)?: Total Help needed to walk in hospital room?: Total Help needed climbing 3-5 steps with a railing? : Total 6 Click Score: 7    End of Session   Activity Tolerance: Patient tolerated treatment well Patient left: in bed;with call bell/phone within reach;with bed alarm set Nurse Communication: Mobility status PT Visit Diagnosis: Muscle weakness (generalized) (M62.81);Other abnormalities of gait and mobility (R26.89)     Time: 1106-1150 PT Time Calculation (min) (ACUTE  ONLY): 44 min  Charges:    $Therapeutic Exercise: 8-22 mins $Therapeutic Activity: 23-37 mins PT General Charges $$ ACUTE PT VISIT: 1 Visit                     James Dougherty, PT  Acute Rehab Services Secure chat preferred Office [phone]    James Dougherty 02/04/2023, 2:21 PM

## 2023-02-04 NOTE — Plan of Care (Signed)
  Problem: Elimination: Goal: Will not experience complications related to urinary retention Outcome: Progressing   Problem: Fluid Volume: Goal: Ability to maintain a balanced intake and output will improve Outcome: Progressing

## 2023-02-04 NOTE — Progress Notes (Signed)
 Patient rec'd 2mg  of morphine prior to PA(s) applying wound vac to L AKA.  Upon PA team leaving, patient was loudly moaning and crying in pain.  This nurse paged PA, who advised giving patient 2 tablets of Norco.  Care continues.

## 2023-02-04 NOTE — Progress Notes (Addendum)
 Daily Progress Note   Patient Name: James Dougherty       Date: 02/04/2023 DOB: May 16, 1950  Age: 73 y.o. MRN#: [id number] Attending Physician: Rosario Leatrice FERNS, MD Primary Care Physician: Atilano Deward ORN, MD Admit Date: 01/22/2023  Reason for Consultation/Follow-up: Establishing goals of care  HPI/Patient Profile: 73 y.o. male  with past medical history of DM II, essential hypertension, CVA, right ICA stenosis, CKD stage III, lymphedema and combined chronic systolic and diastolic heart failure.  He was previously discharged from the hospital 12/18/2022 with nonhealing left lower extremity wound.  He underwent left AKA and was subsequently discharged to rehab on 01/18/2023. He was readmitted on 01/22/2023 from SNF with concern for infection in L AKA and sepsis. He underwent I&D and wound vac placement to L AKA 01/22/2023 and simple closure and wound vac change 01/25/23. Collected cx grew MRSA. He underwent L AKA revision and wound vac exchange 02/01/23. Due to his poor prognosis, the patient was referred to palliative care.   Subjective: Pt denies pain at this time, states he just received his pain medication. He adds that he is eating his meals when they're good.   Length of Stay: 13  Current Medications: Scheduled Meds:   acetaminophen   825 mg Oral TID   atorvastatin   80 mg Oral QHS   enoxaparin  (LOVENOX ) injection  40 mg Subcutaneous Q24H   fluconazole   150 mg Oral Q72H   gabapentin   600 mg Oral TID   insulin  aspart  0-15 Units Subcutaneous TID WC   insulin  aspart  0-5 Units Subcutaneous QHS   insulin  glargine-yfgn  10 Units Subcutaneous QHS   metoprolol  succinate  50 mg Oral Daily   nystatin    Topical TID   senna-docusate  1 tablet Oral Daily    Continuous Infusions:  DAPTOmycin  700 mg  (02/03/23 1438)    PRN Meds: acetaminophen  **OR** acetaminophen , hydrALAZINE , HYDROcodone -acetaminophen , morphine  injection, ondansetron  (ZOFRAN ) IV  Physical Exam Vitals reviewed.  Constitutional:      General: He is not in acute distress.    Appearance: He is not ill-appearing.  Pulmonary:     Effort: Pulmonary effort is normal. No respiratory distress.  Skin:    General: Skin is warm and dry.  Neurological:     Mental Status: He is alert and oriented to person, place, and time.  Psychiatric:        Mood and Affect: Mood normal.        Behavior: Behavior normal.        Thought Content: Thought content normal.        Judgment: Judgment normal.             Vital Signs: BP (!) 133/47 (BP Location: Right Arm)   Pulse (!) 40   Temp (!) 97.4 F (36.3 C) (Oral)   Resp 16   Ht 5' 8 (1.727 m)   Wt 84.5 kg   SpO2 98%   BMI 28.33 kg/m  SpO2: SpO2: 98 % O2 Device: O2 Device: Room Air O2 Flow Rate: O2 Flow Rate (L/min): 2 L/min  Intake/output summary:  Intake/Output Summary (Last 24 hours) at 02/04/2023 1244 Last data filed at 02/03/2023 2000 Gross per 24 hour  Intake 120 ml  Output 250 ml  Net -130 ml   LBM: Last BM Date : 01/31/23 Baseline Weight: Weight: 91.6 kg Most recent weight: Weight: 84.5 kg       Palliative Assessment/Data: PPS 50%      Patient Active Problem List   Diagnosis Date Noted   Amputation stump infection (HCC) 01/22/2023   Multiple open wounds of left lower extremity 12/11/2022   S/P AKA (above knee amputation) unilateral, left (HCC) 12/11/2022   S/P AKA (above knee amputation) unilateral (HCC) 12/11/2022   Lymphedema 12/03/2021   Neuropathic pain 12/03/2021   TBI (traumatic brain injury) (HCC) 11/04/2021   Subdural hematoma (HCC) 10/28/2021   Gangrene (HCC) 04/26/2017   Cellulitis 04/01/2017   Gangrene of toe (HCC) 04/01/2017   Anemia 04/01/2017   Hyperkalemia 04/01/2017   Chronic kidney disease 04/01/2017   Type II diabetes mellitus  with complication (HCC) 04/01/2017   Dry gangrene (HCC) 04/01/2017   Groin pain 07/04/2014   Peripheral vascular disease, unspecified (HCC) 06/27/2013   PAD (peripheral artery disease) (HCC) 12/27/2012   PVD (peripheral vascular disease) (HCC) 12/12/2012   Atherosclerosis of native arteries of the extremities with ulceration (HCC) 03/21/2012   Pain in limb 03/21/2012    Palliative Care Assessment & Plan    Assessment: I have reviewed medical records including EPIC notes, labs and imaging, and met with patient to discuss diagnosis prognosis, GOC, EOL wishes, disposition and options.   Elderly male resting quietly in bed. He awakens to light tactile stimuli. He is in no distress. Patient daughter, Maurilio, was unable to be contacted at previously scheduled time for GOC phone meeting today. She was  contacted later and offers availability tomorrow, 02/05/2023, at 12:00 for phone meeting. Faxed documents from daughter received today only contained durable POA, no HPOA. Maurilio agrees to have consult placed for HPOA to be assigned by her father by spiritual support team. This was discussed with the patient who agrees that Maurilio is to be his HPOA. Spiritual consult placed for HPOA assignment. Code status such as FULL CODE and DNR was discussed in detail with patient. When asked about what his wishes would be in these situations, he responds I don't know. MOST form was given and left at bedside for him to review with his daughter prior to meeting tomorrow. Mr. Kearley aware that GOC meeting rescheduled for tomorrow with Maurilio at noon. The patient had no other questions or concerns.    Recommendations/Plan:  Ongoing goals of care conversation with daughter 02/05/2023 Pain management Palliative to follow   Goals of Care and Additional Recommendations: Limitations on Scope of Treatment: Full Scope Treatment  Code Status: Full code  Prognosis:  Unable to determine  Discharge Planning: To Be  Determined  Care plan was discussed with primary RN, chaplain.   Thank you for allowing the Palliative Medicine Team to assist in the care of this patient.  Total Time: 45 minutes   Time spent includes: Detailed review of medical records (labs, imaging, vital signs), medically appropriate exam, discussion with treatment team, counseling and educating patient, family and/or staff, documenting clinical information, medication management and coordination of care.        Devere Sacks, AMANDA [facility] Palliative Medicine Team  02/04/2023 12:44 PM  Office [phone]  Pager [phone]

## 2023-02-04 NOTE — Progress Notes (Signed)
                                                     Daily Progress Note   Attempted to see patient and have goals of discussion care with daughter via phone conference. Due to being recently medicated for severe pain, decision was made to postpone meeting until tomorrow. Plan to see pt later today for goals of care discussion when he is more alert. Daughter updated on plan  for phone meeting tomorrow (1/10) at noon.       Devere Sacks, ELNITA- [facility] Palliative Medicine Team  02/04/2023 12:20 PM  Office [phone]  Pager [phone]  NO CHARGE

## 2023-02-04 NOTE — Progress Notes (Signed)
  Progress Note    02/04/2023 11:02 AM 3 Days Post-Op  Left AKA dressings removed. Minimal bloody drainage at base of wound. Overall dry and well appearing Blue sponge and VAC applied at 125 mmHg with good seal achieved Patient was premedicated but overall a lot of discomfort in AMP site with any manipulation  Teretha Damme, PA-C [facility] [phone] 02/04/2023 11:02 AM

## 2023-02-04 NOTE — Progress Notes (Signed)
 [facility] PROGRESS NOTE    Progress Note  James Dougherty  FMW:[id number] DOB: 1950/04/07 DOA: 01/22/2023 PCP: Atilano Deward ORN, MD     Brief Narrative:   James Dougherty is an 73 y.o. male past medical history significant for obesity, diabetes mellitus type 2, essential hypertension stroke critical right ICA stenosis, chronic kidney disease, lymphedema recently discharged from the hospital on 12/18/2022 for nonhealing left lower extremity wound, underwent left AKA and subsequently discharged to rehab on 01/18/2023 was seen by vascular surgery at the office noted that the left BKA was draining and the lateral aspect of the stump was concerning for infection, but I see remain afebrile with no leukocytosis and asymptomatic it was recommended to follow closely.  On 01/22/2023 was sent from skilled due to draining of the stump and low-grade fever.  Started empirically on IV antibiotics on admission.  Now de-escalated to IV daptomycin . He has an extremely poor prognosis we will go ahead and consult palliative care.  02/04/2023: Patient seen.  Patient is very hard of hearing.  No new complaints.   Assessment/Plan:  Amputation stump infection (HCC) Status post I&D of AKA and wound VAC on 01/22/2023 and simple partial closure of left AKA wound VAC placement on 01/25/2023. Intraoperative cultures on 01/22/2023 grew MRSA. ID was consulted recommended IV daptomycin . Vascular surgery recommended left above-knee amputation revision and wound VAC exchange on 02/01/2023. PT evaluated the patient, will need skilled nursing facility placement. Lovenox  aspirin  and Plavix  were held due to bleeding from stump.  Wound VAC has been discontinued. Orthopedic surgery to dictate when to re-start aspirin , Plavix . Restart Lovenox  for DVT prophylaxis. 02/04/2023: Pursue disposition: Once cleared for discharge by the surgical team  Peripheral vascular disease, unspecified (HCC)/status post left AKA on  12/11/2022. Aspirin  and Plavix  were held due to bleeding from stump, continue statins. He will need angiogram of the left AKA as an outpatient on 02/10/2023.  Chronic combined systolic and diastolic heart failure/essential hypertension: Currently on metoprolol . Lasix  and Jardiance  are on hold for surgical procedure.  Diabetes mellitus type 2 uncontrolled with peripheral neuropathy: With an A1c of 8.4. Continue long-acting insulin  plus sliding scale. Continue Cymbalta  and Neurontin .  History of CVA/right ICA stenosis/hyperlipidemia: Continue statins.  Hold aspirin  and Plavix  as well as Lovenox .  Chronic kidney disease stage IIIb: His creatinine is at baseline.  Acute blood loss anemia: Lovenox  was held along with aspirin  and Plavix . Hemoglobin has remained stable this morning continue to monitor intermittently.  Obesity: Counseling.  Tinea Cruris: Cont miconazole  powder.  Constipation: Had multiple bowel movements he feels better. Continue MiraLAX  twice a day.  Coccygeal pressure ulcer unstageable present on admission: RN Pressure Injury Documentation: Pressure Injury 11/04/21 Coccyx Mid Deep Tissue Pressure Injury - Purple or maroon localized area of discolored intact skin or blood-filled blister due to damage of underlying soft tissue from pressure and/or shear. non-blanchable purple area surrounded  (Active)  11/04/21 1634  Location: Coccyx  Location Orientation: Mid  Staging: Deep Tissue Pressure Injury - Purple or maroon localized area of discolored intact skin or blood-filled blister due to damage of underlying soft tissue from pressure and/or shear.  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 (Active)  12/11/22 1915  Location: Coccyx   Location Orientation:   Staging:  Stage 2 -  Partial thickness loss of dermis presenting as a shallow open injury with a red, pink wound bed without slough.  Wound Description (Comments):   Present on Admission: Yes  Dressing Type Foam - Lift dressing to assess site every shift 02/04/23 1100    Estimated body mass index is 28.33 kg/m as calculated from the following:   Height as of this encounter: 5' 8 (1.727 m).   Weight as of this encounter: 84.5 kg.   DVT prophylaxis: None Family Communication:none Status is: Inpatient Remains inpatient appropriate because: Stump infection    Code Status:     Code Status Orders  (From admission, onward)           Start     Ordered   01/22/23 1639  Full code  Continuous       Question:  By:  Answer:  Consent: discussion documented in EHR   01/22/23 1640           Code Status History     Date Active Date Inactive Code Status Order ID Comments User Context   12/11/2022 1522 12/18/2022 1906 Full Code [id number]  Vernon Ranks, MD Inpatient   09/30/2022 1153 09/30/2022 1943 Full Code [id number]  Lanis Fonda BRAVO, MD Inpatient   11/04/2021 1600 11/14/2021 1944 Full Code [id number]  Maurice Sharlet RAMAN, PA-C Inpatient   10/28/2021 1953 11/04/2021 1545 Full Code [id number]  Sebastian Moles, MD ED   04/19/2018 1017 04/19/2018 1744 Full Code [id number]  Serene Gaile ORN, MD Inpatient   04/26/2017 1848 04/28/2017 1826 Full Code [id number]  Bethanie Donnice RIGGERS Inpatient   04/01/2017 2231 04/06/2017 2009 Full Code [id number]  Luke Agent, MD Inpatient      Advance Directive Documentation    Flowsheet Row Most Recent Value  Type of Advance Directive Healthcare Power of Attorney  Pre-existing out of facility DNR order (yellow form or pink MOST form) --  MOST Form in Place? --         IV Access:   Peripheral IV   Procedures and diagnostic studies:   No results found.   Medical Consultants:   None.   Subjective:    James Dougherty  pain is controlled.  Objective:    Vitals:   02/04/23 0421 02/04/23 0424 02/04/23 0851 02/04/23 1141  BP:   (!) 100/51 (!) 133/47  Pulse: (!) 55 (!) 54 74 (!) 40  Resp: 17 17 14 16   Temp:   (!) 97.5 F (36.4 C) (!) 97.4 F (36.3 C)  TempSrc:   Axillary Oral  SpO2:  100% 99% 98%  Weight:      Height:       SpO2: 98 % O2 Flow Rate (L/min): 2 L/min   Intake/Output Summary (Last 24 hours) at 02/04/2023 1614 Last data filed at 02/03/2023 2000 Gross per 24 hour  Intake 120 ml  Output --  Net 120 ml   Filed Weights   01/25/23 1003 01/29/23 0507 02/02/23 2215  Weight: 89.8 kg 89.6 kg 84.5 kg    Exam: General exam: Patient is very hard of hearing.  Awake and alert.  Not in any distress.   Respiratory system: Clear to auscultation. Cardiovascular system: S1 & S2 heard. Gastrointestinal system: Abdomen is soft and nontender.  Extremities: Status post left AKA, with wound VAC.     Labs: Basic Metabolic Panel: Recent Labs  Lab 01/29/23 0710 02/01/23 0459 02/02/23 0827 02/03/23 0352 02/04/23 0638  NA 139 139 136 136 140  K 5.0  4.5 5.1 4.6 4.6  CL 107 108 102 106 104  CO2 21* 23 25 24 27   GLUCOSE 118* 93 202* 116* 119*  BUN 10 11 10 11 16   CREATININE 1.01 0.99 1.00 1.11 1.10  CALCIUM  8.3* 8.0* 8.3* 7.9* 8.4*  MG 2.2  --   --   --   --    GFR Estimated Creatinine Clearance: 64.2 mL/min (by C-G formula based on SCr of 1.1 mg/dL). Liver Function Tests: Recent Labs  Lab 01/29/23 0710  AST 35  ALT 48*  ALKPHOS 78  BILITOT 0.9  PROT 5.7*  ALBUMIN  2.4*   No results for input(s): LIPASE, AMYLASE in the last 168 hours. No results for input(s): AMMONIA in the last 168 hours. Coagulation profile No results for input(s): INR, PROTIME in the last 168 hours. COVID-19 Labs  No results for input(s): DDIMER, FERRITIN, LDH, CRP in the last 72 hours.  Lab Results  Component Value Date   SARSCOV2NAA NEGATIVE 10/28/2021    CBC: Recent Labs  Lab  01/29/23 0710 02/01/23 0459 02/02/23 0827  WBC 10.2 7.8 12.4*  NEUTROABS  --  3.9  --   HGB 14.1 11.0* 11.1*  HCT 44.1 35.0* 35.2*  MCV 97.8 99.7 99.7  PLT 334 309 336   Cardiac Enzymes: Recent Labs  Lab 02/04/23 0638  CKTOTAL 474*   BNP (last 3 results) No results for input(s): PROBNP in the last 8760 hours. CBG: Recent Labs  Lab 02/03/23 1126 02/03/23 1629 02/03/23 2057 02/04/23 0659 02/04/23 1139  GLUCAP 122* 115* 159* 104* 128*   D-Dimer: No results for input(s): DDIMER in the last 72 hours. Hgb A1c: No results for input(s): HGBA1C in the last 72 hours. Lipid Profile: No results for input(s): CHOL, HDL, LDLCALC, TRIG, CHOLHDL, LDLDIRECT in the last 72 hours. Thyroid  function studies: No results for input(s): TSH, T4TOTAL, T3FREE, THYROIDAB in the last 72 hours.  Invalid input(s): FREET3 Anemia work up: No results for input(s): VITAMINB12, FOLATE, FERRITIN, TIBC, IRON, RETICCTPCT in the last 72 hours. Sepsis Labs: Recent Labs  Lab 01/29/23 0710 02/01/23 0459 02/02/23 0827  WBC 10.2 7.8 12.4*   Microbiology Recent Results (from the past 240 hours)  MIC (1 Drug)-Wound culture; 01/22/2023; Wound; MRSA / Staph aureus; Daptomycin ; Patient immune status: Normal     Status: Abnormal   Collection Time: 01/28/23  9:06 AM   Specimen: Wound  Result Value Ref Range Status   Min Inhibitory Conc (1 Drug) Final report (A)  Corrected    Comment: (NOTE) Performed At: [facility] [address] [place], [place] [id number] Jennette Shorter MD Ey:[id number] CORRECTED ON 01/06 AT 0735: PREVIOUSLY REPORTED AS Preliminary report    Source WOUND  Final    Comment: LEFT KNEE AMPUTATION Performed at [facility], [address] [place], [place] [address]      Medications:    acetaminophen   825 mg Oral TID   atorvastatin   80 mg Oral QHS   enoxaparin  (LOVENOX ) injection  40 mg Subcutaneous Q24H   fluconazole   150  mg Oral Q72H   gabapentin   600 mg Oral TID   insulin  aspart  0-15 Units Subcutaneous TID WC   insulin  aspart  0-5 Units Subcutaneous QHS   insulin  glargine-yfgn  10 Units Subcutaneous QHS   metoprolol  succinate  50 mg Oral Daily   nystatin    Topical TID   senna-docusate  1 tablet Oral Daily   Continuous Infusions:  DAPTOmycin  700 mg (02/04/23 1410)  LOS: 13 days   Leatrice LILLETTE Chapel  [facility]  02/04/2023, 4:14 PM

## 2023-02-04 NOTE — Progress Notes (Signed)
  Progress Note    02/04/2023 8:28 AM 3 Days Post-Op  Subjective:  No complaints this morning. Pain controlled unless moving    Vitals:   02/04/23 0421 02/04/23 0424  BP:    Pulse: (!) 55 (!) 54  Resp: 17 17  Temp:    SpO2:  100%   Physical Exam: Cardiac:  regular Lungs:  non labored Incisions:  left AKA dressings clean, dry and intact Extremities:  right foot stable Neurologic: alert and oriented  CBC    Component Value Date/Time   WBC 12.4 (H) 02/02/2023 0827   RBC 3.53 (L) 02/02/2023 0827   HGB 11.1 (L) 02/02/2023 0827   HCT 35.2 (L) 02/02/2023 0827   PLT 336 02/02/2023 0827   MCV 99.7 02/02/2023 0827   MCH 31.4 02/02/2023 0827   MCHC 31.5 02/02/2023 0827   RDW 15.2 02/02/2023 0827   LYMPHSABS 2.4 02/01/2023 0459   MONOABS 0.7 02/01/2023 0459   EOSABS 0.7 (H) 02/01/2023 0459   BASOSABS 0.1 02/01/2023 0459    BMET    Component Value Date/Time   NA 140 02/04/2023 0638   K 4.6 02/04/2023 0638   CL 104 02/04/2023 0638   CO2 27 02/04/2023 0638   GLUCOSE 119 (H) 02/04/2023 0638   BUN 16 02/04/2023 0638   CREATININE 1.10 02/04/2023 0638   CALCIUM  8.4 (L) 02/04/2023 0638   GFRNONAA >60 02/04/2023 0638   GFRAA 47 (L) 04/28/2017 0238    INR    Component Value Date/Time   INR 1.2 12/11/2022 1105     Intake/Output Summary (Last 24 hours) at 02/04/2023 0828 Last data filed at 02/03/2023 2000 Gross per 24 hour  Intake 240 ml  Output 1300 ml  Net -1060 ml     Assessment/Plan:  73 y.o. male is s/p  I&D left AKA and wound vac 01/22/2023 and simple partial closure of left AKA and wound vac placement 01/25/2023 both by Dr. Magda and left AKA revision with myriad placement by Dr. Lanis  3 Days Post-Op   Still having stump pain but slowly improving Will transition to [facility] today. Will premedicate Air bed ordered. Bottom and groins concerning due to raw surfaces. Trying to prevent wounds.  Continue Abx   Radin Raptis E Jerrin Recore MD [facility] Phone Number: [phone] 02/04/2023 8:28 AM

## 2023-02-05 DIAGNOSIS — Z515 Encounter for palliative care: Secondary | ICD-10-CM

## 2023-02-05 DIAGNOSIS — T8740 Infection of amputation stump, unspecified extremity: Secondary | ICD-10-CM | POA: Diagnosis not present

## 2023-02-05 DIAGNOSIS — Z7189 Other specified counseling: Secondary | ICD-10-CM

## 2023-02-05 LAB — CK: Total CK: 224 U/L (ref 49–397)

## 2023-02-05 LAB — GLUCOSE, CAPILLARY
Glucose-Capillary: 93 mg/dL (ref 70–99)
Glucose-Capillary: 123 mg/dL — ABNORMAL HIGH (ref 70–99)
Glucose-Capillary: 127 mg/dL — ABNORMAL HIGH (ref 70–99)
Glucose-Capillary: 106 mg/dL — ABNORMAL HIGH (ref 70–99)

## 2023-02-05 MED ORDER — ASPIRIN 81 MG PO TBEC
81.0000 mg | DELAYED_RELEASE_TABLET | Freq: Every day | ORAL | Status: DC
  Administered 2023-02-05 – 2023-02-12 (×8): 81 mg via ORAL
  Filled 2023-02-05 (×8): qty 1

## 2023-02-05 NOTE — Progress Notes (Signed)
 [facility] PROGRESS NOTE    Progress Note  James Dougherty  FMW:[id number] DOB: 09-09-1950 DOA: 01/22/2023 PCP: Atilano Deward ORN, MD     Brief Narrative:   James Dougherty is an 73 y.o. male past medical history significant for obesity, diabetes mellitus type 2, essential hypertension stroke critical right ICA stenosis, chronic kidney disease, lymphedema recently discharged from the hospital on 12/18/2022 for nonhealing left lower extremity wound, underwent left AKA and subsequently discharged to rehab on 01/18/2023 was seen by vascular surgery at the office noted that the left BKA was draining and the lateral aspect of the stump was concerning for infection, but I see remain afebrile with no leukocytosis and asymptomatic it was recommended to follow closely.  On 01/22/2023 was sent from skilled due to draining of the stump and low-grade fever.  Started empirically on IV antibiotics on admission.  Now de-escalated to IV daptomycin . He has an extremely poor prognosis we will go ahead and consult palliative care.  02/05/2023: Patient seen.  Patient is very hard of hearing.  No new complaints.   Assessment/Plan:  Amputation stump infection (HCC) Status post I&D of AKA and wound VAC on 01/22/2023 and simple partial closure of left AKA wound VAC placement on 01/25/2023. Intraoperative cultures on 01/22/2023 grew MRSA. ID was consulted recommended IV daptomycin . Vascular surgery recommended left above-knee amputation revision and wound VAC exchange on 02/01/2023. PT evaluated the patient, will need skilled nursing facility placement. Lovenox  aspirin  and Plavix  were held due to bleeding from stump.  Wound VAC has been discontinued. Orthopedic surgery to dictate when to re-start aspirin , Plavix . Restart Lovenox  for DVT prophylaxis. 02/05/2023: Pursue disposition: Once cleared for discharge by the surgical team  Peripheral vascular disease, unspecified (HCC)/status post left AKA on  12/11/2022. Aspirin  and Plavix  were held due to bleeding from stump, continue statins. He will need angiogram of the left AKA as an outpatient on 02/10/2023.  Chronic combined systolic and diastolic heart failure/essential hypertension: Currently on metoprolol . Lasix  and Jardiance  are on hold for surgical procedure.  Diabetes mellitus type 2 uncontrolled with peripheral neuropathy: With an A1c of 8.4. Continue long-acting insulin  plus sliding scale. Continue Cymbalta  and Neurontin .  History of CVA/right ICA stenosis/hyperlipidemia: Continue statins.  Hold aspirin  and Plavix  as well as Lovenox .  Chronic kidney disease stage IIIb: His creatinine is at baseline.  Acute blood loss anemia: Lovenox  was held along with aspirin  and Plavix . Hemoglobin has remained stable this morning continue to monitor intermittently.  Obesity: Counseling.  Tinea Cruris: Cont miconazole  powder.  Constipation: Had multiple bowel movements he feels better. Continue MiraLAX  twice a day.  Coccygeal pressure ulcer unstageable present on admission: RN Pressure Injury Documentation: Pressure Injury 11/04/21 Coccyx Mid Deep Tissue Pressure Injury - Purple or maroon localized area of discolored intact skin or blood-filled blister due to damage of underlying soft tissue from pressure and/or shear. non-blanchable purple area surrounded  (Active)  11/04/21 1634  Location: Coccyx  Location Orientation: Mid  Staging: Deep Tissue Pressure Injury - Purple or maroon localized area of discolored intact skin or blood-filled blister due to damage of underlying soft tissue from pressure and/or shear.  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 (Active)  12/11/22 1915  Location: Coccyx   Location Orientation:   Staging:  Stage 2 -  Partial thickness loss of dermis presenting as a shallow open injury with a red, pink wound bed without slough.  Wound Description (Comments):   Present on Admission: Yes  Dressing Type Foam - Lift dressing to assess site every shift 02/05/23 0800    Estimated body mass index is 29.73 kg/m as calculated from the following:   Height as of this encounter: 5' 8 (1.727 m).   Weight as of this encounter: 88.7 kg.   DVT prophylaxis: None Family Communication:none Status is: Inpatient Remains inpatient appropriate because: Stump infection    Code Status:     Code Status Orders  (From admission, onward)           Start     Ordered   01/22/23 1639  Full code  Continuous       Question:  By:  Answer:  Consent: discussion documented in EHR   01/22/23 1640           Code Status History     Date Active Date Inactive Code Status Order ID Comments User Context   12/11/2022 1522 12/18/2022 1906 Full Code [id number]  Vernon Ranks, MD Inpatient   09/30/2022 1153 09/30/2022 1943 Full Code [id number]  Lanis Fonda BRAVO, MD Inpatient   11/04/2021 1600 11/14/2021 1944 Full Code [id number]  Maurice Sharlet RAMAN, PA-C Inpatient   10/28/2021 1953 11/04/2021 1545 Full Code [id number]  Sebastian Moles, MD ED   04/19/2018 1017 04/19/2018 1744 Full Code [id number]  Serene Gaile ORN, MD Inpatient   04/26/2017 1848 04/28/2017 1826 Full Code [id number]  Bethanie Donnice RIGGERS Inpatient   04/01/2017 2231 04/06/2017 2009 Full Code [id number]  Luke Agent, MD Inpatient      Advance Directive Documentation    Flowsheet Row Most Recent Value  Type of Advance Directive Healthcare Power of Attorney  Pre-existing out of facility DNR order (yellow form or pink MOST form) --  MOST Form in Place? --         IV Access:   Peripheral IV   Procedures and diagnostic studies:   No results found.   Medical Consultants:   None.   Subjective:    James Dougherty  pain is controlled.  Objective:    Vitals:   02/05/23 0343 02/05/23 0758 02/05/23 1120 02/05/23 1555  BP: 125/60 107/61 129/71 (!) 110/56  Pulse: (!) 52 (!) 57 93 (!) 54  Resp: 13 11 14 11   Temp: 97.8 F (36.6 C) (!) 97.5 F (36.4 C) 97.6 F (36.4 C) (!) 97.4 F (36.3 C)  TempSrc: Oral Oral Oral Oral  SpO2: 97% 98% 98% 100%  Weight:  88.7 kg    Height:       SpO2: 100 % O2 Flow Rate (L/min): 2 L/min   Intake/Output Summary (Last 24 hours) at 02/05/2023 1838 Last data filed at 02/05/2023 0801 Gross per 24 hour  Intake 120 ml  Output 600 ml  Net -480 ml   Filed Weights   02/02/23 2215 02/05/23 0256 02/05/23 0758  Weight: 84.5 kg 84.4 kg 88.7 kg    Exam: General exam: Patient is very hard of hearing.  Awake and alert.  Not in any distress.   Respiratory system: Clear to auscultation. Cardiovascular system: S1 & S2 heard. Gastrointestinal system: Abdomen is soft and nontender.  Extremities: Status post left AKA, with wound VAC.     Labs: Basic Metabolic Panel: Recent Labs  Lab 02/01/23 0459 02/02/23 0827 02/03/23 0352 02/04/23 0638  NA 139 136 136  140  K 4.5 5.1 4.6 4.6  CL 108 102 106 104  CO2 23 25 24 27   GLUCOSE 93 202* 116* 119*  BUN 11 10 11 16   CREATININE 0.99 1.00 1.11 1.10  CALCIUM  8.0* 8.3* 7.9* 8.4*   GFR Estimated Creatinine Clearance: 65.7 mL/min (by C-G formula based on SCr of 1.1 mg/dL). Liver Function Tests: No results for input(s): AST, ALT, ALKPHOS, BILITOT, PROT, ALBUMIN  in the last 168 hours.  No results for input(s): LIPASE, AMYLASE in the last 168 hours. No results for input(s): AMMONIA in the last 168 hours. Coagulation profile No results for input(s): INR, PROTIME in the last 168 hours. COVID-19 Labs  No results for input(s): DDIMER, FERRITIN, LDH, CRP in the last 72 hours.  Lab Results  Component Value Date   SARSCOV2NAA NEGATIVE 10/28/2021    CBC: Recent Labs  Lab 02/01/23 0459  02/02/23 0827  WBC 7.8 12.4*  NEUTROABS 3.9  --   HGB 11.0* 11.1*  HCT 35.0* 35.2*  MCV 99.7 99.7  PLT 309 336   Cardiac Enzymes: Recent Labs  Lab 02/04/23 0638 02/05/23 0330  CKTOTAL 474* 224   BNP (last 3 results) No results for input(s): PROBNP in the last 8760 hours. CBG: Recent Labs  Lab 02/04/23 1708 02/04/23 2114 02/05/23 0634 02/05/23 1121 02/05/23 1556  GLUCAP 85 145* 93 123* 127*   D-Dimer: No results for input(s): DDIMER in the last 72 hours. Hgb A1c: No results for input(s): HGBA1C in the last 72 hours. Lipid Profile: No results for input(s): CHOL, HDL, LDLCALC, TRIG, CHOLHDL, LDLDIRECT in the last 72 hours. Thyroid  function studies: No results for input(s): TSH, T4TOTAL, T3FREE, THYROIDAB in the last 72 hours.  Invalid input(s): FREET3 Anemia work up: No results for input(s): VITAMINB12, FOLATE, FERRITIN, TIBC, IRON, RETICCTPCT in the last 72 hours. Sepsis Labs: Recent Labs  Lab 02/01/23 0459 02/02/23 0827  WBC 7.8 12.4*   Microbiology Recent Results (from the past 240 hours)  MIC (1 Drug)-Wound culture; 01/22/2023; Wound; MRSA / Staph aureus; Daptomycin ; Patient immune status: Normal     Status: Abnormal   Collection Time: 01/28/23  9:06 AM   Specimen: Wound  Result Value Ref Range Status   Min Inhibitory Conc (1 Drug) Final report (A)  Corrected    Comment: (NOTE) Performed At: [facility] [address] [place], [place] [id number] Jennette Shorter MD Ey:[id number] CORRECTED ON 01/06 AT 0735: PREVIOUSLY REPORTED AS Preliminary report    Source WOUND  Final    Comment: LEFT KNEE AMPUTATION Performed at [facility], [address] [place], [place] [address]      Medications:    acetaminophen   825 mg Oral TID   aspirin  EC  81 mg Oral Daily   atorvastatin   80 mg Oral QHS   enoxaparin  (LOVENOX ) injection  40 mg Subcutaneous Q24H   gabapentin   600 mg Oral TID   insulin  aspart   0-15 Units Subcutaneous TID WC   insulin  aspart  0-5 Units Subcutaneous QHS   insulin  glargine-yfgn  10 Units Subcutaneous QHS   metoprolol  succinate  50 mg Oral Daily   nystatin    Topical TID   senna-docusate  1 tablet Oral Daily   Continuous Infusions:  DAPTOmycin  700 mg (02/05/23 1455)      LOS: 14 days   Leatrice LILLETTE Chapel  [facility]  02/05/2023, 6:38 PM

## 2023-02-05 NOTE — Progress Notes (Addendum)
 [facility] [place]  Subjective  - No new complaints this am.  Rested well last ngith   Objective 107/61 (!) 57 (!) 97.5 F (36.4 C) (Oral) 11 98%  Intake/Output Summary (Last 24 hours) at 02/05/2023 0804 Last data filed at 02/05/2023 0801 Gross per 24 hour  Intake --  Output 600 ml  Net -600 ml   Left AKA vac to good suction minimal drainage Lungs non labored breathing Right LE stable General no acute distress   Assessment/Planning: 73 y.o. male is s/p  I&D left AKA and wound vac 01/22/2023 and simple partial closure of left AKA and wound vac placement 01/25/2023 both by Dr. Magda and left AKA revision with myriad placement by Dr. Lanis  4 Days Post-Op   The vac will be maintained for 1 week plan to see again Monday.   Stable this am HGB stable 11.1 Will restart ASA   James Dougherty 02/05/2023 8:04 AM --  Laboratory Lab Results: Recent Labs    02/02/23 0827  WBC 12.4*  HGB 11.1*  HCT 35.2*  PLT 336   BMET Recent Labs    02/03/23 0352 02/04/23 0638  NA 136 140  K 4.6 4.6  CL 106 104  CO2 24 27  GLUCOSE 116* 119*  BUN 11 16  CREATININE 1.11 1.10  CALCIUM  7.9* 8.4*    COAG Lab Results  Component Value Date   INR 1.2 12/11/2022   INR 1.1 10/28/2021   INR 1.10 04/05/2017   No results found for: PTT

## 2023-02-05 NOTE — Plan of Care (Signed)
   Problem: Education: Goal: Knowledge of General Education information will improve Description: Including pain rating scale, medication(s)/side effects and non-pharmacologic comfort measures Outcome: Progressing   Problem: Nutrition: Goal: Adequate nutrition will be maintained Outcome: Progressing   Problem: Elimination: Goal: Will not experience complications related to bowel motility Outcome: Progressing

## 2023-02-05 NOTE — Progress Notes (Signed)
 Daily Progress Note   Patient Name: James Dougherty       Date: 02/05/2023 DOB: 1950/09/30  Age: 73 y.o. MRN#: [id number] Attending Physician: Rosario Leatrice FERNS, MD Primary Care Physician: Atilano Deward ORN, MD Admit Date: 01/22/2023  Reason for Consultation/Follow-up: Establishing goals of care  HPI/Patient Profile: 73 y.o. male  with past medical history of DM II, essential hypertension, CVA, right ICA stenosis, CKD stage III, lymphedema and combined chronic systolic and diastolic heart failure.  He was previously discharged from the hospital 12/18/2022 with nonhealing left lower extremity wound.  He underwent left AKA and was subsequently discharged to rehab on 01/18/2023. He was readmitted on 01/22/2023 from SNF with concern for infection in L AKA and sepsis. He underwent I&D and wound vac placement to L AKA 01/22/2023 and simple closure and wound vac change 01/25/23. Collected cx grew MRSA. He underwent L AKA revision and wound vac exchange 02/01/23. Due to his poor prognosis, the patient was referred to palliative care.   Subjective: Pt denies pain at this time, hurts with movement. Feels pain medication works well - no adjustments needed. No complaints.   Length of Stay: 14  Current Medications: Scheduled Meds:   acetaminophen   825 mg Oral TID   aspirin  EC  81 mg Oral Daily   atorvastatin   80 mg Oral QHS   enoxaparin  (LOVENOX ) injection  40 mg Subcutaneous Q24H   gabapentin   600 mg Oral TID   insulin  aspart  0-15 Units Subcutaneous TID WC   insulin  aspart  0-5 Units Subcutaneous QHS   insulin  glargine-yfgn  10 Units Subcutaneous QHS   metoprolol  succinate  50 mg Oral Daily   nystatin    Topical TID   senna-docusate  1 tablet Oral Daily    Continuous Infusions:  DAPTOmycin  700 mg (02/04/23 1410)     PRN Meds: acetaminophen  **OR** acetaminophen , hydrALAZINE , HYDROcodone -acetaminophen , morphine  injection, ondansetron  (ZOFRAN ) IV  Physical Exam Vitals reviewed.  Constitutional:      General: He is not in acute distress.    Appearance: He is not ill-appearing.  Pulmonary:     Effort: Pulmonary effort is normal. No respiratory distress.  Musculoskeletal:     Comments: Wound vac in place  Skin:    General: Skin is warm and dry.  Neurological:     Mental Status: He is alert and oriented to person, place, and time.  Psychiatric:        Mood and Affect: Mood normal.        Behavior: Behavior normal.        Thought Content: Thought content normal.        Judgment: Judgment normal.             Vital Signs: BP 129/71 (BP Location: Right Arm)   Pulse 93   Temp 97.6 F (36.4 C) (Oral)   Resp 14   Ht 5' 8 (1.727 m)   Wt 88.7 kg   SpO2 98%   BMI 29.73 kg/m  SpO2: SpO2: 98 % O2 Device: O2 Device: Room Air O2 Flow Rate: O2 Flow Rate (L/min): 2 L/min  Intake/output summary:  Intake/Output Summary (Last 24 hours) at 02/05/2023 1403 Last data filed at 02/05/2023 0801  Gross per 24 hour  Intake 120 ml  Output 600 ml  Net -480 ml   LBM: Last BM Date : 02/04/23 Baseline Weight: Weight: 91.6 kg Most recent weight: Weight: 88.7 kg       Palliative Assessment/Data: PPS 50%      Patient Active Problem List   Diagnosis Date Noted   Amputation stump infection (HCC) 01/22/2023   Multiple open wounds of left lower extremity 12/11/2022   S/P AKA (above knee amputation) unilateral, left (HCC) 12/11/2022   S/P AKA (above knee amputation) unilateral (HCC) 12/11/2022   Lymphedema 12/03/2021   Neuropathic pain 12/03/2021   TBI (traumatic brain injury) (HCC) 11/04/2021   Subdural hematoma (HCC) 10/28/2021   Gangrene (HCC) 04/26/2017   Cellulitis 04/01/2017   Gangrene of toe (HCC) 04/01/2017   Anemia 04/01/2017   Hyperkalemia 04/01/2017   Chronic kidney disease 04/01/2017    Type II diabetes mellitus with complication (HCC) 04/01/2017   Dry gangrene (HCC) 04/01/2017   Groin pain 07/04/2014   Peripheral vascular disease, unspecified (HCC) 06/27/2013   PAD (peripheral artery disease) (HCC) 12/27/2012   PVD (peripheral vascular disease) (HCC) 12/12/2012   Atherosclerosis of native arteries of the extremities with ulceration (HCC) 03/21/2012   Pain in limb 03/21/2012    Palliative Care Assessment & Plan    Assessment: I have reviewed medical records including EPIC notes, labs and imaging, and met with patient to discuss diagnosis prognosis, GOC, EOL wishes, disposition and options.   Elderly male resting quietly in bed. He awakens to light tactile stimuli. He is in no distress. Patient daughter, Maurilio, contacted on patient's phone to join us  for GOC discussion. We review that earlier today patient completed HCPOA documentation to name her as HCPOA - she was grateful for this and understands role of HCPOA. We reviewed plan of care moving forward; all questions addressed.   I completed a MOST form today. The patient and family outlined their wishes for the following treatment decisions:  Cardiopulmonary Resuscitation: Attempt Resuscitation (CPR)  Medical Interventions: Full Scope of Treatment: Use intubation, advanced airway interventions, mechanical ventilation, cardioversion as indicated, medical treatment, IV fluids, etc, also provide comfort measures. Transfer to the hospital if indicated  Antibiotics: Antibiotics if indicated  IV Fluids: IV fluids if indicated  Feeding Tube: Feeding tube long-term if indicated   Maurilio states the wishes her father has expressed are consistent with previous stated wishes and she agrees with them. Essentially, they are open to any medical care offered to prolong life.   Recommendations/Plan:  Continue current pain management MOST completed as above Full code/full scope Please call if additional palliative needs arise  Goals  of Care and Additional Recommendations: Limitations on Scope of Treatment: Full Scope Treatment  Code Status: Full code  Prognosis:  Unable to determine  Discharge Planning: To Be Determined  Care plan was discussed with patient, daughter  Thank you for allowing the Palliative Medicine Team to assist in the care of this patient.  Total Time: 45 minutes   Time spent includes: Detailed review of medical records (labs, imaging, vital signs), medically appropriate exam, discussion with treatment team, counseling and educating patient, family and/or staff, documenting clinical information, medication management and coordination of care.      Tobey Jama Barnacle, DNP, AGNP-C Palliative Medicine Team Team Phone # [phone]  Pager # [phone]

## 2023-02-05 NOTE — Progress Notes (Signed)
 This chaplain responded to PMT NP-Susan's consult for creating/updating the Pt. Advance Directive:  HCPOA only. The Pt. is not completing a Living Will today.  The Pt. completed AD education with the Chaplain and answered the chaplain's clarifying questions before calling the notary.  The chaplain is present with the Pt., notary, and witnessesses for the notarizing of the Pt. HCPOA. The Pt. named James Dougherty as his healthcare agent.  The chaplain gave the Pt. the original AD along with one copy. The chaplain scanned the Pt. AD into the Pt. EMR.  This chaplain is available for F/U spiritual care as needed.  Chaplain Leeroy Hummer [phone]

## 2023-02-06 DIAGNOSIS — T8740 Infection of amputation stump, unspecified extremity: Secondary | ICD-10-CM | POA: Diagnosis not present

## 2023-02-06 LAB — GLUCOSE, CAPILLARY
Glucose-Capillary: 75 mg/dL (ref 70–99)
Glucose-Capillary: 93 mg/dL (ref 70–99)
Glucose-Capillary: 75 mg/dL (ref 70–99)
Glucose-Capillary: 79 mg/dL (ref 70–99)
Glucose-Capillary: 115 mg/dL — ABNORMAL HIGH (ref 70–99)

## 2023-02-06 NOTE — Progress Notes (Signed)
 [facility] PROGRESS NOTE    Progress Note  James Dougherty  FMW:[id number] DOB: February 05, 1950 DOA: 01/22/2023 PCP: Atilano Deward ORN, MD     Brief Narrative:   James Dougherty is an 73 y.o. male past medical history significant for obesity, diabetes mellitus type 2, essential hypertension stroke critical right ICA stenosis, chronic kidney disease, lymphedema recently discharged from the hospital on 12/18/2022 for nonhealing left lower extremity wound, underwent left AKA and subsequently discharged to rehab on 01/18/2023.  Patient was seen by vascular surgery at the office and it was noted that the left BKA was draining and the lateral aspect of the stump was concerning for infection, however, patient remained afebrile, no leukocytosis and asymptomatic.  Close follow-up was recommended.  On 01/22/2023, patient was sent from skilled due to draining of the stump and low-grade fever.  Patient was started empirically on IV antibiotics on admission.  Now de-escalated to IV daptomycin .  Patient is deemed to have guarded prognosis.  Palliative care team has been consulted.  H  02/06/2023: Patient seen.  Patient is very hard of hearing.  No new complaints.   Assessment/Plan:  Amputation stump infection (HCC) Status post I&D of AKA and wound VAC on 01/22/2023 and simple partial closure of left AKA wound VAC placement on 01/25/2023. Intraoperative cultures on 01/22/2023 grew MRSA. ID was consulted recommended IV daptomycin . Vascular surgery recommended left above-knee amputation revision and wound VAC exchange on 02/01/2023. PT evaluated the patient, will need skilled nursing facility placement. Lovenox  aspirin  and Plavix  were held due to bleeding from stump.  Wound VAC has been discontinued. Orthopedic surgery to dictate when to re-start aspirin , Plavix . Restart Lovenox  for DVT prophylaxis. 02/06/2023: Pursue disposition: Once cleared for discharge by the surgical team  Peripheral vascular  disease, unspecified (HCC)/status post left AKA on 12/11/2022. Aspirin  and Plavix  were held due to bleeding from stump, continue statins. He will need angiogram of the left AKA as an outpatient on 02/10/2023.  Chronic combined systolic and diastolic heart failure/essential hypertension: Currently on metoprolol . Lasix  and Jardiance  are on hold for surgical procedure.  Diabetes mellitus type 2 uncontrolled with peripheral neuropathy: With an A1c of 8.4. Continue long-acting insulin  plus sliding scale. Continue Cymbalta  and Neurontin .  History of CVA/right ICA stenosis/hyperlipidemia: Continue statins.  Hold aspirin  and Plavix  as well as Lovenox .  Chronic kidney disease stage IIIb: His creatinine is at baseline.  Acute blood loss anemia: Lovenox  was held along with aspirin  and Plavix . Hemoglobin has remained stable this morning continue to monitor intermittently.  Obesity: Counseling.  Tinea Cruris: Cont miconazole  powder.  Constipation: Had multiple bowel movements he feels better. Continue MiraLAX  twice a day.  Coccygeal pressure ulcer unstageable present on admission: RN Pressure Injury Documentation: Pressure Injury 11/04/21 Coccyx Mid Deep Tissue Pressure Injury - Purple or maroon localized area of discolored intact skin or blood-filled blister due to damage of underlying soft tissue from pressure and/or shear. non-blanchable purple area surrounded  (Active)  11/04/21 1634  Location: Coccyx  Location Orientation: Mid  Staging: Deep Tissue Pressure Injury - Purple or maroon localized area of discolored intact skin or blood-filled blister due to damage of underlying soft tissue from pressure and/or shear.  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  (Active)  12/11/22 1915  Location: Coccyx  Location Orientation:   Staging: Stage 2 -  Partial thickness loss of dermis presenting as a shallow open injury with a red, pink wound bed without slough.  Wound Description (Comments):   Present on Admission: Yes  Dressing Type Foam - Lift dressing to assess site every shift 02/06/23 0757    Estimated body mass index is 29.73 kg/m as calculated from the following:   Height as of this encounter: 5' 8 (1.727 m).   Weight as of this encounter: 88.7 kg.   DVT prophylaxis: None Family Communication:none Status is: Inpatient Remains inpatient appropriate because: Stump infection    Code Status:     Code Status Orders  (From admission, onward)           Start     Ordered   01/22/23 1639  Full code  Continuous       Question:  By:  Answer:  Consent: discussion documented in EHR   01/22/23 1640           Code Status History     Date Active Date Inactive Code Status Order ID Comments User Context   12/11/2022 1522 12/18/2022 1906 Full Code [id number]  Vernon Ranks, MD Inpatient   09/30/2022 1153 09/30/2022 1943 Full Code [id number]  Lanis Fonda BRAVO, MD Inpatient   11/04/2021 1600 11/14/2021 1944 Full Code [id number]  Maurice Sharlet RAMAN, PA-C Inpatient   10/28/2021 1953 11/04/2021 1545 Full Code [id number]  Sebastian Moles, MD ED   04/19/2018 1017 04/19/2018 1744 Full Code [id number]  Serene Gaile ORN, MD Inpatient   04/26/2017 1848 04/28/2017 1826 Full Code [id number]  Bethanie Donnice RIGGERS Inpatient   04/01/2017 2231 04/06/2017 2009 Full Code [id number]  Luke Agent, MD Inpatient      Advance Directive Documentation    Flowsheet Row Most Recent Value  Type of Advance Directive Healthcare Power of Attorney  Pre-existing out of facility DNR order (yellow form or pink MOST form) --  MOST Form in Place? --         IV Access:   Peripheral IV   Procedures and diagnostic studies:   No results found.   Medical Consultants:    None.   Subjective:    James Dougherty pain is controlled.  Objective:    Vitals:   02/05/23 2128 02/06/23 0006 02/06/23 0750 02/06/23 0800  BP:  (!) 118/55 131/74   Pulse: 86 60 75 72  Resp: 20 18 16 15   Temp:  (!) 97.5 F (36.4 C) 97.7 F (36.5 C)   TempSrc:  Oral Oral   SpO2: 100% 97% 100% 100%  Weight:      Height:       SpO2: 100 % O2 Flow Rate (L/min): 2 L/min   Intake/Output Summary (Last 24 hours) at 02/06/2023 1005 Last data filed at 02/06/2023 0757 Gross per 24 hour  Intake --  Output 0 ml  Net 0 ml   Filed Weights   02/02/23 2215 02/05/23 0256 02/05/23 0758  Weight: 84.5 kg 84.4 kg 88.7 kg    Exam: General exam: Patient is very hard of hearing.  Awake and alert.  Not in any distress.  Patient is pale. Respiratory system: Clear to auscultation. Cardiovascular system: S1 & S2, soft systolic murmur.  Gastrointestinal system: Abdomen is soft and nontender.  Extremities: Status post left AKA, with wound VAC.  Status post amputation of first and second right toes.   Labs: Basic Metabolic Panel: Recent Labs  Lab 02/01/23 0459 02/02/23 0827  02/03/23 0352 02/04/23 0638  NA 139 136 136 140  K 4.5 5.1 4.6 4.6  CL 108 102 106 104  CO2 23 25 24 27   GLUCOSE 93 202* 116* 119*  BUN 11 10 11 16   CREATININE 0.99 1.00 1.11 1.10  CALCIUM  8.0* 8.3* 7.9* 8.4*   GFR Estimated Creatinine Clearance: 65.7 mL/min (by C-G formula based on SCr of 1.1 mg/dL). Liver Function Tests: No results for input(s): AST, ALT, ALKPHOS, BILITOT, PROT, ALBUMIN  in the last 168 hours.  No results for input(s): LIPASE, AMYLASE in the last 168 hours. No results for input(s): AMMONIA in the last 168 hours. Coagulation profile No results for input(s): INR, PROTIME in the last 168 hours. COVID-19 Labs  No results for input(s): DDIMER, FERRITIN, LDH, CRP in the last 72 hours.  Lab Results  Component Value Date   SARSCOV2NAA NEGATIVE 10/28/2021     CBC: Recent Labs  Lab 02/01/23 0459 02/02/23 0827  WBC 7.8 12.4*  NEUTROABS 3.9  --   HGB 11.0* 11.1*  HCT 35.0* 35.2*  MCV 99.7 99.7  PLT 309 336   Cardiac Enzymes: Recent Labs  Lab 02/04/23 0638 02/05/23 0330  CKTOTAL 474* 224   BNP (last 3 results) No results for input(s): PROBNP in the last 8760 hours. CBG: Recent Labs  Lab 02/05/23 0634 02/05/23 1121 02/05/23 1556 02/05/23 2110 02/06/23 0703  GLUCAP 93 123* 127* 106* 75   D-Dimer: No results for input(s): DDIMER in the last 72 hours. Hgb A1c: No results for input(s): HGBA1C in the last 72 hours. Lipid Profile: No results for input(s): CHOL, HDL, LDLCALC, TRIG, CHOLHDL, LDLDIRECT in the last 72 hours. Thyroid  function studies: No results for input(s): TSH, T4TOTAL, T3FREE, THYROIDAB in the last 72 hours.  Invalid input(s): FREET3 Anemia work up: No results for input(s): VITAMINB12, FOLATE, FERRITIN, TIBC, IRON, RETICCTPCT in the last 72 hours. Sepsis Labs: Recent Labs  Lab 02/01/23 0459 02/02/23 0827  WBC 7.8 12.4*   Microbiology Recent Results (from the past 240 hours)  MIC (1 Drug)-Wound culture; 01/22/2023; Wound; MRSA / Staph aureus; Daptomycin ; Patient immune status: Normal     Status: Abnormal   Collection Time: 01/28/23  9:06 AM   Specimen: Wound  Result Value Ref Range Status   Min Inhibitory Conc (1 Drug) Final report (A)  Corrected    Comment: (NOTE) Performed At: [facility] [address] [place], [place] [id number] Jennette Shorter MD Ey:[phone] CORRECTED ON 01/06 AT 0735: PREVIOUSLY REPORTED AS Preliminary report    Source WOUND  Final    Comment: LEFT KNEE AMPUTATION Performed at [facility], [address] [place], [place] [address]      Medications:    acetaminophen   825 mg Oral TID   aspirin  EC  81 mg Oral Daily   atorvastatin   80 mg Oral QHS   enoxaparin  (LOVENOX ) injection  40 mg Subcutaneous Q24H    gabapentin   600 mg Oral TID   insulin  aspart  0-15 Units Subcutaneous TID WC   insulin  aspart  0-5 Units Subcutaneous QHS   insulin  glargine-yfgn  10 Units Subcutaneous QHS   metoprolol  succinate  50 mg Oral Daily   nystatin    Topical TID   senna-docusate  1 tablet Oral Daily   Continuous Infusions:  DAPTOmycin  700 mg (02/05/23 1455)      LOS: 15 days   Leatrice LILLETTE Chapel  [facility]  02/06/2023, 10:05 AM

## 2023-02-06 NOTE — Plan of Care (Signed)

## 2023-02-07 DIAGNOSIS — T8740 Infection of amputation stump, unspecified extremity: Secondary | ICD-10-CM | POA: Diagnosis not present

## 2023-02-07 LAB — GLUCOSE, CAPILLARY
Glucose-Capillary: 117 mg/dL — ABNORMAL HIGH (ref 70–99)
Glucose-Capillary: 87 mg/dL (ref 70–99)
Glucose-Capillary: 102 mg/dL — ABNORMAL HIGH (ref 70–99)
Glucose-Capillary: 164 mg/dL — ABNORMAL HIGH (ref 70–99)
Glucose-Capillary: 99 mg/dL (ref 70–99)

## 2023-02-07 NOTE — Plan of Care (Signed)

## 2023-02-07 NOTE — Progress Notes (Addendum)
 [facility] PROGRESS NOTE    Progress Note  James Dougherty  FMW:[id number] DOB: 17-Apr-1950 DOA: 01/22/2023 PCP: Atilano Deward ORN, MD     Brief Narrative:   Patient is a 73 year old male, with past medical history significant for obesity, diabetes mellitus type 2, essential hypertension, stroke with critical right ICA stenosis, chronic kidney disease, and lymphedema.  Patient was recently discharged from the hospital on 12/18/2022 for nonhealing left lower extremity wound, underwent left AKA and subsequently discharged to rehab on 01/18/2023.  Patient was seen by vascular surgery at the office and it was noted that the left BKA was draining and the lateral aspect of the stump was concerning for infection, however, patient remained afebrile, no leukocytosis and asymptomatic.  Close follow-up was recommended.  On 01/22/2023, patient was admitted from the SNF due to draining of the stump and low-grade fever.  Patient was started empirically on IV antibiotics on admission, now de-escalated to IV daptomycin  (wound culture grew MRSA).  Patient is deemed to have guarded prognosis.  Palliative care team has been consulted.    02/07/2023: Patient seen.  Patient is very hard of hearing.  No new complaints.   Assessment/Plan:  Amputation stump infection (HCC): -Status post I&D of AKA and wound VAC on 01/22/2023 and simple partial closure of left AKA wound VAC placement on 01/25/2023, and direct placement 4 days post op.  Surgery team is directing. -Intraoperative cultures on 01/22/2023 grew MRSA. -ID was consulted recommended IV daptomycin . -PT evaluated the patient, will need skilled nursing facility placement.  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 -Patient is on aspirin  and atorvastatin .   -Plavix  is on hold due to bleeding from stump. -He will need angiogram of the left AKA as an outpatient on 02/10/2023.  Chronic combined systolic and diastolic  heart failure/essential hypertension: -Currently on metoprolol . -Lasix  and Jardiance  are on hold for surgical procedure.  Diabetes mellitus type 2 uncontrolled with peripheral neuropathy: A1c of 8.4 (12/11/2022). Continue long-acting insulin  plus sliding scale.  History of CVA/right ICA stenosis/hyperlipidemia: Continue statins and aspirin . Plavix  is on hold.  Acute kidney injury versus chronic kidney disease stage: -Last serum creatinine was 1.1.    Acute blood loss anemia: -No further bleeding noted. -Last hemoglobin was 11.1 g/dL.    Obesity: Counseling.  Tinea Cruris: Cont miconazole  powder.  Constipation: Had multiple bowel movements he feels better. Continue MiraLAX  twice a day.  Coccygeal pressure ulcer unstageable present on admission: RN Pressure Injury Documentation: Pressure Injury 11/04/21 Coccyx Mid Deep Tissue Pressure Injury - Purple or maroon localized area of discolored intact skin or blood-filled blister due to damage of underlying soft tissue from pressure and/or shear. non-blanchable purple area surrounded  (Active)  11/04/21 1634  Location: Coccyx  Location Orientation: Mid  Staging: Deep Tissue Pressure Injury - Purple or maroon localized area of discolored intact skin or blood-filled blister due to damage of underlying soft tissue from pressure and/or shear.  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 (Active)  12/11/22 1915  Location: Coccyx  Location Orientation:   Staging: Stage 2 -  Partial thickness loss of dermis presenting as a shallow open injury with a red, pink wound bed without slough.  Wound Description (Comments):   Present on Admission: Yes  Dressing Type Foam - Lift dressing to assess site every shift  02/07/23 0826    Estimated body mass index is 29.73 kg/m as calculated from the following:   Height as of this encounter: 5' 8 (1.727 m).   Weight as of this encounter: 88.7 kg.   DVT prophylaxis: None Family Communication:none Status is: Inpatient Remains inpatient appropriate because: Stump infection    Code Status:     Code Status Orders  (From admission, onward)           Start     Ordered   01/22/23 1639  Full code  Continuous       Question:  By:  Answer:  Consent: discussion documented in EHR   01/22/23 1640           Code Status History     Date Active Date Inactive Code Status Order ID Comments User Context   12/11/2022 1522 12/18/2022 1906 Full Code [id number]  Vernon Ranks, MD Inpatient   09/30/2022 1153 09/30/2022 1943 Full Code [id number]  Lanis Fonda BRAVO, MD Inpatient   11/04/2021 1600 11/14/2021 1944 Full Code [id number]  Maurice Sharlet RAMAN, PA-C Inpatient   10/28/2021 1953 11/04/2021 1545 Full Code [id number]  Sebastian Moles, MD ED   04/19/2018 1017 04/19/2018 1744 Full Code [id number]  Serene Gaile ORN, MD Inpatient   04/26/2017 1848 04/28/2017 1826 Full Code [id number]  Bethanie Donnice RIGGERS Inpatient   04/01/2017 2231 04/06/2017 2009 Full Code [id number]  Luke Agent, MD Inpatient      Advance Directive Documentation    Flowsheet Row Most Recent Value  Type of Advance Directive Healthcare Power of Attorney  Pre-existing out of facility DNR order (yellow form or pink MOST form) --  MOST Form in Place? --         IV Access:   Peripheral IV   Procedures and diagnostic studies:   No results found.   Medical Consultants:   None.   Subjective:    Tyronn Golda Roston pain is controlled.  Objective:    Vitals:   02/07/23 0900 02/07/23 1142 02/07/23 1200 02/07/23 1215  BP:  (!) 111/45  (!) 116/50  Pulse: 63 64  65  Resp: 14 15 16 19   Temp:  98.1 F (36.7 C)    TempSrc:  Oral    SpO2: 98% 100%  99%  Weight:      Height:       SpO2: 99  % O2 Flow Rate (L/min): 2 L/min   Intake/Output Summary (Last 24 hours) at 02/07/2023 1329 Last data filed at 02/07/2023 1215 Gross per 24 hour  Intake 1040 ml  Output 1950 ml  Net -910 ml   Filed Weights   02/02/23 2215 02/05/23 0256 02/05/23 0758  Weight: 84.5 kg 84.4 kg 88.7 kg    Exam: General exam: Patient is very hard of hearing.  Awake and alert.  Not in any distress.  Patient is pale. Respiratory system: Clear to auscultation. Cardiovascular system: S1 & S2, soft systolic murmur.  Gastrointestinal system: Abdomen is soft and nontender.  Extremities: Status post left AKA, with wound VAC.  Status post amputation of first and second right toes.   Labs: Basic Metabolic Panel: Recent Labs  Lab 02/01/23 0459 02/02/23 0827 02/03/23 0352 02/04/23 0638  NA 139 136 136 140  K 4.5 5.1 4.6 4.6  CL 108 102 106 104  CO2 23 25 24 27   GLUCOSE 93 202* 116* 119*  BUN 11 10 11 16   CREATININE 0.99  1.00 1.11 1.10  CALCIUM  8.0* 8.3* 7.9* 8.4*   GFR Estimated Creatinine Clearance: 65.7 mL/min (by C-G formula based on SCr of 1.1 mg/dL). Liver Function Tests: No results for input(s): AST, ALT, ALKPHOS, BILITOT, PROT, ALBUMIN  in the last 168 hours.  No results for input(s): LIPASE, AMYLASE in the last 168 hours. No results for input(s): AMMONIA in the last 168 hours. Coagulation profile No results for input(s): INR, PROTIME in the last 168 hours. COVID-19 Labs  No results for input(s): DDIMER, FERRITIN, LDH, CRP in the last 72 hours.  Lab Results  Component Value Date   SARSCOV2NAA NEGATIVE 10/28/2021    CBC: Recent Labs  Lab 02/01/23 0459 02/02/23 0827  WBC 7.8 12.4*  NEUTROABS 3.9  --   HGB 11.0* 11.1*  HCT 35.0* 35.2*  MCV 99.7 99.7  PLT 309 336   Cardiac Enzymes: Recent Labs  Lab 02/04/23 0638 02/05/23 0330  CKTOTAL 474* 224   BNP (last 3 results) No results for input(s): PROBNP in the last 8760 hours. CBG: Recent Labs   Lab 02/06/23 1749 02/06/23 2134 02/06/23 2211 02/07/23 0609 02/07/23 1141  GLUCAP 79 117* 115* 87 102*   D-Dimer: No results for input(s): DDIMER in the last 72 hours. Hgb A1c: No results for input(s): HGBA1C in the last 72 hours. Lipid Profile: No results for input(s): CHOL, HDL, LDLCALC, TRIG, CHOLHDL, LDLDIRECT in the last 72 hours. Thyroid  function studies: No results for input(s): TSH, T4TOTAL, T3FREE, THYROIDAB in the last 72 hours.  Invalid input(s): FREET3 Anemia work up: No results for input(s): VITAMINB12, FOLATE, FERRITIN, TIBC, IRON, RETICCTPCT in the last 72 hours. Sepsis Labs: Recent Labs  Lab 02/01/23 0459 02/02/23 0827  WBC 7.8 12.4*   Microbiology No results found for this or any previous visit (from the past 240 hours).    Medications:    acetaminophen   825 mg Oral TID   aspirin  EC  81 mg Oral Daily   atorvastatin   80 mg Oral QHS   enoxaparin  (LOVENOX ) injection  40 mg Subcutaneous Q24H   gabapentin   600 mg Oral TID   insulin  aspart  0-15 Units Subcutaneous TID WC   insulin  aspart  0-5 Units Subcutaneous QHS   insulin  glargine-yfgn  10 Units Subcutaneous QHS   metoprolol  succinate  50 mg Oral Daily   nystatin    Topical TID   senna-docusate  1 tablet Oral Daily   Continuous Infusions:  DAPTOmycin  Stopped (02/06/23 1418)      LOS: 16 days   Leatrice LILLETTE Chapel  [facility]  02/07/2023, 1:29 PM

## 2023-02-08 ENCOUNTER — Ambulatory Visit: Payer: Medicare Other | Admitting: Nurse Practitioner

## 2023-02-08 DIAGNOSIS — Z515 Encounter for palliative care: Secondary | ICD-10-CM

## 2023-02-08 DIAGNOSIS — T8740 Infection of amputation stump, unspecified extremity: Secondary | ICD-10-CM | POA: Diagnosis not present

## 2023-02-08 DIAGNOSIS — Z7189 Other specified counseling: Secondary | ICD-10-CM

## 2023-02-08 LAB — GLUCOSE, CAPILLARY
Glucose-Capillary: 108 mg/dL — ABNORMAL HIGH (ref 70–99)
Glucose-Capillary: 103 mg/dL — ABNORMAL HIGH (ref 70–99)
Glucose-Capillary: 124 mg/dL — ABNORMAL HIGH (ref 70–99)
Glucose-Capillary: 98 mg/dL (ref 70–99)

## 2023-02-08 LAB — CK: Total CK: 58 U/L (ref 49–397)

## 2023-02-08 MED ORDER — MORPHINE SULFATE (PF) 2 MG/ML IV SOLN
1.0000 mg | Freq: Once | INTRAVENOUS | Status: AC
  Administered 2023-02-08: 1 mg via INTRAVENOUS

## 2023-02-08 MED ORDER — PHENYLEPHRINE HCL-NACL 20-0.9 MG/250ML-% IV SOLN
INTRAVENOUS | Status: AC
  Filled 2023-02-08: qty 250

## 2023-02-08 MED ORDER — MORPHINE 100MG IN NS 100ML (1MG/ML) PREMIX INFUSION: 1.0000 mg/h | Freq: Once | INTRAVENOUS | Status: DC

## 2023-02-08 NOTE — Progress Notes (Signed)
 Physical Therapy Progress Note Patient Details Name: James Dougherty MRN: [id number] DOB: Aug 03, 1950 Today's Date: 02/08/2023   History of Present Illness 73 y.o. male presents to [facility] 01/22/23 from [facility] rehab for post-op evaluation of recent L AKA (11/15) w/ concern for infection and severe sepsis. S/p I&D of L AKA and application of wound vac 12/27. s/p I&D and wound vac change 12/30. s/p wound washout and revision of AKA 1/6. New Wound vac placed 1/12. Past medical history of arthritis, hyperlipidemia, hypertension, neuropathic pain, PAD, psoriasis, stroke, diabetes, CKD 3B, amputation R great toe 2019, R ICA stenosis    PT Comments  Pt is making very slow progress towards goals. Pt is limited by pain. Attempted seeing pt after pre-medication but pt was unable to progress to out of bed activities. Pt sat EOB ~8 min with heavy UE support initially with increased time to get pt to fully WB through L hip.  Pt is a Max A for rolling and total A for supine<>sitting EOB. Due to pt current functional status, home set up and available assistance at home recommending skilled physical therapy services < 3 hours/day in order to address strength, balance and functional mobility to decrease risk for falls, injury, immobility, skin break down and re-hospitalization.     If plan is discharge home, recommend the following: Assist for transportation;Help with stairs or ramp for entrance;Two people to help with walking and/or transfers;Assistance with cooking/housework   Can travel by private vehicle     No  Equipment Recommendations  Other (comment) (defer to post acute)       Precautions / Restrictions Precautions Precautions: Fall Precaution Comments: L AKA Restrictions Weight Bearing Restrictions Per Provider Order: Yes Other Position/Activity Restrictions: wound vac     Mobility  Bed Mobility Overal bed mobility: Needs Assistance Bed Mobility: Supine to Sit, Sit to Supine Rolling: Used rails, Max  assist Sidelying to sit: Max assist, Total assist   Sit to supine: Max assist, Total assist   General bed mobility comments: Pt able to reach across to grasp rail on the R using L hand pt then gripping rail extremely tight stating he is pushing up when asked to push trunk to mid line. Pt actively was preventing himself from getting to mid line. Used HOB up to 90 to get pt trunk to mid line. Pt hand had to be taken off of the bed rail. Pt was then asked to hold himself in mid line. Pt required verbal cues to initiate removing fists from bed including LUE pushing pt toward the R. Pt moaning loudly. Pt was premedicated prior to physical therapy. Pt initially putting no wgt through L Hip. Once bil UE facilitated to relax pt was able to WB through LLE. Asked to extend the R knee in sitting and pt laid down on the R side. When asked what he was doing pt just stated  I don't know. Terminated session at this time. VS WNL    Transfers     General transfer comment: unable to progress today due to pain and pt cognitive status.    Ambulation/Gait     General Gait Details: not able      Balance Overall balance assessment: Needs assistance Sitting-balance support: No upper extremity supported, Feet supported Sitting balance-Leahy Scale: Poor Sitting balance - Comments: leaning to the R Max A to get bil UE to relax to fully sit on EOB> Postural control: Posterior lean, Right lateral lean        Cognition  Arousal: Alert Behavior During Therapy: Anxious Overall Cognitive Status: No family/caregiver present to determine baseline cognitive functioning         General Comments: Very HOH, needs lot of encouragement           General Comments General comments (skin integrity, edema, etc.): Pt on room air O2 sats in the 90's, HR between 90-100. Pt was cued for slowing down breathing due to increased respirations sitting EOB.      Pertinent Vitals/Pain Pain Assessment Pain Assessment:  Faces Faces Pain Scale: Hurts whole lot Pain Location: L residual limb Pain Descriptors / Indicators: Moaning, Grimacing, Discomfort, Aching Pain Intervention(s): Monitored during session, Premedicated before session, Limited activity within patient's tolerance     PT Goals (current goals can now be found in the care plan section) Acute Rehab PT Goals Patient Stated Goal: to get stronger PT Goal Formulation: With patient Time For Goal Achievement: 02/22/23 Potential to Achieve Goals: Fair Progress towards PT goals: Progressing toward goals    Frequency    Min 1X/week      PT Plan  Continue with current POC        AM-PAC PT 6 Clicks Mobility   Outcome Measure  Help needed turning from your back to your side while in a flat bed without using bedrails?: A Lot Help needed moving from lying on your back to sitting on the side of a flat bed without using bedrails?: A Lot Help needed moving to and from a bed to a chair (including a wheelchair)?: Total Help needed standing up from a chair using your arms (e.g., wheelchair or bedside chair)?: Total Help needed to walk in hospital room?: Total Help needed climbing 3-5 steps with a railing? : Total 6 Click Score: 8    End of Session Equipment Utilized During Treatment: Gait belt Activity Tolerance: Patient limited by pain Patient left: in bed;with call bell/phone within reach;with bed alarm set Nurse Communication: Mobility status PT Visit Diagnosis: Muscle weakness (generalized) (M62.81);Other abnormalities of gait and mobility (R26.89)     Time: 1510-1533 PT Time Calculation (min) (ACUTE ONLY): 23 min  Charges:    $Therapeutic Activity: 23-37 mins PT General Charges $$ ACUTE PT VISIT: 1 Visit                     Dorothyann Maier, DPT, CLT  Acute Rehabilitation Services Office: [phone] (Secure chat preferred)    Dorothyann VEAR Maier 02/08/2023, 5:09 PM

## 2023-02-08 NOTE — Progress Notes (Signed)
   Palliative Medicine Inpatient Follow Up Note HPI: 73 y.o. male with past medical history of DM II, essential hypertension, CVA, right ICA stenosis, CKD stage III, lymphedema and combined chronic systolic and diastolic heart failure. He was previously discharged from the hospital 12/18/2022 with nonhealing left lower extremity wound. He underwent left AKA and was subsequently discharged to rehab on 01/18/2023. He was readmitted on 01/22/2023 from SNF with concern for infection in L AKA and sepsis. He underwent I&D and wound vac placement to L AKA 01/22/2023 and simple closure and wound vac change 01/25/23. Collected cx grew MRSA. He underwent L AKA revision and wound vac exchange 02/01/23. Due to his poor prognosis, the patient was referred to palliative care.   Today's Discussion 02/08/2023  *Please note that this is a verbal dictation therefore any spelling or grammatical errors are due to the Dragon Medical One system interpretation.  Chart reviewed inclusive of vital signs, progress notes, laboratory results, and diagnostic images.   James Dougherty, APP and I met with James Dougherty at bedside. A review of his current clinical condition was held. Created space and opportunity for patient to explore thoughts feelings and fears regarding current medical situation. He shares that he had just taken some pain medications for his leg pain, these do cause relief of symptoms.   James Dougherty confirmed his goals for his present care, full code, and the plan for skilled nursing after acute hospitalization.   James Dougherty requests some help ordering lunch, a menu was obtained and lunch called in.   Questions and concerns addressed/Palliative Support Provided.   Objective Assessment: Vital Signs Vitals:   02/08/23 0907 02/08/23 1453  BP: 134/88 (!) 112/51  Pulse: 65 81  Resp: 10 20  Temp:    SpO2: 100% 96%    Intake/Output Summary (Last 24 hours) at 02/08/2023 1552 Last data filed at 02/08/2023 9386 Gross per 24 hour   Intake 100 ml  Output 900 ml  Net -800 ml   Last Weight  Most recent update: 02/05/2023  7:59 AM    Weight  88.7 kg (195 lb 8.8 oz)            Gen:  Elderly Caucasian M chronically ill appearing HEENT: moist mucous membranes CV: Regular rate and rhythm  PULM: On RA, breathing is even and nonlabored ABD: soft/nontender  EXT: L AKA with wound vac on Neuro: Alert and oriented x3  SUMMARY OF RECOMMENDATIONS   Full Code / Full Scope of Care  Allow time for outcomes  Plan for skilled nursing placement on discharge  Ongoing incremental Palliative care support  Time Spent:25 ______________________________________________________________________________________ James Dougherty Palliative Medicine Team Team Cell Phone: [phone] Please utilize secure chat with additional questions, if there is no response within 30 minutes please call the above phone number  Palliative Medicine Team providers are available by phone from 7am to 7pm daily and can be reached through the team cell phone.  Should this patient require assistance outside of these hours, please call the patient's attending physician.

## 2023-02-08 NOTE — Progress Notes (Signed)
 PROGRESS NOTE    James Dougherty  FMW:[id number] DOB: Mar 26, 1950 DOA: 01/22/2023 PCP: Atilano Deward ORN, MD    Brief Narrative:  73 year old with history of obesity, type 2 diabetes on insulin , hypertension, history of stroke, CKD, lymphedema.  Recent multiple hospitalizations.  Currently at long-term nursing home since September 2024.  Recent admission for left extremity wound, with below knee amputation.  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  Wound culture with MRSA.  Underwent above-knee amputation and wound VAC placement 12/27.  Remains in the hospital with poor pain control and ongoing need for wound care.  Subjective: Patient seen and examined.  He just had wound VAC changed by vascular surgery and was quite distressed.  Improved with morphine  injection.  No other overnight events. Assessment & Plan:   Severe peripheral vascular disease, diabetic and vascular insufficiency ulcer with left foot infection: Multiple procedures and ultimately left above-knee amputation. Blood cultures negative. Wound cultures with MRSA from 12/27. On different antibiotics but now remains on daptomycin .  ID recommended to continue daptomycin  for 6 weeks. Patient is on aspirin  and atorvastatin .  Plavix  on hold. Adequate pain medications.  He still requiring significant pain medications with wound VAC changes.  Chronic combined heart failure/hypertension: Currently stable on metoprolol .  Type 2 diabetes, uncontrolled with peripheral neuropathy: A1c 8.4.  On long-acting and short acting insulin .  Multiple chronic issues including Anemia of chronic disease, stable History of stroke and hyperlipidemia: Stable.  Currently on aspirin  and statins. Constipation, improved with MiraLAX .    DVT prophylaxis: enoxaparin  (LOVENOX ) injection 40 mg Start: 02/02/23 1215   Code Status: Full code Family Communication: None Disposition Plan: Status is:  Inpatient Remains inpatient appropriate because: Surgically active treatment.     Consultants:  Vascular surgery Infectious disease  Procedures:  Multiple left leg procedures  Antimicrobials:  Daptomycin      Objective: Vitals:   02/07/23 2333 02/07/23 2335 02/08/23 0418 02/08/23 0907  BP: (!) 122/47 (!) 109/49 (!) 114/57 134/88  Pulse:  81 100 65  Resp:  12 13 10   Temp:  98.7 F (37.1 C) 98.6 F (37 C)   TempSrc:  Oral Oral   SpO2:  97% 96% 100%  Weight:      Height:        Intake/Output Summary (Last 24 hours) at 02/08/2023 1329 Last data filed at 02/08/2023 9386 Gross per 24 hour  Intake 100 ml  Output 900 ml  Net -800 ml   Filed Weights   02/02/23 2215 02/05/23 0256 02/05/23 0758  Weight: 84.5 kg 84.4 kg 88.7 kg    Examination:  General exam: Appears anxious after the procedure.  On room air. Frail and debilitated.  Chronically sick looking. Respiratory system: Clear to auscultation. Respiratory effort normal. Cardiovascular system: S1 & S2 heard, RRR.  Gastrointestinal system: Soft and nontender.  Obese. Central nervous system: Alert and oriented. No focal neurological deficits.  Gross generalized weakness. Extremities: Symmetric 5 x 5 power. Skin:  Left AKA stump with thin serosanguineous drainage on the wound VAC.    Data Reviewed: I have personally reviewed following labs and imaging studies  CBC: Recent Labs  Lab 02/02/23 0827  WBC 12.4*  HGB 11.1*  HCT 35.2*  MCV 99.7  PLT 336   Basic Metabolic Panel: Recent Labs  Lab 02/02/23 0827 02/03/23 0352 02/04/23 0638  NA 136 136 140  K 5.1 4.6 4.6  CL 102 106 104  CO2 25 24 27  GLUCOSE 202* 116* 119*  BUN 10 11 16   CREATININE 1.00 1.11 1.10  CALCIUM  8.3* 7.9* 8.4*   GFR: Estimated Creatinine Clearance: 65.7 mL/min (by C-G formula based on SCr of 1.1 mg/dL). Liver Function Tests: No results for input(s): AST, ALT, ALKPHOS, BILITOT, PROT, ALBUMIN  in the last 168  hours. No results for input(s): LIPASE, AMYLASE in the last 168 hours. No results for input(s): AMMONIA in the last 168 hours. Coagulation Profile: No results for input(s): INR, PROTIME in the last 168 hours. Cardiac Enzymes: Recent Labs  Lab 02/04/23 0638 02/05/23 0330 02/08/23 0720  CKTOTAL 474* 224 58   BNP (last 3 results) No results for input(s): PROBNP in the last 8760 hours. HbA1C: No results for input(s): HGBA1C in the last 72 hours. CBG: Recent Labs  Lab 02/07/23 1141 02/07/23 1618 02/07/23 2127 02/08/23 0611 02/08/23 1122  GLUCAP 102* 164* 99 108* 103*   Lipid Profile: No results for input(s): CHOL, HDL, LDLCALC, TRIG, CHOLHDL, LDLDIRECT in the last 72 hours. Thyroid  Function Tests: No results for input(s): TSH, T4TOTAL, FREET4, T3FREE, THYROIDAB in the last 72 hours. Anemia Panel: No results for input(s): VITAMINB12, FOLATE, FERRITIN, TIBC, IRON, RETICCTPCT in the last 72 hours. Sepsis Labs: No results for input(s): PROCALCITON, LATICACIDVEN in the last 168 hours.  No results found for this or any previous visit (from the past 240 hours).       Radiology Studies: No results found.      Scheduled Meds:  acetaminophen   825 mg Oral TID   aspirin  EC  81 mg Oral Daily   atorvastatin   80 mg Oral QHS   enoxaparin  (LOVENOX ) injection  40 mg Subcutaneous Q24H   gabapentin   600 mg Oral TID   insulin  aspart  0-15 Units Subcutaneous TID WC   insulin  aspart  0-5 Units Subcutaneous QHS   insulin  glargine-yfgn  10 Units Subcutaneous QHS   metoprolol  succinate  50 mg Oral Daily   nystatin    Topical TID   senna-docusate  1 tablet Oral Daily   Continuous Infusions:  DAPTOmycin  Stopped (02/07/23 1432)     LOS: 17 days    Time spent: 40 minutes    Renato Applebaum, MD [facility]

## 2023-02-08 NOTE — Progress Notes (Addendum)
 [facility] [place]  Subjective  - He rested well.  He is worried about the vac change and wants to be asleep if possible for the next change.     Objective (!) 114/57 100 98.6 F (37 C) (Oral) 13 96%  Intake/Output Summary (Last 24 hours) at 02/08/2023 0729 Last data filed at 02/08/2023 9386 Gross per 24 hour  Intake 1140 ml  Output 1300 ml  Net -160 ml   Vac to good suction left LE Right LE motor intact and is warm to touch General no acute distress Lungs non labored breathing   Assessment/Planning: 73 y.o. male is s/p  I&D left AKA and wound vac 01/22/2023 and simple partial closure of left AKA and wound vac placement 01/25/2023 both by Dr. Magda and left AKA revision with myriad placement by Dr. Lanis   Last vac change 02/01/23 pending next vac change per Dr. Lanis.  Cont bottom and groin care.  Air mattress in place.  Palliative care consult cont full code status.  Current antibiotic Daptomycin .    Maurilio Deland Collet 02/08/2023 7:29 AM --  VASCULAR STAFF ADDENDUM: I have independently interviewed and examined the patient. I agree with the above.  Multimodal pain control for VAC change.  Wounds site looks excellent. Healing appropriately.  Plan for wound VAC change Thursday.  Would like to have one more VAC change prior to d/c.  From a vascular surgery perspective could be discharged Friday pending continued improvement.    Fonda FORBES Lanis MD [facility] Phone Number: [phone] 02/08/2023 9:00 AM    Laboratory Lab Results: No results for input(s): WBC, HGB, HCT, PLT in the last 72 hours. BMET No results for input(s): NA, K, CL, CO2, GLUCOSE, BUN, CREATININE, CALCIUM  in the last 72 hours.  COAG Lab Results  Component Value Date   INR 1.2 12/11/2022   INR 1.1 10/28/2021   INR 1.10 04/05/2017   No results found for: PTT

## 2023-02-09 DIAGNOSIS — T8740 Infection of amputation stump, unspecified extremity: Secondary | ICD-10-CM | POA: Diagnosis not present

## 2023-02-09 LAB — GLUCOSE, CAPILLARY
Glucose-Capillary: 95 mg/dL (ref 70–99)
Glucose-Capillary: 180 mg/dL — ABNORMAL HIGH (ref 70–99)
Glucose-Capillary: 85 mg/dL (ref 70–99)
Glucose-Capillary: 132 mg/dL — ABNORMAL HIGH (ref 70–99)

## 2023-02-09 NOTE — Progress Notes (Signed)
       Assessment/Planning: 73 y.o. male is s/p  I&D left AKA and wound vac 01/22/2023 and simple partial closure of left AKA and wound vac placement 01/25/2023 both by Dr. Magda and left AKA revision with myriad placement by Dr. Lanis              Last vac change 02/08/23 pending next vac change per Dr. Lanis.  Cont bottom and groin care.  Air mattress in place.  Plan for vac change Thursday at bedside.    Maurilio Deland Collet PA-C

## 2023-02-09 NOTE — Progress Notes (Signed)
 [facility] [place]  Subjective  - No complaints     Objective (!) 142/67 60 98.2 F (36.8 C) (Oral) 20 92%  Intake/Output Summary (Last 24 hours) at 02/09/2023 1242 Last data filed at 02/09/2023 0900 Gross per 24 hour  Intake 240 ml  Output 1250 ml  Net -1010 ml   Vac to good suction left LE General no acute distress Lungs non labored breathing   Assessment/Planning: 73 y.o. male is s/p  I&D left AKA and wound vac 01/22/2023 and simple partial closure of left AKA and wound vac placement 01/25/2023 both by Dr. Magda and left AKA revision with myriad placement by Dr. Lanis   Cont bottom and groin care.  Air mattress in place.  Palliative care consult cont full code status.  Current antibiotic Daptomycin .  Plan for [facility] change Thursday. Will be ready for d/c from vascular surgery perspective Friday   Fonda FORBES Lanis 02/09/2023 12:42 PM --      Laboratory Lab Results: No results for input(s): WBC, HGB, HCT, PLT in the last 72 hours. BMET No results for input(s): NA, K, CL, CO2, GLUCOSE, BUN, CREATININE, CALCIUM  in the last 72 hours.  COAG Lab Results  Component Value Date   INR 1.2 12/11/2022   INR 1.1 10/28/2021   INR 1.10 04/05/2017   No results found for: PTT

## 2023-02-09 NOTE — Progress Notes (Signed)
 PROGRESS NOTE    James Dougherty  FMW:[id number] DOB: Jun 11, 1950 DOA: 01/22/2023 PCP: Atilano Deward ORN, MD    Brief Narrative:  73 year old with history of obesity, type 2 diabetes on insulin , hypertension, history of stroke, CKD, lymphedema.  Recent multiple hospitalizations.  Currently at long-term nursing home since September 2024.  Recent admission for left extremity wound, with below knee amputation.  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  Wound culture with MRSA.  Underwent above-knee amputation and wound VAC placement 12/27.  Remains in the hospital with poor pain control and ongoing need for wound care.  Subjective: Patient seen and examined.  No overnight events.  Expects to have dressing change and possible discharge Friday to the nursing home.  Pain is controlled on oral pain medications.  He feels fine otherwise except when it comes to wound VAC changes.   Assessment & Plan:   Severe peripheral vascular disease, diabetic and vascular insufficiency ulcer with left foot infection: Multiple procedures and ultimately left above-knee amputation. Blood cultures negative. Wound cultures with MRSA from 12/27. On different antibiotics but now remains on daptomycin .  ID recommended to continue daptomycin  for 6 weeks. Patient is on aspirin  and atorvastatin .  Plavix  on hold. Adequate pain medications.  He still requiring significant pain medications with wound VAC changes.  Chronic combined heart failure/hypertension: Currently stable on metoprolol .  Type 2 diabetes, uncontrolled with peripheral neuropathy: A1c 8.4.  On long-acting and short acting insulin .  Multiple chronic issues including Anemia of chronic disease, stable History of stroke and hyperlipidemia: Stable.  Currently on aspirin  and statins. Constipation, improved with MiraLAX .    DVT prophylaxis: enoxaparin  (LOVENOX ) injection 40 mg Start: 02/02/23 1215   Code  Status: Full code Family Communication: None Disposition Plan: Status is: Inpatient Remains inpatient appropriate because: Surgically active treatment.     Consultants:  Vascular surgery Infectious disease  Procedures:  Multiple left leg procedures  Antimicrobials:  Daptomycin      Objective: Vitals:   02/09/23 0000 02/09/23 0831 02/09/23 0900 02/09/23 0929  BP: (!) 136/92 (!) 146/60  (!) 142/70  Pulse: 70 (!) 57 65 78  Resp: 14 12 17 20   Temp:  97.7 F (36.5 C)    TempSrc:  Axillary    SpO2: 96% 100% 100% 100%  Weight:      Height:        Intake/Output Summary (Last 24 hours) at 02/09/2023 1130 Last data filed at 02/09/2023 9166 Gross per 24 hour  Intake --  Output 1250 ml  Net -1250 ml   Filed Weights   02/02/23 2215 02/05/23 0256 02/05/23 0758  Weight: 84.5 kg 84.4 kg 88.7 kg    Examination:  General exam: Looks fairly comfortable today.  Trying to mobilize around in the bed.  Doing some bed exercises. Frail and debilitated.   Respiratory system: Clear to auscultation. Respiratory effort normal. Cardiovascular system: S1 & S2 heard, RRR.  Gastrointestinal system: Soft and nontender.  Obese. Central nervous system: Alert and oriented. No focal neurological deficits.  Gross generalized weakness. Extremities: Symmetric 5 x 5 power. Skin:  Left AKA stump with thin wound VAC, serosanguineous drainage on the wound VAC.    Data Reviewed: I have personally reviewed following labs and imaging studies  CBC: No results for input(s): WBC, NEUTROABS, HGB, HCT, MCV, PLT in the last 168 hours.  Basic Metabolic Panel: Recent Labs  Lab 02/03/23 0352 02/04/23 0638  NA 136 140  K 4.6 4.6  CL 106 104  CO2 24 27  GLUCOSE 116* 119*  BUN 11 16  CREATININE 1.11 1.10  CALCIUM  7.9* 8.4*   GFR: Estimated Creatinine Clearance: 65.7 mL/min (by C-G formula based on SCr of 1.1 mg/dL). Liver Function Tests: No results for input(s): AST, ALT,  ALKPHOS, BILITOT, PROT, ALBUMIN  in the last 168 hours. No results for input(s): LIPASE, AMYLASE in the last 168 hours. No results for input(s): AMMONIA in the last 168 hours. Coagulation Profile: No results for input(s): INR, PROTIME in the last 168 hours. Cardiac Enzymes: Recent Labs  Lab 02/04/23 0638 02/05/23 0330 02/08/23 0720  CKTOTAL 474* 224 58   BNP (last 3 results) No results for input(s): PROBNP in the last 8760 hours. HbA1C: No results for input(s): HGBA1C in the last 72 hours. CBG: Recent Labs  Lab 02/08/23 0611 02/08/23 1122 02/08/23 1613 02/08/23 2132 02/09/23 0619  GLUCAP 108* 103* 124* 98 95   Lipid Profile: No results for input(s): CHOL, HDL, LDLCALC, TRIG, CHOLHDL, LDLDIRECT in the last 72 hours. Thyroid  Function Tests: No results for input(s): TSH, T4TOTAL, FREET4, T3FREE, THYROIDAB in the last 72 hours. Anemia Panel: No results for input(s): VITAMINB12, FOLATE, FERRITIN, TIBC, IRON, RETICCTPCT in the last 72 hours. Sepsis Labs: No results for input(s): PROCALCITON, LATICACIDVEN in the last 168 hours.  No results found for this or any previous visit (from the past 240 hours).       Radiology Studies: No results found.      Scheduled Meds:  acetaminophen   825 mg Oral TID   aspirin  EC  81 mg Oral Daily   atorvastatin   80 mg Oral QHS   enoxaparin  (LOVENOX ) injection  40 mg Subcutaneous Q24H   gabapentin   600 mg Oral TID   insulin  aspart  0-15 Units Subcutaneous TID WC   insulin  aspart  0-5 Units Subcutaneous QHS   insulin  glargine-yfgn  10 Units Subcutaneous QHS   metoprolol  succinate  50 mg Oral Daily   nystatin    Topical TID   senna-docusate  1 tablet Oral Daily   Continuous Infusions:  DAPTOmycin  700 mg (02/08/23 1449)     LOS: 18 days    Time spent: 40 minutes    Renato Applebaum, MD [facility]

## 2023-02-09 NOTE — Plan of Care (Signed)
  Problem: Clinical Measurements: Goal: Diagnostic test results will improve Outcome: Progressing   Problem: Clinical Measurements: Goal: Will remain free from infection Outcome: Progressing   Problem: Clinical Measurements: Goal: Respiratory complications will improve Outcome: Progressing   Problem: Coping: Goal: Level of anxiety will decrease Outcome: Progressing   Problem: Pain Management: Goal: General experience of comfort will improve Outcome: Progressing   Problem: Elimination: Goal: Will not experience complications related to urinary retention Outcome: Progressing

## 2023-02-09 NOTE — TOC Progression Note (Signed)
 Transition of Care [facility]) - Progression Note    Patient Details  Name: James Dougherty MRN: [id number] Date of Birth: 1950/11/27  Transition of Care [facility]) CM/SW Contact  Montie LOISE Louder, [place] Phone Number: 02/09/2023, 12:34 PM  Clinical Narrative:     10:46- left voice message for admission at [facility] 12:15- spoke with admission- updated  per MD, anticipate d/c on Friday.  Including, patient will need wound vac and air mattress.  12:36- CSW faxed copy of wound vac order and progress note  Montie Louder, MSW, LCSW Clinical Social Worker     Expected Discharge Plan: Skilled Nursing Facility Barriers to Discharge: Continued Medical Work up  Expected Discharge Plan and Services In-house Referral: Clinical Social Work     Living arrangements for the past 2 months: Skilled Nursing Facility                                       Social Determinants of Health (SDOH) Interventions SDOH Screenings   Food Insecurity: No Food Insecurity (01/22/2023)  Housing: Unknown (01/22/2023)  Transportation Needs: No Transportation Needs (01/22/2023)  Utilities: Not At Risk (01/22/2023)  Depression (PHQ2-9): Low Risk  (12/03/2021)  Financial Resource Strain: Patient Declined (10/20/2022)   Received from [facility]  Physical Activity: Inactive (10/20/2022)   Received from [facility]  Social Connections: Unknown (02/01/2023)  Stress: No Stress Concern Present (10/20/2022)   Received from [facility]  Tobacco Use: Medium Risk (02/01/2023)  Health Literacy: Medium Risk (12/23/2022)   Received from [facility]    Readmission Risk Interventions     No data to display

## 2023-02-09 NOTE — TOC Progression Note (Signed)
 Transition of Care [facility]) - Progression Note    Patient Details  Name: James Dougherty MRN: [id number] Date of Birth: 1950/11/30  Transition of Care [facility]) CM/SW Contact  Montie LOISE Louder, [place] Phone Number: 02/09/2023, 4:24 PM  Clinical Narrative:     3:59 pm- patient's daughter provided new member ID # [id number]-  Insurance shara was started reference # [id number]- insurance pending   3:43 pm- CSW met with the patient - informed new insurance is needed before we can start auth for SNF. Patient requested CSW contact his daughter, Maurilio.  3:27 pm- contacted patient's insurance- was informed patient has a new ID number- information is needed before shara can be started.   TOC will continue to follow and assist with discharge planning.  Montie Louder, MSW, LCSW Clinical Social Worker    Expected Discharge Plan: Skilled Nursing Facility Barriers to Discharge: Continued Medical Work up  Expected Discharge Plan and Services In-house Referral: Clinical Social Work     Living arrangements for the past 2 months: Skilled Nursing Facility                                       Social Determinants of Health (SDOH) Interventions SDOH Screenings   Food Insecurity: No Food Insecurity (01/22/2023)  Housing: Unknown (01/22/2023)  Transportation Needs: No Transportation Needs (01/22/2023)  Utilities: Not At Risk (01/22/2023)  Depression (PHQ2-9): Low Risk  (12/03/2021)  Financial Resource Strain: Patient Declined (10/20/2022)   Received from [facility]  Physical Activity: Inactive (10/20/2022)   Received from [facility]  Social Connections: Unknown (02/01/2023)  Stress: No Stress Concern Present (10/20/2022)   Received from [facility]  Tobacco Use: Medium Risk (02/01/2023)  Health Literacy: Medium Risk (12/23/2022)   Received from [facility]    Readmission Risk Interventions     No data to display

## 2023-02-10 ENCOUNTER — Other Ambulatory Visit: Payer: Self-pay

## 2023-02-10 DIAGNOSIS — T8740 Infection of amputation stump, unspecified extremity: Secondary | ICD-10-CM | POA: Diagnosis not present

## 2023-02-10 DIAGNOSIS — Z515 Encounter for palliative care: Secondary | ICD-10-CM

## 2023-02-10 DIAGNOSIS — M79605 Pain in left leg: Secondary | ICD-10-CM

## 2023-02-10 LAB — GLUCOSE, CAPILLARY
Glucose-Capillary: 107 mg/dL — ABNORMAL HIGH (ref 70–99)
Glucose-Capillary: 106 mg/dL — ABNORMAL HIGH (ref 70–99)
Glucose-Capillary: 106 mg/dL — ABNORMAL HIGH (ref 70–99)
Glucose-Capillary: 112 mg/dL — ABNORMAL HIGH (ref 70–99)

## 2023-02-10 LAB — AEROBIC/ANAEROBIC CULTURE W GRAM STAIN (SURGICAL/DEEP WOUND)

## 2023-02-10 NOTE — Progress Notes (Addendum)
   Subjective  - No complaints      Vac to good suction left LE General no acute distress Lungs non labored breathing   vac change tomorrow will order vac supplies to bed side.  Assessment/Planning: 73 y.o. male is s/p  I&D left AKA and wound vac 01/22/2023 and simple partial closure of left AKA and wound vac placement 01/25/2023 both by Dr. Edgardo Goodwill and left AKA revision with myriad placement by Dr. Rosalva Comber              Cont bottom and groin care.  Air mattress in place.             Palliative care consult cont full code status.  Current antibiotic Daptomycin .  Rocky Cipro PA-C

## 2023-02-10 NOTE — Plan of Care (Signed)
  Problem: Education: Goal: Knowledge of General Education information will improve Description: Including pain rating scale, medication(s)/side effects and non-pharmacologic comfort measures Outcome: Progressing   Problem: Health Behavior/Discharge Planning: Goal: Ability to manage health-related needs will improve Outcome: Progressing   Problem: Clinical Measurements: Goal: Ability to maintain clinical measurements within normal limits will improve Outcome: Progressing Goal: Will remain free from infection Outcome: Progressing Goal: Diagnostic test results will improve Outcome: Progressing Goal: Respiratory complications will improve Outcome: Progressing Goal: Cardiovascular complication will be avoided Outcome: Progressing   Problem: Activity: Goal: Risk for activity intolerance will decrease Outcome: Progressing   Problem: Nutrition: Goal: Adequate nutrition will be maintained Outcome: Progressing   Problem: Coping: Goal: Level of anxiety will decrease Outcome: Progressing   Problem: Elimination: Goal: Will not experience complications related to bowel motility Outcome: Progressing Goal: Will not experience complications related to urinary retention Outcome: Progressing   Problem: Pain Management: Goal: General experience of comfort will improve Outcome: Progressing   Problem: Safety: Goal: Ability to remain free from injury will improve Outcome: Progressing   Problem: Skin Integrity: Goal: Risk for impaired skin integrity will decrease Outcome: Progressing   Problem: Fluid Volume: Goal: Hemodynamic stability will improve Outcome: Progressing   Problem: Clinical Measurements: Goal: Diagnostic test results will improve Outcome: Progressing Goal: Signs and symptoms of infection will decrease Outcome: Progressing   Problem: Respiratory: Goal: Ability to maintain adequate ventilation will improve Outcome: Progressing   Problem: Education: Goal:  Ability to describe self-care measures that may prevent or decrease complications (Diabetes Survival Skills Education) will improve Outcome: Progressing Goal: Individualized Educational Video(s) Outcome: Progressing   Problem: Coping: Goal: Ability to adjust to condition or change in health will improve Outcome: Progressing   Problem: Fluid Volume: Goal: Ability to maintain a balanced intake and output will improve Outcome: Progressing   Problem: Health Behavior/Discharge Planning: Goal: Ability to identify and utilize available resources and services will improve Outcome: Progressing Goal: Ability to manage health-related needs will improve Outcome: Progressing   Problem: Metabolic: Goal: Ability to maintain appropriate glucose levels will improve Outcome: Progressing   Problem: Nutritional: Goal: Maintenance of adequate nutrition will improve Outcome: Progressing Goal: Progress toward achieving an optimal weight will improve Outcome: Progressing   Problem: Skin Integrity: Goal: Risk for impaired skin integrity will decrease Outcome: Progressing   Problem: Tissue Perfusion: Goal: Adequacy of tissue perfusion will improve Outcome: Progressing

## 2023-02-10 NOTE — Plan of Care (Signed)

## 2023-02-10 NOTE — Progress Notes (Addendum)
 Daily Progress Note   Patient Name: James Dougherty       Date: 02/10/2023 DOB: 01-08-1951  Age: 73 y.o. MRN#: [id number] Attending Physician: Vada Garibaldi, MD Primary Care Physician: Orest Bio, MD Admit Date: 01/22/2023  Reason for Consultation/Follow-up: Establishing goals of care  Subjective: Pt denies pain at this time, states he is receiving morphine  for his pain. He reports that he wil be getting dressing change tomorrow that he is not looking forward to because the pain is difficult to control. He says he is going to [facility] Friday. He reports eating most of his meals.   Length of Stay: 19  Current Medications: Scheduled Meds:   acetaminophen   825 mg Oral TID   aspirin  EC  81 mg Oral Daily   atorvastatin   80 mg Oral QHS   enoxaparin  (LOVENOX ) injection  40 mg Subcutaneous Q24H   gabapentin   600 mg Oral TID   insulin  aspart  0-15 Units Subcutaneous TID WC   insulin  aspart  0-5 Units Subcutaneous QHS   insulin  glargine-yfgn  10 Units Subcutaneous QHS   metoprolol  succinate  50 mg Oral Daily   nystatin    Topical TID   senna-docusate  1 tablet Oral Daily    Continuous Infusions:  DAPTOmycin  700 mg (02/10/23 1356)    PRN Meds: acetaminophen  **OR** acetaminophen , hydrALAZINE , HYDROcodone -acetaminophen , morphine  injection, ondansetron  (ZOFRAN ) IV  Physical Exam Vitals reviewed.  Constitutional:      Appearance: He is not ill-appearing.  Pulmonary:     Effort: Pulmonary effort is normal. No respiratory distress.  Skin:    General: Skin is warm and dry.     Comments: Wound vac to L AKA  Neurological:     Mental Status: He is alert and oriented to person, place, and time.  Psychiatric:        Mood and Affect: Mood normal.        Behavior: Behavior normal.              Vital Signs: BP (!) 156/59 (BP Location: Right Arm)   Pulse (!) 55   Temp 98.1 F (36.7 C) (Oral)   Resp 15   Ht 5\' 8"  (1.727 m)   Wt 88.7 kg   SpO2 97%   BMI 29.73 kg/m  SpO2: SpO2: 97 % O2 Device: O2 Device: Room Air O2 Flow Rate: O2 Flow Rate (L/min): 2 L/min  Intake/output summary:  Intake/Output Summary (Last 24 hours) at 02/10/2023 1508 Last data filed at 02/10/2023 0900 Gross per 24 hour  Intake 240 ml  Output 1610 ml  Net -1370 ml   LBM: Last BM Date : 02/05/23 Baseline Weight: Weight: 91.6 kg Most recent weight: Weight: 88.7 kg       Palliative Assessment/Data: 50%      Patient Active Problem List   Diagnosis Date Noted   Amputation stump infection (HCC) 01/22/2023   Multiple open wounds of left lower extremity 12/11/2022   S/P AKA (above knee amputation) unilateral, left (HCC) 12/11/2022   S/P AKA (above knee amputation) unilateral (HCC) 12/11/2022   Lymphedema 12/03/2021   Neuropathic pain 12/03/2021   TBI (traumatic brain injury) (HCC) 11/04/2021   Subdural hematoma (HCC) 10/28/2021  Gangrene (HCC) 04/26/2017   Cellulitis 04/01/2017   Gangrene of toe (HCC) 04/01/2017   Anemia 04/01/2017   Hyperkalemia 04/01/2017   Chronic kidney disease 04/01/2017   Type II diabetes mellitus with complication (HCC) 04/01/2017   Dry gangrene (HCC) 04/01/2017   Groin pain 07/04/2014   Peripheral vascular disease, unspecified (HCC) 06/27/2013   PAD (peripheral artery disease) (HCC) 12/27/2012   PVD (peripheral vascular disease) (HCC) 12/12/2012   Atherosclerosis of native arteries of the extremities with ulceration (HCC) 03/21/2012   Pain in limb 03/21/2012    Palliative Care Assessment & Plan    Assessment: I have reviewed medical records including EPIC notes, labs and imaging, and met with patient to discuss diagnosis prognosis, GOC, EOL wishes, disposition and options.   Elderly male watching TV in bed. He is alert and addresses this NP by name. He is in  no distress.    Cardiopulmonary Resuscitation: Attempt Resuscitation (CPR)  Medical Interventions: Full Scope of Treatment: Use intubation, advanced airway interventions, mechanical ventilation, cardioversion as indicated, medical treatment, IV fluids, etc, also provide comfort measures. Transfer to the hospital if indicated  Antibiotics: Antibiotics if indicated  IV Fluids: IV fluids if indicated  Feeding Tube: Feeding tube long-term if indicated     Recommendations/Plan: Continue current pain management MOST previously completed as above Full code/full scope Please call if additional palliative needs arise  Goals of Care and Additional Recommendations: Limitations on Scope of Treatment: Full Scope Treatment  Code Status: Full code  Prognosis:  Unable to determine  Discharge Planning: SNF   Thank you for allowing the Palliative Medicine Team to assist in the care of this patient.   Time spent includes: Detailed review of medical records (labs, imaging, vital signs), medically appropriate exam, discussion with treatment team, counseling and educating patient, family and/or staff, documenting clinical information, medication management and coordination of care.     *Please note that this is a verbal dictation therefore any spelling or grammatical errors are due to the "[employer] One" system interpretation.     Ina Manas, Joyice Nodal- [facility] Palliative Medicine Team  02/10/2023 3:08 PM  Office [phone]  Pager [phone]

## 2023-02-10 NOTE — Progress Notes (Signed)
 PROGRESS NOTE    James Dougherty  ZOX:[id number] DOB: 04/22/1950 DOA: 01/22/2023 PCP: Orest Bio, MD    Brief Narrative:  73 year old with history of obesity, type 2 diabetes on insulin , hypertension, history of stroke, CKD, lymphedema.  Recent multiple hospitalizations.  Currently at [facility] since September 2024.  Recent admission for left extremity wound, with below knee amputation.  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  Wound culture with MRSA.  Underwent above-knee amputation and wound VAC placement 12/27.  Remains in the hospital with poor pain control and ongoing need for wound care.  Subjective:  Seen and examined.  No new events.  Pain controlled by pills unless for wound VAC change.  Anticipating VAC changes tomorrow and discharge to SNF.   Assessment & Plan:   Severe peripheral vascular disease, diabetic and vascular insufficiency ulcer with left foot infection: Multiple procedures and ultimately left above-knee amputation. Blood cultures negative. Wound cultures with MRSA from 12/27. On different antibiotics but now remains on daptomycin . End date 03/08/2023.  Patient is on aspirin  and atorvastatin .  Plavix  on hold. Adequate pain medications.  He still requiring significant pain medications with wound VAC changes. Looks like he does not have any PICC line for long-term medications.  Will have to place one.   Chronic combined heart failure/hypertension: Currently stable on metoprolol .  Type 2 diabetes, uncontrolled with peripheral neuropathy: A1c 8.4.  On long-acting and short acting insulin .  Multiple chronic issues including Anemia of chronic disease, stable History of stroke and hyperlipidemia: Stable.  Currently on aspirin  and statins. Constipation, improved with MiraLAX .    DVT prophylaxis: enoxaparin  (LOVENOX ) injection 40 mg Start: 02/02/23 1215   Code Status: Full code Family Communication: None Disposition  Plan: Status is: Inpatient Remains inpatient appropriate because: Surgically active treatment.     Consultants:  Vascular surgery Infectious disease  Procedures:  Multiple left leg procedures  Antimicrobials:  Daptomycin      Objective: Vitals:   02/09/23 2000 02/10/23 0400 02/10/23 0807 02/10/23 0946  BP: (!) 118/46 (!) 128/53 138/63 (!) 155/76  Pulse:  80 (!) 55 88  Resp: 15 12 12    Temp: 98.5 F (36.9 C) 98.6 F (37 C) 98.3 F (36.8 C)   TempSrc: Oral Oral Oral   SpO2: 98% 96% 97%   Weight:      Height:        Intake/Output Summary (Last 24 hours) at 02/10/2023 1139 Last data filed at 02/10/2023 0900 Gross per 24 hour  Intake 240 ml  Output 1610 ml  Net -1370 ml   Filed Weights   02/02/23 2215 02/05/23 0256 02/05/23 0758  Weight: 84.5 kg 84.4 kg 88.7 kg    Examination:  General exam: Comfortable lying in bed. Respiratory system: Clear to auscultation. Respiratory effort normal. Cardiovascular system: S1 & S2 heard, RRR.  Gastrointestinal system: Soft and nontender.  Obese. Central nervous system: Alert and oriented. No focal neurological deficits.  Gross generalized weakness. Extremities: Symmetric 5 x 5 power. Skin:  Left AKA stump with thin wound VAC,    Data Reviewed: I have personally reviewed following labs and imaging studies  CBC: No results for input(s): "WBC", "NEUTROABS", "HGB", "HCT", "MCV", "PLT" in the last 168 hours.  Basic Metabolic Panel: Recent Labs  Lab 02/04/23 0638  NA 140  K 4.6  CL 104  CO2 27  GLUCOSE 119*  BUN 16  CREATININE 1.10  CALCIUM  8.4*   GFR: Estimated Creatinine Clearance: 65.7  mL/min (by C-G formula based on SCr of 1.1 mg/dL). Liver Function Tests: No results for input(s): "AST", "ALT", "ALKPHOS", "BILITOT", "PROT", "ALBUMIN " in the last 168 hours. No results for input(s): "LIPASE", "AMYLASE" in the last 168 hours. No results for input(s): "AMMONIA" in the last 168 hours. Coagulation Profile: No  results for input(s): "INR", "PROTIME" in the last 168 hours. Cardiac Enzymes: Recent Labs  Lab 02/04/23 0638 02/05/23 0330 02/08/23 0720  CKTOTAL 474* 224 58   BNP (last 3 results) No results for input(s): "PROBNP" in the last 8760 hours. HbA1C: No results for input(s): "HGBA1C" in the last 72 hours. CBG: Recent Labs  Lab 02/09/23 0619 02/09/23 1211 02/09/23 1635 02/09/23 2010 02/10/23 0604  GLUCAP 95 180* 85 132* 107*   Lipid Profile: No results for input(s): "CHOL", "HDL", "LDLCALC", "TRIG", "CHOLHDL", "LDLDIRECT" in the last 72 hours. Thyroid  Function Tests: No results for input(s): "TSH", "T4TOTAL", "FREET4", "T3FREE", "THYROIDAB" in the last 72 hours. Anemia Panel: No results for input(s): "VITAMINB12", "FOLATE", "FERRITIN", "TIBC", "IRON", "RETICCTPCT" in the last 72 hours. Sepsis Labs: No results for input(s): "PROCALCITON", "LATICACIDVEN" in the last 168 hours.  No results found for this or any previous visit (from the past 240 hours).       Radiology Studies: No results found.      Scheduled Meds:  acetaminophen   825 mg Oral TID   aspirin  EC  81 mg Oral Daily   atorvastatin   80 mg Oral QHS   enoxaparin  (LOVENOX ) injection  40 mg Subcutaneous Q24H   gabapentin   600 mg Oral TID   insulin  aspart  0-15 Units Subcutaneous TID WC   insulin  aspart  0-5 Units Subcutaneous QHS   insulin  glargine-yfgn  10 Units Subcutaneous QHS   metoprolol  succinate  50 mg Oral Daily   nystatin    Topical TID   senna-docusate  1 tablet Oral Daily   Continuous Infusions:  DAPTOmycin  700 mg (02/09/23 1517)     LOS: 19 days    Time spent: 40 minutes    Vada Garibaldi, MD [facility]

## 2023-02-10 NOTE — Progress Notes (Signed)
 Occupational Therapy Treatment Patient Details Name: James Dougherty MRN: [id number] DOB: Feb 22, 1950 Today's Date: 02/10/2023   History of present illness 73 y.o. male presents to [facility] 01/22/23 from [facility] rehab for post-op evaluation of recent L AKA (11/15) w/ concern for infection and severe sepsis. S/p I&D of L AKA and application of wound vac 12/27. s/p I&D and wound vac change 12/30. s/p wound washout and revision of AKA 1/6. New Wound vac placed 1/12. Past medical history of arthritis, hyperlipidemia, hypertension, neuropathic pain, PAD, psoriasis, stroke, diabetes, CKD 3B, amputation R great toe 2019, R ICA stenosis   OT comments  Pt c/o moderate pain with movement, able to tolerate sitting EOB up to 15 minutes. Pt max A for in/out of bed, increased time for positioning return to supine due to sores on backside and max A for bed mobility. Pt would benefit from further acute care to maximize progress as able, DC to postacute rehab <3hrs/day still appropriate.       If plan is discharge home, recommend the following:  Direct supervision/assist for medications management;Assist for transportation;Two people to help with walking and/or transfers;Two people to help with bathing/dressing/bathroom   Equipment Recommendations  None recommended by OT    Recommendations for Other Services      Precautions / Restrictions Precautions Precautions: Fall Precaution Comments: L AKA Restrictions Weight Bearing Restrictions Per Provider Order: Yes LLE Weight Bearing Per Provider Order: Non weight bearing Other Position/Activity Restrictions: wound vac       Mobility Bed Mobility Overal bed mobility: Needs Assistance Bed Mobility: Supine to Sit, Sit to Supine     Supine to sit: Max assist, Used rails, HOB elevated Sit to supine: Max assist, Used rails   General bed mobility comments: max A for supine to sit and back, tolerated well, used rails.    Transfers Overall transfer level: Needs  assistance                       Balance Overall balance assessment: Needs assistance Sitting-balance support: No upper extremity supported, Feet supported Sitting balance-Leahy Scale: Poor Sitting balance - Comments: leaning to the R Max A to get bil UE to relax to fully sit on EOB> Postural control: Posterior lean, Right lateral lean Standing balance support: Bilateral upper extremity supported, During functional activity Standing balance-Leahy Scale: Zero Standing balance comment: unable to achieve full standing                           ADL either performed or assessed with clinical judgement   ADL Overall ADL's : Needs assistance/impaired Eating/Feeding: Set up;Bed level               Upper Body Dressing : Moderate assistance;Sitting   Lower Body Dressing: Maximal assistance;Sitting/lateral leans                 General ADL Comments: Pt continues to have significant difficulty with bed mobility and sitting EOB due to pain and poor balance in sitting position. able to complete UB ADLs at bed level    Extremity/Trunk Assessment Upper Extremity Assessment Upper Extremity Assessment: Overall WFL for tasks assessed            Vision       Perception     Praxis      Cognition Arousal: Alert Behavior During Therapy: Anxious Overall Cognitive Status: Within Functional Limits for tasks assessed  General Comments: HOH, Pt remembers conversations from 1 week ago, able to participate as able.        Exercises      Shoulder Instructions       General Comments      Pertinent Vitals/ Pain       Pain Assessment Pain Assessment: Faces Faces Pain Scale: Hurts even more Pain Location: L residual limb Pain Descriptors / Indicators: Moaning, Grimacing, Discomfort, Aching Pain Intervention(s): Monitored during session  Home Living                                           Prior Functioning/Environment              Frequency  Min 1X/week        Progress Toward Goals  OT Goals(current goals can now be found in the care plan section)  Progress towards OT goals: Progressing toward goals  Acute Rehab OT Goals Patient Stated Goal: to manage pain OT Goal Formulation: With patient Time For Goal Achievement: 02/06/23 Potential to Achieve Goals: Good ADL Goals Pt Will Perform Upper Body Dressing: with set-up Pt Will Perform Lower Body Dressing: with set-up;with supervision;with adaptive equipment Pt Will Transfer to Toilet: with contact guard assist;with transfer board Pt Will Perform Toileting - Clothing Manipulation and hygiene: with set-up;with supervision Additional ADL Goal #1: Pt will be able to perform bed mobility with supervision to prepare for transfers and ADLs to maximize independence and return to PLOF.  Plan      Co-evaluation                 AM-PAC OT "6 Clicks" Daily Activity     Outcome Measure   Help from another person eating meals?: A Little Help from another person taking care of personal grooming?: A Little Help from another person toileting, which includes using toliet, bedpan, or urinal?: Total Help from another person bathing (including washing, rinsing, drying)?: Total Help from another person to put on and taking off regular upper body clothing?: A Lot Help from another person to put on and taking off regular lower body clothing?: Total 6 Click Score: 11    End of Session    OT Visit Diagnosis: Unsteadiness on feet (R26.81);Other abnormalities of gait and mobility (R26.89);Muscle weakness (generalized) (M62.81);Pain Pain - Right/Left: Left Pain - part of body: Leg   Activity Tolerance Patient limited by pain   Patient Left in bed;with call bell/phone within reach;with bed alarm set   Nurse Communication Mobility status        Time: 1610-9604 OT Time Calculation (min): 20 min  Charges: OT General  Charges $OT Visit: 1 Visit OT Treatments $Therapeutic Activity: 8-22 mins  Willard, OTR/L   Scherry Curtis 02/10/2023, 5:16 PM

## 2023-02-10 NOTE — TOC Progression Note (Signed)
 Transition of Care [facility]) - Progression Note    Patient Details  Name: James Dougherty MRN: [id number] Date of Birth: [birth year]  [facility] CM/SW Contact  Valery Gaucher, [place] Phone Number: 02/10/2023, 2:40 PM  Clinical Narrative:     Received insurance approval for SNF/ Cristine Done # [id number] from 01/15-01/17  Per MD- anticipate d/c Friday- SNF has been updated   Liddie Reel, MSW, LCSW Clinical Social Worker    Expected Discharge Plan: Skilled Nursing Facility Barriers to Discharge: Continued Medical Work up  Expected Discharge Plan and Services In-house Referral: Clinical Social Work     Living arrangements for the past 2 months: Skilled Nursing Facility                                       Social Determinants of Health (SDOH) Interventions SDOH Screenings   Food Insecurity: No Food Insecurity (01/22/2023)  Housing: Unknown (01/22/2023)  Transportation Needs: No Transportation Needs (01/22/2023)  Utilities: Not At Risk (01/22/2023)  Depression (PHQ2-9): Low Risk  (12/03/2021)  Financial Resource Strain: Patient Declined (10/20/2022)   Received from [facility]  Physical Activity: Inactive (10/20/2022)   Received from [facility]  Social Connections: Unknown (02/01/2023)  Stress: No Stress Concern Present (10/20/2022)   Received from [facility]  Tobacco Use: Medium Risk (02/01/2023)  Health Literacy: Medium Risk (12/23/2022)   Received from [facility]    Readmission Risk Interventions     No data to display

## 2023-02-10 NOTE — TOC Progression Note (Signed)
 Transition of Care [facility] - Progression Note    Patient Details  Name: James Dougherty MRN: [id number] Date of Birth: 03-08-1950  [facility] CM/SW Contact  Valery Gaucher, [place] Phone Number: 02/10/2023, 11:35 AM  Clinical Narrative:     Insurance still pending   Expected Discharge Plan: Skilled Nursing Facility Barriers to Discharge: Continued Medical Work up  Expected Discharge Plan and Services In-house Referral: Clinical Social Work     Living arrangements for the past 2 months: Skilled Nursing Facility                                       Social Determinants of Health (SDOH) Interventions SDOH Screenings   Food Insecurity: No Food Insecurity (01/22/2023)  Housing: Unknown (01/22/2023)  Transportation Needs: No Transportation Needs (01/22/2023)  Utilities: Not At Risk (01/22/2023)  Depression (PHQ2-9): Low Risk  (12/03/2021)  Financial Resource Strain: Patient Declined (10/20/2022)   Received from [facility]  Physical Activity: Inactive (10/20/2022)   Received from [facility]  Social Connections: Unknown (02/01/2023)  Stress: No Stress Concern Present (10/20/2022)   Received from [facility]  Tobacco Use: Medium Risk (02/01/2023)  Health Literacy: Medium Risk (12/23/2022)   Received from [facility]    Readmission Risk Interventions     No data to display

## 2023-02-11 DIAGNOSIS — T8740 Infection of amputation stump, unspecified extremity: Secondary | ICD-10-CM | POA: Diagnosis not present

## 2023-02-11 LAB — BASIC METABOLIC PANEL
Anion gap: 8 (ref 5–15)
BUN: 10 mg/dL (ref 8–23)
CO2: 24 mmol/L (ref 22–32)
Calcium: 8.1 mg/dL — ABNORMAL LOW (ref 8.9–10.3)
Chloride: 105 mmol/L (ref 98–111)
Creatinine, Ser: 0.87 mg/dL (ref 0.61–1.24)
GFR, Estimated: >60 mL/min (ref >60)
Glucose, Bld: 98 mg/dL (ref 70–99)
Potassium: 4.2 mmol/L (ref 3.5–5.1)
Sodium: 137 mmol/L (ref 135–145)

## 2023-02-11 LAB — GLUCOSE, CAPILLARY
Glucose-Capillary: 86 mg/dL (ref 70–99)
Glucose-Capillary: 172 mg/dL — ABNORMAL HIGH (ref 70–99)
Glucose-Capillary: 132 mg/dL — ABNORMAL HIGH (ref 70–99)
Glucose-Capillary: 121 mg/dL — ABNORMAL HIGH (ref 70–99)

## 2023-02-11 MED ORDER — CHLORHEXIDINE GLUCONATE CLOTH 2 % EX PADS
6.0000 | MEDICATED_PAD | Freq: Every day | CUTANEOUS | Status: DC
  Administered 2023-02-11 – 2023-02-12 (×2): 6 via TOPICAL

## 2023-02-11 MED ORDER — SODIUM CHLORIDE 0.9% FLUSH: 10.0000 mL | INTRAVENOUS | Status: DC | PRN

## 2023-02-11 MED ORDER — SODIUM CHLORIDE 0.9% FLUSH
10.0000 mL | Freq: Two times a day (BID) | INTRAVENOUS | Status: DC
  Administered 2023-02-11 – 2023-02-12 (×4): 10 mL

## 2023-02-11 NOTE — Progress Notes (Signed)
Physical Therapy Treatment Patient Details Name: James Dougherty MRN: [id number] DOB: 1950-09-22 Today's Date: 02/11/2023   History of Present Illness 73 y.o. male presents to [facility] 01/22/23 from [facility] rehab for post-op evaluation of recent L AKA (11/15) w/ concern for infection and severe sepsis. S/p I&D of L AKA and application of wound vac 12/27. s/p I&D and wound vac change 12/30. s/p wound washout and revision of AKA 1/6. New Wound vac placed 1/12. Past medical history of arthritis, hyperlipidemia, hypertension, neuropathic pain, PAD, psoriasis, stroke, diabetes, CKD 3B, amputation R great toe 2019, R ICA stenosis    PT Comments  Pt is slowly progressing towards goals. Pt was Max A for supine to sitting and Mod A +2 for sitting to supine. Pt had improved ability to follow directions this session from last session and was demonstrating signs of less pain though pt was premedicated prior to this session and last session. Pt was able to sit EOB and perform seated there-ex including LAQ and ankle pumps. Pt performed wgt shifting to work on UE strength for bed mobility and transfers. Due to pt current functional status, home set up and available assistance at home recommending skilled physical therapy services < 3 hours/day in order to address strength, balance and functional mobility to decrease risk for falls, injury, immobility, skin break down and re-hospitalization.      If plan is discharge home, recommend the following: Assist for transportation;Help with stairs or ramp for entrance;Two people to help with walking and/or transfers;Assistance with cooking/housework   Can travel by private vehicle     No  Equipment Recommendations  Other (comment) (defer to post acute)       Precautions / Restrictions Precautions Precautions: Fall Precaution Comments: L AKA Restrictions Weight Bearing Restrictions Per Provider Order: Yes LLE Weight Bearing Per Provider Order: Non weight bearing Other  Position/Activity Restrictions: wound vac     Mobility  Bed Mobility Overal bed mobility: Needs Assistance Bed Mobility: Sidelying to Sit, Sit to Sidelying Rolling: Used rails, Max assist, Min assist Sidelying to sit: Max assist     Sit to sidelying: Mod assist, +2 for physical assistance General bed mobility comments: Pt is Max A for rolling to the R and Min A for rolling supine. PT was Max A for sitting EOB with assist at the trunk to mid line and to scoot to EOB> Pt was Mod A +2 with multi modal cueing to decrease need for physical assist for sitting to supine.    Transfers   General transfer comment: unable to progress today due to pain    Ambulation/Gait   General Gait Details: not able      Balance Overall balance assessment: Needs assistance Sitting-balance support: No upper extremity supported, Feet supported Sitting balance-Leahy Scale: Fair Sitting balance - Comments: Improved this session. CGA for safety        Cognition Arousal: Alert Behavior During Therapy: Anxious Overall Cognitive Status: Within Functional Limits for tasks assessed         General Comments: HOH           General Comments General comments (skin integrity, edema, etc.): PT O2 sats remained in the 90's with HR between 90-100 bpm. Pt was breathing with quick respirations with cueing to slow down. Pt was demonstrating signs of less pain in sitting and stated "I feel pretty good."      Pertinent Vitals/Pain Pain Assessment Pain Assessment: Faces Faces Pain Scale: Hurts even more Pain Location: L residual limb  Pain Descriptors / Indicators: Moaning, Grimacing, Discomfort, Aching Pain Intervention(s): Monitored during session     PT Goals (current goals can now be found in the care plan section) Acute Rehab PT Goals Patient Stated Goal: to get stronger PT Goal Formulation: With patient Time For Goal Achievement: 02/22/23 Potential to Achieve Goals: Fair Progress towards PT  goals: Progressing toward goals    Frequency    Min 1X/week      PT Plan  Continue with current POC        AM-PAC PT "6 Clicks" Mobility   Outcome Measure  Help needed turning from your back to your side while in a flat bed without using bedrails?: A Lot Help needed moving from lying on your back to sitting on the side of a flat bed without using bedrails?: A Lot Help needed moving to and from a bed to a chair (including a wheelchair)?: Total Help needed standing up from a chair using your arms (e.g., wheelchair or bedside chair)?: Total Help needed to walk in hospital room?: Total Help needed climbing 3-5 steps with a railing? : Total 6 Click Score: 8    End of Session Equipment Utilized During Treatment: Gait belt Activity Tolerance: Patient limited by pain;Patient tolerated treatment well Patient left: in bed;with call bell/phone within reach;with bed alarm set Nurse Communication: Mobility status PT Visit Diagnosis: Muscle weakness (generalized) (M62.81);Other abnormalities of gait and mobility (R26.89)     Time: 6045-4098 PT Time Calculation (min) (ACUTE ONLY): 15 min  Charges:    $Therapeutic Exercise: 8-22 mins PT General Charges $$ ACUTE PT VISIT: 1 Visit                     Harrel Carina, DPT, CLT  Acute Rehabilitation Services Office: [phone] (Secure chat preferred)    Claudia Desanctis 02/11/2023, 4:07 PM

## 2023-02-11 NOTE — Progress Notes (Addendum)
[facility] [place]  Subjective  - Crying with vac change   Objective (!) 124/56 (!) 52 97.7 F (36.5 C) (Oral) 18 97%  Intake/Output Summary (Last 24 hours) at 02/11/2023 0805 Last data filed at 02/11/2023 1610 Gross per 24 hour  Intake 840 ml  Output 3370 ml  Net -2530 ml    Left AKA wound vac to good suction, left thigh warm to touch Lungs non labored breathing General no acute distress prior to vac change  Assessment/Planning: 73 y.o. male is s/p I&D left AKA and wound vac 01/22/2023 and simple partial closure of left AKA and wound vac placement 01/25/2023 both by Dr. Lenell Antu and left AKA revision with myriad placement by Dr. Mingo Amber change today with [facility] for discharge when stable per primary team Continue bottom and groin care Current antibiotic Daptomycin.    Mosetta Pigeon 02/11/2023 8:05 AM -- VASCULAR STAFF ADDENDUM: I have independently interviewed and examined the patient. I agree with the above.   Pt premedicated for VAC change. Continued to have significant pain with it at the incision site.  Wound bed healthy. Small amount of nonviable tissue debrided.  Post-vac meds given.  Think pain is both anxiety drive and tissue sensitivity.  Pt okay for discharge. Will need premedication for 2x weekly vac change with recommend xanax as well as 10mg  norco at a minimum.   Plan for Monday/Thursday change. Will see daily for as long as he remains in house.  F/u scheduled.     Victorino Sparrow MD [facility] Phone Number: [phone] 02/11/2023 8:30 AM     Laboratory Lab Results: No results for input(s): "WBC", "HGB", "HCT", "PLT" in the last 72 hours. BMET Recent Labs    02/11/23 0330  NA 137  K 4.2  CL 105  CO2 24  GLUCOSE 98  BUN 10  CREATININE 0.87  CALCIUM 8.1*    COAG Lab Results  Component Value Date   INR 1.2 12/11/2022   INR 1.1 10/28/2021   INR  1.10 04/05/2017   No results found for: "PTT"

## 2023-02-11 NOTE — Progress Notes (Signed)
PROGRESS NOTE    James Dougherty  ZOX:[id number] DOB: 06-01-1950 DOA: 01/22/2023 PCP: [name], MD    Brief Narrative:  73 year old with history of obesity, type 2 diabetes on insulin, hypertension, history of stroke, CKD, lymphedema.  Recent multiple hospitalizations.  Currently at [facility] since September 2024.  Recent admission for left extremity wound, with below knee amputation.  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  Wound culture with MRSA.  Underwent above-knee amputation and wound VAC placement 12/27.  Remains in the hospital with poor pain control and ongoing need for wound care.  Subjective:  Patient seen and examined.  Distressed after wound VAC change.  We discussed about going to nursing home tomorrow and using additional medications for wound VAC changes.   Assessment & Plan:   Severe peripheral vascular disease, diabetic and vascular insufficiency ulcer with left foot infection: Multiple procedures and ultimately left above-knee amputation. Blood cultures negative. Wound cultures with MRSA from 12/27. On different antibiotics but now remains on daptomycin. End date 03/08/2023.  Patient is on aspirin and atorvastatin.   Adequate pain medications.   PICC line today for anticipation of discharge. Will need additional premedication on wound VAC changes.  Chronic combined heart failure/hypertension: Currently stable on metoprolol.  Type 2 diabetes, uncontrolled with peripheral neuropathy: A1c 8.4.  On long-acting and short acting insulin.  Multiple chronic issues including Anemia of chronic disease, stable History of stroke and hyperlipidemia: Stable.  Currently on aspirin and statins. Constipation, improved with MiraLAX.    DVT prophylaxis: enoxaparin (LOVENOX) injection 40 mg Start: 02/02/23 1215   Code Status: Full code Family Communication: None Disposition Plan: Status is: Inpatient Remains inpatient  appropriate because: Surgically active treatment.     Consultants:  Vascular surgery Infectious disease  Procedures:  Multiple left leg procedures  Antimicrobials:  Daptomycin     Objective: Vitals:   02/10/23 2000 02/10/23 2344 02/11/23 0349 02/11/23 0843  BP: 120/80 (!) 138/58 (!) 124/56 (!) 120/56  Pulse: 60 (!) 58 (!) 52 62  Resp: 17 18 18 18   Temp: 97.6 F (36.4 C) 97.7 F (36.5 C) 97.7 F (36.5 C)   TempSrc: Oral Oral Oral Oral  SpO2: 96% 100% 97% 100%  Weight:      Height:        Intake/Output Summary (Last 24 hours) at 02/11/2023 1342 Last data filed at 02/11/2023 0646 Gross per 24 hour  Intake 600 ml  Output 2160 ml  Net -1560 ml   Filed Weights   02/02/23 2215 02/05/23 0256 02/05/23 0758  Weight: 84.5 kg 84.4 kg 88.7 kg    Examination:  General exam: Tearful after wound VAC change. Respiratory system: Clear to auscultation. Respiratory effort normal. Cardiovascular system: S1 & S2 heard, RRR.  Gastrointestinal system: Soft and nontender.  Obese. Central nervous system: Alert and oriented. No focal neurological deficits.  Gross generalized weakness. Extremities: Symmetric 5 x 5 power. Skin:  Left AKA stump with wound VAC,    Data Reviewed: I have personally reviewed following labs and imaging studies  CBC: No results for input(s): "WBC", "NEUTROABS", "HGB", "HCT", "MCV", "PLT" in the last 168 hours.  Basic Metabolic Panel: Recent Labs  Lab 02/11/23 0330  NA 137  K 4.2  CL 105  CO2 24  GLUCOSE 98  BUN 10  CREATININE 0.87  CALCIUM 8.1*   GFR: Estimated Creatinine Clearance: 83 mL/min (by C-G formula based on SCr of 0.87 mg/dL). Liver Function Tests:  No results for input(s): "AST", "ALT", "ALKPHOS", "BILITOT", "PROT", "ALBUMIN" in the last 168 hours. No results for input(s): "LIPASE", "AMYLASE" in the last 168 hours. No results for input(s): "AMMONIA" in the last 168 hours. Coagulation Profile: No results for input(s): "INR",  "PROTIME" in the last 168 hours. Cardiac Enzymes: Recent Labs  Lab 02/05/23 0330 02/08/23 0720  CKTOTAL 224 58   BNP (last 3 results) No results for input(s): "PROBNP" in the last 8760 hours. HbA1C: No results for input(s): "HGBA1C" in the last 72 hours. CBG: Recent Labs  Lab 02/10/23 1144 02/10/23 1627 02/10/23 2118 02/11/23 0630 02/11/23 1302  GLUCAP 106* 106* 112* 86 172*   Lipid Profile: No results for input(s): "CHOL", "HDL", "LDLCALC", "TRIG", "CHOLHDL", "LDLDIRECT" in the last 72 hours. Thyroid Function Tests: No results for input(s): "TSH", "T4TOTAL", "FREET4", "T3FREE", "THYROIDAB" in the last 72 hours. Anemia Panel: No results for input(s): "VITAMINB12", "FOLATE", "FERRITIN", "TIBC", "IRON", "RETICCTPCT" in the last 72 hours. Sepsis Labs: No results for input(s): "PROCALCITON", "LATICACIDVEN" in the last 168 hours.  No results found for this or any previous visit (from the past 240 hours).       Radiology Studies: [place] EKG SITE RITE Result Date: 02/10/2023 If Site Rite image not attached, placement could not be confirmed due to current cardiac rhythm.       Scheduled Meds:  acetaminophen  825 mg Oral TID   aspirin EC  81 mg Oral Daily   atorvastatin  80 mg Oral QHS   Chlorhexidine Gluconate Cloth  6 each Topical Daily   enoxaparin (LOVENOX) injection  40 mg Subcutaneous Q24H   gabapentin  600 mg Oral TID   insulin aspart  0-15 Units Subcutaneous TID WC   insulin aspart  0-5 Units Subcutaneous QHS   insulin glargine-yfgn  10 Units Subcutaneous QHS   metoprolol succinate  50 mg Oral Daily   nystatin   Topical TID   senna-docusate  1 tablet Oral Daily   sodium chloride flush  10-40 mL Intracatheter Q12H   Continuous Infusions:  DAPTOmycin 700 mg (02/10/23 1356)     LOS: 20 days    Time spent: 40 minutes    Dorcas Carrow, MD [facility]

## 2023-02-11 NOTE — Progress Notes (Signed)
Peripherally Inserted Central Catheter Placement  The IV Nurse has discussed with the patient and/or persons authorized to consent for the patient, the purpose of this procedure and the potential benefits and risks involved with this procedure.  The benefits include less needle sticks, lab draws from the catheter, and the patient may be discharged home with the catheter. Risks include, but not limited to, infection, bleeding, blood clot (thrombus formation), and puncture of an artery; nerve damage and irregular heartbeat and possibility to perform a PICC exchange if needed/ordered by physician.  Alternatives to this procedure were also discussed.  Bard Power PICC patient education guide, fact sheet on infection prevention and patient information card has been provided to patient /or left at bedside.    PICC Placement Documentation  PICC Single Lumen 02/11/23 Right Basilic 40 cm 2 cm (Active)  Indication for Insertion or Continuance of Line Home intravenous therapies (PICC only) 02/11/23 1246  Exposed Catheter (cm) 2 cm 02/11/23 1246  Site Assessment Clean, Dry, Intact 02/11/23 1246  Line Status Flushed;Saline locked;Blood return noted 02/11/23 1246  Dressing Type Transparent;Securing device 02/11/23 1246  Dressing Status Antimicrobial disc/dressing in place 02/11/23 1246  Line Adjustment (NICU/IV Team Only) No 02/11/23 1246  Dressing Change Due 02/18/23 02/11/23 1246       Romie Jumper 02/11/2023, 12:49 PM

## 2023-02-12 DIAGNOSIS — T8740 Infection of amputation stump, unspecified extremity: Secondary | ICD-10-CM | POA: Diagnosis not present

## 2023-02-12 LAB — GLUCOSE, CAPILLARY
Glucose-Capillary: 100 mg/dL — ABNORMAL HIGH (ref 70–99)
Glucose-Capillary: 142 mg/dL — ABNORMAL HIGH (ref 70–99)
Glucose-Capillary: 94 mg/dL (ref 70–99)
Glucose-Capillary: 126 mg/dL — ABNORMAL HIGH (ref 70–99)

## 2023-02-12 MED ORDER — DAPTOMYCIN IV (FOR PTA / DISCHARGE USE ONLY): 700.0000 mg | INTRAVENOUS | Status: DC

## 2023-02-12 MED ORDER — HYDROCODONE-ACETAMINOPHEN 5-325 MG PO TABS: 1.0000 | ORAL_TABLET | ORAL | 0 refills | Status: DC | PRN

## 2023-02-12 MED ORDER — HEPARIN SOD (PORK) LOCK FLUSH 100 UNIT/ML IV SOLN
250.0000 [IU] | INTRAVENOUS | Status: AC | PRN
  Administered 2023-02-12: 250 [IU]

## 2023-02-12 MED ORDER — ACETAMINOPHEN 325 MG PO TABS: 650.0000 mg | ORAL_TABLET | Freq: Four times a day (QID) | ORAL | Status: DC | PRN

## 2023-02-12 MED ORDER — POLYETHYLENE GLYCOL 3350 17 G PO PACK: 17.0000 g | PACK | Freq: Two times a day (BID) | ORAL | Status: DC

## 2023-02-12 MED ORDER — INSULIN GLARGINE-YFGN 100 UNIT/ML ~~LOC~~ SOLN: 10.0000 [IU] | Freq: Every day | SUBCUTANEOUS | Status: DC

## 2023-02-12 MED ORDER — POLYETHYLENE GLYCOL 3350 17 G PO PACK
17.0000 g | PACK | Freq: Two times a day (BID) | ORAL | Status: DC
  Administered 2023-02-12 (×2): 17 g via ORAL
  Filled 2023-02-12 (×2): qty 1

## 2023-02-12 MED ORDER — ALPRAZOLAM 0.5 MG PO TABS: 0.5000 mg | ORAL_TABLET | ORAL | 0 refills | Status: DC

## 2023-02-12 NOTE — Progress Notes (Signed)
Occupational Therapy Treatment Patient Details Name: James Dougherty MRN: [id number] DOB: [birth year] Today's Date: 02/12/2023   History of present illness 73 y.o. male [facility] 01[facility]22/23 from [facility] rehab for post-op evaluation of recent L AKA (11/15) w/ concern for infection and severe sepsis. S/p I&D of L AKA and application of wound vac 12/27. s/p I&D and wound vac change 12/30. s/p wound washout and revision of AKA 1/6. New Wound vac placed 1/12. Past medical history of arthritis, hyperlipidemia, hypertension, neuropathic pain, PAD, psoriasis, stroke, diabetes, CKD 3B, amputation R great toe 2019, R ICA stenosis   OT comments  Pt with gradual progression towards OT goals though remains limited by pain despite pain premedication. Pt briefly sat EOB with Max A but unable to sustain due to pain. Opted for bed level ADLs with pt able to manage UB ADLs with Min A and LB ADLs with Max A. Pt hopeful to DC back to rehab today and continue progressing independence.       If plan is discharge home, recommend the following:  Direct supervision/assist for medications management;Assist for transportation;Two people to help with walking and/or transfers;Two people to help with bathing/dressing/bathroom   Equipment Recommendations  None recommended by OT    Recommendations for Other Services      Precautions / Restrictions Precautions Precautions: Fall Precaution Comments: L AKA w/ wound vac Restrictions Weight Bearing Restrictions Per Provider Order: Yes LLE Weight Bearing Per Provider Order: Non weight bearing       Mobility Bed Mobility Overal bed mobility: Needs Assistance Bed Mobility: Rolling, Supine to Sit, Sit to Supine Rolling: Mod assist, Used rails   Supine to sit: Max assist, Used rails, HOB elevated   Sit to sidelying: Mod assist General bed mobility comments: Max A for bed mobility to sit EOB with use of bedrails though once up, pt with too much pain to continue sitting  EOB so opted to return to supine with Mod A. Mod A to roll to right side for ADL assist    Transfers                         Balance Overall balance assessment: Needs assistance Sitting-balance support: No upper extremity supported, Feet supported Sitting balance-Leahy Scale: Poor                                     ADL either performed or assessed with clinical judgement   ADL Overall ADL's : Needs assistance/impaired     Grooming: Set up;Bed level;Wash/dry face;Applying deodorant;Wash/dry hands   Upper Body Bathing: Minimal assistance;Bed level Upper Body Bathing Details (indicate cue type and reason): for back bed level Lower Body Bathing: Maximal assistance;Bed level   Upper Body Dressing : Set up;Bed level   Lower Body Dressing: Total assistance;Bed level                 General ADL Comments: Attempted EOB though too much pain noted with pt unable to gain balance. opted for ADLs bed level with pt able to return demo well    Extremity/Trunk Assessment Upper Extremity Assessment Upper Extremity Assessment: Right hand dominant;Generalized weakness   Lower Extremity Assessment Lower Extremity Assessment: Defer to PT evaluation        Vision   Vision Assessment?: No apparent visual deficits   Perception     Praxis  Cognition Arousal: Alert Behavior During Therapy: Anxious Overall Cognitive Status: Within Functional Limits for tasks assessed                                 General Comments: HOH, pt initially agitated by increased pain with bed mobility (despite pain premedication coordination with nursing) but with modification of tasks to minimize pain, pt pleasant, showing insight into deficits and needs, following directions consistently        Exercises      Shoulder Instructions       General Comments      Pertinent Vitals/ Pain       Pain Assessment Pain Assessment: Faces Faces Pain Scale: Hurts  whole lot Pain Location: L residual limb with bed mobility Pain Descriptors / Indicators: Moaning, Grimacing, Discomfort, Aching Pain Intervention(s): Monitored during session, Limited activity within patient's tolerance, Premedicated before session  Home Living                                          Prior Functioning/Environment              Frequency  Min 1X/week        Progress Toward Goals  OT Goals(current goals can now be found in the care plan section)  Progress towards OT goals: Progressing toward goals  Acute Rehab OT Goals Patient Stated Goal: decrease pain OT Goal Formulation: With patient Time For Goal Achievement: 02/12/23 Potential to Achieve Goals: Fair ADL Goals Pt Will Perform Upper Body Dressing: with set-up Pt Will Perform Lower Body Dressing: with set-up;with supervision;with adaptive equipment Pt Will Transfer to Toilet: with contact guard assist;with transfer board Pt Will Perform Toileting - Clothing Manipulation and hygiene: with set-up;with supervision Additional ADL Goal #1: Pt will be able to perform bed mobility with supervision to prepare for transfers and ADLs to maximize independence and return to PLOF.  Plan      Co-evaluation                 AM-PAC OT "6 Clicks" Daily Activity     Outcome Measure   Help from another person eating meals?: A Little Help from another person taking care of personal grooming?: A Little Help from another person toileting, which includes using toliet, bedpan, or urinal?: Total Help from another person bathing (including washing, rinsing, drying)?: Total Help from another person to put on and taking off regular upper body clothing?: A Lot Help from another person to put on and taking off regular lower body clothing?: Total 6 Click Score: 11    End of Session    OT Visit Diagnosis: Unsteadiness on feet (R26.81);Other abnormalities of gait and mobility (R26.89);Muscle weakness  (generalized) (M62.81);Pain Pain - Right/Left: Left Pain - part of body: Leg   Activity Tolerance Patient limited by pain   Patient Left in bed;with call bell/phone within reach;with bed alarm set   Nurse Communication Mobility status        Time: 5784-6962 OT Time Calculation (min): 34 min  Charges: OT General Charges $OT Visit: 1 Visit OT Treatments $Self Care/Home Management : 23-37 mins  Bradd Canary, OTR/L Acute Rehab Services Office: [phone]   Lorre Munroe 02/12/2023, 11:57 AM

## 2023-02-12 NOTE — Progress Notes (Signed)
Received report from [place], Charity fundraiser. Patient is a&o x4. Patient is on RA, satting >90%. Patient has wound vac on left lower extremity with intact dressing set to 125 suction.   Bed in lowest setting with 3/4 bedrails up. Call button within reach and patient demonstrates proper knowledge and use of call button. Patient educated on fall risk and verbalizes understanding. 4Ps addressed and patient rates a 0/10 pain level.

## 2023-02-12 NOTE — Discharge Summary (Signed)
Physician Discharge Summary  James Dougherty NWG:[id number] DOB: [birth year] DOA: 01/22/2023  PCP: James Pandy, MD  Admit date: 01/22/2023 Discharge date: 02/12/2023  Admitted From: Skilled nursing facility Disposition: Skilled nursing facility  Recommendations for Outpatient Follow-up:  Follow up with PCP in 1-2 weeks Please obtain CMP/CBC/CK every week and to communicate with ID team.  Home Health: N/A Equipment/Devices: N/A  Discharge Condition: Stable CODE STATUS: Full code Diet recommendation: Low-salt, low-carb diet.  Nutritional supplements  Discharge summary: 73 year old with history of obesity, type 2 diabetes on insulin, hypertension, history of stroke, CKD, lymphedema.  Recent multiple hospitalizations.  Currently at [facility] since September 2024.  Recent admission for left extremity wound, with below knee amputation.  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  Wound culture with MRSA.  Underwent above-knee amputation and wound VAC placement 12/27.  Prolonged hospitalization secondary to multiple surgical procedures, pain control and wound VAC management.  He is able to go back to SNF with wound VAC management and IV antibiotics.   Assessment & plan of care:   Severe peripheral vascular disease, diabetic and vascular insufficiency ulcer with left foot infection: Multiple procedures and ultimately left above-knee amputation. Blood cultures negative. Wound cultures with MRSA from 12/27. On different antibiotics but now remains on daptomycin. End date 03/08/2023.  Patient is on aspirin and atorvastatin.   Adequate pain medications.   Discharging with PICC line.  Remove PICC line at end of antibiotic therapy. Will need additional premedication on wound VAC changes. Wound care instructions, wound VAC change every Monday and Thursday.  Premedicate with oxycodone 10 mg and Xanax 0.5 mg 1 hour before wound VAC changes.   Chronic combined  heart failure/hypertension: Currently stable on metoprolol.   Type 2 diabetes, uncontrolled with peripheral neuropathy: A1c 8.4.  On long-acting and short acting insulin.  Reduced dose of long-acting insulin.   Multiple chronic issues including Anemia of chronic disease, stable History of stroke and hyperlipidemia: Stable.  Currently on aspirin and statins. Constipation, keep on a scheduled stool softener and laxative.  Stable to transfer to SNF level of care.    Discharge Diagnoses:  Principal Problem:   Amputation stump infection (HCC) Active Problems:   Peripheral vascular disease, unspecified (HCC)   Chronic kidney disease   Type II diabetes mellitus with complication (HCC)   Neuropathic pain   S/P AKA (above knee amputation) unilateral (HCC)   Palliative care by specialist   Pain of amputation stump of left lower extremity [facility])    Discharge Instructions  Discharge Instructions     [facility] pharmacist to adjust dose for Vancomycin, Aminoglycosides and other anti-infective therapies as requested by physician.   Complete by: As directed    [facility] to provide Cath Flo 2mg    Complete by: As directed    Administer for PICC line occlusion and as ordered by physician for other access device issues.   Anaphylaxis Kit: Provided to treat any anaphylactic reaction to the medication being provided to the patient if First Dose or when requested by physician   Complete by: As directed    Epinephrine 1mg /ml vial / amp: Administer 0.3mg  (0.37ml) subcutaneously once for moderate to severe anaphylaxis, nurse to call physician and pharmacy when reaction occurs and call 911 if needed for immediate care   Diphenhydramine 50mg /ml IV vial: Administer 25-50mg  IV/IM PRN for first dose reaction, rash, itching, mild reaction, nurse to call physician and pharmacy when reaction occurs  Sodium Chloride 0.9% NS IV: Administer if needed for hypovolemic blood pressure  drop or as ordered by physician after call to physician with anaphylactic reaction   Change dressing on IV access line weekly and PRN   Complete by: As directed    Diet - low sodium heart healthy   Complete by: As directed    Discharge wound care:   Complete by: As directed    Negative pressure wound therapy  Do not change dressing      Amount of suction? 125 mm/Hg   Suction Type? Continuous  Wound Vac changes Monday and Thursday, pre  medicate before changes   Flush IV access with Sodium Chloride 0.9% and Heparin 10 units/ml or 100 units/ml   Complete by: As directed    Home infusion instructions - [facility]   Complete by: As directed    Instructions: Flush IV access with Sodium Chloride 0.9% and Heparin 10units/ml or 100units/ml   Change dressing on IV access line: Weekly and PRN   Instructions Cath Flo 2mg : Administer for PICC Line occlusion and as ordered by physician for other access device   [facility] pharmacist to adjust dose for: Vancomycin, Aminoglycosides and other anti-infective therapies as requested by physician   Increase activity slowly   Complete by: As directed    Method of administration may be changed at the discretion of home infusion pharmacist based upon assessment of the patient and/or caregiver's ability to self-administer the medication ordered   Complete by: As directed       Allergies as of 02/12/2023       Reactions   Oxycodone Nausea And Vomiting   Glipizide    Bad headache and blurred vision   Other    Soft silicone dressing caused wound to get worse   Bactrim [sulfamethoxazole-trimethoprim] Itching, Rash        Medication List     STOP taking these medications    Basaglar KwikPen 100 UNIT/ML   CALMOSEPTINE EX   clopidogrel 75 MG tablet Commonly known as: Plavix   empagliflozin 10 MG Tabs tablet Commonly known as: JARDIANCE   furosemide 20 MG tablet Commonly known as: LASIX   MENTHOL-ZINC OXIDE EX    traMADol 50 MG tablet Commonly known as: ULTRAM       TAKE these medications    acetaminophen 325 MG tablet Commonly known as: TYLENOL Take 2 tablets (650 mg total) by mouth every 6 (six) hours as needed for mild pain (pain score 1-3) (or Fever >/= 101).   ALPRAZolam 0.5 MG tablet Commonly known as: Xanax Take 1 tablet (0.5 mg total) by mouth 2 (two) times a week for 6 doses. 1 hour before dressing changes on Monday and Thursday Start taking on: February 15, 2023   aspirin EC 81 MG tablet Take 81 mg by mouth daily. Swallow whole.   atorvastatin 80 MG tablet Commonly known as: LIPITOR Take 80 mg by mouth at bedtime.   bisacodyl 5 MG EC tablet Commonly known as: DULCOLAX Take 5 mg by mouth daily as needed for moderate constipation.   daptomycin IVPB Commonly known as: CUBICIN Inject 700 mg into the vein daily. Indication:  L AKA stump infection / osteomyelitis First Dose: Yes Last Day of Therapy:  03/08/23 Labs - Once weekly:  CBC/D, BMP, and CPK Labs - Once weekly: ESR and CRP Method of administration: IV Push Method of administration may be changed at the discretion of home infusion pharmacist based upon assessment of  the patient and/or caregiver's ability to self-administer the medication ordered.   gabapentin 300 MG capsule Commonly known as: NEURONTIN Take 600 mg by mouth 3 (three) times daily.   HYDROcodone-acetaminophen 5-325 MG tablet Commonly known as: NORCO/VICODIN Take 1-2 tablets by mouth every 4 (four) hours as needed for moderate pain (pain score 4-6) or severe pain (pain score 7-10) (1 tablet for regular pain, 2 tablets 1 hour before dressing changes on Monday and Thursdays).   insulin aspart 100 UNIT/ML injection Commonly known as: novoLOG Inject 0-15 Units into the skin 3 (three) times daily with meals.   insulin aspart 100 UNIT/ML injection Commonly known as: novoLOG Inject 0-5 Units into the skin at bedtime.   insulin glargine-yfgn 100 UNIT/ML  injection Commonly known as: SEMGLEE Inject 0.1 mLs (10 Units total) into the skin at bedtime.   Melatonin 3 MG Caps Take 3 mg by mouth at bedtime as needed (sleep).   metoprolol succinate 50 MG 24 hr tablet Commonly known as: TOPROL-XL Take 50 mg by mouth daily. Take with or immediately following a meal.   polyethylene glycol 17 g packet Commonly known as: MIRALAX / GLYCOLAX Take 17 g by mouth daily as needed. What changed: Another medication with the same name was added. Make sure you understand how and when to take each.   polyethylene glycol 17 g packet Commonly known as: MIRALAX / GLYCOLAX Take 17 g by mouth 2 (two) times daily. What changed: You were already taking a medication with the same name, and this prescription was added. Make sure you understand how and when to take each.   Protein Powd Take 6 g by mouth in the morning, at noon, and at bedtime.   senna-docusate 8.6-50 MG tablet Commonly known as: Senokot-S Take 1 tablet by mouth daily.   Trulicity 1.5 MG/0.5ML Soaj Generic drug: Dulaglutide Inject 1.5 mg into the skin once a week. Takes weekly on Saturday               Discharge Care Instructions  (From admission, onward)           Start     Ordered   02/12/23 0000  Change dressing on IV access line weekly and PRN  (Home infusion instructions - [facility] )        02/12/23 0734   02/12/23 0000  Discharge wound care:       Comments: Negative pressure wound therapy  Do not change dressing      Amount of suction? 125 mm/Hg   Suction Type? Continuous  Wound Vac changes Monday and Thursday, pre  medicate before changes   02/12/23 1033            Allergies  Allergen Reactions   Oxycodone Nausea And Vomiting   Glipizide     Bad headache and blurred vision   Other     Soft silicone dressing caused wound to get worse   Bactrim [Sulfamethoxazole-Trimethoprim] Itching and Rash    Consultations: Vascular  surgery ID   Procedures/Studies: [place] EKG SITE RITE Result Date: 02/10/2023 If Site Rite image not attached, placement could not be confirmed due to current cardiac rhythm.  (Echo, Carotid, EGD, Colonoscopy, ERCP)    Subjective: Seen in the morning rounds.  Without complaints today.  He will need something for constipation.  He is little bit afraid about pain while wound VAC changes but instructed to use medications before the procedure.  Agreeable to transfer to SNF today.   Discharge Exam:  Vitals:   02/12/23 0325 02/12/23 0913  BP:  139/68  Pulse: 76 85  Resp: 12 15  Temp: (!) 97 F (36.1 C) (!) 97.5 F (36.4 C)  SpO2: 96% 100%   Vitals:   02/11/23 1942 02/11/23 2247 02/12/23 0325 02/12/23 0913  BP: (!) 91/48 (!) 110/42  139/68  Pulse: 67 70 76 85  Resp: 16 12 12 15   Temp: 97.7 F (36.5 C) 97.6 F (36.4 C) (!) 97 F (36.1 C) (!) 97.5 F (36.4 C)  TempSrc: Oral Oral Axillary Oral  SpO2: 99% 99% 96% 100%  Weight:      Height:        General: Pt is alert, awake, not in acute distress Hard of hearing.  Chronically sick looking.  Not in any distress. Cardiovascular: RRR, S1/S2 +, no rubs, no gallops Respiratory: CTA bilaterally, no wheezing, no rhonchi Abdominal: Soft, NT, ND, bowel sounds + Extremities: no edema, no cyanosis Left AKA stump with wound VAC, mild redness around the wound VAC.  No induration or fluctuation.    The results of significant diagnostics from this hospitalization (including imaging, microbiology, ancillary and laboratory) are listed below for reference.     Microbiology: No results found for this or any previous visit (from the past 240 hours).   Labs: BNP (last 3 results) No results for input(s): "BNP" in the last 8760 hours. Basic Metabolic Panel: Recent Labs  Lab 02/11/23 0330  NA 137  K 4.2  CL 105  CO2 24  GLUCOSE 98  BUN 10  CREATININE 0.87  CALCIUM 8.1*   Liver Function Tests: No results for input(s): "AST", "ALT",  "ALKPHOS", "BILITOT", "PROT", "ALBUMIN" in the last 168 hours. No results for input(s): "LIPASE", "AMYLASE" in the last 168 hours. No results for input(s): "AMMONIA" in the last 168 hours. CBC: No results for input(s): "WBC", "NEUTROABS", "HGB", "HCT", "MCV", "PLT" in the last 168 hours. Cardiac Enzymes: Recent Labs  Lab 02/08/23 0720  CKTOTAL 58   BNP: Invalid input(s): "POCBNP" CBG: Recent Labs  Lab 02/11/23 0630 02/11/23 1302 02/11/23 1614 02/11/23 2112 02/12/23 0616  GLUCAP 86 172* 132* 121* 100*   D-Dimer No results for input(s): "DDIMER" in the last 72 hours. Hgb A1c No results for input(s): "HGBA1C" in the last 72 hours. Lipid Profile No results for input(s): "CHOL", "HDL", "LDLCALC", "TRIG", "CHOLHDL", "LDLDIRECT" in the last 72 hours. Thyroid function studies No results for input(s): "TSH", "T4TOTAL", "T3FREE", "THYROIDAB" in the last 72 hours.  Invalid input(s): "FREET3" Anemia work up No results for input(s): "VITAMINB12", "FOLATE", "FERRITIN", "TIBC", "IRON", "RETICCTPCT" in the last 72 hours. Urinalysis    Component Value Date/Time   COLORURINE YELLOW 10/28/2021 2206   APPEARANCEUR CLEAR 10/28/2021 2206   LABSPEC 1.015 10/28/2021 2206   PHURINE 7.0 10/28/2021 2206   GLUCOSEU 50 (A) 10/28/2021 2206   HGBUR NEGATIVE 10/28/2021 2206   BILIRUBINUR NEGATIVE 10/28/2021 2206   KETONESUR 20 (A) 10/28/2021 2206   PROTEINUR NEGATIVE 10/28/2021 2206   NITRITE NEGATIVE 10/28/2021 2206   LEUKOCYTESUR NEGATIVE 10/28/2021 2206   Sepsis Labs No results for input(s): "WBC" in the last 168 hours.  Invalid input(s): "PROCALCITONIN", "LACTICIDVEN" Microbiology No results found for this or any previous visit (from the past 240 hours).   Time coordinating discharge: 35 minutes  SIGNED:   Dorcas Carrow, MD  [facility] 02/12/2023, 10:34 AM

## 2023-02-12 NOTE — Progress Notes (Signed)
  Progress Note    02/12/2023 8:04 AM 11 Days Post-Op  Subjective:  was sleeping comfortably but woke him up. Says pain currently manageable   Vitals:   02/11/23 2247 02/12/23 0325  BP: (!) 110/42   Pulse: 70 76  Resp: 12 12  Temp: 97.6 F (36.4 C) (!) 97 F (36.1 C)  SpO2: 99% 96%   Physical Exam: Cardiac:  regular Lungs:  non labored Incisions:  left AKA with vac in place, good seal Neurologic: alert and oriented  CBC    Component Value Date/Time   WBC 12.4 (H) 02/02/2023 0827   RBC 3.53 (L) 02/02/2023 0827   HGB 11.1 (L) 02/02/2023 0827   HCT 35.2 (L) 02/02/2023 0827   PLT 336 02/02/2023 0827   MCV 99.7 02/02/2023 0827   MCH 31.4 02/02/2023 0827   MCHC 31.5 02/02/2023 0827   RDW 15.2 02/02/2023 0827   LYMPHSABS 2.4 02/01/2023 0459   MONOABS 0.7 02/01/2023 0459   EOSABS 0.7 (H) 02/01/2023 0459   BASOSABS 0.1 02/01/2023 0459    BMET    Component Value Date/Time   NA 137 02/11/2023 0330   K 4.2 02/11/2023 0330   CL 105 02/11/2023 0330   CO2 24 02/11/2023 0330   GLUCOSE 98 02/11/2023 0330   BUN 10 02/11/2023 0330   CREATININE 0.87 02/11/2023 0330   CALCIUM 8.1 (L) 02/11/2023 0330   GFRNONAA >60 02/11/2023 0330   GFRAA 47 (L) 04/28/2017 0238    INR    Component Value Date/Time   INR 1.2 12/11/2022 1105     Intake/Output Summary (Last 24 hours) at 02/12/2023 0804 Last data filed at 02/12/2023 0730 Gross per 24 hour  Intake 1010 ml  Output 1050 ml  Net -40 ml     Assessment/Plan:  73 y.o. male is s/p I&D left AKA and wound vac 01/22/2023 and simple partial closure of left AKA and wound vac placement 01/25/2023 both by Dr. Lenell Antu and left AKA revision with myriad placement by Dr. Mingo Amber changed yesterday. Overall well appearing Continues to have significant pain in stump/ around incision Will need 2x weekly VAC changes at d/c Definitely will need pre medicated for these VAC changes in order to tolerate From vascular standpoint okay for  d/c to SNF once insurance auth approved He already has follow up arranged on 2/13 in our office Will continue to follow while in house   Graceann Congress, [place] [facility] [phone] 02/12/2023 8:04 AM

## 2023-02-12 NOTE — TOC Transition Note (Signed)
Transition of Care [facility]) - Discharge Note   Patient Details  Name: James Dougherty MRN: [id number] Date of Birth: 1950/09/15  Transition of Care [facility]) CM/SW Contact:  Eduard Roux, LCSW Phone Number: 02/12/2023, 12:26 PM   Clinical Narrative:     Patient will Discharge to: [facility] Discharge Date: 02/12/2023 Family Notified: daughter Transport By: Sharin Mons  Per MD patient is ready for discharge. RN, patient, and facility notified of discharge. Discharge Summary sent to facility. RN given number for [phone]. Ambulance transport requested for patient.   Clinical Social Worker signing off.   Final next level of care: Skilled Nursing Facility Barriers to Discharge: Barriers Resolved   Patient Goals and CMS Choice Patient states their goals for this hospitalization and ongoing recovery are:: SNF   Choice offered to / list presented to : Adult Children (daughter Kara Mead)      Discharge Placement              Patient chooses bed at: [facility] Patient to be transferred to facility by: [facility] Name of family member notified: daughter Patient and family notified of of transfer: 02/12/23  Discharge Plan and Services Additional resources added to the After Visit Summary for   In-house Referral: Clinical Social Work                                   Social Drivers of Health (SDOH) Interventions SDOH Screenings   Food Insecurity: No Food Insecurity (01/22/2023)  Housing: Unknown (01/22/2023)  Transportation Needs: No Transportation Needs (01/22/2023)  Utilities: Not At Risk (01/22/2023)  Depression (PHQ2-9): Low Risk  (12/03/2021)  Financial Resource Strain: Patient Declined (10/20/2022)   Received from [facility]  Physical Activity: Inactive (10/20/2022)   Received from [facility]  Social Connections: Unknown (02/01/2023)  Stress: No Stress Concern Present (10/20/2022)   Received from [facility]  Tobacco Use: Medium Risk  (02/01/2023)  Health Literacy: Medium Risk (12/23/2022)   Received from [facility]     Readmission Risk Interventions     No data to display

## 2023-02-15 NOTE — Progress Notes (Deleted)
Subjective:    Patient ID: James Dougherty, male    DOB: 1950-10-17, 73 y.o.   MRN: [id number]  HPI  73 y.o. male DM2, HTN, HLD, prior CVA, HOH, PAD, right ICA stenosis, CKD, neuropathy, lymphedema comes into the hospital with infected left AKA stump wound. Recently hospitalized 11/15 - 11/22, for nonhealing left lower extremity wound, underwent left AKA and subsequent discharged to subacute rehab.    Continue to suffer some drainage the medial lateral aspects.  He was not however systemically ill on the 27th he was sent to skilled facility to the ER with drainage from the stump low-grade temperature.  Taken the operating room and underwent I&D of abscess without clear-cut bone involvement.   MRSA is unfortunately resistant to tetracycline sensitive to Bactrim to which she is allergic.   [facility] would like him to have 6 weeks of postoperative antibiotics --would presume due to concerns that bone could be involved.   Past Medical History:  Diagnosis Date   Arthritis    "hands" (12/27/2012)   Chronic kidney disease    stage 3, in [place]   Depression 10/26/2022   in [place]   High cholesterol    Hypertension    Neuromuscular disorder (HCC)    feet neuropathy   Neuropathic pain    PAD (peripheral artery disease) (HCC)    Psoriasis    Stroke (HCC) 1999   "still have some speech problems at times; sometimes forget what I was going to say" (12/27/2012)   Type II diabetes mellitus (HCC)    Ulcer    Left ankle/leg   Walker as ambulation aid 12/02/2021   or wheelchair    Past Surgical History:  Procedure Laterality Date   ABDOMINAL AORTAGRAM N/A 03/30/2012   Procedure: ABDOMINAL Ronny Flurry;  Surgeon: Nada Libman, MD;  Location: [facility] CATH LAB;  Service: Cardiovascular;  Laterality: N/A;   ABDOMINAL AORTAGRAM N/A 12/27/2012   Procedure: ABDOMINAL Ronny Flurry;  Surgeon: Nada Libman, MD;  Location: [facility] CATH LAB;  Service: Cardiovascular;  Laterality: N/A;   ABDOMINAL AORTOGRAM W/LOWER EXTREMITY N/A  04/05/2017   Procedure: ABDOMINAL AORTOGRAM W/LOWER EXTREMITY;  Surgeon: Sherren Kerns, MD;  Location: [facility] INVASIVE CV LAB;  Service: Cardiovascular;  Laterality: N/A;   ABDOMINAL AORTOGRAM W/LOWER EXTREMITY N/A 09/30/2022   Procedure: ABDOMINAL AORTOGRAM W/LOWER EXTREMITY;  Surgeon: Victorino Sparrow, MD;  Location: [facility] INVASIVE CV LAB;  Service: Cardiovascular;  Laterality: N/A;   AMPUTATION Right 04/26/2017   Procedure: AMPUTATION TRANSMETATARSAL RIGHT GREAT TOE;  Surgeon: Larina Earthly, MD;  Location: [facility] OR;  Service: Vascular;  Laterality: Right;   AMPUTATION Left 12/11/2022   Procedure: AMPUTATION ABOVE KNEE;  Surgeon: Victorino Sparrow, MD;  Location: [facility] OR;  Service: Vascular;  Laterality: Left;   AMPUTATION Left 01/22/2023   Procedure: ABOVE KNEE AMPUTATION WASHOUT;  Surgeon: Leonie Douglas, MD;  Location: [facility] OR;  Service: Vascular;  Laterality: Left;   ANGIOPLASTY / STENTING FEMORAL Left 12/27/2012   APPLICATION OF WOUND VAC Right 04/26/2017   Procedure: APPLICATION OF WOUND VAC;  Surgeon: Larina Earthly, MD;  Location: [facility] OR;  Service: Vascular;  Laterality: Right;   APPLICATION OF WOUND VAC Left 01/22/2023   Procedure: APPLICATION OF WOUND VAC;  Surgeon: Leonie Douglas, MD;  Location: [facility] OR;  Service: Vascular;  Laterality: Left;   APPLICATION OF WOUND VAC Left 01/25/2023   Procedure: APPLICATION OF WOUND VAC;  Surgeon: Leonie Douglas, MD;  Location: [facility] OR;  Service: Vascular;  Laterality: Left;   DRESSING CHANGE UNDER ANESTHESIA Left 02/01/2023   Procedure: WOUND VAC DRESSING CHANGE;  Surgeon: Victorino Sparrow, MD;  Location: [facility] OR;  Service: Vascular;  Laterality: Left;   FEMORAL ARTERY STENT  03/30/2012   I & D EXTREMITY Left 01/25/2023   Procedure: IRRIGATION AND DEBRIDEMENT AKA;  Surgeon: Leonie Douglas, MD;  Location: [facility] OR;  Service: Vascular;  Laterality: Left;   I & D EXTREMITY Left 02/01/2023   Procedure: IRRIGATION AND DEBRIDMENT LEFT ABOVE KNEE AMPUTATION;  Surgeon: Victorino Sparrow, MD;  Location: [facility] OR;  Service: Vascular;  Laterality: Left;   LOWER EXTREMITY ANGIOGRAM  12/27/2012   Procedure: LOWER EXTREMITY ANGIOGRAM;  Surgeon: Nada Libman, MD;  Location: [facility] CATH LAB;  Service: Cardiovascular;;   LOWER EXTREMITY ANGIOGRAPHY N/A 04/19/2018   Procedure: LOWER EXTREMITY ANGIOGRAPHY;  Surgeon: Nada Libman, MD;  Location: [facility] INVASIVE CV LAB;  Service: Cardiovascular;  Laterality: N/A;   PERIPHERAL VASCULAR ATHERECTOMY Right 04/05/2017   Procedure: PERIPHERAL VASCULAR ATHERECTOMY;  Surgeon: Sherren Kerns, MD;  Location: [facility] INVASIVE CV LAB;  Service: Cardiovascular;  Laterality: Right;  superficial femoral   PERIPHERAL VASCULAR BALLOON ANGIOPLASTY  04/19/2018   Procedure: PERIPHERAL VASCULAR BALLOON ANGIOPLASTY;  Surgeon: Nada Libman, MD;  Location: [facility] INVASIVE CV LAB;  Service: Cardiovascular;;   PERIPHERAL VASCULAR BALLOON ANGIOPLASTY Left 09/30/2022   Procedure: PERIPHERAL VASCULAR BALLOON ANGIOPLASTY;  Surgeon: Victorino Sparrow, MD;  Location: [facility] INVASIVE CV LAB;  Service: Cardiovascular;  Laterality: Left;  SFA, Pop, TP trunk & Rt Ext Iliac   PERIPHERAL VASCULAR INTERVENTION Right 04/05/2017   Procedure: PERIPHERAL VASCULAR INTERVENTION;  Surgeon: Sherren Kerns, MD;  Location: [facility] INVASIVE CV LAB;  Service: Cardiovascular;  Laterality: Right;   Superficial femorl and external iliac   POPLITEAL ARTERY STENT     STUMP REVISION Left 02/01/2023   Procedure: AMPUTATION REVISION;  Surgeon: Victorino Sparrow, MD;  Location: [facility] OR;  Service: Vascular;  Laterality: Left;   TONSILLECTOMY AND ADENOIDECTOMY      Family History  Problem Relation Age of Onset   Diabetes Sister        Bilateral amputation of lower legs   Diabetes Sister    Heart disease Sister 25       Heart disease before age 35   Hypertension Sister    Heart attack Sister    Hyperlipidemia Sister    Hypertension Father    Hyperlipidemia Father    Hyperlipidemia Mother    Hypertension  Mother    Hypertension Daughter       Social History   Socioeconomic History   Marital status: Single    Spouse name: Not on file   Number of children: Not on file   Years of education: Not on file   Highest education level: Not on file  Occupational History   Not on file  Tobacco Use   Smoking status: Former    Current packs/day: 0.00    Average packs/day: 3.0 packs/day for 45.0 years (135.0 ttl pk-yrs)    Types: Cigarettes    Start date: 01/06/1966    Quit date: 01/07/2011    Years since quitting: 12.1    Passive exposure: Never   Smokeless tobacco: Never  Vaping Use   Vaping status: Never Used  Substance and Sexual Activity   Alcohol use: Not Currently    Comment: 12/27/2012 "couple times/yr I might have a drink"   Drug use: No   Sexual activity: Not  Currently  Other Topics Concern   Not on file  Social History Narrative   Not on file   Social Drivers of Health   Financial Resource Strain: Patient Declined (10/20/2022)   Received from [facility]   Overall Financial Resource Strain (CARDIA)    Difficulty of Paying Living Expenses: Patient declined  Food Insecurity: No Food Insecurity (01/22/2023)   Hunger Vital Sign    Worried About Running Out of Food in the Last Year: Never true    Ran Out of Food in the Last Year: Never true  Transportation Needs: No Transportation Needs (01/22/2023)   PRAPARE - Administrator, Civil Service (Medical): No    Lack of Transportation (Non-Medical): No  Physical Activity: Inactive (10/20/2022)   Received from [facility]   Exercise Vital Sign    Days of Exercise per Week: 0 days    Minutes of Exercise per Session: 0 min  Stress: No Stress Concern Present (10/20/2022)   Received from [facility] - Occupational Stress Questionnaire    Feeling of Stress : Not at all  Social Connections: Unknown (02/01/2023)   Social Connection and Isolation Panel [NHANES]     Frequency of Communication with Friends and Family: Patient declined    Frequency of Social Gatherings with Friends and Family: Patient declined    Attends Religious Services: Patient declined    Database administrator or Organizations: Patient declined    Attends Banker Meetings: Never    Marital Status: Patient declined    Allergies  Allergen Reactions   Oxycodone Nausea And Vomiting   Glipizide     Bad headache and blurred vision   Other     Soft silicone dressing caused wound to get worse   Bactrim [Sulfamethoxazole-Trimethoprim] Itching and Rash     Current Outpatient Medications:    acetaminophen (TYLENOL) 325 MG tablet, Take 2 tablets (650 mg total) by mouth every 6 (six) hours as needed for mild pain (pain score 1-3) (or Fever >/= 101)., Disp: , Rfl:    ALPRAZolam (XANAX) 0.5 MG tablet, Take 1 tablet (0.5 mg total) by mouth 2 (two) times a week for 6 doses. 1 hour before dressing changes on Monday and Thursday, Disp: 6 tablet, Rfl: 0   aspirin EC 81 MG tablet, Take 81 mg by mouth daily. Swallow whole., Disp: , Rfl:    atorvastatin (LIPITOR) 80 MG tablet, Take 80 mg by mouth at bedtime., Disp: , Rfl:    bisacodyl (DULCOLAX) 5 MG EC tablet, Take 5 mg by mouth daily as needed for moderate constipation. (Patient not taking: Reported on 01/22/2023), Disp: , Rfl:    daptomycin (CUBICIN) IVPB, Inject 700 mg into the vein daily. Indication:  L AKA stump infection / osteomyelitis First Dose: Yes Last Day of Therapy:  03/08/23 Labs - Once weekly:  CBC/D, BMP, and CPK Labs - Once weekly: ESR and CRP Method of administration: IV Push Method of administration may be changed at the discretion of home infusion pharmacist based upon assessment of the patient and/or caregiver's ability to self-administer the medication ordered., Disp: , Rfl:    Dulaglutide (TRULICITY) 1.5 MG/0.5ML SOAJ, Inject 1.5 mg into the skin once a week. Takes weekly on Saturday (Patient not taking: Reported on  01/22/2023), Disp: , Rfl:    gabapentin (NEURONTIN) 300 MG capsule, Take 600 mg by mouth 3 (three) times daily., Disp: , Rfl:    HYDROcodone-acetaminophen (NORCO/VICODIN)  5-325 MG tablet, Take 1-2 tablets by mouth every 4 (four) hours as needed for moderate pain (pain score 4-6) or severe pain (pain score 7-10) (1 tablet for regular pain, 2 tablets 1 hour before dressing changes on Monday and Thursdays)., Disp: 30 tablet, Rfl: 0   insulin aspart (NOVOLOG) 100 UNIT/ML injection, Inject 0-15 Units into the skin 3 (three) times daily with meals., Disp: , Rfl:    insulin aspart (NOVOLOG) 100 UNIT/ML injection, Inject 0-5 Units into the skin at bedtime., Disp: , Rfl:    insulin glargine-yfgn (SEMGLEE) 100 UNIT/ML injection, Inject 0.1 mLs (10 Units total) into the skin at bedtime., Disp: , Rfl:    Melatonin 3 MG CAPS, Take 3 mg by mouth at bedtime as needed (sleep). (Patient not taking: Reported on 01/22/2023), Disp: , Rfl:    metoprolol succinate (TOPROL-XL) 50 MG 24 hr tablet, Take 50 mg by mouth daily. Take with or immediately following a meal., Disp: , Rfl:    polyethylene glycol (MIRALAX / GLYCOLAX) 17 g packet, Take 17 g by mouth daily as needed. (Patient not taking: Reported on 01/22/2023), Disp: , Rfl:    polyethylene glycol (MIRALAX / GLYCOLAX) 17 g packet, Take 17 g by mouth 2 (two) times daily., Disp: , Rfl:    Protein POWD, Take 6 g by mouth in the morning, at noon, and at bedtime., Disp: , Rfl:    senna-docusate (SENOKOT-S) 8.6-50 MG tablet, Take 1 tablet by mouth daily., Disp: , Rfl:    Review of Systems     Objective:   Physical Exam        Assessment & Plan:

## 2023-02-16 ENCOUNTER — Ambulatory Visit: Payer: 59 | Admitting: Infectious Disease

## 2023-02-16 ENCOUNTER — Telehealth: Payer: Self-pay

## 2023-02-16 DIAGNOSIS — Z89612 Acquired absence of left leg above knee: Secondary | ICD-10-CM

## 2023-02-16 DIAGNOSIS — T8740 Infection of amputation stump, unspecified extremity: Secondary | ICD-10-CM

## 2023-02-16 DIAGNOSIS — I739 Peripheral vascular disease, unspecified: Secondary | ICD-10-CM

## 2023-02-16 DIAGNOSIS — I7025 Atherosclerosis of native arteries of other extremities with ulceration: Secondary | ICD-10-CM

## 2023-02-16 DIAGNOSIS — I96 Gangrene, not elsewhere classified: Secondary | ICD-10-CM

## 2023-02-16 DIAGNOSIS — E118 Type 2 diabetes mellitus with unspecified complications: Secondary | ICD-10-CM

## 2023-02-16 NOTE — Telephone Encounter (Signed)
Called [facility] to request that patient's lab work be sent with him to today's appointment, no answer and unable to leave message for nursing staff.   [phone]   Sandie Ano, RN

## 2023-02-17 ENCOUNTER — Encounter (HOSPITAL_COMMUNITY): Payer: Self-pay

## 2023-02-18 ENCOUNTER — Ambulatory Visit: Payer: 59 | Admitting: Urology

## 2023-02-27 DEATH — deceased

## 2023-03-05 ENCOUNTER — Ambulatory Visit: Payer: 59 | Admitting: Internal Medicine

## 2023-03-11 ENCOUNTER — Ambulatory Visit: Payer: Medicare Other | Admitting: Vascular Surgery

## 2023-03-16 ENCOUNTER — Ambulatory Visit: Payer: 59 | Admitting: Infectious Disease

## 2023-06-27 IMAGING — CT CT ANGIO NECK
2 of 7 series · 8 of 33 positions shown · non-contrast
Comparison: No prior CTA

CLINICAL DATA: Carotid artery stenosis surveillance



[Series 4: cta neck · axial · 0.56mm/px · z∈[-108,-28]mm · 2 of 121 slices shown]
[im 41/121  soft-tissue]
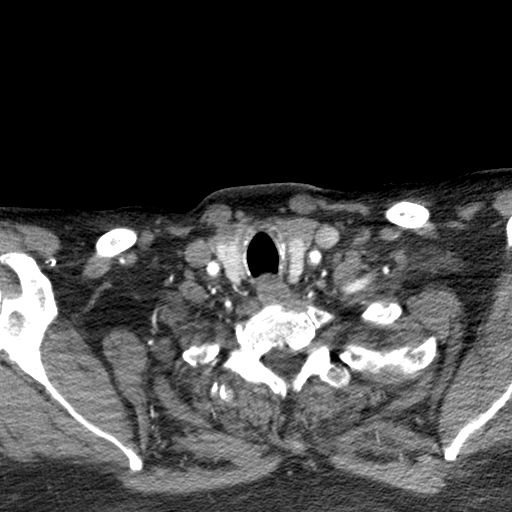
[im 81/121  soft-tissue]
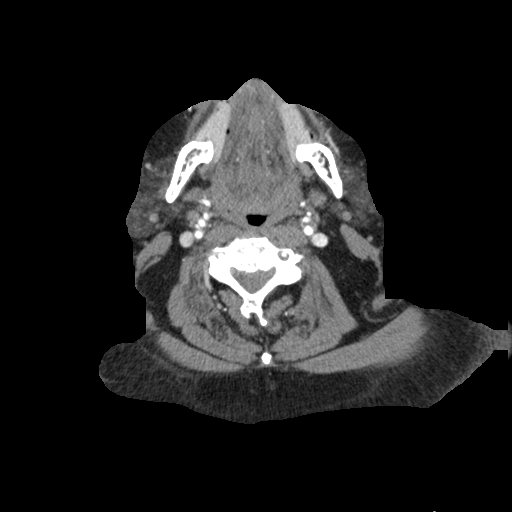

[Series 6: ax thin · axial · 0.36mm/px · z∈[-158,+17]mm · 6 of 245 slices shown]
[im 35/245  soft-tissue]
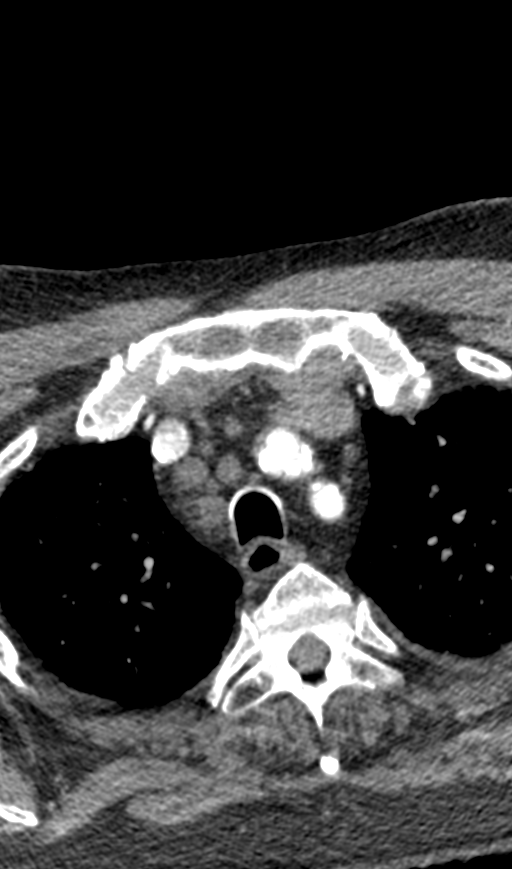
[im 70/245  bone]
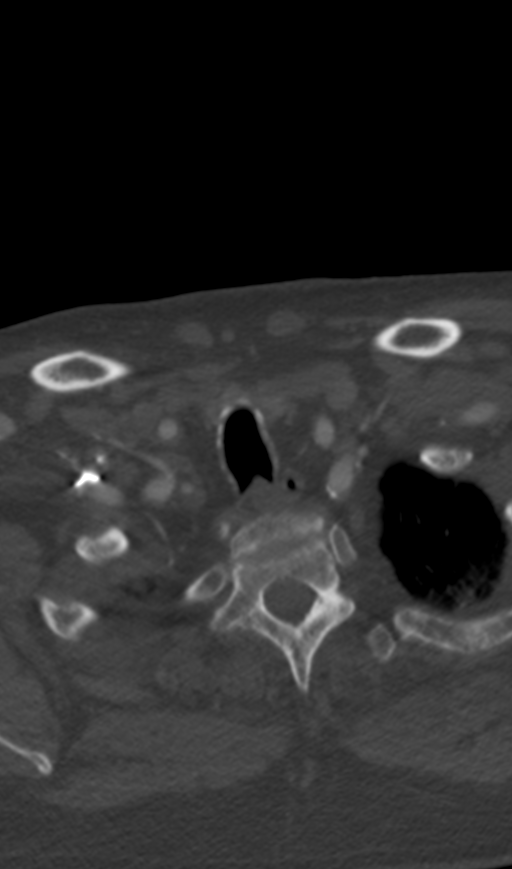
[im 105/245  soft-tissue]
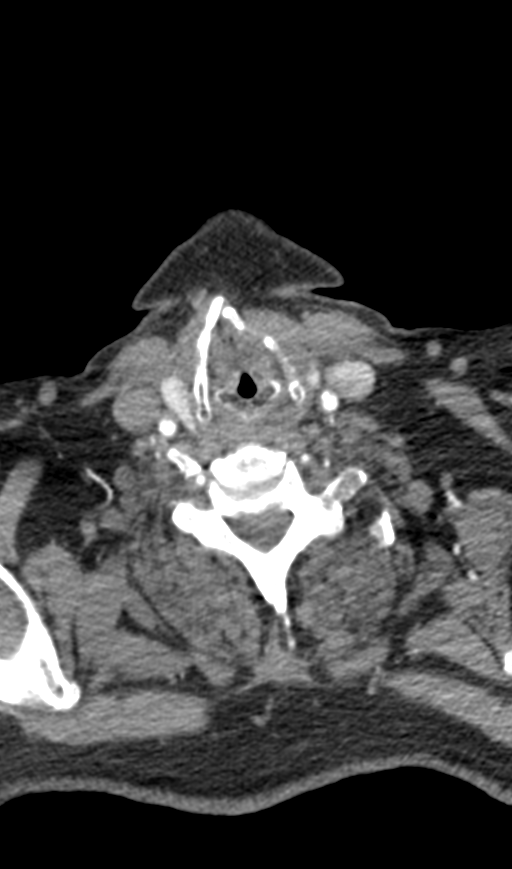
[im 140/245  bone]
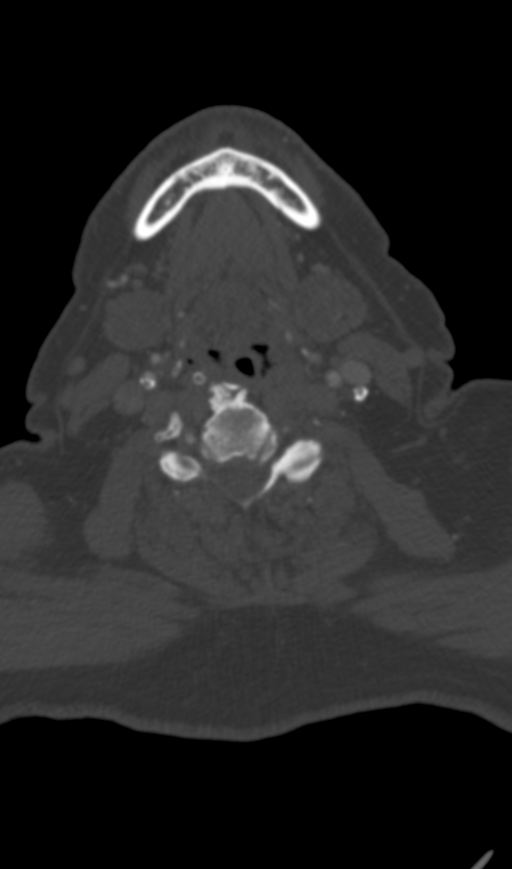
[im 175/245  soft-tissue]
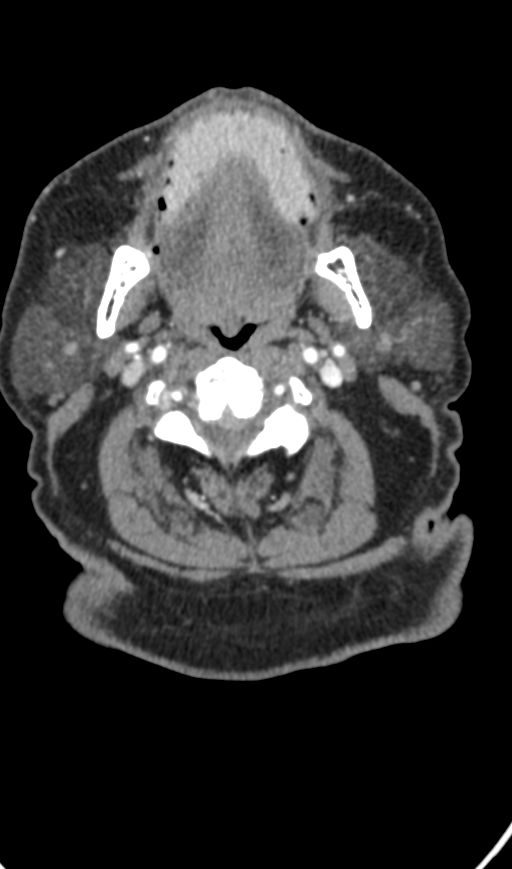
[im 210/245  bone]
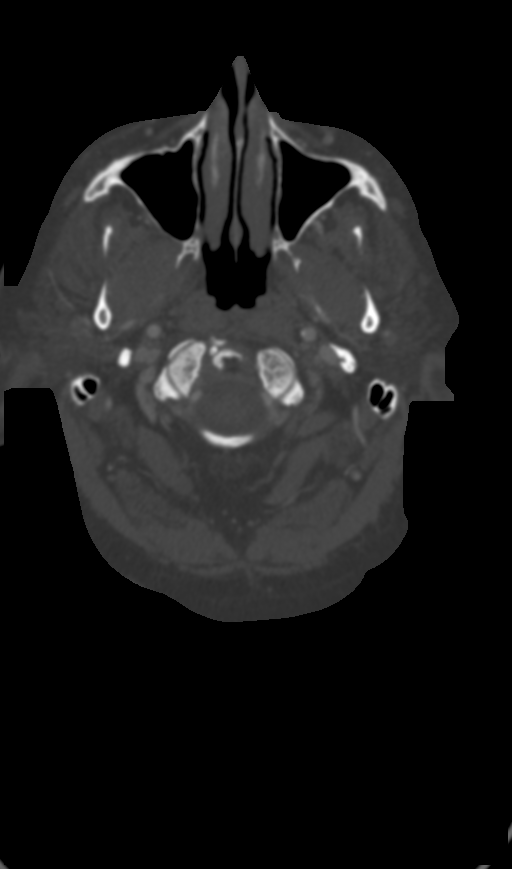

[8 of 33 positions shown; findings below may reference images not displayed]

RADIATION DOSE REDUCTION: This exam was performed according to the
departmental dose-optimization program which includes automated
exposure control, adjustment of the mA and/or kV according to
patient size and/or use of iterative reconstruction technique.

CONTRAST:  60mL OMNIPAQUE IOHEXOL 350 MG/ML SOLN
FINDINGS: Aortic arch: Two-vessel arch with a common origin of the
brachiocephalic and left common carotid arteries. Imaged portion
shows no evidence of aneurysm or dissection. Approximately 60%
stenosis at the takeoff of the left common carotid artery, secondary
to calcified and noncalcified plaque.

Right carotid system: Multifocal calcified and noncalcified plaque
in the right CCA, ECA, and ICA, which causes up to 70% luminal
narrowing in the proximal ICA. No evidence of dissection or
occlusion.

Left carotid system: Calcified and noncalcified plaque in the left
CCA, ECA, and ICA, which causes approximately 75% luminal narrowing
in the proximal ICA. No evidence of dissection or occlusion.

Vertebral arteries: Calcifications obscure the origins of the
bilateral vertebral arteries, and likely cause at least moderate
stenosis. The right vertebral artery is otherwise patent from its
origin to the skull base. The left vertebral artery is patent but
somewhat irregular and small compared to the size of the left
vertebral artery foramen. No evidence of dissection or occlusion.

Skeleton: Degenerative changes in the cervical spine. No acute
osseous abnormality.

Other neck: Negative

Upper chest: Linear atelectasis. Smooth interlobular septal
thickening. Prominent mediastinal lymph nodes, the largest of which
is a right paratracheal lymph node that measures up to 1.2 cm in
short axis, which is technically not enlarged by CT size criteria.
IMPRESSION: 1. 75% luminal narrowing in the proximal left ICA.
2. 70% luminal narrowing in the proximal right ICA.
3. 60% stenosis of the takeoff of the left common carotid artery
from the common origin of the brachiocephalic and left CCA.
4. At least moderate stenosis at the origin of the bilateral
vertebral arteries, which are obscured by calcifications. The left
vertebral artery is patent but somewhat irregular and small compared
to the size of the left vertebral artery foramen, likely secondary
to noncalcified plaque.
5. Interlobular septal thickening, which is nonspecific but can be
seen in the setting of pulmonary edema.
